# Patient Record
Sex: Female | Born: 1945 | Race: White | Hispanic: No | Smoking: Never smoker
Health system: Southern US, Community
[De-identification: ages and names within clinical notes are randomized; demographics above are authoritative.]

## PROBLEM LIST (undated history)

## (undated) DIAGNOSIS — I639 Cerebral infarction, unspecified: Secondary | ICD-10-CM

## (undated) DIAGNOSIS — R4702 Dysphasia: Secondary | ICD-10-CM

## (undated) DIAGNOSIS — I509 Heart failure, unspecified: Secondary | ICD-10-CM

## (undated) DIAGNOSIS — G0490 Encephalitis and encephalomyelitis, unspecified: Secondary | ICD-10-CM

## (undated) DIAGNOSIS — R4182 Altered mental status, unspecified: Secondary | ICD-10-CM

## (undated) DIAGNOSIS — I10 Essential (primary) hypertension: Secondary | ICD-10-CM

## (undated) DIAGNOSIS — J9600 Acute respiratory failure, unspecified whether with hypoxia or hypercapnia: Secondary | ICD-10-CM

## (undated) DIAGNOSIS — D649 Anemia, unspecified: Secondary | ICD-10-CM

## (undated) DIAGNOSIS — R569 Unspecified convulsions: Secondary | ICD-10-CM

## (undated) DIAGNOSIS — J189 Pneumonia, unspecified organism: Secondary | ICD-10-CM

## (undated) DIAGNOSIS — I4891 Unspecified atrial fibrillation: Secondary | ICD-10-CM

## (undated) DIAGNOSIS — E871 Hypo-osmolality and hyponatremia: Secondary | ICD-10-CM

## (undated) DIAGNOSIS — K589 Irritable bowel syndrome without diarrhea: Secondary | ICD-10-CM

## (undated) DIAGNOSIS — R739 Hyperglycemia, unspecified: Secondary | ICD-10-CM

## (undated) HISTORY — DX: Anemia, unspecified: D64.9

## (undated) HISTORY — DX: Dysphasia: R47.02

## (undated) HISTORY — DX: Irritable bowel syndrome, unspecified: K58.9

## (undated) HISTORY — DX: Heart failure, unspecified: I50.9

## (undated) HISTORY — DX: Acute respiratory failure, unspecified whether with hypoxia or hypercapnia: J96.00

## (undated) HISTORY — PX: TONSILLECTOMY: SUR1361

## (undated) HISTORY — DX: Altered mental status, unspecified: R41.82

## (undated) HISTORY — PX: CHOLECYSTECTOMY: SHX55

## (undated) HISTORY — PX: TUBAL LIGATION: SHX77

## (undated) HISTORY — DX: Hyperglycemia, unspecified: R73.9

## (undated) HISTORY — DX: Hypo-osmolality and hyponatremia: E87.1

## (undated) NOTE — *Deleted (*Deleted)
CARDIOLOGY CONSULT NOTE               Patient ID: [name] MRN: [id number] DOB/AGE: 12-22-1945 89 y.o.  Admit date: 03/28/2020 Referring Physician Dr. 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 Acute respiratory failure (HCC)    Secondary to aspiration pneumonia   . Altered mental status    Secondary to viral herpes simplex virus encephalitis  . Anemia   . Atrial fibrillation (HCC)   . Bilateral pneumonia   . CHF (congestive heart failure) (HCC)   . Dysphasia   . Encephalitis   . Hyperglycemia   . Hypertension   . Hyponatremia   . IBS (irritable bowel syndrome)   . Seizures (HCC)   . Stroke [facility])     Past Surgical History:  Procedure Laterality Date  . CHOLECYSTECTOMY    . COLECTOMY WITH COLOSTOMY CREATION/HARTMANN PROCEDURE N/A 03/12/2020   Procedure: COLECTOMY WITH COLOSTOMY CREATION/HARTMANN PROCEDURE;  Surgeon: Campbell Lerner, MD;  Location: [facility] ORS;  Service: General;  Laterality: N/A;  . COLONOSCOPY N/A 01/17/2020   Procedure: COLONOSCOPY;  Surgeon: Regis Bill, MD;  Location: [facility] ENDOSCOPY;  Service: Endoscopy;   Laterality: N/A;  . COLONOSCOPY WITH PROPOFOL N/A 12/31/2016   Procedure: COLONOSCOPY WITH PROPOFOL;  Surgeon: Scot Jun, MD;  Location: [facility] ENDOSCOPY;  Service: Endoscopy;  Laterality: N/A;  . ESOPHAGOGASTRODUODENOSCOPY (EGD) WITH PROPOFOL N/A 12/31/2016   Procedure: ESOPHAGOGASTRODUODENOSCOPY (EGD) WITH PROPOFOL;  Surgeon: Scot Jun, MD;  Location: [facility] ENDOSCOPY;  Service: Endoscopy;  Laterality: N/A;  . LAPAROTOMY N/A 03/12/2020   Procedure: EXPLORATORY LAPAROTOMY;  Surgeon: Campbell Lerner, MD;  Location: [facility] ORS;  Service: General;  Laterality: N/A;  . TONSILLECTOMY    . TUBAL LIGATION      Medications Prior to Admission  Medication Sig Dispense Refill Last Dose  . acetaminophen (TYLENOL) 325 MG tablet Take 650 mg by mouth every 6 (six) hours as needed.   prn at prn  . amiodarone (PACERONE) 200 MG tablet Take 1 tablet (200 mg total) by mouth daily.   Past Week at Unknown time  . amoxicillin-clavulanate (AUGMENTIN) 875-125 MG tablet Take 1 tablet by mouth 2 (two) times daily for 10 days. 20 tablet 0 Past Week at Unknown time  . apixaban (ELIQUIS) 5 MG TABS tablet Take 1 tablet (5 mg total) by mouth 2 (two) times daily. 60 tablet 0 Past Week at Unknown time  . dicyclomine (BENTYL) 10 MG capsule Take 10 mg by mouth every 6 (six) hours as needed.   prn at prn  . diltiazem (CARDIZEM CD) 240 MG 24 hr capsule Take 1 capsule (240 mg total) by mouth daily. 30 capsule 0 Past Week at Unknown time  . Ensure Max Protein (ENSURE MAX PROTEIN) LIQD Take 330 mLs (11  oz total) by mouth 2 (two) times daily.   Past Week at Unknown time  . folic acid (FOLVITE) 1 MG tablet Take 1 mg by mouth daily. Per tube once daily     Past Week at Unknown time  . furosemide (LASIX) 20 MG tablet Hold until followup with outpatient doctor, since your blood pressure is low, and you are already loosing lots fluids in your ostomy. 30 tablet  unknown at unknown  . guaiFENesin (MUCINEX) 600 MG 12 hr tablet Take 1  tablet (600 mg total) by mouth 2 (two) times daily.   Past Week at Unknown time  . hydrocortisone (ANUSOL-HC) 2.5 % rectal cream Apply 1 application topically 2 (two) times daily as needed.   prn at prn  . levothyroxine (SYNTHROID) 75 MCG tablet Take 75 mcg by mouth daily before breakfast.    Past Week at Unknown time  . metoprolol succinate (TOPROL-XL) 25 MG 24 hr tablet Take 2 tablets (50 mg total) by mouth daily.   Past Week at Unknown time  . Multiple Vitamin (MULTIVITAMIN WITH MINERALS) TABS tablet Take 1 tablet by mouth daily.   Past Week at Unknown time  . omeprazole (PRILOSEC) 20 MG capsule Take 20 mg by mouth daily.   Past Week at Unknown time  . polycarbophil (FIBERCON) 625 MG tablet Take 1 tablet (625 mg total) by mouth 3 (three) times daily.   Past Week at Unknown time  . potassium chloride (KLOR-CON) 10 MEQ tablet Take 10 mEq by mouth daily.   Past Week at Unknown time  . traMADol (ULTRAM) 50 MG tablet Take 50 mg by mouth every 6 (six) hours as needed.   prn at prn  . cephALEXin (KEFLEX) 500 MG capsule Take 500 mg by mouth 4 (four) times daily. (Patient not taking: Reported on 03/29/2020)   Not Taking at Unknown time   Social History   Socioeconomic History  . Marital status: Widowed    Spouse name: Not on file  . Number of children: 2  . Years of education: Not on file  . Highest education level: Not on file  Occupational History  . Occupation: medical transcript  Tobacco Use  . Smoking status: Never Smoker  . Smokeless tobacco: Never Used  Vaping Use  . Vaping Use: Never used  Substance and Sexual Activity  . Alcohol use: No  . Drug use: No  . Sexual activity: Never  Other Topics Concern  . Not on file  Social History Narrative  . Not on file   Social Determinants of Health   Financial Resource Strain: Low Risk   . Difficulty of Paying Living Expenses: Not hard at all  Food Insecurity: No Food Insecurity  . Worried About Programme researcher, broadcasting/film/video in the Last Year:  Never true  . Ran Out of Food in the Last Year: Never true  Transportation Needs: No Transportation Needs  . Lack of Transportation (Medical): No  . Lack of Transportation (Non-Medical): No  Physical Activity: Insufficiently Active  . Days of Exercise per Week: 2 days  . Minutes of Exercise per Session: 30 min  Stress: No Stress Concern Present  . Feeling of Stress : Not at all  Social Connections: Moderately Isolated  . Frequency of Communication with Friends and Family: More than three times a week  . Frequency of Social Gatherings with Friends and Family: More than three times a week  . Attends Religious Services: 1 to 4 times per year  . Active Member of  Clubs or Organizations: No  . Attends Banker Meetings: Never  . Marital Status: Widowed  Intimate Partner Violence: Not At Risk  . Fear of Current or Ex-Partner: No  . Emotionally Abused: No  . Physically Abused: No  . Sexually Abused: No    Family History  Problem Relation Age of Onset  . Prostate cancer Father   . Breast cancer Maternal Aunt   . Breast cancer Maternal Aunt   . Cerebral palsy Son       Review of systems complete and found to be negative unless listed above      PHYSICAL EXAM  General: Well developed, well nourished, in no acute distress HEENT:  Normocephalic and atramatic Neck:  No JVD.  Lungs: Clear bilaterally to auscultation and percussion. Heart: HRRR . Normal S1 and S2 without gallops or murmurs.  Abdomen: Bowel sounds are positive, abdomen soft and non-tender  Msk:  Back normal, normal gait. Normal strength and tone for age. Extremities: No clubbing, cyanosis or edema.   Neuro: Alert and oriented X 3. Psych:  Good affect, responds appropriately  Labs:   Lab Results  Component Value Date   WBC 10.4 03/30/2020   HGB 9.6 (L) 03/30/2020   HCT 29.6 (L) 03/30/2020   MCV 82.9 03/30/2020   PLT 267 03/30/2020    Recent Labs  Lab 03/28/20 2150 03/29/20 0431 03/30/20 0414   NA 129*   < > 135  K 4.3   < > 3.7  CL 98   < > 105  CO2 18*   < > 22  BUN 13   < > 8  CREATININE 0.53   < > 0.56  CALCIUM 8.4*   < > 8.1*  PROT 6.9  --   --   BILITOT 0.7  --   --   ALKPHOS 112  --   --   ALT 40  --   --   AST 41  --   --   GLUCOSE 84   < > 112*   < > = values in this interval not displayed.   Lab Results  Component Value Date   CKTOTAL 70 08/29/2011   CKMB 3.3 08/29/2011   TROPONINI 0.07 (HH) 07/05/2017    Lab Results  Component Value Date   CHOL 159 03/17/2019   CHOL 222 (H) 08/28/2011   Lab Results  Component Value Date   HDL 46 03/17/2019   HDL 55 08/28/2011   Lab Results  Component Value Date   LDLCALC 92 03/17/2019   LDLCALC 142 (H) 08/28/2011   Lab Results  Component Value Date   TRIG 103 03/17/2019   TRIG 127 08/28/2011   Lab Results  Component Value Date   CHOLHDL 3.5 03/17/2019   No results found for: LDLDIRECT    Radiology: DG Chest 1 View  Result Date: 03/13/2020 CLINICAL DATA:  Tachycardia EXAM: CHEST  1 VIEW COMPARISON:  02/22/2020 FINDINGS: There is cardiomegaly with small pleural effusions. No overt pulmonary edema. IMPRESSION: Cardiomegaly with small pleural effusions. Electronically Signed   By: Deatra Robinson M.D.   On: 03/13/2020 03:27   DG Chest 2 View  Result Date: 03/19/2020 CLINICAL DATA:  Pneumonia. EXAM: CHEST - 2 VIEW COMPARISON:  March 13, 2020. FINDINGS: Stable cardiomegaly. No pneumothorax is noted. Right-sided PICC line is unchanged in position. Mild bilateral pleural effusions are noted, right greater than left. Probable bibasilar atelectasis is noted as well. Bony thorax is unremarkable. IMPRESSION: Mild bilateral pleural effusions, right  greater than left. Probable bibasilar atelectasis. Electronically Signed   By: Lupita Raider M.D.   On: 03/19/2020 13:12   CT Angio Chest PE W/Cm &/Or Wo Cm  Result Date: 03/29/2020 CLINICAL DATA:  Chest pain with shortness of breath. History of bowel obstruction with  surgery times 2 weeks. Recent infection. Staples removed yesterday. EXAM: CT ANGIOGRAPHY CHEST CT ABDOMEN AND PELVIS WITH CONTRAST TECHNIQUE: Multidetector CT imaging of the chest was performed using the standard protocol during bolus administration of intravenous contrast. Multiplanar CT image reconstructions and MIPs were obtained to evaluate the vascular anatomy. Multidetector CT imaging of the abdomen and pelvis was performed using the standard protocol during bolus administration of intravenous contrast. CONTRAST:  OMNIPAQUE IOHEXOL 350 MG/ML SOLN COMPARISON:  CT dated 03/25/2020 FINDINGS: CTA CHEST FINDINGS Cardiovascular: Contrast injection is sufficient to demonstrate satisfactory opacification of the pulmonary arteries to the segmental level. There is no pulmonary embolus or evidence of right heart strain. The size of the main pulmonary artery is normal. There is significant cardiomegaly with biatrial enlargement. Mediastinum/Nodes: -- No mediastinal lymphadenopathy. -- No hilar lymphadenopathy. -- No axillary lymphadenopathy. -- No supraclavicular lymphadenopathy. -- Normal thyroid gland where visualized. -  Unremarkable esophagus. Lungs/Pleura: There are trace bilateral pleural effusions. There is atelectasis at the lung bases. There is mild interlobular septal thickening. There is no pneumothorax. The trachea is unremarkable. Musculoskeletal: No chest wall abnormality. No bony spinal canal stenosis. CT ABDOMEN and PELVIS FINDINGS Hepatobiliary: Low-attenuation nodules are again noted throughout the patient's left and right hepatic lobes as before. Status post cholecystectomy.There is mild intrahepatic biliary ductal dilatation, unchanged from prior study. Pancreas: Normal contours without ductal dilatation. No peripancreatic fluid collection. Spleen: Unremarkable. Adrenals/Urinary Tract: --Adrenal glands: Unremarkable. --Right kidney/ureter: No hydronephrosis or radiopaque kidney stones. --Left  kidney/ureter: No hydronephrosis or radiopaque kidney stones. --Urinary bladder: Unremarkable. Stomach/Bowel: --Stomach/Duodenum: No hiatal hernia or other gastric abnormality. Normal duodenal course and caliber. --Small bowel: There are mildly dilated hyperenhancing loops of small bowel in the left mid abdomen. There is a right lower quadrant ileostomy without evidence for obstruction. --Colon: Patient is status post subtotal colectomy. --Appendix: Surgically absent. Vascular/Lymphatic: Atherosclerotic calcification is present within the non-aneurysmal abdominal aorta, without hemodynamically significant stenosis. --No retroperitoneal lymphadenopathy. --No mesenteric lymphadenopathy. --No pelvic or inguinal lymphadenopathy. Reproductive: There is a probable fibroid uterus. Other: There is a fat containing lesion abutting the dome of the urinary bladder measuring approximately 2.7 cm. This is somewhat similar cross prior studies and may represent an area of fat necrosis. There is a midline abdominal wall incision that appears open. Inferiorly there is packing material. Superior to the umbilicus, there is a somewhat ill-defined 6.3 by 1.6 by 0.6 cm fluid collection deep to the midline incision. There is no rim enhancement of this collection. There is a trace amount of free fluid in the abdomen. There is no free air. Musculoskeletal. No acute displaced fractures. Review of the MIP images confirms the above findings. IMPRESSION: 1. No acute pulmonary embolism. 2. Trace bilateral pleural effusions with atelectasis. 3. Cardiomegaly with biatrial enlargement. There are findings of mild interstitial edema. 4. Mildly dilated hyperenhancing loops of small bowel in the left mid abdomen may represent ileus or enteritis. 5. Fluid collection deep to the midline incision superior to the umbilicus as detailed above. This is favored to represent a postoperative seroma or hematoma but has increased in size from prior study. A  developing abscess is not excluded. 6. Trace amount of free fluid in the  abdomen. 7. Fibroid uterus. 8. Fat containing lesion abutting the dome of the urinary bladder favored to represent an area of fat necrosis. Aortic Atherosclerosis (ICD10-I70.0). Electronically Signed   By: Katherine Mantle M.D.   On: 03/29/2020 00:03   CT Abdomen Pelvis W Contrast  Result Date: 03/29/2020 CLINICAL DATA:  Chest pain with shortness of breath. History of bowel obstruction with surgery times 2 weeks. Recent infection. Staples removed yesterday. EXAM: CT ANGIOGRAPHY CHEST CT ABDOMEN AND PELVIS WITH CONTRAST TECHNIQUE: Multidetector CT imaging of the chest was performed using the standard protocol during bolus administration of intravenous contrast. Multiplanar CT image reconstructions and MIPs were obtained to evaluate the vascular anatomy. Multidetector CT imaging of the abdomen and pelvis was performed using the standard protocol during bolus administration of intravenous contrast. CONTRAST:  OMNIPAQUE IOHEXOL 350 MG/ML SOLN COMPARISON:  CT dated 03/25/2020 FINDINGS: CTA CHEST FINDINGS Cardiovascular: Contrast injection is sufficient to demonstrate satisfactory opacification of the pulmonary arteries to the segmental level. There is no pulmonary embolus or evidence of right heart strain. The size of the main pulmonary artery is normal. There is significant cardiomegaly with biatrial enlargement. Mediastinum/Nodes: -- No mediastinal lymphadenopathy. -- No hilar lymphadenopathy. -- No axillary lymphadenopathy. -- No supraclavicular lymphadenopathy. -- Normal thyroid gland where visualized. -  Unremarkable esophagus. Lungs/Pleura: There are trace bilateral pleural effusions. There is atelectasis at the lung bases. There is mild interlobular septal thickening. There is no pneumothorax. The trachea is unremarkable. Musculoskeletal: No chest wall abnormality. No bony spinal canal stenosis. CT ABDOMEN and PELVIS FINDINGS  Hepatobiliary: Low-attenuation nodules are again noted throughout the patient's left and right hepatic lobes as before. Status post cholecystectomy.There is mild intrahepatic biliary ductal dilatation, unchanged from prior study. Pancreas: Normal contours without ductal dilatation. No peripancreatic fluid collection. Spleen: Unremarkable. Adrenals/Urinary Tract: --Adrenal glands: Unremarkable. --Right kidney/ureter: No hydronephrosis or radiopaque kidney stones. --Left kidney/ureter: No hydronephrosis or radiopaque kidney stones. --Urinary bladder: Unremarkable. Stomach/Bowel: --Stomach/Duodenum: No hiatal hernia or other gastric abnormality. Normal duodenal course and caliber. --Small bowel: There are mildly dilated hyperenhancing loops of small bowel in the left mid abdomen. There is a right lower quadrant ileostomy without evidence for obstruction. --Colon: Patient is status post subtotal colectomy. --Appendix: Surgically absent. Vascular/Lymphatic: Atherosclerotic calcification is present within the non-aneurysmal abdominal aorta, without hemodynamically significant stenosis. --No retroperitoneal lymphadenopathy. --No mesenteric lymphadenopathy. --No pelvic or inguinal lymphadenopathy. Reproductive: There is a probable fibroid uterus. Other: There is a fat containing lesion abutting the dome of the urinary bladder measuring approximately 2.7 cm. This is somewhat similar cross prior studies and may represent an area of fat necrosis. There is a midline abdominal wall incision that appears open. Inferiorly there is packing material. Superior to the umbilicus, there is a somewhat ill-defined 6.3 by 1.6 by 0.6 cm fluid collection deep to the midline incision. There is no rim enhancement of this collection. There is a trace amount of free fluid in the abdomen. There is no free air. Musculoskeletal. No acute displaced fractures. Review of the MIP images confirms the above findings. IMPRESSION: 1. No acute pulmonary  embolism. 2. Trace bilateral pleural effusions with atelectasis. 3. Cardiomegaly with biatrial enlargement. There are findings of mild interstitial edema. 4. Mildly dilated hyperenhancing loops of small bowel in the left mid abdomen may represent ileus or enteritis. 5. Fluid collection deep to the midline incision superior to the umbilicus as detailed above. This is favored to represent a postoperative seroma or hematoma but has increased in size from prior  study. A developing abscess is not excluded. 6. Trace amount of free fluid in the abdomen. 7. Fibroid uterus. 8. Fat containing lesion abutting the dome of the urinary bladder favored to represent an area of fat necrosis. Aortic Atherosclerosis (ICD10-I70.0). Electronically Signed   By: Katherine Mantle M.D.   On: 03/29/2020 00:03   CT ABDOMEN PELVIS W CONTRAST  Result Date: 03/25/2020 CLINICAL DATA:  Postop 1 week subtotal colectomy. Purulence drainage from wound. Evaluate for abscess. colectomy for bowel obstruction. EXAM: CT ABDOMEN AND PELVIS WITH CONTRAST TECHNIQUE: Multidetector CT imaging of the abdomen and pelvis was performed using the standard protocol following bolus administration of intravenous contrast. CONTRAST:  OMNIPAQUE IOHEXOL 300 MG/ML  SOLN COMPARISON:  CT 03/19/2020 FINDINGS: Lower chest: Improving bilateral pleural effusions. Moderate effusion remains on the RIGHT. Near complete resolution of LEFT effusion. There is passive atelectasis in the RIGHT lower lobe. Hepatobiliary: Several hypodense lesions in liver unchanged consistent with cysts. Postcholecystectomy. Pancreas: Pancreas is normal. No ductal dilatation. No pancreatic inflammation. Spleen: Normal spleen Adrenals/urinary tract: Adrenal glands and kidneys are normal. The ureters and bladder normal. Stomach/Bowel: Stomach is distended with oral contrast. There is mild dilatation of the proximal small bowel 3.2 cm similar comparison CT. Contrast flows in the mid small  bowel but does not reach the ileostomy in the course of the study. There is no transition point. RIGHT abdominal wall ileostomy without complicating features. Vascular/Lymphatic: Abdominal aorta is normal caliber with atherosclerotic calcification. There is no retroperitoneal or periportal lymphadenopathy. No pelvic lymphadenopathy. Reproductive: Uterus normal Other: Interval reduction in intraperitoneal free fluid seen on comparison exam. Small amount fluid remains in the RIGHT abdominal peritoneal space. Near complete resolution of fluid within the pelvis. Small subcutaneous fluid collection along the inferior margin of the surgical wound staples is increased in diameter compared to prior. This triangular collection measures 2.3 x 3.0 cm compared to 1.2 x 1.5 cm on comparison exam. This collection is superficial to the muscle layer does not appear to communicate with the peritoneal space. Musculoskeletal: No aggressive osseous lesion. IMPRESSION: 1. Increased organization of fluid collection along the inferior margin of the midline ventral wound. This collection does not appear to communicate with the peritoneal space. 2. Persistent distended stomach and dilated proximal small bowel with no obstructing lesion or transition point. Findings continue to suggest adynamic ileus. No evidence obstruction of the RIGHT lower quadrant ileostomy. 3. Interval decrease in intraperitoneal free fluid. 4. Interval decrease in pleural effusions. Electronically Signed   By: Genevive Bi M.D.   On: 03/25/2020 18:45   CT ABDOMEN PELVIS W CONTRAST  Result Date: 03/19/2020 CLINICAL DATA:  Worsening leukocytosis 1 week following subtotal colectomy. Abdominal abscess/infection suspected. EXAM: CT ABDOMEN AND PELVIS WITH CONTRAST TECHNIQUE: Multidetector CT imaging of the abdomen and pelvis was performed using the standard protocol following bolus administration of intravenous contrast. CONTRAST:  OMNIPAQUE IOHEXOL 300  MG/ML  SOLN COMPARISON:  Abdominopelvic CT 03/11/2020 and 12/21/2019. FINDINGS: Lower chest: Slight interval enlargement of moderate right and small left pleural effusions with associated worsening atelectasis dependently in both lung bases. Stable cardiomegaly and coronary artery atherosclerosis. No significant pericardial fluid. Hepatobiliary: Stable appendix cysts. Stable mild extrahepatic biliary dilatation post cholecystectomy. Pancreas: Unremarkable. No pancreatic ductal dilatation or surrounding inflammatory changes. Spleen: Normal in size without focal abnormality. Adrenals/Urinary Tract: Both adrenal glands appear normal. Stable mild bilateral renal cortical thinning. No renal mass, urinary tract calculus or hydronephrosis. The bladder appears normal. Stomach/Bowel: Enteric contrast was administered and  extends to the ileostomy in the right mid abdominal wall. There is mild diffuse dilatation of the stomach and small bowel without focal wall thickening or enteric contrast extravasation. Subtotal colectomy has been performed without evidence of suture breakdown at the [facility]. Vascular/Lymphatic: There are no enlarged abdominal or pelvic lymph nodes. Aortic and branch vessel atherosclerosis. The portal, superior mesenteric and splenic veins are patent. Reproductive: Enhancing mass anteriorly in the lower uterine segment measuring 4.4 x 3.1 cm on image 81/2, grossly stable from prior CTs and consistent with an anterior fibroid. No suspicious adnexal findings. Other: Small amount of pelvic and interloop ascites without suspicious peritoneal enhancement or focal extraluminal fluid collection. No free intraperitoneal air. Musculoskeletal: No acute or significant osseous findings. IMPRESSION: 1. Interval subtotal colectomy and ileostomy. Mild diffuse dilatation of the stomach and small bowel without focal wall thickening or enteric contrast extravasation, suggesting adynamic ileus. 2. Small amount of pelvic  and interloop ascites without suspicious peritoneal enhancement or focal extraluminal fluid collection. 3. Slight interval enlargement of right greater than left pleural effusions with associated worsening atelectasis dependently in both lung bases. 4. Uterine fibroid. 5. Aortic Atherosclerosis (ICD10-I70.0). Electronically Signed   By: Carey Bullocks M.D.   On: 03/19/2020 12:20   CT ABDOMEN PELVIS W CONTRAST  Result Date: 03/11/2020 CLINICAL DATA:  50 year old female with abdominal distension. EXAM: CT ABDOMEN AND PELVIS WITH CONTRAST TECHNIQUE: Multidetector CT imaging of the abdomen and pelvis was performed using the standard protocol following bolus administration of intravenous contrast. CONTRAST:  OMNIPAQUE IOHEXOL 300 MG/ML  SOLN COMPARISON:  CT abdomen pelvis dated 12/21/2019. FINDINGS: Lower chest: Trace left and small right pleural effusions. Patchy right lung base consolidation, likely atelectasis. Pneumonia is not excluded clinical correlation is recommended. There is moderate cardiomegaly with biatrial dilatation. There is retrograde flow of contrast from the right atrium into the IVC suggestive of a degree of right heart dysfunction. There is calcification of the mitral annulus. No intra-abdominal free air. Trace free fluid in the pelvis. Hepatobiliary: There is mild irregularity of the liver contour suggestive of early changes of cirrhosis. Bilobed or 2 adjacent cysts in the left lobe of the liver with combined dimension of 2 cm. No intrahepatic biliary ductal dilatation. Cholecystectomy. No retained calcified stone noted in the central CBD. Pancreas: No acute findings. Spleen: Normal in size without focal abnormality. Adrenals/Urinary Tract: The adrenal glands unremarkable. Mild bilateral parenchyma atrophy. There is no hydronephrosis on either side. There is symmetric enhancement and excretion of contrast by both kidneys. The visualized ureters and urinary bladder appear unremarkable.  Stomach/Bowel: There is sigmoid diverticulosis with muscular hypertrophy. There is moderate diffuse colonic distension with air extending to the level of the sigmoid colon suggestive of a degree of obstruction of the sigmoid colon, likely related to muscular hypertrophy or stricture. Underlying mass is not excluded. Clinical correlation and follow-up recommended. Scattered tiny pockets of air along the wall of the cecum and proximal colon likely mixed with the colonic content and less likely pneumatosis. Correlation with lactic acid recommended to exclude bowel ischemia. There is a small hiatal hernia. No evidence of small-bowel obstruction. Vascular/Lymphatic: Mild aortoiliac atherosclerotic disease. The IVC is unremarkable. No portal venous gas. There is no adenopathy. Reproductive: The uterus is grossly unremarkable. Other: Small fat containing umbilical hernia. Musculoskeletal: No acute or significant osseous findings. IMPRESSION: 1. Moderate diffuse colonic distension likely related to a degree of obstruction of the sigmoid colon, secondary to muscular hypertrophy or stricture. Underlying mass is not  excluded. Clinical correlation and follow-up recommended. 2. Sigmoid diverticulosis with muscular hypertrophy. No active inflammatory changes. 3. Moderate cardiomegaly with biatrial dilatation. 4. Trace left and small right pleural effusions. Patchy right lung base consolidation, likely atelectasis. Pneumonia is not excluded. 5. Aortic Atherosclerosis (ICD10-I70.0). Electronically Signed   By: Elgie Collard M.D.   On: 03/11/2020 19:45   DG Chest Port 1 View  Result Date: 03/28/2020 CLINICAL DATA:  Questionable sepsis, sharp 5/10 chest pain, shortness of breath history of bowel obstruction, now post subtotal colectomy EXAM: PORTABLE CHEST 1 VIEW COMPARISON:  Radiograph 03/19/2020 FINDINGS: There is diffuse hazy interstitial opacity in the lungs with a mid to lower lung predominance and some faint septal  thickening as well as central vascular congestion. More hazy opacities present in the right infrahilar lung. Small bilateral effusions are present, likely diminished from comparison accounting for differences in technique. No pneumothorax. Cardiomegaly similar to prior counting for differences in technique. The osseous structures appear diffusely demineralized which may limit detection of small or nondisplaced fractures. No acute osseous abnormality or suspicious osseous lesion. Degenerative changes are present in the imaged spine and shoulders. Soft tissues unremarkable. Telemetry leads overlie the chest. IMPRESSION: 1. Appearance could suggest some mild CHF/volume overload with cardiomegaly and features of interstitial with trace bilateral effusions. 2. More hazy opacity in the right infrahilar lung is favored to reflect some developing alveolar edema though early airspace disease is not excluded in the setting of sepsis. Electronically Signed   By: Kreg Shropshire M.D.   On: 03/28/2020 22:16   DG Chest Port 1 View  Result Date: 03/13/2020 CLINICAL DATA:  Dyspnea EXAM: PORTABLE CHEST 1 VIEW COMPARISON:  03/14/2019 FINDINGS: Cardiac shadow remains enlarged. Aortic calcifications are again seen. Small effusions are noted bilaterally right slightly greater than left. Mild left basilar atelectasis is seen. Mild central vascular congestion is noted as well. IMPRESSION: Small effusions right greater than left. Mild left basilar atelectasis is seen. Mild vascular congestion is noted as well. Electronically Signed   By: Alcide Clever M.D.   On: 03/13/2020 18:05   DG Abd 2 Views  Result Date: 03/11/2020 CLINICAL DATA:  Abdominal pain EXAM: ABDOMEN - 2 VIEW COMPARISON:  December 12, 2019 FINDINGS: There is marked gaseous dilation of loops colon. The cecum measures approximately 13 cm. No definitive free air. There are nonspecific air-fluid levels on upright radiographs. Small amount of air seen within the rectum.  Bibasilar heterogeneous opacities, nonspecific. Surgical clips project over the upper abdomen. Degenerative changes of the lower lumbar spine. IMPRESSION: 1. Marked gaseous dilation of loops of colon. Small amount of air seen within the rectum. Findings are concerning for colonic obstruction versus ileus. Recommend further evaluation with dedicated contrast enhanced CT. 2.  Bibasilar heterogeneous opacities, nonspecific. Electronically Signed   By: Meda Klinefelter MD   On: 03/11/2020 15:37   DG BE (COLON)W SINGLE CM (SOL OR THIN BA)  Result Date: 03/12/2020 CLINICAL DATA:  Evaluate for colonic obstruction EXAM: SINGLE CONTRAST ENEMA TECHNIQUE: Initial scout AP supine abdominal image obtained. Contrast was introduced into the colon in a retrograde fashion and spot images were obtained. Dilute Omnipaque 300 contrast media was used. FLUOROSCOPY TIME:  Fluoroscopy Time:  1.2 minutes Radiation Exposure Index (if provided by the fluoroscopic device): 28.4 mGy Number of Acquired Spot Images: 12 COMPARISON:  CT 03/11/2020 FINDINGS: Single contrast enema was performed utilizing dilute Omnipaque 300. Contrast filled the rectum and distal sigmoid colon. There is abrupt luminal obstruction the level of the  mid to distal sigmoid colon corresponding to site of high-grade luminal narrowing seen on CT. No contrast was able to traverse this level. No extraluminal contrast was visualized. Patient tolerated the procedure well. IMPRESSION: Complete colonic obstruction at the level of the mid to distal sigmoid colon. These results were called by telephone at the conclusion of the exam on 03/12/2020 at 10:53 a.m. to provider Lynden Oxford, PA, who verbally acknowledged these results. Electronically Signed   By: Duanne Guess D.O.   On: 03/12/2020 11:39   [place] EKG SITE RITE  Result Date: 03/13/2020 If Site Rite image not attached, placement could not be confirmed due to current cardiac rhythm.   EKG: Atrial fibrillation  with controlled  ASSESSMENT AND PLAN:  Atrial fibrillation Postop abdominal surgery Wound infection History of diastolic heart failure Hypertension Seizure disorder Previous CVA history of pneumonia . Plan 1 Sepsis therapy with antibiotics for postop wound infection 2 low-grade temp recommend medical therapy 3 hypotension will consider midodrine 4 agree with DVT prophylaxis 5 consider ID involvement for sepsis therapy 6 continue therapy for chronic diastolic congestive heart failure 7 recommend correct electrolytes 8 consider adding digoxin if additional rate control necessary  Signed: Alwyn Pea MD 03/30/2020, 3:36 PM

## (undated) NOTE — *Deleted (*Deleted)
[facility] Emergency Department Provider Note  ____________________________________________  Time seen: Approximately 11:42 PM  I have reviewed the triage vital signs and the nursing notes.   HISTORY  Chief Complaint Shortness of Breath and Chest Pain   HPI [name] is a 8 y.o. female  with history of A. 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 Patient recently admitted for severe sepsis from 10/27 to 11/2. Patient has noticed swelling of bilateral LE and feet. This evening as she laid down to sleep she started to feel SOB and developed chest tightness. No fever, cough, abd pain, N/V/D. SOB is better with her sitting up.   PMH A. Fib HTN diastolic CHF Colectomy diverticulitis  Allergies Patient has no allergy information on record.  FH Father (Deceased)  Prostate cancer  Maternal Aunt Breast cancer  Maternal Aunt Breast cancer  Son Cerebral palsy     Social History Tobacco Use  . Smoking status: Never Smoker  . Smokeless tobacco: Never Used  Vaping Use  . Vaping Use: Never used  Substance Use Topics  . Alcohol use: No  . Drug use: No     Review of Systems  Constitutional: Negative for fever. Eyes: Negative for visual changes. ENT: Negative for sore throat. Neck: No neck pain  Cardiovascular: Negative for chest pain. + chest tightness Respiratory: + shortness of breath. Gastrointestinal: Negative for abdominal pain, vomiting or diarrhea. Genitourinary: Negative for dysuria. Musculoskeletal: Negative for back pain. Skin: Negative for rash. Neurological: Negative for headaches, weakness or numbness. Psych: No SI or HI  ____________________________________________   PHYSICAL EXAM:  VITAL  SIGNS: *** Constitutional: Alert and oriented. Well appearing and in no apparent distress. HEENT:      Head: Normocephalic and atraumatic.         Eyes: Conjunctivae are normal. Sclera is non-icteric.       Mouth/Throat: Mucous membranes are moist.       Neck: Supple with no signs of meningismus. Cardiovascular: Irregularly irregular rhythm with mildly tachycardic rate Respiratory: Normal respiratory effort. Lungs are clear to auscultation bilaterally. No wheezes, crackles, or rhonchi.  Gastrointestinal: Soft, non tender, and non distended. Surgical wound looks well Musculoskeletal: 2+ pitting edema of b/l LE Neurologic: Normal speech and language. Face is symmetric. Moving all extremities. No gross focal neurologic deficits are appreciated. Skin: Skin is warm, dry and intact. No rash noted. Psychiatric: Mood and affect are normal. Speech and behavior are normal.  ____________________________________________   LABS (all labs ordered are listed, but only abnormal results are displayed)  Labs Reviewed  CBC WITH DIFFERENTIAL/PLATELET  COMPREHENSIVE METABOLIC PANEL  BRAIN NATRIURETIC PEPTIDE  MAGNESIUM  URINALYSIS, COMPLETE (UACMP) WITH MICROSCOPIC  TROPONIN I (HIGH SENSITIVITY)   ____________________________________________  EKG  ***  ____________________________________________  RADIOLOGY  I have personally reviewed the images performed during this visit and I agree with the Radiologist's read.   Interpretation by Radiologist:  No results found.    ____________________________________________   PROCEDURES  Procedure(s) performed:yes *** Procedures Critical Care performed: *** None ____________________________________________   INITIAL IMPRESSION / ASSESSMENT AND PLAN / ED COURSE  20 y.o. female  with history of A. fib on Eliquis and amiodarone, HTN, diastolic CHF, status post recent subtotal colectomy on 03/12/20 for colonic obstruction from stricture from  diverticulitis, with postop complication of wound  infection who [facility] [employer] for SOB and chest tightness.  Patient arrives in A. fib with rate in the low 100s, afebrile with normal blood pressure.  Old medical records reviewed showing recent admission for almost a week in the setting of severe sepsis of unknown source for which she received a large amount of fluids.  She does look volume overloaded with edema in her bilateral lower extremity.  Differential diagnoses including CHF exacerbation versus viral illness versus pneumonia versus ACS versus PE versus sepsis.  Patient placed on telemetry for close monitoring.  Will get CBC, CMP, troponin, BNP, EKG, chest x-ray, urinalysis, Covid/flu swab.       _____________________________________________ Please note:  Patient was evaluated in Emergency Department today for the symptoms described in the history of present illness. Patient was evaluated in the context of the global COVID-19 pandemic, which necessitated consideration that the patient might be at risk for infection with the SARS-CoV-2 virus that causes COVID-19. Institutional protocols and algorithms that pertain to the evaluation of patients at risk for COVID-19 are in a state of rapid change based on information released by regulatory bodies including the CDC and federal and state organizations. These policies and algorithms were followed during the patient's care in the ED.  Some ED evaluations and interventions may be delayed as a result of limited staffing during the pandemic.   [place] Controlled Substance Database was reviewed by me. ____________________________________________   FINAL CLINICAL IMPRESSION(S) / ED DIAGNOSES   Final diagnoses:  None      NEW MEDICATIONS STARTED DURING THIS VISIT:  ED Discharge Orders    None       Note:  This document was prepared using Dragon voice recognition software and may include unintentional dictation errors.

---

## 2002-06-09 ENCOUNTER — Encounter: Admission: RE | Admit: 2002-06-09 | Discharge: 2002-06-09 | Payer: Self-pay

## 2005-02-05 ENCOUNTER — Ambulatory Visit: Payer: Self-pay | Admitting: Internal Medicine

## 2006-02-17 ENCOUNTER — Ambulatory Visit: Payer: Self-pay | Admitting: Internal Medicine

## 2006-11-10 ENCOUNTER — Ambulatory Visit: Payer: Self-pay | Admitting: Internal Medicine

## 2006-11-12 ENCOUNTER — Ambulatory Visit: Payer: Self-pay | Admitting: Internal Medicine

## 2007-08-19 ENCOUNTER — Ambulatory Visit: Payer: Self-pay | Admitting: Gastroenterology

## 2007-09-03 ENCOUNTER — Ambulatory Visit: Payer: Self-pay | Admitting: Gastroenterology

## 2008-03-22 ENCOUNTER — Ambulatory Visit: Payer: Self-pay | Admitting: Internal Medicine

## 2009-04-17 ENCOUNTER — Ambulatory Visit: Payer: Self-pay | Admitting: Internal Medicine

## 2010-05-14 ENCOUNTER — Ambulatory Visit: Payer: Self-pay | Admitting: Internal Medicine

## 2010-12-29 ENCOUNTER — Emergency Department: Payer: Self-pay | Admitting: Internal Medicine

## 2010-12-30 ENCOUNTER — Emergency Department: Payer: Self-pay | Admitting: Unknown Physician Specialty

## 2010-12-31 ENCOUNTER — Encounter: Payer: Self-pay | Admitting: Internal Medicine

## 2010-12-31 ENCOUNTER — Emergency Department (HOSPITAL_COMMUNITY): Payer: Medicare Other

## 2010-12-31 ENCOUNTER — Inpatient Hospital Stay (HOSPITAL_COMMUNITY): Payer: Medicare Other

## 2010-12-31 ENCOUNTER — Inpatient Hospital Stay (HOSPITAL_COMMUNITY)
Admission: EM | Admit: 2010-12-31 | Discharge: 2011-02-05 | DRG: 003 | Disposition: A | Discharge status: Skilled Nursing Facility | Payer: Medicare Other | Attending: Pulmonary Disease | Admitting: Pulmonary Disease

## 2010-12-31 DIAGNOSIS — N390 Urinary tract infection, site not specified: Secondary | ICD-10-CM | POA: Diagnosis present

## 2010-12-31 DIAGNOSIS — R5381 Other malaise: Secondary | ICD-10-CM | POA: Diagnosis present

## 2010-12-31 DIAGNOSIS — I498 Other specified cardiac arrhythmias: Secondary | ICD-10-CM | POA: Diagnosis not present

## 2010-12-31 DIAGNOSIS — J9600 Acute respiratory failure, unspecified whether with hypoxia or hypercapnia: Secondary | ICD-10-CM | POA: Diagnosis not present

## 2010-12-31 DIAGNOSIS — E236 Other disorders of pituitary gland: Secondary | ICD-10-CM | POA: Diagnosis present

## 2010-12-31 DIAGNOSIS — R4789 Other speech disturbances: Secondary | ICD-10-CM | POA: Diagnosis present

## 2010-12-31 DIAGNOSIS — J690 Pneumonitis due to inhalation of food and vomit: Secondary | ICD-10-CM | POA: Diagnosis present

## 2010-12-31 DIAGNOSIS — E876 Hypokalemia: Secondary | ICD-10-CM | POA: Diagnosis present

## 2010-12-31 DIAGNOSIS — Z79899 Other long term (current) drug therapy: Secondary | ICD-10-CM

## 2010-12-31 DIAGNOSIS — J9819 Other pulmonary collapse: Secondary | ICD-10-CM | POA: Diagnosis not present

## 2010-12-31 DIAGNOSIS — R6510 Systemic inflammatory response syndrome (SIRS) of non-infectious origin without acute organ dysfunction: Secondary | ICD-10-CM | POA: Diagnosis present

## 2010-12-31 DIAGNOSIS — K047 Periapical abscess without sinus: Secondary | ICD-10-CM | POA: Diagnosis present

## 2010-12-31 DIAGNOSIS — I059 Rheumatic mitral valve disease, unspecified: Secondary | ICD-10-CM | POA: Diagnosis present

## 2010-12-31 DIAGNOSIS — E162 Hypoglycemia, unspecified: Secondary | ICD-10-CM | POA: Diagnosis not present

## 2010-12-31 DIAGNOSIS — R1313 Dysphagia, pharyngeal phase: Secondary | ICD-10-CM | POA: Diagnosis not present

## 2010-12-31 DIAGNOSIS — R0902 Hypoxemia: Secondary | ICD-10-CM | POA: Diagnosis present

## 2010-12-31 DIAGNOSIS — D62 Acute posthemorrhagic anemia: Secondary | ICD-10-CM | POA: Diagnosis not present

## 2010-12-31 DIAGNOSIS — B004 Herpesviral encephalitis: Principal | ICD-10-CM | POA: Diagnosis present

## 2010-12-31 LAB — COMPREHENSIVE METABOLIC PANEL
ALT: 24 U/L (ref 0–35)
CO2: 24 mEq/L (ref 19–32)
Calcium: 8.1 mg/dL — ABNORMAL LOW (ref 8.4–10.5)
Creatinine, Ser: 0.69 mg/dL (ref 0.50–1.10)
GFR calc Af Amer: >60 mL/min (ref >60)
GFR calc non Af Amer: >60 mL/min (ref >60)
Glucose, Bld: 101 mg/dL — ABNORMAL HIGH (ref 70–99)
Sodium: 123 mEq/L — ABNORMAL LOW (ref 135–145)
Total Protein: 6.4 g/dL (ref 6.0–8.3)

## 2010-12-31 LAB — URINALYSIS, ROUTINE W REFLEX MICROSCOPIC
Leukocytes, UA: NEGATIVE
Nitrite: NEGATIVE
Protein, ur: NEGATIVE mg/dL
Specific Gravity, Urine: 1.019 (ref 1.005–1.030)
Urobilinogen, UA: 1 mg/dL (ref 0.0–1.0)

## 2010-12-31 LAB — CBC
HCT: 37.1 % (ref 36.0–46.0)
Hemoglobin: 13.2 g/dL (ref 12.0–15.0)
MCH: 28.4 pg (ref 26.0–34.0)
MCHC: 35.6 g/dL (ref 30.0–36.0)
MCV: 79.8 fL (ref 78.0–100.0)
Platelets: 125 10*3/uL — ABNORMAL LOW (ref 150–400)
RBC: 4.65 MIL/uL (ref 3.87–5.11)
RDW: 13 % (ref 11.5–15.5)
WBC: 12.8 10*3/uL — ABNORMAL HIGH (ref 4.0–10.5)

## 2010-12-31 LAB — DIFFERENTIAL
Basophils Absolute: 0 10*3/uL (ref 0.0–0.1)
Basophils Relative: 0 % (ref 0–1)
Eosinophils Absolute: 0 10*3/uL (ref 0.0–0.7)
Eosinophils Relative: 0 % (ref 0–5)
Lymphocytes Relative: 13 % (ref 12–46)
Lymphs Abs: 1.6 10*3/uL (ref 0.7–4.0)
Monocytes Absolute: 1 10*3/uL (ref 0.1–1.0)
Monocytes Relative: 8 % (ref 3–12)
Neutro Abs: 10.2 10*3/uL — ABNORMAL HIGH (ref 1.7–7.7)
Neutrophils Relative %: 79 % — ABNORMAL HIGH (ref 43–77)

## 2010-12-31 LAB — POCT I-STAT 3, ART BLOOD GAS (G3+)
Acid-Base Excess: 1 mmol/L (ref 0.0–2.0)
O2 Saturation: 95 %
Patient temperature: 98.6
TCO2: 24 mmol/L (ref 0–100)

## 2010-12-31 LAB — CREATININE, URINE, RANDOM: Creatinine, Urine: 71.79 mg/dL

## 2010-12-31 LAB — URINE MICROSCOPIC-ADD ON

## 2010-12-31 LAB — RAPID URINE DRUG SCREEN, HOSP PERFORMED
Amphetamines: NOT DETECTED
Barbiturates: NOT DETECTED
Benzodiazepines: NOT DETECTED
Cocaine: NOT DETECTED
Tetrahydrocannabinol: NOT DETECTED

## 2010-12-31 LAB — AMMONIA: Ammonia: 28 umol/L (ref 11–60)

## 2010-12-31 MED ORDER — ACETAMINOPHEN 325 MG PO TABS
ORAL_TABLET | ORAL | Status: AC
  Filled 2010-12-31: qty 3

## 2010-12-31 NOTE — H&P (Addendum)
Hospital Admission Note Date: 12/31/2010  Patient name: [name] Medical record number: [id number] Date of birth: 1945/11/03 Age: 65 y.o. Gender: female PCP: Dr. Candelaria Stagers of [facility] in [place]. Her oral surgeon is Dr. Christella Hartigan.  Medical Service: Internal Medicine Teaching Service   Attending physician: Dr. Coralee Pesa First Contact: Dr. Quentin Ore Pager: [phone] after July 31 first contact is Dr. Janalyn Harder pager [phone] Second Contact: Dr. Dineen Kid: Bethel Born [phone] After Hours: First Contact Pager: [phone]  Second Contact Pager: [phone]   Chief Complaint: Altered mental status and fever  History of Present Illness: Patient is a 65 y.o. female with a PMHx of unknown. She presents with 4 days of fever nausea weakness and altered mental status x3 days.  Note patient was acutely delirious during history patient was taken mainly from her son with some history contributed by the patient.    Approximately Saturday Ms. Brocker began to feel ill with fever, nausea, weakness, tiredness, and headache at the top of the head. She also endorsed vomiting last week.  Also endorsed fevers and chills, left flank pain, anorexia and difficulty sleeping. She endorses 2 episodes of diarrhea on Saturday and Sunday. The symptoms progressed and she presented to [facility] on Sunday. There she was diagnosed with a urinary tract infection and given a prescription for ciprofloxacin 250 mg twice a day x7 days and Zofran 4 mg every 4 hours. She only took one pill of Cipro and 3 pills of Zofran. Ms. Keng then became confused on Sunday evening with decreased awareness and since of time.    On Monday and due to her worsening condition and an acquaintance compared to [facility] again there head CT and lumbar puncture were performed. CT scan was negative for acute intracranial abnormality. GI abdominal series was negative for free air or obstruction. As these  are negative she was discharged. Blood cultures from this visit are negative to date x2. CSF cultures negative and Gram stain showed no organisms or white blood cells.  On Tuesday morning before admission and was in the hospital she was evaluated by her oral surgeon Dr. Christella Hartigan, as she states she has an abscess in her tooth. And per report from the family he stated that he did not believe the abscess was a cause of Ms. Cheek's symptoms. He declined removal of her abscessed tooth during her acute state of AMS.  Ms. Aye currently denies flank pain, nausea, vomiting, fevers or chills, any pain specifically head pain, chest pain, abdominal pain, or tooth pain.  She denies numbness. She denies any rash or bug bite that she can remember. She denies shortness of breath.   Current Outpatient Medications: Ibuprofen PRN Krill Oil  Allergies: NKDA. patient endorses an intolerance to prednisone as he gets stomach upset  Past Medical History: History of heart murmur since childhood. Unable to assess further given AMS A report from [facility] records she has had renal calculi. Dieting. Past Surgical History: Cholecystectomy Tubal ligation  Family History: Patient could not give family history beyond stating that her ancestors are long lived   Social History  Marital Status: Widowed female who lives alone in [place]. Her son Titiana Severa lives nearby in [place] as well. His phone number is [phone]. She has another son with schizoaffective disorder over whom she has medical power of attorney. 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  Smoking status: Denies  Alcohol Use: Denies Drug Use: Denies  Review of Systems: Pertinent items are noted in HPI.  Vital Signs: T: 102.4 P: 91 BP: 147/72 RR: 18 O2 sat: 96% on Room air  Physical Exam: General: Vital signs reviewed and noted.  Well-developed, well-nourished, in no acute distress; patient is altered and can only follow very basic instructions. She displays inattention. Head: Normocephalic, atraumatic. Eyes: PERRL, EOMI, No signs of anemia or jaundince. Nose: Mucous membranes moist, not inflammed, nonerythematous. Throat: Oropharynx nonerythematous, no exudate appreciated. There is a cracked molar in the upper left tooth 2nd from the back Neck: No deformities, masses, or tenderness noted.Supple, No carotid Bruits, no JVD. Lungs: Normal respiratory effort. Clear to auscultation BL without crackles or wheezes. Heart: RRR. S1 and S2 normal without gallop, 3+ systolic murmur heard over the entire precordium. No rubs appreciated Abdomen: BS normoactive. Soft, Nondistended, non-tender. No masses or organomegaly. Extremities: No pretibial edema. Neurologic: A&O X2.5 she stated the date was July 30. She is not oriented to situation. CN II - XII are grossly intact. Motor strength is 5/5 in the all 4 extremities. Deep tendon reflexes were 3+ (slightly hyper reflexive). Muscle tone was increased. The rest of the exam was limited due to the patient's altered status. Skin: No visible rashes, scars. Skin was warm to the touch and dry  Lab results:   WBC                                      12.8       h      4.0-10.5         K/uL  RBC                                      4.65              3.87-5.11        MIL/uL  Hemoglobin (HGB)                         13.2              12.0-15.0        g/dL  Hematocrit (HCT)                         37.1              36.0-46.0        %  MCV                                      79.8              78.0-100.0       fL  MCH -                                    28.4              26.0-34.0        pg  MCHC  35.6              30.0-36.0        g/dL  RDW                                      13.0              11.5-15.5        %  Platelet Count (PLT)                     125         l      150-400          K/uL  Neutrophils, %                           79         h      43-77            %  Lymphocytes, %                           13                12-46            %  Monocytes, %                             8                 3-12             %  Eosinophils, %                           0                 0-5              %  Basophils, %                             0                 0-1              %  Neutrophils, Absolute                    10.2       h      1.7-7.7          K/uL  Lymphocytes, Absolute                    1.6               0.7-4.0          K/uL  Monocytes, Absolute                      1.0               0.1-1.0          K/uL  Eosinophils, Absolute  0.0               0.0-0.7          K/uL  Basophils, Absolute                      0.0               0.0-0.1          K/uL   Sodium (NA)                              123        l      135-145          mEq/L  Potassium (K)                            3.2        l      3.5-5.1          mEq/L  Chloride                                 89         l      96-112           mEq/L  CO2                                      24                19-32            mEq/L  Glucose                                  101        h      70-99            mg/dL  BUN                                      15                6-23             mg/dL  Creatinine                               0.69              0.50-1.10        mg/dL  GFR, Est Non African American            >60               >60              mL/min  GFR, Est African American                >60               >60  mL/min    Oversized comment, see footnote  1  Bilirubin, Total                         0.9               0.3-1.2          mg/dL  Alkaline Phosphatase                     51                39-117           U/L  SGOT (AST)                               30                0-37             U/L  SGPT (ALT)                               24                 0-35             U/L  Total  Protein                           6.4               6.0-8.3          g/dL  Albumin-Blood                            3.3        l      3.5-5.2          g/dL  Calcium                                  8.1        l      8.4-10.5         Mg/dL  Anion gap - 20  ABG  Patient Temperature                      98.6 F  pH, Blood Gas                            7.519      h      7.350-7.400  pCO2                                     28.4       l      35.0-45.0        mmHg  pO2, Blood Gas                           65.0       l      80.0-100.0       mmHg  Bicarbonate  23.2              20.0-24.0        mEq/L  TCO2                                     24                0-100            mmol/L  Acid-Base Excess                         1.0               0.0-2.0          mmol/L  Oxygen Saturation                        95.0                               %  L-Osmolality, Serum  Osmolality, Serum                        250        l      275-300          MOsm/kg  Findings  Result Name                              Result     Abnl   Normal Range     Units        Osmolality, Serum                        250        l      275-300          mOsm/kg  Creatinine, Urinary                      71.79                              mg/dL  Sodium, Urine                            176                                MEq/L FENa - 1.4%  Ammonia                                  28                11-60            umol/L Magnesium                                1.8               1.5-2.5  Mg/dL Lactic Acid, Venous                      1.5               0.5-2.2          mmol/L   Color, Urine                             YELLOW            YELLOW  Appearance                               CLEAR             CLEAR  Specific Gravity                         1.019             1.005-1.030  pH                                       7.5               5.0-8.0  Urine  Glucose                            NEGATIVE          NEG              mg/dL  Bilirubin                                NEGATIVE          NEG  Ketones                                  15         a      NEG              mg/dL  Blood                                    SMALL      a      NEG  Protein                                  NEGATIVE          NEG              mg/dL  Urobilinogen                             1.0               0.0-1.0          mg/dL  Nitrite  NEGATIVE          NEG  Leukocytes                               NEGATIVE          NEG   Squamous Epithelial / LPF                RARE              RARE  RBC / HPF                                3-6               <3               RBC/hpf  Bacteria / HPF                           RARE              RARE  UDS  Amphetamins                              SEE NOTE.         NDT    NONE DETECTED  Barbiturates                             SEE NOTE.         NDT    Oversized comment, see footnote  1  Benzodiazepines                          SEE NOTE.         NDT    NONE DETECTED  Cocaine                                  SEE NOTE.         NDT    NONE DETECTED  Opiates                                  SEE NOTE.         NDT    NONE DETECTED  Tetrahydrocannabinol                     SEE NOTE.         NDT    NONE DETECTED    Following labs are reported from [facility] taken 12/29/2010.  WBC 9.1, hemoglobin 13.9, hematocrit 40.7, platelets 154  Sodium 138, potassium 4.0, chloride 15, bicarbonate 25, BUN 18, creatinine 1.17. Glucose 150.anion gap 8  AST 27 ALT 25, alkaline phosphatase 61, and 4.1, total protein 8.1, total bilirubin 0.9, osmolality 280, lipase 97, troponin I less than 0.02. TSH 3.01  CSF: RBCs 71, WBC 41, and neutrophils 48, lymphocytes 38, monocytes 14, eosinophils 0 glucose 65, protein 37. Gram stain no organisms seen and no white blood cells.  Urinalysis significant for 2+  blood negative for protein nitrate and esterase. Microscopic analysis there are 3 red blood cells  per high-power field, 2 white blood cells per high-power field, 1+ bacteria, 2 squamous epithelial cells per high-power field, and mucous present.  Imaging results:   DG Pneumonia Chest 1V - 31Jul12 14:06    Findings: Shallow inspiration.  Linear infiltration in both lung   bases, new since the prior study.  This could be due linear   atelectasis or bibasilar pneumonia.  No definite blunting of   costophrenic angles, however, the left costophrenic angle is   somewhat obscured and a small effusion is possible.  The heart size   and pulmonary vascularity are borderline, likely normal for shallow   inspiration.  No pneumothorax.  Surgical clips right upper   quadrant.    IMPRESSION:   Linear atelectasis or infiltration in both lung bases.   Assessment & Plan written by Bethel Born:   1. AMS- as per her son, this is not her baseline. differentials include UTI, hyponatremia, sepsis  CT scan and CSF analysis at [facility] was negative, so will not repeat it.  Will monitor her mental function while treating medical problems, have sitter at bedside and fall precautions   2. UTI- will treat with augmenting for combined effect on dental abscess as well. Await culture results   3. Dental abscess- no abscess noted on physical exam, but with high fever and report that they have been to her dentist today who told she has an abscess, will order orthopentogram. Will treat with augmenting.   4. Hyponatremia- She has been vomiting and probably poor oral intake from altered metal status causing dehydration. Her BUN/Cr ration supports this. Will check urine sodium, creatinine, urine and serum osmolarity. Will hydrate with NS aat 100cc/hr and recheck BMET in 3 hrs to assure that rate of correction does not exceed 11meq/24 hrs.   5. Anion gap- secondary to hypochloremia from vomiting. Will check ABG.  Continue to monitor.   6. Heart murmur-she has a systolic murmur which she says has been there for a while. But, given hifh fevers, endocarditis is a concern. Will check 2D echo  7. Headache- pain controlled. secondary to dental abscess.   8. DVt Px- lovenox          Quentin Ore, M.D. (PGY1): ____________________________________   Date/ Time: ____________________________________     Scot Dock, M.D. (PGY3): ____________________________________   Date/ Time: ____________________________________     I have seen and examined the patient. I reviewed the resident/fellow note and agree with the findings and plan of care as documented. My additions and revisions are included.  Dr Coralee Pesa.  Signature: ____________________________________________  Internal Medicine Teaching Service Attending    Date: ____________________________________________

## 2011-01-01 ENCOUNTER — Inpatient Hospital Stay (HOSPITAL_COMMUNITY): Payer: Medicare Other

## 2011-01-01 DIAGNOSIS — I059 Rheumatic mitral valve disease, unspecified: Secondary | ICD-10-CM

## 2011-01-01 DIAGNOSIS — R509 Fever, unspecified: Secondary | ICD-10-CM

## 2011-01-01 DIAGNOSIS — R404 Transient alteration of awareness: Secondary | ICD-10-CM

## 2011-01-01 LAB — BASIC METABOLIC PANEL
BUN: 14 mg/dL (ref 6–23)
BUN: 13 mg/dL (ref 6–23)
BUN: 12 mg/dL (ref 6–23)
CO2: 24 mEq/L (ref 19–32)
CO2: 21 mEq/L (ref 19–32)
CO2: 18 mEq/L — ABNORMAL LOW (ref 19–32)
Calcium: 8.1 mg/dL — ABNORMAL LOW (ref 8.4–10.5)
Calcium: 7.5 mg/dL — ABNORMAL LOW (ref 8.4–10.5)
Chloride: 91 mEq/L — ABNORMAL LOW (ref 96–112)
Chloride: 91 mEq/L — ABNORMAL LOW (ref 96–112)
Creatinine, Ser: 0.73 mg/dL (ref 0.50–1.10)
Creatinine, Ser: 0.8 mg/dL (ref 0.50–1.10)
GFR calc non Af Amer: >60 mL/min (ref >60)
Glucose, Bld: 155 mg/dL — ABNORMAL HIGH (ref 70–99)
Glucose, Bld: 115 mg/dL — ABNORMAL HIGH (ref 70–99)
Glucose, Bld: 110 mg/dL — ABNORMAL HIGH (ref 70–99)
Potassium: 3.3 mEq/L — ABNORMAL LOW (ref 3.5–5.1)
Sodium: 123 mEq/L — ABNORMAL LOW (ref 135–145)
Sodium: 124 mEq/L — ABNORMAL LOW (ref 135–145)

## 2011-01-01 LAB — CBC
Hemoglobin: 13.2 g/dL (ref 12.0–15.0)
MCH: 28 pg (ref 26.0–34.0)
MCHC: 36 g/dL (ref 30.0–36.0)
MCV: 77.8 fL — ABNORMAL LOW (ref 78.0–100.0)
Platelets: 131 10*3/uL — ABNORMAL LOW (ref 150–400)
RBC: 4.72 MIL/uL (ref 3.87–5.11)

## 2011-01-01 LAB — URINE CULTURE
Colony Count: 8000
Culture  Setup Time: 201207311605

## 2011-01-02 ENCOUNTER — Inpatient Hospital Stay (HOSPITAL_COMMUNITY): Payer: Medicare Other

## 2011-01-02 DIAGNOSIS — A419 Sepsis, unspecified organism: Secondary | ICD-10-CM

## 2011-01-02 DIAGNOSIS — R509 Fever, unspecified: Secondary | ICD-10-CM

## 2011-01-02 DIAGNOSIS — J9600 Acute respiratory failure, unspecified whether with hypoxia or hypercapnia: Secondary | ICD-10-CM

## 2011-01-02 DIAGNOSIS — R404 Transient alteration of awareness: Secondary | ICD-10-CM

## 2011-01-02 DIAGNOSIS — G0481 Other encephalitis and encephalomyelitis: Secondary | ICD-10-CM

## 2011-01-02 DIAGNOSIS — A879 Viral meningitis, unspecified: Secondary | ICD-10-CM

## 2011-01-02 LAB — HEPATIC FUNCTION PANEL
AST: 29 U/L (ref 0–37)
Albumin: 3.2 g/dL — ABNORMAL LOW (ref 3.5–5.2)
Bilirubin, Direct: 0.2 mg/dL (ref 0.0–0.3)
Total Bilirubin: 0.9 mg/dL (ref 0.3–1.2)

## 2011-01-02 LAB — GLUCOSE, CAPILLARY
Glucose-Capillary: 185 mg/dL — ABNORMAL HIGH (ref 70–99)
Glucose-Capillary: 139 mg/dL — ABNORMAL HIGH (ref 70–99)
Glucose-Capillary: 117 mg/dL — ABNORMAL HIGH (ref 70–99)
Glucose-Capillary: 109 mg/dL — ABNORMAL HIGH (ref 70–99)

## 2011-01-02 LAB — BASIC METABOLIC PANEL
BUN: 17 mg/dL (ref 6–23)
BUN: 16 mg/dL (ref 6–23)
CO2: 21 mEq/L (ref 19–32)
CO2: 23 mEq/L (ref 19–32)
Chloride: 89 mEq/L — ABNORMAL LOW (ref 96–112)
Creatinine, Ser: 0.94 mg/dL (ref 0.50–1.10)
GFR calc Af Amer: >60 mL/min (ref >60)
GFR calc Af Amer: >60 mL/min (ref >60)
Glucose, Bld: 162 mg/dL — ABNORMAL HIGH (ref 70–99)
Potassium: 3.6 mEq/L (ref 3.5–5.1)

## 2011-01-02 LAB — CARDIAC PANEL(CRET KIN+CKTOT+MB+TROPI)
Relative Index: INVALID (ref 0.0–2.5)
Troponin I: <0.3 ng/mL (ref <0.30)

## 2011-01-02 LAB — POCT I-STAT 3, ART BLOOD GAS (G3+)
Bicarbonate: 21.9 mEq/L (ref 20.0–24.0)
Patient temperature: 98.5

## 2011-01-02 LAB — URINALYSIS, ROUTINE W REFLEX MICROSCOPIC
Glucose, UA: NEGATIVE mg/dL
Ketones, ur: NEGATIVE mg/dL
Leukocytes, UA: NEGATIVE
Nitrite: NEGATIVE
Protein, ur: NEGATIVE mg/dL
Urobilinogen, UA: 0.2 mg/dL (ref 0.0–1.0)

## 2011-01-02 LAB — LACTIC ACID, PLASMA: Lactic Acid, Venous: 2.9 mmol/L — ABNORMAL HIGH (ref 0.5–2.2)

## 2011-01-02 LAB — BLOOD GAS, ARTERIAL
Bicarbonate: 22.6 mEq/L (ref 20.0–24.0)
Patient temperature: 98.6
pCO2 arterial: 37.6 mmHg (ref 35.0–45.0)
pH, Arterial: 7.396 (ref 7.350–7.400)

## 2011-01-02 LAB — PROCALCITONIN: Procalcitonin: 0.34 ng/mL

## 2011-01-02 LAB — PHOSPHORUS: Phosphorus: 3.2 mg/dL (ref 2.3–4.6)

## 2011-01-02 LAB — URINE MICROSCOPIC-ADD ON

## 2011-01-02 LAB — MAGNESIUM: Magnesium: 2.5 mg/dL (ref 1.5–2.5)

## 2011-01-02 LAB — ROCKY MTN SPOTTED FVR AB, IGM-BLOOD: RMSF IgM: 0.49 IV (ref 0.00–0.89)

## 2011-01-02 MED ORDER — GADOBENATE DIMEGLUMINE 529 MG/ML IV SOLN
15.0000 mL | Freq: Once | INTRAVENOUS | Status: AC | PRN
  Administered 2011-01-02: 15 mL via INTRAVENOUS

## 2011-01-03 ENCOUNTER — Inpatient Hospital Stay (HOSPITAL_COMMUNITY): Payer: Medicare Other

## 2011-01-03 DIAGNOSIS — B004 Herpesviral encephalitis: Secondary | ICD-10-CM

## 2011-01-03 LAB — BLOOD GAS, ARTERIAL
Acid-Base Excess: 2.6 mmol/L — ABNORMAL HIGH (ref 0.0–2.0)
Bicarbonate: 25.6 mEq/L — ABNORMAL HIGH (ref 20.0–24.0)
FIO2: 40 %
O2 Saturation: 97.6 %
TCO2: 26.6 mmol/L (ref 0–100)
pO2, Arterial: 88.9 mmHg (ref 80.0–100.0)

## 2011-01-03 LAB — CBC
HCT: 37 % (ref 36.0–46.0)
Hemoglobin: 13 g/dL (ref 12.0–15.0)
MCH: 27.4 pg (ref 26.0–34.0)
MCHC: 35.1 g/dL (ref 30.0–36.0)
RBC: 4.75 MIL/uL (ref 3.87–5.11)

## 2011-01-03 LAB — COMPREHENSIVE METABOLIC PANEL
Alkaline Phosphatase: 39 U/L (ref 39–117)
BUN: 18 mg/dL (ref 6–23)
CO2: 25 mEq/L (ref 19–32)
Calcium: 7.5 mg/dL — ABNORMAL LOW (ref 8.4–10.5)
GFR calc Af Amer: >60 mL/min (ref >60)
GFR calc non Af Amer: 57 mL/min — ABNORMAL LOW (ref >60)
Glucose, Bld: 129 mg/dL — ABNORMAL HIGH (ref 70–99)
Potassium: 2.7 mEq/L — CL (ref 3.5–5.1)
Total Protein: 5.5 g/dL — ABNORMAL LOW (ref 6.0–8.3)

## 2011-01-03 LAB — DIFFERENTIAL
Lymphocytes Relative: 10 % — ABNORMAL LOW (ref 12–46)
Monocytes Absolute: 0.8 10*3/uL (ref 0.1–1.0)
Monocytes Relative: 5 % (ref 3–12)
Neutro Abs: 13.1 10*3/uL — ABNORMAL HIGH (ref 1.7–7.7)

## 2011-01-03 LAB — HIV ANTIBODY (ROUTINE TESTING W REFLEX): HIV: NONREACTIVE

## 2011-01-03 LAB — GLUCOSE, CAPILLARY
Glucose-Capillary: 114 mg/dL — ABNORMAL HIGH (ref 70–99)
Glucose-Capillary: 126 mg/dL — ABNORMAL HIGH (ref 70–99)

## 2011-01-03 LAB — BASIC METABOLIC PANEL
Calcium: 8 mg/dL — ABNORMAL LOW (ref 8.4–10.5)
GFR calc non Af Amer: >60 mL/min (ref >60)
Sodium: 125 mEq/L — ABNORMAL LOW (ref 135–145)

## 2011-01-03 LAB — MAGNESIUM: Magnesium: 2.2 mg/dL (ref 1.5–2.5)

## 2011-01-04 ENCOUNTER — Inpatient Hospital Stay (HOSPITAL_COMMUNITY): Payer: Medicare Other

## 2011-01-04 LAB — POCT I-STAT 3, ART BLOOD GAS (G3+)
Acid-Base Excess: 5 mmol/L — ABNORMAL HIGH (ref 0.0–2.0)
Bicarbonate: 26.6 mEq/L — ABNORMAL HIGH (ref 20.0–24.0)
Patient temperature: 102.4
TCO2: 27 mmol/L (ref 0–100)
TCO2: 28 mmol/L (ref 0–100)
pH, Arterial: 7.515 — ABNORMAL HIGH (ref 7.350–7.400)

## 2011-01-04 LAB — BASIC METABOLIC PANEL
BUN: 20 mg/dL (ref 6–23)
GFR calc Af Amer: >60 mL/min (ref >60)
GFR calc non Af Amer: 58 mL/min — ABNORMAL LOW (ref >60)
Potassium: 3.7 mEq/L (ref 3.5–5.1)
Sodium: 125 mEq/L — ABNORMAL LOW (ref 135–145)

## 2011-01-04 LAB — CBC
Hemoglobin: 12.2 g/dL (ref 12.0–15.0)
MCHC: 35.6 g/dL (ref 30.0–36.0)
RBC: 4.34 MIL/uL (ref 3.87–5.11)
WBC: 11.9 10*3/uL — ABNORMAL HIGH (ref 4.0–10.5)

## 2011-01-04 LAB — GLUCOSE, CAPILLARY
Glucose-Capillary: 114 mg/dL — ABNORMAL HIGH (ref 70–99)
Glucose-Capillary: 159 mg/dL — ABNORMAL HIGH (ref 70–99)

## 2011-01-04 LAB — PROTIME-INR
INR: 1.18 (ref 0.00–1.49)
Prothrombin Time: 15.3 seconds — ABNORMAL HIGH (ref 11.6–15.2)

## 2011-01-04 LAB — APTT: aPTT: 33 seconds (ref 24–37)

## 2011-01-05 ENCOUNTER — Inpatient Hospital Stay (HOSPITAL_COMMUNITY): Payer: Medicare Other

## 2011-01-05 DIAGNOSIS — J9600 Acute respiratory failure, unspecified whether with hypoxia or hypercapnia: Secondary | ICD-10-CM

## 2011-01-05 DIAGNOSIS — G0481 Other encephalitis and encephalomyelitis: Secondary | ICD-10-CM

## 2011-01-05 LAB — CULTURE, RESPIRATORY W GRAM STAIN

## 2011-01-05 LAB — BASIC METABOLIC PANEL
BUN: 16 mg/dL (ref 6–23)
Calcium: 7.9 mg/dL — ABNORMAL LOW (ref 8.4–10.5)
Creatinine, Ser: 0.63 mg/dL (ref 0.50–1.10)
GFR calc non Af Amer: >60 mL/min (ref >60)
Glucose, Bld: 151 mg/dL — ABNORMAL HIGH (ref 70–99)
Sodium: 128 mEq/L — ABNORMAL LOW (ref 135–145)

## 2011-01-06 ENCOUNTER — Inpatient Hospital Stay (HOSPITAL_COMMUNITY): Payer: Medicare Other

## 2011-01-06 DIAGNOSIS — A419 Sepsis, unspecified organism: Secondary | ICD-10-CM

## 2011-01-06 DIAGNOSIS — A879 Viral meningitis, unspecified: Secondary | ICD-10-CM

## 2011-01-06 DIAGNOSIS — R404 Transient alteration of awareness: Secondary | ICD-10-CM

## 2011-01-06 DIAGNOSIS — R509 Fever, unspecified: Secondary | ICD-10-CM

## 2011-01-06 LAB — DIFFERENTIAL
Lymphocytes Relative: 17 % (ref 12–46)
Lymphs Abs: 1.6 10*3/uL (ref 0.7–4.0)
Monocytes Absolute: 0.6 10*3/uL (ref 0.1–1.0)
Monocytes Relative: 6 % (ref 3–12)
Neutro Abs: 6.8 10*3/uL (ref 1.7–7.7)

## 2011-01-06 LAB — GLUCOSE, CAPILLARY
Glucose-Capillary: 151 mg/dL — ABNORMAL HIGH (ref 70–99)
Glucose-Capillary: 122 mg/dL — ABNORMAL HIGH (ref 70–99)
Glucose-Capillary: 145 mg/dL — ABNORMAL HIGH (ref 70–99)
Glucose-Capillary: 117 mg/dL — ABNORMAL HIGH (ref 70–99)
Glucose-Capillary: 125 mg/dL — ABNORMAL HIGH (ref 70–99)
Glucose-Capillary: 136 mg/dL — ABNORMAL HIGH (ref 70–99)

## 2011-01-06 LAB — CBC
HCT: 35.3 % — ABNORMAL LOW (ref 36.0–46.0)
Hemoglobin: 12.3 g/dL (ref 12.0–15.0)
MCH: 27.5 pg (ref 26.0–34.0)
MCHC: 34.8 g/dL (ref 30.0–36.0)

## 2011-01-06 LAB — HEPATIC FUNCTION PANEL
Alkaline Phosphatase: 49 U/L (ref 39–117)
Bilirubin, Direct: 0.2 mg/dL (ref 0.0–0.3)
Indirect Bilirubin: 0.4 mg/dL (ref 0.3–0.9)
Total Bilirubin: 0.6 mg/dL (ref 0.3–1.2)
Total Protein: 6.4 g/dL (ref 6.0–8.3)

## 2011-01-06 LAB — BASIC METABOLIC PANEL
BUN: 15 mg/dL (ref 6–23)
CO2: 24 mEq/L (ref 19–32)
Calcium: 8.3 mg/dL — ABNORMAL LOW (ref 8.4–10.5)
GFR calc non Af Amer: >60 mL/min (ref >60)
Glucose, Bld: 109 mg/dL — ABNORMAL HIGH (ref 70–99)

## 2011-01-06 LAB — POCT I-STAT 3, ART BLOOD GAS (G3+)
Acid-Base Excess: 2 mmol/L (ref 0.0–2.0)
O2 Saturation: 100 %
Patient temperature: 99.5
TCO2: 23 mmol/L (ref 0–100)
pH, Arterial: 7.558 — ABNORMAL HIGH (ref 7.350–7.400)

## 2011-01-06 LAB — HSV(HERPES SIMPLEX VRS) I + II AB-IGG: HSV 2 Glycoprotein G Ab, IgG: 0.2 IV

## 2011-01-07 DIAGNOSIS — J9600 Acute respiratory failure, unspecified whether with hypoxia or hypercapnia: Secondary | ICD-10-CM

## 2011-01-07 DIAGNOSIS — A879 Viral meningitis, unspecified: Secondary | ICD-10-CM

## 2011-01-07 DIAGNOSIS — G0481 Other encephalitis and encephalomyelitis: Secondary | ICD-10-CM

## 2011-01-07 DIAGNOSIS — A419 Sepsis, unspecified organism: Secondary | ICD-10-CM

## 2011-01-07 LAB — CBC
MCHC: 35 g/dL (ref 30.0–36.0)
Platelets: 190 10*3/uL (ref 150–400)
RDW: 13.3 % (ref 11.5–15.5)
WBC: 18.2 10*3/uL — ABNORMAL HIGH (ref 4.0–10.5)

## 2011-01-07 LAB — GLUCOSE, CAPILLARY
Glucose-Capillary: 99 mg/dL (ref 70–99)
Glucose-Capillary: 141 mg/dL — ABNORMAL HIGH (ref 70–99)

## 2011-01-07 LAB — BASIC METABOLIC PANEL
BUN: 18 mg/dL (ref 6–23)
GFR calc Af Amer: >60 mL/min (ref >60)
GFR calc non Af Amer: >60 mL/min (ref >60)
Potassium: 3.5 mEq/L (ref 3.5–5.1)
Sodium: 126 mEq/L — ABNORMAL LOW (ref 135–145)

## 2011-01-07 LAB — CULTURE, BLOOD (ROUTINE X 2)
Culture  Setup Time: 201208010105
Culture: NO GROWTH

## 2011-01-07 LAB — PHOSPHORUS: Phosphorus: 3.3 mg/dL (ref 2.3–4.6)

## 2011-01-07 LAB — MAGNESIUM: Magnesium: 2.2 mg/dL (ref 1.5–2.5)

## 2011-01-07 LAB — BODY FLUID CELL COUNT WITH DIFFERENTIAL: Monocyte-Macrophage-Serous Fluid: 4 % — ABNORMAL LOW (ref 50–90)

## 2011-01-08 ENCOUNTER — Inpatient Hospital Stay (HOSPITAL_COMMUNITY): Payer: Medicare Other

## 2011-01-08 DIAGNOSIS — A879 Viral meningitis, unspecified: Secondary | ICD-10-CM

## 2011-01-08 DIAGNOSIS — A419 Sepsis, unspecified organism: Secondary | ICD-10-CM

## 2011-01-08 DIAGNOSIS — J9600 Acute respiratory failure, unspecified whether with hypoxia or hypercapnia: Secondary | ICD-10-CM

## 2011-01-08 DIAGNOSIS — G0481 Other encephalitis and encephalomyelitis: Secondary | ICD-10-CM

## 2011-01-08 DIAGNOSIS — B004 Herpesviral encephalitis: Secondary | ICD-10-CM

## 2011-01-08 LAB — CBC
HCT: 29.9 % — ABNORMAL LOW (ref 36.0–46.0)
MCHC: 34.1 g/dL (ref 30.0–36.0)
MCV: 80.8 fL (ref 78.0–100.0)
Platelets: 192 10*3/uL (ref 150–400)
RDW: 13.8 % (ref 11.5–15.5)

## 2011-01-08 LAB — BLOOD GAS, ARTERIAL
Drawn by: 35135
MECHVT: 450 mL
Patient temperature: 99.1
RATE: 14 resp/min
TCO2: 24.9 mmol/L (ref 0–100)
pH, Arterial: 7.51 — ABNORMAL HIGH (ref 7.350–7.400)

## 2011-01-08 LAB — BASIC METABOLIC PANEL
BUN: 18 mg/dL (ref 6–23)
Creatinine, Ser: 0.89 mg/dL (ref 0.50–1.10)
GFR calc Af Amer: >60 mL/min (ref >60)
GFR calc non Af Amer: >60 mL/min (ref >60)

## 2011-01-08 LAB — CULTURE, BLOOD (ROUTINE X 2)
Culture  Setup Time: 201208021438
Culture: NO GROWTH

## 2011-01-08 LAB — PATHOLOGIST SMEAR REVIEW

## 2011-01-08 LAB — GLUCOSE, CAPILLARY
Glucose-Capillary: 170 mg/dL — ABNORMAL HIGH (ref 70–99)
Glucose-Capillary: 128 mg/dL — ABNORMAL HIGH (ref 70–99)
Glucose-Capillary: 157 mg/dL — ABNORMAL HIGH (ref 70–99)

## 2011-01-08 LAB — ROCKY MTN SPOTTED FVR AB, IGM-BLOOD: RMSF IgM: 0.42 IV (ref 0.00–0.89)

## 2011-01-09 ENCOUNTER — Inpatient Hospital Stay (HOSPITAL_COMMUNITY): Payer: Medicare Other

## 2011-01-09 DIAGNOSIS — A879 Viral meningitis, unspecified: Secondary | ICD-10-CM

## 2011-01-09 DIAGNOSIS — A419 Sepsis, unspecified organism: Secondary | ICD-10-CM

## 2011-01-09 LAB — POCT I-STAT 3, ART BLOOD GAS (G3+)
Acid-Base Excess: 5 mmol/L — ABNORMAL HIGH (ref 0.0–2.0)
Bicarbonate: 26.3 mEq/L — ABNORMAL HIGH (ref 20.0–24.0)
TCO2: 27 mmol/L (ref 0–100)
pCO2 arterial: 33.2 mmHg — ABNORMAL LOW (ref 35.0–45.0)
pH, Arterial: 7.523 — ABNORMAL HIGH (ref 7.350–7.400)
pH, Arterial: 7.507 — ABNORMAL HIGH (ref 7.350–7.400)
pO2, Arterial: 87 mmHg (ref 80.0–100.0)
pO2, Arterial: 81 mmHg (ref 80.0–100.0)

## 2011-01-09 LAB — CBC
MCV: 80.8 fL (ref 78.0–100.0)
Platelets: 212 10*3/uL (ref 150–400)
RDW: 13.7 % (ref 11.5–15.5)
WBC: 12 10*3/uL — ABNORMAL HIGH (ref 4.0–10.5)

## 2011-01-09 LAB — CULTURE, BAL-QUANTITATIVE W GRAM STAIN: Culture: NO GROWTH

## 2011-01-09 LAB — GLUCOSE, CAPILLARY
Glucose-Capillary: 142 mg/dL — ABNORMAL HIGH (ref 70–99)
Glucose-Capillary: 157 mg/dL — ABNORMAL HIGH (ref 70–99)

## 2011-01-09 LAB — MAGNESIUM: Magnesium: 2.5 mg/dL (ref 1.5–2.5)

## 2011-01-09 LAB — CULTURE, RESPIRATORY W GRAM STAIN

## 2011-01-09 LAB — BASIC METABOLIC PANEL
Calcium: 9.2 mg/dL (ref 8.4–10.5)
Chloride: 89 mEq/L — ABNORMAL LOW (ref 96–112)
Creatinine, Ser: 0.85 mg/dL (ref 0.50–1.10)
GFR calc Af Amer: >60 mL/min (ref >60)

## 2011-01-10 ENCOUNTER — Inpatient Hospital Stay (HOSPITAL_COMMUNITY): Payer: Medicare Other

## 2011-01-10 LAB — BLOOD GAS, ARTERIAL
Drawn by: 31843
O2 Saturation: 93.5 %
PEEP: 5 cmH2O
RATE: 14 resp/min
pCO2 arterial: 38 mmHg (ref 35.0–45.0)
pH, Arterial: 7.563 — ABNORMAL HIGH (ref 7.350–7.400)
pO2, Arterial: 62 mmHg — ABNORMAL LOW (ref 80.0–100.0)

## 2011-01-10 LAB — CBC
MCH: 28 pg (ref 26.0–34.0)
Platelets: 318 10*3/uL (ref 150–400)
RBC: 4.21 MIL/uL (ref 3.87–5.11)
WBC: 10.9 10*3/uL — ABNORMAL HIGH (ref 4.0–10.5)

## 2011-01-10 LAB — GLUCOSE, CAPILLARY
Glucose-Capillary: 139 mg/dL — ABNORMAL HIGH (ref 70–99)
Glucose-Capillary: 151 mg/dL — ABNORMAL HIGH (ref 70–99)

## 2011-01-10 LAB — MAGNESIUM: Magnesium: 2.7 mg/dL — ABNORMAL HIGH (ref 1.5–2.5)

## 2011-01-10 LAB — PHOSPHORUS: Phosphorus: 3.1 mg/dL (ref 2.3–4.6)

## 2011-01-11 ENCOUNTER — Inpatient Hospital Stay (HOSPITAL_COMMUNITY): Payer: Medicare Other

## 2011-01-11 DIAGNOSIS — J9600 Acute respiratory failure, unspecified whether with hypoxia or hypercapnia: Secondary | ICD-10-CM

## 2011-01-11 DIAGNOSIS — G0481 Other encephalitis and encephalomyelitis: Secondary | ICD-10-CM

## 2011-01-11 LAB — BASIC METABOLIC PANEL
CO2: 34 mEq/L — ABNORMAL HIGH (ref 19–32)
CO2: 32 mEq/L (ref 19–32)
Calcium: 10.1 mg/dL (ref 8.4–10.5)
Calcium: 10.8 mg/dL — ABNORMAL HIGH (ref 8.4–10.5)
Creatinine, Ser: 1.27 mg/dL — ABNORMAL HIGH (ref 0.50–1.10)
GFR calc Af Amer: 51 mL/min — ABNORMAL LOW (ref >60)
Potassium: 2.5 mEq/L — CL (ref 3.5–5.1)
Sodium: 129 mEq/L — ABNORMAL LOW (ref 135–145)

## 2011-01-11 LAB — GLUCOSE, CAPILLARY
Glucose-Capillary: 161 mg/dL — ABNORMAL HIGH (ref 70–99)
Glucose-Capillary: 153 mg/dL — ABNORMAL HIGH (ref 70–99)

## 2011-01-11 LAB — CBC
MCH: 28.3 pg (ref 26.0–34.0)
MCV: 81.1 fL (ref 78.0–100.0)
Platelets: 347 10*3/uL (ref 150–400)
RDW: 13.7 % (ref 11.5–15.5)

## 2011-01-11 LAB — BLOOD GAS, ARTERIAL
Drawn by: 244861
MECHVT: 400 mL
RATE: 14 resp/min
pCO2 arterial: 39.7 mmHg (ref 35.0–45.0)
pH, Arterial: 7.505 — ABNORMAL HIGH (ref 7.350–7.400)

## 2011-01-11 LAB — MAGNESIUM: Magnesium: 3.2 mg/dL — ABNORMAL HIGH (ref 1.5–2.5)

## 2011-01-11 NOTE — Consult Note (Signed)
[name], [name]               ACCOUNT NO.:  [id number]  MEDICAL RECORD NO.:  [id number]  LOCATION:  2104                         FACILITY:  [facility]  PHYSICIAN:  Gardiner Barefoot, MD    DATE OF BIRTH:  1946/03/09  DATE OF CONSULTATION: DATE OF DISCHARGE:                                CONSULTATION   REASON FOR CONSULTATION:  Encephalitis.  HISTORY OF PRESENT ILLNESS:  This is a 65 year old female with no significant past medical history who came to [facility] after 4 days of febrile illness.  She had initially gone to an outside hospital with findings of fever and in fact had a lumbar puncture, which showed 71 red blood cells, 41 white blood cells, but otherwise normal glucose and protein.  She was then sent out with no concern at that time for encephalitis.  She did not have any confusion just fever and was in fact treated for urinary tract infection.  She then presented again with some of the same complaints and because a concern of a tooth infection, was referred to an oral Careers adviser.  Oral surgeon saw the patient and did not think in fact that was causing the high fever and recommended further evaluation.  She then came [facility] with the complaints then a 4 days of fever, nausea and vomiting as well as altered mental status that was somewhat intermittent, but significant.  She had a report of a headache in the top of her head as well and diarrhea.  Otherwise, has been no recent travel or sick contacts.  No known history of HIV, HSV, or other known infection.  Upon evaluation in the emergency room, it was noted she was febrile and confused and admitted for further workup.  Her initial white blood cell count was 12.8 with 79% neutrophils.  Pain continues to be elevated at this time.  MEDICATIONS:  Home medications unknown.  Here, the patient is on 1. Acyclovir 700 mg every 8 hours. 2. Doxycycline 100 mg every 12 hours. 3. Zosyn 3.375 g every 8 hours.  ALLERGIES:   PREDNISONE, which causes stomach cramps.  SOCIAL HISTORY AND FAMILY HISTORY:  Unobtainable.  REVIEW OF SYSTEMS:  Unobtainable.  PHYSICAL EXAMINATION:  CURRENT VITAL SIGNS:  Temperature is 102.2, pulse 91, blood pressure is 147/72, and O2 sat 96%. GENERAL:  The patient is not arousable at this time.  Minimally to sternal rub. CARDIOVASCULAR:  Irregular and 3/6 systolic ejection murmur consistent with the MR. LUNGS:  Clear to auscultation bilaterally. ABDOMEN:  Soft, nontender, nondistended with positive bowel sounds. SKIN:  No rashes. EXTREMITIES:  No edema.  Current labs show negative HIV antibody.  Blood cultures are negative. Sodium is 126, potassium 2.7, chloride 92, bicarb 25, glucose 129, BUN 18, creatinine 0.97.  WBC is 15.4, 85% neutrophils.  ABG shows pH of 7.5, [place] spotted fever titers are negative to date.  AST 29, ALT 26.  MRI does show a T2 signal abnormality in the right temporal lobe, right insular cortex and inferomedial right frontal lobe.  Also a small abnormal signal in the left cortex temporal lobe with some leptomeningeal enhancement.  ASSESSMENT AND PLAN:  Encephalitis.  The  patient does have encephalitis based on fever, headache, confusion and lack with MRI findings.  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  She has had [facility] spotted fever titers, which are negative, though early infection negative is expected.  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  At this time, she is on acyclovir and should be continued.  Her course since her hospitalization has deteriorated some and she has been intubated and is in ICU now.  If she does not continue to improve, I would pursue definitive diagnosis with repeat CSF sample looking for HSV PCR as well as see if there is improvement in the cell counts.  At this time, I do not feel it is absolutely necessary,  she does have good evidence to suggest HSV is the cause.  I would though continue the other treatment as she is on pending improvement.  Other etiologies of cephalexin could be non-herpes virus; however, treatment will not be affected.     Gardiner Barefoot, MD     RWC/MEDQ  D:  01/03/2011  T:  01/03/2011  Job:  [id number]  Electronically Signed by Staci Righter MD on 01/11/2011 08:35:47 PM

## 2011-01-12 ENCOUNTER — Inpatient Hospital Stay (HOSPITAL_COMMUNITY): Payer: Medicare Other

## 2011-01-12 DIAGNOSIS — G0481 Other encephalitis and encephalomyelitis: Secondary | ICD-10-CM

## 2011-01-12 DIAGNOSIS — J9600 Acute respiratory failure, unspecified whether with hypoxia or hypercapnia: Secondary | ICD-10-CM

## 2011-01-12 LAB — CBC
HCT: 35 % — ABNORMAL LOW (ref 36.0–46.0)
MCH: 28 pg (ref 26.0–34.0)
MCV: 82.4 fL (ref 78.0–100.0)
Platelets: 366 10*3/uL (ref 150–400)
RBC: 4.25 MIL/uL (ref 3.87–5.11)

## 2011-01-12 LAB — GLUCOSE, CAPILLARY
Glucose-Capillary: 142 mg/dL — ABNORMAL HIGH (ref 70–99)
Glucose-Capillary: 146 mg/dL — ABNORMAL HIGH (ref 70–99)
Glucose-Capillary: 149 mg/dL — ABNORMAL HIGH (ref 70–99)
Glucose-Capillary: 155 mg/dL — ABNORMAL HIGH (ref 70–99)
Glucose-Capillary: 164 mg/dL — ABNORMAL HIGH (ref 70–99)

## 2011-01-12 LAB — BASIC METABOLIC PANEL
BUN: 49 mg/dL — ABNORMAL HIGH (ref 6–23)
CO2: 28 mEq/L (ref 19–32)
Calcium: 10.3 mg/dL (ref 8.4–10.5)
Creatinine, Ser: 0.89 mg/dL (ref 0.50–1.10)
Glucose, Bld: 143 mg/dL — ABNORMAL HIGH (ref 70–99)

## 2011-01-12 LAB — MAGNESIUM: Magnesium: 2.9 mg/dL — ABNORMAL HIGH (ref 1.5–2.5)

## 2011-01-13 ENCOUNTER — Inpatient Hospital Stay (HOSPITAL_COMMUNITY): Payer: Medicare Other

## 2011-01-13 DIAGNOSIS — J9600 Acute respiratory failure, unspecified whether with hypoxia or hypercapnia: Secondary | ICD-10-CM

## 2011-01-13 DIAGNOSIS — A879 Viral meningitis, unspecified: Secondary | ICD-10-CM

## 2011-01-13 DIAGNOSIS — A419 Sepsis, unspecified organism: Secondary | ICD-10-CM

## 2011-01-13 DIAGNOSIS — G0481 Other encephalitis and encephalomyelitis: Secondary | ICD-10-CM

## 2011-01-13 LAB — BASIC METABOLIC PANEL
Calcium: 9.9 mg/dL (ref 8.4–10.5)
GFR calc Af Amer: >60 mL/min (ref >60)
GFR calc non Af Amer: >60 mL/min (ref >60)
Potassium: 3.3 mEq/L — ABNORMAL LOW (ref 3.5–5.1)
Sodium: 137 mEq/L (ref 135–145)

## 2011-01-13 LAB — CBC
MCH: 27.4 pg (ref 26.0–34.0)
MCHC: 32.9 g/dL (ref 30.0–36.0)
Platelets: 346 10*3/uL (ref 150–400)
RDW: 14.2 % (ref 11.5–15.5)

## 2011-01-13 LAB — GLUCOSE, CAPILLARY
Glucose-Capillary: 158 mg/dL — ABNORMAL HIGH (ref 70–99)
Glucose-Capillary: 171 mg/dL — ABNORMAL HIGH (ref 70–99)

## 2011-01-13 LAB — PROTIME-INR: Prothrombin Time: 13.6 seconds (ref 11.6–15.2)

## 2011-01-14 ENCOUNTER — Inpatient Hospital Stay (HOSPITAL_COMMUNITY): Payer: Medicare Other

## 2011-01-14 DIAGNOSIS — A419 Sepsis, unspecified organism: Secondary | ICD-10-CM

## 2011-01-14 DIAGNOSIS — A879 Viral meningitis, unspecified: Secondary | ICD-10-CM

## 2011-01-14 LAB — CBC
HCT: 35.5 % — ABNORMAL LOW (ref 36.0–46.0)
MCHC: 32.7 g/dL (ref 30.0–36.0)
Platelets: 362 10*3/uL (ref 150–400)
RDW: 14.3 % (ref 11.5–15.5)
WBC: 10.1 10*3/uL (ref 4.0–10.5)

## 2011-01-14 LAB — GLUCOSE, CAPILLARY
Glucose-Capillary: 136 mg/dL — ABNORMAL HIGH (ref 70–99)
Glucose-Capillary: 137 mg/dL — ABNORMAL HIGH (ref 70–99)
Glucose-Capillary: 169 mg/dL — ABNORMAL HIGH (ref 70–99)

## 2011-01-14 LAB — URINE CULTURE
Colony Count: NO GROWTH
Culture: NO GROWTH

## 2011-01-14 LAB — BASIC METABOLIC PANEL
BUN: 45 mg/dL — ABNORMAL HIGH (ref 6–23)
Chloride: 100 mEq/L (ref 96–112)
GFR calc Af Amer: >60 mL/min (ref >60)
GFR calc non Af Amer: >60 mL/min (ref >60)
Potassium: 3.9 mEq/L (ref 3.5–5.1)
Sodium: 139 mEq/L (ref 135–145)

## 2011-01-14 LAB — MAGNESIUM: Magnesium: 2.6 mg/dL — ABNORMAL HIGH (ref 1.5–2.5)

## 2011-01-15 ENCOUNTER — Inpatient Hospital Stay (HOSPITAL_COMMUNITY): Payer: Medicare Other

## 2011-01-15 LAB — BASIC METABOLIC PANEL
Calcium: 9.4 mg/dL (ref 8.4–10.5)
Creatinine, Ser: 0.63 mg/dL (ref 0.50–1.10)
GFR calc Af Amer: >60 mL/min (ref >60)
GFR calc non Af Amer: >60 mL/min (ref >60)
Sodium: 141 mEq/L (ref 135–145)

## 2011-01-15 LAB — CBC
MCH: 27.8 pg (ref 26.0–34.0)
MCV: 84.5 fL (ref 78.0–100.0)
Platelets: 345 10*3/uL (ref 150–400)
RDW: 14.4 % (ref 11.5–15.5)

## 2011-01-15 LAB — GLUCOSE, CAPILLARY
Glucose-Capillary: 146 mg/dL — ABNORMAL HIGH (ref 70–99)
Glucose-Capillary: 159 mg/dL — ABNORMAL HIGH (ref 70–99)
Glucose-Capillary: 130 mg/dL — ABNORMAL HIGH (ref 70–99)

## 2011-01-16 ENCOUNTER — Inpatient Hospital Stay (HOSPITAL_COMMUNITY): Payer: Medicare Other

## 2011-01-16 LAB — CBC
HCT: 33.6 % — ABNORMAL LOW (ref 36.0–46.0)
HCT: 35.1 % — ABNORMAL LOW (ref 36.0–46.0)
Hemoglobin: 11.2 g/dL — ABNORMAL LOW (ref 12.0–15.0)
MCHC: 33.3 g/dL (ref 30.0–36.0)
MCHC: 33.3 g/dL (ref 30.0–36.0)
MCV: 85 fL (ref 78.0–100.0)
Platelets: 391 10*3/uL (ref 150–400)
RBC: 3.95 MIL/uL (ref 3.87–5.11)
RDW: 14.6 % (ref 11.5–15.5)
WBC: 10.1 10*3/uL (ref 4.0–10.5)

## 2011-01-16 LAB — APTT: aPTT: 26 seconds (ref 24–37)

## 2011-01-16 LAB — COMPREHENSIVE METABOLIC PANEL
ALT: 95 U/L — ABNORMAL HIGH (ref 0–35)
AST: 43 U/L — ABNORMAL HIGH (ref 0–37)
Albumin: 3.6 g/dL (ref 3.5–5.2)
CO2: 31 mEq/L (ref 19–32)
Chloride: 103 mEq/L (ref 96–112)
Creatinine, Ser: 0.7 mg/dL (ref 0.50–1.10)
GFR calc non Af Amer: >60 mL/min (ref >60)
Sodium: 144 mEq/L (ref 135–145)
Total Bilirubin: 0.5 mg/dL (ref 0.3–1.2)

## 2011-01-16 LAB — PROTIME-INR: Prothrombin Time: 13.5 seconds (ref 11.6–15.2)

## 2011-01-16 LAB — GLUCOSE, CAPILLARY
Glucose-Capillary: 139 mg/dL — ABNORMAL HIGH (ref 70–99)
Glucose-Capillary: 147 mg/dL — ABNORMAL HIGH (ref 70–99)
Glucose-Capillary: 136 mg/dL — ABNORMAL HIGH (ref 70–99)
Glucose-Capillary: 134 mg/dL — ABNORMAL HIGH (ref 70–99)

## 2011-01-17 ENCOUNTER — Inpatient Hospital Stay (HOSPITAL_COMMUNITY): Payer: Medicare Other

## 2011-01-17 DIAGNOSIS — J9600 Acute respiratory failure, unspecified whether with hypoxia or hypercapnia: Secondary | ICD-10-CM

## 2011-01-17 DIAGNOSIS — G0481 Other encephalitis and encephalomyelitis: Secondary | ICD-10-CM

## 2011-01-17 DIAGNOSIS — R509 Fever, unspecified: Secondary | ICD-10-CM

## 2011-01-17 DIAGNOSIS — Z930 Tracheostomy status: Secondary | ICD-10-CM

## 2011-01-17 LAB — CBC
MCH: 27.8 pg (ref 26.0–34.0)
MCHC: 32.5 g/dL (ref 30.0–36.0)
MCV: 85.5 fL (ref 78.0–100.0)
Platelets: 326 10*3/uL (ref 150–400)
RBC: 3.92 MIL/uL (ref 3.87–5.11)
RDW: 14.9 % (ref 11.5–15.5)

## 2011-01-17 LAB — URINALYSIS, MICROSCOPIC ONLY
Bilirubin Urine: NEGATIVE
Glucose, UA: NEGATIVE mg/dL
Hgb urine dipstick: NEGATIVE
Ketones, ur: NEGATIVE mg/dL
Specific Gravity, Urine: 1.023 (ref 1.005–1.030)
pH: 5 (ref 5.0–8.0)

## 2011-01-17 LAB — BASIC METABOLIC PANEL
CO2: 32 mEq/L (ref 19–32)
Calcium: 9.6 mg/dL (ref 8.4–10.5)
Creatinine, Ser: 0.69 mg/dL (ref 0.50–1.10)
GFR calc non Af Amer: >60 mL/min (ref >60)

## 2011-01-17 LAB — GLUCOSE, CAPILLARY
Glucose-Capillary: 163 mg/dL — ABNORMAL HIGH (ref 70–99)
Glucose-Capillary: 131 mg/dL — ABNORMAL HIGH (ref 70–99)

## 2011-01-17 LAB — URINE CULTURE: Culture: NO GROWTH

## 2011-01-18 ENCOUNTER — Inpatient Hospital Stay (HOSPITAL_COMMUNITY): Payer: Medicare Other

## 2011-01-18 LAB — CBC
MCHC: 31.1 g/dL (ref 30.0–36.0)
MCV: 88.6 fL (ref 78.0–100.0)
Platelets: 278 10*3/uL (ref 150–400)
RDW: 15.4 % (ref 11.5–15.5)
WBC: 10 10*3/uL (ref 4.0–10.5)

## 2011-01-18 LAB — GLUCOSE, CAPILLARY
Glucose-Capillary: 123 mg/dL — ABNORMAL HIGH (ref 70–99)
Glucose-Capillary: 148 mg/dL — ABNORMAL HIGH (ref 70–99)
Glucose-Capillary: 164 mg/dL — ABNORMAL HIGH (ref 70–99)

## 2011-01-18 LAB — COMPREHENSIVE METABOLIC PANEL
Albumin: 3.1 g/dL — ABNORMAL LOW (ref 3.5–5.2)
Alkaline Phosphatase: 65 U/L (ref 39–117)
BUN: 38 mg/dL — ABNORMAL HIGH (ref 6–23)
Chloride: 104 mEq/L (ref 96–112)
Glucose, Bld: 209 mg/dL — ABNORMAL HIGH (ref 70–99)
Potassium: 3.9 mEq/L (ref 3.5–5.1)
Total Bilirubin: 0.4 mg/dL (ref 0.3–1.2)

## 2011-01-19 DIAGNOSIS — Z930 Tracheostomy status: Secondary | ICD-10-CM

## 2011-01-19 DIAGNOSIS — G0481 Other encephalitis and encephalomyelitis: Secondary | ICD-10-CM

## 2011-01-19 DIAGNOSIS — J9600 Acute respiratory failure, unspecified whether with hypoxia or hypercapnia: Secondary | ICD-10-CM

## 2011-01-19 LAB — GLUCOSE, CAPILLARY
Glucose-Capillary: 128 mg/dL — ABNORMAL HIGH (ref 70–99)
Glucose-Capillary: 164 mg/dL — ABNORMAL HIGH (ref 70–99)
Glucose-Capillary: 124 mg/dL — ABNORMAL HIGH (ref 70–99)
Glucose-Capillary: 127 mg/dL — ABNORMAL HIGH (ref 70–99)
Glucose-Capillary: 152 mg/dL — ABNORMAL HIGH (ref 70–99)

## 2011-01-19 LAB — BASIC METABOLIC PANEL
CO2: 32 mEq/L (ref 19–32)
Calcium: 9 mg/dL (ref 8.4–10.5)
Chloride: 103 mEq/L (ref 96–112)
GFR calc Af Amer: >60 mL/min (ref >60)
Sodium: 140 mEq/L (ref 135–145)

## 2011-01-19 LAB — CULTURE, RESPIRATORY W GRAM STAIN

## 2011-01-19 LAB — URINE CULTURE

## 2011-01-20 DIAGNOSIS — R509 Fever, unspecified: Secondary | ICD-10-CM

## 2011-01-20 DIAGNOSIS — J9600 Acute respiratory failure, unspecified whether with hypoxia or hypercapnia: Secondary | ICD-10-CM

## 2011-01-20 DIAGNOSIS — G0481 Other encephalitis and encephalomyelitis: Secondary | ICD-10-CM

## 2011-01-20 DIAGNOSIS — A419 Sepsis, unspecified organism: Secondary | ICD-10-CM

## 2011-01-20 LAB — GLUCOSE, CAPILLARY
Glucose-Capillary: 146 mg/dL — ABNORMAL HIGH (ref 70–99)
Glucose-Capillary: 160 mg/dL — ABNORMAL HIGH (ref 70–99)
Glucose-Capillary: 160 mg/dL — ABNORMAL HIGH (ref 70–99)
Glucose-Capillary: 116 mg/dL — ABNORMAL HIGH (ref 70–99)

## 2011-01-20 LAB — CULTURE, BLOOD (ROUTINE X 2)
Culture  Setup Time: 201208140016
Culture: NO GROWTH
Culture: NO GROWTH

## 2011-01-21 ENCOUNTER — Inpatient Hospital Stay (HOSPITAL_COMMUNITY): Payer: Medicare Other

## 2011-01-21 LAB — GLUCOSE, CAPILLARY: Glucose-Capillary: 108 mg/dL — ABNORMAL HIGH (ref 70–99)

## 2011-01-21 LAB — CBC
HCT: 31.9 % — ABNORMAL LOW (ref 36.0–46.0)
Hemoglobin: 10.4 g/dL — ABNORMAL LOW (ref 12.0–15.0)
MCHC: 32.6 g/dL (ref 30.0–36.0)

## 2011-01-21 LAB — BASIC METABOLIC PANEL
BUN: 29 mg/dL — ABNORMAL HIGH (ref 6–23)
GFR calc non Af Amer: >60 mL/min (ref >60)
Glucose, Bld: 147 mg/dL — ABNORMAL HIGH (ref 70–99)
Potassium: 3.7 mEq/L (ref 3.5–5.1)

## 2011-01-22 LAB — GLUCOSE, CAPILLARY
Glucose-Capillary: 132 mg/dL — ABNORMAL HIGH (ref 70–99)
Glucose-Capillary: 152 mg/dL — ABNORMAL HIGH (ref 70–99)
Glucose-Capillary: 155 mg/dL — ABNORMAL HIGH (ref 70–99)

## 2011-01-22 NOTE — Op Note (Signed)
  [name], [name]               ACCOUNT NO.:  [id number]  MEDICAL RECORD NO.:  [id number]  LOCATION:  2104                         FACILITY:  [facility]  PHYSICIAN:  Nelda Bucks, MD DATE OF BIRTH:  [birth year]  DATE OF PROCEDURE: DATE OF DISCHARGE:                              OPERATIVE REPORT   PROCEDURE:  Percutaneous tracheostomy.  BRONCHOSCOPIST FOR THE PROCEDURE:  Lonia Farber, MD  Consent was obtained from the patient's son, fully aware of risks and benefits of the procedure including infection, bleeding, pneumothorax, and death.  The patient was placed in a supine position.  Chlorhexidine preparation was used to sterilize the operative site.  PREOPERATIVE DIAGNOSES:  Encephalitis, recurrent requirements for intubation, poor cough mechanism secondary to encephalitis, and status post left lung collapse secondary to pneumonia.  POSTOPERATIVE DIAGNOSIS:  Status post tracheostomy secondary to inability to protect the airway overall.  After chlorhexidine preparation was instilled, the bronchoscopist placed the bronchoscope through the endotracheal tube and pulled the endotracheal tube back to approximately 17 cm.  Lidocaine plus epinephrine 7 mL was injected into the operative site.  A 1-cm vertical incision was made over the second endotracheal space and dissection was made down the tracheal planes.  I noted the strap muscles to the right with a small caliber anterior jugular vessel on the right coming close to the operative site.  I ligated this with 4-0 sutures and obtained hemostasis easily.  After ligation of this vessel, I dissected it further down the tracheal rings.  I placed a white catheter sheath over a 18-French needle into the airway successfully noted by the airway. Then this was noted by the bronchoscopist without any pressure or wall injury.  The needle was removed and the white catheter sheath remained. I then placed a wire through the white  catheter sheath and the white catheter sheath was removed.  I then placed a 14-French punch dilator over the wire successfully in the airway and then removed it.  I then placed a progressive rhino dilator to 38-French over a glider successfully into the airway and then removed over the except for the wire and glider.  I then placed the 6 Shiley tracheostomy over 26-French dilator over the glider successfully into the airway.  Everything was removed except for the tracheostomy.  Tracheostomy suture in place. Blood loss for the procedure was approximately 1 mL.  The patient tolerated the procedure quite well without any significant hemodynamic instability.  Portable chest x-ray postop is pending.     Nelda Bucks, MD     DJF/MEDQ  D:  01/14/2011  T:  01/15/2011  Job:  [id number]  cc:   Lonia Farber, MD  Electronically Signed by Rory Percy MD on 01/22/2011 04:39:50 PM

## 2011-01-23 ENCOUNTER — Inpatient Hospital Stay (HOSPITAL_COMMUNITY): Payer: Medicare Other

## 2011-01-23 LAB — CULTURE, BLOOD (ROUTINE X 2)
Culture  Setup Time: 201208170848
Culture  Setup Time: 201208172210
Culture: NO GROWTH
Culture: NO GROWTH

## 2011-01-23 LAB — BASIC METABOLIC PANEL
CO2: 27 mEq/L (ref 19–32)
Calcium: 8.7 mg/dL (ref 8.4–10.5)
Creatinine, Ser: <0.47 mg/dL — ABNORMAL LOW (ref 0.50–1.10)
Glucose, Bld: 136 mg/dL — ABNORMAL HIGH (ref 70–99)

## 2011-01-23 LAB — CBC
HCT: 30.9 % — ABNORMAL LOW (ref 36.0–46.0)
Hemoglobin: 10.3 g/dL — ABNORMAL LOW (ref 12.0–15.0)
MCH: 28.8 pg (ref 26.0–34.0)
MCV: 86.3 fL (ref 78.0–100.0)
RBC: 3.58 MIL/uL — ABNORMAL LOW (ref 3.87–5.11)

## 2011-01-23 LAB — GLUCOSE, CAPILLARY
Glucose-Capillary: 145 mg/dL — ABNORMAL HIGH (ref 70–99)
Glucose-Capillary: 119 mg/dL — ABNORMAL HIGH (ref 70–99)
Glucose-Capillary: 151 mg/dL — ABNORMAL HIGH (ref 70–99)

## 2011-01-24 ENCOUNTER — Inpatient Hospital Stay (HOSPITAL_COMMUNITY): Payer: Medicare Other

## 2011-01-24 LAB — CBC
HCT: 29.5 % — ABNORMAL LOW (ref 36.0–46.0)
MCH: 28.9 pg (ref 26.0–34.0)
MCHC: 33.6 g/dL (ref 30.0–36.0)
MCV: 86.3 fL (ref 78.0–100.0)
Platelets: 206 10*3/uL (ref 150–400)
RDW: 18.3 % — ABNORMAL HIGH (ref 11.5–15.5)
WBC: 12.2 10*3/uL — ABNORMAL HIGH (ref 4.0–10.5)

## 2011-01-24 LAB — BASIC METABOLIC PANEL
BUN: 22 mg/dL (ref 6–23)
Calcium: 9 mg/dL (ref 8.4–10.5)
Chloride: 107 mEq/L (ref 96–112)
Creatinine, Ser: <0.47 mg/dL — ABNORMAL LOW (ref 0.50–1.10)

## 2011-01-24 LAB — GLUCOSE, CAPILLARY: Glucose-Capillary: 132 mg/dL — ABNORMAL HIGH (ref 70–99)

## 2011-01-25 ENCOUNTER — Inpatient Hospital Stay (HOSPITAL_COMMUNITY): Payer: Medicare Other

## 2011-01-25 DIAGNOSIS — Z930 Tracheostomy status: Secondary | ICD-10-CM

## 2011-01-25 DIAGNOSIS — A419 Sepsis, unspecified organism: Secondary | ICD-10-CM

## 2011-01-25 LAB — BASIC METABOLIC PANEL
CO2: 28 mEq/L (ref 19–32)
CO2: 25 mEq/L (ref 19–32)
Calcium: 8.4 mg/dL (ref 8.4–10.5)
Chloride: 104 mEq/L (ref 96–112)
Chloride: 104 mEq/L (ref 96–112)
Creatinine, Ser: <0.47 mg/dL — ABNORMAL LOW (ref 0.50–1.10)
Glucose, Bld: 121 mg/dL — ABNORMAL HIGH (ref 70–99)
Sodium: 137 mEq/L (ref 135–145)
Sodium: 135 mEq/L (ref 135–145)

## 2011-01-25 LAB — CBC
MCV: 86.6 fL (ref 78.0–100.0)
Platelets: 186 10*3/uL (ref 150–400)
RBC: 3.14 MIL/uL — ABNORMAL LOW (ref 3.87–5.11)
RDW: 18.9 % — ABNORMAL HIGH (ref 11.5–15.5)
WBC: 9.8 10*3/uL (ref 4.0–10.5)

## 2011-01-25 LAB — GLUCOSE, CAPILLARY
Glucose-Capillary: 119 mg/dL — ABNORMAL HIGH (ref 70–99)
Glucose-Capillary: 133 mg/dL — ABNORMAL HIGH (ref 70–99)
Glucose-Capillary: 131 mg/dL — ABNORMAL HIGH (ref 70–99)

## 2011-01-26 DIAGNOSIS — A419 Sepsis, unspecified organism: Secondary | ICD-10-CM

## 2011-01-26 DIAGNOSIS — J9600 Acute respiratory failure, unspecified whether with hypoxia or hypercapnia: Secondary | ICD-10-CM

## 2011-01-26 DIAGNOSIS — G0481 Other encephalitis and encephalomyelitis: Secondary | ICD-10-CM

## 2011-01-26 LAB — GLUCOSE, CAPILLARY
Glucose-Capillary: 134 mg/dL — ABNORMAL HIGH (ref 70–99)
Glucose-Capillary: 126 mg/dL — ABNORMAL HIGH (ref 70–99)
Glucose-Capillary: 127 mg/dL — ABNORMAL HIGH (ref 70–99)

## 2011-01-26 LAB — CULTURE, RESPIRATORY W GRAM STAIN

## 2011-01-27 ENCOUNTER — Inpatient Hospital Stay (HOSPITAL_COMMUNITY): Payer: Medicare Other

## 2011-01-27 DIAGNOSIS — A419 Sepsis, unspecified organism: Secondary | ICD-10-CM

## 2011-01-27 DIAGNOSIS — Z930 Tracheostomy status: Secondary | ICD-10-CM

## 2011-01-27 DIAGNOSIS — G0481 Other encephalitis and encephalomyelitis: Secondary | ICD-10-CM

## 2011-01-27 DIAGNOSIS — J9600 Acute respiratory failure, unspecified whether with hypoxia or hypercapnia: Secondary | ICD-10-CM

## 2011-01-27 LAB — GLUCOSE, CAPILLARY
Glucose-Capillary: 146 mg/dL — ABNORMAL HIGH (ref 70–99)
Glucose-Capillary: 142 mg/dL — ABNORMAL HIGH (ref 70–99)
Glucose-Capillary: 151 mg/dL — ABNORMAL HIGH (ref 70–99)
Glucose-Capillary: 108 mg/dL — ABNORMAL HIGH (ref 70–99)
Glucose-Capillary: 158 mg/dL — ABNORMAL HIGH (ref 70–99)
Glucose-Capillary: 117 mg/dL — ABNORMAL HIGH (ref 70–99)

## 2011-01-27 LAB — COMPREHENSIVE METABOLIC PANEL
AST: 57 U/L — ABNORMAL HIGH (ref 0–37)
Albumin: 2.4 g/dL — ABNORMAL LOW (ref 3.5–5.2)
Alkaline Phosphatase: 82 U/L (ref 39–117)
Chloride: 99 mEq/L (ref 96–112)
Potassium: 2.8 mEq/L — ABNORMAL LOW (ref 3.5–5.1)
Sodium: 132 mEq/L — ABNORMAL LOW (ref 135–145)
Total Bilirubin: 0.5 mg/dL (ref 0.3–1.2)
Total Protein: 6.1 g/dL (ref 6.0–8.3)

## 2011-01-27 LAB — DIFFERENTIAL
Basophils Absolute: 0 10*3/uL (ref 0.0–0.1)
Basophils Relative: 0 % (ref 0–1)
Eosinophils Absolute: 0.2 10*3/uL (ref 0.0–0.7)
Eosinophils Relative: 3 % (ref 0–5)
Lymphocytes Relative: 14 % (ref 12–46)
Monocytes Absolute: 0.5 10*3/uL (ref 0.1–1.0)

## 2011-01-27 LAB — VANCOMYCIN, TROUGH: Vancomycin Tr: 16.4 ug/mL (ref 10.0–20.0)

## 2011-01-27 LAB — CBC
MCHC: 34 g/dL (ref 30.0–36.0)
Platelets: 209 10*3/uL (ref 150–400)
RDW: 19 % — ABNORMAL HIGH (ref 11.5–15.5)
WBC: 6.9 10*3/uL (ref 4.0–10.5)

## 2011-01-27 LAB — PHOSPHORUS: Phosphorus: 3.3 mg/dL (ref 2.3–4.6)

## 2011-01-28 LAB — MAGNESIUM: Magnesium: 2.2 mg/dL (ref 1.5–2.5)

## 2011-01-28 LAB — GLUCOSE, CAPILLARY
Glucose-Capillary: 116 mg/dL — ABNORMAL HIGH (ref 70–99)
Glucose-Capillary: 127 mg/dL — ABNORMAL HIGH (ref 70–99)
Glucose-Capillary: 133 mg/dL — ABNORMAL HIGH (ref 70–99)
Glucose-Capillary: 113 mg/dL — ABNORMAL HIGH (ref 70–99)

## 2011-01-28 LAB — CBC
HCT: 29.4 % — ABNORMAL LOW (ref 36.0–46.0)
Hemoglobin: 9.9 g/dL — ABNORMAL LOW (ref 12.0–15.0)
MCV: 86 fL (ref 78.0–100.0)
RDW: 19.2 % — ABNORMAL HIGH (ref 11.5–15.5)
WBC: 7.8 10*3/uL (ref 4.0–10.5)

## 2011-01-28 LAB — BASIC METABOLIC PANEL
CO2: 27 mEq/L (ref 19–32)
Calcium: 8.7 mg/dL (ref 8.4–10.5)
Chloride: 98 mEq/L (ref 96–112)
Glucose, Bld: 127 mg/dL — ABNORMAL HIGH (ref 70–99)
Sodium: 132 mEq/L — ABNORMAL LOW (ref 135–145)

## 2011-01-28 LAB — PROTIME-INR: INR: 1.16 (ref 0.00–1.49)

## 2011-01-28 NOTE — Op Note (Signed)
  [name], [name]               ACCOUNT NO.:  [id number]  MEDICAL RECORD NO.:  [id number]  LOCATION:  2606                         FACILITY:  [facility]  PHYSICIAN:  Fayrene Fearing L. Malon Kindle., M.D.DATE OF BIRTH:  [birth year]  DATE OF PROCEDURE:  01/16/2011 DATE OF DISCHARGE:                              OPERATIVE REPORT   PROCEDURE:  Percutaneous endoscopic gastrostomy tube placement.  MEDICATIONS:  Cefazolin 1 g IV, Cetacaine spray, fentanyl 50 mcg, Versed 5 mg IV.  INDICATIONS:  Unfortunate 65 year old woman who came in with severe encephalitis resulting in respiratory failure and mechanical ventilation.  She is progressed very slowly and it is anticipated that she will need ventilation and enteral feeding for some time.  For this reason, a PEG tube placement was requested.  DESCRIPTION OF PROCEDURE:  The procedure had been discussed in detail with her son [name] and consent obtained.  In the supine position, the patient's head was caught back.  Respiratory therapy assisted and the cuff on the tracheostomy tube was let down and the endoscope passed. The cuff was then reinflated.  We followed the feeding tube down into the stomach.  A complete endoscopy was performed and the duodenal bulb was normal as was the antrum and body.  The abdomen was transilluminated the mid stomach.  The site was marked.  This was then cleaned and infiltrated with Xylocaine and a small incision made with a scalpel. The needle trocar was then placed through the incision into the stomach visually with one stick.  A snare was then placed around the needle trocar and the needle withdrawn and the guidewire threaded through the trocar and removed out of the mouth in the usual manner.  A 24-French [place] scientific push type tube was placed over the guidewire lubricated pulled out of the abdominal wall in the usual manner.  The scope was then reinserted and passed after the balloon on the tracheostomy.  Cuff was  temporarily deflated.  The internal bumper was seen to be quite snug against the gastric wall.  The external bumper was applied in a sterile dressing.  The scope was then withdrawn.  The patient tolerated the procedure well.  ASSESSMENT:  Successful PEG placement.  PLAN:  Routine post PEG orders.          ______________________________ Llana Aliment Malon Kindle., M.D.     Waldron Session  D:  01/16/2011  T:  01/16/2011  Job:  [id number]  cc:   Nelda Bucks, MD  Electronically Signed by Carman Ching M.D. on 01/28/2011 62:13:08 PM

## 2011-01-28 NOTE — Consult Note (Signed)
  [name], [name]               ACCOUNT NO.:  [id number]  MEDICAL RECORD NO.:  [id number]  LOCATION:  2606                         FACILITY:  [facility]  PHYSICIAN:  Fayrene Fearing L. Malon Kindle., M.D.DATE OF BIRTH:  [birth year]  DATE OF CONSULTATION:  01/15/2011 DATE OF DISCHARGE:                                CONSULTATION   REQUESTING PHYSICIAN:  Nelda Bucks, MD  HISTORY:  The patient is an unfortunate 65 year old woman who came in with altered mental status that was felt to be due to encephalitis.  She was admitted December 31, 2010, and intubated.  She briefly was extubated but was unable to tolerate being off the ventilator and had to be reintubated.  This ultimately led to a tracheostomy yesterday.  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  In any event she has been stable and has been improving with acyclovir but has required tube feeding.  Dr. Tyson Alias felt that she is likely to require tracheostomy and tube feeding for an extended period of time and possible mechanical ventilation as well and requests that we see her for placement of PEG tube.  CURRENT MEDICATIONS:  Acyclovir, Pepcid, folic acid, Lasix, heparin subcu 5000 units, insulin scale, Nystatin per tube, potassium replacement, thiamine, fentanyl, Versed, and Zofran.  ALLERGIES:  The patient is intolerant of PREDNISONE but no antibiotics were listed as allergies.  MEDICAL HISTORY:  Obtained from the old chart.  She has had a history of a heart murmur and kidney stones.  No other medical history is listed.  PHYSICAL EXAMINATION:  VITAL SIGNS:  Stable. GENERAL:  The patient has a tracheostomy, is off the ventilator.  She is responsive and appears to understand what I am telling.  A small lower nasogastric feeding tube is in place in her notes. HEART:   Regular rate and rhythm.  No murmurs or gallops. LUNGS:  Clear with ventilated breath sounds. ABDOMEN:  Soft, nontender, nondistended with excellent bowel sounds. There are no surgical scar seen in the epigastrium.  PERTINENT LABORATORIES:  White count 9.7, hemoglobin 11.5, platelet count 345.  Protime 2 days ago 13.6.  Electrolytes unremarkable.  ASSESSMENT:  The patient being in long-term enteral access due to severe encephalitis and respiratory failure.  PLAN:  I have discussed PEG tube with her.  She is alert and seems to understand what I am saying.  I asked her if it was okay if we place a tube and she squeezed my hand and responds yes.  We have left a message for her son, [name] to discuss this with him as well.  We have temporarily put her on schedule for the morning if we are able to contact her family successfully and discuss this with them.          ______________________________ Llana Aliment. Malon Kindle., M.D.     Waldron Session  D:  01/15/2011  T:  01/16/2011  Job:  [id number]  cc:   Nelda Bucks, MD  Electronically Signed by Carman Ching M.D. on 01/28/2011 05:29:11 PM

## 2011-01-29 DIAGNOSIS — A419 Sepsis, unspecified organism: Secondary | ICD-10-CM

## 2011-01-29 DIAGNOSIS — G0481 Other encephalitis and encephalomyelitis: Secondary | ICD-10-CM

## 2011-01-29 DIAGNOSIS — J9600 Acute respiratory failure, unspecified whether with hypoxia or hypercapnia: Secondary | ICD-10-CM

## 2011-01-29 LAB — GLUCOSE, CAPILLARY
Glucose-Capillary: 115 mg/dL — ABNORMAL HIGH (ref 70–99)
Glucose-Capillary: 149 mg/dL — ABNORMAL HIGH (ref 70–99)

## 2011-01-29 LAB — CBC
MCV: 86 fL (ref 78.0–100.0)
Platelets: 207 10*3/uL (ref 150–400)
RDW: 19.4 % — ABNORMAL HIGH (ref 11.5–15.5)
WBC: 9.8 10*3/uL (ref 4.0–10.5)

## 2011-01-29 LAB — BASIC METABOLIC PANEL
Calcium: 9 mg/dL (ref 8.4–10.5)
Chloride: 99 mEq/L (ref 96–112)
Creatinine, Ser: <0.47 mg/dL — ABNORMAL LOW (ref 0.50–1.10)
Sodium: 135 mEq/L (ref 135–145)

## 2011-01-29 LAB — PHOSPHORUS: Phosphorus: 3.1 mg/dL (ref 2.3–4.6)

## 2011-01-29 LAB — MAGNESIUM: Magnesium: 2.3 mg/dL (ref 1.5–2.5)

## 2011-01-30 DIAGNOSIS — J9600 Acute respiratory failure, unspecified whether with hypoxia or hypercapnia: Secondary | ICD-10-CM

## 2011-01-30 DIAGNOSIS — A419 Sepsis, unspecified organism: Secondary | ICD-10-CM

## 2011-01-30 DIAGNOSIS — R5381 Other malaise: Secondary | ICD-10-CM

## 2011-01-30 DIAGNOSIS — R1310 Dysphagia, unspecified: Secondary | ICD-10-CM

## 2011-01-30 DIAGNOSIS — G0481 Other encephalitis and encephalomyelitis: Secondary | ICD-10-CM

## 2011-01-30 DIAGNOSIS — A89 Unspecified viral infection of central nervous system: Secondary | ICD-10-CM

## 2011-01-30 LAB — CBC
MCH: 29 pg (ref 26.0–34.0)
MCV: 87.1 fL (ref 78.0–100.0)
Platelets: 202 10*3/uL (ref 150–400)
RDW: 19.4 % — ABNORMAL HIGH (ref 11.5–15.5)

## 2011-01-30 LAB — BASIC METABOLIC PANEL
Calcium: 8.8 mg/dL (ref 8.4–10.5)
Creatinine, Ser: <0.47 mg/dL — ABNORMAL LOW (ref 0.50–1.10)

## 2011-01-30 LAB — GLUCOSE, CAPILLARY
Glucose-Capillary: 136 mg/dL — ABNORMAL HIGH (ref 70–99)
Glucose-Capillary: 125 mg/dL — ABNORMAL HIGH (ref 70–99)

## 2011-01-31 LAB — GLUCOSE, CAPILLARY
Glucose-Capillary: 151 mg/dL — ABNORMAL HIGH (ref 70–99)
Glucose-Capillary: 138 mg/dL — ABNORMAL HIGH (ref 70–99)

## 2011-01-31 LAB — BASIC METABOLIC PANEL
Calcium: 9.1 mg/dL (ref 8.4–10.5)
GFR calc non Af Amer: >60 mL/min (ref >60)
Glucose, Bld: 138 mg/dL — ABNORMAL HIGH (ref 70–99)
Sodium: 135 mEq/L (ref 135–145)

## 2011-02-01 LAB — GLUCOSE, CAPILLARY
Glucose-Capillary: 132 mg/dL — ABNORMAL HIGH (ref 70–99)
Glucose-Capillary: 136 mg/dL — ABNORMAL HIGH (ref 70–99)

## 2011-02-02 DIAGNOSIS — Z930 Tracheostomy status: Secondary | ICD-10-CM

## 2011-02-02 LAB — GLUCOSE, CAPILLARY
Glucose-Capillary: 138 mg/dL — ABNORMAL HIGH (ref 70–99)
Glucose-Capillary: 116 mg/dL — ABNORMAL HIGH (ref 70–99)

## 2011-02-03 DIAGNOSIS — Z930 Tracheostomy status: Secondary | ICD-10-CM

## 2011-02-03 DIAGNOSIS — G0481 Other encephalitis and encephalomyelitis: Secondary | ICD-10-CM

## 2011-02-03 DIAGNOSIS — J9600 Acute respiratory failure, unspecified whether with hypoxia or hypercapnia: Secondary | ICD-10-CM

## 2011-02-03 LAB — GLUCOSE, CAPILLARY
Glucose-Capillary: 129 mg/dL — ABNORMAL HIGH (ref 70–99)
Glucose-Capillary: 122 mg/dL — ABNORMAL HIGH (ref 70–99)
Glucose-Capillary: 136 mg/dL — ABNORMAL HIGH (ref 70–99)

## 2011-02-04 ENCOUNTER — Inpatient Hospital Stay (HOSPITAL_COMMUNITY): Payer: Medicare Other

## 2011-02-04 LAB — GLUCOSE, CAPILLARY
Glucose-Capillary: 105 mg/dL — ABNORMAL HIGH (ref 70–99)
Glucose-Capillary: 121 mg/dL — ABNORMAL HIGH (ref 70–99)

## 2011-02-04 LAB — FUNGUS CULTURE W SMEAR

## 2011-02-04 LAB — BASIC METABOLIC PANEL
BUN: 28 mg/dL — ABNORMAL HIGH (ref 6–23)
Potassium: 3.6 mEq/L (ref 3.5–5.1)

## 2011-02-04 LAB — CBC
HCT: 31.8 % — ABNORMAL LOW (ref 36.0–46.0)
MCHC: 33 g/dL (ref 30.0–36.0)
RDW: 19.3 % — ABNORMAL HIGH (ref 11.5–15.5)

## 2011-02-05 LAB — GLUCOSE, CAPILLARY
Glucose-Capillary: 122 mg/dL — ABNORMAL HIGH (ref 70–99)
Glucose-Capillary: 137 mg/dL — ABNORMAL HIGH (ref 70–99)

## 2011-02-15 NOTE — Discharge Summary (Addendum)
[name], [name]               ACCOUNT NO.:  [id number]  MEDICAL RECORD NO.:  [id number]  LOCATION:  4507                         FACILITY:  [facility]  PHYSICIAN:  Charlaine Dalton. Sherene Sires, MD, FCCPDATE OF BIRTH:  Jul 31, 1945  DATE OF ADMISSION:  12/31/2010 DATE OF DISCHARGE:  02/05/2011                              DISCHARGE SUMMARY   DISCHARGE DIAGNOSES: 1. 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 2. Acute respiratory failure secondary to aspiration pneumonia in the     setting of #1. 3. Systemic inflammatory response syndrome secondary to #2. 4. Hyponatremia. 5. Dysphasia. 6. Hyperglycemia. 7. Anemia.  LINES/TUBES: 1. Oral endotracheal tube from January 03, 2011, through January 05, 2011. 2. Left IJ central line from January 03, 2011, which has since been     removed. 3. Right upper extremity PICC, which was placed on January 21, 2011,     and removed at the time of discharge. 4. Radial A-line on January 03, 2011, and has since been removed. 5. Oral ET tube from January 06, 2011, through January 14, 2011. 6. Tracheostomy placement on January 15, 2011.  MICRO/SEPSIS MARKERS: 1. Urine culture from December 31, 2010, is negative. 2. Blood cultures x2 on January 02, 2011, is negative. 3. Sputum culture from January 02, 2011, is normal flora. 4. December 31, 2010, [facility] spotted fever is negative. 5. Bronchoalveolar lavage on January 06, 2011, is negative. 6. January 13, 2011, blood cultures x2 is negative. 7. Urinalysis on January 13, 2011, is negative. 8. Bronchoalveolar lavage on the left on January 14, 2011, demonstrates     rare Candida. 9. January 17, 2011, sputum demonstrates few yeast. 10.Blood cultures x2 on January 17, 2011, is negative. 11.January 17, 2011, urine culture demonstrates yeast. 12.January 23, 2011, sputum culture demonstrates normal flora.  ANTIBIOTIC DATA: 1. Vancomycin for questionable HCAP from January 02, 2011, until January 07, 2011,  which was restarted on January 17, 2011, empirically for     fever until January 22, 2011. 2. Zosyn for questionable HCAP, started on H1 January 07, 2011, was     restarted on January 17, 2011, empirically for fevers, continued     until January 22, 2011. 3. Primaxin January 07, 2011, which is currently off. 4. Doxycycline from January 02, 2011, through January 06, 2011. 5. Levaquin January 07, 2011. 6. Acyclovir January 02, 2011, through January 23, 2011. 7. Vancomycin from January 23, 2011, through January 27, 2011. 8. Zosyn from January 23, 2011, through January 27, 2011.  BEST PRACTICE: 1. The patient was placed on Lovenox initially, but was stopped     secondary to tracheostomy site bleeding and was transiently placed     on SCDs for DVT prophylaxis.  Heparin subcutaneously was resumed on     January 27, 2011, without further evidence of bleeding. 2. The patient was placed on Pepcid for stress ulcer prophylaxis.  CONSULTANTS: 1. Internal Medicine Teaching Service. 2. Infectious Disease.  KEY EVENTS/STUDIES: 1. January 02, 2011, echocardiogram demonstrates severe mitral regurg     with a normal left ventricular ejection fraction. 2. On January 06, 2011, the patient has noted  continued hypoxia and     tachycardia.  She did require reintubation and underwent     bronchoscopy. 3. January 13, 2011, the patient was noted to still have a very poor     neuro status. 4. January 14, 2011, improved fever curve noted. 5. January 17, 2011, the patient was noted to have bleeding from     tracheostomy site, tolerating pressure support of 12. 6. January 24, 2011, the patient was noted to be tolerating aerosolized     trach collar for 48 hours. 7. January 29, 2011, ATC x48 hours no tachypnea, stable respiratory     status, tracheostomy was changed and the patient underwent swallow     evaluation. 8. January 30, 2011, negative balance achieved.  LABORATORY DATA:  February 04, 2011, TSH is 5.216.  February 04, 2011, BMP  demonstrates sodium 137, potassium 3.6, chloride 102, CO2 of 28, glucose 126, BUN 28, creatinine less than 0.47, calcium 9.0.  February 04, 2011, CBC demonstrates WBC 8.4, hemoglobin 10.5, hematocrit 31.8, platelet count 198.  RADIOLOGIC DATA:  February 04, 2011, chest x-ray demonstrates improved lung volumes and bibasilar aeration.  HISTORY OF PRESENT ILLNESS:  [name] is a 65 year old white female with no known medical history who presented to [facility] 3-4 days prior to admission to [facility] with malaise, fevers, nausea, weakness, headache, and vomiting.  She also at that time c/o left flank pain, anorexia, and difficulty sleeping as well as diarrhea.  She presented to [facility] on the Sunday prior to admission at [facility].  She was diagnosed with urinary tract infection and given a prescription for ciprofloxacin.  Unfortunately, she became more confused and weak and re-presented to [facility] where a CT of the head and lumbar puncture were performed.  CT was negative for acute intracranial abnormality and a lumbar puncture was apparently negative with Gram stain showing no organisms or white blood cells.  Also of note at that time, she was evaluated by an oral surgeon who noted an abscess in one of her teeth.  She subsequently was discharged from [facility] and presented to [facility] on December 31, 2010, at which time she was admitted by [facility] and was noted to have significant hyponatremia with a sodium of 123, altered mental status, fevers, and urinary tract infection.  On the night of January 02, 2011, she developed worsening hypoxia and altered mental status and was transferred to the intensive care unit for closer observation. Unfortunately on January 03, 2011, she continued to decline and did require oral endotracheal intubation for respiratory support.  At that time, it was felt that she likely had a possible aspiration  event in the setting of altered mental status.  She was treated empirically for aspiration pneumonia as well as her known urinary tract infection.  She did undergo an MRI on January 03, 2011, which demonstrated extensive T2 signal abnormality throughout the right temporal lobe, right insular cortex, and inferior medial right frontal lobe consistent with regional encephalitis.  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  She was empirically on admission placed on doxycycline and acyclovir.  She completed full course of acyclovir for HSV encephalitis and doxycycline empirically for [facility] spotted fever.  Throughout her intensive care stay, she did demonstrate evidence of SIRS secondary to probable aspiration pneumonia.  She did not require vasoactive agents for blood pressure support.  She was extubated on the 5th and unfortunately required re-intubation on the 6th and underwent percutaneous tracheostomy on January 15, 2011.  Post tracheostomy placement, [name] was slow with the weaning process, but was able to be liberated from mechanical ventilation and is currently on 28% aerosolized trach collar.  Secondary to HSV encephalitis and prolonged acute respiratory failure, [name] does have severe deconditioning and was evaluated by Speech Therapy, Physical Therapy, and Occupational Therapy.  She did have noted hyperglycemia during ICU stay and was placed on sliding scale insulin to achieve normal glucose control.  Also noted, she transiently did have bleeding from tracheostomy site and Lovenox was held.  She was able to be restarted on heparin without further evidence of bleeding, was thought bleeding was secondary to suctioned trauma in the setting of Lovenox administration.  Currently [name] has made significant improvement.  She is two-person assist with pivot steps to  bed from bed to chair.  She currently is tolerating Passy-Muir valve with supervision.  HOSPITAL COURSE BY DISCHARGE DIAGNOSES: 1. 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  Please see     history of present illness for details.  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     She has completed full course of acyclovir and mental status     currently is significantly improved. 2. Acute respiratory failure secondary to presumed aspiration     pneumonia in the setting of #1.  [name] did have hypoxia with     respiratory failure requiring intubation.  She was extubated on the     fifth and unfortunately required reintubation and subsequent     percutaneous tracheostomy placement.  Post percutaneous     tracheostomy placement, she made significant strides with weaning     from mechanical ventilation and has been liberated from mechanical     vent and is currently on 28% ATC 24/7. 3. Systemic inflammatory response syndrome secondary to #2.  This was     transient and noted during ICU stay.  She has completed full course     of antibiotic therapy and antiviral therapy. 4. Hyponatremia secondary to vomiting and poor p.o. intake prior to     admission.  This has resolved during hospitalization. 5. Dysphagia secondary to percutaneous tracheostomy placement and    prolonged mechanical ventilation status post PEG placement.  She     currently is working with Speech Therapy and still has severe     pharyngeal dysphagia with gross silent aspiration with thickened     liquids.  It is recommended she continue to be n.p.o. with PEG as a     primary nutritional source. 6. Hyperglycemia, resolved.  She was placed on sliding scale insulin     transiently during ICU stay to achieve normal glucose control.  She     currently is off sliding scale insulin. 7. Anemia in the setting of critical illness and acute blood loss from     tracheostomy.   [name] did not require transfusion during     hospitalization and hemoglobin/hematocrit are currently stable at     the time of discharge dictation.  DISCHARGE INSTRUCTIONS: 1. Activity as tolerated.  She will continue to need intensive PT and     OT. 2. Diet.  Osmolite at 40 mL per hour with ProSource nutritional     supplement. 3. Followup.  She is recommended to follow up per instructions of  skilled nursing facility medical team.  DISCHARGE MEDICATIONS: 1. Tylenol 325 mg per tube every 6 hours as needed. 2. Bisacodyl 10 mg rectally daily as needed for constipation. 3. Chlorhexidine 15 mL rinse twice daily. 4. Pepcid 20 mg per tube daily. 5. Folic acid 1 mg per tube daily. 6. Heparin 5000 units subcutaneously every 8 hours. 7. Osmolite nutritional supplement 40 mL per hour. 8. Nystatin 100,000 units/mL, 5 mL by mouth every 6 hours. 9. Zofran 4 mg by mouth every 4 hours as needed. 10.KCl 20 mEq per tube daily. 11.ProSource liquid supplement 30 mL per tube twice daily. 12.Senokot-S 2 tablets by mouth twice daily as needed. 13.Thiamine 100 mg per tube daily.  DISPOSITION AT THE TIME OF DISCHARGE:  [name] has met maximum benefit of inpatient therapy and is currently medically stable and cleared for discharge to skilled nursing facility of choice.  TIME SPENT ON DISPOSITION:  Greater than 35 minutes.     Canary Brim, NP   ______________________________ Charlaine Dalton. Sherene Sires, MD, FCCP BO/MEDQ  D:  02/05/2011  T:  02/05/2011  Job:  [id number]  cc:   Skilled Nursing Facility of Choice  Electronically Signed by Sandrea Hughs MD FCCP on 02/15/2011 08:18:51 AM Electronically Signed by Canary Brim  on 02/17/2011 05:45:40 PM

## 2011-02-21 LAB — AFB CULTURE WITH SMEAR (NOT AT ARMC): Acid Fast Smear: NONE SEEN

## 2011-03-13 ENCOUNTER — Telehealth: Payer: Self-pay | Admitting: Internal Medicine

## 2011-03-17 NOTE — Telephone Encounter (Signed)
I called [facility] to speak with crystal to see if this has been taking care of but is out of the office for the week. The lady I spoke with states she will get Judie Grieve to call our office to see if this has been taking care of. Will await for bryan call back

## 2011-03-18 NOTE — Telephone Encounter (Signed)
atc # provided above x 3 but line is busy.  wcb

## 2011-03-19 NOTE — Telephone Encounter (Signed)
LMOVMTCB x1 for Judie Grieve to return our call.

## 2011-03-20 NOTE — Telephone Encounter (Signed)
I spoke with crystal at [facility] center. She states the pt needs to see a doc here to discuss whether the trach is going to be a long-term trach or what the plan is. Dr. Tyson Alias placed the trach, so pt set to see MW on 03/27/11 at 9:30am. Carron Curie, CMA

## 2011-03-26 ENCOUNTER — Encounter: Payer: Self-pay | Admitting: Internal Medicine

## 2011-03-27 ENCOUNTER — Encounter: Payer: Self-pay | Admitting: Internal Medicine

## 2011-03-27 ENCOUNTER — Ambulatory Visit (INDEPENDENT_AMBULATORY_CARE_PROVIDER_SITE_OTHER): Payer: Medicare Other | Admitting: Internal Medicine

## 2011-03-27 VITALS — BP 124/80 | HR 103 | Temp 97.8°F

## 2011-03-27 DIAGNOSIS — J9600 Acute respiratory failure, unspecified whether with hypoxia or hypercapnia: Secondary | ICD-10-CM

## 2011-03-27 DIAGNOSIS — J9611 Chronic respiratory failure with hypoxia: Secondary | ICD-10-CM | POA: Insufficient documentation

## 2011-03-27 NOTE — Patient Instructions (Addendum)
[name] should be plugged overnight on nasal 02 while monitoring pulse ox and if do fine without the need for suctioning or removing the trach then it can be removed safely - prefer this be done on a Monday, not at the end of the week.  Return to this clinic as needed - we also have a clinic in [place] now, just call our main number here if you want to schedule

## 2011-03-27 NOTE — Assessment & Plan Note (Signed)
Given excellent cough mechanics and ability to breath with trach plugged today I believe she's ready for a trial extubation  See instructions for specific recommendations which were reviewed directly with the patient who was given a copy with highlighter outlining the key components.

## 2011-03-27 NOTE — Progress Notes (Signed)
  Subjective:    Patient ID: [name], female    DOB: [birth year], 65 y.o.   MRN: [id number]  HPI  10 yowf minimal smoking hx and enjoyed relatively good health until presented to [facility] 3-4  days prior to admission to [facility] with malaise, fevers, nausea,  weakness, headache, and vomiting.  DATE OF [place]  ADMISSION: 12/31/2010  DATE OF DISCHARGE: 02/05/2011  DISCHARGE SUMMARY  DISCHARGE DIAGNOSES:  1. 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  2. Acute respiratory failure secondary to aspiration pneumonia in the  setting of #1.  3. Systemic inflammatory response syndrome secondary to #2.  4. Hyponatremia.  5. Dysphasia.  6. Hyperglycemia.  7. Anemia.  LINES/TUBES:  1. Oral endotracheal tube from January 03, 2011, through January 05, 2011.  2. Left IJ central line from January 03, 2011, which has since been  removed.  3. Right upper extremity PICC, which was placed on January 21, 2011,  and removed at the time of discharge.  4. Radial A-line on January 03, 2011, and has since been removed.  5. Oral ET tube from January 06, 2011, through January 14, 2011.  6. Tracheostomy placement on January 15, 2011.   03/27/2011 Initial pulmonary office eval ? Ok to pull trach able to tol 2lpm during the day with pmv and wears t collar at night but good cough effort and not requiring suctioning. Able to walk on NP x 75 ft in snf. Starting to eat without difficulty as well.   Sleeping ok without nocturnal  or early am exacerbation  of respiratory  c/o's or need for noct saba. Also denies any obvious fluctuation of symptoms with weather or environmental changes or other aggravating or alleviating factors except as outlined above      Review of Systems  Constitutional: Positive for unexpected weight change. Negative for fever.  HENT: Positive for congestion, trouble swallowing and dental problem. Negative for ear pain, nosebleeds, sore throat,  rhinorrhea, sneezing and postnasal drip.   Eyes: Negative for redness and itching.  Respiratory: Positive for cough and shortness of breath.   Cardiovascular: Positive for palpitations and leg swelling.  Gastrointestinal: Negative for nausea and vomiting.  Genitourinary: Negative for dysuria.  Musculoskeletal: Negative for joint swelling.  Skin: Negative for rash.  Neurological: Negative for headaches.  Psychiatric/Behavioral: Positive for dysphoric mood. The patient is nervous/anxious.        Objective:   Physical Exam  Stretcher bound alert thin wf with pmv in place, alert with excellent cough mechanics  HEENT mild turbinate edema.  Oropharynx no thrush or excess pnd or cobblestoning.  No JVD or cervical adenopathy. Mild accessory muscle hypertrophy. Trachea midline, nl thryroid. Chest was nl inflation by percussion with slt  diminished breath sounds and minimal  increased exp time without wheeze. Hoover sign positive at very end of  inspiration. Regular rate and rhythm without murmur gallop or rub or increase P2 or edema.  Abd: no hsm, nl excursion. Ext warm without cyanosis or clubbing.        Assessment & Plan:

## 2011-08-28 LAB — COMPREHENSIVE METABOLIC PANEL
Albumin: 3.9 g/dL (ref 3.4–5.0)
Alkaline Phosphatase: 79 U/L (ref 50–136)
Bilirubin,Total: 0.5 mg/dL (ref 0.2–1.0)
Chloride: 102 mmol/L (ref 98–107)
Co2: 19 mmol/L — ABNORMAL LOW (ref 21–32)
Creatinine: 0.84 mg/dL (ref 0.60–1.30)
Osmolality: 270 (ref 275–301)
Potassium: 3.5 mmol/L (ref 3.5–5.1)

## 2011-08-28 LAB — CBC
HGB: 13.9 g/dL (ref 12.0–16.0)
MCH: 27.9 pg (ref 26.0–34.0)
RDW: 14.4 % (ref 11.5–14.5)

## 2011-08-29 ENCOUNTER — Inpatient Hospital Stay: Payer: Self-pay | Admitting: Internal Medicine

## 2011-08-29 LAB — URINALYSIS, COMPLETE
Glucose,UR: NEGATIVE mg/dL (ref 0–75)
Protein: NEGATIVE
RBC,UR: 5 /HPF (ref 0–5)
Specific Gravity: 1.009 (ref 1.003–1.030)
Squamous Epithelial: <1
WBC UR: 8 /HPF (ref 0–5)

## 2011-08-29 LAB — DRUG SCREEN, URINE
Amphetamines, Ur Screen: NEGATIVE (ref ?–1000)
Benzodiazepine, Ur Scrn: NEGATIVE (ref ?–200)
Cannabinoid 50 Ng, Ur ~~LOC~~: NEGATIVE (ref ?–50)
Cocaine Metabolite,Ur ~~LOC~~: NEGATIVE (ref ?–300)
Opiate, Ur Screen: NEGATIVE (ref ?–300)

## 2011-08-29 LAB — CK TOTAL AND CKMB (NOT AT ARMC)
CK, Total: 39 U/L (ref 21–215)
CK, Total: 45 U/L (ref 21–215)
CK-MB: 3.3 ng/mL (ref 0.5–3.6)

## 2011-08-29 LAB — FOLATE: Folic Acid: 41.5 ng/mL (ref 3.1–100.0)

## 2011-08-29 LAB — LIPID PANEL
Cholesterol: 222 mg/dL — ABNORMAL HIGH (ref 0–200)
Ldl Cholesterol, Calc: 142 mg/dL — ABNORMAL HIGH (ref 0–100)
VLDL Cholesterol, Calc: 25 mg/dL (ref 5–40)

## 2011-08-29 LAB — TSH: Thyroid Stimulating Horm: 19.1 u[IU]/mL — ABNORMAL HIGH

## 2011-08-30 ENCOUNTER — Ambulatory Visit: Payer: Self-pay | Admitting: Neurology

## 2011-08-30 LAB — CBC WITH DIFFERENTIAL/PLATELET
Basophil #: 0 10*3/uL (ref 0.0–0.1)
Basophil %: 0.3 %
Eosinophil #: 0 10*3/uL (ref 0.0–0.7)
MCH: 28.2 pg (ref 26.0–34.0)
MCHC: 34.4 g/dL (ref 32.0–36.0)
MCV: 82 fL (ref 80–100)
Monocyte #: 0.5 10*3/uL (ref 0.0–0.7)
Neutrophil #: 5.8 10*3/uL (ref 1.4–6.5)
RDW: 14.6 % — ABNORMAL HIGH (ref 11.5–14.5)

## 2011-08-30 LAB — BASIC METABOLIC PANEL
Anion Gap: 12 (ref 7–16)
Calcium, Total: 8.5 mg/dL (ref 8.5–10.1)
Chloride: 96 mmol/L — ABNORMAL LOW (ref 98–107)
EGFR (African American): >60

## 2011-08-31 LAB — CBC WITH DIFFERENTIAL/PLATELET
Eosinophil %: 0.5 %
HGB: 13 g/dL (ref 12.0–16.0)
Monocyte #: 0.4 10*3/uL (ref 0.0–0.7)
Monocyte %: 7 %
Platelet: 151 10*3/uL (ref 150–440)
RBC: 4.64 10*6/uL (ref 3.80–5.20)
RDW: 14.4 % (ref 11.5–14.5)
WBC: 5.2 10*3/uL (ref 3.6–11.0)

## 2011-08-31 LAB — BASIC METABOLIC PANEL
EGFR (African American): >60
EGFR (Non-African Amer.): >60
Glucose: 83 mg/dL (ref 65–99)
Potassium: 3.7 mmol/L (ref 3.5–5.1)
Sodium: 139 mmol/L (ref 136–145)

## 2011-09-01 LAB — TSH: Thyroid Stimulating Horm: 3.38 u[IU]/mL

## 2011-09-01 LAB — BASIC METABOLIC PANEL
Anion Gap: 9 (ref 7–16)
Chloride: 106 mmol/L (ref 98–107)
Co2: 24 mmol/L (ref 21–32)
Creatinine: 0.8 mg/dL (ref 0.60–1.30)
EGFR (Non-African Amer.): >60
Glucose: 88 mg/dL (ref 65–99)
Potassium: 3.7 mmol/L (ref 3.5–5.1)
Sodium: 139 mmol/L (ref 136–145)

## 2011-09-15 ENCOUNTER — Ambulatory Visit: Payer: Self-pay | Admitting: Neurology

## 2012-02-09 ENCOUNTER — Ambulatory Visit: Payer: Self-pay | Admitting: Gastroenterology

## 2012-04-26 ENCOUNTER — Ambulatory Visit: Payer: Self-pay | Admitting: Family Medicine

## 2012-06-18 IMAGING — CR DG CHEST 1V PORT
1 series · 1 of 1 positions shown · non-contrast
Comparison: Chest x-ray 01/06/2011.

CLINICAL DATA: Respiratory distress.

PORTABLE CHEST - 1 VIEW

[AP]
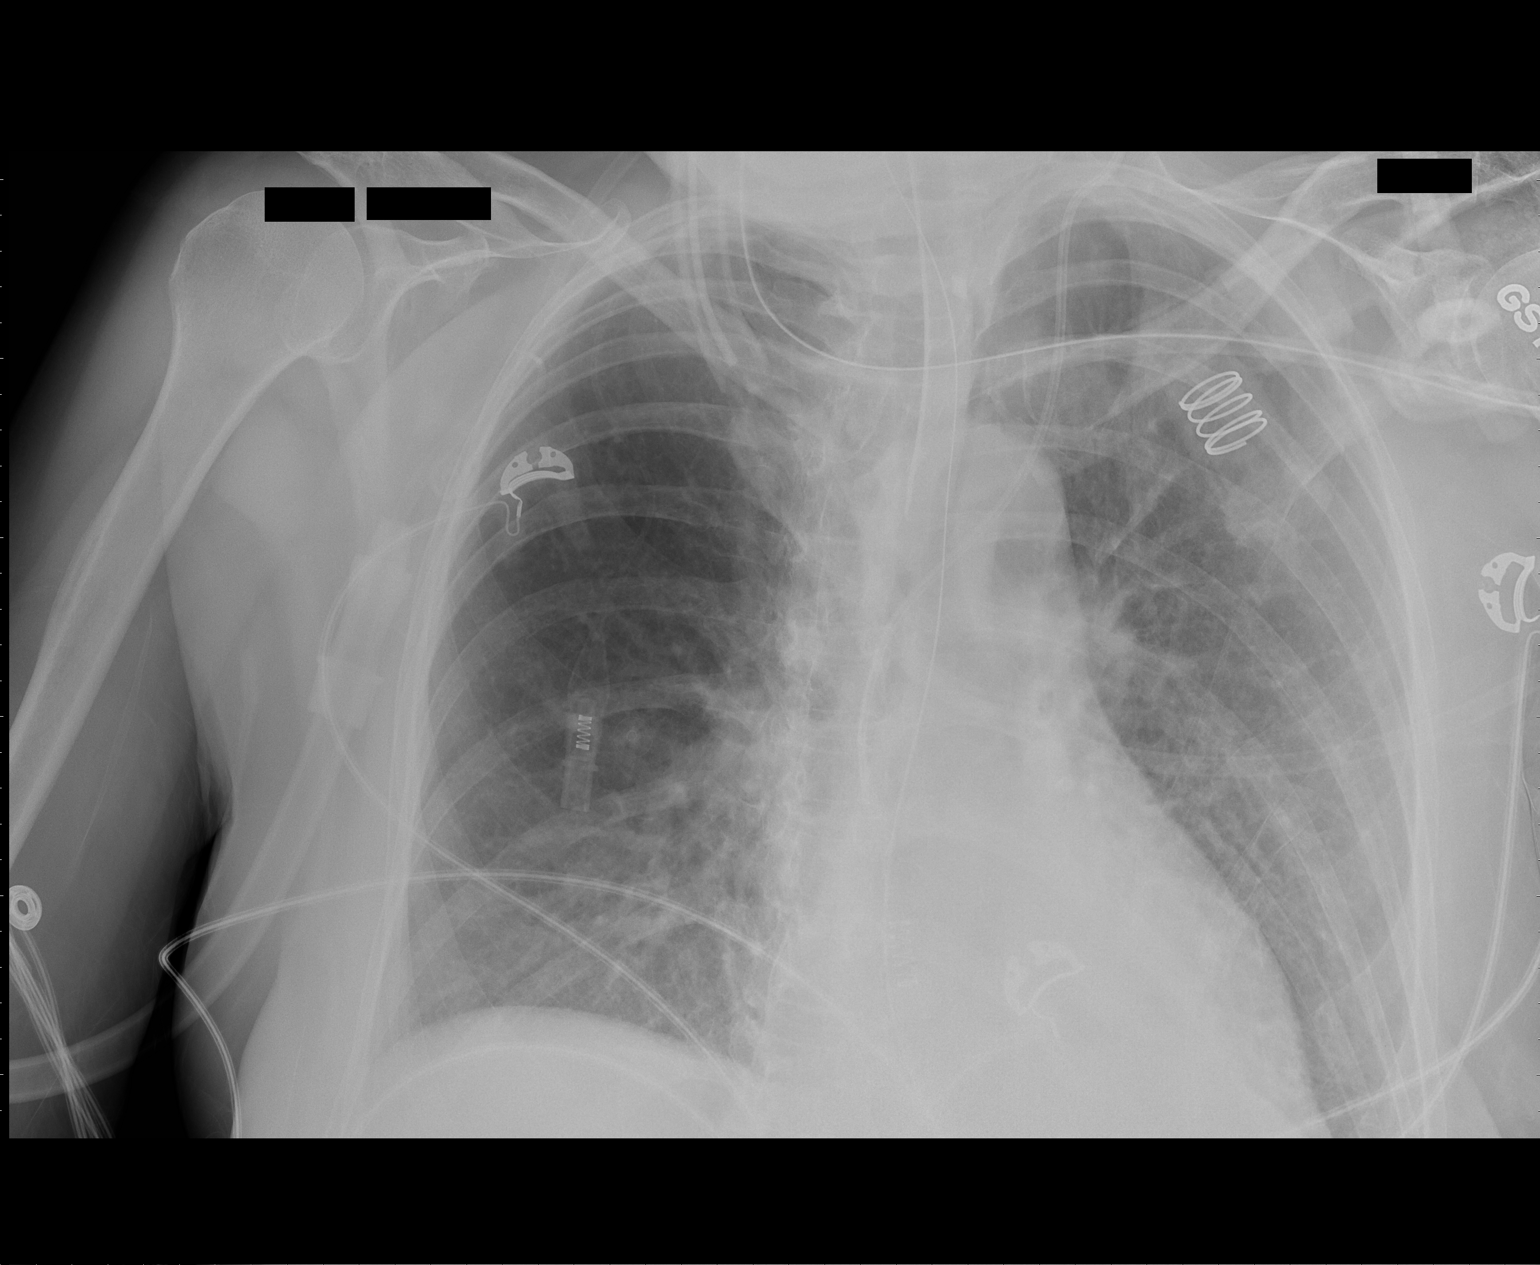

[1 of 1 positions shown; findings below may reference images not displayed]

FINDINGS: The support apparatus is stable with the addition of a NG
tube.  The cardiac silhouette, mediastinal and hilar contours are
stable.  There is improved left lower lobe lung aeration with
resolving atelectasis or infiltrate.  No definite pleural effusions
or pneumothorax.
IMPRESSION: 1.  Stable support apparatus.
2.  Improved left lung aeration.

## 2012-06-20 IMAGING — CR DG CHEST 1V PORT
1 series · 1 of 1 positions shown · non-contrast
Comparison: Chest x-ray 01/09/2011.

CLINICAL DATA: Respiratory distress.

PORTABLE CHEST - 1 VIEW

[AP]
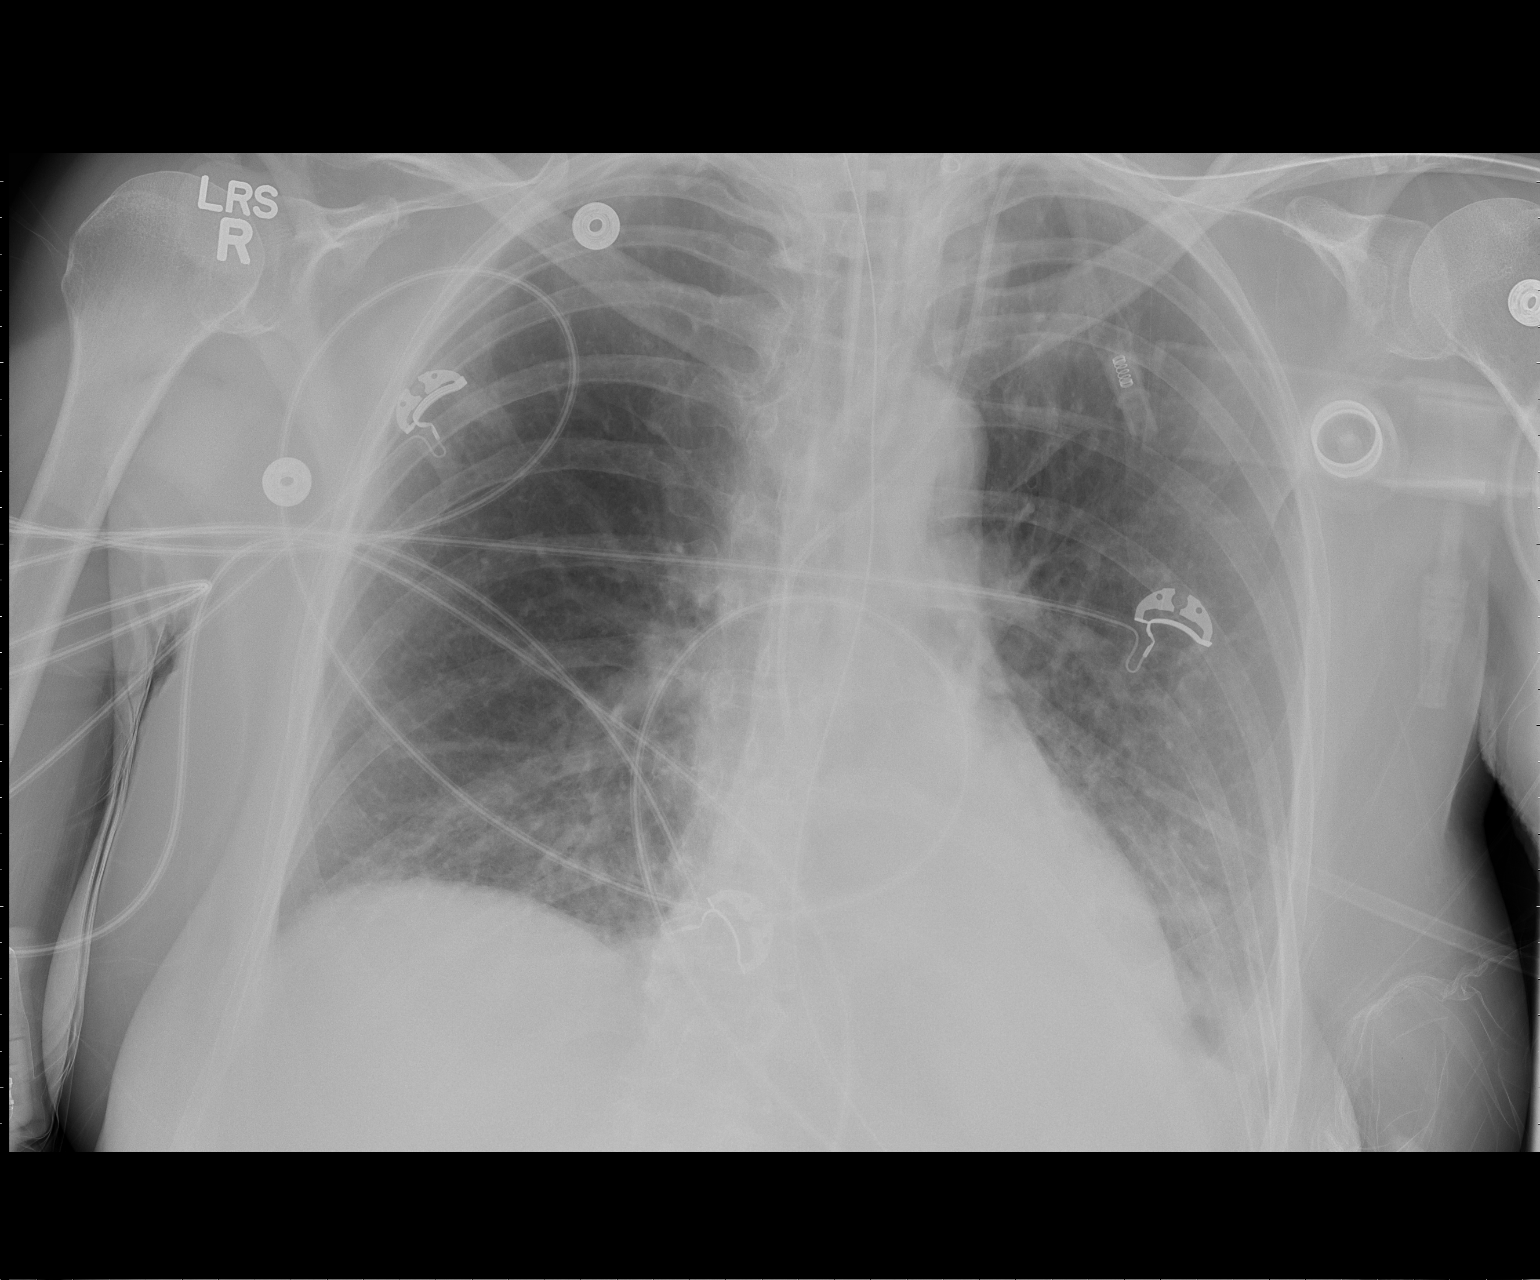

[1 of 1 positions shown; findings below may reference images not displayed]

FINDINGS: The cardiac silhouette, mediastinal and hilar contours
are stable.  Slightly lower lung volumes with increase and vascular
crowding and bibasilar atelectasis.  The left pleural effusion
persists.

The support apparatus is stable.
IMPRESSION: 1.  Stable support apparatus.
2.  Persistent vascular crowding, bibasilar atelectasis and a small
left effusion.

## 2012-06-22 IMAGING — CR DG CHEST 1V PORT
1 series · 1 of 1 positions shown · non-contrast
Comparison: the previous day's study

CLINICAL DATA: Respiratory distress

PORTABLE CHEST - 1 VIEW

[view not recorded]
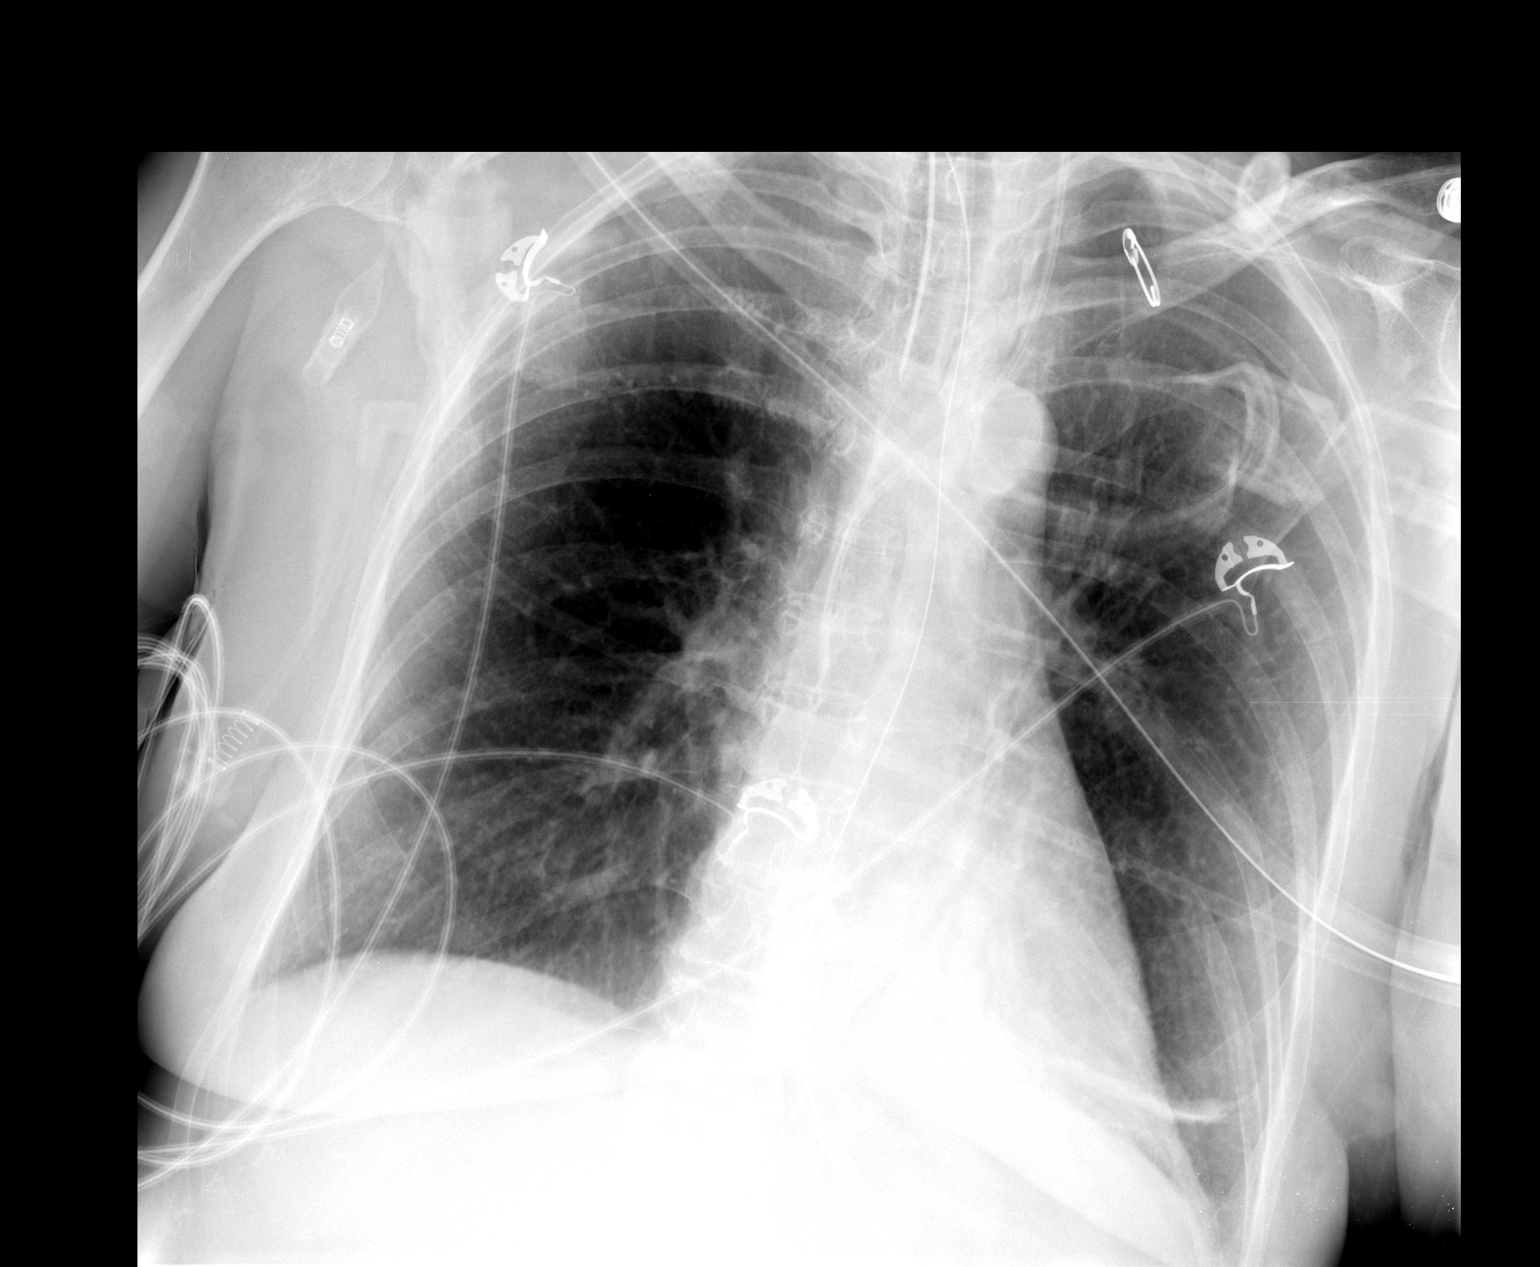

[1 of 1 positions shown; findings below may reference images not displayed]

FINDINGS: Endotracheal tube, nasogastric tube, left IJ central line
stable in position.  Left retrocardiac consolidation / atelectasis
slightly improved.  Right lung clear.  No effusion.  Heart size
normal.
IMPRESSION: 1.  Some interval decrease in left retrocardiac consolidation /
atelectasis.
2. Support hardware stable in position.

## 2012-06-24 IMAGING — CR DG CHEST 1V PORT
1 series · 1 of 1 positions shown · non-contrast
Comparison: Single view chest [DATE] [DATE] a.m.

CLINICAL DATA: Status post placement of a tracheostomy tube.

PORTABLE CHEST - 1 VIEW

[AP]
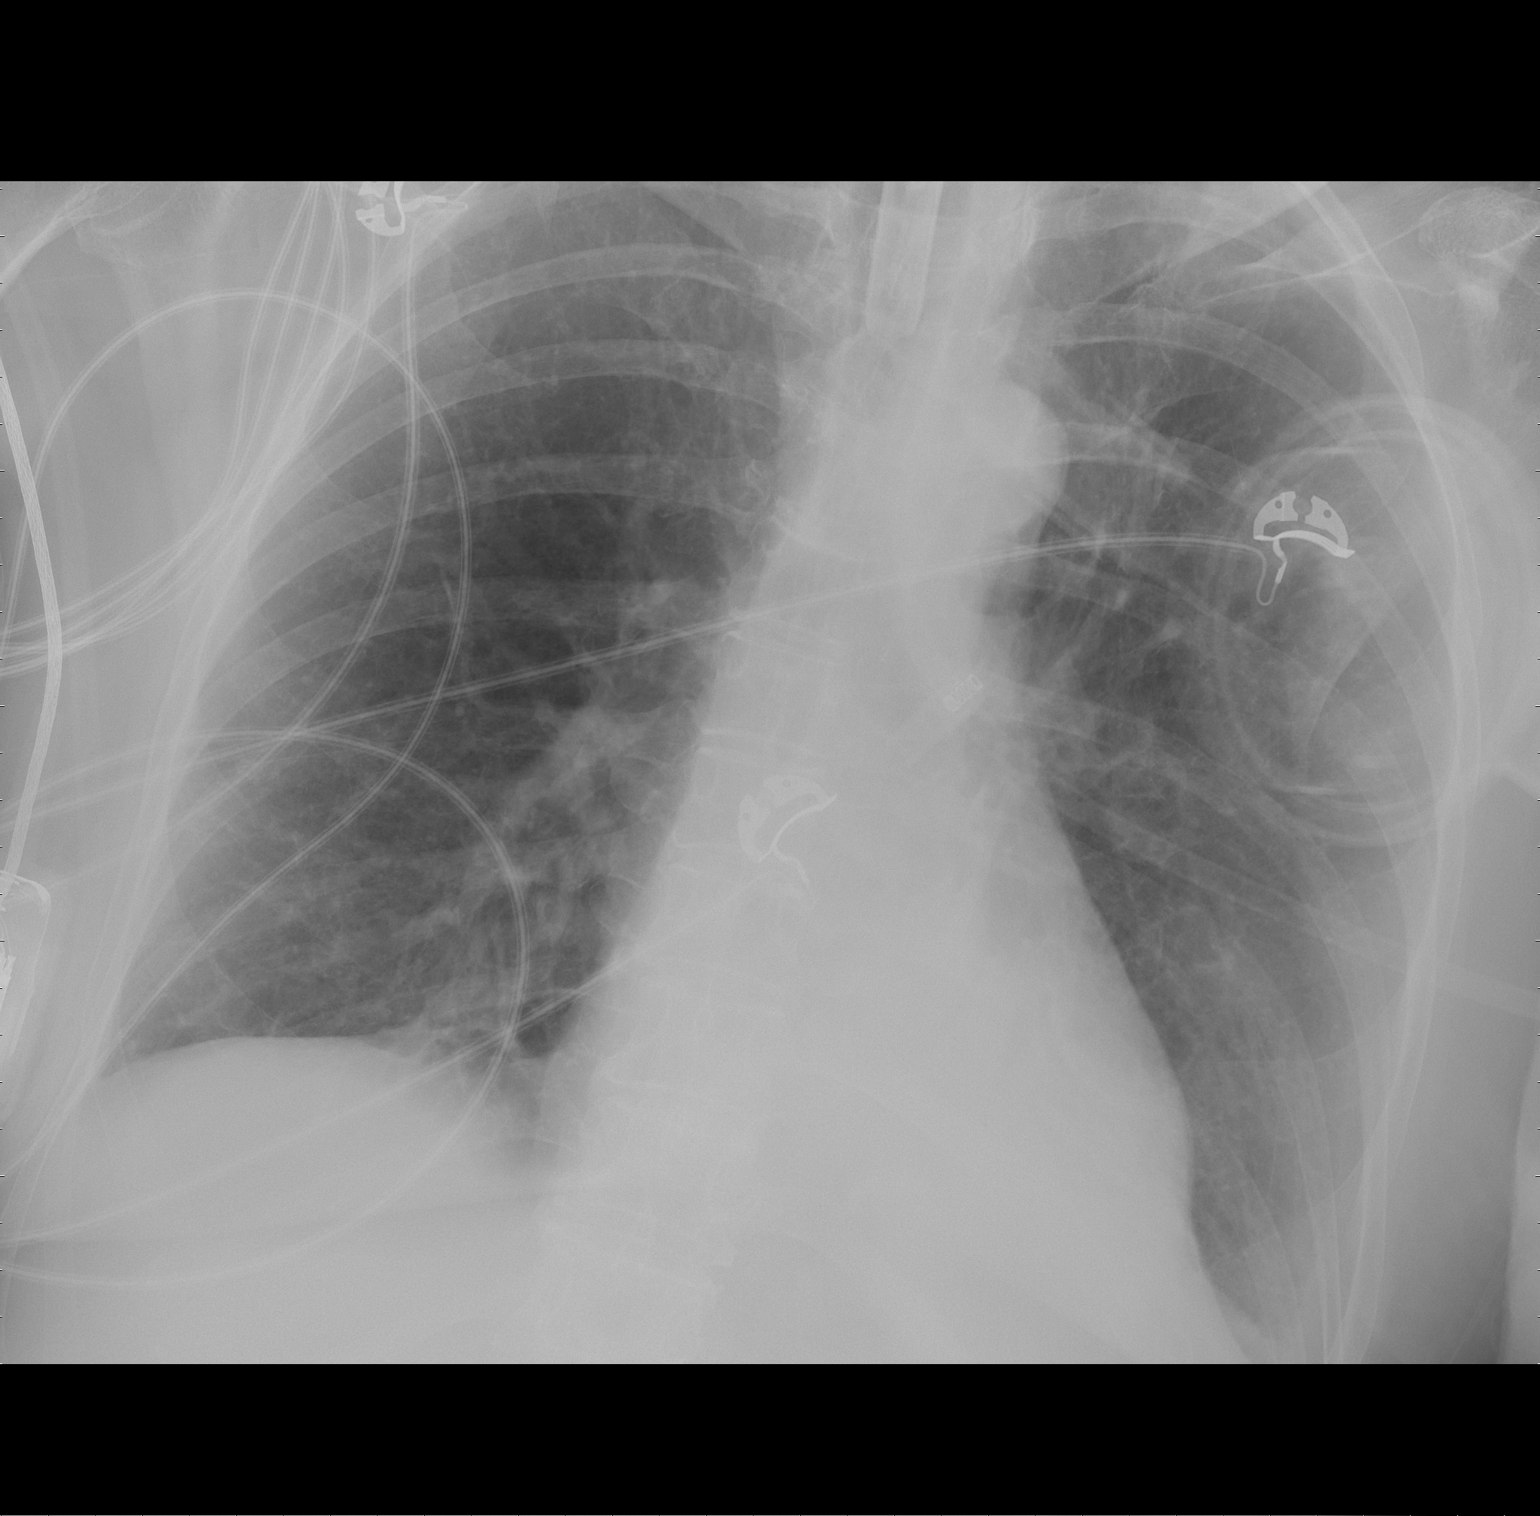

[1 of 1 positions shown; findings below may reference images not displayed]

FINDINGS: The tracheostomy tube has been placed.  It is in
satisfactory position.  The NG tube was removed.  The heart size is
normal.  Residual medial left lower lobe airspace disease persists.
IMPRESSION: 1.  Interval placement of a tracheostomy tube without radiographic
evidence for complication.
2.  Persistent left basilar airspace disease.

## 2012-06-26 IMAGING — CR DG CHEST 1V PORT
1 series · 1 of 1 positions shown · non-contrast
Comparison: [DATE] and 01/14/2011

CLINICAL DATA: Atelectasis.

PORTABLE CHEST - 1 VIEW

[AP]
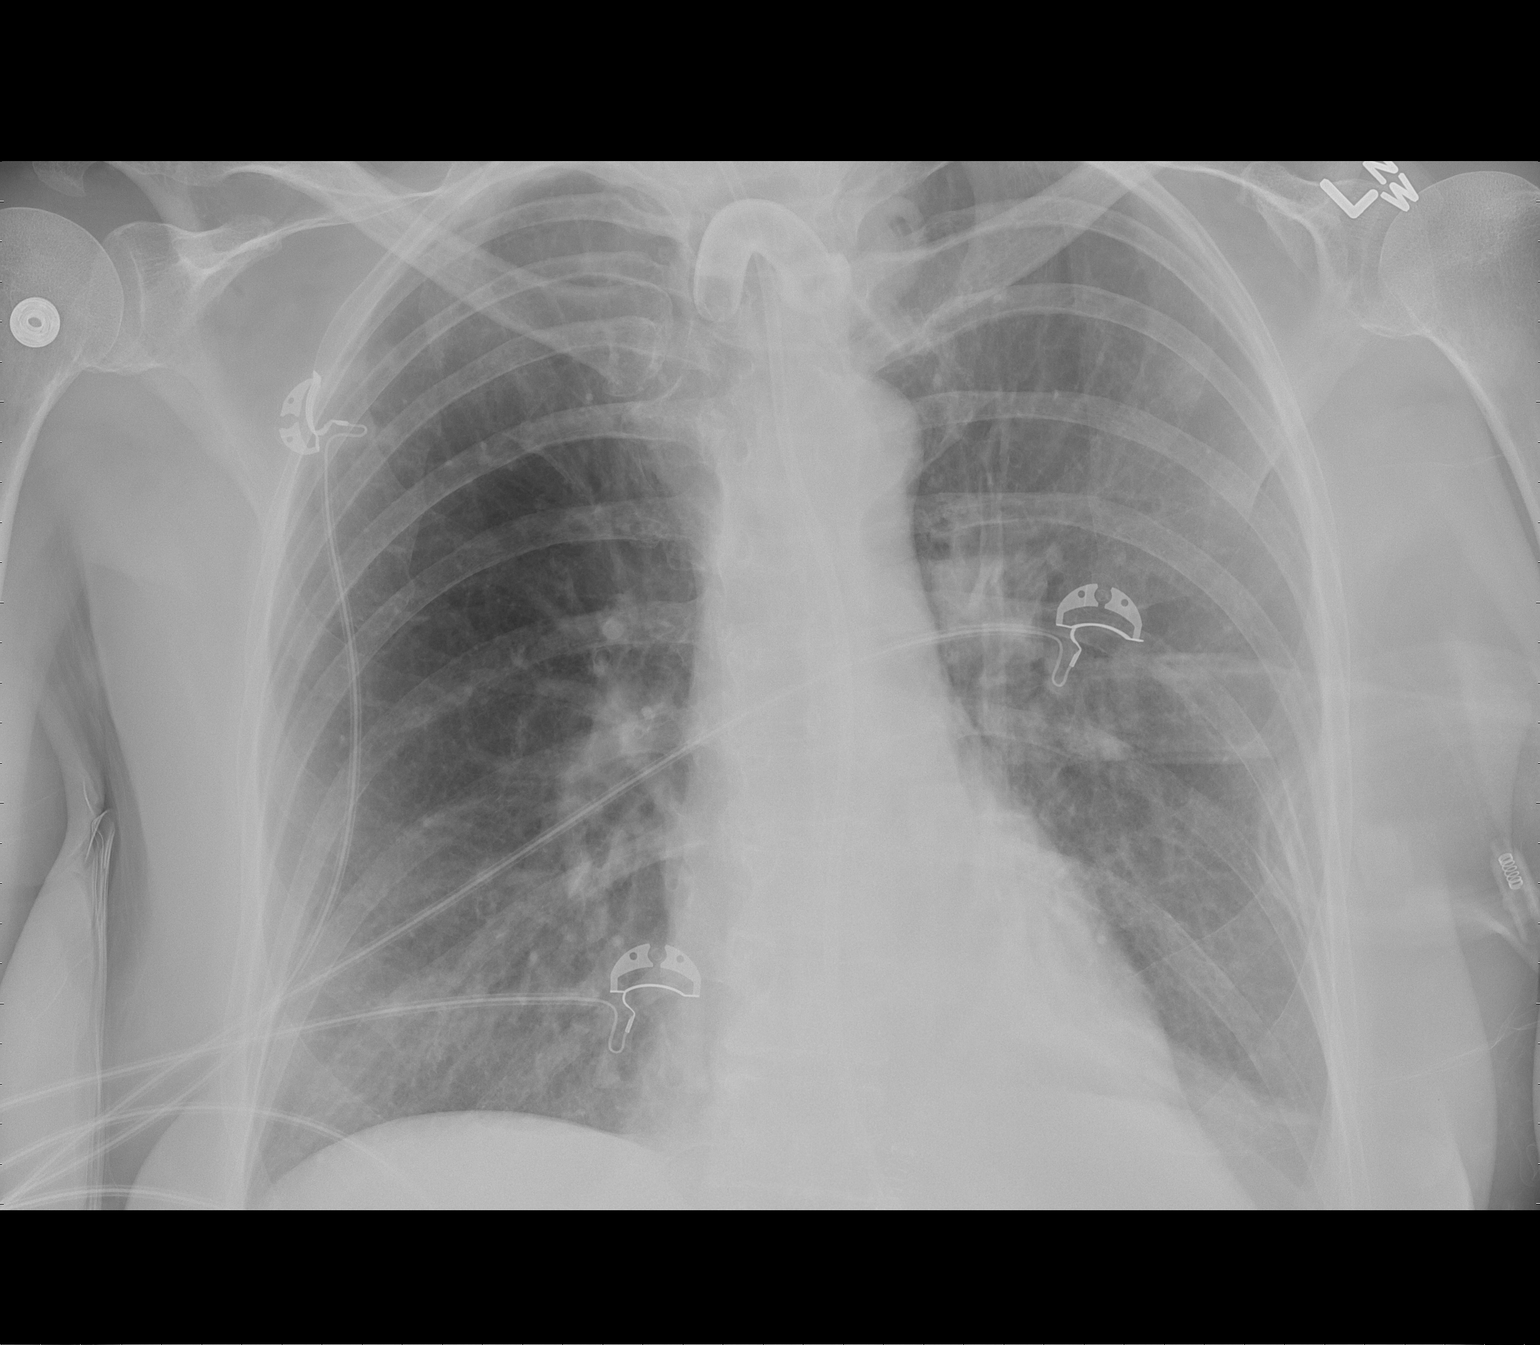

[1 of 1 positions shown; findings below may reference images not displayed]

FINDINGS: Tracheostomy tube is in place.  Feeding tube tip is below
the diaphragm.  Heart size and vascularity are normal.  There has
been partial clearing of the atelectasis at the left lung base.
Lungs are otherwise clear.  No acute osseous abnormality.
IMPRESSION: Improving left base atelectasis.

## 2013-05-19 ENCOUNTER — Ambulatory Visit: Payer: Self-pay | Admitting: Family Medicine

## 2014-09-24 NOTE — Consult Note (Signed)
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  Per son, pt has intermittent periods where she forgets everything and often they have to reorient her to the current surroundings.  This fluctuates since she had the infection at [facility] and she has also had some fluctuating personality and N/V since then as well.  Son denies witnessing any seizure activity, tongue biting or incontinence.  ROS:   General denies complaints    HEENT no complaints    Lungs no complaints    Cardiac no complaints    GI nausea/vomiting    GU no complaints    Musculoskeletal no complaints    Extremities no complaints    Skin no complaints    Neuro being forget ful    Endocrine no complaints    Psych anxiety   Past Medical/Surgical Hx:  Encephalitis:   cardiac dysrythmias:   anemia:   dysphagia:   Past Medical/ Surgical Hx:   Past Medical History as above    Past Surgical History gallbladder   Home Medications: Medication Instructions Last Modified Date/Time  zofran-not currently taking-"causes nausea"  28-Mar-13 23:24  aspirin 81 mg oral delayed release capsule 1 cap(s) orally once a day 28-Mar-13 23:24  Vitamin B1 100 mg oral tablet 1 tab(s) orally once a day 56-EPP-29 51:88  folic acid 1 mg oral tablet 1 tab(s) orally once a day 28-Mar-13 23:24  pro-stat 1  orally 2 times a day 28-Mar-13 23:24  levothyroxine 25 mcg (0.025 mg) oral tablet 1 tab(s) orally once a day 28-Mar-13 23:24  famotidine 20 mg oral tablet 1 tab(s) orally once a day 28-Mar-13 23:24   Allergies:  Premarin: Unknown  Provera: Unknown  Prednisone:  Unknown  Social/Family History:  Employment Status: disabled   Lives With: alone   Living Arrangements: group home   Social History: no tob, no EtOH, no illicits   Family History: no hx of seizures   Vital Signs: **Vital Signs.:   29-Mar-13 12:25   Vital Signs Type Routine   Temperature Temperature (F) 98.8   Celsius 37.1   Temperature Source oral   Pulse Pulse 90   Pulse source per Dinamap   Respirations Respirations 18   Systolic BP Systolic BP 416   Diastolic BP (mmHg) Diastolic BP (mmHg) 72   Mean BP 87   BP Source Dinamap   Pulse Ox % Pulse Ox % 93   Pulse Ox Activity Level  At rest   Oxygen Delivery Room Air/ 21 %   Physical Exam:  General: NAD, looks older than stated age, appropiate weight   HEENT: normocephalic, sclera nonicteric, oropharynx clear   Neck: supple, - Brudinski and Kernig, no JVD   Chest: CTA B, no wheezes   Cardiac: RRR, no murmurs   Extremities: no C/C/E   Neurologic Exam:  Mental Status: A+Ox2, not time but only misses day, follows, no dysarthria or aphasia, poor long term memory   Cranial Nerves: CN II-XII grossly intact   Motor Exam: 5/5 B no drift   Deep Tendon Reflexes: 2+ symmetric, down B   Sensory Exam: intact to light touch, temp and vibration  Coordination: FTN and HTS wnl   Lab Results: Thyroid:  28-Mar-13 22:42    Thyroid Stimulating Hormone   19.10  Hepatic:  28-Mar-13 22:42    Bilirubin, Total 0.5   Alkaline Phosphatase 79   SGPT (ALT) 20   SGOT (AST) 23   Total Protein, Serum 7.6   Albumin, Serum 3.9  Routine Chem:  28-Mar-13 22:42    Cholesterol, Serum   222   Triglycerides, Serum 127   HDL (INHOUSE) 55   VLDL Cholesterol Calculated 25   LDL Cholesterol Calculated   088   Folic Acid, Serum 11.0   Glucose, Serum   131   BUN 8   Creatinine (comp) 0.84   Sodium, Serum   135   Potassium, Serum 3.5   Chloride, Serum 102   CO2, Serum   19   Calcium (Total), Serum 9.1   Osmolality (calc) 270   eGFR (African  American) >60   eGFR (Non-African American) >60   Anion Gap 14  29-Mar-13 03:21    Ammonia, Plasma < 25  Urine Drugs:  31-RXY-[id number] 59:29    Tricyclic Antidepressant, Ur Qual (comp) NEGATIVE   Amphetamines, Urine Qual. NEGATIVE   MDMA, Urine Qual. NEGATIVE   Cocaine Metabolite, Urine Qual. NEGATIVE   Opiate, Urine qual NEGATIVE   Phencyclidine, Urine Qual. NEGATIVE   Cannabinoid, Urine Qual. NEGATIVE   Barbiturates, Urine Qual. NEGATIVE   Benzodiazepine, Urine Qual. NEGATIVE   Methadone, Urine Qual. NEGATIVE  Cardiac:  29-Mar-13 06:16    CK, Total 45   CPK-MB, Serum 1.8   Troponin I < 0.02  Routine UA:  29-Mar-13 00:37    Color (UA) Yellow   Clarity (UA) Clear   Glucose (UA) Negative   Bilirubin (UA) Negative   Ketones (UA) 1+   Specific Gravity (UA) 1.009   Blood (UA) Negative   pH (UA) 7.0   Protein (UA) Negative   Nitrite (UA) Negative   Leukocyte Esterase (UA) 1+   RBC (UA) 5 /HPF   WBC (UA) 8 /HPF   Bacteria (UA) TRACE   Epithelial Cells (UA) <1 /HPF   Mucous (UA) PRESENT  Routine Hem:  28-Mar-13 22:42    WBC (CBC) 8.6   RBC (CBC) 4.98   Hemoglobin (CBC) 13.9   Hematocrit (CBC) 41.2   Platelet Count (CBC) 184   MCV 83   MCH 27.9   MCHC 33.7   RDW 14.4   Radiology Results: [place]:    29-Mar-13 12:02, US Carotid Doppler Bilateral   US Carotid Doppler Bilateral    REASON FOR EXAM:    AMS  COMMENTS:       PROCEDURE: [place]  - US CAROTID DOPPLER BILATERAL  - Aug 29 2011 12:02PM     RESULT: Bilateral carotid color-flow duplex Doppler reveals no   significant plaque. Peak systolic flow velocity ratio right 0.6, left   0.72 . are patent with antegrade flow.    IMPRESSION:  No significant abnormalities.          Verified By: Osa Craver, M.D., MD  CT:    28-Mar-13 23:04, CT Head Without Contrast   CT Head Without Contrast    REASON FOR EXAM:    altered mental status  COMMENTS:   May transport without cardiac monitor    PROCEDURE: CT  - CT HEAD  WITHOUT CONTRAST  - Aug 28 2011 11:04PM     RESULT:     History: Altered mental status.    Comparison Study: Head  CT of 12/30/2010.    Procedure and Findings: New onset of lucency is noted throughout the   right temporal lobe. A right temporal lobe mass and/or infarct cannot be   excluded. Other etiologies such as infectious etiology cannot be   excluded. This could be a site ofold infarct. Clinical correlation     suggested. MRI can be obtained if needed. No hemorrhage. No other new   abnormalities are noted.    IMPRESSION:      1. Lucency noted throughout the right temporal lobe as described above.   There may be a component of volume loss associated with this. This may   just represent encephalomalacia from prior stroke. This finding, however,   is new from 12/30/2010. MRI may prove useful for further evaluation and to   exclude other etiologies.    Thank you for the opportunity to contribute to the care of your patient.           Verified By: Osa Craver, M.D., MD   Impression/Recommendations:  Recommendations:   1.  AMS-  concern for seizure due to the fluctuating history given by son.  I highly doubt that this is a recurrence of her HSV encephalitis however this could be a focus for seizure.  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  Look for other causes of encephalopathy but highly doubt and this is sounding more like a chronic problem since having the initial infection.  Small UTI can bring out seizures as well. R temporal lobe hypodensity on CT-  this is probably encephalomalacia from the previous HSV encephalitis.  I highly doubt a new infection.  agree with MRI of brain w/wo contrast start Trileptal 357m BID PO x 3 days and increase to 6039mBID PO family to keep a seizure log no driving or operating heavy machinery x 6 months ok to d/c from Neuro standpoint this weekend but suspect placement  issues;  will sign out for [place] follow on Monday  Electronic Signatures: SmJamison NeighborMD)  (Signed 29-Mar-13 14:55)  Authored: REFERRING PHYSICIAN, Consult, History of Present Illness, Review of Systems, PAST MEDICAL/SURGICAL HISTORY, HOME MEDICATIONS, ALLERGIES, Social/Family History, NURSING VITAL SIGNS, Physical Exam-, LAB RESULTS, RADIOLOGY RESULTS, Recommendations   Last Updated: 29-Mar-13 14:55 by SmJamison NeighborMD)

## 2014-09-24 NOTE — H&P (Signed)
PATIENT NAME:  [name], Lauren MR#:  [id number] DATE OF BIRTH:  1945/10/27  DATE OF ADMISSION:  08/29/2011  REFERRING PHYSICIAN: Dr. Manson Passey PRIMARY CARE PHYSICIAN: Dr. Candelaria Stagers   PRESENTING COMPLAINT: Altered mental status.   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 No family members available in the room and patient is a poor historian. She does not have recollection of what brought her in. Patient is alert, oriented to person, place and year but not month or situation. She denies any chest pain, shortness of breath. No palpitations. Reports intermittent nausea and difficulty swallowing. In reviewing her records from [facility] there apparently reports of cognitive status returning to normal. Patient was last seen in clinic on 08/04/2011.   PAST MEDICAL HISTORY:  1. From 12/31/2010 to 02/05/2011 patient hospitalized at [facility] for herpes simplex virus encephalitis with complicated medical course involving bilateral pneumonia thought to be aspiration pneumonitis with resultant tracheostomy and PEG tube placement. She also had noted hyponatremia and hypoglycemia and anemia during that hospitalization.  2. Recent diagnosis of hypothyroidism.  3. Hyperlipidemia.  4. Tinnitus.   5. Severe mitral regurgitation.   PAST SURGICAL HISTORY:  1. Tracheostomy.  2. PEG tube.  3. Cholecystectomy.  4. Bilateral tubal ligation.  5. Bunion repair.   ALLERGIES: Premarin and Provera and prednisone.   MEDICATIONS:  1. Aspirin 81 mg daily.  2. Vitamin B 100 mg daily.  3. Folic acid 1 mg daily.  4. Pro-Stat 64, 30 mL b.i.d.  5. Levothyroxine 25 mcg daily.  6. Famotidine 20 mg daily.  7. Vita  B6 vitamin daily.  8. Tylenol 325 mg every four hours as needed.  9. Bisacodyl 10 mg suppository daily as needed.  10. Ondansetron ODT 4 mg every four hours as needed.   FAMILY HISTORY: Father prostate cancer. Mother with questionable dementia on previous records.   SOCIAL HISTORY: Patient reports she quit tobacco unsure what period. No alcohol or drug use. She is currently residing at assisted living facility.   REVIEW OF SYSTEMS: Unreliable, however. CONSTITUTIONAL: She endorses some intermittent nausea. EYES: No glaucoma. ENT: She reports that her dysphagia is improving. RESPIRATORY: No cough or wheezing. CARDIOVASCULAR: Denies chest pain, palpitations or syncope. GASTROINTESTINAL: Endorses intermittent nausea. No abdominal pain, hematemesis. GENITOURINARY: No dysuria, hematuria. ENDO: No polyuria, polydipsia. HEMATOLOGIC: No easy bleeding. SKIN: No ulcers. MUSCULOSKELETAL: No neck pain or joint swelling. NEUROLOGIC: No one-sided weakness or numbness. PSYCH: Denies any suicidal ideation.   PHYSICAL EXAMINATION:  VITAL SIGNS: Temperature 97.7, pulse 99, respiratory rate 18, blood pressure 155/84, saturating at 98% on room air.   GENERAL: Lying in bed in no apparent distress.   HEENT: Normocephalic, atraumatic. Pupils equal, symmetric, nonicteric. Nares without discharge. Oropharynx without erythema. Moist mucous membrane.   NECK: Soft and supple. No adenopathy or JVP.   CARDIOVASCULAR: Tachycardic with 3+ systolic murmur. No rubs or gallops.   LUNGS: Clear to auscultation bilaterally. No use of accessory muscles or increased respiratory effort.   ABDOMEN: Soft. Positive bowel sounds. No mass appreciated.   EXTREMITIES: No edema. Dorsal pedis pulses intact.   MUSCULOSKELETAL: No joint effusion.   SKIN: No ulcers.   NEUROLOGIC: No dysarthria or aphasia. Symmetrical strength. Sensation intact. No pronator drift. No asterixis.  Nose to finger intact.   PSYCH: She is alert but not  oriented to month or situation.   LABORATORY, DIAGNOSTIC AND RADIOLOGICAL DATA: Urinalysis with specific gravity 1.009, pH 7, 1+ leukocyte esterase, 5 per high-power field RBC, 8 per high-power field WBC. Urine drug screen negative. WBC 8.6, hemoglobin 13.9, hematocrit 41.2, platelets 184, MCV 83, glucose 131, BUN 8, creatinine 0.84, sodium 135, potassium 3.5, chloride 103, carbon dioxide 19, calcium 9.1. Troponin less than 0.02. CT scan of the head with loss of gray-white matter distinction and diffuse decreased attenuation within the right temporal lobe which suggests a potential subacute or chronic infarct. Please correlate with past clinical history and if needed further assessment with MRI. There is mild ventricular enlargement, more pronounced within the right lateral ventricle, probably due to volume loss of the right temporal lobe and inferior frontal lobe. EKG with tachycardia and no ST elevation or depression.   ASSESSMENT AND PLAN: [name] is a 69 year old woman with history of HSV encephalitis, hypothyroidism, hyperlipidemia presenting with altered mental status.  1. Altered mental status. CT head scan with questionable subacute versus chronic infarct within the right temporal lobe. This could be possibly sequela of HSV encephalitis back in July 2012 but given her acute symptoms concerning for more so acute versus subacute cerebrovascular accident. Her urine shows only mild pyuria. This could be a contributing factor. Will send urine culture. Will also stop her aspirin and start her on Aggrenox. Send MRI, carotid Doppler's, echocardiogram. Continue neuro checks. Will get PT evaluation. Will send ammonia, B12, folate, RPR, and TSH to further evaluate for her altered mental status.  2. Hyperlipidemia. Send fasting lipid panel. Currently not on medication.  3. Hypothyroidism. Send TSH and restart her Synthroid.  4. Prophylaxis with Aggrenox and Pepcid.   TIME SPENT: Approximately 45 minutes  spent on patient care.    ____________________________ Reuel DerbyAlounthith Glynis Hunsucker, MD ap:cms D: 08/29/2011 03:20:28 ET T: 08/29/2011 10:47:45 ET JOB#: [id number]  cc: Pearlean BrownieAlounthith Merlin Ege, MD, <Dictator> Don C. Candelaria Stagershaplin, MD Reuel DerbyALOUNTHITH Anabeth Chilcott MD ELECTRONICALLY SIGNED 08/30/2011 3:51

## 2014-09-24 NOTE — Discharge Summary (Signed)
PATIENT NAME:  [name], [name] MR#:  [id number] DATE OF BIRTH:  1946/01/03  DATE OF ADMISSION:  08/29/2011 DATE OF DISCHARGE:  09/01/2011  HISTORY OF PRESENT ILLNESS: Ms. Frein is a 69 year old female who had a significant viral encephalopathy in July, hospitalized for a long period of time at [facility], eventually went to a rehab center. She was recently removed from the rehab center to an assisted living a month or so ago. She had some change in her mental status and was brought to the Emergency Room for further assessment. There was some question of whether she had an acute stroke or exacerbation of her encephalopathy because of her altered mental status. Her CAT scan at the time in the Emergency Room showed some subacute versus chronic infarct within the right temporal lobe. There was some evidence of pyuria in her urine and she was admitted for further evaluation and stabilization. She was also found to be with her TSH elevated. At the time of admission she was prophylaxis with Pepcid and Aggrenox. She was seen in consultation by the neurologist on call who raised the possibility that she was having some form of seizure disorder even though no grand mal abnormality was appreciated with multiple muscle movements. It was felt that there was no evidence suggestive of an HSV encephalopathy recurrence and the possibility of a temporal lobe epilepsy was entertained and was started on Trileptal. She developed some evidence of hyponatremia. The neurologist felt that the medication may be contributing to that and the antiseizure medicine was changed to Vimpat 50 mg b.i.d. with a goal to get it to 100 mg b.i.d. Her sodium stabilized and the neurologist felt she should be maintained on this medication for some time with further outpatient evaluation. Her urinary tract infection was continued to be treated with IV Rocephin which the germ was sensitive to, eventually switched to Septra which was also a drug the  organism was sensitive to. The neurologist recommended continuing the Vimpat 100 mg b.i.d. and follow-up. There was a B12 level still pending.   Chest x-ray showed no acute abnormality. MRI of the brain without contrast showed abnormal appearance of the right temporal lobe consistent with old infarct. There was no evidence of an abscess. Ultrasounds of the carotids showed no significant hemodynamic blockage.   LABORATORY STUDIES AT TIME OF DISCHARGE: Sugar 88, BUN 7, creatinine 0.8, sodium 139, potassium 3.7, chloride 106. Thyroid stimulating hormone was in normal range at 3.38. White count 5200. Hemoglobin 13.   She did have an echo Doppler done in the hospital that showed left ventricular function to be normal with EF of 55%, left atrium mildly dilated. There was severe mitral regurgitation, moderate tricuspid regurgitation.   Blood pressure ranged from 102/63 to 120/63. She was afebrile. Oximetry was 100% on room air. The patient tolerated eating well. She ambulated without difficulty and was returned back to her assisted living.   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   MEDICATIONS AT DISCHARGE: 1. Aspirin 81 mg. 2. Vitamin D 100 units daily.  3. Folic acid 1 mg daily.  4. Pro-Stat 1 tablet twice a day. 5. Pepcid 20 mg daily.  6. Tylenol 325 every four hours p.r.n. as needed. 7. Dulcolax (Bisacodyl) 10 mg rectal suppository as needed.  8. Zofran 4 mg tablet every four hours p.r.n. for nausea and vomiting.  9. Tylenol could also  be used 325 two tablets every four hours for moderate pain.   NEW MEDICATIONS ADDED: 1. Vimpat 100 mg b.i.d. for seizure control. 2. Levothyroxine 50 mcg daily for hypothyroid.   PROGNOSIS: Overall prognosis is guarded.   FOLLOW-UP: The patient will be seen in follow-up in the  office in approximately a week to 10 days.   ____________________________ Jimmie Mollyon C. Candelaria Stagershaplin, MD dcc:drc D: 09/02/2011 07:33:24 ET T: 09/02/2011 14:19:40 ET JOB#: [id number]  cc: Traye Bates C. Candelaria Stagershaplin, MD, <Dictator> Virl AxeN C Lorilee Cafarella MD ELECTRONICALLY SIGNED 09/05/2011 6:14

## 2015-08-15 ENCOUNTER — Other Ambulatory Visit: Payer: Self-pay | Admitting: Family Medicine

## 2015-08-15 DIAGNOSIS — Z1231 Encounter for screening mammogram for malignant neoplasm of breast: Secondary | ICD-10-CM

## 2015-08-21 ENCOUNTER — Other Ambulatory Visit: Payer: Self-pay | Admitting: Family Medicine

## 2015-08-21 ENCOUNTER — Ambulatory Visit
Admission: RE | Admit: 2015-08-21 | Discharge: 2015-08-21 | Disposition: A | Discharge status: Home / Self Care | Payer: Medicare Other | Source: Ambulatory Visit | Attending: Family Medicine | Admitting: Family Medicine

## 2015-08-21 DIAGNOSIS — Z1231 Encounter for screening mammogram for malignant neoplasm of breast: Secondary | ICD-10-CM

## 2015-12-24 ENCOUNTER — Other Ambulatory Visit: Payer: Self-pay | Admitting: Family Medicine

## 2015-12-24 DIAGNOSIS — Z0000 Encounter for general adult medical examination without abnormal findings: Secondary | ICD-10-CM

## 2016-01-15 ENCOUNTER — Ambulatory Visit
Admission: RE | Admit: 2016-01-15 | Discharge: 2016-01-15 | Disposition: A | Discharge status: Home / Self Care | Payer: Medicare Other | Source: Ambulatory Visit | Attending: Family Medicine | Admitting: Family Medicine

## 2016-01-15 DIAGNOSIS — E039 Hypothyroidism, unspecified: Secondary | ICD-10-CM | POA: Insufficient documentation

## 2016-01-15 DIAGNOSIS — M85852 Other specified disorders of bone density and structure, left thigh: Secondary | ICD-10-CM | POA: Diagnosis not present

## 2016-01-15 DIAGNOSIS — Z0000 Encounter for general adult medical examination without abnormal findings: Secondary | ICD-10-CM

## 2016-01-15 DIAGNOSIS — Z780 Asymptomatic menopausal state: Secondary | ICD-10-CM | POA: Diagnosis not present

## 2016-01-15 DIAGNOSIS — M8588 Other specified disorders of bone density and structure, other site: Secondary | ICD-10-CM | POA: Diagnosis not present

## 2016-07-09 ENCOUNTER — Other Ambulatory Visit: Payer: Self-pay | Admitting: Family Medicine

## 2016-07-09 DIAGNOSIS — Z1231 Encounter for screening mammogram for malignant neoplasm of breast: Secondary | ICD-10-CM

## 2016-08-22 ENCOUNTER — Ambulatory Visit
Admission: RE | Admit: 2016-08-22 | Discharge: 2016-08-22 | Disposition: A | Discharge status: Home / Self Care | Payer: Medicare Other | Source: Ambulatory Visit | Attending: Family Medicine | Admitting: Family Medicine

## 2016-08-22 DIAGNOSIS — Z1231 Encounter for screening mammogram for malignant neoplasm of breast: Secondary | ICD-10-CM

## 2016-09-08 ENCOUNTER — Emergency Department
Admission: EM | Admit: 2016-09-08 | Discharge: 2016-09-08 | Disposition: A | Discharge status: Home / Self Care | Payer: Medicare Other | Attending: Emergency Medicine | Admitting: Emergency Medicine

## 2016-09-08 ENCOUNTER — Encounter: Payer: Self-pay | Admitting: Emergency Medicine

## 2016-09-08 DIAGNOSIS — K625 Hemorrhage of anus and rectum: Secondary | ICD-10-CM | POA: Diagnosis present

## 2016-09-08 DIAGNOSIS — Z7982 Long term (current) use of aspirin: Secondary | ICD-10-CM | POA: Diagnosis not present

## 2016-09-08 DIAGNOSIS — K922 Gastrointestinal hemorrhage, unspecified: Secondary | ICD-10-CM | POA: Insufficient documentation

## 2016-09-08 DIAGNOSIS — D649 Anemia, unspecified: Secondary | ICD-10-CM | POA: Diagnosis not present

## 2016-09-08 LAB — COMPREHENSIVE METABOLIC PANEL
ALT: 12 U/L — ABNORMAL LOW (ref 14–54)
AST: 25 U/L (ref 15–41)
Albumin: 3.8 g/dL (ref 3.5–5.0)
Alkaline Phosphatase: 56 U/L (ref 38–126)
Anion gap: 7 (ref 5–15)
BUN: 18 mg/dL (ref 6–20)
CHLORIDE: 106 mmol/L (ref 101–111)
CO2: 23 mmol/L (ref 22–32)
Calcium: 8.8 mg/dL — ABNORMAL LOW (ref 8.9–10.3)
Creatinine, Ser: 0.93 mg/dL (ref 0.44–1.00)
GFR, EST NON AFRICAN AMERICAN: 60 mL/min — AB (ref >60)
Glucose, Bld: 98 mg/dL (ref 65–99)
POTASSIUM: 3.6 mmol/L (ref 3.5–5.1)
SODIUM: 136 mmol/L (ref 135–145)
Total Bilirubin: 0.4 mg/dL (ref 0.3–1.2)
Total Protein: 6.8 g/dL (ref 6.5–8.1)

## 2016-09-08 LAB — TYPE AND SCREEN
ABO/RH(D): O NEG
Antibody Screen: NEGATIVE

## 2016-09-08 LAB — CBC
HEMATOCRIT: 29.4 % — AB (ref 35.0–47.0)
Hemoglobin: 9.7 g/dL — ABNORMAL LOW (ref 12.0–16.0)
MCH: 26.8 pg (ref 26.0–34.0)
MCHC: 33.2 g/dL (ref 32.0–36.0)
MCV: 80.8 fL (ref 80.0–100.0)
Platelets: 197 10*3/uL (ref 150–440)
RBC: 3.64 MIL/uL — AB (ref 3.80–5.20)
RDW: 15.4 % — ABNORMAL HIGH (ref 11.5–14.5)
WBC: 7.4 10*3/uL (ref 3.6–11.0)

## 2016-09-08 MED ORDER — DILTIAZEM HCL 100 MG IV SOLR: 5.0000 mg/h | Freq: Once | INTRAVENOUS | Status: DC

## 2016-09-08 NOTE — ED Triage Notes (Signed)
Patient presents to the ED with occasional bright red bleeding with bowel movements x 5 days.  Patient reports bleeding began after she was having very hard stools but she has been taking stool softeners and now stools are soft but she is still having blood with them.  Patient denies dizziness or weakness.  Patient denies pain and is in no obvious distress at this time.  Patient was sent to the ED by PCP because her hemoglobin was 9.

## 2016-09-08 NOTE — ED Provider Notes (Signed)
[facility] Emergency Department Provider Note   ____________________________________________   I have reviewed the triage vital signs and the nursing notes.   HISTORY  Chief Complaint Constipation and Rectal Bleeding   History limited by: Not Limited   HPI [name] is a 71 y.o. female who presents to the emergency department today sent by primary care because of concerns for GI bleed and anemia. Patient states that the GI bleeding started a few days ago shortly after she had a hard stool that required straining. She noticed red blood in the commode after this. She subsequently had a couple episodes of rectal bleeding. Her last episode of rectal bleeding was yesterday. No bowel movements today. Says never been accompanied by any abdominal pain. The patient is not felt any shortness of breath or chest pain. She did feel little bit dizzy immediately after her bloody bowel movements. She denies any history of the same. States she has had a colonoscopy in the past which was normal. No associated nausea or vomiting.   Past Medical History:  Diagnosis Date  . Acute respiratory failure (HCC)    Secondary to aspiration pneumonia   . Altered mental status    Secondary to viral herpes simplex virus encephalitis  . Anemia   . Dysphasia   . Hyperglycemia   . Hyponatremia     Patient Active Problem List   Diagnosis Date Noted  . Acute respiratory failure (HCC) 03/27/2011    Past Surgical History:  Procedure Laterality Date  . CHOLECYSTECTOMY    . TUBAL LIGATION      Prior to Admission medications   Medication Sig Start Date End Date Taking? Authorizing Provider  acetaminophen (TYLENOL) 325 MG tablet Take 650 mg by mouth every 6 (six) hours as needed. Thru G tube    Historical Provider, MD  Alum & Mag Hydroxide-Simeth (MAGIC MOUTHWASH) SOLN Take by mouth.      Historical Provider, MD  aspirin 81 MG tablet Take 81 mg by mouth daily.      Historical  Provider, MD  bisacodyl (DULCOLAX) 10 MG suppository Place 10 mg rectally as needed.      Historical Provider, MD  Dextromethorphan-Guaifenesin (DIABETIC TUSSIN DM MAX ST PO) Take by mouth. 10ml enteral tub every 6 hours     Historical Provider, MD  famotidine (PEPCID) 20 MG tablet 20 mg daily. Per tube daily     Historical Provider, MD  Flavoring Agent (CHLORHEXIDINE) CONC Take 15 mLs by mouth. Rinse twice daily     Historical Provider, MD  folic acid (FOLVITE) 1 MG tablet 1 mg. Per tube once daily      Historical Provider, MD  metoCLOPramide (REGLAN) 5 MG/5ML solution Take by mouth 4 (four) times daily -  before meals and at bedtime.      Historical Provider, MD  Nutritional Supplements (OSMOLITE) LIQD Take by mouth. 40 mL per hour     Historical Provider, MD  Nutritional Supplements (PROSOURCE) LIQD Give 30 mLs by tube 2 (two) times daily.      Historical Provider, MD  nystatin (MYCOSTATIN) 100000 UNIT/ML suspension 5mL by mouth every 6 hours     Historical Provider, MD  ondansetron (ZOFRAN) 4 MG tablet Take 4 mg by mouth every 4 (four) hours as needed.      Historical Provider, MD  potassium chloride 20 MEQ/15ML (10%) solution Take 20 mEq by mouth daily. Thru g tube     Historical Provider, MD  potassium chloride SA (KLOR-CON M20)  20 MEQ tablet 20 mEq. Per tube daily     Historical Provider, MD  senna (SENOKOT) 8.6 MG tablet Take 1 tablet by mouth 2 (two) times daily as needed.      Historical Provider, MD  Skin Protectants, Misc. (EUCERIN) cream Apply topically as needed.      Historical Provider, MD  thiamine 100 MG tablet Give 100 mg by tube daily.      Historical Provider, MD    Allergies Predicort [prednisolone acetate]  Family History  Problem Relation Age of Onset  . Prostate cancer Father   . Breast cancer Maternal Aunt   . Breast cancer Maternal Aunt   . Cerebral palsy Son     Social History Social History  Substance Use Topics  . Smoking status: Never Smoker  .  Smokeless tobacco: Never Used  . Alcohol use No    Review of Systems  Constitutional: Negative for fever. Cardiovascular: Negative for chest pain. Respiratory: Negative for shortness of breath. Gastrointestinal: Positive for gi bleed. Negative for abdominal pain. Neurological: Negative for headaches, focal weakness or numbness.  10-point ROS otherwise negative.  ____________________________________________   PHYSICAL EXAM:  VITAL SIGNS: ED Triage Vitals  Enc Vitals Group     BP 09/08/16 1553 137/62     Pulse Rate 09/08/16 1553 89     Resp 09/08/16 1553 18     Temp 09/08/16 1553 98.4 F (36.9 C)     Temp Source 09/08/16 1553 Oral     SpO2 09/08/16 1553 98 %     Weight 09/08/16 1553 156 lb (70.8 kg)     Height 09/08/16 1553  (1.651 m)     Head Circumference --      Peak Flow --      Pain Score 09/08/16 1552 0   Constitutional: Alert and oriented. Well appearing and in no distress. Eyes: Conjunctivae are normal. Normal extraocular movements. ENT   Head: Normocephalic and atraumatic.   Nose: No congestion/rhinnorhea.   Mouth/Throat: Mucous membranes are moist.   Neck: No stridor. Hematological/Lymphatic/Immunilogical: No cervical lymphadenopathy. Cardiovascular: Normal rate, regular rhythm.  No murmurs, rubs, or gallops.  Respiratory: Normal respiratory effort without tachypnea nor retractions. Breath sounds are clear and equal bilaterally. No wheezes/rales/rhonchi. Gastrointestinal: Soft and non tender. No rebound. No guarding.  Genitourinary: Deferred Musculoskeletal: Normal range of motion in all extremities. No lower extremity edema. Neurologic:  Normal speech and language. No gross focal neurologic deficits are appreciated.  Skin:  Skin is warm, dry and intact. No rash noted. Psychiatric: Mood and affect are normal. Speech and behavior are normal. Patient exhibits appropriate insight and judgment.  ____________________________________________     LABS (pertinent positives/negatives)  Labs Reviewed  COMPREHENSIVE METABOLIC PANEL - Abnormal; Notable for the following:       Result Value   Calcium 8.8 (*)    ALT 12 (*)    GFR calc non Af Amer 60 (*)    All other components within normal limits  CBC - Abnormal; Notable for the following:    RBC 3.64 (*)    Hemoglobin 9.7 (*)    HCT 29.4 (*)    RDW 15.4 (*)    All other components within normal limits  POC OCCULT BLOOD, ED  TYPE AND SCREEN     ____________________________________________   EKG  None  ____________________________________________    RADIOLOGY  None  ____________________________________________   PROCEDURES  Procedures  ____________________________________________   INITIAL IMPRESSION / ASSESSMENT AND PLAN / ED COURSE  Pertinent labs & imaging results that were available during my care of the patient were reviewed by me and considered in my medical decision making (see chart for details).  Patient presented to the emergency department today because of concerns for lower intestinal bleeding. Patient's last bloody bowel movement was yesterday at noon. Patient is slightly anemic. However given that the patient has not had any bleeding for over 24 hours a do not feel she necessarily requires admission. Patient certainly would rather go home. I discussed this with her son [name]. Also discussed possibility of obtaining CT scan however think colonoscopy would be more enlightening. Patient and son are both comfortable with deferring CT scan at this time. Will give patient GI follow-up for colonoscopy.  ____________________________________________   FINAL CLINICAL IMPRESSION(S) / ED DIAGNOSES  Final diagnoses:  Lower GI bleed  Anemia, unspecified type     Note: This dictation was prepared with Dragon dictation. Any transcriptional errors that result from this process are unintentional     Phineas Semen, MD 09/08/16 2043

## 2016-09-08 NOTE — Discharge Instructions (Signed)
Please seek medical attention for any high fevers, chest pain, shortness of breath, change in behavior, persistent vomiting, bloody stool or any other new or concerning symptoms.  

## 2016-09-08 NOTE — ED Notes (Signed)

## 2016-09-08 NOTE — ED Notes (Signed)
Pt reports bright red blood after having a bowel movement on Thursday. Pt states she was bearing down and noticed blood in toilet afterwards. Pt states since that time she has had "occasional bleeding, sometimes it's there and sometimes it's not" after BM. Pt denies abd tenderness. Pt is A&O. Pt denies SHOB.

## 2016-12-31 ENCOUNTER — Encounter
Admission: RE | Disposition: A | Discharge status: Home / Self Care | Payer: Self-pay | Source: Ambulatory Visit | Attending: Unknown Physician Specialty

## 2016-12-31 ENCOUNTER — Ambulatory Visit: Payer: Medicare Other | Admitting: Anesthesiology

## 2016-12-31 ENCOUNTER — Ambulatory Visit
Admission: RE | Admit: 2016-12-31 | Discharge: 2016-12-31 | Disposition: A | Discharge status: Home / Self Care | Payer: Medicare Other | Source: Ambulatory Visit | Attending: Unknown Physician Specialty | Admitting: Unknown Physician Specialty

## 2016-12-31 ENCOUNTER — Encounter: Payer: Self-pay | Admitting: Anesthesiology

## 2016-12-31 DIAGNOSIS — Z7982 Long term (current) use of aspirin: Secondary | ICD-10-CM | POA: Diagnosis not present

## 2016-12-31 DIAGNOSIS — K640 First degree hemorrhoids: Secondary | ICD-10-CM | POA: Insufficient documentation

## 2016-12-31 DIAGNOSIS — D509 Iron deficiency anemia, unspecified: Secondary | ICD-10-CM | POA: Insufficient documentation

## 2016-12-31 DIAGNOSIS — Z888 Allergy status to other drugs, medicaments and biological substances status: Secondary | ICD-10-CM | POA: Insufficient documentation

## 2016-12-31 DIAGNOSIS — K449 Diaphragmatic hernia without obstruction or gangrene: Secondary | ICD-10-CM | POA: Insufficient documentation

## 2016-12-31 DIAGNOSIS — Q438 Other specified congenital malformations of intestine: Secondary | ICD-10-CM | POA: Diagnosis not present

## 2016-12-31 DIAGNOSIS — K317 Polyp of stomach and duodenum: Secondary | ICD-10-CM | POA: Insufficient documentation

## 2016-12-31 DIAGNOSIS — R12 Heartburn: Secondary | ICD-10-CM | POA: Insufficient documentation

## 2016-12-31 DIAGNOSIS — I10 Essential (primary) hypertension: Secondary | ICD-10-CM | POA: Insufficient documentation

## 2016-12-31 DIAGNOSIS — Z79899 Other long term (current) drug therapy: Secondary | ICD-10-CM | POA: Insufficient documentation

## 2016-12-31 DIAGNOSIS — Z8673 Personal history of transient ischemic attack (TIA), and cerebral infarction without residual deficits: Secondary | ICD-10-CM | POA: Insufficient documentation

## 2016-12-31 DIAGNOSIS — K5730 Diverticulosis of large intestine without perforation or abscess without bleeding: Secondary | ICD-10-CM | POA: Diagnosis not present

## 2016-12-31 HISTORY — DX: Encephalitis and encephalomyelitis, unspecified: G04.90

## 2016-12-31 HISTORY — DX: Essential (primary) hypertension: I10

## 2016-12-31 HISTORY — PX: COLONOSCOPY WITH PROPOFOL: SHX5780

## 2016-12-31 HISTORY — DX: Unspecified convulsions: R56.9

## 2016-12-31 HISTORY — DX: Cerebral infarction, unspecified: I63.9

## 2016-12-31 HISTORY — DX: Pneumonia, unspecified organism: J18.9

## 2016-12-31 HISTORY — PX: ESOPHAGOGASTRODUODENOSCOPY (EGD) WITH PROPOFOL: SHX5813

## 2016-12-31 SURGERY — COLONOSCOPY WITH PROPOFOL
Anesthesia: General

## 2016-12-31 MED ORDER — FENTANYL CITRATE (PF) 100 MCG/2ML IJ SOLN
INTRAMUSCULAR | Status: DC | PRN
  Administered 2016-12-31: 50 ug via INTRAVENOUS

## 2016-12-31 MED ORDER — MIDAZOLAM HCL 5 MG/5ML IJ SOLN
INTRAMUSCULAR | Status: DC | PRN
  Administered 2016-12-31: 1 mg via INTRAVENOUS

## 2016-12-31 MED ORDER — SODIUM CHLORIDE 0.9 % IV SOLN
INTRAVENOUS | Status: DC
  Administered 2016-12-31 (×2): via INTRAVENOUS

## 2016-12-31 MED ORDER — LIDOCAINE HCL (PF) 2 % IJ SOLN
INTRAMUSCULAR | Status: DC | PRN
  Administered 2016-12-31: 50 mg

## 2016-12-31 MED ORDER — MIDAZOLAM HCL 2 MG/2ML IJ SOLN
INTRAMUSCULAR | Status: AC
  Filled 2016-12-31: qty 2

## 2016-12-31 MED ORDER — PROPOFOL 10 MG/ML IV BOLUS
INTRAVENOUS | Status: DC | PRN
  Administered 2016-12-31 (×2): 10 mg via INTRAVENOUS
  Administered 2016-12-31: 30 mg via INTRAVENOUS

## 2016-12-31 MED ORDER — FENTANYL CITRATE (PF) 100 MCG/2ML IJ SOLN
INTRAMUSCULAR | Status: AC
  Filled 2016-12-31: qty 2

## 2016-12-31 MED ORDER — GLYCOPYRROLATE 0.2 MG/ML IJ SOLN
INTRAMUSCULAR | Status: DC | PRN
  Administered 2016-12-31: 0.2 mg via INTRAVENOUS

## 2016-12-31 MED ORDER — SODIUM CHLORIDE 0.9 % IV SOLN: INTRAVENOUS | Status: DC

## 2016-12-31 MED ORDER — PROPOFOL 500 MG/50ML IV EMUL
INTRAVENOUS | Status: DC | PRN
  Administered 2016-12-31: 75 ug/kg/min via INTRAVENOUS

## 2016-12-31 NOTE — Transfer of Care (Signed)
Immediate Anesthesia Transfer of Care Note  Patient: Orlin HildingMaxine B Wierzbicki  Procedure(s) Performed: Procedure(s): COLONOSCOPY WITH PROPOFOL (N/A) ESOPHAGOGASTRODUODENOSCOPY (EGD) WITH PROPOFOL (N/A)  Patient Location: PACU  Anesthesia Type:General  Level of Consciousness: sedated  Airway & Oxygen Therapy: Patient Spontanous Breathing and Patient connected to nasal cannula oxygen  Post-op Assessment: Report given to RN and Post -op Vital signs reviewed and stable  Post vital signs: Reviewed and stable  Last Vitals:  Vitals:   12/31/16 0914 12/31/16 1040  BP: 121/72 96/61  Pulse: 80 86  Resp: 16 19  Temp: (!) (P) 36.2 C (!) 36.2 C    Last Pain:  Vitals:   12/31/16 1040  TempSrc: Tympanic         Complications: No apparent anesthesia complications

## 2016-12-31 NOTE — Anesthesia Preprocedure Evaluation (Addendum)
Anesthesia Evaluation  Patient identified by MRN, date of birth, ID band Patient awake    Reviewed: Allergy & Precautions, NPO status , Patient's Chart, lab work & pertinent test results, reviewed documented beta blocker date and time   Airway Mallampati: III  TM Distance: >3 FB     Dental  (+) Chipped, Dental Advisory Given, Poor Dentition, Missing   Pulmonary pneumonia, resolved,           Cardiovascular hypertension, Pt. on medications      Neuro/Psych Seizures -,  CVA, No Residual Symptoms    GI/Hepatic   Endo/Other    Renal/GU      Musculoskeletal   Abdominal   Peds  Hematology  (+) anemia ,   Anesthesia Other Findings Respiratory failure from bilateral pneumo 5 yrs ago. Hb 9.7.  Reproductive/Obstetrics                            Anesthesia Physical Anesthesia Plan  ASA: III  Anesthesia Plan: General   Post-op Pain Management:    Induction: Intravenous  PONV Risk Score and Plan:   Airway Management Planned:   Additional Equipment:   Intra-op Plan:   Post-operative Plan:   Informed Consent: I have reviewed the patients History and Physical, chart, labs and discussed the procedure including the risks, benefits and alternatives for the proposed anesthesia with the patient or authorized representative who has indicated his/her understanding and acceptance.     Plan Discussed with: CRNA  Anesthesia Plan Comments:         Anesthesia Quick Evaluation

## 2016-12-31 NOTE — Anesthesia Postprocedure Evaluation (Signed)
Anesthesia Post Note  Patient: Jeimy B Thien  Procedure(s) Performed: Procedure(s) (LRB): COLONOSCOPY WITH PROPOFOL (N/A) ESOPHAGOGASTRODUODENOSCOPY (EGD) WITH PROPOFOL (N/A)  Patient location during evaluation: Endoscopy Anesthesia Type: General Level of consciousness: awake and alert Pain management: pain level controlled Vital Signs Assessment: post-procedure vital signs reviewed and stable Respiratory status: spontaneous breathing, nonlabored ventilation, respiratory function stable and patient connected to nasal cannula oxygen Cardiovascular status: blood pressure returned to baseline and stable Postop Assessment: no signs of nausea or vomiting Anesthetic complications: no     Last Vitals:  Vitals:   12/31/16 1113 12/31/16 1120  BP: 97/60 120/69  Pulse: 77 81  Resp: (!) 28 18  Temp:      Last Pain:  Vitals:   12/31/16 1040  TempSrc: Tympanic                 Vania Rosero S

## 2016-12-31 NOTE — Op Note (Signed)
[facility] Gastroenterology Patient Name: Lauren DameMaxine Hood Procedure Date: 12/31/2016 9:45 AM MRN: [id number] Account #: [id number] Date of Birth: 09/28/1945 Admit Type: Outpatient Age: [id number] Room: [facility] END[facility] ROOM 3 Gender: Female Note Status: Finalized Procedure:            Upper GI endoscopy Indications:          Heartburn, Suspected gastro-esophageal reflux disease Providers:            Scot Junobert T. Elliott, MD Referring MD:         Ezekiel Slocumbom Wroth, MD (Referring MD) Medicines:            Propofol per Anesthesia Complications:        No immediate complications. Procedure:            Pre-Anesthesia Assessment:                       - After reviewing the risks and benefits, the patient                        was deemed in satisfactory condition to undergo the                        procedure.                       After obtaining informed consent, the endoscope was                        passed under direct vision. Throughout the procedure,                        the patient's blood pressure, pulse, and oxygen                        saturations were monitored continuously. The Endoscope                        was introduced through the mouth, and advanced to the                        second part of duodenum. The upper GI endoscopy was                        accomplished without difficulty. The patient tolerated                        the procedure well. Findings:      The examined esophagus was normal. GEJ at 42cm.      A small hiatal hernia was present. 2-3cm.      Multiple medium pedunculated and sessile polyps with no bleeding and no       stigmata of recent bleeding were found in the gastric body.      The examined duodenum was normal. Impression:           - Normal esophagus.                       - Small hiatal hernia.                       - Multiple gastric polyps.                       -  Normal examined duodenum.                       - No specimens  collected. Recommendation:       - Perform a colonoscopy as previously scheduled. Scot Junobert T Elliott, MD 12/31/2016 10:18:49 AM This report has been signed electronically. Number of Addenda: 0 Note Initiated On: 12/31/2016 9:45 AM      [facility]

## 2016-12-31 NOTE — Op Note (Signed)
[facility] Gastroenterology Patient Name: Lauren DameMaxine Karen Procedure Date: 12/31/2016 9:44 AM MRN: [id number] Account #: [id number] Date of Birth: 07/04/1945 Admit Type: Outpatient Age: [age] [facility] Gender: Female Note Status: Finalized Procedure:            Colonoscopy Indications:          Unexplained iron deficiency anemia Providers:            Scot Junobert T. Devine Dant, MD Medicines:            Propofol per Anesthesia Complications:        No immediate complications. Procedure:            Pre-Anesthesia Assessment:                       - After reviewing the risks and benefits, the patient                        was deemed in satisfactory condition to undergo the                        procedure.                       After obtaining informed consent, the colonoscope was                        passed under direct vision. Throughout the procedure,                        the patient's blood pressure, pulse, and oxygen                        saturations were monitored continuously. The                        Colonoscope was introduced through the anus and                        advanced to the the cecum, identified by appendiceal                        orifice and ileocecal valve. The colonoscopy was                        somewhat difficult due to restricted mobility of the                        colon and a tortuous colon. Successful completion of                        the procedure was aided by applying abdominal pressure.                        The patient tolerated the procedure well. The quality                        of the bowel preparation was adequate to identify                        polyps. Findings:  Multiple small and large-mouthed diverticula were found in the sigmoid       colon and descending colon.      Internal hemorrhoids were found during endoscopy. The hemorrhoids were       small, medium-sized and Grade I (internal hemorrhoids that  do not       prolapse).      The exam was otherwise without abnormality. Impression:           - Diverticulosis in the sigmoid colon and in the                        descending colon.                       - Internal hemorrhoids.                       - The examination was otherwise normal.                       - No specimens collected. Recommendation:       - The findings and recommendations were discussed with                        the patient's family. Do hemocults and if positive do                        capsule endoscopy. Scot Junobert T Copper Kirtley, MD 12/31/2016 10:47:40 AM This report has been signed electronically. Number of Addenda: 0 Note Initiated On: 12/31/2016 9:44 AM Scope Withdrawal Time: 0 hours 13 minutes 36 seconds  Total Procedure Duration: 0 hours 24 minutes 9 seconds       [facility]

## 2016-12-31 NOTE — Anesthesia Post-op Follow-up Note (Cosign Needed)
Anesthesia QCDR form completed.        

## 2016-12-31 NOTE — H&P (Signed)
Primary Care Physician:  Wroth, Thomas H, MD Primary Gastroenterologist:  Dr. MecMickel Fuchshele CollinElliott  Pre-Procedure History & Physical: HPI:  [name] is a 71 y.o. female is here for an endoscopy and colonoscopy.   Past Medical History:  Diagnosis Date  . Acute respiratory failure (HCC)    Secondary to aspiration pneumonia   . Altered mental status    Secondary to viral herpes simplex virus encephalitis  . Anemia   . Bilateral pneumonia   . Dysphasia   . Encephalitis   . Hyperglycemia   . Hypertension   . Hyponatremia   . Seizures (HCC)   . Stroke [facility](HCC)     Past Surgical History:  Procedure Laterality Date  . CHOLECYSTECTOMY    . TONSILLECTOMY    . TUBAL LIGATION      Prior to Admission medications   Medication Sig Start Date End Date Taking? Authorizing Provider  aspirin 81 MG tablet Take 81 mg by mouth daily.     Yes [provider]  calcium-vitamin D (OSCAL WITH D) 500-200 MG-UNIT tablet Take 1 tablet by mouth 2 (two) times daily.   Yes [provider]  docusate sodium (COLACE) 100 MG capsule Take 100 mg by mouth 2 (two) times daily.   Yes [provider]  folic acid (FOLVITE) 1 MG tablet Take 1 mg by mouth daily. Per tube once daily     Yes [provider]  guaiFENesin (MUCINEX) 600 MG 12 hr tablet Take 600 mg by mouth 2 (two) times daily.   Yes [provider]  levothyroxine (SYNTHROID, LEVOTHROID) 50 MCG tablet Take 50 mcg by mouth daily before breakfast.   Yes [provider]  Methylcellulose, Laxative, 500 MG TABS Take by mouth daily.   Yes [provider]  omeprazole (PRILOSEC) 20 MG capsule Take 20 mg by mouth daily.   Yes [provider]  acetaminophen (TYLENOL) 325 MG tablet Take 650 mg by mouth every 6 (six) hours as needed. Thru G tube    [provider]  Alum & Mag Hydroxide-Simeth (MAGIC MOUTHWASH) SOLN Take by mouth.      [provider]  bisacodyl (DULCOLAX) 10 MG  suppository Place 10 mg rectally as needed.      [provider]  Dextromethorphan-Guaifenesin (DIABETIC TUSSIN DM MAX ST PO) Take by mouth. 10ml enteral tub every 6 hours     [provider]  famotidine (PEPCID) 20 MG tablet 20 mg daily. Per tube daily     [provider]  Flavoring Agent (CHLORHEXIDINE) CONC Take 15 mLs by mouth. Rinse twice daily     [provider]  lacosamide (VIMPAT) 50 MG TABS tablet Take 100 mg by mouth 2 (two) times daily.    [provider]  metoCLOPramide (REGLAN) 5 MG/5ML solution Take by mouth 4 (four) times daily -  before meals and at bedtime.      [provider]  Nutritional Supplements (OSMOLITE) LIQD Take by mouth. 40 mL per hour     [provider]  Nutritional Supplements (PROSOURCE) LIQD Give 30 mLs by tube 2 (two) times daily.      [provider]  nystatin (MYCOSTATIN) 100000 UNIT/ML suspension 5mL by mouth every 6 hours     [provider]  ondansetron (ZOFRAN) 4 MG tablet Take 4 mg by mouth every 4 (four) hours as needed.      [provider]  potassium chloride 20 MEQ/15ML (10%) solution Take 20 mEq by mouth  daily. Thru g tube     [provider]  potassium chloride SA (KLOR-CON M20) 20 MEQ tablet 20 mEq. Per tube daily     [provider]  senna (SENOKOT) 8.6 MG tablet Take 1 tablet by mouth 2 (two) times daily as needed.      [provider]  Skin Protectants, Misc. (EUCERIN) cream Apply topically as needed.      [provider]  thiamine 100 MG tablet Give 100 mg by tube daily.      [provider]    Allergies as of 10/30/2016 - Review Complete 09/08/2016  Allergen Reaction Noted  . Predicort [prednisolone acetate]  03/27/2011    Family History  Problem Relation Age of Onset  . Prostate cancer Father   . Breast cancer Maternal Aunt   . Breast cancer Maternal Aunt   . Cerebral palsy Son     Social History    Social History  . Marital status: Widowed    Spouse name: N/A  . Number of children: 2  . Years of education: N/A   Occupational History  . medical transcript    Social History Main Topics  . Smoking status: Never Smoker  . Smokeless tobacco: Never Used  . Alcohol use No  . Drug use: No  . Sexual activity: Not on file   Other Topics Concern  . Not on file   Social History Narrative  . No narrative on file    Review of Systems: See HPI, otherwise negative ROS  Physical Exam: BP 121/72   Pulse 80   Temp 98.5 F (36.9 C) (Tympanic)   Resp 16   Ht 5\' 5"  (1.651 m)   Wt 70.8 kg (156 lb)   SpO2 98%   BMI 25.96 kg/m  General:   Alert,  pleasant and cooperative in NAD Head:  Normocephalic and atraumatic. Neck:  Supple; no masses or thyromegaly. Lungs:  Clear throughout to auscultation.    Heart:  Regular rate and rhythm. Abdomen:  Soft, nontender and nondistended. Normal bowel sounds, without guarding, and without rebound.   Neurologic:  Alert and  oriented x4;  grossly normal neurologically.  Impression/Plan: [name] is here for an endoscopy and colonoscopy to be performed for rectal bleeding and GERD  And iron def anemia.  Risks, benefits, limitations, and alternatives regarding  endoscopy and colonoscopy have been reviewed with the patient.  Questions have been answered.  All parties agreeable.   Lynnae PrudeELLIOTT, Antwoin Lackey, MD  12/31/2016, 10:05 AM

## 2017-01-01 ENCOUNTER — Encounter: Payer: Self-pay | Admitting: Unknown Physician Specialty

## 2017-07-04 ENCOUNTER — Encounter: Payer: Self-pay | Admitting: Emergency Medicine

## 2017-07-04 ENCOUNTER — Inpatient Hospital Stay
Admission: EM | Admit: 2017-07-04 | Discharge: 2017-07-07 | DRG: 641 | Disposition: A | Discharge status: Home / Self Care | Payer: Medicare Other | Attending: Family Medicine | Admitting: Family Medicine

## 2017-07-04 ENCOUNTER — Other Ambulatory Visit: Payer: Self-pay

## 2017-07-04 DIAGNOSIS — E860 Dehydration: Secondary | ICD-10-CM | POA: Diagnosis not present

## 2017-07-04 DIAGNOSIS — I491 Atrial premature depolarization: Secondary | ICD-10-CM | POA: Diagnosis present

## 2017-07-04 DIAGNOSIS — A0819 Acute gastroenteropathy due to other small round viruses: Secondary | ICD-10-CM | POA: Diagnosis present

## 2017-07-04 DIAGNOSIS — I10 Essential (primary) hypertension: Secondary | ICD-10-CM | POA: Diagnosis present

## 2017-07-04 DIAGNOSIS — K529 Noninfective gastroenteritis and colitis, unspecified: Secondary | ICD-10-CM | POA: Diagnosis present

## 2017-07-04 DIAGNOSIS — R197 Diarrhea, unspecified: Secondary | ICD-10-CM

## 2017-07-04 DIAGNOSIS — G40909 Epilepsy, unspecified, not intractable, without status epilepticus: Secondary | ICD-10-CM | POA: Diagnosis present

## 2017-07-04 DIAGNOSIS — Z9049 Acquired absence of other specified parts of digestive tract: Secondary | ICD-10-CM

## 2017-07-04 DIAGNOSIS — Z7982 Long term (current) use of aspirin: Secondary | ICD-10-CM

## 2017-07-04 DIAGNOSIS — I480 Paroxysmal atrial fibrillation: Secondary | ICD-10-CM

## 2017-07-04 DIAGNOSIS — N179 Acute kidney failure, unspecified: Secondary | ICD-10-CM | POA: Diagnosis present

## 2017-07-04 DIAGNOSIS — Z8673 Personal history of transient ischemic attack (TIA), and cerebral infarction without residual deficits: Secondary | ICD-10-CM

## 2017-07-04 DIAGNOSIS — Z79899 Other long term (current) drug therapy: Secondary | ICD-10-CM

## 2017-07-04 LAB — URINALYSIS, COMPLETE (UACMP) WITH MICROSCOPIC
GLUCOSE, UA: NEGATIVE mg/dL
HGB URINE DIPSTICK: NEGATIVE
Ketones, ur: 5 mg/dL — AB
NITRITE: NEGATIVE
PH: 5 (ref 5.0–8.0)
Protein, ur: 100 mg/dL — AB
SPECIFIC GRAVITY, URINE: 1.021 (ref 1.005–1.030)

## 2017-07-04 LAB — COMPREHENSIVE METABOLIC PANEL
ALBUMIN: 4.4 g/dL (ref 3.5–5.0)
ALT: 19 U/L (ref 14–54)
AST: 33 U/L (ref 15–41)
Alkaline Phosphatase: 72 U/L (ref 38–126)
Anion gap: 11 (ref 5–15)
BILIRUBIN TOTAL: 1 mg/dL (ref 0.3–1.2)
BUN: 30 mg/dL — ABNORMAL HIGH (ref 6–20)
CALCIUM: 9.3 mg/dL (ref 8.9–10.3)
CHLORIDE: 105 mmol/L (ref 101–111)
CO2: 20 mmol/L — ABNORMAL LOW (ref 22–32)
CREATININE: 1.43 mg/dL — AB (ref 0.44–1.00)
GFR calc Af Amer: 41 mL/min — ABNORMAL LOW (ref >60)
GFR calc non Af Amer: 35 mL/min — ABNORMAL LOW (ref >60)
Glucose, Bld: 161 mg/dL — ABNORMAL HIGH (ref 65–99)
POTASSIUM: 4.4 mmol/L (ref 3.5–5.1)
SODIUM: 136 mmol/L (ref 135–145)
TOTAL PROTEIN: 8.2 g/dL — AB (ref 6.5–8.1)

## 2017-07-04 LAB — CBC
HCT: 46.2 % (ref 35.0–47.0)
HEMOGLOBIN: 15.2 g/dL (ref 12.0–16.0)
MCH: 27 pg (ref 26.0–34.0)
MCHC: 32.9 g/dL (ref 32.0–36.0)
MCV: 82.1 fL (ref 80.0–100.0)
PLATELETS: 222 10*3/uL (ref 150–440)
RBC: 5.63 MIL/uL — AB (ref 3.80–5.20)
RDW: 14.3 % (ref 11.5–14.5)
WBC: 14.8 10*3/uL — AB (ref 3.6–11.0)

## 2017-07-04 LAB — LIPASE, BLOOD: Lipase: 23 U/L (ref 11–51)

## 2017-07-04 MED ORDER — ACETAMINOPHEN 325 MG PO TABS
650.0000 mg | ORAL_TABLET | Freq: Once | ORAL | Status: AC
  Administered 2017-07-04: 650 mg via ORAL
  Filled 2017-07-04: qty 2

## 2017-07-04 MED ORDER — SODIUM CHLORIDE 0.9 % IV BOLUS (SEPSIS)
1000.0000 mL | Freq: Once | INTRAVENOUS | Status: AC
  Administered 2017-07-04: 1000 mL via INTRAVENOUS

## 2017-07-04 MED ORDER — SODIUM CHLORIDE 0.9 % IV SOLN
1000.0000 mL | Freq: Once | INTRAVENOUS | Status: AC
  Administered 2017-07-04: 1000 mL via INTRAVENOUS

## 2017-07-04 NOTE — ED Provider Notes (Signed)
[facility] Emergency Department Provider Note  ____________________________________________   First MD Initiated Contact with Patient 07/04/17 2321     (approximate)  I have reviewed the triage vital signs and the nursing notes.   HISTORY  Chief Complaint Diarrhea   HPI [name] is a 72 y.o. female who comes to the emergency department with roughly 24 hours of loose watery stools and fatigue.  She has had 8-10 stools today.  Associated with nausea and several episodes of vomiting.  She has mild severity cramping lower abdominal discomfort worse when having diarrhea improved when not.  She has had no recent antibiotics.  She feels like her symptoms all began after eating out at dinner.  She has no history of cardiac disease.  No history of atrial fibrillation.  Past Medical History:  Diagnosis Date  . Acute respiratory failure (HCC)    Secondary to aspiration pneumonia   . Altered mental status    Secondary to viral herpes simplex virus encephalitis  . Anemia   . Bilateral pneumonia   . Dysphasia   . Encephalitis   . Hyperglycemia   . Hypertension   . Hyponatremia   . Seizures (HCC)   . Stroke [facility](HCC)     Patient Active Problem List   Diagnosis Date Noted  . Acute gastroenteritis 07/05/2017  . Acute respiratory failure (HCC) 03/27/2011    Past Surgical History:  Procedure Laterality Date  . CHOLECYSTECTOMY    . COLONOSCOPY WITH PROPOFOL N/A 12/31/2016   Procedure: COLONOSCOPY WITH PROPOFOL;  Surgeon: Scot JunElliott, Robert T, MD;  [facility];  Service: Endoscopy;  Laterality: N/A;  . ESOPHAGOGASTRODUODENOSCOPY (EGD) WITH PROPOFOL N/A 12/31/2016   Procedure: ESOPHAGOGASTRODUODENOSCOPY (EGD) WITH PROPOFOL;  Surgeon: Scot JunElliott, Robert T, MD;  [facility] END[facility]COPY;  Service: Endoscopy;  Laterality: N/A;  . TONSILLECTOMY    . TUBAL LIGATION      Prior to Admission medications   Medication Sig Start Date End Date Taking? Authorizing  Provider  acetaminophen (TYLENOL) 325 MG tablet Take 650 mg by mouth every 6 (six) hours as needed. Thru G tube   Yes [provider]  aspirin 81 MG tablet Take 81 mg by mouth daily.     Yes [provider]  calcium-vitamin D (OSCAL WITH D) 500-200 MG-UNIT tablet Take 1 tablet by mouth 2 (two) times daily.   Yes [provider]  folic acid (FOLVITE) 1 MG tablet Take 1 mg by mouth daily. Per tube once daily     Yes [provider]  guaiFENesin (MUCINEX) 600 MG 12 hr tablet Take 600 mg by mouth 2 (two) times daily.   Yes [provider]  levothyroxine (SYNTHROID, LEVOTHROID) 50 MCG tablet Take 50 mcg by mouth daily before breakfast.   Yes [provider]  Alum & Mag Hydroxide-Simeth (MAGIC MOUTHWASH) SOLN Take by mouth.      [provider]  bisacodyl (DULCOLAX) 10 MG suppository Place 10 mg rectally as needed.      [provider]  Dextromethorphan-Guaifenesin (DIABETIC TUSSIN DM MAX ST PO) Take by mouth. 10ml enteral tub every 6 hours     [provider]  docusate sodium (COLACE) 100 MG capsule Take 100 mg by mouth 2 (two) times daily.    [provider]  famotidine (PEPCID) 20 MG tablet 20 mg daily. Per tube daily     [provider]  Flavoring Agent (CHLORHEXIDINE) CONC Take 15 mLs by mouth. Rinse twice daily  [provider]  lacosamide (VIMPAT) 50 MG TABS tablet Take 100 mg by mouth 2 (two) times daily.    [provider]  Methylcellulose, Laxative, 500 MG TABS Take by mouth daily.    [provider]  metoCLOPramide (REGLAN) 5 MG/5ML solution Take by mouth 4 (four) times daily -  before meals and at bedtime.      [provider]  Nutritional Supplements (OSMOLITE) LIQD Take by mouth. 40 mL per hour     [provider]  Nutritional Supplements (PROSOURCE) LIQD Give 30 mLs by tube 2 (two) times daily.      [provider]  nystatin  (MYCOSTATIN) 100000 UNIT/ML suspension 5mL by mouth every 6 hours     [provider]  omeprazole (PRILOSEC) 20 MG capsule Take 20 mg by mouth daily.    [provider]  ondansetron (ZOFRAN) 4 MG tablet Take 4 mg by mouth every 4 (four) hours as needed.      [provider]  potassium chloride 20 MEQ/15ML (10%) solution Take 20 mEq by mouth daily. Thru g tube     [provider]  potassium chloride SA (KLOR-CON M20) 20 MEQ tablet 20 mEq. Per tube daily     [provider]  senna (SENOKOT) 8.6 MG tablet Take 1 tablet by mouth 2 (two) times daily as needed.      [provider]  Skin Protectants, Misc. (EUCERIN) cream Apply topically as needed.      [provider]  thiamine 100 MG tablet Give 100 mg by tube daily.      [provider]    Allergies Predicort [prednisolone acetate]  Family History  Problem Relation Age of Onset  . Prostate cancer Father   . Breast cancer Maternal Aunt   . Breast cancer Maternal Aunt   . Cerebral palsy Son     Social History Social History   Tobacco Use  . Smoking status: Never Smoker  . Smokeless tobacco: Never Used  Substance Use Topics  . Alcohol use: No  . Drug use: No    Review of Systems Constitutional: No fever/chills Eyes: No visual changes. ENT: No sore throat. Cardiovascular: Denies chest pain. Respiratory: Denies shortness of breath. Gastrointestinal: Positive for abdominal pain.  Positive for nausea, positive for vomiting.  Positive for diarrhea.  No constipation. Genitourinary: Negative for dysuria. Musculoskeletal: Negative for back pain. Skin: Negative for rash. Neurological: Negative for headaches, focal weakness or numbness.   ____________________________________________   PHYSICAL EXAM:  VITAL SIGNS: ED Triage Vitals  Enc Vitals Group     BP 07/04/17 1824 94/73     Pulse Rate 07/04/17 1824 86     Resp 07/04/17 1824 16     Temp 07/04/17 1824  98.4 F (36.9 C)     Temp Source 07/04/17 1824 Oral     SpO2 07/04/17 1824 97 %     Weight 07/04/17 1825 140 lb (63.5 kg)     Height 07/04/17 1825 5\' 5"  (1.651 m)     Head Circumference --      Peak Flow --      Pain Score 07/04/17 1825 8     Pain Loc --      Pain Edu? --      Excl. in GC? --     Constitutional: Pleasant cooperative no acute distress Eyes: PERRL EOMI. Head: Atraumatic. Nose: No congestion/rhinnorhea. Mouth/Throat: No trismus Neck: No stridor.   Cardiovascular: Irregularly irregular and tachycardic Respiratory: Normal  respiratory effort.  No retractions. Lungs CTAB and moving good air Gastrointestinal: Soft diffuse mild tenderness with no focality no rebound or guarding no peritonitis Musculoskeletal: No lower extremity edema   Neurologic:  Normal speech and language. No gross focal neurologic deficits are appreciated. Skin:  Skin is warm, dry and intact. No rash noted. Psychiatric: Strange affect and appears to have limited inhibition   ____________________________________________   DIFFERENTIAL includes but not limited to  Dehydration, acute kidney injury, Clostridium difficile, colitis, atrial fibrillation ____________________________________________   LABS (all labs ordered are listed, but only abnormal results are displayed)  Labs Reviewed  COMPREHENSIVE METABOLIC PANEL - Abnormal; Notable for the following components:      Result Value   CO2 20 (*)    Glucose, Bld 161 (*)    BUN 30 (*)    Creatinine, Ser 1.43 (*)    Total Protein 8.2 (*)    GFR calc non Af Amer 35 (*)    GFR calc Af Amer 41 (*)    All other components within normal limits  CBC - Abnormal; Notable for the following components:   WBC 14.8 (*)    RBC 5.63 (*)    All other components within normal limits  URINALYSIS, COMPLETE (UACMP) WITH MICROSCOPIC - Abnormal; Notable for the following components:   Color, Urine AMBER (*)    APPearance CLOUDY (*)    Bilirubin Urine SMALL  (*)    Ketones, ur 5 (*)    Protein, ur 100 (*)    Leukocytes, UA SMALL (*)    Bacteria, UA MANY (*)    Squamous Epithelial / LPF 6-30 (*)    All other components within normal limits  TROPONIN I - Abnormal; Notable for the following components:   Troponin I 0.07 (*)    All other components within normal limits  LIPASE, BLOOD  TROPONIN I  TROPONIN I  BASIC METABOLIC PANEL  CBC    Lab work reviewed by me with decrease in GFR consistent with acute dehydration __________________________________________  EKG  ED ECG REPORT I, Merrily Brittle, the attending physician, personally viewed and interpreted this ECG.  Date: 07/04/2017 EKG Time:  Rate: 161 Rhythm: She will fibrillation with rapid ventricular response QRS Axis: normal Intervals: normal ST/T Wave abnormalities: normal Narrative Interpretation: no evidence of acute ischemia  ____________________________________________  RADIOLOGY   ____________________________________________   PROCEDURES  Procedure(s) performed: no  .Critical Care Performed by: Merrily Brittle, MD Authorized by: Merrily Brittle, MD   Critical care provider statement:    Critical care time (minutes):  30   Critical care time was exclusive of:  Separately billable procedures and treating other patients   Critical care was time spent personally by me on the following activities:  Development of treatment plan with patient or surrogate, discussions with consultants, evaluation of patient's response to treatment, examination of patient, obtaining history from patient or surrogate, ordering and performing treatments and interventions, ordering and review of laboratory studies, ordering and review of radiographic studies, pulse oximetry, re-evaluation of patient's condition and review of old charts     Critical Care performed: yes  Observation: no ____________________________________________   INITIAL IMPRESSION / ASSESSMENT AND PLAN / ED  COURSE  Pertinent labs & imaging results that were available during my care of the patient were reviewed by me and considered in my medical decision making (see chart for details).  On arrival the patient is in atrial fibrillation with rapid ventricular response with a borderline blood pressure in the 80s  over 60s.  She does respond nicely to fluid bolus.  Her abdomen is diffusely mildly tender.  She has hyperactive bowel sounds.  Her A. fib is new and likely secondary to profound dehydration as evidenced by her acute kidney injury.  At this point she does require inpatient admission for continued IV fluid resuscitation and monitoring of her electrolytes.  We will defer nodal blockade at this time as the patient is under resuscitated.  I discussed with the patient and her son at bedside verbalized understanding agree with plan.  I then discussed with the hospitalist who is graciously agreed to admit the patient to her service.      ____________________________________________   FINAL CLINICAL IMPRESSION(S) / ED DIAGNOSES  Final diagnoses:  Diarrhea in adult patient  Dehydration  Paroxysmal atrial fibrillation (HCC)  Acute kidney injury (HCC)      NEW MEDICATIONS STARTED DURING THIS VISIT:  Current Discharge Medication List       Note:  This document was prepared using Dragon voice recognition software and may include unintentional dictation errors.     Merrily Brittle, MD 07/05/17 [phone]

## 2017-07-04 NOTE — ED Notes (Signed)
Verbal report to April;

## 2017-07-04 NOTE — ED Notes (Signed)
Assisted pt to bathroom via wheelchair; pt voided and had loose stool; pt says it's time to take her Mucinex and Calcium with Vitamin D tablet; pt says it's timed and needs to take that now; spoke with Dr Mayford KnifeWilliams who said it was fine for pt to take those medications; pt taken back out to lobby with family;

## 2017-07-04 NOTE — ED Notes (Signed)
Pt also complains of posterior neck pain and mid to upper back pain since yesterday. Pt states pain is worse with movement. Pt states this am she began to feel dizzy when standing.

## 2017-07-04 NOTE — ED Triage Notes (Signed)
Pt to triage via WC, reports diarrhea x 2 days and had one episode of emesis, reports took immodium w/ some relief.  Pt reports feeling weak.

## 2017-07-04 NOTE — ED Notes (Signed)
Pt given tylenol as ordered for back pain;

## 2017-07-04 NOTE — ED Notes (Signed)
Dr. Cyril Loosenkinner notified of pt's a fib with rvr, additional ekg obtained and provided to md. Order for NS bolus received, no additional orders received.

## 2017-07-05 ENCOUNTER — Other Ambulatory Visit: Payer: Self-pay

## 2017-07-05 ENCOUNTER — Encounter: Payer: Self-pay | Admitting: Internal Medicine

## 2017-07-05 DIAGNOSIS — I10 Essential (primary) hypertension: Secondary | ICD-10-CM | POA: Diagnosis present

## 2017-07-05 DIAGNOSIS — N179 Acute kidney failure, unspecified: Secondary | ICD-10-CM | POA: Diagnosis present

## 2017-07-05 DIAGNOSIS — K529 Noninfective gastroenteritis and colitis, unspecified: Secondary | ICD-10-CM | POA: Diagnosis present

## 2017-07-05 DIAGNOSIS — Z79899 Other long term (current) drug therapy: Secondary | ICD-10-CM | POA: Diagnosis not present

## 2017-07-05 DIAGNOSIS — E860 Dehydration: Secondary | ICD-10-CM | POA: Diagnosis present

## 2017-07-05 DIAGNOSIS — Z8673 Personal history of transient ischemic attack (TIA), and cerebral infarction without residual deficits: Secondary | ICD-10-CM | POA: Diagnosis not present

## 2017-07-05 DIAGNOSIS — A0819 Acute gastroenteropathy due to other small round viruses: Secondary | ICD-10-CM | POA: Diagnosis present

## 2017-07-05 DIAGNOSIS — G40909 Epilepsy, unspecified, not intractable, without status epilepticus: Secondary | ICD-10-CM | POA: Diagnosis present

## 2017-07-05 DIAGNOSIS — I491 Atrial premature depolarization: Secondary | ICD-10-CM | POA: Diagnosis present

## 2017-07-05 DIAGNOSIS — Z9049 Acquired absence of other specified parts of digestive tract: Secondary | ICD-10-CM | POA: Diagnosis not present

## 2017-07-05 DIAGNOSIS — Z7982 Long term (current) use of aspirin: Secondary | ICD-10-CM | POA: Diagnosis not present

## 2017-07-05 DIAGNOSIS — I480 Paroxysmal atrial fibrillation: Secondary | ICD-10-CM | POA: Diagnosis present

## 2017-07-05 LAB — BASIC METABOLIC PANEL
Anion gap: 8 (ref 5–15)
BUN: 30 mg/dL — AB (ref 6–20)
CHLORIDE: 110 mmol/L (ref 101–111)
CO2: 19 mmol/L — AB (ref 22–32)
CREATININE: 0.9 mg/dL (ref 0.44–1.00)
Calcium: 8.5 mg/dL — ABNORMAL LOW (ref 8.9–10.3)
GFR calc non Af Amer: >60 mL/min (ref >60)
Glucose, Bld: 102 mg/dL — ABNORMAL HIGH (ref 65–99)
POTASSIUM: 3.7 mmol/L (ref 3.5–5.1)
SODIUM: 137 mmol/L (ref 135–145)

## 2017-07-05 LAB — GASTROINTESTINAL PANEL BY PCR, STOOL (REPLACES STOOL CULTURE)

## 2017-07-05 LAB — CBC
HEMATOCRIT: 40.5 % (ref 35.0–47.0)
Hemoglobin: 13.4 g/dL (ref 12.0–16.0)
MCH: 27 pg (ref 26.0–34.0)
MCHC: 33 g/dL (ref 32.0–36.0)
MCV: 81.9 fL (ref 80.0–100.0)
PLATELETS: 167 10*3/uL (ref 150–440)
RBC: 4.94 MIL/uL (ref 3.80–5.20)
RDW: 14.6 % — ABNORMAL HIGH (ref 11.5–14.5)
WBC: 6.9 10*3/uL (ref 3.6–11.0)

## 2017-07-05 LAB — TROPONIN I
TROPONIN I: 0.07 ng/mL — AB (ref <0.03)
Troponin I: 0.07 ng/mL (ref <0.03)
Troponin I: 0.07 ng/mL (ref <0.03)

## 2017-07-05 MED ORDER — SODIUM CHLORIDE 0.9 % IV SOLN
INTRAVENOUS | Status: DC
  Administered 2017-07-05 – 2017-07-07 (×3): via INTRAVENOUS

## 2017-07-05 MED ORDER — VITAMIN B-1 100 MG PO TABS
100.0000 mg | ORAL_TABLET | Freq: Every day | ORAL | Status: DC
  Administered 2017-07-05 – 2017-07-07 (×3): 100 mg via ORAL
  Filled 2017-07-05 (×3): qty 1

## 2017-07-05 MED ORDER — SIMETHICONE 80 MG PO CHEW
80.0000 mg | CHEWABLE_TABLET | Freq: Four times a day (QID) | ORAL | Status: DC | PRN
  Administered 2017-07-05 – 2017-07-07 (×2): 80 mg via ORAL
  Filled 2017-07-05 (×3): qty 1

## 2017-07-05 MED ORDER — PANTOPRAZOLE SODIUM 40 MG PO TBEC
40.0000 mg | DELAYED_RELEASE_TABLET | Freq: Every day | ORAL | Status: DC
  Administered 2017-07-06 – 2017-07-07 (×2): 40 mg via ORAL
  Filled 2017-07-05 (×2): qty 1

## 2017-07-05 MED ORDER — ACETAMINOPHEN 650 MG RE SUPP: 650.0000 mg | Freq: Four times a day (QID) | RECTAL | Status: DC | PRN

## 2017-07-05 MED ORDER — ONDANSETRON HCL 4 MG PO TABS: 4.0000 mg | ORAL_TABLET | Freq: Four times a day (QID) | ORAL | Status: DC | PRN

## 2017-07-05 MED ORDER — ACETAMINOPHEN 325 MG PO TABS
650.0000 mg | ORAL_TABLET | Freq: Four times a day (QID) | ORAL | Status: DC | PRN
  Administered 2017-07-05: 650 mg via ORAL
  Filled 2017-07-05: qty 2

## 2017-07-05 MED ORDER — ONDANSETRON HCL 4 MG/2ML IJ SOLN: 4.0000 mg | Freq: Four times a day (QID) | INTRAMUSCULAR | Status: DC | PRN

## 2017-07-05 MED ORDER — LACOSAMIDE 50 MG PO TABS
100.0000 mg | ORAL_TABLET | Freq: Two times a day (BID) | ORAL | Status: DC
  Administered 2017-07-05: 100 mg via ORAL
  Filled 2017-07-05 (×3): qty 2

## 2017-07-05 MED ORDER — ENSURE ENLIVE PO LIQD: 237.0000 mL | Freq: Two times a day (BID) | ORAL | Status: DC

## 2017-07-05 MED ORDER — DILTIAZEM HCL 60 MG PO TABS
30.0000 mg | ORAL_TABLET | Freq: Four times a day (QID) | ORAL | Status: DC
  Administered 2017-07-05 – 2017-07-06 (×2): 30 mg via ORAL
  Filled 2017-07-05 (×2): qty 0.5
  Filled 2017-07-05 (×2): qty 1
  Filled 2017-07-05 (×2): qty 0.5
  Filled 2017-07-05: qty 1
  Filled 2017-07-05 (×3): qty 0.5

## 2017-07-05 MED ORDER — APIXABAN 5 MG PO TABS
5.0000 mg | ORAL_TABLET | Freq: Two times a day (BID) | ORAL | Status: DC
  Administered 2017-07-05 (×3): 5 mg via ORAL
  Filled 2017-07-05 (×3): qty 1

## 2017-07-05 MED ORDER — FLORANEX PO PACK
1.0000 g | PACK | Freq: Three times a day (TID) | ORAL | Status: DC
  Administered 2017-07-06 – 2017-07-07 (×4): 1 g via ORAL
  Filled 2017-07-05 (×7): qty 1

## 2017-07-05 MED ORDER — CALCIUM CARBONATE-VITAMIN D 500-200 MG-UNIT PO TABS
1.0000 | ORAL_TABLET | Freq: Two times a day (BID) | ORAL | Status: DC
  Administered 2017-07-05 – 2017-07-07 (×5): 1 via ORAL
  Filled 2017-07-05 (×5): qty 1

## 2017-07-05 MED ORDER — LEVOTHYROXINE SODIUM 50 MCG PO TABS
50.0000 ug | ORAL_TABLET | Freq: Every day | ORAL | Status: DC
  Administered 2017-07-05 – 2017-07-07 (×3): 50 ug via ORAL
  Filled 2017-07-05 (×3): qty 1

## 2017-07-05 MED ORDER — ASPIRIN EC 81 MG PO TBEC
81.0000 mg | DELAYED_RELEASE_TABLET | Freq: Every day | ORAL | Status: DC
  Administered 2017-07-05 – 2017-07-07 (×3): 81 mg via ORAL
  Filled 2017-07-05 (×3): qty 1

## 2017-07-05 NOTE — ED Notes (Signed)
Awaiting eliquis and cardiazem from pharmacy, pharmacy previously.

## 2017-07-05 NOTE — Consult Note (Signed)
[facility]  CARDIOLOGY CONSULT NOTE  Patient ID: [name] MRN: [id number] DOB/AGE: 74/10/1945 74 y.o.  Admit date: 07/04/2017 Referring Physician Salary Primary Physician [place] Primary Cardiologist  Reason for Consultation atrial fibrillation  HPI: 72 year old female referred for evaluation of atrial fibrillation.  The patient presents with 2-day history of diarrhea, nausea and vomiting.  In the emergency room, the patient was noted to be in atrial fibrillation with a rapid ventricular rate.  Patient was admitted to telemetry, she currently is in sinus rhythm with frequent premature atrial contractions.  She denies chest pain, palpitations or heart racing.  Admission labs notable for prolonged elevated troponin of 0.07, 0.07, 0.07.  Patient was started on diltiazem 30 mg p.o. every 6 hours.  Review of systems complete and found to be negative unless listed above     Past Medical History:  Diagnosis Date  . Acute respiratory failure (HCC)    Secondary to aspiration pneumonia   . Altered mental status    Secondary to viral herpes simplex virus encephalitis  . Anemia   . Bilateral pneumonia   . Dysphasia   . Encephalitis   . Hyperglycemia   . Hypertension   . Hyponatremia   . Seizures (HCC)   . Stroke [facility](HCC)     Past Surgical History:  Procedure Laterality Date  . CHOLECYSTECTOMY    . COLONOSCOPY WITH PROPOFOL N/A 12/31/2016   Procedure: COLONOSCOPY WITH PROPOFOL;  Surgeon: Scot JunElliott, Robert T, MD;  [facility];  Service: Endoscopy;  Laterality: N/A;  . ESOPHAGOGASTRODUODENOSCOPY (EGD) WITH PROPOFOL N/A 12/31/2016   Procedure: ESOPHAGOGASTRODUODENOSCOPY (EGD) WITH PROPOFOL;  Surgeon: Scot JunElliott, Robert T, MD;  [facility];  Service: Endoscopy;  Laterality: N/A;  . TONSILLECTOMY    . TUBAL LIGATION      Medications Prior to Admission  Medication Sig Dispense Refill Last Dose  . acetaminophen (TYLENOL) 325 MG tablet Take 650 mg by mouth every 6 (six)  hours as needed. Thru G tube   prn at prn  . aspirin 81 MG tablet Take 81 mg by mouth daily.     07/04/2017 at AM  . Calcium Carb-Cholecalciferol (CALCIUM-VITAMIN D) 500-200 MG-UNIT tablet Take 1 tablet by mouth 2 (two) times daily.   07/04/2017 at AM  . calcium-vitamin D (OSCAL WITH D) 500-200 MG-UNIT tablet Take 1 tablet by mouth 2 (two) times daily.   07/04/2017 at 2300  . docusate sodium (COLACE) 100 MG capsule Take 100 mg by mouth 2 (two) times daily.   07/04/2017 at AM  . folic acid (FOLVITE) 1 MG tablet Take 1 mg by mouth daily. Per tube once daily     07/04/2017 at AM  . guaiFENesin (MUCINEX) 600 MG 12 hr tablet Take 600 mg by mouth 2 (two) times daily.   07/04/2017 at 2300  . levothyroxine (SYNTHROID, LEVOTHROID) 50 MCG tablet Take 50 mcg by mouth daily before breakfast.   07/04/2017 at AM  . Nutritional Supplements (OSMOLITE) LIQD Take by mouth. 40 mL per hour    PRN at PRN  . omeprazole (PRILOSEC) 20 MG capsule Take 20 mg by mouth daily.   07/05/2017 at AM  . Skin Protectants, Misc. (EUCERIN) cream Apply topically as needed.     PRN at PRN   Social History   Socioeconomic History  . Marital status: Widowed    Spouse name: Not on file  . Number of children: 2  . Years of education: Not on file  . Highest education level: Not on file  Social Needs  . Financial resource strain: Not on file  . Food insecurity - worry: Not on file  . Food insecurity - inability: Not on file  . Transportation needs - medical: Not on file  . Transportation needs - non-medical: Not on file  Occupational History  . Occupation: medical transcript  Tobacco Use  . Smoking status: Never Smoker  . Smokeless tobacco: Never Used  Substance and Sexual Activity  . Alcohol use: No  . Drug use: No  . Sexual activity: No  Other Topics Concern  . Not on file  Social History Narrative  . Not on file    Family History  Problem Relation Age of Onset  . Prostate cancer Father   . Breast cancer Maternal Aunt   . Breast  cancer Maternal Aunt   . Cerebral palsy Son       Review of systems complete and found to be negative unless listed above      PHYSICAL EXAM  General: Well developed, well nourished, in no acute distress HEENT:  Normocephalic and atramatic Neck:  No JVD.  Lungs: Clear bilaterally to auscultation and percussion. Heart: HRRR . Normal S1 and S2 without gallops or murmurs.  Abdomen: Bowel sounds are positive, abdomen soft and non-tender  Msk:  Back normal, normal gait. Normal strength and tone for age. Extremities: No clubbing, cyanosis or edema.   Neuro: Alert and oriented X 3. Psych:  Good affect, responds appropriately  Labs:   Lab Results  Component Value Date   WBC 6.9 07/05/2017   HGB 13.4 07/05/2017   HCT 40.5 07/05/2017   MCV 81.9 07/05/2017   PLT 167 07/05/2017    Recent Labs  Lab 07/04/17 1829 07/05/17 0617  NA 136 137  K 4.4 3.7  CL 105 110  CO2 20* 19*  BUN 30* 30*  CREATININE 1.43* 0.90  CALCIUM 9.3 8.5*  PROT 8.2*  --   BILITOT 1.0  --   ALKPHOS 72  --   ALT 19  --   AST 33  --   GLUCOSE 161* 102*   Lab Results  Component Value Date   CKTOTAL 70 08/29/2011   CKMB 3.3 08/29/2011   TROPONINI 0.07 (HH) 07/05/2017    Lab Results  Component Value Date   CHOL 222 (H) 08/28/2011   Lab Results  Component Value Date   HDL 55 08/28/2011   Lab Results  Component Value Date   LDLCALC 142 (H) 08/28/2011   Lab Results  Component Value Date   TRIG 127 08/28/2011   No results found for: CHOLHDL No results found for: LDLDIRECT    Radiology: No results found.  EKG: Atrial fibrillation with rapid ventricular rate  ASSESSMENT AND PLAN:   1.  Atrial fibrillation with rapid ventricular rate, in the setting of diarrhea, nausea, vomiting and dehydration, converted to sinus rhythm, currently on low-dose diltiazem 30 mg p.o. every 6 hours. 2.  Borderline elevated troponin, without peak or trough, in the setting of atrial fibrillation with rapid  ventricular rate, likely demand supply ischemia, not due to acute coronary syndrome 3.  Diarrhea/nausea/vomiting, likely acute viral gastroenteritis  Recommendations  1.  Agree with overall current therapy 2.  IV fluid resuscitation 3.  Defer full dose anticoagulation 4.  Defer chronic anticoagulation for atrial fibrillation, in the setting of probable reversible cause 5.  Continue diltiazem 30 mg every 6 hours for now, may up titrate as needed 6.  Review 2D echocardiogram  Signed: Marcina Millard MD,PhD,  [facility] 07/05/2017, 2:34 PM

## 2017-07-05 NOTE — H&P (Addendum)
[facility] - [place] at [facility]   PATIENT NAME: [name]    MR#:  [id number]  DATE OF BIRTH:  02/07/1946  DATE OF ADMISSION:  07/04/2017  PRIMARY CARE PHYSICIAN: Mickel Fuchs, MD   REQUESTING/REFERRING PHYSICIAN:   CHIEF COMPLAINT:   Chief Complaint  Patient presents with  . Diarrhea    HISTORY OF PRESENT ILLNESS: [name]  is a 72 y.o. female with a known history of hypertension, seizure disorder, CVA presented to the emergency room with nausea and vomiting and diarrhea.  Patient had 2 episodes of vomiting on and off day ago.  She also had an episode of diarrhea which was watery stool.  Patient ate some lasagna 2 days ago which was procured from outside restaurant.  No blood in the stool.  Diarrhea is improved.  Patient was found to have atrial fibrillation when she presented to the emergency room.  No complaints of any chest pain, shortness of breath.  Has some palpitations on and off.  Hospitalist service was consulted.  PAST MEDICAL HISTORY:   Past Medical History:  Diagnosis Date  . Acute respiratory failure (HCC)    Secondary to aspiration pneumonia   . Altered mental status    Secondary to viral herpes simplex virus encephalitis  . Anemia   . Bilateral pneumonia   . Dysphasia   . Encephalitis   . Hyperglycemia   . Hypertension   . Hyponatremia   . Seizures (HCC)   . Stroke [place])     PAST SURGICAL HISTORY:  Past Surgical History:  Procedure Laterality Date  . CHOLECYSTECTOMY    . COLONOSCOPY WITH PROPOFOL N/A 12/31/2016   Procedure: COLONOSCOPY WITH PROPOFOL;  Surgeon: Scot Jun, MD;  Location: [facility] ENDOSCOPY;  Service: Endoscopy;  Laterality: N/A;  . ESOPHAGOGASTRODUODENOSCOPY (EGD) WITH PROPOFOL N/A 12/31/2016   Procedure: ESOPHAGOGASTRODUODENOSCOPY (EGD) WITH PROPOFOL;  Surgeon: Scot Jun, MD;  Location: [facility] ENDOSCOPY;  Service: Endoscopy;  Laterality: N/A;  . TONSILLECTOMY    . TUBAL LIGATION      SOCIAL  HISTORY:  Social History   Tobacco Use  . Smoking status: Never Smoker  . Smokeless tobacco: Never Used  Substance Use Topics  . Alcohol use: No    FAMILY HISTORY:  Family History  Problem Relation Age of Onset  . Prostate cancer Father   . Breast cancer Maternal Aunt   . Breast cancer Maternal Aunt   . Cerebral palsy Son     DRUG ALLERGIES:  Allergies  Allergen Reactions  . Predicort [Prednisolone Acetate]     HIVES,    REVIEW OF SYSTEMS:   CONSTITUTIONAL: No fever, fatigue or weakness.  EYES: No blurred or double vision.  EARS, NOSE, AND THROAT: No tinnitus or ear pain.  RESPIRATORY: No cough, shortness of breath, wheezing or hemoptysis.  CARDIOVASCULAR: No chest pain, orthopnea, edema.  Has palpitations. GASTROINTESTINAL: Has nausea, vomiting, diarrhea and mild abdominal pain.  GENITOURINARY: No dysuria, hematuria.  ENDOCRINE: No polyuria, nocturia,  HEMATOLOGY: No anemia, easy bruising or bleeding SKIN: No rash or lesion. MUSCULOSKELETAL: No joint pain or arthritis.   NEUROLOGIC: No tingling, numbness, weakness.  PSYCHIATRY: No anxiety or depression.   MEDICATIONS AT HOME:  Prior to Admission medications   Medication Sig Start Date End Date Taking? Authorizing Provider  acetaminophen (TYLENOL) 325 MG tablet Take 650 mg by mouth every 6 (six) hours as needed. Thru G tube   Yes [provider]  aspirin 81 MG tablet Take 81  mg by mouth daily.     Yes [provider]  calcium-vitamin D (OSCAL WITH D) 500-200 MG-UNIT tablet Take 1 tablet by mouth 2 (two) times daily.   Yes [provider]  folic acid (FOLVITE) 1 MG tablet Take 1 mg by mouth daily. Per tube once daily     Yes [provider]  guaiFENesin (MUCINEX) 600 MG 12 hr tablet Take 600 mg by mouth 2 (two) times daily.   Yes [provider]  levothyroxine (SYNTHROID, LEVOTHROID) 50 MCG tablet Take 50 mcg by mouth daily before breakfast.   Yes [provider]   Alum & Mag Hydroxide-Simeth (MAGIC MOUTHWASH) SOLN Take by mouth.      [provider]  bisacodyl (DULCOLAX) 10 MG suppository Place 10 mg rectally as needed.      [provider]  Dextromethorphan-Guaifenesin (DIABETIC TUSSIN DM MAX ST PO) Take by mouth. 10ml enteral tub every 6 hours     [provider]  docusate sodium (COLACE) 100 MG capsule Take 100 mg by mouth 2 (two) times daily.    [provider]  famotidine (PEPCID) 20 MG tablet 20 mg daily. Per tube daily     [provider]  Flavoring Agent (CHLORHEXIDINE) CONC Take 15 mLs by mouth. Rinse twice daily     [provider]  lacosamide (VIMPAT) 50 MG TABS tablet Take 100 mg by mouth 2 (two) times daily.    [provider]  Methylcellulose, Laxative, 500 MG TABS Take by mouth daily.    [provider]  metoCLOPramide (REGLAN) 5 MG/5ML solution Take by mouth 4 (four) times daily -  before meals and at bedtime.      [provider]  Nutritional Supplements (OSMOLITE) LIQD Take by mouth. 40 mL per hour     [provider]  Nutritional Supplements (PROSOURCE) LIQD Give 30 mLs by tube 2 (two) times daily.      [provider]  nystatin (MYCOSTATIN) 100000 UNIT/ML suspension 5mL by mouth every 6 hours     [provider]  omeprazole (PRILOSEC) 20 MG capsule Take 20 mg by mouth daily.    [provider]  ondansetron (ZOFRAN) 4 MG tablet Take 4 mg by mouth every 4 (four) hours as needed.      [provider]  potassium chloride 20 MEQ/15ML (10%) solution Take 20 mEq by mouth daily. Thru g tube     [provider]  potassium chloride SA (KLOR-CON M20) 20 MEQ tablet 20 mEq. Per tube daily     [provider]  senna (SENOKOT) 8.6 MG tablet Take 1 tablet by mouth 2 (two) times daily as needed.      [provider]  Skin Protectants, Misc. (EUCERIN) cream Apply topically as needed.      [provider]  thiamine 100 MG tablet Give 100 mg by tube daily.      [provider]      PHYSICAL EXAMINATION:   VITAL SIGNS: Blood pressure 99/67, pulse (!) 127, temperature 98.4 F (36.9 C), temperature source Oral, resp. rate (!) 21, height 5\' 5"  (1.651 m), weight 63.5 kg (140 lb), SpO2 99 %.  GENERAL:  72 y.o.-year-old patient lying in the bed with no acute distress.  EYES: Pupils equal, round, reactive to light and accommodation. No scleral icterus. Extraocular muscles intact.  HEENT: Head atraumatic, normocephalic. Oropharynx dry and nasopharynx clear.  NECK:  Supple, no jugular venous distention. No thyroid enlargement, no  tenderness.  LUNGS: Normal breath sounds bilaterally, no wheezing, rales,rhonchi or crepitation. No use of accessory muscles of respiration.  CARDIOVASCULAR: S1, S2 irregular. No murmurs, rubs, or gallops.  ABDOMEN: Soft, mild tenderness around umbilicus, nondistended. Bowel sounds present. No organomegaly or mass.  EXTREMITIES: No pedal edema, cyanosis, or clubbing.  NEUROLOGIC: Cranial nerves II through XII are intact. Muscle strength 5/5 in all extremities. Sensation intact. Gait not checked.  PSYCHIATRIC: The patient is alert and oriented x 3.  SKIN: No obvious rash, lesion, or ulcer.   LABORATORY PANEL:   CBC Recent Labs  Lab 07/04/17 1829  WBC 14.8*  HGB 15.2  HCT 46.2  PLT 222  MCV 82.1  MCH 27.0  MCHC 32.9  RDW 14.3   ------------------------------------------------------------------------------------------------------------------  Chemistries  Recent Labs  Lab 07/04/17 1829  NA 136  K 4.4  CL 105  CO2 20*  GLUCOSE 161*  BUN 30*  CREATININE 1.43*  CALCIUM 9.3  AST 33  ALT 19  ALKPHOS 72  BILITOT 1.0   ------------------------------------------------------------------------------------------------------------------ estimated creatinine clearance is 31.5 mL/min (A) (by C-G formula based on SCr of 1.43 mg/dL  (H)). ------------------------------------------------------------------------------------------------------------------ No results for input(s): TSH, T4TOTAL, T3FREE, THYROIDAB in the last 72 hours.  Invalid input(s): FREET3   Coagulation profile No results for input(s): INR, PROTIME in the last 168 hours. ------------------------------------------------------------------------------------------------------------------- No results for input(s): DDIMER in the last 72 hours. -------------------------------------------------------------------------------------------------------------------  Cardiac Enzymes No results for input(s): CKMB, TROPONINI, MYOGLOBIN in the last 168 hours.  Invalid input(s): CK ------------------------------------------------------------------------------------------------------------------ Invalid input(s): POCBNP  ---------------------------------------------------------------------------------------------------------------  Urinalysis    Component Value Date/Time   COLORURINE AMBER (A) 07/04/2017 1829   APPEARANCEUR CLOUDY (A) 07/04/2017 1829   APPEARANCEUR Clear 08/29/2011 0037   LABSPEC 1.021 07/04/2017 1829   LABSPEC 1.009 08/29/2011 0037   PHURINE 5.0 07/04/2017 1829   GLUCOSEU NEGATIVE 07/04/2017 1829   GLUCOSEU Negative 08/29/2011 0037   HGBUR NEGATIVE 07/04/2017 1829   BILIRUBINUR SMALL (A) 07/04/2017 1829   BILIRUBINUR Negative 08/29/2011 0037   KETONESUR 5 (A) 07/04/2017 1829   PROTEINUR 100 (A) 07/04/2017 1829   UROBILINOGEN 0.2 01/17/2011 0037   NITRITE NEGATIVE 07/04/2017 1829   LEUKOCYTESUR SMALL (A) 07/04/2017 1829   LEUKOCYTESUR 1+ 08/29/2011 0037     RADIOLOGY: No results found.  EKG: Orders placed or performed during the hospital encounter of 12/31/10  . EKG  . EKG  . EKG    IMPRESSION AND PLAN: 72 year old female patient with history of CVA, hypertension, seizure disorder presented to the emergency room with nausea,  vomiting and diarrhea.  Admitting diagnosis 1.  Acute gastroenteritis 2.  Dehydration 3.  New onset atrial fibrillation 4.  Hypertension 5.  Seizure disorder 6.  History of CVA Treatment plan Admit patient to telemetry Start patient on oral Cardizem for rate control Anticoagulation with oral Eliquis IV fluids Cardiology consultation Echocardiogram Monitor electrolytes  All the records are reviewed and case discussed with ED provider. Management plans discussed with the patient, family and they are in agreement.  CODE STATUS: Full code POA : [name] (son) Code Status History    This patient does not have a recorded code status. Please follow your organizational policy for patients in this situation.    Advance Directive Documentation     Most Recent Value  Type of Advance Directive  Healthcare Power of Attorney  Pre-existing out of facility DNR order (yellow form or pink MOST form)  No data  "MOST" Form in Place?  No data  TOTAL TIME TAKING CARE OF THIS PATIENT: 52 minutes.    Ihor Austin M.D on 07/05/2017 at 12:59 AM  Between 7am to 6pm - Pager - [phone]  After 6pm go to [email or web address] - password [email or web address] [facility]  [place] [facility]  Office  [phone]  CC: Primary care physician; Mickel Fuchs, MD

## 2017-07-05 NOTE — Progress Notes (Signed)
°   07/05/17 1010  Clinical Encounter Type  Visited With Patient  Visit Type Initial  Referral From Nurse  Consult/Referral To Chaplain  Spiritual Encounters  Spiritual Needs Prayer;Emotional   CH received an OR to pray with PT. CH reported to PT's RM and prayed and offered support.

## 2017-07-05 NOTE — Progress Notes (Signed)
[facility] - [place] at [facility]   PATIENT NAME: [name]    MR#:  [id number]  DATE OF BIRTH:  05/23/1946  SUBJECTIVE:  CHIEF COMPLAINT:   Chief Complaint  Patient presents with  . Diarrhea  Patient without complaint, continued watery diarrhea, would like her omeprazole restarted as well as ensure, await cardiology/echocardiogram, check GI panel, start Lactinex, reduce IV fluids, encourage p.o. intake  REVIEW OF SYSTEMS:  CONSTITUTIONAL: No fever, fatigue or weakness.  EYES: No blurred or double vision.  EARS, NOSE, AND THROAT: No tinnitus or ear pain.  RESPIRATORY: No cough, shortness of breath, wheezing or hemoptysis.  CARDIOVASCULAR: No chest pain, orthopnea, edema.  GASTROINTESTINAL: No nausea, vomiting, diarrhea or abdominal pain.  GENITOURINARY: No dysuria, hematuria.  ENDOCRINE: No polyuria, nocturia,  HEMATOLOGY: No anemia, easy bruising or bleeding SKIN: No rash or lesion. MUSCULOSKELETAL: No joint pain or arthritis.   NEUROLOGIC: No tingling, numbness, weakness.  PSYCHIATRY: No anxiety or depression.   ROS  DRUG ALLERGIES:   Allergies  Allergen Reactions  . Predicort [Prednisolone Acetate]     HIVES,    VITALS:  Blood pressure (!) 101/59, pulse 79, temperature 98.2 F (36.8 C), temperature source Oral, resp. rate 17, height 5\' 3"  (1.6 m), weight 60.8 kg (134 lb), SpO2 100 %.  PHYSICAL EXAMINATION:  GENERAL:  72 y.o.-year-old patient lying in the bed with no acute distress.  EYES: Pupils equal, round, reactive to light and accommodation. No scleral icterus. Extraocular muscles intact.  HEENT: Head atraumatic, normocephalic. Oropharynx and nasopharynx clear.  NECK:  Supple, no jugular venous distention. No thyroid enlargement, no tenderness.  LUNGS: Normal breath sounds bilaterally, no wheezing, rales,rhonchi or crepitation. No use of accessory muscles of respiration.  CARDIOVASCULAR: S1, S2 normal. No murmurs, rubs, or gallops.   ABDOMEN: Soft, nontender, nondistended. Bowel sounds present. No organomegaly or mass.  EXTREMITIES: No pedal edema, cyanosis, or clubbing.  NEUROLOGIC: Cranial nerves II through XII are intact. Muscle strength 5/5 in all extremities. Sensation intact. Gait not checked.  PSYCHIATRIC: The patient is alert and oriented x 3.  SKIN: No obvious rash, lesion, or ulcer.   Physical Exam LABORATORY PANEL:   CBC Recent Labs  Lab 07/05/17 0617  WBC 6.9  HGB 13.4  HCT 40.5  PLT 167   ------------------------------------------------------------------------------------------------------------------  Chemistries  Recent Labs  Lab 07/04/17 1829 07/05/17 0617  NA 136 137  K 4.4 3.7  CL 105 110  CO2 20* 19*  GLUCOSE 161* 102*  BUN 30* 30*  CREATININE 1.43* 0.90  CALCIUM 9.3 8.5*  AST 33  --   ALT 19  --   ALKPHOS 72  --   BILITOT 1.0  --    ------------------------------------------------------------------------------------------------------------------  Cardiac Enzymes Recent Labs  Lab 07/05/17 0053 07/05/17 0617  TROPONINI 0.07* 0.07*   ------------------------------------------------------------------------------------------------------------------  RADIOLOGY:  No results found.  ASSESSMENT AND PLAN:  72 year old female patient with history of CVA, hypertension, seizure disorder presented to the emergency room with nausea, vomiting and diarrhea.  1.  Acute gastroenteritis 2.  Dehydration 3.  New onset atrial fibrillation 4.  Hypertension 5.  Seizure disorder 6.  History of CVA Treatment plan Admit patient to telemetry Start patient on oral Cardizem for rate control Anticoagulation with oral Eliquis IV fluids Cardiology consultation Echocardiogram Monitor electrolytes  Agree with plan of care per above  POSSIBLE Lauren/C IN 1-2 DAYS, DEPENDING ON CLINICAL CONDITION.   [name] [name] M.Lauren on 07/05/2017   Between 7am to 6pm - Pager -  [phone]  After 6pm  go to [email or web address] - password [email or web address]  [facility] [place] [facility]  Office  [phone]  CC: Primary care physician; Mickel Fuchs, MD  Note: This dictation was prepared with Dragon dictation along with smaller phrase technology. Any transcriptional errors that result from this process are unintentional.

## 2017-07-05 NOTE — Progress Notes (Signed)
Pt's stool speec pos for norovirus. iso-enteric precautions initiated. Dr. Katheren ShamsSalary notified.

## 2017-07-05 NOTE — Progress Notes (Signed)
Notified Dr.Pyreddy about elevated heart rate and elevated troponin level. Patient did not receive oral dose of cardizem in the emergency room. Dr.Pyreddy gave orders to administer oral dose now.

## 2017-07-05 NOTE — ED Notes (Signed)
Pt transported to room 252. 

## 2017-07-06 ENCOUNTER — Inpatient Hospital Stay
Admit: 2017-07-06 | Discharge: 2017-07-06 | Disposition: A | Discharge status: Home / Self Care | Payer: Medicare Other | Attending: Family Medicine | Admitting: Family Medicine

## 2017-07-06 MED ORDER — DILTIAZEM HCL ER COATED BEADS 120 MG PO CP24
120.0000 mg | ORAL_CAPSULE | Freq: Every day | ORAL | Status: DC
  Administered 2017-07-06 – 2017-07-07 (×2): 120 mg via ORAL
  Filled 2017-07-06: qty 1

## 2017-07-06 MED ORDER — DILTIAZEM HCL 60 MG PO TABS
30.0000 mg | ORAL_TABLET | Freq: Four times a day (QID) | ORAL | Status: DC
  Filled 2017-07-06: qty 0.5

## 2017-07-06 MED ORDER — SODIUM CHLORIDE 0.9% FLUSH
3.0000 mL | Freq: Two times a day (BID) | INTRAVENOUS | Status: DC
  Administered 2017-07-06 (×2): 3 mL via INTRAVENOUS

## 2017-07-06 MED ORDER — DILTIAZEM HCL 30 MG PO TABS
30.0000 mg | ORAL_TABLET | Freq: Four times a day (QID) | ORAL | Status: DC
  Administered 2017-07-06: 30 mg via ORAL
  Filled 2017-07-06: qty 1

## 2017-07-06 NOTE — Progress Notes (Signed)
[facility]  SUBJECTIVE: Patient laying in bed, reports feeling much better, denies chest pain, shortness of breath, or palpitations   Vitals:   07/05/17 1623 07/05/17 2003 07/06/17 0633 07/06/17 0806  BP: 121/62 126/73 126/60 116/71  Pulse: 88 96 75 78  Resp: 12 18 18 12   Temp: 99.2 F (37.3 C) 98.7 F (37.1 C) 98.6 F (37 C) (!) 97.5 F (36.4 C)  TempSrc: Oral Oral Oral Oral  SpO2: 97% 95% 95% 97%  Weight:      Height:         Intake/Output Summary (Last 24 hours) at 07/06/2017 0818 Last data filed at 07/06/2017 0203 Gross per 24 hour  Intake 360 ml  Output 1150 ml  Net -790 ml      PHYSICAL EXAM  General: Well developed, well nourished, in no acute distress HEENT:  Normocephalic and atramatic Neck:  No JVD.  Lungs: Clear bilaterally to auscultation and percussion. Heart: HRRR . Normal S1 and S2 without gallops or murmurs.  Abdomen: Bowel sounds are positive, abdomen soft and non-tender  Msk:  Back normal, normal gait. Normal strength and tone for age. Extremities: No clubbing, cyanosis or edema.   Neuro: Alert and oriented X 3. Psych:  Good affect, responds appropriately   LABS: Basic Metabolic Panel: Recent Labs    07/04/17 1829 07/05/17 0617  NA 136 137  K 4.4 3.7  CL 105 110  CO2 20* 19*  GLUCOSE 161* 102*  BUN 30* 30*  CREATININE 1.43* 0.90  CALCIUM 9.3 8.5*   Liver Function Tests: Recent Labs    07/04/17 1829  AST 33  ALT 19  ALKPHOS 72  BILITOT 1.0  PROT 8.2*  ALBUMIN 4.4   Recent Labs    07/04/17 1829  LIPASE 23   CBC: Recent Labs    07/04/17 1829 07/05/17 0617  WBC 14.8* 6.9  HGB 15.2 13.4  HCT 46.2 40.5  MCV 82.1 81.9  PLT 222 167   Cardiac Enzymes: Recent Labs    07/05/17 0053 07/05/17 0617 07/05/17 1216  TROPONINI 0.07* 0.07* 0.07*   BNP: Invalid input(s): POCBNP D-Dimer: No results for input(s): DDIMER in the last 72 hours. Hemoglobin A1C: No results for input(s): HGBA1C in the last 72 hours. Fasting  Lipid Panel: No results for input(s): CHOL, HDL, LDLCALC, TRIG, CHOLHDL, LDLDIRECT in the last 72 hours. Thyroid Function Tests: No results for input(s): TSH, T4TOTAL, T3FREE, THYROIDAB in the last 72 hours.  Invalid input(s): FREET3 Anemia Panel: No results for input(s): VITAMINB12, FOLATE, FERRITIN, TIBC, IRON, RETICCTPCT in the last 72 hours.  No results found.   Echo pending  TELEMETRY: Normal sinus rhythm:  ASSESSMENT AND PLAN:  Active Problems:   Acute gastroenteritis    1.  Atrial fibrillation with rapid ventricular rate, secondary to diarrhea, nausea, vomiting and dehydration, converted to sinus rhythm, on low-dose diltiazem 30 mg p.o. every 6 hours 2.  Borderline elevated troponin, without peak or trough, likely demand supply ischemia, secondary to atrial fibrillation with a rapid ventricular rate, not due to acute coronary syndrome 3.  Diarrhea/nausea/vomiting, likely acute viral gastroenteritis, improved  Recommendations  1.  Agree with overall current therapy 2.  Defer chronic anticoagulation for atrial fibrillation which occurred secondary to probable reversible cause 3.  Change diltiazem 30 mg p.o. every 6 to Cardizem CD 120 mg daily 4.  Review 2D echocardiogram  Sign off for now, please call if any questions   Marcina MillardAlexander November Sypher, MD, PhD, [facility] Center[facility]ACC 07/06/2017 8:18 AM

## 2017-07-06 NOTE — Evaluation (Signed)
Physical Therapy Evaluation Patient Details Name: [name] MRN: [id number] DOB: 12/30/1945 Today's Date: 07/06/2017   History of Present Illness  Pt admitted for acute gastroenteritis. Pt with complaints of diarrhea and N/V. History includes HTN, seizures, and CVA. Pt initially dizzy with mobility efforts, asked by RN to obtain orthostatic vital signs.  Clinical Impression  Pt is a pleasant 72 year old female who was admitted for acute gastroenteritis. Pt reports feeling much better today, no dizziness noted. Obtained vital signs; supine: 110/64 HR 71; seated: 117/73 HR 78; standing: 118/76 HR 83. RN notified. Pt performs bed mobility independently and transfers/ambulation with  Mod I using SPC at baseline, used IV pole this date. Safe technique with mobility efforts in room. Pt reports she has already ambulated in hallway without difficulty. [facility] and participate in her activities. Pt is currently at baseline level. Pt does not require any further PT needs at this time. Pt will be dc in house and does not require follow up. RN aware. Will dc current orders.      Follow Up Recommendations No PT follow up    Equipment Recommendations  None recommended by PT    Recommendations for Other Services       Precautions / Restrictions Precautions Precautions: None Restrictions Weight Bearing Restrictions: No      Mobility  Bed Mobility Overal bed mobility: Independent             General bed mobility comments: safe technique  Transfers Overall transfer level: Modified independent Equipment used: None             General transfer comment: able to push from seated surface. once standing, uses IV pole for assistance. Safe technique  Ambulation/Gait Ambulation/Gait assistance: Modified independent (Device/Increase time) Ambulation Distance (Feet): 40 Feet Assistive device: None Gait Pattern/deviations: WFL(Within  Functional Limits)     General Gait Details: reports she has already ambulated 2 laps with RN staff in hallway. Pt currently on enteric isolation, kept ambulation in room using IV pole with mod I. Safe technique performed with pt able to navigate obstacles. No LOB noted. No reports of dizziness, orthostatics negative  Stairs            Wheelchair Mobility    Modified Rankin (Stroke Patients Only)       Balance Overall balance assessment: Modified Independent                                           Pertinent Vitals/Pain Pain Assessment: No/denies pain    Home Living Family/patient expects to be discharged to:: Private residence Living Arrangements: Alone Available Help at Discharge: Family;Available PRN/intermittently Type of Home: Apartment Home Access: Level entry     Home Layout: One level Home Equipment: Cane - single point      Prior Function Level of Independence: Independent with assistive device(s)         Comments: uses SPC for all mobility. Reports no falls     Hand Dominance   Dominant Hand: Right    Extremity/Trunk Assessment   Upper Extremity Assessment Upper Extremity Assessment: Overall WFL for tasks assessed    Lower Extremity Assessment Lower Extremity Assessment: Overall WFL for tasks assessed       Communication   Communication: No difficulties  Cognition Arousal/Alertness: Awake/alert Behavior During Therapy: WFL for  tasks assessed/performed Overall Cognitive Status: Within Functional Limits for tasks assessed                                        General Comments      Exercises     Assessment/Plan    PT Assessment Patent does not need any further PT services  PT Problem List         PT Treatment Interventions      PT Goals (Current goals can be found in the Care Plan section)  Acute Rehab PT Goals Patient Stated Goal: to feel better PT Goal Formulation: All assessment and  education complete, DC therapy Time For Goal Achievement: 07/06/17 Potential to Achieve Goals: Good    Frequency     Barriers to discharge        Co-evaluation               AM-PAC PT "6 Clicks" Daily Activity  Outcome Measure Difficulty turning over in bed (including adjusting bedclothes, sheets and blankets)?: None Difficulty moving from lying on back to sitting on the side of the bed? : None Difficulty sitting down on and standing up from a chair with arms (e.g., wheelchair, bedside commode, etc,.)?: None Help needed moving to and from a bed to chair (including a wheelchair)?: None Help needed walking in hospital room?: None Help needed climbing 3-5 steps with a railing? : A Little 6 Click Score: 23    End of Session   Activity Tolerance: Patient tolerated treatment well Patient left: in chair Nurse Communication: Mobility status PT Visit Diagnosis: Unsteadiness on feet (R26.81)    Time: [id number] PT Time Calculation (min) (ACUTE ONLY): 22 min   Charges:   PT Evaluation $PT Eval Low Complexity: 1 Low PT Treatments $Therapeutic Activity: 8-22 mins   PT G CodesElizabeth Palau:        Beau Ramsburg, PT, DPT [phone]   Lila Lufkin 07/06/2017, 4:37 PM

## 2017-07-06 NOTE — Progress Notes (Signed)
*  PRELIMINARY RESULTS* Echocardiogram 2D Echocardiogram has been performed.  Cristela BlueHege, Demani Mcbrien 07/06/2017, 3:27 PM

## 2017-07-06 NOTE — Progress Notes (Signed)
No stools this shift.  No c/o abdominal distress.

## 2017-07-06 NOTE — Progress Notes (Signed)
[facility] [place] at [facility]   PATIENT NAME: [name]    MR#:  [id number]  DATE OF BIRTH:  09/10/1945  SUBJECTIVE:  CHIEF COMPLAINT:   Chief Complaint  Patient presents with  . Diarrhea  Patient complains of lightheadedness/dizziness/inability to ambulate, does not feel well, for echocardiogram, cardiology input appreciated, patient states that she does not take Vimpat procedures-patient states that she was taken off seizure medications long ago  REVIEW OF SYSTEMS:  CONSTITUTIONAL: No fever, fatigue or weakness.  EYES: No blurred or double vision.  EARS, NOSE, AND THROAT: No tinnitus or ear pain.  RESPIRATORY: No cough, shortness of breath, wheezing or hemoptysis.  CARDIOVASCULAR: No chest pain, orthopnea, edema.  GASTROINTESTINAL: No nausea, vomiting, diarrhea or abdominal pain.  GENITOURINARY: No dysuria, hematuria.  ENDOCRINE: No polyuria, nocturia,  HEMATOLOGY: No anemia, easy bruising or bleeding SKIN: No rash or lesion. MUSCULOSKELETAL: No joint pain or arthritis.   NEUROLOGIC: No tingling, numbness, weakness.  PSYCHIATRY: No anxiety or depression.   ROS  DRUG ALLERGIES:   Allergies  Allergen Reactions  . Predicort [Prednisolone Acetate]     HIVES,    VITALS:  Blood pressure 116/71, pulse 78, temperature (!) 97.5 F (36.4 C), temperature source Oral, resp. rate 12, height 5\' 3"  (1.6 m), weight 60.8 kg (134 lb), SpO2 97 %.  PHYSICAL EXAMINATION:  GENERAL:  72 y.o.-year-old patient lying in the bed with no acute distress.  EYES: Pupils equal, round, reactive to light and accommodation. No scleral icterus. Extraocular muscles intact.  HEENT: Head atraumatic, normocephalic. Oropharynx and nasopharynx clear.  NECK:  Supple, no jugular venous distention. No thyroid enlargement, no tenderness.  LUNGS: Normal breath sounds bilaterally, no wheezing, rales,rhonchi or crepitation. No use of accessory muscles of respiration.  CARDIOVASCULAR: S1,  S2 normal. No murmurs, rubs, or gallops.  ABDOMEN: Soft, nontender, nondistended. Bowel sounds present. No organomegaly or mass.  EXTREMITIES: No pedal edema, cyanosis, or clubbing.  NEUROLOGIC: Cranial nerves II through XII are intact. Muscle strength 5/5 in all extremities. Sensation intact. Gait not checked.  PSYCHIATRIC: The patient is alert and oriented x 3.  SKIN: No obvious rash, lesion, or ulcer.   Physical Exam LABORATORY PANEL:   CBC Recent Labs  Lab 07/05/17 0617  WBC 6.9  HGB 13.4  HCT 40.5  PLT 167   ------------------------------------------------------------------------------------------------------------------  Chemistries  Recent Labs  Lab 07/04/17 1829 07/05/17 0617  NA 136 137  K 4.4 3.7  CL 105 110  CO2 20* 19*  GLUCOSE 161* 102*  BUN 30* 30*  CREATININE 1.43* 0.90  CALCIUM 9.3 8.5*  AST 33  --   ALT 19  --   ALKPHOS 72  --   BILITOT 1.0  --    ------------------------------------------------------------------------------------------------------------------  Cardiac Enzymes Recent Labs  Lab 07/05/17 0617 07/05/17 1216  TROPONINI 0.07* 0.07*   ------------------------------------------------------------------------------------------------------------------  RADIOLOGY:  No results found.  ASSESSMENT AND PLAN:  72 year old female patient with history of CVA, hypertension, seizure disorder presented to the emergency room with nausea, vomiting and diarrhea.  1 acute Norovirus infection Resolving Continue IV fluids for gentle rehydration, encourage p.o. intake, vitals per routine, check orthostatics, ambulate in halls with assistance, physical therapy to evaluate/treat, strict I&O monitoring, and tentative discharge to home in the next 1-2 days patient continues to do well  2 acute new onset A. Fib Stable Cardiology input greatly appreciated-for echocardiogram for further evaluation, Cardizem changed to 120 mg p.o. daily, continue  telemetry, anticoagulation was deferred probable reversible cause  3 acute dehydration Resolved with IV fluids for rehydration  4 chronic benign essential hypertension  Stable on current regiment   5 history of seizure disorder  Patient states that she was taken off Vimpat medication we will go-we will discontinue   6 history of cerebrovascular accident  Stable on current regiment  All the records are reviewed and case discussed with Care Management/Social Workerr. Management plans discussed with the patient, family and they are in agreement.  CODE STATUS: full  TOTAL TIME TAKING CARE OF THIS PATIENT: 35 minutes.     POSSIBLE D/C IN 1-2 DAYS, DEPENDING ON CLINICAL CONDITION.   [name] M.D on 07/06/2017   Between 7am to 6pm - Pager - [phone]  After 6pm go to [email or web address] - password [email or web address]  [facility]  Office  [phone]  CC: Primary care physician; Mickel Fuchs, MD  Note: This dictation was prepared with Dragon dictation along with smaller phrase technology. Any transcriptional errors that result from this process are unintentional.

## 2017-07-06 NOTE — Progress Notes (Signed)
Patient is refusing the ensure supplements.  She thinks it makes her diarrhea worse.

## 2017-07-06 NOTE — Care Management (Signed)
Patient lives in an apartment complex and is independent in all her adls.  Has a son that lives locally and her mother is currently in a local SNF after having a stroke.  Patient uses can for ambulation. Travel via [employer] to the [facility] daily.  No issues accessing medical care, paying for meds or with transportation.  Discussed that anticipate discharge within the next 24 hours

## 2017-07-07 MED ORDER — DILTIAZEM HCL ER COATED BEADS 120 MG PO CP24: 120.0000 mg | ORAL_CAPSULE | Freq: Every day | ORAL | 0 refills | Status: DC

## 2017-07-07 NOTE — Discharge Summary (Signed)
[facility] [place] at [facility]   PATIENT NAME: [name]    MR#:  [id number]  DATE OF BIRTH:  12-17-1945  DATE OF ADMISSION:  07/04/2017 ADMITTING PHYSICIAN: Ihor Austin, MD  DATE OF DISCHARGE: No discharge date for patient encounter.  PRIMARY CARE PHYSICIAN: Mickel Fuchs, MD    ADMISSION DIAGNOSIS:  Dehydration [E86.0] Paroxysmal atrial fibrillation (HCC) [I48.0] Diarrhea in adult patient [R19.7] Acute kidney injury (HCC) [N17.9]  DISCHARGE DIAGNOSIS:  Active Problems:   Acute gastroenteritis   SECONDARY DIAGNOSIS:   Past Medical History:  Diagnosis Date  . Acute respiratory failure (HCC)    Secondary to aspiration pneumonia   . Altered mental status    Secondary to viral herpes simplex virus encephalitis  . Anemia   . Bilateral pneumonia   . Dysphasia   . Encephalitis   . Hyperglycemia   . Hypertension   . Hyponatremia   . Seizures (HCC)   . Stroke [facility])     HOSPITAL COURSE:  72 year old female patient with history of CVA, hypertension, seizure disorder presented to the emergency room with nausea, vomiting and diarrhea.  1 acute Norovirus infection Resolving Treated with IVFs rehydration, patient tolerated a diet, evaluated by physical therapy-no needs identified, and patient did well   2 acute new onset A. Fib Stable Cardiology did see patient while in house- echocardiogram done but was not read by the time of this dictation-patient will need to follow-up with cardiology status post discharge for continued care/evaluation, patient started on Cardizem changed to 120 mg p.o. daily, anticoagulation was deferred probable reversible cause   3 acute dehydration Resolved with IV fluids for rehydration  4 chronic benign essential hypertension  Stable on current regiment   5 history of seizure disorder  Patient stated that she was taken off Vimpat medication long go, so it was discontinued  6 history of cerebrovascular  accident  Stable on current regiment   DISCHARGE CONDITIONS:  On day of discharge patient is afebrile, hemogram stable, tolerating diet, ready for discharge home with appropriate follow-up with primary care provider in 3-5 days for reevaluation, for more specific details please see chart  CONSULTS OBTAINED:  Treatment Team:  Marcina Millard, MD  DRUG ALLERGIES:   Allergies  Allergen Reactions  . Predicort [Prednisolone Acetate]     HIVES,    DISCHARGE MEDICATIONS:   Allergies as of 07/07/2017      Reactions   Predicort [prednisolone Acetate]    HIVES,      Medication List    TAKE these medications   acetaminophen 325 MG tablet Commonly known as:  TYLENOL Take 650 mg by mouth every 6 (six) hours as needed. Thru G tube   aspirin 81 MG tablet Take 81 mg by mouth daily.   calcium-vitamin D 500-200 MG-UNIT tablet Commonly known as:  OSCAL WITH D Take 1 tablet by mouth 2 (two) times daily.   calcium-vitamin D 500-200 MG-UNIT tablet Take 1 tablet by mouth 2 (two) times daily.   diltiazem 120 MG 24 hr capsule Commonly known as:  CARDIZEM CD Take 1 capsule (120 mg total) by mouth daily. Start taking on:  07/08/2017   docusate sodium 100 MG capsule Commonly known as:  COLACE Take 100 mg by mouth 2 (two) times daily.   eucerin cream Apply topically as needed.   folic acid 1 MG tablet Commonly known as:  FOLVITE Take 1 mg by mouth daily. Per tube once daily   guaiFENesin 600 MG 12  hr tablet Commonly known as:  MUCINEX Take 600 mg by mouth 2 (two) times daily.   levothyroxine 50 MCG tablet Commonly known as:  SYNTHROID, LEVOTHROID Take 50 mcg by mouth daily before breakfast.   omeprazole 20 MG capsule Commonly known as:  PRILOSEC Take 20 mg by mouth daily.   OSMOLITE Liqd Take by mouth. 40 mL per hour        DISCHARGE INSTRUCTIONS:    If you experience worsening of your admission symptoms, develop shortness of breath, life threatening emergency,  suicidal or homicidal thoughts you must seek medical attention immediately by calling 911 or calling your MD immediately  if symptoms less severe.  You Must read complete instructions/literature along with all the possible adverse reactions/side effects for all the Medicines you take and that have been prescribed to you. Take any new Medicines after you have completely understood and accept all the possible adverse reactions/side effects.   Please note  You were cared for by a hospitalist during your hospital stay. If you have any questions about your discharge medications or the care you received while you were in the hospital after you are discharged, you can call the unit and asked to speak with the hospitalist on call if the hospitalist that took care of you is not available. Once you are discharged, your primary care physician will handle any further medical issues. Please note that NO REFILLS for any discharge medications will be authorized once you are discharged, as it is imperative that you return to your primary care physician (or establish a relationship with a primary care physician if you do not have one) for your aftercare needs so that they can reassess your need for medications and monitor your lab values.    Today   CHIEF COMPLAINT:   Chief Complaint  Patient presents with  . Diarrhea    HISTORY OF PRESENT ILLNESS:  72 y.o. female with a known history of hypertension, seizure disorder, CVA presented to the emergency room with nausea and vomiting and diarrhea.  Patient had 2 episodes of vomiting on and off day ago.  She also had an episode of diarrhea which was watery stool.  Patient ate some lasagna 2 days ago which was procured from outside restaurant.  No blood in the stool.  Diarrhea is improved.  Patient was found to have atrial fibrillation when she presented to the emergency room.  No complaints of any chest pain, shortness of breath.  Has some palpitations on and off.   Hospitalist service was consulted.     VITAL SIGNS:  Blood pressure 117/62, pulse 88, temperature 98.1 F (36.7 C), temperature source Oral, resp. rate 18, height 5\' 3"  (1.6 m), weight 61 kg (134 lb 8 oz), SpO2 99 %.  I/O:    Intake/Output Summary (Last 24 hours) at 07/07/2017 1000 Last data filed at 07/07/2017 0500 Gross per 24 hour  Intake -  Output 2000 ml  Net -2000 ml    PHYSICAL EXAMINATION:  GENERAL:  72 y.o.-year-old patient lying in the bed with no acute distress.  EYES: Pupils equal, round, reactive to light and accommodation. No scleral icterus. Extraocular muscles intact.  HEENT: Head atraumatic, normocephalic. Oropharynx and nasopharynx clear.  NECK:  Supple, no jugular venous distention. No thyroid enlargement, no tenderness.  LUNGS: Normal breath sounds bilaterally, no wheezing, rales,rhonchi or crepitation. No use of accessory muscles of respiration.  CARDIOVASCULAR: S1, S2 normal. No murmurs, rubs, or gallops.  ABDOMEN: Soft, non-tender, non-distended. Bowel sounds present.  No organomegaly or mass.  EXTREMITIES: No pedal edema, cyanosis, or clubbing.  NEUROLOGIC: Cranial nerves II through XII are intact. Muscle strength 5/5 in all extremities. Sensation intact. Gait not checked.  PSYCHIATRIC: The patient is alert and oriented x 3.  SKIN: No obvious rash, lesion, or ulcer.   DATA REVIEW:   CBC Recent Labs  Lab 07/05/17 0617  WBC 6.9  HGB 13.4  HCT 40.5  PLT 167    Chemistries  Recent Labs  Lab 07/04/17 1829 07/05/17 0617  NA 136 137  K 4.4 3.7  CL 105 110  CO2 20* 19*  GLUCOSE 161* 102*  BUN 30* 30*  CREATININE 1.43* 0.90  CALCIUM 9.3 8.5*  AST 33  --   ALT 19  --   ALKPHOS 72  --   BILITOT 1.0  --     Cardiac Enzymes Recent Labs  Lab 07/05/17 1216  TROPONINI 0.07*    Microbiology Results  Results for orders placed or performed during the hospital encounter of 07/04/17  Gastrointestinal Panel by PCR , Stool     Status: Abnormal    Collection Time: 07/05/17 12:10 PM  Result Value Ref Range Status   Campylobacter species NOT DETECTED NOT DETECTED Final   Plesimonas shigelloides NOT DETECTED NOT DETECTED Final   Salmonella species NOT DETECTED NOT DETECTED Final   Yersinia enterocolitica NOT DETECTED NOT DETECTED Final   Vibrio species NOT DETECTED NOT DETECTED Final   Vibrio cholerae NOT DETECTED NOT DETECTED Final   Enteroaggregative E coli (EAEC) NOT DETECTED NOT DETECTED Final   Enteropathogenic E coli (EPEC) NOT DETECTED NOT DETECTED Final   Enterotoxigenic E coli (ETEC) NOT DETECTED NOT DETECTED Final   Shiga like toxin producing E coli (STEC) NOT DETECTED NOT DETECTED Final   Shigella/Enteroinvasive E coli (EIEC) NOT DETECTED NOT DETECTED Final   Cryptosporidium NOT DETECTED NOT DETECTED Final   Cyclospora cayetanensis NOT DETECTED NOT DETECTED Final   Entamoeba histolytica NOT DETECTED NOT DETECTED Final   Giardia lamblia NOT DETECTED NOT DETECTED Final   Adenovirus F40/41 NOT DETECTED NOT DETECTED Final   Astrovirus NOT DETECTED NOT DETECTED Final   Norovirus GI/GII DETECTED (A) NOT DETECTED Final    Comment: RESULT CALLED TO, READ BACK BY AND VERIFIED WITH: SUSAN PRESTO 07/05/17 @ 1613  MLK    Rotavirus A NOT DETECTED NOT DETECTED Final   Sapovirus (I, II, IV, and V) NOT DETECTED NOT DETECTED Final    Comment: Performed at [facility], [address] [place], [place] [address]    RADIOLOGY:  No results found.  EKG:   Orders placed or performed during the hospital encounter of 12/31/10  . EKG  . EKG  . EKG      Management plans discussed with the patient, family and they are in agreement.  CODE STATUS:     Code Status Orders  (From admission, onward)        Start     Ordered   07/05/17 0148  Full code  Continuous     07/05/17 0148    Code Status History    Date Active Date Inactive Code Status Order ID Comments User Context   This patient has a current code status but no  historical code status.    Advance Directive Documentation     Most Recent Value  Type of Advance Directive  Healthcare Power of Attorney  Pre-existing out of facility DNR order (yellow form or pink MOST form)  No data  "  MOST" Form in Place?  No data      TOTAL TIME TAKING CARE OF THIS PATIENT: 45 minutes.    Evelena Asa Ayan Yankey M.D on 07/07/2017 at 10:00 AM  Between 7am to 6pm - Pager - [phone]  After 6pm go to [email or web address] - password [email or web address]  [facility]  Office  [phone]  CC: Primary care physician; Mickel Fuchs, MD   Note: This dictation was prepared with Dragon dictation along with smaller phrase technology. Any transcriptional errors that result from this process are unintentional.

## 2017-07-07 NOTE — Progress Notes (Signed)
Discharge instructions explained to pt/ verbalized an understanding/ iv and tele removed/ will transport off unit via wheelchair.  

## 2017-07-07 NOTE — Care Management Important Message (Signed)
Important Message  Patient Details  Name: [name] MRN: [id number] Date of Birth: 03/15/1946   Medicare Important Message Given:  Yes  Signed IM notice given   Eber HongGreene, Kadance Mccuistion R, RN 07/07/2017, 9:21 AM

## 2017-07-10 LAB — ECHOCARDIOGRAM COMPLETE
Height: 63 in
WEIGHTICAEL: 2144 [oz_av]

## 2017-07-15 ENCOUNTER — Other Ambulatory Visit: Payer: Self-pay | Admitting: Family Medicine

## 2017-07-15 DIAGNOSIS — Z1231 Encounter for screening mammogram for malignant neoplasm of breast: Secondary | ICD-10-CM

## 2017-08-25 ENCOUNTER — Ambulatory Visit
Admission: RE | Admit: 2017-08-25 | Discharge: 2017-08-25 | Disposition: A | Discharge status: Home / Self Care | Payer: Medicare Other | Source: Ambulatory Visit | Attending: Family Medicine | Admitting: Family Medicine

## 2017-08-25 DIAGNOSIS — Z1231 Encounter for screening mammogram for malignant neoplasm of breast: Secondary | ICD-10-CM | POA: Diagnosis not present

## 2018-01-13 ENCOUNTER — Encounter: Payer: Self-pay | Admitting: Emergency Medicine

## 2018-01-13 ENCOUNTER — Other Ambulatory Visit: Payer: Self-pay

## 2018-01-13 ENCOUNTER — Emergency Department
Admission: EM | Admit: 2018-01-13 | Discharge: 2018-01-13 | Disposition: A | Discharge status: Home / Self Care | Payer: Medicare Other | Attending: Student in an Organized Health Care Education/Training Program | Admitting: Student in an Organized Health Care Education/Training Program

## 2018-01-13 ENCOUNTER — Emergency Department: Payer: Medicare Other

## 2018-01-13 DIAGNOSIS — Z8673 Personal history of transient ischemic attack (TIA), and cerebral infarction without residual deficits: Secondary | ICD-10-CM | POA: Insufficient documentation

## 2018-01-13 DIAGNOSIS — Z79899 Other long term (current) drug therapy: Secondary | ICD-10-CM | POA: Diagnosis not present

## 2018-01-13 DIAGNOSIS — I10 Essential (primary) hypertension: Secondary | ICD-10-CM | POA: Diagnosis not present

## 2018-01-13 DIAGNOSIS — R42 Dizziness and giddiness: Secondary | ICD-10-CM | POA: Diagnosis not present

## 2018-01-13 LAB — BASIC METABOLIC PANEL
ANION GAP: 11 (ref 5–15)
BUN: 15 mg/dL (ref 8–23)
CALCIUM: 8.8 mg/dL — AB (ref 8.9–10.3)
CO2: 24 mmol/L (ref 22–32)
CREATININE: 0.83 mg/dL (ref 0.44–1.00)
Chloride: 101 mmol/L (ref 98–111)
Glucose, Bld: 99 mg/dL (ref 70–99)
Potassium: 3.9 mmol/L (ref 3.5–5.1)
SODIUM: 136 mmol/L (ref 135–145)

## 2018-01-13 LAB — URINALYSIS, COMPLETE (UACMP) WITH MICROSCOPIC
BACTERIA UA: NONE SEEN
BILIRUBIN URINE: NEGATIVE
Glucose, UA: NEGATIVE mg/dL
KETONES UR: NEGATIVE mg/dL
Leukocytes, UA: NEGATIVE
NITRITE: NEGATIVE
PH: 6 (ref 5.0–8.0)
Protein, ur: NEGATIVE mg/dL
SPECIFIC GRAVITY, URINE: 1.002 — AB (ref 1.005–1.030)

## 2018-01-13 LAB — CBC
HCT: 38.4 % (ref 35.0–47.0)
HEMOGLOBIN: 13.1 g/dL (ref 12.0–16.0)
MCH: 28 pg (ref 26.0–34.0)
MCHC: 34.1 g/dL (ref 32.0–36.0)
MCV: 82.3 fL (ref 80.0–100.0)
PLATELETS: 194 10*3/uL (ref 150–440)
RBC: 4.67 MIL/uL (ref 3.80–5.20)
RDW: 14.7 % — ABNORMAL HIGH (ref 11.5–14.5)
WBC: 7 10*3/uL (ref 3.6–11.0)

## 2018-01-13 MED ORDER — MECLIZINE HCL 25 MG PO TABS
12.5000 mg | ORAL_TABLET | Freq: Once | ORAL | Status: AC
  Administered 2018-01-13: 12.5 mg via ORAL
  Filled 2018-01-13: qty 1

## 2018-01-13 MED ORDER — MECLIZINE HCL 12.5 MG PO TABS: 12.5000 mg | ORAL_TABLET | Freq: Two times a day (BID) | ORAL | 0 refills | Status: DC | PRN

## 2018-01-13 MED ORDER — SODIUM CHLORIDE 0.9 % IV BOLUS
250.0000 mL | Freq: Once | INTRAVENOUS | Status: AC
  Administered 2018-01-13: 250 mL via INTRAVENOUS

## 2018-01-13 NOTE — ED Triage Notes (Signed)
Pt via ems from home with dizziness x 1 week. She saw her pcp and was taken off metoprolol to see if that was the cause. She states she is still dizzy. Pt alert & oriented with NAD noted.

## 2018-01-13 NOTE — ED Provider Notes (Signed)
[facility] Emergency Department Provider Note    First MD Initiated Contact with Patient 01/13/18 1735     (approximate)  I have reviewed the triage vital signs and the nursing notes.   HISTORY  Chief Complaint Dizziness    HPI [name] is a 72 y.o. female extensive past medical history presents the ER with over 1 week of generalized dizziness worse with movement..  No change when standing up with her feet she looks down real quickly or turns her head to the side that worsens her dizziness.  They recently stopped her metoprolol that she is on for history of A. fib and flutter without any improvement.  Denies any headaches with this.  No numbness or tingling.  No fevers.  Denies any chest pain or palpitations.  Denies any history of vertigo.    Past Medical History:  Diagnosis Date  . Acute respiratory failure (HCC)    Secondary to aspiration pneumonia   . Altered mental status    Secondary to viral herpes simplex virus encephalitis  . Anemia   . Bilateral pneumonia   . Dysphasia   . Encephalitis   . Hyperglycemia   . Hypertension   . Hyponatremia   . Seizures (HCC)   . Stroke [facility](HCC)    Family History  Problem Relation Age of Onset  . Prostate cancer Father   . Breast cancer Maternal Aunt   . Breast cancer Maternal Aunt   . Cerebral palsy Son    Past Surgical History:  Procedure Laterality Date  . CHOLECYSTECTOMY    . COLONOSCOPY WITH PROPOFOL N/A 12/31/2016   Procedure: COLONOSCOPY WITH PROPOFOL;  Surgeon: Scot JunElliott, Robert T, MD;  [facility];  Service: Endoscopy;  Laterality: N/A;  . ESOPHAGOGASTRODUODENOSCOPY (EGD) WITH PROPOFOL N/A 12/31/2016   Procedure: ESOPHAGOGASTRODUODENOSCOPY (EGD) WITH PROPOFOL;  Surgeon: Scot JunElliott, Robert T, MD;  [facility] ENDOSCOPY;  Service: Endoscopy;  Laterality: N/A;  . TONSILLECTOMY    . TUBAL LIGATION     Patient Active Problem List   Diagnosis Date Noted  . Acute gastroenteritis  07/05/2017  . Acute respiratory failure (HCC) 03/27/2011      Prior to Admission medications   Medication Sig Start Date End Date Taking? Authorizing Provider  acetaminophen (TYLENOL) 325 MG tablet Take 650 mg by mouth every 6 (six) hours as needed. Thru G tube    [provider]  aspirin 81 MG tablet Take 81 mg by mouth daily.      [provider]  Calcium Carb-Cholecalciferol (CALCIUM-VITAMIN D) 500-200 MG-UNIT tablet Take 1 tablet by mouth 2 (two) times daily.    [provider]  calcium-vitamin D (OSCAL WITH D) 500-200 MG-UNIT tablet Take 1 tablet by mouth 2 (two) times daily.    [provider]  diltiazem (CARDIZEM CD) 120 MG 24 hr capsule Take 1 capsule (120 mg total) by mouth daily. 07/08/17   Salary, Evelena AsaMontell D, MD  docusate sodium (COLACE) 100 MG capsule Take 100 mg by mouth 2 (two) times daily.    [provider]  folic acid (FOLVITE) 1 MG tablet Take 1 mg by mouth daily. Per tube once daily      [provider]  guaiFENesin (MUCINEX) 600 MG 12 hr tablet Take 600 mg by mouth 2 (two) times daily.    [provider]  levothyroxine (SYNTHROID, LEVOTHROID) 50 MCG tablet Take 50 mcg by mouth daily before breakfast.    [provider]  meclizine (ANTIVERT) 12.5 MG  tablet Take 1 tablet (12.5 mg total) by mouth 2 (two) times daily as needed for dizziness. 01/13/18   Willy Eddyobinson, Kaylia Winborne, MD  Nutritional Supplements (OSMOLITE) LIQD Take by mouth. 40 mL per hour     [provider]  omeprazole (PRILOSEC) 20 MG capsule Take 20 mg by mouth daily.    [provider]  Skin Protectants, Misc. (EUCERIN) cream Apply topically as needed.      [provider]    Allergies Predicort [prednisolone acetate]    Social History Social History   Tobacco Use  . Smoking status: Never Smoker  . Smokeless tobacco: Never Used  Substance Use Topics  . Alcohol use: No  . Drug use: No    Review of  Systems Patient denies headaches, rhinorrhea, blurry vision, numbness, shortness of breath, chest pain, edema, cough, abdominal pain, nausea, vomiting, diarrhea, dysuria, fevers, rashes or hallucinations unless otherwise stated above in HPI. ____________________________________________   PHYSICAL EXAM:  VITAL SIGNS: Vitals:   01/13/18 1900 01/13/18 1939  BP:  115/65  Pulse: (!) 52 64  Resp:    Temp:    SpO2: 100% 100%    Constitutional: Alert and oriented.  Eyes: Conjunctivae are normal.  Head: Atraumatic. Nose: No congestion/rhinnorhea. Mouth/Throat: Mucous membranes are moist.   Neck: No stridor. Painless ROM.  Cardiovascular: Normal rate, regular rhythm. Grossly normal heart sounds.  Good peripheral circulation. Respiratory: Normal respiratory effort.  No retractions. Lungs CTAB. Gastrointestinal: Soft and nontender. No distention. No abdominal bruits. No CVA tenderness. Genitourinary:  Musculoskeletal: No lower extremity tenderness nor edema.  No joint effusions. Neurologic:  CN- intact.  No facial droop, Normal FNF.  Normal heel to shin.  Sensation intact bilaterally. Normal speech and language. No gross focal neurologic deficits are appreciated. No gait instability.  HINTS exam negative Skin:  Skin is warm, dry and intact. No rash noted. Psychiatric: Mood and affect are normal. Speech and behavior are normal.  ____________________________________________   LABS (all labs ordered are listed, but only abnormal results are displayed)  Results for orders placed or performed during the hospital encounter of 01/13/18 (from the past 24 hour(s))  Basic metabolic panel     Status: Abnormal   Collection Time: 01/13/18  3:30 PM  Result Value Ref Range   Sodium 136 135 - 145 mmol/L   Potassium 3.9 3.5 - 5.1 mmol/L   Chloride 101 98 - 111 mmol/L   CO2 24 22 - 32 mmol/L   Glucose, Bld 99 70 - 99 mg/dL   BUN 15 8 - 23 mg/dL   Creatinine, Ser 4.090.83 0.44 - 1.00 mg/dL   Calcium 8.8  (L) 8.9 - 10.3 mg/dL   GFR calc non Af Amer >60 >60 mL/min   GFR calc Af Amer >60 >60 mL/min   Anion gap 11 5 - 15  CBC     Status: Abnormal   Collection Time: 01/13/18  3:30 PM  Result Value Ref Range   WBC 7.0 3.6 - 11.0 K/uL   RBC 4.67 3.80 - 5.20 MIL/uL   Hemoglobin 13.1 12.0 - 16.0 g/dL   HCT 81.138.4 91.435.0 - 78.247.0 %   MCV 82.3 80.0 - 100.0 fL   MCH 28.0 26.0 - 34.0 pg   MCHC 34.1 32.0 - 36.0 g/dL   RDW 95.614.7 (H) 21.311.5 - 08.614.5 %   Platelets 194 150 - 440 K/uL  Urinalysis, Complete w Microscopic     Status: Abnormal   Collection Time: 01/13/18  3:30 PM  Result Value  Ref Range   Color, Urine STRAW (A) YELLOW   APPearance CLEAR (A) CLEAR   Specific Gravity, Urine 1.002 (L) 1.005 - 1.030   pH 6.0 5.0 - 8.0   Glucose, UA NEGATIVE NEGATIVE mg/dL   Hgb urine dipstick SMALL (A) NEGATIVE   Bilirubin Urine NEGATIVE NEGATIVE   Ketones, ur NEGATIVE NEGATIVE mg/dL   Protein, ur NEGATIVE NEGATIVE mg/dL   Nitrite NEGATIVE NEGATIVE   Leukocytes, UA NEGATIVE NEGATIVE   WBC, UA 0-5 0 - 5 WBC/hpf   Bacteria, UA NONE SEEN NONE SEEN   Squamous Epithelial / LPF 0-5 0 - 5   ____________________________________________  EKG My review and personal interpretation at Time: 15:26   Indication: dizziness  Rate: 70  Rhythm: sinus Axis: normal Other: sinus with occasional pac, no stemi ____________________________________________  RADIOLOGY  I personally reviewed all radiographic images ordered to evaluate for the above acute complaints and reviewed radiology reports and findings.  These findings were personally discussed with the patient.  Please see medical record for radiology report.  ____________________________________________   PROCEDURES  Procedure(s) performed:  Procedures    Critical Care performed: no ____________________________________________   INITIAL IMPRESSION / ASSESSMENT AND PLAN / ED COURSE  Pertinent labs & imaging results that were available during my care of the  patient were reviewed by me and considered in my medical decision making (see chart for details).   DDX: BPPV, dehydration, anemia, medication of that, UTI, CVA  [name] is a 72 y.o. who presents to the ED with symptoms as described above.  She is afebrile Heema dynamically stable.  No focal neuro deficits.  Blood work will be ordered for the above differential.  Based on her history CT imaging will be ordered.  Will give meclizine as this does seem suspicious for peripheral vertigo based on her exam.  The patient will be placed on continuous pulse oximetry and telemetry for monitoring.  Laboratory evaluation will be sent to evaluate for the above complaints.     Clinical Course as of Jan 13 2257  Wed Jan 13, 2018  1953 Patient reassessed.  States she has had complete resolution of her symptoms.  She is able to ambulate with steady gait.  No ataxia.  Discussed results of CT imaging.  There is some progression of encephalomalacia but do not feel it would be an appropriate territory to explain today's events.  Repeat neuro exam is nonfocal.  I did discuss option for additional evaluation with MRI but patient declined this at this point.  I think this is reasonable given resolution of symptoms and duration of symptoms being over 1 week.  Believe that she stable and appropriate for outpatient follow-up.   [PR]    Clinical Course User Index [PR] Willy Eddy, MD     As part of my medical decision making, I reviewed the following data within the electronic MEDICAL RECORD NUMBER Nursing notes reviewed and incorporated, Labs reviewed, notes from prior ED visits.  ____________________________________________   FINAL CLINICAL IMPRESSION(S) / ED DIAGNOSES  Final diagnoses:  Dizziness      NEW MEDICATIONS STARTED DURING THIS VISIT:  Discharge Medication List as of 01/13/2018  7:57 PM       Note:  This document was prepared using Dragon voice recognition software and may include  unintentional dictation errors.    Willy Eddy, MD 01/13/18 2258

## 2018-01-13 NOTE — ED Notes (Signed)
First RN note:  Pt comes into the ED via [facility] from home c/o dizziness.  VSS with EMS.  Patient has been taken off her metoprolol since Friday.  CBG 121. NIH negative at this time but has h/o CVA in the past.

## 2018-01-13 NOTE — ED Notes (Signed)
Patient transported to CT 

## 2018-02-03 ENCOUNTER — Other Ambulatory Visit: Payer: Self-pay | Admitting: Acute Care

## 2018-02-03 DIAGNOSIS — R42 Dizziness and giddiness: Secondary | ICD-10-CM

## 2018-02-12 ENCOUNTER — Ambulatory Visit
Admission: RE | Admit: 2018-02-12 | Discharge: 2018-02-12 | Disposition: A | Discharge status: Home / Self Care | Payer: Medicare Other | Source: Ambulatory Visit | Attending: Acute Care | Admitting: Acute Care

## 2018-02-12 DIAGNOSIS — I6782 Cerebral ischemia: Secondary | ICD-10-CM | POA: Insufficient documentation

## 2018-02-12 DIAGNOSIS — R42 Dizziness and giddiness: Secondary | ICD-10-CM | POA: Diagnosis present

## 2019-03-17 ENCOUNTER — Inpatient Hospital Stay
Admission: EM | Admit: 2019-03-17 | Discharge: 2019-03-20 | DRG: 292 | Disposition: A | Discharge status: Home Health | Payer: Medicare Other | Attending: Internal Medicine | Admitting: Internal Medicine

## 2019-03-17 ENCOUNTER — Other Ambulatory Visit: Payer: Self-pay

## 2019-03-17 ENCOUNTER — Emergency Department: Payer: Medicare Other

## 2019-03-17 ENCOUNTER — Encounter: Payer: Self-pay | Admitting: Emergency Medicine

## 2019-03-17 DIAGNOSIS — I5023 Acute on chronic systolic (congestive) heart failure: Secondary | ICD-10-CM | POA: Diagnosis present

## 2019-03-17 DIAGNOSIS — I5021 Acute systolic (congestive) heart failure: Secondary | ICD-10-CM | POA: Diagnosis not present

## 2019-03-17 DIAGNOSIS — I480 Paroxysmal atrial fibrillation: Secondary | ICD-10-CM | POA: Diagnosis present

## 2019-03-17 DIAGNOSIS — Z8673 Personal history of transient ischemic attack (TIA), and cerebral infarction without residual deficits: Secondary | ICD-10-CM

## 2019-03-17 DIAGNOSIS — I340 Nonrheumatic mitral (valve) insufficiency: Secondary | ICD-10-CM | POA: Diagnosis present

## 2019-03-17 DIAGNOSIS — Z7982 Long term (current) use of aspirin: Secondary | ICD-10-CM | POA: Diagnosis not present

## 2019-03-17 DIAGNOSIS — Z8701 Personal history of pneumonia (recurrent): Secondary | ICD-10-CM

## 2019-03-17 DIAGNOSIS — E871 Hypo-osmolality and hyponatremia: Secondary | ICD-10-CM | POA: Diagnosis present

## 2019-03-17 DIAGNOSIS — Z79899 Other long term (current) drug therapy: Secondary | ICD-10-CM | POA: Diagnosis not present

## 2019-03-17 DIAGNOSIS — R739 Hyperglycemia, unspecified: Secondary | ICD-10-CM | POA: Diagnosis present

## 2019-03-17 DIAGNOSIS — Z20828 Contact with and (suspected) exposure to other viral communicable diseases: Secondary | ICD-10-CM | POA: Diagnosis present

## 2019-03-17 DIAGNOSIS — Z79890 Hormone replacement therapy (postmenopausal): Secondary | ICD-10-CM

## 2019-03-17 DIAGNOSIS — I110 Hypertensive heart disease with heart failure: Secondary | ICD-10-CM | POA: Diagnosis not present

## 2019-03-17 DIAGNOSIS — E039 Hypothyroidism, unspecified: Secondary | ICD-10-CM | POA: Diagnosis present

## 2019-03-17 DIAGNOSIS — I4891 Unspecified atrial fibrillation: Secondary | ICD-10-CM

## 2019-03-17 DIAGNOSIS — K580 Irritable bowel syndrome with diarrhea: Secondary | ICD-10-CM | POA: Diagnosis present

## 2019-03-17 DIAGNOSIS — I509 Heart failure, unspecified: Secondary | ICD-10-CM

## 2019-03-17 HISTORY — DX: Unspecified atrial fibrillation: I48.91

## 2019-03-17 LAB — CBC
HCT: 39.3 % (ref 36.0–46.0)
Hemoglobin: 13 g/dL (ref 12.0–15.0)
MCH: 27.2 pg (ref 26.0–34.0)
MCHC: 33.1 g/dL (ref 30.0–36.0)
MCV: 82.2 fL (ref 80.0–100.0)
Platelets: 228 10*3/uL (ref 150–400)
RBC: 4.78 MIL/uL (ref 3.87–5.11)
RDW: 14.6 % (ref 11.5–15.5)
WBC: 11.4 10*3/uL — ABNORMAL HIGH (ref 4.0–10.5)
nRBC: 0 % (ref 0.0–0.2)

## 2019-03-17 LAB — BASIC METABOLIC PANEL
Anion gap: 7 (ref 5–15)
BUN: 14 mg/dL (ref 8–23)
CO2: 18 mmol/L — ABNORMAL LOW (ref 22–32)
Calcium: 8.5 mg/dL — ABNORMAL LOW (ref 8.9–10.3)
Chloride: 107 mmol/L (ref 98–111)
Creatinine, Ser: 0.88 mg/dL (ref 0.44–1.00)
GFR calc Af Amer: >60 mL/min (ref >60)
GFR calc non Af Amer: >60 mL/min (ref >60)
Glucose, Bld: 129 mg/dL — ABNORMAL HIGH (ref 70–99)
Potassium: 3.7 mmol/L (ref 3.5–5.1)
Sodium: 132 mmol/L — ABNORMAL LOW (ref 135–145)

## 2019-03-17 LAB — BRAIN NATRIURETIC PEPTIDE: B Natriuretic Peptide: 1070 pg/mL — ABNORMAL HIGH (ref 0.0–100.0)

## 2019-03-17 LAB — LIPID PANEL
Cholesterol: 159 mg/dL (ref 0–200)
HDL: 46 mg/dL (ref >40)
LDL Cholesterol: 92 mg/dL (ref 0–99)
Total CHOL/HDL Ratio: 3.5 RATIO
Triglycerides: 103 mg/dL (ref <150)
VLDL: 21 mg/dL (ref 0–40)

## 2019-03-17 LAB — PROCALCITONIN: Procalcitonin: <0.1 ng/mL

## 2019-03-17 LAB — T4, FREE: Free T4: 1.1 ng/dL (ref 0.61–1.12)

## 2019-03-17 LAB — TROPONIN I (HIGH SENSITIVITY): Troponin I (High Sensitivity): 10 ng/L (ref <18)

## 2019-03-17 LAB — TSH: TSH: 2.94 u[IU]/mL (ref 0.350–4.500)

## 2019-03-17 MED ORDER — FOLIC ACID 1 MG PO TABS
1.0000 mg | ORAL_TABLET | Freq: Every day | ORAL | Status: DC
  Administered 2019-03-18 – 2019-03-20 (×3): 1 mg via ORAL
  Filled 2019-03-17 (×3): qty 1

## 2019-03-17 MED ORDER — APIXABAN 5 MG PO TABS: 5.0000 mg | ORAL_TABLET | Freq: Once | ORAL | Status: DC

## 2019-03-17 MED ORDER — FUROSEMIDE 10 MG/ML IJ SOLN
40.0000 mg | Freq: Once | INTRAMUSCULAR | Status: AC
  Administered 2019-03-17: 40 mg via INTRAVENOUS
  Filled 2019-03-17: qty 4

## 2019-03-17 MED ORDER — LEVOTHYROXINE SODIUM 50 MCG PO TABS
50.0000 ug | ORAL_TABLET | Freq: Every day | ORAL | Status: DC
  Administered 2019-03-18 – 2019-03-20 (×3): 50 ug via ORAL
  Filled 2019-03-17 (×3): qty 1

## 2019-03-17 MED ORDER — PANTOPRAZOLE SODIUM 40 MG PO TBEC
40.0000 mg | DELAYED_RELEASE_TABLET | Freq: Every day | ORAL | Status: DC
  Administered 2019-03-18 – 2019-03-20 (×3): 40 mg via ORAL
  Filled 2019-03-17 (×3): qty 1

## 2019-03-17 MED ORDER — FUROSEMIDE 10 MG/ML IJ SOLN
40.0000 mg | Freq: Every day | INTRAMUSCULAR | Status: DC
  Administered 2019-03-18: 40 mg via INTRAVENOUS
  Filled 2019-03-17: qty 4

## 2019-03-17 MED ORDER — DILTIAZEM HCL 25 MG/5ML IV SOLN: 10.0000 mg | Freq: Once | INTRAVENOUS | Status: DC

## 2019-03-17 MED ORDER — ASPIRIN EC 81 MG PO TBEC
81.0000 mg | DELAYED_RELEASE_TABLET | Freq: Every day | ORAL | Status: DC
  Administered 2019-03-18 – 2019-03-20 (×3): 81 mg via ORAL
  Filled 2019-03-17 (×3): qty 1

## 2019-03-17 MED ORDER — SODIUM CHLORIDE 0.9% FLUSH: 3.0000 mL | Freq: Once | INTRAVENOUS | Status: DC

## 2019-03-17 MED ORDER — LISINOPRIL 5 MG PO TABS
2.5000 mg | ORAL_TABLET | Freq: Every day | ORAL | Status: DC
  Administered 2019-03-17 – 2019-03-18 (×2): 2.5 mg via ORAL
  Filled 2019-03-17 (×2): qty 1

## 2019-03-17 MED ORDER — DOCUSATE SODIUM 100 MG PO CAPS
100.0000 mg | ORAL_CAPSULE | Freq: Two times a day (BID) | ORAL | Status: DC
  Filled 2019-03-17 (×4): qty 1

## 2019-03-17 MED ORDER — METOPROLOL TARTRATE 5 MG/5ML IV SOLN
5.0000 mg | Freq: Once | INTRAVENOUS | Status: AC
  Administered 2019-03-17: 17:00:00 5 mg via INTRAVENOUS
  Filled 2019-03-17: qty 5

## 2019-03-17 MED ORDER — ONDANSETRON HCL 4 MG/2ML IJ SOLN: 4.0000 mg | Freq: Four times a day (QID) | INTRAMUSCULAR | Status: DC | PRN

## 2019-03-17 MED ORDER — DILTIAZEM HCL 25 MG/5ML IV SOLN
10.0000 mg | Freq: Once | INTRAVENOUS | Status: AC
  Administered 2019-03-17: 10 mg via INTRAVENOUS
  Filled 2019-03-17: qty 5

## 2019-03-17 MED ORDER — ACETAMINOPHEN 325 MG PO TABS: 650.0000 mg | ORAL_TABLET | ORAL | Status: DC | PRN

## 2019-03-17 MED ORDER — DILTIAZEM HCL 100 MG IV SOLR
5.0000 mg/h | INTRAVENOUS | Status: DC
  Administered 2019-03-17 – 2019-03-18 (×2): 5 mg/h via INTRAVENOUS
  Filled 2019-03-17 (×2): qty 100

## 2019-03-17 MED ORDER — SODIUM CHLORIDE 0.9% FLUSH: 3.0000 mL | INTRAVENOUS | Status: DC | PRN

## 2019-03-17 MED ORDER — SODIUM CHLORIDE 0.9% FLUSH
3.0000 mL | Freq: Two times a day (BID) | INTRAVENOUS | Status: DC
  Administered 2019-03-17 – 2019-03-20 (×6): 3 mL via INTRAVENOUS

## 2019-03-17 MED ORDER — METOPROLOL TARTRATE 50 MG PO TABS: 50.0000 mg | ORAL_TABLET | Freq: Two times a day (BID) | ORAL | Status: DC

## 2019-03-17 MED ORDER — SODIUM CHLORIDE 0.9 % IV SOLN: 250.0000 mL | INTRAVENOUS | Status: DC | PRN

## 2019-03-17 MED ORDER — CALCIUM CARBONATE-VITAMIN D 500-200 MG-UNIT PO TABS
1.0000 | ORAL_TABLET | Freq: Two times a day (BID) | ORAL | Status: DC
  Administered 2019-03-17 – 2019-03-20 (×6): 1 via ORAL
  Filled 2019-03-17 (×6): qty 1

## 2019-03-17 MED ORDER — FUROSEMIDE 10 MG/ML IJ SOLN: 20.0000 mg | Freq: Once | INTRAMUSCULAR | Status: DC

## 2019-03-17 MED ORDER — APIXABAN 5 MG PO TABS
5.0000 mg | ORAL_TABLET | Freq: Two times a day (BID) | ORAL | Status: DC
  Administered 2019-03-17 – 2019-03-20 (×6): 5 mg via ORAL
  Filled 2019-03-17 (×6): qty 1

## 2019-03-17 NOTE — Progress Notes (Signed)
Family Meeting Note  Advance Directive:no  Today a meeting took place with the Patient.  Patient is able to participate.  The following clinical team members were present during this meeting:MD  The following were discussed:Patient's diagnosis: CHF, a-fib with RVR, Patient's progosis: Unable to determine and Goals for treatment: Full Code  Additional follow-up to be provided: prn  Time spent during discussion:20 minutes  Evette Doffing, MD

## 2019-03-17 NOTE — Consult Note (Signed)
ANTICOAGULATION CONSULT NOTE - Initial Consult  Pharmacy Consult for Apixaban Indication: atrial fibrillation  Allergies  Allergen Reactions  . Prednisone Hives  . Predicort [Prednisolone Acetate]     HIVES,    Patient Measurements: Height: 5\' 5"  (165.1 cm) Weight: 138 lb (62.6 kg) IBW/kg (Calculated) : 57  Vital Signs: Temp: 97.8 F (36.6 C) (10/15 1215) Temp Source: Oral (10/15 1215) BP: 120/92 (10/15 1430) Pulse Rate: 96 (10/15 1430)  Labs: Recent Labs    03/17/19 1230  HGB 13.0  HCT 39.3  PLT 228  CREATININE 0.88  TROPONINIHS 10    Estimated Creatinine Clearance: 49.7 mL/min (by C-G formula based on SCr of 0.88 mg/dL).   Medical History: Past Medical History:  Diagnosis Date  . Acute respiratory failure ([place])    Secondary to aspiration pneumonia   . Altered mental status    Secondary to viral herpes simplex virus encephalitis  . Anemia   . Atrial fibrillation ([place])   . Bilateral pneumonia   . Dysphasia   . Encephalitis   . Hyperglycemia   . Hypertension   . Hyponatremia   . Seizures ([place])   . Stroke [facility])      Assessment: 73 y/o F with pmh of atrial fibrillation c/o weakness and shortness of breath for 2 weeks. Upon exam patient found to be in Afib. Not on anticoagulation prior to admission. CHA2DS2VASc of 7. Pharmacy consulted to start apixaban.   Plan:  -Apixaban 5 mg x 1 today at 1800. Start apixaban 5 mg BID tomorrow morning at 10a.  -CBC per protocol  [place] Resident 03/17/2019,4:49 PM

## 2019-03-17 NOTE — ED Triage Notes (Signed)
Started out saying that she has diarrhea and is worried about dehydration, but then says she alwayas has diarrhea from ibs.  Not changed from usual.  Says she feels weak and has been having shortness of breath for 2 weeks.

## 2019-03-17 NOTE — ED Notes (Signed)
Pt assisted to toilet to void 

## 2019-03-17 NOTE — ED Notes (Signed)
Pt given phone to call family.

## 2019-03-17 NOTE — H&P (Addendum)
[place] [facility] [place] NAME: [name]    MR#:  [id number]  DATE OF BIRTH:  1946-01-08  DATE OF ADMISSION:  03/17/2019  PRIMARY CARE PHYSICIAN: Elba Barman, MD   REQUESTING/REFERRING PHYSICIAN: Lenise Arena, MD  CHIEF COMPLAINT:   Chief Complaint  Patient presents with  . Shortness of Breath  . Weakness    HISTORY OF PRESENT ILLNESS:  [name]  is a 73 y.o. female with a known history of atrial fibrillation, hypertension, seizures, stroke who presented to the ED with progressively worsening shortness of breath over the last couple of weeks.  She also endorses some generalized weakness" leg heaviness".  She denies any lower extremity edema.  No cough, fevers, or chills.  She endorses orthopnea.  She does not take a fluid pill at home.  She denies any chest pain or palpitations.  In the ED, tachycardic with heart rates in the 130s and tachypneic with respiratory rates in the 30s.  Labs are significant for sodium 132, BNP 1070, WBC 11.4.  Chest x-ray with acute congestive heart failure.  Hospitalists were called for admission.  PAST MEDICAL HISTORY:   Past Medical History:  Diagnosis Date  . Acute respiratory failure ([place])    Secondary to aspiration pneumonia   . Altered mental status    Secondary to viral herpes simplex virus encephalitis  . Anemia   . Atrial fibrillation ([place])   . Bilateral pneumonia   . Dysphasia   . Encephalitis   . Hyperglycemia   . Hypertension   . Hyponatremia   . Seizures ([place])   . Stroke [facility])     PAST SURGICAL HISTORY:   Past Surgical History:  Procedure Laterality Date  . CHOLECYSTECTOMY    . COLONOSCOPY WITH PROPOFOL N/A 12/31/2016   Procedure: COLONOSCOPY WITH PROPOFOL;  Surgeon: Manya Silvas, MD;  Location: [facility] ENDOSCOPY;  Service: Endoscopy;  Laterality: N/A;  . ESOPHAGOGASTRODUODENOSCOPY (EGD) WITH PROPOFOL N/A 12/31/2016   Procedure: ESOPHAGOGASTRODUODENOSCOPY (EGD) WITH  PROPOFOL;  Surgeon: Manya Silvas, MD;  Location: [facility] ENDOSCOPY;  Service: Endoscopy;  Laterality: N/A;  . TONSILLECTOMY    . TUBAL LIGATION      SOCIAL HISTORY:   Social History   Tobacco Use  . Smoking status: Never Smoker  . Smokeless tobacco: Never Used  Substance Use Topics  . Alcohol use: No    FAMILY HISTORY:   Family History  Problem Relation Age of Onset  . Prostate cancer Father   . Breast cancer Maternal Aunt   . Breast cancer Maternal Aunt   . Cerebral palsy Son     DRUG ALLERGIES:   Allergies  Allergen Reactions  . Predicort [Prednisolone Acetate]     HIVES,    REVIEW OF SYSTEMS:   Review of Systems  Constitutional: Negative for chills and fever.  HENT: Negative for congestion and sore throat.   Eyes: Negative for blurred vision and double vision.  Respiratory: Positive for shortness of breath. Negative for cough.   Cardiovascular: Positive for orthopnea. Negative for chest pain, palpitations and leg swelling.  Gastrointestinal: Negative for nausea and vomiting.  Genitourinary: Negative for dysuria and urgency.  Musculoskeletal: Negative for back pain and neck pain.  Neurological: Positive for weakness. Negative for dizziness, focal weakness and headaches.  Psychiatric/Behavioral: Negative for depression. The patient is not nervous/anxious.     MEDICATIONS AT HOME:   Prior to Admission medications   Medication Sig Start Date End Date Taking? Authorizing  Provider  acetaminophen (TYLENOL) 325 MG tablet Take 650 mg by mouth every 6 (six) hours as needed. Thru G tube    [provider]  aspirin 81 MG tablet Take 81 mg by mouth daily.      [provider]  Calcium Carb-Cholecalciferol (CALCIUM-VITAMIN D) 500-200 MG-UNIT tablet Take 1 tablet by mouth 2 (two) times daily.    [provider]  calcium-vitamin D (OSCAL WITH D) 500-200 MG-UNIT tablet Take 1 tablet by mouth 2 (two) times daily.    [provider]   diltiazem (CARDIZEM CD) 120 MG 24 hr capsule Take 1 capsule (120 mg total) by mouth daily. 07/08/17   Salary, Evelena AsaMontell D, MD  docusate sodium (COLACE) 100 MG capsule Take 100 mg by mouth 2 (two) times daily.    [provider]  folic acid (FOLVITE) 1 MG tablet Take 1 mg by mouth daily. Per tube once daily      [provider]  guaiFENesin (MUCINEX) 600 MG 12 hr tablet Take 600 mg by mouth 2 (two) times daily.    [provider]  levothyroxine (SYNTHROID, LEVOTHROID) 50 MCG tablet Take 50 mcg by mouth daily before breakfast.    [provider]  meclizine (ANTIVERT) 12.5 MG tablet Take 1 tablet (12.5 mg total) by mouth 2 (two) times daily as needed for dizziness. 01/13/18   Willy Eddyobinson, Patrick, MD  Nutritional Supplements (OSMOLITE) LIQD Take by mouth. 40 mL per hour     [provider]  omeprazole (PRILOSEC) 20 MG capsule Take 20 mg by mouth daily.    [provider]  Skin Protectants, Misc. (EUCERIN) cream Apply topically as needed.      [provider]      VITAL SIGNS:  Blood pressure (!) 120/92, pulse 96, temperature 97.8 F (36.6 C), temperature source Oral, resp. rate (!) 32, height 5\' 5"  (1.651 m), weight 62.6 kg, SpO2 95 %.  PHYSICAL EXAMINATION:  Physical Exam  GENERAL:  73 y.o.-year-old patient patient lying in the bed with no acute distress.  EYES: Pupils equal, round, reactive to light and accommodation. No scleral icterus. Extraocular muscles intact.  HEENT: Head atraumatic, normocephalic. Oropharynx and nasopharynx clear.  NECK:  Supple, no jugular venous distention. No thyroid enlargement, no tenderness.  LUNGS: +diminished breath sounds in the lung bases bilaterally. No use of accessory muscles of respiration.  CARDIOVASCULAR: Irregularly irregular rhythm, tachycardic, S1, S2 normal. No murmurs, rubs, or gallops.  ABDOMEN: Soft, nontender, nondistended. Bowel sounds present. No organomegaly or mass.  EXTREMITIES: No pedal  edema, cyanosis, or clubbing.  NEUROLOGIC: Cranial nerves II through XII are intact. Muscle strength 5/5 in all extremities. Sensation intact. Gait not checked.  PSYCHIATRIC: The patient is alert and oriented x 3.  SKIN: No obvious rash, lesion, or ulcer.   LABORATORY PANEL:   CBC Recent Labs  Lab 03/17/19 1230  WBC 11.4*  HGB 13.0  HCT 39.3  PLT 228   ------------------------------------------------------------------------------------------------------------------  Chemistries  Recent Labs  Lab 03/17/19 1230  NA 132*  K 3.7  CL 107  CO2 18*  GLUCOSE 129*  BUN 14  CREATININE 0.88  CALCIUM 8.5*   ------------------------------------------------------------------------------------------------------------------  Cardiac Enzymes No results for input(s): TROPONINI in the last 168 hours. ------------------------------------------------------------------------------------------------------------------  RADIOLOGY:  Dg Chest 2 View  Result Date: 03/17/2019 CLINICAL DATA:  Shortness of breath for 2 weeks. EXAM: CHEST - 2 VIEW COMPARISON:  08/28/2011 FINDINGS: Mild cardiomegaly is seen with increased heart size since prior study. New diffuse interstitial infiltrates  are seen, consistent with interstitial edema. Bibasilar atelectasis or infiltrates are also new since previous study. IMPRESSION: Acute congestive heart failure. New bibasilar atelectasis or infiltrates; superimposed pneumonia cannot be excluded. Electronically Signed   By: Danae Orleans M.D.   On: 03/17/2019 12:55      IMPRESSION AND PLAN:   Acute on chronic congestive heart failure with mid-range EF- Last ECHO 08/12/18 with EF 45-50% and severe mitral regurgitation. Follows with Dr. Darrold Junker as an outpatient. -Lasix 40mg  IV daily -Check ECHO -[facility] Cardiology consult -Start low dose lisinopril  -Strict I/O, daily weights  Paroxysmal atrial fibrillation with RVR- HRs remains in the 130s s/p IV diltiazem and IV  lopressor. CHADSVASc is a 7. -Start eliquis -Start diltiazem gtt -Check TSH -Cardiac monitoring  Leukocytosis- likely reactive. No signs of infection. -Check procalcitonin to rule out pneumonia -Recheck CBC in the morning  Hypervolemic hyponatremia- due to volume overload -Lasix as above  History of stroke -Continue home aspirin -Check lipid panel  Hypothyroidism- stable -Continue home synthroid -Check TSH  Hyperglycemia- no diagnosis of diabetes -Check A1c  All the records are reviewed and case discussed with ED provider. Management plans discussed with the patient, family and they are in agreement.  CODE STATUS: Full  TOTAL TIME TAKING CARE OF THIS PATIENT: 45 minutes.    [facility] Havoc Sanluis M.D on 03/17/2019 at 2:51 PM  Between 7am to 6pm - Pager - [phone]  After 6pm go to [email or web address] - [phone]  [facility] [place] [facility]  Office  [phone]  CC: Primary care physician; [phone], MD   Note: This dictation was prepared with Dragon dictation along with smaller phrase technology. Any transcriptional errors that result from this process are unintentional.

## 2019-03-17 NOTE — ED Notes (Signed)
Pt assisted back in bed from toilet

## 2019-03-17 NOTE — ED Notes (Signed)
VO Dr Ubaldo Glassing for Cardizem 10mg  bolus and then start cardizem drip at 5mg  and titrate per standing orders

## 2019-03-17 NOTE — ED Notes (Signed)
Pt HR remains 110-125 - message sent to Dr Ubaldo Glassing

## 2019-03-17 NOTE — ED Notes (Addendum)
ED TO INPATIENT HANDOFF REPORT  ED Nurse Name and Phone #: Peggye Form Name/Age/Gender [name] 74 y.o. female Room/Bed: ED10A/ED10A  Code Status   Code Status: Prior  Home/SNF/Other Home Patient oriented to: self, place, time and situation Is this baseline? Yes   Triage Complete: Triage complete  Chief Complaint dizziness, sob  Triage Note Started out saying that she has diarrhea and is worried about dehydration, but then says she alwayas has diarrhea from ibs.  Not changed from usual.  Says she feels weak and has been having shortness of breath for 2 weeks.   Allergies Allergies  Allergen Reactions  . Prednisone Hives  . Predicort [Prednisolone Acetate]     HIVES,    Level of Care/Admitting Diagnosis ED Disposition    ED Disposition Condition Comment   Admit  Hospital Area: [facility] [[id number]]  Level of Care: Telemetry [5]  Covid Evaluation: Asymptomatic Screening Protocol (No Symptoms)  Diagnosis: Acute congestive heart failure [facility]) [[id number]]  Admitting Physician: Willadean Carol DODD [[id number]]  Attending Physician: Willadean Carol DODD [[id number]]  Estimated length of stay: past midnight tomorrow  Certification:: I certify this patient will need inpatient services for at least 2 midnights  PT Class (Do Not Modify): Inpatient [101]  PT Acc Code (Do Not Modify): Private [1]       B Medical/Surgery History Past Medical History:  Diagnosis Date  . Acute respiratory failure (HCC)    Secondary to aspiration pneumonia   . Altered mental status    Secondary to viral herpes simplex virus encephalitis  . Anemia   . Atrial fibrillation (HCC)   . Bilateral pneumonia   . Dysphasia   . Encephalitis   . Hyperglycemia   . Hypertension   . Hyponatremia   . Seizures (HCC)   . Stroke [facility])    Past Surgical History:  Procedure Laterality Date  . CHOLECYSTECTOMY    . COLONOSCOPY WITH PROPOFOL N/A 12/31/2016   Procedure: COLONOSCOPY WITH PROPOFOL;   Surgeon: Scot Jun, MD;  Location: [facility] ENDOSCOPY;  Service: Endoscopy;  Laterality: N/A;  . ESOPHAGOGASTRODUODENOSCOPY (EGD) WITH PROPOFOL N/A 12/31/2016   Procedure: ESOPHAGOGASTRODUODENOSCOPY (EGD) WITH PROPOFOL;  Surgeon: Scot Jun, MD;  Location: [facility] ENDOSCOPY;  Service: Endoscopy;  Laterality: N/A;  . TONSILLECTOMY    . TUBAL LIGATION       A IV Location/Drains/Wounds Patient Lines/Drains/Airways Status   Active Line/Drains/Airways    Name:   Placement date:   Placement time:   Site:   Days:   Peripheral IV 03/17/19 Left Antecubital   03/17/19    1355    Antecubital   less than 1          Intake/Output Last 24 hours  Intake/Output Summary (Last 24 hours) at 03/17/2019 1942 Last data filed at 03/17/2019 1717 Gross per 24 hour  Intake -  Output 1400 ml  Net -1400 ml    Labs/Imaging Results for orders placed or performed during the hospital encounter of 03/17/19 (from the past 48 hour(s))  Basic metabolic panel     Status: Abnormal   Collection Time: 03/17/19 12:30 PM  Result Value Ref Range   Sodium 132 (L) 135 - 145 mmol/L   Potassium 3.7 3.5 - 5.1 mmol/L   Chloride 107 98 - 111 mmol/L   CO2 18 (L) 22 - 32 mmol/L   Glucose, Bld 129 (H) 70 - 99 mg/dL   BUN 14 8 - 23 mg/dL   Creatinine, Ser 8.11  0.44 - 1.00 mg/dL   Calcium 8.5 (L) 8.9 - 10.3 mg/dL   GFR calc non Af Amer >60 >60 mL/min   GFR calc Af Amer >60 >60 mL/min   Anion gap 7 5 - 15    Comment: Performed at [facility], [address] [place], [place] [address]  CBC     Status: Abnormal   Collection Time: 03/17/19 12:30 PM  Result Value Ref Range   WBC 11.4 (H) 4.0 - 10.5 K/uL   RBC 4.78 3.87 - 5.11 MIL/uL   Hemoglobin 13.0 12.0 - 15.0 g/dL   HCT 60.439.3 54.036.0 - 98.146.0 %   MCV 82.2 80.0 - 100.0 fL   MCH 27.2 26.0 - 34.0 pg   MCHC 33.1 30.0 - 36.0 g/dL   RDW 19.114.6 47.811.5 - 29.515.5 %   Platelets 228 150 - 400 K/uL   nRBC 0.0 0.0 - 0.2 %    Comment: Performed at [facility],  [address] [place], [place] [address]  Troponin I (High Sensitivity)     Status: None   Collection Time: 03/17/19 12:30 PM  Result Value Ref Range   Troponin I (High Sensitivity) 10 <18 ng/L    Comment: (NOTE) Elevated high sensitivity troponin I (hsTnI) values and significant  changes across serial measurements may suggest ACS but many other  chronic and acute conditions are known to elevate hsTnI results.  Refer to the "Links" section for chest pain algorithms and additional  guidance. Performed at [facility], [address] [place], [place] [address]   T4, free     Status: None   Collection Time: 03/17/19 12:30 PM  Result Value Ref Range   Free T4 1.10 0.61 - 1.12 ng/dL    Comment: (NOTE) Biotin ingestion may interfere with free T4 tests. If the results are inconsistent with the TSH level, previous test results, or the clinical presentation, then consider biotin interference. If needed, order repeat testing after stopping biotin. Performed at [facility], [address] [place], [place] [address]   TSH     Status: None   Collection Time: 03/17/19 12:30 PM  Result Value Ref Range   TSH 2.940 0.350 - 4.500 uIU/mL    Comment: Performed by a 3rd Generation assay with a functional sensitivity of <=0.01 uIU/mL. Performed at [facility], [address] [place], [place] [address]   Brain natriuretic peptide     Status: Abnormal   Collection Time: 03/17/19 12:30 PM  Result Value Ref Range   B Natriuretic Peptide 1,070.0 (H) 0.0 - 100.0 pg/mL    Comment: Performed at [facility], [address] [place], [place] [address]   Dg Chest 2 View  Result Date: 03/17/2019 CLINICAL DATA:  Shortness of breath for 2 weeks. EXAM: CHEST - 2 VIEW COMPARISON:  08/28/2011 FINDINGS: Mild cardiomegaly is seen with increased heart size since prior study. New diffuse interstitial infiltrates are seen, consistent with interstitial edema. Bibasilar  atelectasis or infiltrates are also new since previous study. IMPRESSION: Acute congestive heart failure. New bibasilar atelectasis or infiltrates; superimposed pneumonia cannot be excluded. Electronically Signed   By: Danae OrleansJohn A Stahl M.D.   On: 03/17/2019 12:55    Pending Labs Unresulted Labs (From admission, onward)    Start     Ordered   03/18/19 0500  Hemoglobin A1c  Tomorrow morning,   STAT     03/17/19 1624   03/17/19 1623  Procalcitonin - Baseline  Add-on,   AD  03/17/19 1622   03/17/19 1336  SARS CORONAVIRUS 2 (TAT 6-24 HRS) Nasopharyngeal Nasopharyngeal Swab  (Asymptomatic/Tier 2 Patients Labs)  Once,   STAT    Question Answer Comment  Is this test for diagnosis or screening Screening   Symptomatic for COVID-19 as defined by CDC No   Hospitalized for COVID-19 No   Admitted to ICU for COVID-19 No   Previously tested for COVID-19 No   Resident in a congregate (group) care setting No   Employed in healthcare setting No   Pregnant No      03/17/19 1335   Signed and Held  Basic metabolic panel  Daily,   R     Signed and Held   Signed and Held  TSH  Tomorrow morning,   R     Signed and Held   Signed and Held  Lipid panel  Once,   R     Signed and Held          Vitals/Pain Today's Vitals   03/17/19 1750 03/17/19 1751 03/17/19 1800 03/17/19 1830  BP:   (!) 135/95 (!) 135/99  Pulse: (!) 102 (!) 113 (!) 114 (!) 117  Resp: (!) 35 (!) 33 20 (!) 24  Temp:      TempSrc:      SpO2: 98% 99% 96% 96%  Weight:      Height:      PainSc:        Isolation Precautions No active isolations  Medications Medications  sodium chloride flush (NS) 0.9 % injection 3 mL (has no administration in time range)  apixaban (ELIQUIS) tablet 5 mg (has no administration in time range)  apixaban (ELIQUIS) tablet 5 mg (has no administration in time range)  diltiazem (CARDIZEM) 100 mg in dextrose 5 % 100 mL (1 mg/mL) infusion (5 mg/hr Intravenous New Bag/Given 03/17/19 1837)  diltiazem  (CARDIZEM) injection 10 mg (10 mg Intravenous Given 03/17/19 1359)  furosemide (LASIX) injection 40 mg (40 mg Intravenous Given 03/17/19 1359)  metoprolol tartrate (LOPRESSOR) injection 5 mg (5 mg Intravenous Given 03/17/19 1723)  diltiazem (CARDIZEM) injection 10 mg (10 mg Intravenous Given 03/17/19 1833)    Mobility walks with person assist Low fall risk   Focused Assessments    R Recommendations: See Admitting Provider Note  Report given to: Lars Mage, RN

## 2019-03-17 NOTE — ED Provider Notes (Signed)
[facility] Emergency Department Provider Note       Time seen: ----------------------------------------- 1:30 PM on 03/17/2019 -----------------------------------------  I have reviewed the triage vital signs and the nursing notes.  HISTORY   Chief Complaint Shortness of Breath and Weakness   HPI [name] is a 73 y.o. female with a history of pneumonia, anemia, atrial fibrillation, encephalitis, hypertension, seizures, CVA who presents to the ED for weakness and shortness of breath for the past 2 weeks.  She does describe orthopnea, no fever or productive cough.  She also is complaining of some diarrhea that she has due to chronic IBS.  Past Medical History:  Diagnosis Date  . Acute respiratory failure ([place])    Secondary to aspiration pneumonia   . Altered mental status    Secondary to viral herpes simplex virus encephalitis  . Anemia   . Atrial fibrillation ([place])   . Bilateral pneumonia   . Dysphasia   . Encephalitis   . Hyperglycemia   . Hypertension   . Hyponatremia   . Seizures ([place])   . Stroke [facility])     Patient Active Problem List   Diagnosis Date Noted  . Acute gastroenteritis 07/05/2017  . Acute respiratory failure ([place]) 03/27/2011    Past Surgical History:  Procedure Laterality Date  . CHOLECYSTECTOMY    . COLONOSCOPY WITH PROPOFOL N/A 12/31/2016   Procedure: COLONOSCOPY WITH PROPOFOL;  Surgeon: Manya Silvas, MD;  Location: [facility] END[facility]COPY;  Service: Endoscopy;  Laterality: N/A;  . ESOPHAGOGASTRODUODENOSCOPY (EGD) WITH PROPOFOL N/A 12/31/2016   Procedure: ESOPHAGOGASTRODUODENOSCOPY (EGD) WITH PROPOFOL;  Surgeon: Manya Silvas, MD;  Location[facility] END[facility]COPY;  Service: Endoscopy;  Laterality: N/A;  . TONSILLECTOMY    . TUBAL LIGATION      Allergies Predicort [prednisolone acetate]  Social History Social History   Tobacco Use  . Smoking status: Never Smoker  . Smokeless tobacco: Never Used  Substance Use Topics   . Alcohol use: No  . Drug use: No   Review of Systems Constitutional: Negative for fever. Cardiovascular: Negative for chest pain. Respiratory: Positive for shortness of breath Gastrointestinal: Negative for abdominal pain, positive for chronic diarrhea Musculoskeletal: Negative for back pain. Skin: Negative for rash. Neurological: Negative for headaches, focal weakness or numbness.  All systems negative/normal/unremarkable except as stated in the HPI  ____________________________________________   PHYSICAL EXAM:  VITAL SIGNS: ED Triage Vitals  Enc Vitals Group     BP 03/17/19 1220 (!) 133/101     Pulse Rate 03/17/19 1215 (!) 138     Resp 03/17/19 1215 18     Temp 03/17/19 1215 97.8 F (36.6 C)     Temp Source 03/17/19 1215 Oral     SpO2 03/17/19 1215 96 %     Weight 03/17/19 1216 138 lb (62.6 kg)     Height 03/17/19 1216 5\' 5"  (1.651 m)     Head Circumference --      Peak Flow --      Pain Score 03/17/19 1216 0     Pain Loc --      Pain Edu? --      Excl. in [place]? --    Constitutional: Alert and oriented. Well appearing and in no distress. Eyes: Conjunctivae are normal. Normal extraocular movements. ENT      Head: Normocephalic and atraumatic.      Nose: No congestion/rhinnorhea.      Mouth/Throat: Mucous membranes are moist.      Neck: No stridor. Cardiovascular: Rapid  rate, irregular rhythm. No murmurs, rubs, or gallops. Respiratory: Minimal tachypnea, rales in the bases are noted Gastrointestinal: Soft and nontender. Normal bowel sounds Musculoskeletal: Nontender with normal range of motion in extremities. No lower extremity tenderness nor edema. Neurologic:  Normal speech and language. No gross focal neurologic deficits are appreciated.  Skin:  Skin is warm, dry and intact. No rash noted. Psychiatric: Mood and affect are normal. Speech and behavior are normal.  ____________________________________________  EKG: Interpreted by me.  Atrial fibrillation with  rapid ventricular response, rate is 127 bpm, rightward axis, normal QT  ____________________________________________  ED COURSE:  As part of my medical decision making, I reviewed the following data within the electronic MEDICAL RECORD NUMBER History obtained from family if available, nursing notes, old chart and ekg, as well as notes from prior ED visits. Patient presented for shortness of breath, we will assess with labs and imaging as indicated at this time.   Procedures  [name] was evaluated in Emergency Department on 03/17/2019 for the symptoms described in the history of present illness. She was evaluated in the context of the global COVID-19 pandemic, which necessitated consideration that the patient might be at risk for infection with the SARS-CoV-2 virus that causes COVID-19. Institutional protocols and algorithms that pertain to the evaluation of patients at risk for COVID-19 are in a state of rapid change based on information released by regulatory bodies including the CDC and federal and state organizations. These policies and algorithms were followed during the patient's care in the ED.  ____________________________________________   LABS (pertinent positives/negatives)  Labs Reviewed  BASIC METABOLIC PANEL - Abnormal; Notable for the following components:      Result Value   Sodium 132 (*)    CO2 18 (*)    Glucose, Bld 129 (*)    Calcium 8.5 (*)    All other components within normal limits  CBC - Abnormal; Notable for the following components:   WBC 11.4 (*)    All other components within normal limits  BRAIN NATRIURETIC PEPTIDE - Abnormal; Notable for the following components:   B Natriuretic Peptide 1,070.0 (*)    All other components within normal limits  SARS CORONAVIRUS 2 (TAT 6-24 HRS)  T4, FREE  TSH  TROPONIN I (HIGH SENSITIVITY)   CRITICAL CARE Performed by: Ulice DashJohnathan E    Total critical care time: 30 minutes  Critical care time was exclusive  of separately billable procedures and treating other patients.  Critical care was necessary to treat or prevent imminent or life-threatening deterioration.  Critical care was time spent personally by me on the following activities: development of treatment plan with patient and/or surrogate as well as nursing, discussions with consultants, evaluation of patient's response to treatment, examination of patient, obtaining history from patient or surrogate, ordering and performing treatments and interventions, ordering and review of laboratory studies, ordering and review of radiographic studies, pulse oximetry and re-evaluation of patient's condition.  RADIOLOGY Images were viewed by me  Chest x-ray IMPRESSION: Acute congestive heart failure.  New bibasilar atelectasis or infiltrates; superimposed pneumonia cannot be excluded.  ____________________________________________   DIFFERENTIAL DIAGNOSIS   CHF, COPD, pneumonia, coronavirus, rapid atrial fibrillation  FINAL ASSESSMENT AND PLAN  Acute CHF exacerbation, rapid A. fib   Plan: The patient had presented for shortness of breath and initially arrived in rapid A. fib and appeared to have new onset CHF on her x-rays. Patient's labs indicated congestive heart failure. Patient's imaging resembled acute CHF for which we  gave IV Lasix.  She was also in rapid atrial fibrillation and received IV Cardizem with improved rate control.  She does have chronic atrial fibrillation.  She has no history of congestive heart failure.  I will discuss with the hospitalist for admission.   Ulice Dash, MD    Note: This note was generated in part or whole with voice recognition software. Voice recognition is usually quite accurate but there are transcription errors that can and very often do occur. I apologize for any typographical errors that were not detected and corrected.     Emily Filbert, MD 03/17/19 1438

## 2019-03-17 NOTE — Consult Note (Signed)
Cardiology Consultation Note    Patient ID: [name], MRN: [id number], DOB/AGE: 75/10/1945 75 y.o. Admit date: 03/17/2019   Date of Consult: 03/17/2019 Primary Physician: Mickel FuchsWroth, Thomas H, MD Primary Cardiologist: Dr. Darrold JunkerParaschos  Chief Complaint: sob Reason for Consultation: afib with rvr/sob Requesting MD: Dr. Nancy MarusMayo  HPI: [name] is a 73 y.o. female with history of paroxysmal atrial fibrillation, severe mitral regurgitation mitral valve prolapse with severe posterior leaflet prolapse who presented to the emergency room complaining of shortness of breath.  She was noted to be in atrial fibrillation with rapid response.  She was treated with metoprolol succinate at 25 mg daily as an outpatient.  Holter monitor done recently revealed predominant sinus rhythm with intermittent atrial fibrillation.  Most recent echo done in March 2020 revealed ejection fraction of 50 to 55% with severe MR.  She was not anticoagulated due to deferring this as an outpatient.  She also deferred consideration for valvular intervention as late as August of this year.  She presented to emergency room with complaints of shortness of breath and found to be in A. fib with rapid ventricular response tachypneic.  Her BNP was 1070.  Chest x-ray showed evidence of acute congestive heart failure.  High-sensitivity troponin was normal x2 of 10.  TSH was 2.94.  Past Medical History:  Diagnosis Date  . Acute respiratory failure (HCC)    Secondary to aspiration pneumonia   . Altered mental status    Secondary to viral herpes simplex virus encephalitis  . Anemia   . Atrial fibrillation (HCC)   . Bilateral pneumonia   . Dysphasia   . Encephalitis   . Hyperglycemia   . Hypertension   . Hyponatremia   . Seizures (HCC)   . Stroke [facility](HCC)       Surgical History:  Past Surgical History:  Procedure Laterality Date  . CHOLECYSTECTOMY    . COLONOSCOPY WITH PROPOFOL N/A 12/31/2016   Procedure: COLONOSCOPY WITH  PROPOFOL;  Surgeon: Scot JunElliott, Robert T, MD;  [facility];  Service: Endoscopy;  Laterality: N/A;  . ESOPHAGOGASTRODUODENOSCOPY (EGD) WITH PROPOFOL N/A 12/31/2016   Procedure: ESOPHAGOGASTRODUODENOSCOPY (EGD) WITH PROPOFOL;  Surgeon: Scot JunElliott, Robert T, MD;  Location[facility] END[facility]COPY;  Service: Endoscopy;  Laterality: N/A;  . TONSILLECTOMY    . TUBAL LIGATION       Home Meds: Prior to Admission medications   Medication Sig Start Date End Date Taking? Authorizing Provider  acetaminophen (TYLENOL) 325 MG tablet Take 650 mg by mouth every 6 (six) hours as needed. Thru G tube    [provider]  aspirin 81 MG tablet Take 81 mg by mouth daily.      [provider]  Calcium Carb-Cholecalciferol (CALCIUM-VITAMIN D) 500-200 MG-UNIT tablet Take 1 tablet by mouth 2 (two) times daily.    [provider]  calcium-vitamin D (OSCAL WITH D) 500-200 MG-UNIT tablet Take 1 tablet by mouth 2 (two) times daily.    [provider]  diltiazem (CARDIZEM CD) 120 MG 24 hr capsule Take 1 capsule (120 mg total) by mouth daily. 07/08/17   Salary, Evelena AsaMontell D, MD  docusate sodium (COLACE) 100 MG capsule Take 100 mg by mouth 2 (two) times daily.    [provider]  folic acid (FOLVITE) 1 MG tablet Take 1 mg by mouth daily. Per tube once daily      [provider]  guaiFENesin (MUCINEX) 600 MG 12 hr tablet Take 600 mg by mouth 2 (two) times daily.  [provider]  levothyroxine (SYNTHROID, LEVOTHROID) 50 MCG tablet Take 50 mcg by mouth daily before breakfast.    [provider]  meclizine (ANTIVERT) 12.5 MG tablet Take 1 tablet (12.5 mg total) by mouth 2 (two) times daily as needed for dizziness. 01/13/18   Merlyn Lot, MD  Nutritional Supplements (OSMOLITE) LIQD Take by mouth. 40 mL per hour     [provider]  omeprazole (PRILOSEC) 20 MG capsule Take 20 mg by mouth daily.    [provider]  Skin Protectants, Misc.  (EUCERIN) cream Apply topically as needed.      [provider]    Inpatient Medications:  . metoprolol tartrate  5 mg Intravenous Once  . sodium chloride flush  3 mL Intravenous Once     Allergies:  Allergies  Allergen Reactions  . Predicort [Prednisolone Acetate]     HIVES,    Social History   Socioeconomic History  . Marital status: Widowed    Spouse name: Not on file  . Number of children: 2  . Years of education: Not on file  . Highest education level: Not on file  Occupational History  . Occupation: medical transcript  Social Needs  . Financial resource strain: Not on file  . Food insecurity    Worry: Not on file    Inability: Not on file  . Transportation needs    Medical: Not on file    Non-medical: Not on file  Tobacco Use  . Smoking status: Never Smoker  . Smokeless tobacco: Never Used  Substance and Sexual Activity  . Alcohol use: No  . Drug use: No  . Sexual activity: Never  Lifestyle  . Physical activity    Days per week: Not on file    Minutes per session: Not on file  . Stress: Not on file  Relationships  . Social Herbalist on phone: Not on file    Gets together: Not on file    Attends religious service: Not on file    Active member of club or organization: Not on file    Attends meetings of clubs or organizations: Not on file    Relationship status: Not on file  . Intimate partner violence    Fear of current or ex partner: Not on file    Emotionally abused: Not on file    Physically abused: Not on file    Forced sexual activity: Not on file  Other Topics Concern  . Not on file  Social History Narrative  . Not on file     Family History  Problem Relation Age of Onset  . Prostate cancer Father   . Breast cancer Maternal Aunt   . Breast cancer Maternal Aunt   . Cerebral palsy Son      Review of Systems: A 12-system review of systems was performed and is negative except as noted in the HPI.  Labs: No results  for input(s): CKTOTAL, CKMB, TROPONINI in the last 72 hours. Lab Results  Component Value Date   WBC 11.4 (H) 03/17/2019   HGB 13.0 03/17/2019   HCT 39.3 03/17/2019   MCV 82.2 03/17/2019   PLT 228 03/17/2019    Recent Labs  Lab 03/17/19 1230  NA 132*  K 3.7  CL 107  CO2 18*  BUN 14  CREATININE 0.88  CALCIUM 8.5*  GLUCOSE 129*   Lab Results  Component Value Date   CHOL 222 (H) 08/28/2011   HDL 55 08/28/2011  LDLCALC 142 (H) 08/28/2011   TRIG 127 08/28/2011   No results found for: DDIMER  Radiology/Studies:  Dg Chest 2 View  Result Date: 03/17/2019 CLINICAL DATA:  Shortness of breath for 2 weeks. EXAM: CHEST - 2 VIEW COMPARISON:  08/28/2011 FINDINGS: Mild cardiomegaly is seen with increased heart size since prior study. New diffuse interstitial infiltrates are seen, consistent with interstitial edema. Bibasilar atelectasis or infiltrates are also new since previous study. IMPRESSION: Acute congestive heart failure. New bibasilar atelectasis or infiltrates; superimposed pneumonia cannot be excluded. Electronically Signed   By: Danae Orleans M.D.   On: 03/17/2019 12:55    Wt Readings from Last 3 Encounters:  03/17/19 62.6 kg  01/13/18 63.5 kg  07/07/17 61 kg    EKG: afib with rvr  Physical Exam:  Blood pressure (!) 120/92, pulse 96, temperature 97.8 F (36.6 C), temperature source Oral, resp. rate (!) 32, height 5\' 5"  (1.651 m), weight 62.6 kg, SpO2 95 %. Body mass index is 22.96 kg/m. General: Well developed, well nourished, in no acute distress. Head: Normocephalic, atraumatic, sclera non-icteric, no xanthomas, nares are without discharge.  Neck: Negative for carotid bruits. JVD not elevated. Lungs: Clear bilaterally to auscultation without wheezes, rales, or rhonchi. Breathing is unlabored. Heart: irr, irr with 2/6 systollic murmur Abdomen: Soft, non-tender, non-distended with normoactive bowel sounds. No hepatomegaly. No rebound/guarding. No obvious abdominal  masses. Msk:  Strength and tone appear normal for age. Extremities: No clubbing or cyanosis. No edema.  Distal pedal pulses are 2+ and equal bilaterally. Neuro: Alert and oriented X 3. No facial asymmetry. No focal deficit. Moves all extremities spontaneously. Psych:  Responds to questions appropriately with a normal affect.     Assessment and Plan  Afib-rvr. Will attempt to control afib with iv cardizem. Will rebolus with cardizem and place on cadizem drip. Will use eliquis 5 mg bid.    MR-severe mr by recent echo. Will control rate of afib and diurese. Consider mitraclip when rate is improved and chf is improved.   Signed, MD 03/17/2019, 4:31 PM Pager: [phone]

## 2019-03-17 NOTE — ED Notes (Signed)
Assisted pt to toilet to void 

## 2019-03-18 ENCOUNTER — Inpatient Hospital Stay
Admit: 2019-03-18 | Discharge: 2019-03-18 | Disposition: A | Discharge status: Home / Self Care | Payer: Medicare Other | Attending: Internal Medicine | Admitting: Internal Medicine

## 2019-03-18 LAB — HEMOGLOBIN A1C
Hgb A1c MFr Bld: 5.2 % (ref 4.8–5.6)
Mean Plasma Glucose: 102.54 mg/dL

## 2019-03-18 LAB — BASIC METABOLIC PANEL
Anion gap: 4 — ABNORMAL LOW (ref 5–15)
BUN: 12 mg/dL (ref 8–23)
CO2: 23 mmol/L (ref 22–32)
Calcium: 8.5 mg/dL — ABNORMAL LOW (ref 8.9–10.3)
Chloride: 111 mmol/L (ref 98–111)
Creatinine, Ser: 0.78 mg/dL (ref 0.44–1.00)
GFR calc Af Amer: >60 mL/min (ref >60)
GFR calc non Af Amer: >60 mL/min (ref >60)
Glucose, Bld: 107 mg/dL — ABNORMAL HIGH (ref 70–99)
Potassium: 3.7 mmol/L (ref 3.5–5.1)
Sodium: 138 mmol/L (ref 135–145)

## 2019-03-18 LAB — SARS CORONAVIRUS 2 (TAT 6-24 HRS): SARS Coronavirus 2: NEGATIVE

## 2019-03-18 LAB — ECHOCARDIOGRAM COMPLETE
Height: 65 in
Weight: 2395.2 oz

## 2019-03-18 LAB — GLUCOSE, CAPILLARY: Glucose-Capillary: 151 mg/dL — ABNORMAL HIGH (ref 70–99)

## 2019-03-18 LAB — TSH: TSH: 2.652 u[IU]/mL (ref 0.350–4.500)

## 2019-03-18 MED ORDER — DILTIAZEM HCL ER COATED BEADS 180 MG PO CP24
180.0000 mg | ORAL_CAPSULE | Freq: Every day | ORAL | Status: DC
  Administered 2019-03-18: 180 mg via ORAL
  Filled 2019-03-18: qty 1

## 2019-03-18 NOTE — TOC Initial Note (Addendum)
Transition of Care [facility]) - Initial/Assessment Note    Patient Details  Name: [name] MRN: [id number] Date of Birth: Feb 06, 1946  Transition of Care [facility]) CM/SW Contact:    Ross Ludwig, LCSW Phone Number: 03/18/2019, 4:03 PM  Clinical Narrative:                  Patient is a 73 year old female who is alert and oriented x4.  Patient states that she has not had home health before, CSW explained how insurance will pay for it.  CSW discussed with patient how home health can use a heart failure protocol to help reduce the chances of readmission.  CSW received consult for Heart Failure home  Health Screen.  Patient stated she does not have a scale or blood pressure cuff, CSW was able to provide a donated scale and blood pressure cuff.  Patient was very appreciative of receiving items.  Patient was not weighing herself daily, she stated she will start, patient also was explained to notify her doctor if she has weight gain.  Patient also will be scheduled for the Heart Failure Clinic, she did speak to [facility] already.  Patient is motivated to improve how she takes care of herself.  Patient states she is looking forward to getting of the hospital.  Patient did not express any othe concerns or issues.  Patient expressed she is interested in home health PT, CSW told her because of her insurance it will be limited on which company can accept her.  CSW provided choice and she said whichever agency will take insurance will be fine.  CSW spoke to [place] [place], and she said she can accept home health PT for patient.  If patient needs home health nursing, she can make arrangements for patient to receive it at a later time.  Patient would benefit from heart failure protocol and patient is agreeable to it, [facility] can provide heart failure protocol.  Patient did not express any difficulties getting her medication or getting to MD appointments.  Patient will need Eliquis coupon for  discharge.   Expected Discharge Plan: [place] Barriers to Discharge: Continued Medical Work up   Patient Goals and CMS Choice Patient states their goals for this hospitalization and ongoing recovery are:: To return back home with home health PT. CMS Medicare.gov Compare Post Acute Care list provided to:: Patient Choice offered to / list presented to : Patient, Adult Children  Expected Discharge Plan and Services Expected Discharge Plan: [place] In-house Referral: Clinical Social Work   Post Acute Care Choice: [place] arrangements for the past 2 months: Apartment                 DME Arranged: N/A DME Agency: NA       HH Arranged: PT HH Agency: [place]   Time [place]: 1300 Representative spoke with at [place]: [place] Arrangements/Services Living arrangements for the past 2 months: [place] with:: Self Patient language and need for interpreter reviewed:: Yes Do you feel safe going back to the place where you live?: Yes      Need for Family Participation in Patient Care: Yes (Comment) Care giver support system in place?: Yes (comment) Current home services: Other (comment)([employer] Medicare nurse sees her once a month.) Criminal Activity/Legal Involvement Pertinent to Current Situation/Hospitalization: No - Comment as needed  Activities of Daily Living Home Assistive Devices/Equipment:  Cane (specify quad or straight)(quad) ADL Screening (condition at time of admission) Patient's cognitive ability adequate to safely complete daily activities?: Yes Is the patient deaf or have difficulty hearing?: No Does the patient have difficulty seeing, even when wearing glasses/contacts?: No Does the patient have difficulty concentrating, remembering, or making decisions?: No Patient able to express need for assistance with ADLs?: Yes Does the patient have difficulty dressing or  bathing?: No Independently performs ADLs?: Yes (appropriate for developmental age) Does the patient have difficulty walking or climbing stairs?: No Weakness of Legs: None Weakness of Arms/Hands: None  Permission Sought/Granted Permission sought to share information with : Family Supports, Case Manager Permission granted to share information with : Yes, Verbal Permission Granted  Share Information with NAME: Dajana, Gehrig) Son   [phone] or Kyasia, Steuck   [phone]  Permission granted to share info w AGENCY: Home Health Agencies        Emotional Assessment Appearance:: Appears stated age Attitude/Demeanor/Rapport: Engaged Affect (typically observed): Appropriate, Stable, Pleasant, Calm Orientation: : Oriented to Self, Oriented to Place, Oriented to  Time, Oriented to Situation Alcohol / Substance Use: Not Applicable Psych Involvement: No (comment)  Admission diagnosis:  Acute systolic congestive heart failure (HCC) [I50.21] Atrial fibrillation with rapid ventricular response (HCC) [I48.91] Patient Active Problem List   Diagnosis Date Noted  . Acute congestive heart failure (HCC) 03/17/2019  . Acute gastroenteritis 07/05/2017  . Acute respiratory failure (HCC) 03/27/2011   PCP:  Mickel Fuchs, MD Pharmacy:   [facility] [place], [place] - [address] [place] [address] Phone: [phone] Fax: [phone]     Social Determinants of Health (SDOH) Interventions    Readmission Risk Interventions No flowsheet data found.

## 2019-03-18 NOTE — Progress Notes (Signed)
*  PRELIMINARY RESULTS* Echocardiogram 2D Echocardiogram has been performed.  [name] 03/18/2019, 9:10 AM

## 2019-03-18 NOTE — Plan of Care (Signed)
Nutrition Education Note  RD consulted for nutrition education regarding CHF.  73 y.o. female with history of paroxysmal atrial fibrillation, severe mitral regurgitation mitral valve prolapse with severe posterior leaflet prolapse who presented to the emergency room complaining of shortness of breath.  Spoke with patient via phone. Pt reports good appetite and oral intake pta and today; pt eating 100% of meals. Pt reports that she does not add any salt to her food at home. Pt does report however eating a lot of frozen meals, hot pockets and canned soups. Pt orders her groceries online and her son and daughter pick them up for her. RD educated pt today regarding a low sodium diet. Focused mainly on limiting excess salt by purchasing lower sodium soups, purchasing frozen meals that are low or reduced sodium (Healthy choice, etc), eliminating hot pockets and trying to eat microwave able frozen vegetables when able.   RD provided "Low Sodium Nutrition Therapy" handout from the Academy of Nutrition and Dietetics. Reviewed patient's dietary recall. Provided examples on ways to decrease sodium intake in diet. Discouraged intake of processed foods and use of salt shaker. Encouraged fresh fruits and vegetables as well as whole grain sources of carbohydrates to maximize fiber intake.   RD discussed why it is important for patient to adhere to diet recommendations, and emphasized the role of fluids, foods to avoid, and importance of weighing self daily. Teach back method used.  Expect fair to poor compliance.  Body mass index is 24.91 kg/m. Pt meets criteria for normal weight based on current BMI.  Current diet order is 2 gram sodium, patient is consuming approximately 100% of meals at this time. Labs and medications reviewed. No further nutrition interventions warranted at this time. RD contact information provided. If additional nutrition issues arise, please re-consult RD.   Koleen Distance MS, RD,  LDN Pager #- [phone] Office#- [phone] After Hours Pager: [phone]

## 2019-03-18 NOTE — Progress Notes (Signed)
[facility] at [place] NAME: [name]    MR#:  [id number]  DATE OF BIRTH:  Feb 29, 1946  SUBJECTIVE:  CHIEF COMPLAINT:   Chief Complaint  Patient presents with  . Shortness of Breath  . Weakness   Came with worsening generalized weakness and shortness of breath on exertion.  Also noted to have CHF.  Feels better after having diuresis today. REVIEW OF SYSTEMS:  CONSTITUTIONAL: No fever, fatigue or weakness.  EYES: No blurred or double vision.  EARS, NOSE, AND THROAT: No tinnitus or ear pain.  RESPIRATORY: No cough, shortness of breath, wheezing or hemoptysis.  CARDIOVASCULAR: No chest pain, have orthopnea, edema.  GASTROINTESTINAL: No nausea, vomiting, diarrhea or abdominal pain.  GENITOURINARY: No dysuria, hematuria.  ENDOCRINE: No polyuria, nocturia,  HEMATOLOGY: No anemia, easy bruising or bleeding SKIN: No rash or lesion. MUSCULOSKELETAL: No joint pain or arthritis.   NEUROLOGIC: No tingling, numbness, weakness.  PSYCHIATRY: No anxiety or depression.   ROS  DRUG ALLERGIES:   Allergies  Allergen Reactions  . Prednisone Hives  . Predicort [Prednisolone Acetate]     HIVES,    VITALS:  Blood pressure 105/73, pulse (!) 112, temperature 98 F (36.7 C), temperature source Oral, resp. rate 19, height 5\' 5"  (1.651 m), weight 67.9 kg, SpO2 95 %.  PHYSICAL EXAMINATION:  GENERAL:  73 y.o.-year-old patient lying in the bed with no acute distress.  EYES: Pupils equal, round, reactive to light and accommodation. No scleral icterus. Extraocular muscles intact.  HEENT: Head atraumatic, normocephalic. Oropharynx and nasopharynx clear.  NECK:  Supple, no jugular venous distention. No thyroid enlargement, no tenderness.  LUNGS: Normal breath sounds bilaterally, no wheezing, have bilateral some crepitation. No use of accessory muscles of respiration.  CARDIOVASCULAR: S1, S2 normal. No murmurs, rubs, or gallops.  ABDOMEN: Soft, nontender,  nondistended. Bowel sounds present. No organomegaly or mass.  EXTREMITIES: No pedal edema, cyanosis, or clubbing.  NEUROLOGIC: Cranial nerves II through XII are intact. Muscle strength 5/5 in all extremities. Sensation intact. Gait not checked.  PSYCHIATRIC: The patient is alert and oriented x 3.  SKIN: No obvious rash, lesion, or ulcer.   Physical Exam LABORATORY PANEL:   CBC Recent Labs  Lab 03/17/19 1230  WBC 11.4*  HGB 13.0  HCT 39.3  PLT 228   ------------------------------------------------------------------------------------------------------------------  Chemistries  Recent Labs  Lab 03/18/19 0413  NA 138  K 3.7  CL 111  CO2 23  GLUCOSE 107*  BUN 12  CREATININE 0.78  CALCIUM 8.5*   ------------------------------------------------------------------------------------------------------------------  Cardiac Enzymes No results for input(s): TROPONINI in the last 168 hours. ------------------------------------------------------------------------------------------------------------------  RADIOLOGY:  Dg Chest 2 View  Result Date: 03/17/2019 CLINICAL DATA:  Shortness of breath for 2 weeks. EXAM: CHEST - 2 VIEW COMPARISON:  08/28/2011 FINDINGS: Mild cardiomegaly is seen with increased heart size since prior study. New diffuse interstitial infiltrates are seen, consistent with interstitial edema. Bibasilar atelectasis or infiltrates are also new since previous study. IMPRESSION: Acute congestive heart failure. New bibasilar atelectasis or infiltrates; superimposed pneumonia cannot be excluded. Electronically Signed   By: Marlaine Hind M.D.   On: 03/17/2019 12:55    ASSESSMENT AND PLAN:   Active Problems:   Acute congestive heart failure (HCC)  Acute on chronic congestive heart failure with mid-range EF- Last ECHO 08/12/18 with EF 45-50% and severe mitral regurgitation. Follows with Dr. Saralyn Pilar as an outpatient. -Lasix 40mg  IV daily -Check ECHO [facility]  consult appreciated. -Hold ACE inhibitor because of  borderline blood pressure currently. -Strict I/O, daily weights-patient lost almost 2 L fluid so far.  Paroxysmal atrial fibrillation with RVR- HRs remains in the 130s s/p IV diltiazem and IV lopressor. CHADSVASc is a 7. -Started eliquis - diltiazem gtt -Checked TSH-normal. -Cardiac monitoring -Switch to oral Cardizem.  Leukocytosis- likely reactive. No signs of infection. -Checked- normal procalcitonin to rule out pneumonia -Recheck CBC in the morning  Hypervolemic hyponatremia- due to volume overload -Lasix as above  History of stroke -Continue home aspirin -Checked lipid panel-LDL 92.  Hypothyroidism- stable -Continue home synthroid -Checked TSH  Hyperglycemia- no diagnosis of diabetes -Check A1c- 5.2      All the records are reviewed and case discussed with Care Management/Social Workerr. Management plans discussed with the patient, family and they are in agreement.  CODE STATUS: Full code.  TOTAL TIME TAKING CARE OF THIS PATIENT: *35 minutes.     POSSIBLE D/C IN 1-2 DAYS, DEPENDING ON CLINICAL CONDITION.   Altamese Dilling M.D on 03/18/2019   Between 7am to 6pm - Pager - [phone]  After 6pm go to [email or web address] - password [email or web address]  [place] [facility]  Office  [phone]  CC: Primary care physician; Mickel Fuchs, MD  Note: This dictation was prepared with Dragon dictation along with smaller phrase technology. Any transcriptional errors that result from this process are unintentional.

## 2019-03-18 NOTE — Progress Notes (Signed)
Ch visited with pt to complete OR for prayer. Pt is a/o and has a positive affect and no longer c/o being SOB. Pt currently is being treated for fluid and has a hx of Afib/IBS/heart murmur. Ch was present while pt was pt was being informed of her medications that she will be taking. Ch allowed space for the pt to give a life review and reflect on the challenges that she had to over come regarding her professional and spiritual development over the years. The ch asked guided questions regarding the pt's Catholic roots and being born-again. 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 Ch prayed at bedside with the pt and recited Job 33:4.  No further needs at this time.       03/18/19 1000  Clinical Encounter Type  Visited With Health care provider;Patient and family together;Other (Comment) (clergy)  Visit Type Psychological support;Spiritual support;Social support  Referral From Physician  Consult/Referral To Chaplain  Spiritual Encounters  Spiritual Needs Prayer;Emotional;Grief support;Sacred text  Stress Factors  Patient Stress Factors Health changes;Loss of control;Major life changes  Family Stress Factors None identified

## 2019-03-18 NOTE — Progress Notes (Signed)
PT Cancellation Note  Patient Details Name: [name] MRN: [id number] DOB: 1945/12/28   Cancelled Treatment:    Reason Eval/Treat Not Completed: Patient not medically ready.  PT consult received.  Chart reviewed.  Pt's HR noted to be in a-fib rhythm with HR fluctuating between 108-132 bpm on telemetry monitor.  Pt noted to have been started on cardizem drip 10/15 at 1837.  Per PT guidelines, will hold PT at this time until HR more stabilized for participation in therapeutic activity (discussed with pt's nurse).  Will re-attempt PT evaluation at a later date/time.  Leitha Bleak, PT 03/18/19, 10:21 AM [phone]

## 2019-03-18 NOTE — Progress Notes (Signed)
Patient admitted for A-fib with RVR, was started on Cardizem gtt now on PO - current HR 107.

## 2019-03-19 LAB — BASIC METABOLIC PANEL
Anion gap: 11 (ref 5–15)
BUN: 14 mg/dL (ref 8–23)
CO2: 23 mmol/L (ref 22–32)
Calcium: 8.7 mg/dL — ABNORMAL LOW (ref 8.9–10.3)
Chloride: 106 mmol/L (ref 98–111)
Creatinine, Ser: 0.88 mg/dL (ref 0.44–1.00)
GFR calc Af Amer: >60 mL/min (ref >60)
GFR calc non Af Amer: >60 mL/min (ref >60)
Glucose, Bld: 104 mg/dL — ABNORMAL HIGH (ref 70–99)
Potassium: 3.7 mmol/L (ref 3.5–5.1)
Sodium: 140 mmol/L (ref 135–145)

## 2019-03-19 MED ORDER — POTASSIUM CHLORIDE CRYS ER 20 MEQ PO TBCR
20.0000 meq | EXTENDED_RELEASE_TABLET | Freq: Two times a day (BID) | ORAL | Status: DC
  Administered 2019-03-19 – 2019-03-20 (×3): 20 meq via ORAL
  Filled 2019-03-19 (×4): qty 1

## 2019-03-19 MED ORDER — DILTIAZEM HCL ER COATED BEADS 120 MG PO CP24
240.0000 mg | ORAL_CAPSULE | Freq: Every day | ORAL | Status: DC
  Administered 2019-03-19: 240 mg via ORAL
  Filled 2019-03-19: qty 2

## 2019-03-19 MED ORDER — METOPROLOL SUCCINATE ER 25 MG PO TB24
25.0000 mg | ORAL_TABLET | Freq: Every day | ORAL | Status: DC
  Administered 2019-03-19 – 2019-03-20 (×2): 25 mg via ORAL
  Filled 2019-03-19 (×2): qty 1

## 2019-03-19 MED ORDER — DILTIAZEM HCL ER COATED BEADS 120 MG PO CP24
240.0000 mg | ORAL_CAPSULE | Freq: Every day | ORAL | Status: DC
  Administered 2019-03-20: 240 mg via ORAL
  Filled 2019-03-19: qty 2

## 2019-03-19 MED ORDER — DILTIAZEM HCL ER COATED BEADS 180 MG PO CP24: 300.0000 mg | ORAL_CAPSULE | Freq: Every day | ORAL | Status: DC

## 2019-03-19 NOTE — Progress Notes (Signed)
Patient Name: [name] Date of Encounter: 03/19/2019  Hospital Problem List     Active Problems:   Acute congestive heart failure [facility])    Patient Profile     73 year old female with history of severe mitral regurgitation due to prolapse who was admitted with A. fib with RVR.  Rate is improved although still remains somewhat rapid.  She is currently on diltiazem 240 mg along with Eliquis for anticoagulation.  Rate still remains in the low 100s and she is hemodynamically stable.  Subjective   Feels better anxious to go home  Inpatient Medications    . apixaban  5 mg Oral BID  . aspirin EC  81 mg Oral Daily  . calcium-vitamin D  1 tablet Oral BID  . [START ON 03/20/2019] diltiazem  300 mg Oral Daily  . docusate sodium  100 mg Oral BID  . folic acid  1 mg Oral Daily  . levothyroxine  50 mcg Oral Q0600  . pantoprazole  40 mg Oral Daily  . sodium chloride flush  3 mL Intravenous Once  . sodium chloride flush  3 mL Intravenous Q12H    Vital Signs    Vitals:   03/18/19 2041 03/18/19 2043 03/19/19 0506 03/19/19 0741  BP:  108/74 131/85 127/89  Pulse: 88 (!) 112 (!) 108 (!) 108  Resp:    20  Temp:   97.7 F (36.5 C) 98.1 F (36.7 C)  TempSrc:   Oral Oral  SpO2: 96% 96% 95% 93%  Weight:   66.6 kg   Height:        Intake/Output Summary (Last 24 hours) at 03/19/2019 1043 Last data filed at 03/19/2019 0900 Gross per 24 hour  Intake 1320 ml  Output 4100 ml  Net -2780 ml   Filed Weights   03/17/19 2011 03/18/19 0254 03/19/19 0506  Weight: 68.4 kg 67.9 kg 66.6 kg    Physical Exam    GEN: Well nourished, well developed, in no acute distress.  HEENT: normal.  Neck: Supple, no JVD, carotid bruits, or masses. Cardiac: Irregular irregular rhythm 2/6 systolic murmur Respiratory:  Respirations regular and unlabored, clear to auscultation bilaterally. GI: Soft, nontender, nondistended, BS + x 4. MS: no deformity or atrophy. Skin: warm and dry, no rash. Neuro:   Strength and sensation are intact. Psych: Normal affect.  Labs    CBC Recent Labs    03/17/19 1230  WBC 11.4*  HGB 13.0  HCT 39.3  MCV 82.2  PLT 228   Basic Metabolic Panel Recent Labs    86/57/84 0413 03/19/19 0629  NA 138 140  K 3.7 3.7  CL 111 106  CO2 23 23  GLUCOSE 107* 104*  BUN 12 14  CREATININE 0.78 0.88  CALCIUM 8.5* 8.7*   Liver Function Tests No results for input(s): AST, ALT, ALKPHOS, BILITOT, PROT, ALBUMIN in the last 72 hours. No results for input(s): LIPASE, AMYLASE in the last 72 hours. Cardiac Enzymes No results for input(s): CKTOTAL, CKMB, CKMBINDEX, TROPONINI in the last 72 hours. BNP Recent Labs    03/17/19 1230  BNP 1,070.0*   D-Dimer No results for input(s): DDIMER in the last 72 hours. Hemoglobin A1C Recent Labs    03/18/19 0413  HGBA1C 5.2   Fasting Lipid Panel Recent Labs    03/17/19 2054  CHOL 159  HDL 46  LDLCALC 92  TRIG 103  CHOLHDL 3.5   Thyroid Function Tests Recent Labs    03/18/19 0413  TSH 2.652  Telemetry    Atrial fibrillation with variable but somewhat rapid ventricular response  ECG    A. fib with rapid ventricular response  Radiology    Dg Chest 2 View  Result Date: 03/17/2019 CLINICAL DATA:  Shortness of breath for 2 weeks. EXAM: CHEST - 2 VIEW COMPARISON:  08/28/2011 FINDINGS: Mild cardiomegaly is seen with increased heart size since prior study. New diffuse interstitial infiltrates are seen, consistent with interstitial edema. Bibasilar atelectasis or infiltrates are also new since previous study. IMPRESSION: Acute congestive heart failure. New bibasilar atelectasis or infiltrates; superimposed pneumonia cannot be excluded. Electronically Signed   By: Marlaine Hind M.D.   On: 03/17/2019 12:55    Assessment & Plan    Atrial fibrillation-rate is still not well controlled.  She was on metoprolol succinate 25 mg daily as an outpatient.  She currently is on Cardizem 240 mg daily.  We will add  metoprolol succinate 25 mg back following hemodynamics and heart rate.  No intervention on her mitral valve at present.  Will need to optimize her A. fib control and consider further intervention in the future.  Mitral valve disease-continue with current regimen.  Consider valvular intervention in the future as an outpatient.  Signed, Javier Docker Daymein Nunnery MD 03/19/2019, 10:43 AM  Pager: [phone]

## 2019-03-19 NOTE — Progress Notes (Signed)
PT Cancellation Note  Patient Details Name: [name] MRN: [id number] DOB: [birth year]   Cancelled Treatment:    Reason Eval/Treat Not Completed: Patient not medically ready, HR continues to be above 100 BPM.   Alanson Puls, PT DPT 03/19/2019, 9:26 AM

## 2019-03-19 NOTE — Progress Notes (Signed)
[employer] [facility] [place] [facility]   PATIENT NAME: [name]    MR#:  [id number]  DATE OF BIRTH:  [birth year]  SUBJECTIVE:  CHIEF COMPLAINT:   Chief Complaint  Patient presents with  . Shortness of Breath  . Weakness   Came with worsening generalized weakness and shortness of breath on exertion.  Also noted to have CHF.  Had a lot of diuresis in last 2 days.  She had some cramping in her foot yesterday.  She had tachycardia and rate controlling medications doses changed today.  REVIEW OF SYSTEMS:  CONSTITUTIONAL: No fever, fatigue or weakness.  EYES: No blurred or double vision.  EARS, NOSE, AND THROAT: No tinnitus or ear pain.  RESPIRATORY: No cough, shortness of breath, wheezing or hemoptysis.  CARDIOVASCULAR: No chest pain, have orthopnea, edema.  GASTROINTESTINAL: No nausea, vomiting, diarrhea or abdominal pain.  GENITOURINARY: No dysuria, hematuria.  ENDOCRINE: No polyuria, nocturia,  HEMATOLOGY: No anemia, easy bruising or bleeding SKIN: No rash or lesion. MUSCULOSKELETAL: No joint pain or arthritis.   NEUROLOGIC: No tingling, numbness, weakness.  PSYCHIATRY: No anxiety or depression.   ROS  DRUG ALLERGIES:   Allergies  Allergen Reactions  . Prednisone Hives  . Predicort [Prednisolone Acetate]     HIVES,    VITALS:  Blood pressure 127/89, pulse (!) 108, temperature 98.1 F (36.7 C), temperature source Oral, resp. rate 20, height 5\' 5"  (1.651 m), weight 66.6 kg, SpO2 93 %.  PHYSICAL EXAMINATION:  GENERAL:  73 y.o.-year-old patient lying in the bed with no acute distress.  EYES: Pupils equal, round, reactive to light and accommodation. No scleral icterus. Extraocular muscles intact.  HEENT: Head atraumatic, normocephalic. Oropharynx and nasopharynx clear.  NECK:  Supple, no jugular venous distention. No thyroid enlargement, no tenderness.  LUNGS: Normal breath sounds bilaterally, no wheezing, have bilateral some crepitation. No use of  accessory muscles of respiration.  CARDIOVASCULAR: S1, S2 normal. No murmurs, rubs, or gallops.  ABDOMEN: Soft, nontender, nondistended. Bowel sounds present. No organomegaly or mass.  EXTREMITIES: No pedal edema, cyanosis, or clubbing.  NEUROLOGIC: Cranial nerves II through XII are intact. Muscle strength 5/5 in all extremities. Sensation intact. Gait not checked.  PSYCHIATRIC: The patient is alert and oriented x 3.  SKIN: No obvious rash, lesion, or ulcer.   Physical Exam LABORATORY PANEL:   CBC Recent Labs  Lab 03/17/19 1230  WBC 11.4*  HGB 13.0  HCT 39.3  PLT 228   ------------------------------------------------------------------------------------------------------------------  Chemistries  Recent Labs  Lab 03/19/19 0629  NA 140  K 3.7  CL 106  CO2 23  GLUCOSE 104*  BUN 14  CREATININE 0.88  CALCIUM 8.7*   ------------------------------------------------------------------------------------------------------------------  Cardiac Enzymes No results for input(s): TROPONINI in the last 168 hours. ------------------------------------------------------------------------------------------------------------------  RADIOLOGY:  No results found.  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 Follows with Dr. 10/12/18 as an outpatient. -Lasix 40mg  IV daily -Check ECHO -[facility] Cardiology consult appreciated. -Hold ACE inhibitor because of borderline blood pressure currently. -Strict I/O, daily weights-patient lost almost 2 L fluid so far.  Paroxysmal atrial fibrillation with RVR- HRs remains in the 130s s/p IV diltiazem and IV lopressor. CHADSVASc is a 7. -Started eliquis - diltiazem gtt-changed to oral Cardizem. -Checked TSH-normal. -Cardiac monitoring -Switch to oral Cardizem.  Added metoprolol per  cardiologist.  Leukocytosis- likely reactive.  No signs of infection. -Checked- normal procalcitonin to rule out pneumonia -Recheck CBC in the morning  Hypervolemic hyponatremia- due to volume overload -Lasix as above  History of stroke -Continue home aspirin -Checked lipid panel-LDL 92.  Hypothyroidism- stable -Continue home synthroid -Checked TSH  Hyperglycemia- no diagnosis of diabetes -Check A1c- 5.2      All the records are reviewed and case discussed with Care Management/Social Workerr. Management plans discussed with the patient, family and they are in agreement.  CODE STATUS: Full code.  TOTAL TIME TAKING CARE OF THIS PATIENT: *35 minutes.     POSSIBLE D/C IN 1-2 DAYS, DEPENDING ON CLINICAL CONDITION.   Vaughan Basta M.D on 03/19/2019   Between 7am to 6pm - Pager - [phone]  After 6pm go to [email or web address] - password [email or web address] [place] [facility]  Office  [phone]  CC: Primary care physician; Elba Barman, MD  Note: This dictation was prepared with Dragon dictation along with smaller phrase technology. Any transcriptional errors that result from this process are unintentional.

## 2019-03-19 NOTE — Plan of Care (Signed)
  Problem: Cardiac: Goal: Ability to achieve and maintain adequate cardiopulmonary perfusion will improve Outcome: Not Progressing Note: Continues to have episodes of RVR.  Cardiologist adjusted cardiac medication for better control of HR.   Problem: Cardiac: Goal: Ability to achieve and maintain adequate cardiopulmonary perfusion will improve Outcome: Not Progressing    Problem: Education: Goal: Knowledge of General Education information will improve Description: Including pain rating scale, medication(s)/side effects and non-pharmacologic comfort measures Outcome: Progressing   Problem: Health Behavior/Discharge Planning: Goal: Ability to manage health-related needs will improve Outcome: Progressing   Problem: Activity: Goal: Risk for activity intolerance will decrease Outcome: Progressing   Problem: Safety: Goal: Ability to remain free from injury will improve Outcome: Progressing   Problem: Education: Goal: Knowledge of disease or condition will improve Outcome: Progressing Goal: Understanding of medication regimen will improve Outcome: Progressing Goal: Individualized Educational Video(s) Outcome: Progressing   Problem: Activity: Goal: Ability to tolerate increased activity will improve Outcome: Progressing   Problem: Health Behavior/Discharge Planning: Goal: Ability to safely manage health-related needs after discharge will improve Outcome: Progressing   Problem: Education: Goal: Ability to demonstrate management of disease process will improve Outcome: Progressing Goal: Ability to verbalize understanding of medication therapies will improve Outcome: Progressing Goal: Individualized Educational Video(s) Outcome: Progressing   Problem: Activity: Goal: Capacity to carry out activities will improve Outcome: Progressing

## 2019-03-19 NOTE — Progress Notes (Signed)
Pt's son, Broadus John, updated regarding pt's status and the plan of care.

## 2019-03-20 LAB — CBC
HCT: 40.8 % (ref 36.0–46.0)
Hemoglobin: 13.2 g/dL (ref 12.0–15.0)
MCH: 26.7 pg (ref 26.0–34.0)
MCHC: 32.4 g/dL (ref 30.0–36.0)
MCV: 82.6 fL (ref 80.0–100.0)
Platelets: 231 10*3/uL (ref 150–400)
RBC: 4.94 MIL/uL (ref 3.87–5.11)
RDW: 14.4 % (ref 11.5–15.5)
WBC: 9.2 10*3/uL (ref 4.0–10.5)
nRBC: 0 % (ref 0.0–0.2)

## 2019-03-20 LAB — BASIC METABOLIC PANEL
Anion gap: 9 (ref 5–15)
BUN: 15 mg/dL (ref 8–23)
CO2: 23 mmol/L (ref 22–32)
Calcium: 9.2 mg/dL (ref 8.9–10.3)
Chloride: 106 mmol/L (ref 98–111)
Creatinine, Ser: 0.85 mg/dL (ref 0.44–1.00)
GFR calc Af Amer: >60 mL/min (ref >60)
GFR calc non Af Amer: >60 mL/min (ref >60)
Glucose, Bld: 110 mg/dL — ABNORMAL HIGH (ref 70–99)
Potassium: 4.3 mmol/L (ref 3.5–5.1)
Sodium: 138 mmol/L (ref 135–145)

## 2019-03-20 MED ORDER — DILTIAZEM HCL ER COATED BEADS 240 MG PO CP24: 240.0000 mg | ORAL_CAPSULE | Freq: Every day | ORAL | 0 refills | Status: DC

## 2019-03-20 MED ORDER — APIXABAN 5 MG PO TABS: 5.0000 mg | ORAL_TABLET | Freq: Two times a day (BID) | ORAL | 0 refills | Status: AC

## 2019-03-20 NOTE — Progress Notes (Signed)
   03/20/19 1000  Clinical Encounter Type  Visited With Patient  Visit Type Follow-up;Spiritual support  Referral From Nurse  Spiritual Encounters  Spiritual Needs Prayer;Emotional  Stress Factors  Patient Stress Factors Health changes  Family Stress Factors Health changes  Ch received an OR for prayer request. Ch and pt together read the Daily Bread and read the Scripture. Pt shared about her close relationship with God and how she became a born-again. Ch provided a caring, attentive presence while pt shared various testimonies. Pt requested a prayer so ch prayed with the pt. Pt also asked for  a bible and ch provided one.

## 2019-03-20 NOTE — Discharge Summary (Signed)
Helper at [place] NAME: Brittlyn Cloe    MR#:  [id number]  DATE OF BIRTH:  05/01/1946  DATE OF ADMISSION:  03/17/2019 ADMITTING PHYSICIAN: Sela Hua, MD  DATE OF DISCHARGE: 03/20/2019   PRIMARY CARE PHYSICIAN: Elba Barman, MD    ADMISSION DIAGNOSIS:  Acute systolic congestive heart failure (HCC) [I50.21] Atrial fibrillation with rapid ventricular response ([place]) [I48.91]  DISCHARGE DIAGNOSIS:  Active Problems:   Acute congestive heart failure ([place])   SECONDARY DIAGNOSIS:   Past Medical History:  Diagnosis Date  . Acute respiratory failure ([place])    Secondary to aspiration pneumonia   . Altered mental status    Secondary to viral herpes simplex virus encephalitis  . Anemia   . Atrial fibrillation ([place])   . Bilateral pneumonia   . Dysphasia   . Encephalitis   . Hyperglycemia   . Hypertension   . Hyponatremia   . Seizures ([place])   . Stroke [facility])     HOSPITAL COURSE:   Acute on chronic congestive heart failure with mid-range EF- Last ECHO 08/12/18 with EF 45-50% and severe mitral regurgitation. Follows with Dr. Saralyn Pilar as an outpatient. -Lasix 40mg  IV daily -Check ECHO -[facility]. -Hold ACE inhibitor because of borderline blood pressure currently. -Strict I/O, daily weights-patient lost almost 4 L fluid so far.  Paroxysmal atrial fibrillation with RVR- HRsremainsin the 130s s/p IV diltiazem and IV lopressor. CHADSVASc is a 7. -Started eliquis - diltiazem gtt-changed to oral Cardizem. -Checked TSH-normal. -Cardiac monitoring -Switch to oral Cardizem. Added metoprolol per cardiologist.\ -Advised to follow in cardiology clinic in 1 week.  Leukocytosis- likely reactive. No signs of infection. -Checked- normal procalcitonin to rule out pneumonia -Recheck CBC in the morning - came down.  Hypervolemic hyponatremia- due to volume overload -Lasix as above  History of  stroke -Continue home aspirin -Checked lipid panel- LDL 92.  Hypothyroidism- stable -Continue home synthroid -Checked TSH  Hyperglycemia- no diagnosis of diabetes -Check A1c- 5.2  We are arranging for home health and physical therapy and visiting nurse at home upon discharge.  Discussed with her son and daughter agreed with the plan.  DISCHARGE CONDITIONS:   Stable.  CONSULTS OBTAINED:  Treatment Team:  Teodoro Spray, MD  DRUG ALLERGIES:   Allergies  Allergen Reactions  . Prednisone Hives  . Predicort [Prednisolone Acetate]     HIVES,    DISCHARGE MEDICATIONS:   Allergies as of 03/20/2019      Reactions   Prednisone Hives   Predicort [prednisolone Acetate]    HIVES,      Medication List    TAKE these medications   acetaminophen 325 MG tablet Commonly known as: TYLENOL Take 650 mg by mouth every 6 (six) hours as needed. Thru G tube   apixaban 5 MG Tabs tablet Commonly known as: ELIQUIS Take 1 tablet (5 mg total) by mouth 2 (two) times daily.   aspirin 81 MG tablet Take 81 mg by mouth daily.   calcium-vitamin D 500-200 MG-UNIT tablet Commonly known as: OSCAL WITH D Take 1 tablet by mouth 2 (two) times daily.   calcium-vitamin D 500-200 MG-UNIT tablet Take 1 tablet by mouth 2 (two) times daily.   diltiazem 240 MG 24 hr capsule Commonly known as: CARDIZEM CD Take 1 capsule (240 mg total) by mouth daily. Start taking on: March 21, 2019 What changed:  medication strength how much to take   docusate sodium 100 MG capsule Commonly known  as: COLACE Take 100 mg by mouth 2 (two) times daily.   eucerin cream Apply topically as needed.   folic acid 1 MG tablet Commonly known as: FOLVITE Take 1 mg by mouth daily. Per tube once daily   guaiFENesin 600 MG 12 hr tablet Commonly known as: MUCINEX Take 600 mg by mouth 2 (two) times daily.   levothyroxine 75 MCG tablet Commonly known as: SYNTHROID Take 75 mcg by mouth daily before breakfast.    meclizine 12.5 MG tablet Commonly known as: ANTIVERT Take 1 tablet (12.5 mg total) by mouth 2 (two) times daily as needed for dizziness.   metoprolol succinate 25 MG 24 hr tablet Commonly known as: TOPROL-XL Take 25 mg by mouth daily.   omeprazole 20 MG capsule Commonly known as: PRILOSEC Take 20 mg by mouth daily.   Osmolite Liqd Take by mouth. 40 mL per hour        DISCHARGE INSTRUCTIONS:    Follow with Cardiology clinic in 1-2 weeks.  If you experience worsening of your admission symptoms, develop shortness of breath, life threatening emergency, suicidal or homicidal thoughts you must seek medical attention immediately by calling 911 or calling your MD immediately  if symptoms less severe.  You Must read complete instructions/literature along with all the possible adverse reactions/side effects for all the Medicines you take and that have been prescribed to you. Take any new Medicines after you have completely understood and accept all the possible adverse reactions/side effects.   Please note  You were cared for by a hospitalist during your hospital stay. If you have any questions about your discharge medications or the care you received while you were in the hospital after you are discharged, you can call the unit and asked to speak with the hospitalist on call if the hospitalist that took care of you is not available. Once you are discharged, your primary care physician will handle any further medical issues. Please note that NO REFILLS for any discharge medications will be authorized once you are discharged, as it is imperative that you return to your primary care physician (or establish a relationship with a primary care physician if you do not have one) for your aftercare needs so that they can reassess your need for medications and monitor your lab values.    Today   CHIEF COMPLAINT:   Chief Complaint  Patient presents with  . Shortness of Breath  . Weakness     HISTORY OF PRESENT ILLNESS:  Alvan DameMaxine Krack  is a 73 y.o. female with a known history of atrial fibrillation, hypertension, seizures, stroke who presented to the ED with progressively worsening shortness of breath over the last couple of weeks.  She also endorses some generalized weakness" leg heaviness".  She denies any lower extremity edema.  No cough, fevers, or chills.  She endorses orthopnea.  She does not take a fluid pill at home.  She denies any chest pain or palpitations.  In the ED, tachycardic with heart rates in the 130s and tachypneic with respiratory rates in the 30s.  Labs are significant for sodium 132, BNP 1070, WBC 11.4.  Chest x-ray with acute congestive heart failure.  Hospitalists were called for admission.   VITAL SIGNS:  Blood pressure 129/90, pulse (!) 103, temperature 98.6 F (37 C), temperature source Oral, resp. rate 16, height 5\' 5"  (1.651 m), weight 67.7 kg, SpO2 95 %.  I/O:    Intake/Output Summary (Last 24 hours) at 03/20/2019 1409 Last data filed at  03/20/2019 1306 Gross per 24 hour  Intake 240 ml  Output 3300 ml  Net -3060 ml    PHYSICAL EXAMINATION:   GENERAL:  73 y.o.-year-old patient lying in the bed with no acute distress.  EYES: Pupils equal, round, reactive to light and accommodation. No scleral icterus. Extraocular muscles intact.  HEENT: Head atraumatic, normocephalic. Oropharynx and nasopharynx clear.  NECK:  Supple, no jugular venous distention. No thyroid enlargement, no tenderness.  LUNGS: Normal breath sounds bilaterally, no wheezing, have bilateral some crepitation. No use of accessory muscles of respiration.  CARDIOVASCULAR: S1, S2 normal. No murmurs, rubs, or gallops.  ABDOMEN: Soft, nontender, nondistended. Bowel sounds present. No organomegaly or mass.  EXTREMITIES: No pedal edema, cyanosis, or clubbing.  NEUROLOGIC: Cranial nerves II through XII are intact. Muscle strength 5/5 in all extremities. Sensation intact. Gait not  checked.  PSYCHIATRIC: The patient is alert and oriented x 3.  SKIN: No obvious rash, lesion, or ulcer.   DATA REVIEW:   CBC Recent Labs  Lab 03/20/19 0545  WBC 9.2  HGB 13.2  HCT 40.8  PLT 231    Chemistries  Recent Labs  Lab 03/20/19 0545  NA 138  K 4.3  CL 106  CO2 23  GLUCOSE 110*  BUN 15  CREATININE 0.85  CALCIUM 9.2    Cardiac Enzymes No results for input(s): TROPONINI in the last 168 hours.  Microbiology Results  Results for orders placed or performed during the hospital encounter of 03/17/19  SARS CORONAVIRUS 2 (TAT 6-24 HRS) Nasopharyngeal Nasopharyngeal Swab     Status: None   Collection Time: 03/17/19  2:03 PM   Specimen: Nasopharyngeal Swab  Result Value Ref Range Status   SARS Coronavirus 2 NEGATIVE NEGATIVE Final    Comment: (NOTE) SARS-CoV-2 target nucleic acids are NOT DETECTED. The SARS-CoV-2 RNA is generally detectable in upper and lower respiratory specimens during the acute phase of infection. Negative results do not preclude SARS-CoV-2 infection, do not rule out co-infections with other pathogens, and should not be used as the sole basis for treatment or other patient management decisions. Negative results must be combined with clinical observations, patient history, and epidemiological information. The expected result is Negative. Fact Sheet for Patients: HairSlick.no Fact Sheet for Healthcare Providers: quierodirigir.com This test is not yet approved or cleared by the [place] FDA and  has been authorized for detection and/or diagnosis of SARS-CoV-2 by FDA under an Emergency Use Authorization (EUA). This EUA will remain  in effect (meaning this test can be used) for the duration of the COVID-19 declaration under Section 56 4(b)(1) of the Act, 21 U.S.C. section 360bbb-3(b)(1), unless the authorization is terminated or revoked sooner. Performed at [facility], [address], [place], [place] [address]     RADIOLOGY:  No results found.  EKG:   Orders placed or performed during the hospital encounter of 03/17/19  . ED EKG  . ED EKG  . EKG 12-Lead  . EKG 12-Lead    Management plans discussed with the patient, family and they are in agreement.  CODE STATUS:     Code Status Orders  (From admission, onward)         Start     Ordered   03/17/19 2020  Full code  Continuous     03/17/19 2019        Code Status History    Date Active Date Inactive Code Status Order ID Comments User Context   07/05/2017 0148 07/07/2017 1630  Full Code [id number]  Ihor Austin, MD Inpatient   Advance Care Planning Activity    Advance Directive Documentation     Most Recent Value  Type of Advance Directive  Healthcare Power of Attorney  Pre-existing out of facility DNR order (yellow form or pink MOST form)  -  "MOST" Form in Place?  -      TOTAL TIME TAKING CARE OF THIS PATIENT: 35 minutes.    Altamese Dilling M.D on 03/20/2019 at 2:09 PM  Between 7am to 6pm - Pager - [phone]  After 6pm go to [email or web address] - password [email or web address]  [facility] [place] [facility]  Office  [phone]  CC: Primary care physician; Mickel Fuchs, MD   Note: This dictation was prepared with Dragon dictation along with smaller phrase technology. Any transcriptional errors that result from this process are unintentional.

## 2019-03-20 NOTE — TOC Transition Note (Signed)
Transition of Care [facility]) - CM/SW Discharge Note   Patient Details  Name: LUDMILA EBARB MRN: [id number] Date of Birth: 1945/12/11  Transition of Care [facility]) CM/SW Contact:  Marykathleen Russi, Lenice Llamas Phone Number: [phone]  03/20/2019, 3:03 PM   Clinical Narrative: Clinical Social Worker (CSW) notified Verda Cumins home health agency representative that patient will D/C home today with PT, RN and aide. CSW provided patient with Eliquis coupon. Patient is aware of above. Please reconsult if future social work needs arise. CSW signing off.      Final next level of care: [place] Barriers to Discharge: Barriers Resolved   Patient Goals and CMS Choice Patient states their goals for this hospitalization and ongoing recovery are:: To go home. CMS Medicare.gov Compare Post Acute Care list provided to:: Patient Choice offered to / list presented to : Patient, Adult Children  Discharge Placement                       Discharge Plan and Services In-house Referral: Clinical Social Work   Post Acute Care Choice: Home Health          DME Arranged: N/A DME Agency: NA       HH Arranged: PT, RN, Nurse's Aide [place] Agency: [place] Date [place]: 03/20/19 Time HH Agency Contacted: 12 Representative spoke with at [place]: [place] Determinants of Health ([place]) Interventions     Readmission Risk Interventions No flowsheet data found.

## 2019-03-20 NOTE — Progress Notes (Signed)
PT Cancellation Note  Patient Details Name: [name] MRN: [id number] DOB: 1945-09-14   Cancelled Treatment:    Reason Eval/Treat Not Completed: Patient not medically ready. Per PT guidelines, will hold PT at this time until HR more stabilized for participation in therapeutic activity.   [address], [place] , [place] DPT 03/20/2019, 2:30 PM

## 2019-03-20 NOTE — Plan of Care (Signed)
  Problem: Education: Goal: Knowledge of General Education information will improve Description: Including pain rating scale, medication(s)/side effects and non-pharmacologic comfort measures Outcome: Progressing   Problem: Activity: Goal: Risk for activity intolerance will decrease Outcome: Progressing   Problem: Safety: Goal: Ability to remain free from injury will improve Outcome: Progressing   Problem: Activity: Goal: Ability to tolerate increased activity will improve Outcome: Progressing

## 2019-03-20 NOTE — Consult Note (Signed)
Patient Name: [name] Date of Encounter: 03/20/2019  Hospital Problem List     Active Problems:   Acute congestive heart failure [facility])    Patient Profile     73 year old female with history of severe mitral regurgitation due to prolapse who was admitted with A. fib with RVR.  Rate is improved although still remains somewhat rapid.  She is currently on diltiazem 240 mg as well as metoprolol 25 mg along with Eliquis for anticoagulation.  Rate still remains in the low 100s and she is hemodynamically stable.  Patient wishes to go home.  Subjective   Heart rate still somewhat increased with rates in the low 100s.  Patient is breathing back to her baseline.  She desires to go home.  Inpatient Medications    . apixaban  5 mg Oral BID  . aspirin EC  81 mg Oral Daily  . calcium-vitamin D  1 tablet Oral BID  . diltiazem  240 mg Oral Daily  . docusate sodium  100 mg Oral BID  . folic acid  1 mg Oral Daily  . levothyroxine  50 mcg Oral Q0600  . metoprolol succinate  25 mg Oral Daily  . pantoprazole  40 mg Oral Daily  . potassium chloride  20 mEq Oral BID  . sodium chloride flush  3 mL Intravenous Once  . sodium chloride flush  3 mL Intravenous Q12H    Vital Signs    Vitals:   03/19/19 1735 03/19/19 1920 03/20/19 0406 03/20/19 0809  BP: 117/81 125/88 (!) 116/93 129/90  Pulse: 76 63 64 (!) 103  Resp:   17 16  Temp: 97.9 F (36.6 C) 98.6 F (37 C) 97.9 F (36.6 C) 98.6 F (37 C)  TempSrc: Oral Oral Oral Oral  SpO2: 96% 97% 92% 95%  Weight:   67.7 kg   Height:        Intake/Output Summary (Last 24 hours) at 03/20/2019 1405 Last data filed at 03/20/2019 1306 Gross per 24 hour  Intake 240 ml  Output 3300 ml  Net -3060 ml   Filed Weights   03/18/19 0254 03/19/19 0506 03/20/19 0406  Weight: 67.9 kg 66.6 kg 67.7 kg    Physical Exam    GEN: Well nourished, well developed, in no acute distress.  HEENT: normal.  Neck: Supple, no JVD, carotid bruits, or  masses. Cardiac: Irregular irregular rhythm with a 2/6 systolic murmur rating towards the left lower sternal border Respiratory:  Respirations regular and unlabored, clear to auscultation bilaterally. GI: Soft, nontender, nondistended, BS + x 4. MS: no deformity or atrophy. Skin: warm and dry, no rash. Neuro:  Strength and sensation are intact. Psych: Normal affect.  Labs    CBC Recent Labs    03/20/19 0545  WBC 9.2  HGB 13.2  HCT 40.8  MCV 82.6  PLT 409   Basic Metabolic Panel Recent Labs    03/19/19 0629 03/20/19 0545  NA 140 138  K 3.7 4.3  CL 106 106  CO2 23 23  GLUCOSE 104* 110*  BUN 14 15  CREATININE 0.88 0.85  CALCIUM 8.7* 9.2   Liver Function Tests No results for input(s): AST, ALT, ALKPHOS, BILITOT, PROT, ALBUMIN in the last 72 hours. No results for input(s): LIPASE, AMYLASE in the last 72 hours. Cardiac Enzymes No results for input(s): CKTOTAL, CKMB, CKMBINDEX, TROPONINI in the last 72 hours. BNP No results for input(s): BNP in the last 72 hours. D-Dimer No results for input(s): DDIMER in  the last 72 hours. Hemoglobin A1C Recent Labs    03/18/19 0413  HGBA1C 5.2   Fasting Lipid Panel Recent Labs    03/17/19 2054  CHOL 159  HDL 46  LDLCALC 92  TRIG 103  CHOLHDL 3.5   Thyroid Function Tests Recent Labs    03/18/19 0413  TSH 2.652    Telemetry    Atrial fibrillation with variable ventricular response  ECG    Atrial fibrillation with rapid ventricular response  Radiology    Dg Chest 2 View  Result Date: 03/17/2019 CLINICAL DATA:  Shortness of breath for 2 weeks. EXAM: CHEST - 2 VIEW COMPARISON:  08/28/2011 FINDINGS: Mild cardiomegaly is seen with increased heart size since prior study. New diffuse interstitial infiltrates are seen, consistent with interstitial edema. Bibasilar atelectasis or infiltrates are also new since previous study. IMPRESSION: Acute congestive heart failure. New bibasilar atelectasis or infiltrates;  superimposed pneumonia cannot be excluded. Electronically Signed   By: Danae Orleans M.D.   On: 03/17/2019 12:55    Assessment & Plan    Atrial fibrillation-rate moderately well controlled with Cardizem and metoprolol and anticoagulated with apixaban.  She is asymptomatic with this at present.  Okay for discharge with early outpatient follow-up with Dr. Darrold Junker  in 1 week.  Is hemodynamically stable  Mitral valve disease-moderate to severe MR.  Patient has been hesitant to consider intervention.  This will need to be discussed as an outpatient.  No evidence of further failure.  Would discharge with follow-up as an outpatient.  Signed, Darlin Priestly Zemira Zehring MD 03/20/2019, 2:05 PM  Pager: [phone]

## 2019-03-20 NOTE — Progress Notes (Signed)
She has not had a bowel movement since Thursday.  As she has IBS, she is reluctant to take anything for it.

## 2019-03-30 NOTE — Progress Notes (Signed)
ina  Patient ID: [name], female    DOB: 11/28/1945, 73 y.o.   MRN: [id number]  HPI  Ms Josepha PiggHatley is a 73 y/o female with a history of HTN, stroke, anemia, seizures, atrial fibrillation and heart failure.   Echo report from 03/18/2019 reviewed and showed an EF of 60-65% along with moderate/severe MR, trivial TR and moderately elevated PA pressure.   Admitted 03/17/2019 due to acute on chronic HF. Cardiology consult obtained. Initially needed IV lasix and then transitioned to oral diuretics. Discharged after 3 days.   She presents today for her initial visit with a chief complaint of minimal shortness of breath upon moderate exertion. She describes this as chronic in nature having been present for several years. She has associated fatigue, rhinorrhea and morning dizziness along with this. She denies any difficulty sleeping, abdominal distention, palpitations, pedal edema, chest pain, cough or weight gain.   Weighing daily at home and son is assisting with monitoring sodium intake. Lifealert is going to be set up next week.   Past Medical History:  Diagnosis Date  . Acute respiratory failure (HCC)    Secondary to aspiration pneumonia   . Altered mental status    Secondary to viral herpes simplex virus encephalitis  . Anemia   . Atrial fibrillation (HCC)   . Bilateral pneumonia   . CHF (congestive heart failure) (HCC)   . Dysphasia   . Encephalitis   . Hyperglycemia   . Hypertension   . Hyponatremia   . Seizures (HCC)   . Stroke [facility](HCC)    Past Surgical History:  Procedure Laterality Date  . CHOLECYSTECTOMY    . COLONOSCOPY WITH PROPOFOL N/A 12/31/2016   Procedure: COLONOSCOPY WITH PROPOFOL;  Surgeon: Scot JunElliott, Robert T, MD;  [facility];  Service: Endoscopy;  Laterality: N/A;  . ESOPHAGOGASTRODUODENOSCOPY (EGD) WITH PROPOFOL N/A 12/31/2016   Procedure: ESOPHAGOGASTRODUODENOSCOPY (EGD) WITH PROPOFOL;  Surgeon: Scot JunElliott, Robert T, MD;  [facility];  Service:  Endoscopy;  Laterality: N/A;  . TONSILLECTOMY    . TUBAL LIGATION     Family History  Problem Relation Age of Onset  . Prostate cancer Father   . Breast cancer Maternal Aunt   . Breast cancer Maternal Aunt   . Cerebral palsy Son    Social History   Tobacco Use  . Smoking status: Never Smoker  . Smokeless tobacco: Never Used  Substance Use Topics  . Alcohol use: No   Allergies  Allergen Reactions  . Prednisone Hives  . Predicort [Prednisolone Acetate]     HIVES,   Prior to Admission medications   Medication Sig Start Date End Date Taking? Authorizing Provider  acetaminophen (TYLENOL) 325 MG tablet Take 650 mg by mouth every 6 (six) hours as needed. Thru G tube   Yes [provider]  apixaban (ELIQUIS) 5 MG TABS tablet Take 1 tablet (5 mg total) by mouth 2 (two) times daily. 03/20/19  Yes Altamese DillingVachhani, Vaibhavkumar, MD  aspirin 81 MG tablet Take 81 mg by mouth daily.     Yes [provider]  Calcium Carb-Cholecalciferol (CALCIUM-VITAMIN D) 500-200 MG-UNIT tablet Take 1 tablet by mouth 2 (two) times daily.   Yes [provider]  calcium-vitamin D (OSCAL WITH D) 500-200 MG-UNIT tablet Take 1 tablet by mouth 2 (two) times daily.   Yes [provider]  diltiazem (CARDIZEM CD) 240 MG 24 hr capsule Take 1 capsule (240 mg total) by mouth daily. 03/21/19  Yes Altamese DillingVachhani, Vaibhavkumar, MD  folic acid Alvira Monday(FOLVITE)  1 MG tablet Take 1 mg by mouth daily. Per tube once daily     Yes [provider]  furosemide (LASIX) 20 MG tablet Take 20 mg by mouth daily.   Yes [provider]  guaiFENesin (MUCINEX) 600 MG 12 hr tablet Take 600 mg by mouth 2 (two) times daily.   Yes [provider]  levothyroxine (SYNTHROID) 75 MCG tablet Take 75 mcg by mouth daily before breakfast.    Yes [provider]  meclizine (ANTIVERT) 12.5 MG tablet Take 1 tablet (12.5 mg total) by mouth 2 (two) times daily as needed for dizziness. 01/13/18  Yes Merlyn Lot, MD  metoprolol succinate (TOPROL-XL) 25 MG 24 hr tablet Take 25 mg by mouth daily. 03/05/19  Yes [provider]  Nutritional Supplements (OSMOLITE) LIQD Take by mouth. 40 mL per hour    Yes [provider]  omeprazole (PRILOSEC) 20 MG capsule Take 20 mg by mouth daily.   Yes [provider]  potassium chloride (KLOR-CON) 10 MEQ tablet Take 10 mEq by mouth daily.   Yes [provider]  docusate sodium (COLACE) 100 MG capsule Take 100 mg by mouth 2 (two) times daily.    [provider]  Skin Protectants, Misc. (EUCERIN) cream Apply topically as needed.      [provider]     Review of Systems  Constitutional: Positive for fatigue. Negative for appetite change.  HENT: Positive for rhinorrhea. Negative for congestion and sore throat.   Eyes: Negative.   Respiratory: Positive for shortness of breath (with moderate exertion). Negative for cough.   Cardiovascular: Negative for chest pain, palpitations and leg swelling.  Gastrointestinal: Negative for abdominal distention and abdominal pain.  Endocrine: Negative.   Genitourinary: Negative.   Musculoskeletal: Negative for back pain and neck pain.  Skin: Negative.   Allergic/Immunologic: Negative.   Neurological: Positive for dizziness (in the morning).  Hematological: Negative for adenopathy. Does not bruise/bleed easily.  Psychiatric/Behavioral: Negative for dysphoric mood and sleep disturbance (sleeping on 2 pillows). The patient is not nervous/anxious.     Vitals:   03/31/19 0916  BP: 109/74  Pulse: 90  Resp: 16  SpO2: 100%  Weight: 141 lb (64 kg)  Height: 5\' 5"  (1.651 m)   Wt Readings from Last 3 Encounters:  03/31/19 141 lb (64 kg)  03/20/19 149 lb 4.8 oz (67.7 kg)  01/13/18 140 lb (63.5 kg)   Lab Results  Component Value Date   CREATININE 0.85 03/20/2019   CREATININE 0.88 03/19/2019   CREATININE 0.78 03/18/2019     Physical Exam Vitals signs and nursing  note reviewed.  Constitutional:      Appearance: Normal appearance.  HENT:     Head: Normocephalic and atraumatic.  Neck:     Musculoskeletal: Normal range of motion and neck supple.  Cardiovascular:     Rate and Rhythm: Normal rate and regular rhythm.     Heart sounds: Murmur (2/6) present.  Pulmonary:     Effort: Pulmonary effort is normal. No respiratory distress.     Breath sounds: No wheezing or rales.  Abdominal:     General: There is no distension.     Palpations: Abdomen is soft.     Tenderness: There is no abdominal tenderness.  Musculoskeletal:        General: No tenderness.     Right lower leg: No edema.     Left lower leg: No edema.  Skin:    General: Skin is warm  and dry.  Neurological:     General: No focal deficit present.     Mental Status: She is alert and oriented to person, place, and time.  Psychiatric:        Mood and Affect: Mood normal.        Behavior: Behavior normal.        Thought Content: Thought content normal.     Assessment & Plan:  1: Chronic heart failure with preserved ejection fraction- - NYHA class II - euvolemic today - already weighing daily and home weight chart was reviewed.; reminded to call for an overnight weight gain of >2 pounds or a weekly weight gain of >5 pounds - not adding salt and son and patient have been reading food labels for sodium content. She had been eating a lot of frozen meals because with her short-term memory issues, she doesn't want to use the oven. Son has found lower sodium choices for her. Reviewed the importance of keeping daily sodium intake to 2000mg  / day and written dietary information was given to her about this - currently has PT and home health nursing coming in - BNP 03/17/2019 was 1070.0 - has received her flu vaccine and shingles vaccine for this year  2: HTN- - BP looks good today - sees PCP (Wroth @ [facility]) - BMP 03/20/2019 reviewed and showed sodium 138,  potassium 4.3, creatinine 0.85 and GFR >60  3: Paroxymal atrial fibrillation- - needed IV diltiazem and lopressor and then changed to oral medications - saw cardiology Mellissa Kohut) 03/22/2019  Medication list and bottles were reviewed.   Return in 6 weeks or sooner for any questions/problems before then.

## 2019-03-31 ENCOUNTER — Encounter: Payer: Self-pay | Admitting: Family

## 2019-03-31 ENCOUNTER — Other Ambulatory Visit: Payer: Self-pay

## 2019-03-31 ENCOUNTER — Ambulatory Visit: Payer: Medicare Other | Attending: Family | Admitting: Family

## 2019-03-31 VITALS — BP 109/74 | HR 90 | Resp 16 | Ht 65.0 in | Wt 141.0 lb

## 2019-03-31 DIAGNOSIS — I480 Paroxysmal atrial fibrillation: Secondary | ICD-10-CM | POA: Insufficient documentation

## 2019-03-31 DIAGNOSIS — I110 Hypertensive heart disease with heart failure: Secondary | ICD-10-CM | POA: Insufficient documentation

## 2019-03-31 DIAGNOSIS — Z79899 Other long term (current) drug therapy: Secondary | ICD-10-CM | POA: Insufficient documentation

## 2019-03-31 DIAGNOSIS — Z7982 Long term (current) use of aspirin: Secondary | ICD-10-CM | POA: Insufficient documentation

## 2019-03-31 DIAGNOSIS — I5032 Chronic diastolic (congestive) heart failure: Secondary | ICD-10-CM | POA: Diagnosis not present

## 2019-03-31 DIAGNOSIS — R5383 Other fatigue: Secondary | ICD-10-CM | POA: Diagnosis not present

## 2019-03-31 DIAGNOSIS — R42 Dizziness and giddiness: Secondary | ICD-10-CM | POA: Insufficient documentation

## 2019-03-31 DIAGNOSIS — Z7901 Long term (current) use of anticoagulants: Secondary | ICD-10-CM | POA: Diagnosis not present

## 2019-03-31 DIAGNOSIS — R0602 Shortness of breath: Secondary | ICD-10-CM | POA: Insufficient documentation

## 2019-03-31 DIAGNOSIS — Z8673 Personal history of transient ischemic attack (TIA), and cerebral infarction without residual deficits: Secondary | ICD-10-CM | POA: Diagnosis not present

## 2019-03-31 DIAGNOSIS — J3489 Other specified disorders of nose and nasal sinuses: Secondary | ICD-10-CM | POA: Insufficient documentation

## 2019-03-31 DIAGNOSIS — I10 Essential (primary) hypertension: Secondary | ICD-10-CM

## 2019-03-31 NOTE — Patient Instructions (Signed)
Continue weighing daily and call for an overnight weight gain of > 2 pounds or a weekly weight gain of >5 pounds. 

## 2019-05-11 NOTE — Progress Notes (Signed)
Patient ID: [name], female    DOB: 12/19/1945, 73 y.o.   MRN: [id number]  HPI  [name] is a 73 y/o female with a history of HTN, stroke, anemia, seizures, atrial fibrillation and heart failure.   Echo report from 03/18/2019 reviewed and showed an EF of 60-65% along with moderate/severe MR, trivial TR and moderately elevated PA pressure.   Admitted 03/17/2019 due to acute on chronic HF. Cardiology consult obtained. Initially needed IV lasix and then transitioned to oral diuretics. Discharged after 3 days.   She presents today for a follow-up visit with a chief complaint of minimal shortness of breath upon moderate exertion. She describes this as chronic in nature having been present in several years. She has associated fatigue, slight nosebleed and dizziness along with this. She denies any difficulty sleeping, abdominal distention, palpitations, pedal edema, chest pain, cough or weight gain.   Past Medical History:  Diagnosis Date  . Acute respiratory failure (HCC)    Secondary to aspiration pneumonia   . Altered mental status    Secondary to viral herpes simplex virus encephalitis  . Anemia   . Atrial fibrillation (HCC)   . Bilateral pneumonia   . CHF (congestive heart failure) (HCC)   . Dysphasia   . Encephalitis   . Hyperglycemia   . Hypertension   . Hyponatremia   . Seizures (HCC)   . Stroke [facility](HCC)    Past Surgical History:  Procedure Laterality Date  . CHOLECYSTECTOMY    . COLONOSCOPY WITH PROPOFOL N/A 12/31/2016   Procedure: COLONOSCOPY WITH PROPOFOL;  Surgeon: Scot JunElliott, Robert T, MD;  [facility];  Service: Endoscopy;  Laterality: N/A;  . ESOPHAGOGASTRODUODENOSCOPY (EGD) WITH PROPOFOL N/A 12/31/2016   Procedure: ESOPHAGOGASTRODUODENOSCOPY (EGD) WITH PROPOFOL;  Surgeon: Scot JunElliott, Robert T, MD;  [facility];  Service: Endoscopy;  Laterality: N/A;  . TONSILLECTOMY    . TUBAL LIGATION     Family History  Problem Relation Age of Onset  .  Prostate cancer Father   . Breast cancer Maternal Aunt   . Breast cancer Maternal Aunt   . Cerebral palsy Son    Social History   Tobacco Use  . Smoking status: Never Smoker  . Smokeless tobacco: Never Used  Substance Use Topics  . Alcohol use: No   Allergies  Allergen Reactions  . Prednisone Hives  . Predicort [Prednisolone Acetate]     HIVES,    Prior to Admission medications   Medication Sig Start Date End Date Taking? Authorizing Provider  apixaban (ELIQUIS) 5 MG TABS tablet Take 1 tablet (5 mg total) by mouth 2 (two) times daily. 03/20/19  Yes Altamese DillingVachhani, Vaibhavkumar, MD  aspirin 81 MG tablet Take 81 mg by mouth daily.     Yes [provider]  Calcium Carb-Cholecalciferol (CALCIUM-VITAMIN D) 500-200 MG-UNIT tablet Take 1 tablet by mouth 2 (two) times daily.   Yes [provider]  calcium-vitamin D (OSCAL WITH D) 500-200 MG-UNIT tablet Take 1 tablet by mouth 2 (two) times daily.   Yes [provider]  diltiazem (CARDIZEM CD) 240 MG 24 hr capsule Take 1 capsule (240 mg total) by mouth daily. 03/21/19  Yes Altamese DillingVachhani, Vaibhavkumar, MD  folic acid (FOLVITE) 1 MG tablet Take 1 mg by mouth daily. Per tube once daily     Yes [provider]  furosemide (LASIX) 20 MG tablet Take 20 mg by mouth daily.   Yes [provider]  guaiFENesin (MUCINEX) 600 MG 12 hr tablet Take 600  mg by mouth 2 (two) times daily.   Yes [provider]  levothyroxine (SYNTHROID) 75 MCG tablet Take 75 mcg by mouth daily before breakfast.    Yes [provider]  Loperamide HCl (IMODIUM PO) Take by mouth as needed.   Yes [provider]  meclizine (ANTIVERT) 12.5 MG tablet Take 1 tablet (12.5 mg total) by mouth 2 (two) times daily as needed for dizziness. 01/13/18  Yes Merlyn Lot, MD  metoprolol succinate (TOPROL-XL) 25 MG 24 hr tablet Take 25 mg by mouth daily. 03/05/19  Yes [provider]  omeprazole (PRILOSEC) 20 MG capsule  Take 20 mg by mouth daily.   Yes [provider]  potassium chloride (KLOR-CON) 10 MEQ tablet Take 10 mEq by mouth daily.   Yes [provider]     Review of Systems  Constitutional: Positive for fatigue. Negative for appetite change.  HENT: Positive for nosebleeds (slight in nares) and rhinorrhea. Negative for congestion and sore throat.   Eyes: Negative.   Respiratory: Positive for shortness of breath (with moderate exertion). Negative for cough.   Cardiovascular: Negative for chest pain, palpitations and leg swelling.  Gastrointestinal: Negative for abdominal distention and abdominal pain.  Endocrine: Negative.   Genitourinary: Negative.   Musculoskeletal: Negative for back pain and neck pain.  Skin: Negative.   Allergic/Immunologic: Negative.   Neurological: Positive for dizziness (if BP gets low). Negative for light-headedness.  Hematological: Negative for adenopathy. Does not bruise/bleed easily.  Psychiatric/Behavioral: Negative for dysphoric mood and sleep disturbance (sleeping on 2 pillows). The patient is not nervous/anxious.    Vitals:   05/12/19 1333  BP: 108/71  Pulse: 93  Resp: 16  SpO2: 100%  Weight: 147 lb 6.4 oz (66.9 kg)  Height: 5\' 5"  (1.651 m)   Wt Readings from Last 3 Encounters:  05/12/19 147 lb 6.4 oz (66.9 kg)  03/31/19 141 lb (64 kg)  03/20/19 149 lb 4.8 oz (67.7 kg)   Lab Results  Component Value Date   CREATININE 0.85 03/20/2019   CREATININE 0.88 03/19/2019   CREATININE 0.78 03/18/2019    Physical Exam Vitals signs and nursing note reviewed.  Constitutional:      Appearance: Normal appearance.  HENT:     Head: Normocephalic and atraumatic.  Neck:     Musculoskeletal: Normal range of motion and neck supple.  Cardiovascular:     Rate and Rhythm: Normal rate and regular rhythm.     Heart sounds: Murmur (2/6) present.  Pulmonary:     Effort: Pulmonary effort is normal. No respiratory distress.     Breath sounds: No  wheezing or rales.  Abdominal:     General: There is no distension.     Palpations: Abdomen is soft.     Tenderness: There is no abdominal tenderness.  Musculoskeletal:        General: No tenderness.     Right lower leg: No edema.     Left lower leg: No edema.  Skin:    General: Skin is warm and dry.  Neurological:     General: No focal deficit present.     Mental Status: She is alert and oriented to person, place, and time.  Psychiatric:        Mood and Affect: Mood normal.        Behavior: Behavior normal.        Thought Content: Thought content normal.     Assessment & Plan:  1: Chronic heart failure with preserved ejection fraction- -  NYHA class II - euvolemic today - weighing daily and home weight chart was reviewed.; reminded to call for an overnight weight gain of >2 pounds or a weekly weight gain of >5 pounds - weight up 6 pounds from last visit here 1 month ago - not adding salt and son and patient have been reading food labels for sodium content. She had been eating a lot of frozen meals because with her short-term memory issues, she doesn't want to use the oven. Son has found lower sodium choices for her.  - continues to have PT and home health nursing coming in - BNP 03/17/2019 was 1070.0 - has received her flu vaccine and shingles vaccine for this year  2: HTN- - BP looks good today - follows with PCP (Wroth @ [facility]) - BMP 04/26/2019 reviewed and showed sodium 133, potassium 4.5, creatinine 0.8 and GFR 70  3: Paroxymal atrial fibrillation- - on apixaban, diltiazem & metoprolol succinate - saw cardiology Mellissa Kohut) 04/26/2019  4: Nosebleed- - patient says that she's had "slight blood tinged" drainage from her nose after blowing it - explained that with her being on the apixaban and possibly drying our her nasal passages with turning her heat on may be contributing to it - should she develop any sort of clots, she was instructed to  call her cardiology office   Patient did not bring her medications nor a list. Each medication was verbally reviewed with the patient and she was encouraged to bring the bottles to every visit to confirm accuracy of list.   Will not make a return appointment for patient at this time. Advised her that she could call back at anytime to make another appointment. Patient was comfortable with this plan.

## 2019-05-12 ENCOUNTER — Ambulatory Visit: Payer: Medicare Other | Attending: Family | Admitting: Family

## 2019-05-12 ENCOUNTER — Other Ambulatory Visit: Payer: Self-pay

## 2019-05-12 ENCOUNTER — Encounter: Payer: Self-pay | Admitting: Family

## 2019-05-12 VITALS — BP 108/71 | HR 93 | Resp 16 | Ht 65.0 in | Wt 147.4 lb

## 2019-05-12 DIAGNOSIS — Z79890 Hormone replacement therapy (postmenopausal): Secondary | ICD-10-CM | POA: Diagnosis not present

## 2019-05-12 DIAGNOSIS — R040 Epistaxis: Secondary | ICD-10-CM | POA: Diagnosis not present

## 2019-05-12 DIAGNOSIS — Z79899 Other long term (current) drug therapy: Secondary | ICD-10-CM | POA: Diagnosis not present

## 2019-05-12 DIAGNOSIS — Z8673 Personal history of transient ischemic attack (TIA), and cerebral infarction without residual deficits: Secondary | ICD-10-CM | POA: Insufficient documentation

## 2019-05-12 DIAGNOSIS — Z888 Allergy status to other drugs, medicaments and biological substances status: Secondary | ICD-10-CM | POA: Insufficient documentation

## 2019-05-12 DIAGNOSIS — Z7982 Long term (current) use of aspirin: Secondary | ICD-10-CM | POA: Insufficient documentation

## 2019-05-12 DIAGNOSIS — I480 Paroxysmal atrial fibrillation: Secondary | ICD-10-CM | POA: Diagnosis not present

## 2019-05-12 DIAGNOSIS — I110 Hypertensive heart disease with heart failure: Secondary | ICD-10-CM | POA: Diagnosis not present

## 2019-05-12 DIAGNOSIS — I5032 Chronic diastolic (congestive) heart failure: Secondary | ICD-10-CM | POA: Diagnosis not present

## 2019-05-12 DIAGNOSIS — Z7901 Long term (current) use of anticoagulants: Secondary | ICD-10-CM | POA: Insufficient documentation

## 2019-05-12 DIAGNOSIS — I509 Heart failure, unspecified: Secondary | ICD-10-CM | POA: Diagnosis present

## 2019-05-12 DIAGNOSIS — I10 Essential (primary) hypertension: Secondary | ICD-10-CM

## 2019-05-12 NOTE — Patient Instructions (Signed)
Continue weighing daily and call for an overnight weight gain of > 2 pounds or a weekly weight gain of >5 pounds. 

## 2019-12-04 ENCOUNTER — Other Ambulatory Visit: Payer: Self-pay

## 2019-12-04 ENCOUNTER — Inpatient Hospital Stay
Admission: EM | Admit: 2019-12-04 | Discharge: 2019-12-07 | DRG: 291 | Disposition: A | Discharge status: Home Health | Payer: Medicare Other | Attending: Family Medicine | Admitting: Family Medicine

## 2019-12-04 ENCOUNTER — Encounter: Payer: Self-pay | Admitting: Emergency Medicine

## 2019-12-04 ENCOUNTER — Emergency Department: Payer: Medicare Other

## 2019-12-04 DIAGNOSIS — I4891 Unspecified atrial fibrillation: Secondary | ICD-10-CM | POA: Diagnosis present

## 2019-12-04 DIAGNOSIS — I083 Combined rheumatic disorders of mitral, aortic and tricuspid valves: Secondary | ICD-10-CM | POA: Diagnosis present

## 2019-12-04 DIAGNOSIS — I110 Hypertensive heart disease with heart failure: Secondary | ICD-10-CM | POA: Diagnosis not present

## 2019-12-04 DIAGNOSIS — Z8661 Personal history of infections of the central nervous system: Secondary | ICD-10-CM | POA: Diagnosis not present

## 2019-12-04 DIAGNOSIS — Z87891 Personal history of nicotine dependence: Secondary | ICD-10-CM

## 2019-12-04 DIAGNOSIS — Z8042 Family history of malignant neoplasm of prostate: Secondary | ICD-10-CM | POA: Diagnosis not present

## 2019-12-04 DIAGNOSIS — Z79899 Other long term (current) drug therapy: Secondary | ICD-10-CM | POA: Diagnosis not present

## 2019-12-04 DIAGNOSIS — Z803 Family history of malignant neoplasm of breast: Secondary | ICD-10-CM

## 2019-12-04 DIAGNOSIS — I480 Paroxysmal atrial fibrillation: Secondary | ICD-10-CM | POA: Diagnosis present

## 2019-12-04 DIAGNOSIS — Z20822 Contact with and (suspected) exposure to covid-19: Secondary | ICD-10-CM | POA: Diagnosis present

## 2019-12-04 DIAGNOSIS — I10 Essential (primary) hypertension: Secondary | ICD-10-CM | POA: Diagnosis present

## 2019-12-04 DIAGNOSIS — E039 Hypothyroidism, unspecified: Secondary | ICD-10-CM | POA: Diagnosis present

## 2019-12-04 DIAGNOSIS — I272 Pulmonary hypertension, unspecified: Secondary | ICD-10-CM | POA: Diagnosis present

## 2019-12-04 DIAGNOSIS — Z7901 Long term (current) use of anticoagulants: Secondary | ICD-10-CM

## 2019-12-04 DIAGNOSIS — J9621 Acute and chronic respiratory failure with hypoxia: Secondary | ICD-10-CM | POA: Diagnosis present

## 2019-12-04 DIAGNOSIS — K589 Irritable bowel syndrome without diarrhea: Secondary | ICD-10-CM | POA: Diagnosis present

## 2019-12-04 DIAGNOSIS — J9601 Acute respiratory failure with hypoxia: Secondary | ICD-10-CM | POA: Diagnosis present

## 2019-12-04 DIAGNOSIS — I5033 Acute on chronic diastolic (congestive) heart failure: Secondary | ICD-10-CM | POA: Diagnosis present

## 2019-12-04 DIAGNOSIS — I639 Cerebral infarction, unspecified: Secondary | ICD-10-CM | POA: Diagnosis present

## 2019-12-04 DIAGNOSIS — Z8673 Personal history of transient ischemic attack (TIA), and cerebral infarction without residual deficits: Secondary | ICD-10-CM | POA: Diagnosis not present

## 2019-12-04 DIAGNOSIS — I509 Heart failure, unspecified: Secondary | ICD-10-CM

## 2019-12-04 DIAGNOSIS — Z79890 Hormone replacement therapy (postmenopausal): Secondary | ICD-10-CM | POA: Diagnosis not present

## 2019-12-04 DIAGNOSIS — D649 Anemia, unspecified: Secondary | ICD-10-CM | POA: Diagnosis present

## 2019-12-04 LAB — COMPREHENSIVE METABOLIC PANEL
ALT: 33 U/L (ref 0–44)
AST: 45 U/L — ABNORMAL HIGH (ref 15–41)
Albumin: 3.2 g/dL — ABNORMAL LOW (ref 3.5–5.0)
Alkaline Phosphatase: 93 U/L (ref 38–126)
Anion gap: 8 (ref 5–15)
BUN: 14 mg/dL (ref 8–23)
CO2: 21 mmol/L — ABNORMAL LOW (ref 22–32)
Calcium: 8.2 mg/dL — ABNORMAL LOW (ref 8.9–10.3)
Chloride: 108 mmol/L (ref 98–111)
Creatinine, Ser: 0.86 mg/dL (ref 0.44–1.00)
GFR calc Af Amer: >60 mL/min (ref >60)
GFR calc non Af Amer: >60 mL/min (ref >60)
Glucose, Bld: 136 mg/dL — ABNORMAL HIGH (ref 70–99)
Potassium: 3.7 mmol/L (ref 3.5–5.1)
Sodium: 137 mmol/L (ref 135–145)
Total Bilirubin: 0.9 mg/dL (ref 0.3–1.2)
Total Protein: 6.6 g/dL (ref 6.5–8.1)

## 2019-12-04 LAB — CBC WITH DIFFERENTIAL/PLATELET
Abs Immature Granulocytes: 0.03 10*3/uL (ref 0.00–0.07)
Basophils Absolute: 0.1 10*3/uL (ref 0.0–0.1)
Basophils Relative: 1 %
Eosinophils Absolute: 0.1 10*3/uL (ref 0.0–0.5)
Eosinophils Relative: 1 %
HCT: 33.2 % — ABNORMAL LOW (ref 36.0–46.0)
Hemoglobin: 10.2 g/dL — ABNORMAL LOW (ref 12.0–15.0)
Immature Granulocytes: 0 %
Lymphocytes Relative: 16 %
Lymphs Abs: 1.5 10*3/uL (ref 0.7–4.0)
MCH: 21.9 pg — ABNORMAL LOW (ref 26.0–34.0)
MCHC: 30.7 g/dL (ref 30.0–36.0)
MCV: 71.4 fL — ABNORMAL LOW (ref 80.0–100.0)
Monocytes Absolute: 0.7 10*3/uL (ref 0.1–1.0)
Monocytes Relative: 7 %
Neutro Abs: 7.2 10*3/uL (ref 1.7–7.7)
Neutrophils Relative %: 75 %
Platelets: 266 10*3/uL (ref 150–400)
RBC: 4.65 MIL/uL (ref 3.87–5.11)
RDW: 16.6 % — ABNORMAL HIGH (ref 11.5–15.5)
WBC: 9.5 10*3/uL (ref 4.0–10.5)
nRBC: 0 % (ref 0.0–0.2)

## 2019-12-04 LAB — BRAIN NATRIURETIC PEPTIDE: B Natriuretic Peptide: 974.8 pg/mL — ABNORMAL HIGH (ref 0.0–100.0)

## 2019-12-04 LAB — SARS CORONAVIRUS 2 BY RT PCR (HOSPITAL ORDER, PERFORMED IN ~~LOC~~ HOSPITAL LAB): SARS Coronavirus 2: NEGATIVE

## 2019-12-04 LAB — TROPONIN I (HIGH SENSITIVITY): Troponin I (High Sensitivity): 7 ng/L (ref <18)

## 2019-12-04 MED ORDER — DILTIAZEM HCL ER COATED BEADS 120 MG PO CP24
240.0000 mg | ORAL_CAPSULE | Freq: Every day | ORAL | Status: DC
  Administered 2019-12-04 – 2019-12-07 (×4): 240 mg via ORAL
  Filled 2019-12-04 (×4): qty 2

## 2019-12-04 MED ORDER — POTASSIUM CHLORIDE CRYS ER 20 MEQ PO TBCR
40.0000 meq | EXTENDED_RELEASE_TABLET | Freq: Once | ORAL | Status: AC
  Administered 2019-12-04: 40 meq via ORAL
  Filled 2019-12-04: qty 2

## 2019-12-04 MED ORDER — FUROSEMIDE 10 MG/ML IJ SOLN
INTRAMUSCULAR | Status: AC
  Administered 2019-12-04: 20 mg via INTRAVENOUS
  Filled 2019-12-04: qty 4

## 2019-12-04 MED ORDER — POTASSIUM CHLORIDE CRYS ER 20 MEQ PO TBCR: 20.0000 meq | EXTENDED_RELEASE_TABLET | Freq: Once | ORAL | Status: DC

## 2019-12-04 MED ORDER — FUROSEMIDE 10 MG/ML IJ SOLN
20.0000 mg | Freq: Two times a day (BID) | INTRAMUSCULAR | Status: DC
  Administered 2019-12-04 (×2): 20 mg via INTRAVENOUS
  Filled 2019-12-04 (×2): qty 2

## 2019-12-04 MED ORDER — LEVOTHYROXINE SODIUM 50 MCG PO TABS
75.0000 ug | ORAL_TABLET | Freq: Every day | ORAL | Status: DC
  Administered 2019-12-05 – 2019-12-07 (×3): 75 ug via ORAL
  Filled 2019-12-04 (×3): qty 1

## 2019-12-04 MED ORDER — ONDANSETRON HCL 4 MG/2ML IJ SOLN: 4.0000 mg | Freq: Three times a day (TID) | INTRAMUSCULAR | Status: DC | PRN

## 2019-12-04 MED ORDER — SODIUM CHLORIDE 0.9 % IV SOLN: 250.0000 mL | INTRAVENOUS | Status: DC | PRN

## 2019-12-04 MED ORDER — HYDRALAZINE HCL 20 MG/ML IJ SOLN: 5.0000 mg | INTRAMUSCULAR | Status: DC | PRN

## 2019-12-04 MED ORDER — ALBUTEROL SULFATE (2.5 MG/3ML) 0.083% IN NEBU: 2.5000 mg | INHALATION_SOLUTION | RESPIRATORY_TRACT | Status: DC | PRN

## 2019-12-04 MED ORDER — SODIUM CHLORIDE 0.9% FLUSH
3.0000 mL | INTRAVENOUS | Status: DC | PRN
  Administered 2019-12-05: 3 mL via INTRAVENOUS

## 2019-12-04 MED ORDER — METOPROLOL SUCCINATE ER 25 MG PO TB24
25.0000 mg | ORAL_TABLET | Freq: Every day | ORAL | Status: DC
  Administered 2019-12-04 – 2019-12-07 (×4): 25 mg via ORAL
  Filled 2019-12-04 (×4): qty 1

## 2019-12-04 MED ORDER — DM-GUAIFENESIN ER 30-600 MG PO TB12
1.0000 | ORAL_TABLET | Freq: Two times a day (BID) | ORAL | Status: DC | PRN
  Administered 2019-12-05: 1 via ORAL
  Filled 2019-12-04: qty 1

## 2019-12-04 MED ORDER — CALCIUM CARBONATE-VITAMIN D 500-200 MG-UNIT PO TABS
1.0000 | ORAL_TABLET | Freq: Two times a day (BID) | ORAL | Status: DC
  Administered 2019-12-04 – 2019-12-07 (×6): 1 via ORAL
  Filled 2019-12-04 (×6): qty 1

## 2019-12-04 MED ORDER — FUROSEMIDE 10 MG/ML IJ SOLN: 20.0000 mg | Freq: Once | INTRAMUSCULAR | Status: AC

## 2019-12-04 MED ORDER — ACETAMINOPHEN 325 MG PO TABS
650.0000 mg | ORAL_TABLET | Freq: Four times a day (QID) | ORAL | Status: DC | PRN
  Administered 2019-12-05 – 2019-12-06 (×2): 650 mg via ORAL
  Filled 2019-12-04 (×2): qty 2

## 2019-12-04 MED ORDER — APIXABAN 5 MG PO TABS
5.0000 mg | ORAL_TABLET | Freq: Two times a day (BID) | ORAL | Status: DC
  Administered 2019-12-04 – 2019-12-07 (×6): 5 mg via ORAL
  Filled 2019-12-04 (×6): qty 1

## 2019-12-04 MED ORDER — PANTOPRAZOLE SODIUM 40 MG PO TBEC
40.0000 mg | DELAYED_RELEASE_TABLET | Freq: Every day | ORAL | Status: DC
  Administered 2019-12-04 – 2019-12-07 (×4): 40 mg via ORAL
  Filled 2019-12-04 (×4): qty 1

## 2019-12-04 MED ORDER — SODIUM CHLORIDE 0.9% FLUSH
3.0000 mL | Freq: Two times a day (BID) | INTRAVENOUS | Status: DC
  Administered 2019-12-04 – 2019-12-07 (×6): 3 mL via INTRAVENOUS

## 2019-12-04 MED ORDER — FOLIC ACID 1 MG PO TABS
1.0000 mg | ORAL_TABLET | Freq: Every day | ORAL | Status: DC
  Administered 2019-12-04 – 2019-12-07 (×4): 1 mg via ORAL
  Filled 2019-12-04 (×4): qty 1

## 2019-12-04 NOTE — ED Provider Notes (Signed)
[facility] Emergency Department Provider Note  ____________________________________________   First MD Initiated Contact with Patient 12/04/19 [phone]     (approximate)  I have reviewed the triage vital signs and the nursing notes.   HISTORY  Chief Complaint Shortness of Breath    HPI [name] is a 74 y.o. female with medical history as listed below which notably includes A. fib on Eliquis and CHF.  She presents for gradually worsening shortness of breath over the last 4 to 5 days.  Her shortness of breath is worse with lying flat and with exertion.  She says she can barely walk more than a few feet without losing her breath and she does not use oxygen at baseline.  She had a follow-up appointment with her cardiologist, Dr. Darrold Junker, about 4 days ago and she told him about the shortness of breath but at the time he felt that she was doing okay.  She says she has been compliant with her furosemide.  She is uncertain about weight gain but she has noticed swelling in her feet and legs.  She denies chest pain, abdominal pain (although she recently completed a course of antibiotics for diverticulitis), fever, nausea, vomiting, and dysuria.  She describes the dyspnea is severe and nothing in particular makes it better.         Past Medical History:  Diagnosis Date  . Acute respiratory failure (HCC)    Secondary to aspiration pneumonia   . Altered mental status    Secondary to viral herpes simplex virus encephalitis  . Anemia   . Atrial fibrillation (HCC)   . Bilateral pneumonia   . CHF (congestive heart failure) (HCC)   . Dysphasia   . Encephalitis   . Hyperglycemia   . Hypertension   . Hyponatremia   . Seizures (HCC)   . Stroke [facility])     Patient Active Problem List   Diagnosis Date Noted  . Acute CHF (congestive heart failure) (HCC) 12/04/2019  . Acute congestive heart failure (HCC) 03/17/2019  . Acute gastroenteritis 07/05/2017  . Acute  respiratory failure (HCC) 03/27/2011    Past Surgical History:  Procedure Laterality Date  . CHOLECYSTECTOMY    . COLONOSCOPY WITH PROPOFOL N/A 12/31/2016   Procedure: COLONOSCOPY WITH PROPOFOL;  Surgeon: Scot Jun, MD;  Location: [facility] ENDOSCOPY;  Service: Endoscopy;  Laterality: N/A;  . ESOPHAGOGASTRODUODENOSCOPY (EGD) WITH PROPOFOL N/A 12/31/2016   Procedure: ESOPHAGOGASTRODUODENOSCOPY (EGD) WITH PROPOFOL;  Surgeon: Scot Jun, MD;  Location: [facility] ENDOSCOPY;  Service: Endoscopy;  Laterality: N/A;  . TONSILLECTOMY    . TUBAL LIGATION      Prior to Admission medications   Medication Sig Start Date End Date Taking? Authorizing Provider  apixaban (ELIQUIS) 5 MG TABS tablet Take 1 tablet (5 mg total) by mouth 2 (two) times daily. 03/20/19  Yes Altamese Dilling, MD  Calcium Carb-Cholecalciferol (CALCIUM-VITAMIN D) 500-200 MG-UNIT tablet Take 1 tablet by mouth 2 (two) times daily.   Yes [provider]  diltiazem (CARDIZEM CD) 240 MG 24 hr capsule Take 1 capsule (240 mg total) by mouth daily. 03/21/19  Yes Altamese Dilling, MD  folic acid (FOLVITE) 1 MG tablet Take 1 mg by mouth daily. Per tube once daily     Yes [provider]  furosemide (LASIX) 20 MG tablet Take 20 mg by mouth daily.   Yes [provider]  guaiFENesin (MUCINEX) 600 MG 12 hr tablet Take 600 mg by mouth 2 (two) times daily.  Yes [provider]  levothyroxine (SYNTHROID) 75 MCG tablet Take 75 mcg by mouth daily before breakfast.    Yes [provider]  metoprolol succinate (TOPROL-XL) 25 MG 24 hr tablet Take 25 mg by mouth daily. 03/05/19  Yes [provider]  omeprazole (PRILOSEC) 20 MG capsule Take 20 mg by mouth daily.   Yes [provider]  potassium chloride (KLOR-CON) 10 MEQ tablet Take 10 mEq by mouth daily.   Yes [provider]  aspirin 81 MG tablet Take 81 mg by mouth daily.   Patient not taking: Reported on 12/04/2019     [provider]  Loperamide HCl (IMODIUM PO) Take by mouth as needed. Patient not taking: Reported on 12/04/2019    [provider]  meclizine (ANTIVERT) 12.5 MG tablet Take 1 tablet (12.5 mg total) by mouth 2 (two) times daily as needed for dizziness. Patient not taking: Reported on 12/04/2019 01/13/18   Willy Eddy, MD    Allergies Prednisone and Predicort [prednisolone acetate]  Family History  Problem Relation Age of Onset  . Prostate cancer Father   . Breast cancer Maternal Aunt   . Breast cancer Maternal Aunt   . Cerebral palsy Son     Social History Social History   Tobacco Use  . Smoking status: Never Smoker  . Smokeless tobacco: Never Used  Vaping Use  . Vaping Use: Never used  Substance Use Topics  . Alcohol use: No  . Drug use: No    Review of Systems Constitutional: No fever/chills Eyes: No visual changes. ENT: No sore throat. Cardiovascular: Denies chest pain. Respiratory: +shortness of breath, + orthopnea. Gastrointestinal: No abdominal pain.  No nausea, no vomiting.  No diarrhea.  No constipation. Genitourinary: Negative for dysuria. Musculoskeletal: Negative for neck pain.  Negative for back pain. Integumentary: Negative for rash. Neurological: Negative for headaches, focal weakness or numbness.   ____________________________________________   PHYSICAL EXAM:  VITAL SIGNS: ED Triage Vitals  Enc Vitals Group     BP 12/04/19 0400 (!) 126/92     Pulse Rate 12/04/19 0400 (!) 102     Resp 12/04/19 0400 (!) 26     Temp 12/04/19 0400 98.3 F (36.8 C)     Temp Source 12/04/19 0400 Oral     SpO2 12/04/19 0400 92 %     Weight 12/04/19 0401 68 kg (150 lb)     Height 12/04/19 0401 1.651 m (5\' 5" )     Head Circumference --      Peak Flow --      Pain Score 12/04/19 0401 0     Pain Loc --      Pain Edu? --      Excl. in GC? --     Constitutional: Alert and oriented.  Appears somewhat frail at baseline. Eyes: Conjunctivae are  normal.  Head: Atraumatic. Nose: No congestion/rhinnorhea. Mouth/Throat: Patient is wearing a mask. Neck: No stridor.  No meningeal signs.   Cardiovascular: Normal rate, regular rhythm. Good peripheral circulation. Grossly normal heart sounds. Respiratory: Mild tachypnea.  No accessory muscle usage or intercostal retractions.  Generally clear breath sounds throughout with no coarse sounds at the bases.  The patient is currently on 2 L of oxygen by nasal cannula with oxygen saturations around 95%. Gastrointestinal: Soft and nontender. No distention.  Musculoskeletal: Trace pitting edema in bilateral lower extremities including the feet and lower legs. No gross deformities of extremities. Neurologic:  Normal speech and language. No gross focal neurologic deficits are  appreciated.  Skin:  Skin is warm, dry and intact. Psychiatric: Mood and affect are normal. Speech and behavior are normal.  ____________________________________________   LABS (all labs ordered are listed, but only abnormal results are displayed)  Labs Reviewed  BRAIN NATRIURETIC PEPTIDE - Abnormal; Notable for the following components:      Result Value   B Natriuretic Peptide 974.8 (*)    All other components within normal limits  CBC WITH DIFFERENTIAL/PLATELET - Abnormal; Notable for the following components:   Hemoglobin 10.2 (*)    HCT 33.2 (*)    MCV 71.4 (*)    MCH 21.9 (*)    RDW 16.6 (*)    All other components within normal limits  COMPREHENSIVE METABOLIC PANEL - Abnormal; Notable for the following components:   CO2 21 (*)    Glucose, Bld 136 (*)    Calcium 8.2 (*)    Albumin 3.2 (*)    AST 45 (*)    All other components within normal limits  SARS CORONAVIRUS 2 BY RT PCR (HOSPITAL ORDER, PERFORMED IN [facility])  TROPONIN I (HIGH SENSITIVITY)   ____________________________________________  EKG  ED ECG REPORT I, Loleta Rose, the attending physician, personally viewed and interpreted  this ECG.  Date: 12/04/2019 EKG Time: 3:59 AM Rate: 100 Rhythm: Atrial fibrillation QRS Axis: Right axis deviation Intervals: normal ST/T Wave abnormalities: Non-specific ST segment / T-wave changes, but no clear evidence of acute ischemia. Narrative Interpretation: no definitive evidence of acute ischemia; does not meet STEMI criteria.   ____________________________________________  RADIOLOGY I, Loleta Rose, personally viewed and evaluated these images (plain radiographs) as part of my medical decision making, as well as reviewing the written report by the radiologist.  ED MD interpretation: CHF, no evidence of lobar pneumonia, no pneumothorax.  Official radiology report(s): DG Chest Portable 1 View  Result Date: 12/04/2019 CLINICAL DATA:  74 year old female with history of shortness of breath. EXAM: PORTABLE CHEST 1 VIEW COMPARISON:  Chest x-ray 03/17/2019. FINDINGS: There is cephalization of the pulmonary vasculature and slight indistinctness of the interstitial markings suggestive of mild pulmonary edema. Small to moderate right and small left pleural effusions. Bibasilar opacities which may reflect areas of atelectasis and/or consolidation. Mild cardiomegaly. Upper mediastinal contours are within normal limits. Aortic atherosclerosis. IMPRESSION: 1. The appearance the chest suggest congestive heart failure, as above. 2. In addition, there are bibasilar opacities which are favored to reflect areas of subsegmental atelectasis in the setting of bilateral pleural effusions, although underlying airspace consolidation is not excluded. 3. Aortic atherosclerosis. Electronically Signed   By: Trudie Reed M.D.   On: 12/04/2019 04:30    ____________________________________________   PROCEDURES   Procedure(s) performed (including Critical Care):  .1-3 Lead EKG Interpretation Performed by: Loleta Rose, MD Authorized by: Loleta Rose, MD     Interpretation: abnormal     ECG rate:   105   ECG rate assessment: tachycardic     Rhythm: atrial fibrillation     Ectopy: none     Conduction: normal       ____________________________________________   INITIAL IMPRESSION / MDM / ASSESSMENT AND PLAN / ED COURSE  As part of my medical decision making, I reviewed the following data within the electronic MEDICAL RECORD NUMBER Nursing notes reviewed and incorporated, Labs reviewed , EKG interpreted , Old chart reviewed, Radiograph reviewed , Discussed with admitting physician  and Notes from prior ED visits   Differential diagnosis includes, but is not limited to, CHF exacerbation, A.  fib with RVR, pneumonia, pneumothorax, PE, ACS.  The patient's signs and symptoms are most consistent with volume overload secondary to acute CHF exacerbation.  Her physical exam is not profound and its abnormalities but she is describing gradually worsening symptoms over the last for 5 days that have worsened to the point that she felt like she needed to come to the emergency department.  She was satting 90% on room air with tachypnea and seems to be a little bit better and in the mid to upper 90s on 2 L of oxygen by nasal cannula.  The patient is on the cardiac monitor to evaluate for evidence of arrhythmia and/or significant heart rate changes.  Lab work is notable for negative COVID-19, essentially normal high-sensitivity troponin, essentially normal comprehensive metabolic panel, elevated BNP but it is actually lower than it has been in the past, and a normal CBC.  Chest x-ray demonstrates CHF, EKG is nonischemic.  Very mild tachycardia but not enough to treat as A. fib with RVR.  I will contact the hospitalist for admission.  This is the patient's preference that she agrees with the plan.     Clinical Course as of Dec 03 733  Wynelle LinkSun Dec 04, 2019  [id number] Discussed case by phone with Dr. Arville CareMansy who will admit.   [CF]    Clinical Course User Index [CF] Loleta RoseForbach, Zenas Santa, MD      ____________________________________________  FINAL CLINICAL IMPRESSION(S) / ED DIAGNOSES  Final diagnoses:  Acute on chronic congestive heart failure, unspecified heart failure type (HCC)     MEDICATIONS GIVEN DURING THIS VISIT:  Medications  furosemide (LASIX) injection 20 mg (20 mg Intravenous Given 12/04/19 0525)     ED Discharge Orders    None      *Please note:  Lizbet B Rozier was evaluated in Emergency Department on 12/04/2019 for the symptoms described in the history of present illness. She was evaluated in the context of the global COVID-19 pandemic, which necessitated consideration that the patient might be at risk for infection with the SARS-CoV-2 virus that causes COVID-19. Institutional protocols and algorithms that pertain to the evaluation of patients at risk for COVID-19 are in a state of rapid change based on information released by regulatory bodies including the CDC and federal and state organizations. These policies and algorithms were followed during the patient's care in the ED.  Some ED evaluations and interventions may be delayed as a result of limited staffing during and after the pandemic.*  Note:  This document was prepared using Dragon voice recognition software and may include unintentional dictation errors.   Loleta RoseForbach, Teddy Rebstock, MD 12/04/19 [phone]

## 2019-12-04 NOTE — Plan of Care (Signed)

## 2019-12-04 NOTE — ED Notes (Signed)
Report finished att, pt to go up at 0730, pt briefed on admission room, and POC, pt watching tv

## 2019-12-04 NOTE — Plan of Care (Signed)
  Problem: Health Behavior/Discharge Planning: Goal: Ability to manage health-related needs will improve Outcome: Progressing   Problem: Clinical Measurements: Goal: Respiratory complications will improve Outcome: Progressing   Problem: Activity: Goal: Risk for activity intolerance will decrease Outcome: Progressing   Problem: Safety: Goal: Ability to remain free from injury will improve Outcome: Progressing   

## 2019-12-04 NOTE — ED Triage Notes (Signed)
Patient presents to Emergency Department via [facility] from home with complaints of SOB worse with exertion and lying flat, possible dx (per EMS) of CHF - pt reports taking lasix for recently increased LLE swelling    History of afib and takes eliquis for the same

## 2019-12-04 NOTE — H&P (Signed)
History and Physical    [name] URK:[id number] DOB: 1945-10-25 DOA: 12/04/2019  Referring MD/NP/PA:   PCP: Lauren Fuchs, MD   Patient coming from:  The patient is coming from home.  At baseline, pt is independent for most of ADL.        Chief Complaint: SOB and leg edema  HPI: [name] is a 74 y.o. female with medical history significant of hypertension, stroke, hypothyroidism, dCHF, atrial fibrillation on Eliquis, anemia, IBS, who presents with shortness breath and leg edema.  Patient states that she has been having shortness in the past several days, which has been progressively worsening.  Her shortness breath is worse when laying flat.  She also has worsening bilateral lower leg edema.  She has dry cough, no chest pain, fever or chills.  Patient states that she has history irritable bowel syndrome, occasionally has abdominal pain, but today no nausea vomiting, diarrhea or abdominal pain.  No symptoms of UTI or unilateral weakness.  ED Course: pt was found to have WBC 9.5, BNP 974, troponin 7, negative Covid PCR, electrolytes renal function okay, temperature normal, blood pressure 110/85, tachycardia, tachypnea, oxygen saturation 92% on room air, which improved to 98% on 2 L nasal cannula oxygen.  Chest x-ray showed pulmonary edema.  Patient is admitted to progressive bed as inpatient.  Review of Systems:   General: no fevers, chills, has poor appetite, has fatigue HEENT: no blurry vision, hearing changes or sore throat Respiratory: Has dyspnea, coughing, no wheezing CV: no chest pain, no palpitations GI: no nausea, vomiting, abdominal pain, diarrhea, constipation GU: no dysuria, burning on urination, increased urinary frequency, hematuria  Ext: Has leg edema Neuro: no unilateral weakness, numbness, or tingling, no vision change or hearing loss Skin: no rash, no skin tear. MSK: No muscle spasm, no deformity, no limitation of range of movement in spin Heme: No easy  bruising.  Travel history: No recent long distant travel.  Allergy:  Allergies  Allergen Reactions  . Prednisone Hives  . Predicort [Prednisolone Acetate]     HIVES,    Past Medical History:  Diagnosis Date  . Acute respiratory failure (HCC)    Secondary to aspiration pneumonia   . Altered mental status    Secondary to viral herpes simplex virus encephalitis  . Anemia   . Atrial fibrillation (HCC)   . Bilateral pneumonia   . CHF (congestive heart failure) (HCC)   . Dysphasia   . Encephalitis   . Hyperglycemia   . Hypertension   . Hyponatremia   . Seizures (HCC)   . Stroke [facility])     Past Surgical History:  Procedure Laterality Date  . CHOLECYSTECTOMY    . COLONOSCOPY WITH PROPOFOL N/A 12/31/2016   Procedure: COLONOSCOPY WITH PROPOFOL;  Surgeon: Scot Jun, MD;  Location: [facility] ENDOSCOPY;  Service: Endoscopy;  Laterality: N/A;  . ESOPHAGOGASTRODUODENOSCOPY (EGD) WITH PROPOFOL N/A 12/31/2016   Procedure: ESOPHAGOGASTRODUODENOSCOPY (EGD) WITH PROPOFOL;  Surgeon: Scot Jun, MD;  Location: [facility] ENDOSCOPY;  Service: Endoscopy;  Laterality: N/A;  . TONSILLECTOMY    . TUBAL LIGATION      Social History:  reports that she has never smoked. She has never used smokeless tobacco. She reports that she does not drink alcohol and does not use drugs.  Family History:  Family History  Problem Relation Age of Onset  . Prostate cancer Father   . Breast cancer Maternal Aunt   . Breast cancer Maternal Aunt   . Cerebral palsy  Son      Prior to Admission medications   Medication Sig Start Date End Date Taking? Authorizing Provider  apixaban (ELIQUIS) 5 MG TABS tablet Take 1 tablet (5 mg total) by mouth 2 (two) times daily. 03/20/19  Yes Altamese Dilling, MD  Calcium Carb-Cholecalciferol (CALCIUM-VITAMIN D) 500-200 MG-UNIT tablet Take 1 tablet by mouth 2 (two) times daily.   Yes [provider]  diltiazem (CARDIZEM CD) 240 MG 24 hr capsule Take 1 capsule (240  mg total) by mouth daily. 03/21/19  Yes Altamese Dilling, MD  folic acid (FOLVITE) 1 MG tablet Take 1 mg by mouth daily. Per tube once daily     Yes [provider]  furosemide (LASIX) 20 MG tablet Take 20 mg by mouth daily.   Yes [provider]  guaiFENesin (MUCINEX) 600 MG 12 hr tablet Take 600 mg by mouth 2 (two) times daily.   Yes [provider]  levothyroxine (SYNTHROID) 75 MCG tablet Take 75 mcg by mouth daily before breakfast.    Yes [provider]  metoprolol succinate (TOPROL-XL) 25 MG 24 hr tablet Take 25 mg by mouth daily. 03/05/19  Yes [provider]  omeprazole (PRILOSEC) 20 MG capsule Take 20 mg by mouth daily.   Yes [provider]  potassium chloride (KLOR-CON) 10 MEQ tablet Take 10 mEq by mouth daily.   Yes [provider]  aspirin 81 MG tablet Take 81 mg by mouth daily.   Patient not taking: Reported on 12/04/2019    [provider]  Loperamide HCl (IMODIUM PO) Take by mouth as needed. Patient not taking: Reported on 12/04/2019    [provider]  meclizine (ANTIVERT) 12.5 MG tablet Take 1 tablet (12.5 mg total) by mouth 2 (two) times daily as needed for dizziness. Patient not taking: Reported on 12/04/2019 01/13/18   Willy Eddy, MD    Physical Exam: Vitals:   12/04/19 0700 12/04/19 0716 12/04/19 0800 12/04/19 1127  BP:   110/85 101/77  Pulse: 98 99 (!) 101 (!) 114  Resp: 15 19 18 18   Temp:   98.6 F (37 C) 98.5 F (36.9 C)  TempSrc:      SpO2: 95% 95% 98% 98%  Weight:      Height:       General: Not in acute distress HEENT:       Eyes: PERRL, EOMI, no scleral icterus.       ENT: No discharge from the ears and nose, no pharynx injection, no tonsillar enlargement.        Neck: positive JVD, no bruit, no mass felt. Heme: No neck lymph node enlargement. Cardiac: S1/S2, RRR, 3/6 systolic murmurs, No gallops or rubs. Respiratory: No rales, wheezing, rhonchi or rubs. GI: Soft,  nondistended, nontender, no rebound pain, no organomegaly, BS present. GU: No hematuria Ext: 1+ pitting leg edema bilaterally. 1+DP/PT pulse bilaterally. Musculoskeletal: No joint deformities, No joint redness or warmth, no limitation of ROM in spin. Skin: No rashes.  Neuro: Alert, oriented X3, cranial nerves II-XII grossly intact, moves all extremities normally. Psych: Patient is not psychotic, no suicidal or hemocidal ideation.  Labs on Admission: I have personally reviewed following labs and imaging studies  CBC: Recent Labs  Lab 12/04/19 0401  WBC 9.5  NEUTROABS 7.2  HGB 10.2*  HCT 33.2*  MCV 71.4*  PLT 266   Basic Metabolic Panel: Recent Labs  Lab 12/04/19 0401  NA 137  K 3.7  CL 108  CO2 21*  GLUCOSE  136*  BUN 14  CREATININE 0.86  CALCIUM 8.2*   GFR: Estimated Creatinine Clearance: 50.9 mL/min (by C-G formula based on SCr of 0.86 mg/dL). Liver Function Tests: Recent Labs  Lab 12/04/19 0401  AST 45*  ALT 33  ALKPHOS 93  BILITOT 0.9  PROT 6.6  ALBUMIN 3.2*   No results for input(s): LIPASE, AMYLASE in the last 168 hours. No results for input(s): AMMONIA in the last 168 hours. Coagulation Profile: No results for input(s): INR, PROTIME in the last 168 hours. Cardiac Enzymes: No results for input(s): CKTOTAL, CKMB, CKMBINDEX, TROPONINI in the last 168 hours. BNP (last 3 results) No results for input(s): PROBNP in the last 8760 hours. HbA1C: No results for input(s): HGBA1C in the last 72 hours. CBG: No results for input(s): GLUCAP in the last 168 hours. Lipid Profile: No results for input(s): CHOL, HDL, LDLCALC, TRIG, CHOLHDL, LDLDIRECT in the last 72 hours. Thyroid Function Tests: No results for input(s): TSH, T4TOTAL, FREET4, T3FREE, THYROIDAB in the last 72 hours. Anemia Panel: No results for input(s): VITAMINB12, FOLATE, FERRITIN, TIBC, IRON, RETICCTPCT in the last 72 hours. Urine analysis:    Component Value Date/Time   COLORURINE STRAW (A)  01/13/2018 1530   APPEARANCEUR CLEAR (A) 01/13/2018 1530   APPEARANCEUR Clear 08/29/2011 0037   LABSPEC 1.002 (L) 01/13/2018 1530   LABSPEC 1.009 08/29/2011 0037   PHURINE 6.0 01/13/2018 1530   GLUCOSEU NEGATIVE 01/13/2018 1530   GLUCOSEU Negative 08/29/2011 0037   HGBUR SMALL (A) 01/13/2018 1530   BILIRUBINUR NEGATIVE 01/13/2018 1530   BILIRUBINUR Negative 08/29/2011 0037   KETONESUR NEGATIVE 01/13/2018 1530   PROTEINUR NEGATIVE 01/13/2018 1530   UROBILINOGEN 0.2 01/17/2011 0037   NITRITE NEGATIVE 01/13/2018 1530   LEUKOCYTESUR NEGATIVE 01/13/2018 1530   LEUKOCYTESUR 1+ 08/29/2011 0037   Sepsis Labs: @LABRCNTIP (procalcitonin:4,lacticidven:4) ) Recent Results (from the past 240 hour(s))  SARS Coronavirus 2 by RT PCR (hospital order, performed in [facility] hospital lab) Nasopharyngeal Nasopharyngeal Swab     Status: None   Collection Time: 12/04/19  5:10 AM   Specimen: Nasopharyngeal Swab  Result Value Ref Range Status   SARS Coronavirus 2 NEGATIVE NEGATIVE Final    Comment: (NOTE) SARS-CoV-2 target nucleic acids are NOT DETECTED.  The SARS-CoV-2 RNA is generally detectable in upper and lower respiratory specimens during the acute phase of infection. The lowest concentration of SARS-CoV-2 viral copies this assay can detect is 250 copies / mL. A negative result does not preclude SARS-CoV-2 infection and should not be used as the sole basis for treatment or other patient management decisions.  A negative result may occur with improper specimen collection / handling, submission of specimen other than nasopharyngeal swab, presence of viral mutation(s) within the areas targeted by this assay, and inadequate number of viral copies (<250 copies / mL). A negative result must be combined with clinical observations, patient history, and epidemiological information.  Fact Sheet for Patients:   02/04/20  Fact Sheet for Healthcare  Providers: BoilerBrush.com.cy  This test is not yet approved or  cleared by the https://pope.com/ FDA and has been authorized for detection and/or diagnosis of SARS-CoV-2 by FDA under an Emergency Use Authorization (EUA).  This EUA will remain in effect (meaning this test can be used) for the duration of the COVID-19 declaration under Section 564(b)(1) of the Act, 21 U.S.C. section 360bbb-3(b)(1), unless the authorization is terminated or revoked sooner.  Performed at [facility], [address], [place], [place]  Radiological Exams on Admission: DG Chest Portable 1 View  Result Date: 12/04/2019 CLINICAL DATA:  74 year old female with history of shortness of breath. EXAM: PORTABLE CHEST 1 VIEW COMPARISON:  Chest x-ray 03/17/2019. FINDINGS: There is cephalization of the pulmonary vasculature and slight indistinctness of the interstitial markings suggestive of mild pulmonary edema. Small to moderate right and small left pleural effusions. Bibasilar opacities which may reflect areas of atelectasis and/or consolidation. Mild cardiomegaly. Upper mediastinal contours are within normal limits. Aortic atherosclerosis. IMPRESSION: 1. The appearance the chest suggest congestive heart failure, as above. 2. In addition, there are bibasilar opacities which are favored to reflect areas of subsegmental atelectasis in the setting of bilateral pleural effusions, although underlying airspace consolidation is not excluded. 3. Aortic atherosclerosis. Electronically Signed   By: Trudie Reedaniel  Entrikin M.D.   On: 12/04/2019 04:30     EKG: Independently reviewed.  Atrial fibrillation, QTc 480, nonspecific T wave change  Assessment/Plan Principal Problem:   Acute on chronic diastolic CHF (congestive heart failure) (HCC) Active Problems:   Atrial fibrillation (HCC)   Hypertension   Stroke (HCC)   Hypothyroidism   Acute respiratory failure with hypoxia (HCC)  Acute  respiratory failure with hypoxia and acute on chronic diastolic CHF (congestive heart failure): Patient has 1+ leg edema, positive JVD, pulmonary edema chest x-ray, elevated BNP 974, clinically consistent with CHF exacerbation.  2D echo on 03/18/2019 showed EF 60-65%.  Patient had 2D echo on 07/14/2018 which showed EF of 45-50%.  She is likely to have diastolic congestive heart failure currently.   -Will admit to progressive unit as inpatient -Lasix 20 mg bid by IV (patient will receive 20 mg Lasix in ED, will give another 20 mg Lasix today) -2d echo -Daily weights -strict I/O's -Low salt diet -Fluid restriction -Obtain REDs Vest reading  Atrial fibrillation (HCC) -Continue metoprolol, Cardizem -On Eliquis  Hypertension -Hydralazine as needed -Continue metoprolol, Cardizem  Stroke [facility](HCC) -Patient is Eliquis for A. Fib  Hypothyroidism -Synthroid    DVT ppx: SQ Lovenox Code Status: Full code Family Communication:  Yes, patient's  son  at bed side Disposition Plan:  Anticipate discharge back to previous environment Consults called:  none Admission status: progressive unit  as inpt    Status is: Inpatient  Remains inpatient appropriate because:Inpatient level of care appropriate due to severity of illness.  Patient has multiple comorbidities, including CHF and atrial fibrillation, who presents with CHF exacerbation.  Patient also has acute respiratory failure with hypoxia due to CHF exacerbation.  Her presentation is highly complicated.  Patient is at high risk of deteriorating.  Patient will need to be treated in hospital for at least 2 days.   Dispo: The patient is from: Home              Anticipated d/c is to: Home              Anticipated d/c date is: 2 days              Patient currently is not medically stable to d/c.          Date of Service 12/04/2019    Lorretta HarpXilin Trenyce Loera [facility]   If 7PM-7AM, please contact night-coverage [email or web address] 12/04/2019,  11:40 AM

## 2019-12-04 NOTE — ED Notes (Signed)
Call bell answered; pt reports "I can't breathe", pt appears relaxed and without distress, pt repositioned and coached for effective breathing to maximize Moody  Pt able to calm and respond to interaction, reports "better now"

## 2019-12-04 NOTE — Progress Notes (Signed)
   12/04/19 1926  Assess: MEWS Score  Temp 98 F (36.7 C)  BP 126/87  Pulse Rate (!) 114  Resp 20  SpO2 97 %  O2 Device Room Air  Assess: MEWS Score  MEWS Temp 0  MEWS Systolic 0  MEWS Pulse 2  MEWS RR 0  MEWS LOC 0  MEWS Score 2  MEWS Score Color Yellow  Assess: if the MEWS score is Yellow or Red  Were vital signs taken at a resting state? Yes  Focused Assessment Documented focused assessment  Early Detection of Sepsis Score *See Row Information* Low  MEWS guidelines implemented *See Row Information* No, previously yellow, continue vital signs every 4 hours  Treat  MEWS Interventions Administered scheduled meds/treatments  Take Vital Signs  Increase Vital Sign Frequency  Yellow: Q 2hr X 2 then Q 4hr X 2, if remains yellow, continue Q 4hrs  Escalate  MEWS: Escalate Yellow: discuss with charge nurse/RN and consider discussing with provider and RRT  Notify: Charge Nurse/RN  Name of Charge Nurse/RN Notified Heywood Footman, RN  Date Charge Nurse/RN Notified 12/04/19  Time Charge Nurse/RN Notified 1937

## 2019-12-05 LAB — BASIC METABOLIC PANEL
Anion gap: 7 (ref 5–15)
BUN: 12 mg/dL (ref 8–23)
CO2: 25 mmol/L (ref 22–32)
Calcium: 8.4 mg/dL — ABNORMAL LOW (ref 8.9–10.3)
Chloride: 107 mmol/L (ref 98–111)
Creatinine, Ser: 0.81 mg/dL (ref 0.44–1.00)
GFR calc Af Amer: >60 mL/min (ref >60)
GFR calc non Af Amer: >60 mL/min (ref >60)
Glucose, Bld: 94 mg/dL (ref 70–99)
Potassium: 3.7 mmol/L (ref 3.5–5.1)
Sodium: 139 mmol/L (ref 135–145)

## 2019-12-05 LAB — CBC
HCT: 33.1 % — ABNORMAL LOW (ref 36.0–46.0)
Hemoglobin: 10.1 g/dL — ABNORMAL LOW (ref 12.0–15.0)
MCH: 21.6 pg — ABNORMAL LOW (ref 26.0–34.0)
MCHC: 30.5 g/dL (ref 30.0–36.0)
MCV: 70.9 fL — ABNORMAL LOW (ref 80.0–100.0)
Platelets: 254 10*3/uL (ref 150–400)
RBC: 4.67 MIL/uL (ref 3.87–5.11)
RDW: 16.6 % — ABNORMAL HIGH (ref 11.5–15.5)
WBC: 8.5 10*3/uL (ref 4.0–10.5)
nRBC: 0 % (ref 0.0–0.2)

## 2019-12-05 LAB — MAGNESIUM: Magnesium: 2 mg/dL (ref 1.7–2.4)

## 2019-12-05 MED ORDER — FUROSEMIDE 40 MG PO TABS
40.0000 mg | ORAL_TABLET | Freq: Every day | ORAL | Status: DC
  Administered 2019-12-05 – 2019-12-07 (×3): 40 mg via ORAL
  Filled 2019-12-05 (×4): qty 1

## 2019-12-05 NOTE — Evaluation (Signed)
Physical Therapy Evaluation Patient Details Name: [name] MRN: [id number] DOB: 04-11-1946 Today's Date: 12/05/2019   History of Present Illness  Pt is a 74 y.o. female with medical history significant of hypertension, stroke, hypothyroidism, dCHF, atrial fibrillation on Eliquis, anemia, IBS, who presents with shortness breath and leg edema. Chest xray showed pulmonary edema, admitted for acute on chronic diastolic CHF.    Clinical Impression  Pt alert, oriented, very pleasant throughout session. Pt reported at baseline no falls in the last 21months, lives alone in an apartment, son assists with grocery shopping. Pt independent with ADLs including medication management, independent for household ambulation, pt uses 3 prong cane for community mobility.   The patient demonstrated bed mobility modI, and sit <> stand transfer with RW and supervision, cueing for hand placement needed to increase safety. The patient ambulated ~163ft with varying AD. Pt trialed on RW, hallway rail (similar to cane single UE support), and no AD. Pt exhibited increased safety, gait velocity, and activity tolerance with RW, pt also endorsed greater comfort with RW.  Pt did experience one small LOB without AD, able to correct with CGA.  Overall the patient demonstrated deficits (see "PT Problem List") that impede the patient's functional abilities, safety, and mobility and would benefit from skilled PT intervention. Recommendation is HHPT with intermittent supervision.   With RN consent pt weaned to room air and performed all mobility without O2, spO2 95% or greater. In chair on room air at end of session, RN aware.     Follow Up Recommendations Home health PT;Supervision - Intermittent    Equipment Recommendations  Rolling walker with 5" wheels    Recommendations for Other Services       Precautions / Restrictions Precautions Precautions: None Restrictions Weight Bearing Restrictions: No      Mobility  Bed  Mobility Overal bed mobility: Modified Independent                Transfers Overall transfer level: Needs assistance Equipment used: Rolling walker (2 wheeled) Transfers: Sit to/from Stand Sit to Stand: Supervision         General transfer comment: cueing for hand placement to increase safety.  Ambulation/Gait Ambulation/Gait assistance: Supervision Gait Distance (Feet): 170 Feet Assistive device: Rolling walker (2 wheeled);None (hallway rail)   Gait velocity: decreased Gait velocity interpretation: 1.31 - 2.62 ft/sec, indicative of limited community ambulator General Gait Details: Pt trialed on RW, hallway rail (similar to cane single UE support), and no AD. Pt exhibited increased safety, gait velocity, and activity tolerance with RW, pt also endorsed greater comfort with RW.  Stairs            Wheelchair Mobility    Modified Rankin (Stroke Patients Only)       Balance Overall balance assessment: Needs assistance Sitting-balance support: Feet supported Sitting balance-Leahy Scale: Good       Standing balance-Leahy Scale: Good Standing balance comment: pt with one small LOB without AD, able to correct with CGA                             Pertinent Vitals/Pain Pain Assessment: No/denies pain    Home Living Family/patient expects to be discharged to:: Private residence Living Arrangements: Alone Available Help at Discharge: Family;Available PRN/intermittently Type of Home: Apartment Home Access: Level entry     Home Layout: One level Home Equipment: Cane - single point      Prior Function Level of Independence:  Independent with assistive device(s)         Comments: uses SPC for community ambulation. Family assists with groceries once a month and clipping pt toenails.     Hand Dominance   Dominant Hand: Right    Extremity/Trunk Assessment   Upper Extremity Assessment Upper Extremity Assessment: Overall WFL for tasks  assessed    Lower Extremity Assessment Lower Extremity Assessment: Generalized weakness    Cervical / Trunk Assessment Cervical / Trunk Assessment: Normal  Communication   Communication: No difficulties  Cognition Arousal/Alertness: Awake/alert Behavior During Therapy: WFL for tasks assessed/performed Overall Cognitive Status: Within Functional Limits for tasks assessed                                        General Comments      Exercises Other Exercises Other Exercises: Pt instructed in exercises to address strength concerns, continued mobility, and benefit of OOB to chair 2-3 times a day.   Assessment/Plan    PT Assessment Patient needs continued PT services  PT Problem List Decreased mobility;Decreased strength;Decreased activity tolerance;Decreased balance       PT Treatment Interventions DME instruction;Therapeutic exercise;Gait training;Balance training;Neuromuscular re-education;Functional mobility training;Therapeutic activities;Patient/family education    PT Goals (Current goals can be found in the Care Plan section)  Acute Rehab PT Goals Patient Stated Goal: to get home health physical therapy again PT Goal Formulation: With patient Time For Goal Achievement: 12/19/19 Potential to Achieve Goals: Good    Frequency Min 2X/week   Barriers to discharge        Co-evaluation               AM-PAC PT "6 Clicks" Mobility  Outcome Measure Help needed turning from your back to your side while in a flat bed without using bedrails?: None Help needed moving from lying on your back to sitting on the side of a flat bed without using bedrails?: None Help needed moving to and from a bed to a chair (including a wheelchair)?: A Little Help needed standing up from a chair using your arms (e.g., wheelchair or bedside chair)?: A Little Help needed to walk in hospital room?: A Little Help needed climbing 3-5 steps with a railing? : A Little 6 Click  Score: 20    End of Session Equipment Utilized During Treatment: Gait belt Activity Tolerance: Patient tolerated treatment well Patient left: in chair;with chair alarm set;with call bell/phone within reach Nurse Communication: Mobility status PT Visit Diagnosis: Muscle weakness (generalized) (M62.81);Other abnormalities of gait and mobility (R26.89);Difficulty in walking, not elsewhere classified (R26.2)    Time: 3235-5732 PT Time Calculation (min) (ACUTE ONLY): 29 min   Charges:   PT Evaluation $PT Eval Low Complexity: 1 Low PT Treatments $Therapeutic Exercise: 8-22 mins      Olga Coaster PT, DPT 9:49 AM,12/05/19

## 2019-12-05 NOTE — Progress Notes (Signed)
   12/04/19 1900  ReDS Vest / Clip  Station Marker A  Ruler Value 28.5  ReDS Value Range < 36  ReDS Actual Value 33

## 2019-12-05 NOTE — Evaluation (Signed)
Occupational Therapy Evaluation Patient Details Name: [name] MRN: [id number] DOB: [birth year] Today's Date: 12/05/2019    History of Present Illness Pt is a 74 y.o. female with medical history significant of hypertension, stroke, hypothyroidism, dCHF, atrial fibrillation on Eliquis, anemia, IBS, who presents with shortness breath and leg edema. Chest xray showed pulmonary edema, admitted for acute on chronic diastolic CHF.   Clinical Impression   Pt presents this afternoon pleasant and agreeable to evaluation, conversational and at near baseline functional participation. She lives alone in an apartment complex next to her church, where many church members live and stop to check on her daily. Prior to hospitalization, she was independent in ADL and only used an [facility] for community ambulation. She no longer drives, but has family to bring her groceries and she uses a Transport planner to go to the [employer]. Pt states that she is still able to care for herself independently and her biggest concern was being short of breath while talking. Throughout evaluation, pt was on room air and SpO2 remained >97% in sitting, standing and walking, all while pt consistently conversing with OT. Pt maintained good balance ambulating with a RW, Supervision only. Mod I for donning/doffing shoes while seated. Educated pt re: falls prevention strategies, home/routine modifications to conserve energy, pursed lip breathing and self-monitoring of vitals. Pt endorsed incorporating pursed lip breathing already and having a chart for her vitals that she uses daily. Pt appears to be at functional baseline. No OT needs anticipated at this time.     Follow Up Recommendations  No OT follow up;Supervision - Intermittent    Equipment Recommendations  Tub/shower bench    Recommendations for Other Services       Precautions / Restrictions Precautions Precautions: None Restrictions Weight Bearing  Restrictions: No      Mobility Bed Mobility               General bed mobility comments: pt up in chair on arrival/departure  Transfers Overall transfer level: Modified independent Equipment used: Rolling walker (2 wheeled) Transfers: Sit to/from Stand Sit to Stand: Modified independent (Device/Increase time)         General transfer comment: steady eccentric and concentric control, good posture and use of arm rests    Balance Overall balance assessment: Needs assistance Sitting-balance support: Feet supported;No upper extremity supported Sitting balance-Leahy Scale: Good Sitting balance - Comments: steady balance when reaching outside BOS   Standing balance support: No upper extremity supported Standing balance-Leahy Scale: Good Standing balance comment: maintains static balance with and without AD, improved dynamic balance with RW                           ADL either performed or assessed with clinical judgement   ADL Overall ADL's : At baseline;Modified independent                                       General ADL Comments: Independently self-feeding in chair. Mod I for reaching down to don/doff slip on shoes. No difficulties with sit to stand transfers with or without RW. No ROM difficulties noted to impact dressing/bathing.     Vision Baseline Vision/History: Wears glasses Wears Glasses: At all times       Perception     Praxis      Pertinent Vitals/Pain Pain Assessment: No/denies pain  Hand Dominance Right   Extremity/Trunk Assessment Upper Extremity Assessment Upper Extremity Assessment: Generalized weakness;Overall WFL for tasks assessed (3+/5 bilaterally, weak hand grasp)   Lower Extremity Assessment Lower Extremity Assessment: Generalized weakness   Cervical / Trunk Assessment Cervical / Trunk Assessment: Normal   Communication Communication Communication: No difficulties   Cognition Arousal/Alertness:  Awake/alert Behavior During Therapy: WFL for tasks assessed/performed Overall Cognitive Status: Within Functional Limits for tasks assessed                                 General Comments: A&O x4, very conversational and pleasant   General Comments       Exercises Other Exercises Other Exercises: Pt educated re: falls prevention strategies, home/routine modifications to conserve energy, pursed lip breathing and self-monitoring of vitals in order to maximize pt independence and safety Other Exercises: Recommended adaptive equipment to assist with energy conservation during ADL   Shoulder Instructions      Home Living Family/patient expects to be discharged to:: Private residence (apartment owned by her church) Living Arrangements: Alone Available Help at Discharge: Family;Available PRN/intermittently Type of Home: Apartment Home Access: Level entry     Home Layout: One level     Bathroom Shower/Tub: Chief Strategy Officer: Standard     Home Equipment: Cane - single point          Prior Functioning/Environment Level of Independence: Independent with assistive device(s)        Comments: Uses SPC for community ambulation, no falls reported. Does not drive. Family assists with groceries once a month and clipping pt toenails. Does not require assist for other ADL/IADL. Members of pt's church live in her apartment and come by to check on her daily.        OT Problem List: Decreased strength;Cardiopulmonary status limiting activity;Decreased knowledge of use of DME or AE      OT Treatment/Interventions:      OT Goals(Current goals can be found in the care plan section) Acute Rehab OT Goals Patient Stated Goal: To not be out of breath while talking OT Goal Formulation: All assessment and education complete, DC therapy  OT Frequency:     Barriers to D/C:            Co-evaluation              AM-PAC OT "6 Clicks" Daily Activity      Outcome Measure Help from another person eating meals?: None Help from another person taking care of personal grooming?: None Help from another person toileting, which includes using toliet, bedpan, or urinal?: None Help from another person bathing (including washing, rinsing, drying)?: None Help from another person to put on and taking off regular upper body clothing?: None Help from another person to put on and taking off regular lower body clothing?: None 6 Click Score: 24   End of Session Equipment Utilized During Treatment: Gait belt;Rolling walker Nurse Communication: Mobility status  Activity Tolerance: Patient tolerated treatment well Patient left: in chair;with call bell/phone within reach;with chair alarm set  OT Visit Diagnosis: Other abnormalities of gait and mobility (R26.89);Muscle weakness (generalized) (M62.81)                Time: 9470-9628 OT Time Calculation (min): 30 min Charges:  OT General Charges $OT Visit: 1 Visit OT Evaluation $OT Eval Low Complexity: 1 Low OT Treatments $Self Care/Home Management : 8-22 mins  [name]  [name], OTS 12/05/19, 4:07 PM

## 2019-12-05 NOTE — Progress Notes (Signed)
Home Oxygen Qualification Note   SaO2 on room air at rest = 97% SaO2 on room air while ambulating = 96%   Olga Coaster PT, DPT 9:50 AM,12/05/19

## 2019-12-05 NOTE — Progress Notes (Signed)
Pt requested to watch EMMI video after breakfast. Will notify incoming shift. Will continue to monitor.

## 2019-12-05 NOTE — Plan of Care (Signed)
  Problem: Education: Goal: Knowledge of General Education information will improve Description Including pain rating scale, medication(s)/side effects and non-pharmacologic comfort measures Outcome: Progressing   Problem: Clinical Measurements: Goal: Ability to maintain clinical measurements within normal limits will improve Outcome: Progressing Goal: Will remain free from infection Outcome: Progressing Goal: Diagnostic test results will improve Outcome: Progressing Goal: Respiratory complications will improve Outcome: Progressing Goal: Cardiovascular complication will be avoided Outcome: Progressing   Problem: Pain Managment: Goal: General experience of comfort will improve Outcome: Progressing   

## 2019-12-05 NOTE — Progress Notes (Addendum)
PROGRESS NOTE    [name]  KDT:[id number] DOB: April 14, 1946 DOA: 12/04/2019 PCP: Elba Barman, MD   Chief complaint..  Shortness of breath.  Brief Narrative:  [name] is a 74 y.o. female with medical history significant of hypertension, stroke, hypothyroidism, dCHF, atrial fibrillation on Eliquis, anemia, IBS, who presents with shortness breath and leg edema. Her chest x-ray showed pulmonary edema.  She was placed on IV Lasix for exacerbation of diastolic congestive heart failure. Reviewed most recent echocardiogram performed October 2020, showed normal ejection fraction, moderate pulmonary hypertension, mitral valve prolapse with severe regurgitation, calcified tricuspid valve.    Assessment & Plan:   Principal Problem:   Acute on chronic diastolic CHF (congestive heart failure) (HCC) Active Problems:   Atrial fibrillation (HCC)   Hypertension   Stroke (HCC)   Hypothyroidism   Acute respiratory failure with hypoxia (HCC)  #1. acute on chronic diastolic congestive heart failure.  Moderate pulmonary hypertension, moderate to severe mitral regurgitation. Patient received IV Lasix, volume status is better today.  I will change Lasix to oral with increased dose to 40 mg daily from 20 mg to baseline.  Patient has requested pulmonology consult, will obtain tomorrow.  2.  Atrial fibrillation. Continue anticoagulation.  Rate under control.  3.  Essential hypertension. Continue home medicines.  4.  Acute hypoxemic respiratory failure. Condition stable.  We will try to wean off oxygen.   DVT prophylaxis: Eliquis Code Status: Full Family Communication:  None Disposition Plan:  . Patient came from: Home           . Anticipated d/c place: We will discharge home tomorrow. . Barriers to d/c OR conditions which need to be met to effect a safe d/c:   Consultants:   None  Procedures: None Antimicrobials: None  Subjective: Still has some short of breath with  exertion.  No cough.  No fever or chills.  No nausea vomiting.  No abdominal pain.  No diarrhea constipation.   Objective: Vitals:   12/04/19 1926 12/04/19 2300 12/05/19 0000 12/05/19 0418  BP: 126/87  120/75 113/84  Pulse: (!) 114   91  Resp: '20 19  20  ' Temp: 98 F (36.7 C)  97.9 F (36.6 C) 98.1 F (36.7 C)  TempSrc: Oral  Oral Oral  SpO2: 97%  96% 96%  Weight:    66.3 kg  Height:        Intake/Output Summary (Last 24 hours) at 12/05/2019 0813 Last data filed at 12/05/2019 0719 Gross per 24 hour  Intake 600 ml  Output 3000 ml  Net -2400 ml   Filed Weights   12/04/19 0401 12/05/19 0418  Weight: 68 kg 66.3 kg    Examination:  General exam: Appears calm and comfortable  Respiratory system: a few crackles in the base at right. Respiratory effort normal. Cardiovascular system: Irregular, normal rate. 2/6 SM at the apex and the right upper sternal border.  1+ pedal edema. Gastrointestinal system: Abdomen is nondistended, soft and nontender. No organomegaly or masses felt. Normal bowel sounds heard. Central nervous system: Alert and oriented. No focal neurological deficits. Extremities: Symmetric  Skin: No rashes, lesions or ulcers Psychiatry: Judgement and insight appear normal. Mood & affect appropriate.     Data Reviewed: I have personally reviewed following labs and imaging studies  CBC: Recent Labs  Lab 12/04/19 0401 12/05/19 0509  WBC 9.5 8.5  NEUTROABS 7.2  --   HGB 10.2* 10.1*  HCT 33.2* 33.1*  MCV 71.4* 70.9*  PLT 266 469   Basic Metabolic Panel: Recent Labs  Lab 12/04/19 0401 12/05/19 0509  NA 137 139  K 3.7 3.7  CL 108 107  CO2 21* 25  GLUCOSE 136* 94  BUN 14 12  CREATININE 0.86 0.81  CALCIUM 8.2* 8.4*  MG  --  2.0   GFR: Estimated Creatinine Clearance: 54 mL/min (by C-G formula based on SCr of 0.81 mg/dL). Liver Function Tests: Recent Labs  Lab 12/04/19 0401  AST 45*  ALT 33  ALKPHOS 93  BILITOT 0.9  PROT 6.6  ALBUMIN 3.2*   No  results for input(s): LIPASE, AMYLASE in the last 168 hours. No results for input(s): AMMONIA in the last 168 hours. Coagulation Profile: No results for input(s): INR, PROTIME in the last 168 hours. Cardiac Enzymes: No results for input(s): CKTOTAL, CKMB, CKMBINDEX, TROPONINI in the last 168 hours. BNP (last 3 results) No results for input(s): PROBNP in the last 8760 hours. HbA1C: No results for input(s): HGBA1C in the last 72 hours. CBG: No results for input(s): GLUCAP in the last 168 hours. Lipid Profile: No results for input(s): CHOL, HDL, LDLCALC, TRIG, CHOLHDL, LDLDIRECT in the last 72 hours. Thyroid Function Tests: No results for input(s): TSH, T4TOTAL, FREET4, T3FREE, THYROIDAB in the last 72 hours. Anemia Panel: No results for input(s): VITAMINB12, FOLATE, FERRITIN, TIBC, IRON, RETICCTPCT in the last 72 hours. Sepsis Labs: No results for input(s): PROCALCITON, LATICACIDVEN in the last 168 hours.  Recent Results (from the past 240 hour(s))  SARS Coronavirus 2 by RT PCR (hospital order, performed in [facility] hospital lab) Nasopharyngeal Nasopharyngeal Swab     Status: None   Collection Time: 12/04/19  5:10 AM   Specimen: Nasopharyngeal Swab  Result Value Ref Range Status   SARS Coronavirus 2 NEGATIVE NEGATIVE Final    Comment: (NOTE) SARS-CoV-2 target nucleic acids are NOT DETECTED.  The SARS-CoV-2 RNA is generally detectable in upper and lower respiratory specimens during the acute phase of infection. The lowest concentration of SARS-CoV-2 viral copies this assay can detect is 250 copies / mL. A negative result does not preclude SARS-CoV-2 infection and should not be used as the sole basis for treatment or other patient management decisions.  A negative result may occur with improper specimen collection / handling, submission of specimen other than nasopharyngeal swab, presence of viral mutation(s) within the areas targeted by this assay, and inadequate number of viral  copies (<250 copies / mL). A negative result must be combined with clinical observations, patient history, and epidemiological information.  Fact Sheet for Patients:   StrictlyIdeas.no  Fact Sheet for Healthcare Providers: BankingDealers.co.za  This test is not yet approved or  cleared by the [place] FDA and has been authorized for detection and/or diagnosis of SARS-CoV-2 by FDA under an Emergency Use Authorization (EUA).  This EUA will remain in effect (meaning this test can be used) for the duration of the COVID-19 declaration under Section 564(b)(1) of the Act, 21 U.S.C. section 360bbb-3(b)(1), unless the authorization is terminated or revoked sooner.  Performed at [facility], [address], [place], [place] [address]          Radiology Studies: DG Chest Portable 1 View  Result Date: 12/04/2019 CLINICAL DATA:  74 year old female with history of shortness of breath. EXAM: PORTABLE CHEST 1 VIEW COMPARISON:  Chest x-ray 03/17/2019. FINDINGS: There is cephalization of the pulmonary vasculature and slight indistinctness of the interstitial markings suggestive of mild pulmonary edema. Small to moderate right and  small left pleural effusions. Bibasilar opacities which may reflect areas of atelectasis and/or consolidation. Mild cardiomegaly. Upper mediastinal contours are within normal limits. Aortic atherosclerosis. IMPRESSION: 1. The appearance the chest suggest congestive heart failure, as above. 2. In addition, there are bibasilar opacities which are favored to reflect areas of subsegmental atelectasis in the setting of bilateral pleural effusions, although underlying airspace consolidation is not excluded. 3. Aortic atherosclerosis. Electronically Signed   By: Vinnie Langton M.D.   On: 12/04/2019 04:30        Scheduled Meds: . apixaban  5 mg Oral BID  . calcium-vitamin D  1 tablet Oral BID  . diltiazem  240 mg  Oral Daily  . folic acid  1 mg Oral Daily  . furosemide  40 mg Oral Daily  . levothyroxine  75 mcg Oral Q0600  . metoprolol succinate  25 mg Oral Daily  . pantoprazole  40 mg Oral Daily  . sodium chloride flush  3 mL Intravenous Q12H   Continuous Infusions: . sodium chloride       LOS: 1 day    Time spent: 27 minutes    Sharen Hones, MD [facility]   To contact the attending provider between 7A-7P or the covering provider during after hours 7P-7A, please log into the web site [email or web address] and access using universal [place] password for that web site. If you do not have the password, please call the hospital operator.  12/05/2019, 8:13 AM

## 2019-12-05 NOTE — Plan of Care (Signed)
Pt watching CHF videos.  Understanding validated through teach back.  Tolerating fluid restriction.   Problem: Education: Goal: Knowledge of General Education information will improve Description: Including pain rating scale, medication(s)/side effects and non-pharmacologic comfort measures 12/05/2019 1122 by Phylis Bougie, RN Outcome: Progressing 12/05/2019 1121 by Phylis Bougie, RN Outcome: Progressing   Problem: Health Behavior/Discharge Planning: Goal: Ability to manage health-related needs will improve 12/05/2019 1122 by Phylis Bougie, RN Outcome: Progressing 12/05/2019 1121 by Phylis Bougie, RN Outcome: Progressing   Problem: Clinical Measurements: Goal: Ability to maintain clinical measurements within normal limits will improve 12/05/2019 1122 by Phylis Bougie, RN Outcome: Progressing 12/05/2019 1121 by Phylis Bougie, RN Outcome: Progressing Goal: Will remain free from infection 12/05/2019 1122 by Phylis Bougie, RN Outcome: Progressing 12/05/2019 1121 by Phylis Bougie, RN Outcome: Progressing Goal: Diagnostic test results will improve 12/05/2019 1122 by Phylis Bougie, RN Outcome: Progressing 12/05/2019 1121 by Phylis Bougie, RN Outcome: Progressing Goal: Respiratory complications will improve 12/05/2019 1122 by Phylis Bougie, RN Outcome: Progressing 12/05/2019 1121 by Phylis Bougie, RN Outcome: Progressing Goal: Cardiovascular complication will be avoided 12/05/2019 1122 by Phylis Bougie, RN Outcome: Progressing 12/05/2019 1121 by Phylis Bougie, RN Outcome: Progressing   Problem: Activity: Goal: Risk for activity intolerance will decrease 12/05/2019 1122 by Phylis Bougie, RN Outcome: Progressing 12/05/2019 1121 by Phylis Bougie, RN Outcome: Progressing   Problem: Nutrition: Goal: Adequate nutrition will be maintained 12/05/2019 1122 by Phylis Bougie, RN Outcome: Progressing 12/05/2019 1121  by Phylis Bougie, RN Outcome: Progressing   Problem: Coping: Goal: Level of anxiety will decrease 12/05/2019 1122 by Phylis Bougie, RN Outcome: Progressing 12/05/2019 1121 by Phylis Bougie, RN Outcome: Progressing   Problem: Elimination: Goal: Will not experience complications related to bowel motility 12/05/2019 1122 by Phylis Bougie, RN Outcome: Progressing 12/05/2019 1121 by Phylis Bougie, RN Outcome: Progressing Goal: Will not experience complications related to urinary retention 12/05/2019 1122 by Phylis Bougie, RN Outcome: Progressing 12/05/2019 1121 by Phylis Bougie, RN Outcome: Progressing   Problem: Pain Managment: Goal: General experience of comfort will improve 12/05/2019 1122 by Phylis Bougie, RN Outcome: Progressing 12/05/2019 1121 by Phylis Bougie, RN Outcome: Progressing   Problem: Safety: Goal: Ability to remain free from injury will improve 12/05/2019 1122 by Phylis Bougie, RN Outcome: Progressing 12/05/2019 1121 by Phylis Bougie, RN Outcome: Progressing   Problem: Skin Integrity: Goal: Risk for impaired skin integrity will decrease 12/05/2019 1122 by Phylis Bougie, RN Outcome: Progressing 12/05/2019 1121 by Phylis Bougie, RN Outcome: Progressing   Problem: Education: Goal: Ability to demonstrate management of disease process will improve Outcome: Progressing Goal: Ability to verbalize understanding of medication therapies will improve Outcome: Progressing Goal: Individualized Educational Video(s) Outcome: Progressing   Problem: Activity: Goal: Capacity to carry out activities will improve Outcome: Progressing   Problem: Cardiac: Goal: Ability to achieve and maintain adequate cardiopulmonary perfusion will improve Outcome: Progressing

## 2019-12-05 NOTE — Progress Notes (Signed)
CH received page because pt. requested prayer and a Bible; Pt. also called chaplain office requesting visit and Bible while CH was getting Bible; when CH arrived, pt. lying back in bed working on a crossword puzzle, energetic and conversational, in good spirits.  Pt. spoke warmly of her love for God and Scripture and her desire to see revival come to [place], as well as the conviction that revival must start first in her own heart.  She shared that she was raised Catholic and seriously considered becoming a nun, but then got married and had several children; she had a transformative spiritual encounter with God while at a [employer] here in [place] and 'I've never been the same since.'  Pt. appears well supported by her church and circle of friends and family; she has a 76yo prayer partner she calls every day at 1pm: 'We've seen a lot of prayers answered over the years,' pt. shared.  Pt. requested prayer for healing and that her life would be an example of God's power and goodness.  Pt. appreciated Bible; would likely appreciate follow-up visits.    12/04/19 1740  Clinical Encounter Type  Visited With Patient  Visit Type Initial;Spiritual support  Referral From Patient;Nurse  Consult/Referral To Chaplain  Spiritual Encounters  Spiritual Needs Prayer;Emotional;Sacred text  Stress Factors  Patient Stress Factors Health changes

## 2019-12-06 ENCOUNTER — Inpatient Hospital Stay
Admit: 2019-12-06 | Discharge: 2019-12-06 | Disposition: A | Discharge status: Home / Self Care | Payer: Medicare Other | Attending: Internal Medicine | Admitting: Internal Medicine

## 2019-12-06 LAB — BASIC METABOLIC PANEL
Anion gap: 10 (ref 5–15)
BUN: 16 mg/dL (ref 8–23)
CO2: 24 mmol/L (ref 22–32)
Calcium: 8.6 mg/dL — ABNORMAL LOW (ref 8.9–10.3)
Chloride: 99 mmol/L (ref 98–111)
Creatinine, Ser: 0.98 mg/dL (ref 0.44–1.00)
GFR calc Af Amer: >60 mL/min (ref >60)
GFR calc non Af Amer: 56 mL/min — ABNORMAL LOW (ref >60)
Glucose, Bld: 108 mg/dL — ABNORMAL HIGH (ref 70–99)
Potassium: 3.9 mmol/L (ref 3.5–5.1)
Sodium: 133 mmol/L — ABNORMAL LOW (ref 135–145)

## 2019-12-06 LAB — FIBRIN DERIVATIVES D-DIMER (ARMC ONLY): Fibrin derivatives D-dimer (ARMC): 1425.88 ng/mL (FEU) — ABNORMAL HIGH (ref 0.00–499.00)

## 2019-12-06 LAB — ECHOCARDIOGRAM COMPLETE
Height: 65 in
Weight: 2374.4 oz

## 2019-12-06 MED ORDER — SODIUM CHLORIDE 0.9 % IV SOLN: INTRAVENOUS | Status: DC

## 2019-12-06 MED ORDER — NITROGLYCERIN 0.4 MG SL SUBL
SUBLINGUAL_TABLET | SUBLINGUAL | Status: AC
  Administered 2019-12-06: 4 mg
  Filled 2019-12-06: qty 1

## 2019-12-06 MED ORDER — FUROSEMIDE 10 MG/ML IJ SOLN
40.0000 mg | Freq: Once | INTRAMUSCULAR | Status: AC
  Administered 2019-12-06: 40 mg via INTRAVENOUS
  Filled 2019-12-06: qty 4

## 2019-12-06 MED ORDER — NITROGLYCERIN 0.4 MG SL SUBL
0.4000 mg | SUBLINGUAL_TABLET | SUBLINGUAL | Status: DC | PRN
  Administered 2019-12-06: 0.4 mg via SUBLINGUAL

## 2019-12-06 NOTE — Progress Notes (Signed)
PROGRESS NOTE    [name]  NUU:[id number] DOB: [birth year] DOA: 12/04/2019 PCP: Elba Barman, MD   Chief complaint.  Shortness of breath.   Brief Narrative:  [name] a 74 y.o.femalewith medical history significant ofhypertension, stroke, hypothyroidism,dCHF, atrial fibrillation on Eliquis, anemia,IBS, whopresents with shortness breath and leg edema. Her chest x-ray showed pulmonary edema.  She was placed on IV Lasix for exacerbation of diastolic congestive heart failure. Reviewed most recent echocardiogram performed October 2020, showed normal ejection fraction, moderate pulmonary hypertension, mitral valve prolapse with severe regurgitation, calcified tricuspid valve.  7/6.  Patient developed an episode of short of breath and chest comfort earlier in the morning.  Angina versus symptoms of paroxysmal nocturnal dyspnea.  She still has some crackles in the base, will give another dose IV Lasix.  Obtain cardiology consult per patient request.  Also obtain pulmonology consult previously requested by her cardiologist.  DVT prophylaxis: Eliquis Code Status: Full Family Communication:  None Disposition Plan:   Patient came from: Home                                                                                                               Anticipated d/c place: We will discharge home tomorrow.  Barriers to d/c OR conditions which need to be met to effect a safe d/c:   Consultants:   None  Procedures: None Antimicrobials: None    Assessment & Plan:   Principal Problem:   Acute on chronic diastolic CHF (congestive heart failure) ([place]) Active Problems:   Atrial fibrillation (HCC)   Hypertension   Stroke ([place])   Hypothyroidism   Acute respiratory failure with hypoxia ([place])   #1. Acute on chronic diastolic congestive heart failure with a moderate pulmonary hypertension and a moderate to severe mitral regurgitation. Due to worsening short of  breath this morning, she will be given another dose of IV Lasix.  Her symptom was also better after nitroglycerin, is could be either angina versus an episode of paroxysmal nocturnal dyspnea.  Will obtain cardiology consult per patient request.  2.  Atrial fibrillation. Rate under control.  Continue anticoagulation.  3. Essential hypertension. Continue home medicines.  4.  Acute hypoxemic respite failure secondary to congestive heart failure. Continue to wean oxygen.    Subjective: Patient had baseline orthopnea, she woke up at 5 AM today, complaining short of breath and chest discomfort.  She was giving a dose of nitroglycerin, symptom relieved in 3 to 5 minutes.  She was also placed on oxygen at that time. She feels well this morning, still short of breath with exertion.  No cough. No nausea vomiting or diarrhea.  Objective: Vitals:   12/06/19 0600 12/06/19 0605 12/06/19 0610 12/06/19 0736  BP: 112/80 116/83 122/80 122/84  Pulse: 69 79 72 81  Resp:    18  Temp:    (!) 97.5 F (36.4 C)  TempSrc:    Oral  SpO2:    96%  Weight:      Height:  Intake/Output Summary (Last 24 hours) at 12/06/2019 0848 Last data filed at 12/06/2019 0736 Gross per 24 hour  Intake 840 ml  Output 1150 ml  Net -310 ml   Filed Weights   12/04/19 0401 12/05/19 0418 12/06/19 0330  Weight: 68 kg 66.3 kg 67.3 kg    Examination:  General exam: Appears calm and comfortable  Respiratory system: Fine crackles in the right lower base. Respiratory effort normal. Cardiovascular system: Irregular. No JVD, murmurs, rubs, gallops or clicks. 1+ pedal edema. Gastrointestinal system: Abdomen is nondistended, soft and nontender. No organomegaly or masses felt. Normal bowel sounds heard. Central nervous system: Alert and oriented. No focal neurological deficits. Extremities: Symmetric  Skin: No rashes, lesions or ulcers Psychiatry: Judgement and insight appear normal. Mood & affect appropriate.     Data  Reviewed: I have personally reviewed following labs and imaging studies  CBC: Recent Labs  Lab 12/04/19 0401 12/05/19 0509  WBC 9.5 8.5  NEUTROABS 7.2  --   HGB 10.2* 10.1*  HCT 33.2* 33.1*  MCV 71.4* 70.9*  PLT 266 290   Basic Metabolic Panel: Recent Labs  Lab 12/04/19 0401 12/05/19 0509 12/06/19 0537  NA 137 139 133*  K 3.7 3.7 3.9  CL 108 107 99  CO2 21* 25 24  GLUCOSE 136* 94 108*  BUN _0 CREATININE 0.86 0.81 0.98  CALCIUM 8.2* 8.4* 8.6*  MG  --  2.0  --    GFR: Estimated Creatinine Clearance: 44.6 mL/min (by C-G formula based on SCr of 0.98 mg/dL). Liver Function Tests: Recent Labs  Lab 12/04/19 0401  AST 45*  ALT 33  ALKPHOS 93  BILITOT 0.9  PROT 6.6  ALBUMIN 3.2*   No results for input(s): LIPASE, AMYLASE in the last 168 hours. No results for input(s): AMMONIA in the last 168 hours. Coagulation Profile: No results for input(s): INR, PROTIME in the last 168 hours. Cardiac Enzymes: No results for input(s): CKTOTAL, CKMB, CKMBINDEX, TROPONINI in the last 168 hours. BNP (last 3 results) No results for input(s): PROBNP in the last 8760 hours. HbA1C: No results for input(s): HGBA1C in the last 72 hours. CBG: No results for input(s): GLUCAP in the last 168 hours. Lipid Profile: No results for input(s): CHOL, HDL, LDLCALC, TRIG, CHOLHDL, LDLDIRECT in the last 72 hours. Thyroid Function Tests: No results for input(s): TSH, T4TOTAL, FREET4, T3FREE, THYROIDAB in the last 72 hours. Anemia Panel: No results for input(s): VITAMINB12, FOLATE, FERRITIN, TIBC, IRON, RETICCTPCT in the last 72 hours. Sepsis Labs: No results for input(s): PROCALCITON, LATICACIDVEN in the last 168 hours.  Recent Results (from the past 240 hour(s))  SARS Coronavirus 2 by RT PCR (hospital order, performed in [facility] hospital lab) Nasopharyngeal Nasopharyngeal Swab     Status: None   Collection Time: 12/04/19  5:10 AM   Specimen: Nasopharyngeal Swab  Result Value Ref  Range Status   SARS Coronavirus 2 NEGATIVE NEGATIVE Final    Comment: (NOTE) SARS-CoV-2 target nucleic acids are NOT DETECTED.  The SARS-CoV-2 RNA is generally detectable in upper and lower respiratory specimens during the acute phase of infection. The lowest concentration of SARS-CoV-2 viral copies this assay can detect is 250 copies / mL. A negative result does not preclude SARS-CoV-2 infection and should not be used as the sole basis for treatment or other patient management decisions.  A negative result may occur with improper specimen collection / handling, submission of specimen other than nasopharyngeal swab, presence of viral mutation(s) within the  areas targeted by this assay, and inadequate number of viral copies (<250 copies / mL). A negative result must be combined with clinical observations, patient history, and epidemiological information.  Fact Sheet for Patients:   StrictlyIdeas.no  Fact Sheet for Healthcare Providers: BankingDealers.co.za  This test is not yet approved or  cleared by the [place] FDA and has been authorized for detection and/or diagnosis of SARS-CoV-2 by FDA under an Emergency Use Authorization (EUA).  This EUA will remain in effect (meaning this test can be used) for the duration of the COVID-19 declaration under Section 564(b)(1) of the Act, 21 U.S.C. section 360bbb-3(b)(1), unless the authorization is terminated or revoked sooner.  Performed at [facility], [address] [place], [place] [address]          Radiology Studies: No results found.      Scheduled Meds:  apixaban  5 mg Oral BID   calcium-vitamin D  1 tablet Oral BID   diltiazem  240 mg Oral Daily   folic acid  1 mg Oral Daily   furosemide  40 mg Intravenous Once   furosemide  40 mg Oral Daily   levothyroxine  75 mcg Oral Q0600   metoprolol succinate  25 mg Oral Daily   pantoprazole  40 mg Oral  Daily   sodium chloride flush  3 mL Intravenous Q12H   Continuous Infusions:  sodium chloride     sodium chloride       LOS: 2 days    Time spent: 27 minutes    Sharen Hones, MD [facility]   To contact the attending provider between 7A-7P or the covering provider during after hours 7P-7A, please log into the web site [email or web address] and access using universal [place] password for that web site. If you do not have the password, please call the hospital operator.  12/06/2019, 8:48 AM

## 2019-12-06 NOTE — Consult Note (Signed)
CARDIOLOGY CONSULT NOTE               Patient ID: [name] MRN: [id number] DOB/AGE: March 21, 1946 74 y.o.  Admit date: 12/04/2019 Referring Physician Dr. Marrion Coy  Primary Physician Dr. Pilar Grammes  Primary Cardiologist Dr. Darrold Junker  Reason for Consultation Chest pain  HPI: [name] is a 74 year old female with a past medical history significant for paroxsymal atrial fibrillation, anticoagulated with Eliquis, HFpEF, mitral valve prolapse with moderate to severe MR, history of a CVA, pulmonary hypertension, and essential hypertension who presented to the ED on 12/04/19 with a few day history of progressive exertional dyspnea, lower extremity swelling, and orthopnea.  Workup was significant for chest xray revealing mild pulmonary edema, small to moderate right pleural effusion and a small left pleural effusion, ECG revealing atrial fibrillation at a ventricular rate of 100bpm, BNP of 974, and high sensitivity troponin negative x 1.    Overall, symptoms have significantly improved since admission.  She experienced an intense episode of chest tightness this morning that quickly resolved with nitroglycerin and resuming supplemental O2.  She currently denies chest pain, chest pressure, palpitations, shortness of breath, orthopnea, or lower extremity swelling.   She is followed in outpatient cardiology by Dr. Darrold Junker.  Most recent echocardiogram on 03/18/19 revealed normal LV systolic function with an EF estimated between 60-65% with moderate mitral valve prolapse with moderate to severe MR, moderate calcification of the aortic valve, and moderately elevated pulmonary pressures.   Review of systems complete and found to be negative unless listed above     Past Medical History:  Diagnosis Date  . Acute respiratory failure (HCC)    Secondary to aspiration pneumonia   . Altered mental status    Secondary to viral herpes simplex virus encephalitis  . Anemia   . Atrial fibrillation  (HCC)   . Bilateral pneumonia   . CHF (congestive heart failure) (HCC)   . Dysphasia   . Encephalitis   . Hyperglycemia   . Hypertension   . Hyponatremia   . Seizures (HCC)   . Stroke [facility])     Past Surgical History:  Procedure Laterality Date  . CHOLECYSTECTOMY    . COLONOSCOPY WITH PROPOFOL N/A 12/31/2016   Procedure: COLONOSCOPY WITH PROPOFOL;  Surgeon: Scot Jun, MD;  Location: [facility] ENDOSCOPY;  Service: Endoscopy;  Laterality: N/A;  . ESOPHAGOGASTRODUODENOSCOPY (EGD) WITH PROPOFOL N/A 12/31/2016   Procedure: ESOPHAGOGASTRODUODENOSCOPY (EGD) WITH PROPOFOL;  Surgeon: Scot Jun, MD;  Location: [facility] ENDOSCOPY;  Service: Endoscopy;  Laterality: N/A;  . TONSILLECTOMY    . TUBAL LIGATION      Medications Prior to Admission  Medication Sig Dispense Refill Last Dose  . apixaban (ELIQUIS) 5 MG TABS tablet Take 1 tablet (5 mg total) by mouth 2 (two) times daily. 60 tablet 0 12/03/2019 at 2000  . Calcium Carb-Cholecalciferol (CALCIUM-VITAMIN D) 500-200 MG-UNIT tablet Take 1 tablet by mouth 2 (two) times daily.   12/03/2019 at Unknown time  . diltiazem (CARDIZEM CD) 240 MG 24 hr capsule Take 1 capsule (240 mg total) by mouth daily. 30 capsule 0 12/03/2019 at 1000  . folic acid (FOLVITE) 1 MG tablet Take 1 mg by mouth daily. Per tube once daily     12/03/2019 at Unknown time  . furosemide (LASIX) 20 MG tablet Take 20 mg by mouth daily.   12/03/2019 at Unknown time  . guaiFENesin (MUCINEX) 600 MG 12 hr tablet Take 600 mg by mouth 2 (two) times daily.  12/03/2019 at Unknown time  . levothyroxine (SYNTHROID) 75 MCG tablet Take 75 mcg by mouth daily before breakfast.    12/03/2019 at Unknown time  . metoprolol succinate (TOPROL-XL) 25 MG 24 hr tablet Take 25 mg by mouth daily.   12/03/2019 at Unknown time  . omeprazole (PRILOSEC) 20 MG capsule Take 20 mg by mouth daily.   12/03/2019 at Unknown time  . potassium chloride (KLOR-CON) 10 MEQ tablet Take 10 mEq by mouth daily.   12/03/2019 at Unknown time  .  aspirin 81 MG tablet Take 81 mg by mouth daily.   (Patient not taking: Reported on 12/04/2019)   Not Taking at Unknown time  . Loperamide HCl (IMODIUM PO) Take by mouth as needed. (Patient not taking: Reported on 12/04/2019)   Not Taking at Unknown time  . meclizine (ANTIVERT) 12.5 MG tablet Take 1 tablet (12.5 mg total) by mouth 2 (two) times daily as needed for dizziness. (Patient not taking: Reported on 12/04/2019) 10 tablet 0 Not Taking at Unknown time   Social History   Socioeconomic History  . Marital status: Widowed    Spouse name: Not on file  . Number of children: 2  . Years of education: Not on file  . Highest education level: Not on file  Occupational History  . Occupation: medical transcript  Tobacco Use  . Smoking status: Never Smoker  . Smokeless tobacco: Never Used  Vaping Use  . Vaping Use: Never used  Substance and Sexual Activity  . Alcohol use: No  . Drug use: No  . Sexual activity: Never  Other Topics Concern  . Not on file  Social History Narrative  . Not on file   Social Determinants of Health   Financial Resource Strain: Low Risk   . Difficulty of Paying Living Expenses: Not hard at all  Food Insecurity: No Food Insecurity  . Worried About Programme researcher, broadcasting/film/video in the Last Year: Never true  . Ran Out of Food in the Last Year: Never true  Transportation Needs: No Transportation Needs  . Lack of Transportation (Medical): No  . Lack of Transportation (Non-Medical): No  Physical Activity: Insufficiently Active  . Days of Exercise per Week: 2 days  . Minutes of Exercise per Session: 30 min  Stress: No Stress Concern Present  . Feeling of Stress : Not at all  Social Connections: Moderately Isolated  . Frequency of Communication with Friends and Family: More than three times a week  . Frequency of Social Gatherings with Friends and Family: More than three times a week  . Attends Religious Services: 1 to 4 times per year  . Active Member of Clubs or  Organizations: No  . Attends Banker Meetings: Never  . Marital Status: Widowed  Intimate Partner Violence: Not At Risk  . Fear of Current or Ex-Partner: No  . Emotionally Abused: No  . Physically Abused: No  . Sexually Abused: No    Family History  Problem Relation Age of Onset  . Prostate cancer Father   . Breast cancer Maternal Aunt   . Breast cancer Maternal Aunt   . Cerebral palsy Son       Review of systems complete and found to be negative unless listed above      PHYSICAL EXAM  General: Well developed, well nourished, in no acute distress HEENT:  Normocephalic and atramatic Neck:  No JVD.  Lungs: Clear bilaterally to auscultation and percussion. Heart: Irregularly irregular, rate controlled . Normal S1  and S2 without gallops or murmurs.  Abdomen: Bowel sounds are positive, abdomen soft and non-tender  Msk:  Back normal.  Normal strength and tone for age. Extremities: No clubbing, cyanosis or edema.   Neuro: Alert and oriented X 3. Psych:  Good affect, responds appropriately  Labs:   Lab Results  Component Value Date   WBC 8.5 12/05/2019   HGB 10.1 (L) 12/05/2019   HCT 33.1 (L) 12/05/2019   MCV 70.9 (L) 12/05/2019   PLT 254 12/05/2019    Recent Labs  Lab 12/04/19 0401 12/05/19 0509 12/06/19 0537  NA 137   < > 133*  K 3.7   < > 3.9  CL 108   < > 99  CO2 21*   < > 24  BUN 14   < > 16  CREATININE 0.86   < > 0.98  CALCIUM 8.2*   < > 8.6*  PROT 6.6  --   --   BILITOT 0.9  --   --   ALKPHOS 93  --   --   ALT 33  --   --   AST 45*  --   --   GLUCOSE 136*   < > 108*   < > = values in this interval not displayed.   Lab Results  Component Value Date   CKTOTAL 70 08/29/2011   CKMB 3.3 08/29/2011   TROPONINI 0.07 (HH) 07/05/2017    Lab Results  Component Value Date   CHOL 159 03/17/2019   CHOL 222 (H) 08/28/2011   Lab Results  Component Value Date   HDL 46 03/17/2019   HDL 55 08/28/2011   Lab Results  Component Value Date    LDLCALC 92 03/17/2019   LDLCALC 142 (H) 08/28/2011   Lab Results  Component Value Date   TRIG 103 03/17/2019   TRIG 127 08/28/2011   Lab Results  Component Value Date   CHOLHDL 3.5 03/17/2019   No results found for: LDLDIRECT    Radiology: DG Chest Portable 1 View  Result Date: 12/04/2019 CLINICAL DATA:  74 year old female with history of shortness of breath. EXAM: PORTABLE CHEST 1 VIEW COMPARISON:  Chest x-ray 03/17/2019. FINDINGS: There is cephalization of the pulmonary vasculature and slight indistinctness of the interstitial markings suggestive of mild pulmonary edema. Small to moderate right and small left pleural effusions. Bibasilar opacities which may reflect areas of atelectasis and/or consolidation. Mild cardiomegaly. Upper mediastinal contours are within normal limits. Aortic atherosclerosis. IMPRESSION: 1. The appearance the chest suggest congestive heart failure, as above. 2. In addition, there are bibasilar opacities which are favored to reflect areas of subsegmental atelectasis in the setting of bilateral pleural effusions, although underlying airspace consolidation is not excluded. 3. Aortic atherosclerosis. Electronically Signed   By: Trudie Reed M.D.   On: 12/04/2019 04:30    EKG: Atrial fibrillation at a ventricular rate of 100bpm   ASSESSMENT AND PLAN:   1.  Atrial fibrillation   -Rate well controlled; continue diltiazem 240mg  daily and metoprolol 25mg  once daily   -Continue Eliquis 5mg  BID   2.  History of HFpEF   -Most recent echo revealing preserved LV systolic function, echo during this admission pending   -Has diuresed 3.1 Liters since admission; would agree with additional Lasix injection and continue 40mg  daily   3.  Mitral valve prolapse with moderate to severe MR  -Echocardiogram pending    4.  Chest pain   -Improved after nitroglycerin and resuming supplemental O2   -Will follow  for recurrent symptoms and consider discharge tomorrow with close  outpatient follow up   The history, physical exam findings, and plan of care were all discussed with Dr. Harold HedgeKenneth Fath, and all decision making was made in collaboration.   Signed: Andi HenceNicole L Ivalee Strauser PA-C 12/06/2019, 8:31 AM

## 2019-12-06 NOTE — Progress Notes (Signed)
*  PRELIMINARY RESULTS* Echocardiogram 2D Echocardiogram has been performed.  Joanette Gula Alfie Rideaux 12/06/2019, 12:22 PM

## 2019-12-06 NOTE — Consult Note (Signed)
Pulmonary Medicine          Date: 12/06/2019,   MRN# [id number] [name] 1946-01-27     AdmissionWeight: 68 kg                 CurrentWeight: 67.3 kg  Refering physician: Dr Chipper Herb    CHIEF COMPLAINT:   Increased O2 requirement with pulmonary edema   HISTORY OF PRESENT ILLNESS   74 F with hx of CVA, HFpEF, hypothyroidism, AF, LE pitting edema chronically who came in with acute on chronic shortness of breath.  Patient has been seen and managed by cardiology but she shares that she has been having unusual SOB with chest tightness despite full compliance with cardiac regimen as well as significant improvement in leg edema. She call life alert for emergent help due to sudden onset SOB.  She smoked in the past at age 74 but has been smoke free now for 62 years. Her children do not smoke and she denies asthma. 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 She shares that she can no longer participate in many previous activities due to SOB. She has able to do PT thrice weekly but prior to admission she was unable to even help with decorations at church.      PAST MEDICAL HISTORY   Past Medical History:  Diagnosis Date  . Acute respiratory failure (HCC)    Secondary to aspiration pneumonia   . Altered mental status    Secondary to viral herpes simplex virus encephalitis  . Anemia   . Atrial fibrillation (HCC)   . Bilateral pneumonia   . CHF (congestive heart failure) (HCC)   . Dysphasia   . Encephalitis   . Hyperglycemia   . Hypertension   . Hyponatremia   . Seizures (HCC)   . Stroke [facility])      SURGICAL HISTORY   Past Surgical History:  Procedure Laterality Date  . CHOLECYSTECTOMY    . COLONOSCOPY WITH PROPOFOL N/A 12/31/2016   Procedure: COLONOSCOPY WITH PROPOFOL;  Surgeon: Scot Jun, MD;  Location: [facility] ENDOSCOPY;  Service: Endoscopy;   Laterality: N/A;  . ESOPHAGOGASTRODUODENOSCOPY (EGD) WITH PROPOFOL N/A 12/31/2016   Procedure: ESOPHAGOGASTRODUODENOSCOPY (EGD) WITH PROPOFOL;  Surgeon: Scot Jun, MD;  Location: [facility] ENDOSCOPY;  Service: Endoscopy;  Laterality: N/A;  . TONSILLECTOMY    . TUBAL LIGATION       FAMILY HISTORY   Family History  Problem Relation Age of Onset  . Prostate cancer Father   . Breast cancer Maternal Aunt   . Breast cancer Maternal Aunt   . Cerebral palsy Son      SOCIAL HISTORY   Social History   Tobacco Use  . Smoking status: Never Smoker  . Smokeless tobacco: Never Used  Vaping Use  . Vaping Use: Never used  Substance Use Topics  . Alcohol use: No  . Drug use: No     MEDICATIONS    Home Medication:    Current Medication:  Current Facility-Administered Medications:  .  0.9 %  sodium chloride infusion, 250 mL, Intravenous, PRN, Lorretta Harp, MD .  0.9 %  sodium chloride infusion, , Intravenous, Continuous, Marrion Coy, MD .  acetaminophen (TYLENOL) tablet 650 mg, 650 mg, Oral, Q6H PRN, Lorretta Harp, MD, 650 mg at 12/06/19 0416 .  albuterol (PROVENTIL) (2.5 MG/3ML) 0.083% nebulizer solution 2.5 mg, 2.5 mg, Inhalation,  Q4H PRN, Lorretta Harp, MD .  apixaban Everlene Balls) tablet 5 mg, 5 mg, Oral, BID, Lorretta Harp, MD, 5 mg at 12/06/19 0846 .  calcium-vitamin D (OSCAL WITH D) 500-200 MG-UNIT per tablet 1 tablet, 1 tablet, Oral, BID, Lorretta Harp, MD, 1 tablet at 12/06/19 0846 .  dextromethorphan-guaiFENesin (MUCINEX DM) 30-600 MG per 12 hr tablet 1 tablet, 1 tablet, Oral, BID PRN, Lorretta Harp, MD, 1 tablet at 12/05/19 2118 .  diltiazem (CARDIZEM CD) 24 hr capsule 240 mg, 240 mg, Oral, Daily, Lorretta Harp, MD, 240 mg at 12/06/19 0845 .  folic acid (FOLVITE) tablet 1 mg, 1 mg, Oral, Daily, Lorretta Harp, MD, 1 mg at 12/06/19 0846 .  furosemide (LASIX) injection 40 mg, 40 mg, Intravenous, Once, Marrion Coy, MD .  furosemide (LASIX) tablet 40 mg, 40 mg, Oral, Daily, Marrion Coy, MD, 40 mg at  12/06/19 0846 .  hydrALAZINE (APRESOLINE) injection 5 mg, 5 mg, Intravenous, Q2H PRN, Lorretta Harp, MD .  levothyroxine (SYNTHROID) tablet 75 mcg, 75 mcg, Oral, Q0600, Lorretta Harp, MD, 75 mcg at 12/06/19 0456 .  metoprolol succinate (TOPROL-XL) 24 hr tablet 25 mg, 25 mg, Oral, Daily, Lorretta Harp, MD, 25 mg at 12/06/19 0846 .  nitroGLYCERIN (NITROSTAT) SL tablet 0.4 mg, 0.4 mg, Sublingual, Q5 min PRN, Marrion Coy, MD, 0.4 mg at 12/06/19 0410 .  ondansetron (ZOFRAN) injection 4 mg, 4 mg, Intravenous, Q8H PRN, Lorretta Harp, MD .  pantoprazole (PROTONIX) EC tablet 40 mg, 40 mg, Oral, Daily, Lorretta Harp, MD, 40 mg at 12/06/19 0846 .  sodium chloride flush (NS) 0.9 % injection 3 mL, 3 mL, Intravenous, Q12H, Lorretta Harp, MD, 3 mL at 12/06/19 0846 .  sodium chloride flush (NS) 0.9 % injection 3 mL, 3 mL, Intravenous, PRN, Lorretta Harp, MD, 3 mL at 12/05/19 2101    ALLERGIES   Prednisone and Predicort [prednisolone acetate]     REVIEW OF SYSTEMS    Review of Systems:  Gen:  Denies  fever, sweats, chills weigh loss  HEENT: Denies blurred vision, double vision, ear pain, eye pain, hearing loss, nose bleeds, sore throat Cardiac:  No dizziness, chest pain or heaviness, chest tightness,edema Resp:   Denies cough or sputum porduction, shortness of breath,wheezing, hemoptysis,  Gi: Denies swallowing difficulty, stomach pain, nausea or vomiting, diarrhea, constipation, bowel incontinence Gu:  Denies bladder incontinence, burning urine Ext:   Denies Joint pain, stiffness or swelling Skin: Denies  skin rash, easy bruising or bleeding or hives Endoc:  Denies polyuria, polydipsia , polyphagia or weight change Psych:   Denies depression, insomnia or hallucinations   Other:  All other systems negative   VS: BP 113/71 (BP Location: Left Arm)   Pulse 88   Temp 97.7 F (36.5 C) (Oral)   Resp 18   Ht 5\' 5"  (1.651 m)   Wt 67.3 kg   SpO2 100%   BMI 24.70 kg/m      PHYSICAL EXAM    GENERAL:NAD, no  fevers, chills, no weakness no fatigue HEAD: Normocephalic, atraumatic.  EYES: Pupils equal, round, reactive to light. Extraocular muscles intact. No scleral icterus.  MOUTH: Moist mucosal membrane. Dentition intact. No abscess noted.  EAR, NOSE, THROAT: Clear without exudates. No external lesions.  NECK: Supple. No thyromegaly. No nodules. No JVD.  PULMONARY: clear to auscultation with loud systolic irregular murmur.  CARDIOVASCULAR: S1 and S2. Regular rate and rhythm. No murmurs, rubs, or gallops. No edema. Pedal pulses 2+ bilaterally.  GASTROINTESTINAL: Soft, nontender, nondistended. No masses. Positive bowel  sounds. No hepatosplenomegaly.  MUSCULOSKELETAL: No swelling, clubbing, or edema. Range of motion full in all extremities.  NEUROLOGIC: Cranial nerves II through XII are intact. No gross focal neurological deficits. Sensation intact. Reflexes intact.  SKIN: No ulceration, lesions, rashes, or cyanosis. Skin warm and dry. Turgor intact.  PSYCHIATRIC: Mood, affect within normal limits. The patient is awake, alert and oriented x 3. Insight, judgment intact.       IMAGING    DG Chest Portable 1 View  Result Date: 12/04/2019 CLINICAL DATA:  74 year old female with history of shortness of breath. EXAM: PORTABLE CHEST 1 VIEW COMPARISON:  Chest x-ray 03/17/2019. FINDINGS: There is cephalization of the pulmonary vasculature and slight indistinctness of the interstitial markings suggestive of mild pulmonary edema. Small to moderate right and small left pleural effusions. Bibasilar opacities which may reflect areas of atelectasis and/or consolidation. Mild cardiomegaly. Upper mediastinal contours are within normal limits. Aortic atherosclerosis. IMPRESSION: 1. The appearance the chest suggest congestive heart failure, as above. 2. In addition, there are bibasilar opacities which are favored to reflect areas of subsegmental atelectasis in the setting of bilateral pleural effusions, although  underlying airspace consolidation is not excluded. 3. Aortic atherosclerosis. Electronically Signed   By: Trudie Reed M.D.   On: 12/04/2019 04:30      ASSESSMENT/PLAN   Acute hypoxemic respiratory failure   -presumably due to Acute on chronic HFrEF with pulmonary edema and atelectasis as well as AF  - continue lasix as scheduled   - supplemental O2 as needed   - SOB may be related to AF vs pulmonary edema  - consider RHC to evaluate PH status  - Incentive spirometer q1H,     Bilateral basal predominant atelectasis   - patient will need PT/OT   - Chest physiotherapy q4hx1d     Thank you for allowing me to participate in the care of this patient.   Patient/Family are satisfied with care plan and all questions have been answered.   This document was prepared using Dragon voice recognition software and may include unintentional dictation errors.     Vida Rigger, M.D.  Division of Pulmonary & Critical Care Medicine  [facility]

## 2019-12-06 NOTE — Plan of Care (Signed)
Awaiting completion of echocardiogram and consultation by cardiology and pulmonology.  Currently denies chest discomfort, SOB, N/V.  Wearing oxygen at 2 L/min via [place] for comfort.   Problem: Education: Goal: Knowledge of General Education information will improve Description: Including pain rating scale, medication(s)/side effects and non-pharmacologic comfort measures Outcome: Progressing   Problem: Health Behavior/Discharge Planning: Goal: Ability to manage health-related needs will improve Outcome: Progressing   Problem: Clinical Measurements: Goal: Ability to maintain clinical measurements within normal limits will improve Outcome: Progressing Goal: Will remain free from infection Outcome: Progressing Goal: Diagnostic test results will improve Outcome: Progressing Goal: Respiratory complications will improve Outcome: Progressing Goal: Cardiovascular complication will be avoided Outcome: Progressing   Problem: Activity: Goal: Risk for activity intolerance will decrease Outcome: Progressing   Problem: Nutrition: Goal: Adequate nutrition will be maintained Outcome: Progressing   Problem: Coping: Goal: Level of anxiety will decrease Outcome: Progressing   Problem: Elimination: Goal: Will not experience complications related to bowel motility Outcome: Progressing Goal: Will not experience complications related to urinary retention Outcome: Progressing   Problem: Pain Managment: Goal: General experience of comfort will improve Outcome: Progressing   Problem: Safety: Goal: Ability to remain free from injury will improve Outcome: Progressing   Problem: Skin Integrity: Goal: Risk for impaired skin integrity will decrease Outcome: Progressing   Problem: Education: Goal: Ability to demonstrate management of disease process will improve Outcome: Progressing Goal: Ability to verbalize understanding of medication therapies will improve Outcome: Progressing Goal:  Individualized Educational Video(s) Outcome: Progressing   Problem: Activity: Goal: Capacity to carry out activities will improve Outcome: Progressing   Problem: Cardiac: Goal: Ability to achieve and maintain adequate cardiopulmonary perfusion will improve Outcome: Progressing

## 2019-12-06 NOTE — Progress Notes (Signed)
Patient c/o chest pain 2/10 with pressure, SHOB.Gave NTG sublingual. O2 at 2liters 96%. EKG performed; shows a-fib (chronic). Pain at 1/10 now. Will administer Tylenol for pain at this time. V/S stable. Patient awaiting Echo today.She reports that pressure and shortness of breath subsided now. Will continue to monitor.

## 2019-12-06 NOTE — TOC Initial Note (Addendum)
Transition of Care [facility]) - Initial/Assessment Note    Patient Details  Name: [name] MRN: [id number] Date of Birth: [birth year]  Transition of [facility]) CM/SW Contact:    Maree Krabbe, LCSW Phone Number: 12/06/2019, 9:29 AM  Clinical Narrative:    Pt is alert and oriented. Pt lives home alone. Pt lives at [facility] in [place]. Pt states she would like to have the same therapist she got the last time from [facility]. Pt states his name was [place]. CSW will reach out to [place] with [facility].   Pt states she has a scale and a blood pressure cuff at home. Pt states she weighs herself daily at 6 AM and takes her blood pressure and makes note of it. Pt is aware if there is any weight gain to notify her MD. Follow up apt will be made.              Expected Discharge Plan: Home w Home Health Services Barriers to Discharge: Continued Medical Work up   Patient Goals and CMS Choice Patient states their goals for this hospitalization and ongoing recovery are:: to get better   Choice offered to / list presented to : Patient  Expected Discharge Plan and Services Expected Discharge Plan: Home w Home Health Services In-house Referral: Clinical Social Work   Post Acute Care Choice: Home Health Living arrangements for the past 2 months: Single Family Home                           HH Arranged: PT          Prior Living Arrangements/Services Living arrangements for the past 2 months: Single Family Home Lives with:: Self Patient language and need for interpreter reviewed:: Yes Do you feel safe going back to the place where you live?: Yes      Need for Family Participation in Patient Care: Yes (Comment) Care giver support system in place?: Yes (comment)   Criminal Activity/Legal Involvement Pertinent to Current Situation/Hospitalization: No - Comment as needed  Activities of Daily Living Home Assistive Devices/Equipment: Dentures (specify type), Eyeglasses,  Cane (specify quad or straight) ADL Screening (condition at time of admission) Patient's cognitive ability adequate to safely complete daily activities?: Yes Is the patient deaf or have difficulty hearing?: No Does the patient have difficulty seeing, even when wearing glasses/contacts?: No Does the patient have difficulty concentrating, remembering, or making decisions?: No Patient able to express need for assistance with ADLs?: Yes Does the patient have difficulty dressing or bathing?: No Independently performs ADLs?: Yes (appropriate for developmental age) Does the patient have difficulty walking or climbing stairs?: No Weakness of Legs: None Weakness of Arms/Hands: None  Permission Sought/Granted Permission sought to share information with : Family Supports Permission granted to share information with : Yes, Verbal Permission Granted  Share Information with NAME: [name]  Permission granted to share info w AGENCY: [facility]  Permission granted to share info w Relationship: son/POA     Emotional Assessment Appearance:: Appears stated age Attitude/Demeanor/Rapport: Engaged Affect (typically observed): Accepting, Appropriate Orientation: : Oriented to Self, Oriented to Place, Oriented to  Time, Oriented to Situation Alcohol / Substance Use: Not Applicable Psych Involvement: No (comment)  Admission diagnosis:  Acute CHF (congestive heart failure) (HCC) [I50.9] Acute on chronic congestive heart failure, unspecified heart failure type (HCC) [I50.9] Acute on chronic diastolic CHF (congestive heart failure) (HCC) [I50.33] Patient Active Problem List   Diagnosis Date Noted  .  Acute on chronic diastolic CHF (congestive heart failure) (HCC) 12/04/2019  . Atrial fibrillation (HCC)   . Hypertension   . Stroke (HCC)   . Hypothyroidism   . Acute respiratory failure with hypoxia (HCC)   . Acute congestive heart failure (HCC) 03/17/2019  . Acute gastroenteritis 07/05/2017  . Acute  respiratory failure (HCC) 03/27/2011   PCP:  Mickel Fuchs, MD Pharmacy:   [facility] - [place], [place] - [address] [place] [address] Phone: [phone] Fax: [phone]  [facility] - [place], [place] - [address] [place] [address] Phone: [phone] Fax: [phone]     Social Determinants of Health (SDOH) Interventions    Readmission Risk Interventions No flowsheet data found.

## 2019-12-07 LAB — BASIC METABOLIC PANEL
Anion gap: 10 (ref 5–15)
BUN: 18 mg/dL (ref 8–23)
CO2: 27 mmol/L (ref 22–32)
Calcium: 8.7 mg/dL — ABNORMAL LOW (ref 8.9–10.3)
Chloride: 100 mmol/L (ref 98–111)
Creatinine, Ser: 0.91 mg/dL (ref 0.44–1.00)
GFR calc Af Amer: >60 mL/min (ref >60)
GFR calc non Af Amer: >60 mL/min (ref >60)
Glucose, Bld: 100 mg/dL — ABNORMAL HIGH (ref 70–99)
Potassium: 3.5 mmol/L (ref 3.5–5.1)
Sodium: 137 mmol/L (ref 135–145)

## 2019-12-07 LAB — CBC
HCT: 35.2 % — ABNORMAL LOW (ref 36.0–46.0)
Hemoglobin: 10.8 g/dL — ABNORMAL LOW (ref 12.0–15.0)
MCH: 21.7 pg — ABNORMAL LOW (ref 26.0–34.0)
MCHC: 30.7 g/dL (ref 30.0–36.0)
MCV: 70.8 fL — ABNORMAL LOW (ref 80.0–100.0)
Platelets: 274 10*3/uL (ref 150–400)
RBC: 4.97 MIL/uL (ref 3.87–5.11)
RDW: 16.5 % — ABNORMAL HIGH (ref 11.5–15.5)
WBC: 9.3 10*3/uL (ref 4.0–10.5)
nRBC: 0 % (ref 0.0–0.2)

## 2019-12-07 LAB — MAGNESIUM: Magnesium: 2.1 mg/dL (ref 1.7–2.4)

## 2019-12-07 LAB — BRAIN NATRIURETIC PEPTIDE: B Natriuretic Peptide: 686.2 pg/mL — ABNORMAL HIGH (ref 0.0–100.0)

## 2019-12-07 MED ORDER — FUROSEMIDE 40 MG PO TABS: 40.0000 mg | ORAL_TABLET | Freq: Every day | ORAL | 1 refills | Status: DC

## 2019-12-07 NOTE — Care Management Important Message (Signed)
Important Message  Patient Details  Name: [name] MRN: [id number] Date of Birth: November 21, 1945   Medicare Important Message Given:  Yes     [name] 12/07/2019, 11:06 AM

## 2019-12-07 NOTE — TOC Progression Note (Signed)
Transition of Care [facility]) - Progression Note    Patient Details  Name: [name] MRN: [id number] Date of Birth: 11-09-1945  [facility] CM/SW Contact  Maree Krabbe, LCSW Phone Number: 12/07/2019, 1:05 PM  Clinical Narrative: CSW has reached out to [facility], [facility], [facility], [facility] [facility], and [facility] and no agency is able to service pt. CSW spoke with pt and pt is understanding. Per Pt's permission CSW sent referral for outpatient PT.       Expected Discharge Plan: Home w Home Health Services Barriers to Discharge: Continued Medical Work up  Expected Discharge Plan and Services Expected Discharge Plan: Home w Home Health Services In-house Referral: Clinical Social Work   Post Acute Care Choice: Home Health Living arrangements for the past 2 months: Single Family Home Expected Discharge Date: 12/07/19                         HH Arranged: PT           Social Determinants of Health (SDOH) Interventions    Readmission Risk Interventions No flowsheet data found.

## 2019-12-07 NOTE — Progress Notes (Signed)
Pt was discharged at this time. Left Hand PIV was removed. Pt verbalized understanding of discharge paperwork. Pt was picked up by Niece, Rosario Jacks. Pt was escorted off the unit by volunteer via wheelchair.

## 2019-12-07 NOTE — Progress Notes (Signed)
OT Screen Note  Patient Details Name: [name] MRN: [id number] DOB: 17-Aug-1945   Cancelled Treatment:    Reason Eval/Treat Not Completed: OT screened, no needs identified, will sign off. New OT consult received. Met with pt. Pt reports feeling better. Spoke with pt regarding benefits of [place] services to support her return to meaningful activities such as church while minimizing SOB/over exertion in addition to chronic disease mgt, incorporating energy conservation strategies, and falls prevention. Case mgt notified of recommendation. Recommending HHOT to best suit pt's needs. No acute OT needs identified at this time. Will sign off. Thank you for this consult.   Jeni Salles, MPH, MS, OTR/L ascom [phone] 12/07/19, 8:55 AM

## 2019-12-07 NOTE — Discharge Summary (Signed)
Physician Discharge Summary [facility]    Patient: [name]                   Admit date: 12/04/2019   DOB: [birth year]             Discharge date:12/07/2019/10:50 AM KVQ:[id number]                          PCP: Mickel Fuchs, MD  Disposition: Home with home health  Recommendations for Outpatient Follow-up:   . Follow up: in 1 week  Discharge Condition: Stable   Code Status:   Code Status: Full Code  Diet recommendation: Cardiac diet   Discharge Diagnoses:    Principal Problem:   Acute on chronic diastolic CHF (congestive heart failure) (HCC) Active Problems:   Atrial fibrillation (HCC)   Hypertension   Stroke (HCC)   Hypothyroidism   Acute respiratory failure with hypoxia (HCC)   History of Present Illness/ Hospital Course [name] Summary:    Brief Narrative:  Alonnie B Hatleyis a 74 y.o.femalewith medical history significant ofhypertension, stroke, hypothyroidism,dCHF, atrial fibrillation on Eliquis, anemia,IBS, whopresents with shortness breath and leg edema. Her chest x-ray showed pulmonary edema. She was placed on IV Lasix for exacerbation of diastolic congestive heart failure. Reviewed most recent echocardiogram performed October 2020, showed normal ejection fraction, moderate pulmonary hypertension, mitral valve prolapse with severe regurgitation,calcified tricuspid valve.  7/6.  Patient developed an episode of short of breath and chest comfort earlier in the morning.  Angina versus symptoms of paroxysmal nocturnal dyspnea.  She still has some crackles in the base, will give another dose IV Lasix.  Obtain cardiology consult per patient request.  Also obtain pulmonology consult previously requested by her cardiologist.  Principal Problem:   Acute on chronic diastolic CHF (congestive heart failure) (HCC) Active Problems:   Atrial fibrillation (HCC)   Hypertension   Stroke (HCC)   Hypothyroidism   Acute respiratory failure with hypoxia  (HCC)    Acute on chronic diastolic congestive heart failure with a moderate pulmonary hypertension and a moderate to severe mitral regurgitation. The patient was seen and examined, stable, continue diuretics, IV Lasix switch to p.o. Shortness of breath has improved --but worsening with minimal exertion. Denies of having any further episodes of chest pain. Appreciate cardiology evaluation  Echocardiogram during this admission revealed preserved LV systolic function with an EF estimated between 55-60%             -Has diuresed 6.3 Liters since admission; would continue Lasix 40mg  po daily               -Currently on supplemental O2; would recommend weaning off and ambulating today with consideration of discharge pending clinical status              -Would recommend f/u with Dr. or Darrold Junker PA-C within 7-10 days of discharge    Atrial fibrillation. -Heart rate much better now -Continue metoprolol, Cardizem -  Continue anticoagulation.  Essential hypertension. Continue home medicines.   Acute hypoxemic respite failure secondary to congestive heart failure. Weaning off supplemental oxygen  Disposition Plan:  Patient came from:Home  Anticipated d/c place:We will discharge home with home health  Nutritional status:          Discharge Instructions:   Discharge Instructions    (HEART FAILURE PATIENTS) Call MD:  Anytime you have any of the following symptoms: 1) 3 pound weight gain in  24 hours or 5 pounds in 1 week 2) shortness of breath, with or without a dry hacking cough 3) swelling in the hands, feet or stomach 4) if you have to sleep on extra pillows at night in order to breathe.   Complete by: As directed    Activity as tolerated - No restrictions   Complete by: As directed    Call MD for:  difficulty breathing, headache or visual disturbances    Complete by: As directed    Call MD for:  temperature >100.4   Complete by: As directed    Diet - low sodium heart healthy   Complete by: As directed    Discharge instructions   Complete by: As directed    Continue with heart failure orders, home health, BNP, BMP q. Weekly Monitor daily weight   Increase activity slowly   Complete by: As directed        Medication List    STOP taking these medications   aspirin 81 MG tablet   IMODIUM PO   meclizine 12.5 MG tablet Commonly known as: ANTIVERT     TAKE these medications   apixaban 5 MG Tabs tablet Commonly known as: ELIQUIS Take 1 tablet (5 mg total) by mouth 2 (two) times daily.   calcium-vitamin D 500-200 MG-UNIT tablet Take 1 tablet by mouth 2 (two) times daily.   diltiazem 240 MG 24 hr capsule Commonly known as: CARDIZEM CD Take 1 capsule (240 mg total) by mouth daily.   folic acid 1 MG tablet Commonly known as: FOLVITE Take 1 mg by mouth daily. Per tube once daily   furosemide 40 MG tablet Commonly known as: LASIX Take 1 tablet (40 mg total) by mouth daily. Start taking on: December 08, 2019 What changed:   medication strength  how much to take   guaiFENesin 600 MG 12 hr tablet Commonly known as: MUCINEX Take 600 mg by mouth 2 (two) times daily.   levothyroxine 75 MCG tablet Commonly known as: SYNTHROID Take 75 mcg by mouth daily before breakfast.   metoprolol succinate 25 MG 24 hr tablet Commonly known as: TOPROL-XL Take 25 mg by mouth daily.   omeprazole 20 MG capsule Commonly known as: PRILOSEC Take 20 mg by mouth daily.   potassium chloride 10 MEQ tablet Commonly known as: KLOR-CON Take 10 mEq by mouth daily.       Follow-up Information    [facility] Follow up on 01/02/2020.   Specialty: Cardiology Why: at 12:00pm. Enter through the Medical Mall entrance Contact information: [address] [place]  [id number] [phone]             Allergies  Allergen Reactions  . Prednisone Hives  . Predicort [Prednisolone Acetate]     HIVES,     Procedures /Studies:   DG Chest Portable 1 View  Result Date: 12/04/2019 CLINICAL DATA:  74 year old female with history of shortness of breath. EXAM: PORTABLE CHEST 1 VIEW COMPARISON:  Chest x-ray 03/17/2019. FINDINGS: There is cephalization of the pulmonary vasculature and slight indistinctness of the interstitial markings suggestive of mild pulmonary edema. Small to moderate right and small left pleural effusions. Bibasilar opacities which may reflect areas of atelectasis and/or consolidation. Mild cardiomegaly. Upper mediastinal contours are within normal limits. Aortic atherosclerosis. IMPRESSION: 1. The appearance the chest suggest congestive heart failure, as above. 2. In addition, there are bibasilar opacities which are favored to reflect areas of subsegmental atelectasis in  the setting of bilateral pleural effusions, although underlying airspace consolidation is not excluded. 3. Aortic atherosclerosis. Electronically Signed   By: Trudie Reed M.D.   On: 12/04/2019 04:30   ECHOCARDIOGRAM COMPLETE  Result Date: 12/06/2019    ECHOCARDIOGRAM REPORT   Patient Name:   JAKYIAH BRIONES Date of Exam: 12/06/2019 Medical Rec #:  [id number]       Height:       65.0 in Accession #:    [id number]      Weight:       148.4 lb Date of Birth:  08-16-1945        BSA:          1.743 m Patient Age:    73 years        BP:           122/84 mmHg Patient Gender: F               HR:           90 bpm. Exam Location:  [facility] Procedure: 2D Echo, Cardiac Doppler and Color Doppler Indications:     I50.31 CHF-Acute Diastolic  History:         Patient has prior history of Echocardiogram examinations. CHF,                  Stroke, Arrythmias:Atrial Fibrillation; Risk                  Factors:Hypertension.  Sonographer:     Humphrey Rolls RDCS (AE) Referring Phys:  [id number] Few NIU Diagnosing  Phys: Harold Hedge MD  Sonographer Comments: Suboptimal subcostal window. IMPRESSIONS  1. Left ventricular ejection fraction, by estimation, is 55 to 60%. The left ventricle has normal function. The left ventricle has no regional wall motion abnormalities. Left ventricular diastolic parameters were normal.  2. Right ventricular systolic function is normal. The right ventricular size is normal. There is mildly elevated pulmonary artery systolic pressure.  3. Left atrial size was moderately dilated.  4. Right atrial size was mild to moderately dilated.  5. The mitral valve is degenerative. Mild mitral valve regurgitation.  6. The aortic valve was not well visualized. Aortic valve regurgitation is trivial. FINDINGS  Left Ventricle: Left ventricular ejection fraction, by estimation, is 55 to 60%. The left ventricle has normal function. The left ventricle has no regional wall motion abnormalities. The left ventricular internal cavity size was normal in size. There is  borderline left ventricular hypertrophy. Left ventricular diastolic parameters were normal. Right Ventricle: The right ventricular size is normal. No increase in right ventricular wall thickness. Right ventricular systolic function is normal. There is mildly elevated pulmonary artery systolic pressure. The tricuspid regurgitant velocity is 2.78  m/s, and with an assumed right atrial pressure of 10 mmHg, the estimated right ventricular systolic pressure is 40.9 mmHg. Left Atrium: Left atrial size was moderately dilated. Right Atrium: Right atrial size was mild to moderately dilated. Pericardium: There is no evidence of pericardial effusion. Mitral Valve: The mitral valve is degenerative in appearance. There is moderate prolapse of the middle scallop of the posterior leaflet of the mitral valve. There is mild thickening of the mitral valve leaflet(s). There is mild calcification of the mitral valve leaflet(s). Mild mitral valve regurgitation. MV peak  gradient, 9.0 mmHg. The mean mitral valve gradient is 5.0 mmHg. Tricuspid Valve: The tricuspid valve is not well visualized. Tricuspid valve regurgitation is mild. Aortic Valve: The aortic valve was not well visualized. Aortic  valve regurgitation is trivial. Aortic valve mean gradient measures 5.0 mmHg. Aortic valve peak gradient measures 9.9 mmHg. Aortic valve area, by VTI measures 1.38 cm. Pulmonic Valve: The pulmonic valve was not well visualized. Pulmonic valve regurgitation is trivial. Aorta: The aortic root is normal in size and structure. IAS/Shunts: The interatrial septum was not assessed.  LEFT VENTRICLE PLAX 2D LVIDd:         4.89 cm  Diastology LVIDs:         2.94 cm  LV e' lateral:   9.25 cm/s LV PW:         1.08 cm  LV E/e' lateral: 14.3 LV IVS:        0.87 cm  LV e' medial:    9.25 cm/s LVOT diam:     2.00 cm  LV E/e' medial:  14.3 LV SV:         32 LV SV Index:   18 LVOT Area:     3.14 cm  RIGHT VENTRICLE RV Basal diam:  3.70 cm LEFT ATRIUM            Index        RIGHT ATRIUM           Index LA diam:      5.80 cm  3.33 cm/m   RA Area:     19.00 cm LA Vol (A2C): 70.4 ml  40.40 ml/m  RA Volume:   47.30 ml  27.14 ml/m LA Vol (A4C): 205.0 ml 117.65 ml/m  AORTIC VALVE                   PULMONIC VALVE AV Area (Vmax):    1.36 cm    PV Vmax:       1.06 m/s AV Area (Vmean):   1.38 cm    PV Vmean:      75.000 cm/s AV Area (VTI):     1.38 cm    PV VTI:        0.166 m AV Vmax:           157.00 cm/s PV Peak grad:  4.5 mmHg AV Vmean:          97.800 cm/s PV Mean grad:  3.0 mmHg AV VTI:            0.230 m AV Peak Grad:      9.9 mmHg AV Mean Grad:      5.0 mmHg LVOT Vmax:         68.00 cm/s LVOT Vmean:        43.100 cm/s LVOT VTI:          0.101 m LVOT/AV VTI ratio: 0.44  AORTA Ao Root diam: 2.60 cm MITRAL VALVE                TRICUSPID VALVE MV Area (PHT): 2.81 cm     TR Peak grad:   30.9 mmHg MV Peak grad:  9.0 mmHg     TR Vmax:        278.00 cm/s MV Mean grad:  5.0 mmHg MV Vmax:       1.50 m/s      SHUNTS MV Vmean:      99.3 cm/s    Systemic VTI:  0.10 m MV Decel Time: 270 msec     Systemic Diam: 2.00 cm MV E velocity: 132.00 cm/s Harold Hedge MD Electronically signed by Harold Hedge MD Signature Date/Time: 12/06/2019/4:37:56 PM    Final  Subjective:   Patient was seen and examined 12/07/2019, 10:50 AM Patient stable today. No acute distress.  No issues overnight Stable for discharge.  Discharge Exam:    Vitals:   12/06/19 1504 12/06/19 1937 12/07/19 0400 12/07/19 0741  BP: 111/69 102/78 122/86 115/80  Pulse: 72 81 91 90  Resp: 16 20 20 18   Temp: 97.9 F (36.6 C) 98.1 F (36.7 C) 98.6 F (37 C) 97.7 F (36.5 C)  TempSrc:  Oral Oral Oral  SpO2: 98% 98% 97% 100%  Weight:   67.4 kg   Height:        General: Pt lying comfortably in bed & appears in no obvious distress. Cardiovascular: S1 & S2 heard, RRR, S1/S2 +. No murmurs, rubs, gallops or clicks. No JVD or pedal edema. Respiratory: Clear to auscultation without wheezing, rhonchi or crackles. No increased work of breathing. Abdominal:  Non-distended, non-tender & soft. No organomegaly or masses appreciated. Normal bowel sounds heard. CNS: Alert and oriented. No focal deficits. Extremities: no edema, no cyanosis    The results of significant diagnostics from this hospitalization (including imaging, microbiology, ancillary and laboratory) are listed below for reference.      Microbiology:   Recent Results (from the past 240 hour(s))  SARS Coronavirus 2 by RT PCR (hospital order, performed in [facility] hospital lab) Nasopharyngeal Nasopharyngeal Swab     Status: None   Collection Time: 12/04/19  5:10 AM   Specimen: Nasopharyngeal Swab  Result Value Ref Range Status   SARS Coronavirus 2 NEGATIVE NEGATIVE Final    Comment: (NOTE) SARS-CoV-2 target nucleic acids are NOT DETECTED.  The SARS-CoV-2 RNA is generally detectable in upper and lower respiratory specimens during the acute phase of infection. The  lowest concentration of SARS-CoV-2 viral copies this assay can detect is 250 copies / mL. A negative result does not preclude SARS-CoV-2 infection and should not be used as the sole basis for treatment or other patient management decisions.  A negative result may occur with improper specimen collection / handling, submission of specimen other than nasopharyngeal swab, presence of viral mutation(s) within the areas targeted by this assay, and inadequate number of viral copies (<250 copies / mL). A negative result must be combined with clinical observations, patient history, and epidemiological information.  Fact Sheet for Patients:   02/04/20  Fact Sheet for Healthcare Providers: BoilerBrush.com.cy  This test is not yet approved or  cleared by the https://pope.com/ FDA and has been authorized for detection and/or diagnosis of SARS-CoV-2 by FDA under an Emergency Use Authorization (EUA).  This EUA will remain in effect (meaning this test can be used) for the duration of the COVID-19 declaration under Section 564(b)(1) of the Act, 21 U.S.C. section 360bbb-3(b)(1), unless the authorization is terminated or revoked sooner.  Performed at [facility], [address] [place], [place]      Labs:   CBC: Recent Labs  Lab 12/04/19 0401 12/05/19 0509  WBC 9.5 8.5  NEUTROABS 7.2  --   HGB 10.2* 10.1*  HCT 33.2* 33.1*  MCV 71.4* 70.9*  PLT 266 254   Basic Metabolic Panel: Recent Labs  Lab 12/04/19 0401 12/05/19 0509 12/06/19 0537 12/07/19 0431  NA 137 139 133* 137  K 3.7 3.7 3.9 3.5  CL 108 107 99 100  CO2 21* 25 24 27   GLUCOSE 136* 94 108* 100*  BUN 14 12 16 18   CREATININE 0.86 0.81 0.98 0.91  CALCIUM 8.2* 8.4* 8.6* 8.7*  MG  --  2.0  --  2.1   Liver Function Tests: Recent Labs  Lab 12/04/19 0401  AST 45*  ALT 33  ALKPHOS 93  BILITOT 0.9  PROT 6.6  ALBUMIN 3.2*   BNP (last 3  results) Recent Labs    03/17/19 1230 12/04/19 0401 12/07/19 0751  BNP 1,070.0* 974.8* 686.2*   Cardiac Enzymes: No results for input(s): CKTOTAL, CKMB, CKMBINDEX, TROPONINI in the last 168 hours. CBG: No results for input(s): GLUCAP in the last 168 hours. Hgb A1c No results for input(s): HGBA1C in the last 72 hours. Lipid Profile No results for input(s): CHOL, HDL, LDLCALC, TRIG, CHOLHDL, LDLDIRECT in the last 72 hours. Thyroid function studies No results for input(s): TSH, T4TOTAL, T3FREE, THYROIDAB in the last 72 hours.  Invalid input(s): FREET3 Anemia work up No results for input(s): VITAMINB12, FOLATE, FERRITIN, TIBC, IRON, RETICCTPCT in the last 72 hours. Urinalysis    Component Value Date/Time   COLORURINE STRAW (A) 01/13/2018 1530   APPEARANCEUR CLEAR (A) 01/13/2018 1530   APPEARANCEUR Clear 08/29/2011 0037   LABSPEC 1.002 (L) 01/13/2018 1530   LABSPEC 1.009 08/29/2011 0037   PHURINE 6.0 01/13/2018 1530   GLUCOSEU NEGATIVE 01/13/2018 1530   GLUCOSEU Negative 08/29/2011 0037   HGBUR SMALL (A) 01/13/2018 1530   BILIRUBINUR NEGATIVE 01/13/2018 1530   BILIRUBINUR Negative 08/29/2011 0037   KETONESUR NEGATIVE 01/13/2018 1530   PROTEINUR NEGATIVE 01/13/2018 1530   UROBILINOGEN 0.2 01/17/2011 0037   NITRITE NEGATIVE 01/13/2018 1530   LEUKOCYTESUR NEGATIVE 01/13/2018 1530   LEUKOCYTESUR 1+ 08/29/2011 0037         Time coordinating discharge: Over 45 minutes  SIGNED: Kendell Bane, MD, FACP, FHM. [facility],  Please use [email or web address] to Page If 7PM-7AM, please contact night-coverage [email or web address], Password [facility] 12/07/2019, 10:50 AM

## 2019-12-07 NOTE — Progress Notes (Signed)
Physical Therapy Treatment Patient Details Name: [name] MRN: [id number] DOB: 1945/07/03 Today's Date: 12/07/2019    History of Present Illness Pt is a 74 y.o. female with medical history significant of hypertension, stroke, hypothyroidism, dCHF, atrial fibrillation on Eliquis, anemia, IBS, who presents with shortness breath and leg edema. Chest xray showed pulmonary edema, admitted for acute on chronic diastolic CHF.    PT Comments    Pt was long sitting in bed upon arriving. She agrees to PT session and is cooperative and pleasant throughout. She is excited to be DCing home later this date. Pt is A and O x 4. Was able to exit L side of bed without assistance and stand and ambulate with use of SPC. Pt is unwilling to use RW at this time but demonstrated safe abilities with use of SPC. Pt was on 1 L o2 with sao2 98%. Discontinued O2 throughout session with sao2 > 96% throughout. RN notified. Pt overall is progressing well with acute PT. She will benefit from continued skilled HHPT at DC to address, strength, endurance, balance, and overall safe functional mobility deficits. Pt was seated in recliner with call bell in reach, chair alarm in place, and BLEs elevated.     Follow Up Recommendations  Home health PT     Equipment Recommendations  Other (comment) (pt refusing use of RW at this time. Has persona cane at home)    Recommendations for Other Services       Precautions / Restrictions Precautions Precautions: None Restrictions Weight Bearing Restrictions: No    Mobility  Bed Mobility Overal bed mobility: Modified Independent             General bed mobility comments: pt demonstrated safe ability to exit L sid eof bed without physical assistance/cueing  Transfers Overall transfer level: Modified independent Equipment used: Rolling walker (2 wheeled) Transfers: Sit to/from Stand Sit to Stand: Modified independent (Device/Increase time)             Ambulation/Gait Ambulation/Gait assistance: Supervision Gait Distance (Feet): 200 Feet Assistive device: Straight cane Gait Pattern/deviations: WFL(Within Functional Limits) Gait velocity: WNL   General Gait Details: Pt was able to safely ambulate with SPC 200 ft without O2. SAo2 > 96% and HR WFL.   Stairs             Wheelchair Mobility    Modified Rankin (Stroke Patients Only)       Balance Overall balance assessment: Needs assistance Sitting-balance support: Feet supported;No upper extremity supported Sitting balance-Leahy Scale: Good Sitting balance - Comments: no LOB in sitting   Standing balance support: Single extremity supported;During functional activity Standing balance-Leahy Scale: Good Standing balance comment: no LOB in static standing, slight unsteadiness with use of SPC during ambulation however pt unwilling to use RW at home at this time                            Cognition Arousal/Alertness: Awake/alert Behavior During Therapy: [facility] for tasks assessed/performed Overall Cognitive Status: Within Functional Limits for tasks assessed                                 General Comments: A and O x 4 and cooperative and pleasant.      Exercises      General Comments        Pertinent Vitals/Pain Pain Assessment: No/denies pain  Home Living                      Prior Function            PT Goals (current goals can now be found in the care plan section) Acute Rehab PT Goals Patient Stated Goal: To not be out of breath while talking Progress towards PT goals: Progressing toward goals    Frequency    Min 2X/week      PT Plan Current plan remains appropriate    Co-evaluation              AM-PAC PT "6 Clicks" Mobility   Outcome Measure  Help needed turning from your back to your side while in a flat bed without using bedrails?: None Help needed moving from lying on your back to sitting on  the side of a flat bed without using bedrails?: None Help needed moving to and from a bed to a chair (including a wheelchair)?: A Little Help needed standing up from a chair using your arms (e.g., wheelchair or bedside chair)?: A Little Help needed to walk in hospital room?: A Little Help needed climbing 3-5 steps with a railing? : A Little 6 Click Score: 20    End of Session Equipment Utilized During Treatment: Gait belt Activity Tolerance: Patient tolerated treatment well Patient left: in chair;with call bell/phone within reach;with chair alarm set Nurse Communication: Mobility status PT Visit Diagnosis: Muscle weakness (generalized) (M62.81);Other abnormalities of gait and mobility (R26.89);Difficulty in walking, not elsewhere classified (R26.2)     Time: 1607-3710 PT Time Calculation (min) (ACUTE ONLY): 27 min  Charges:  $Gait Training: 8-22 mins $Therapeutic Activity: 8-22 mins                     Jetta Lout PTA 12/07/19, 11:56 AM

## 2019-12-07 NOTE — Progress Notes (Signed)
[facility] Cardiology    SUBJECTIVE: [name] is a 74 year old female with a past medical history significant for paroxsymal atrial fibrillation, anticoagulated with Eliquis, HFpEF, mitral valve prolapse with moderate to severe MR, history of a CVA, pulmonary hypertension, and essential hypertension who presented to the ED on 12/04/19 with a few day history of progressive exertional dyspnea, lower extremity swelling, and orthopnea.  Workup was significant for chest xray revealing mild pulmonary edema, small to moderate right pleural effusion and a small left pleural effusion, ECG revealing atrial fibrillation at a ventricular rate of 100bpm, BNP of 974, and high sensitivity troponin negative x 1.    She is followed in outpatient cardiology by Dr. Darrold Junker. Echocardiogram during this admission revealed normal LV systolic function with mild MR, mild TR. Previous echocardiogram on 03/18/19 revealed normal LV systolic function with an EF estimated between 60-65% with moderate mitral valve prolapse with moderate to severe MR, moderate calcification of the aortic valve, and moderately elevated pulmonary pressures.   Today, [name] reports that she is doing well and denies any recurrent chest tightness.  She remains on supplemental O2 via [place], but denies shortness of breath, orthopnea, PND, or lower extremity swelling.    Vitals:   12/06/19 1100 12/06/19 1504 12/06/19 1937 12/07/19 0400  BP: 113/71 111/69 102/78 122/86  Pulse: 88 72 81 91  Resp: 18 16 20 20   Temp: 97.7 F (36.5 C) 97.9 F (36.6 C) 98.1 F (36.7 C) 98.6 F (37 C)  TempSrc: Oral  Oral Oral  SpO2: 100% 98% 98% 97%  Weight:    67.4 kg  Height:         Intake/Output Summary (Last 24 hours) at 12/07/2019 0741 Last data filed at 12/06/2019 1841 Gross per 24 hour  Intake 360 ml  Output 3300 ml  Net -2940 ml      PHYSICAL EXAM  General: Well developed, well nourished, in no acute distress HEENT:  Normocephalic and atramatic Neck:   No JVD.  Lungs: Clear bilaterally to auscultation and percussion. Heart: Irregularly irregular, rate controlled . Normal S1 and S2 without gallops or murmurs.  Abdomen: Bowel sounds are positive, abdomen soft and non-tender  Msk:  Back normal, normal gait. Normal strength and tone for age. Extremities: No clubbing, cyanosis or edema.   Neuro: Alert and oriented X 3. Psych:  Good affect, responds appropriately   LABS: Basic Metabolic Panel: Recent Labs    12/05/19 0509 12/05/19 0509 12/06/19 0537 12/07/19 0431  NA 139   < > 133* 137  K 3.7   < > 3.9 3.5  CL 107   < > 99 100  CO2 25   < > 24 27  GLUCOSE 94   < > 108* 100*  BUN 12   < > 16 18  CREATININE 0.81   < > 0.98 0.91  CALCIUM 8.4*   < > 8.6* 8.7*  MG 2.0  --   --  2.1   < > = values in this interval not displayed.   Liver Function Tests: No results for input(s): AST, ALT, ALKPHOS, BILITOT, PROT, ALBUMIN in the last 72 hours. No results for input(s): LIPASE, AMYLASE in the last 72 hours. CBC: Recent Labs    12/05/19 0509  WBC 8.5  HGB 10.1*  HCT 33.1*  MCV 70.9*  PLT 254   Cardiac Enzymes: No results for input(s): CKTOTAL, CKMB, CKMBINDEX, TROPONINI in the last 72 hours. BNP: Invalid input(s): POCBNP D-Dimer: No results for input(s):  DDIMER in the last 72 hours. Hemoglobin A1C: No results for input(s): HGBA1C in the last 72 hours. Fasting Lipid Panel: No results for input(s): CHOL, HDL, LDLCALC, TRIG, CHOLHDL, LDLDIRECT in the last 72 hours. Thyroid Function Tests: No results for input(s): TSH, T4TOTAL, T3FREE, THYROIDAB in the last 72 hours.  Invalid input(s): FREET3 Anemia Panel: No results for input(s): VITAMINB12, FOLATE, FERRITIN, TIBC, IRON, RETICCTPCT in the last 72 hours.  ECHOCARDIOGRAM COMPLETE  Result Date: 12/06/2019    ECHOCARDIOGRAM REPORT   Patient Name:   [name] Date of Exam: 12/06/2019 Medical Rec #:  [id number]       Height:       65.0 in Accession #:    [id number]      Weight:        148.4 lb Date of Birth:  1945-12-04        BSA:          1.743 m Patient Age:    73 years        BP:           122/84 mmHg Patient Gender: F               HR:           90 bpm. Exam Location:  [facility] Procedure: 2D Echo, Cardiac Doppler and Color Doppler Indications:     I50.31 CHF-Acute Diastolic  History:         Patient has prior history of Echocardiogram examinations. CHF,                  Stroke, Arrythmias:Atrial Fibrillation; Risk                  Factors:Hypertension.  Sonographer:     Humphrey Rolls RDCS (AE) Referring Phys:  [id number] Brien Few NIU Diagnosing Phys: Harold Hedge MD  Sonographer Comments: Suboptimal subcostal window. IMPRESSIONS  1. Left ventricular ejection fraction, by estimation, is 55 to 60%. The left ventricle has normal function. The left ventricle has no regional wall motion abnormalities. Left ventricular diastolic parameters were normal.  2. Right ventricular systolic function is normal. The right ventricular size is normal. There is mildly elevated pulmonary artery systolic pressure.  3. Left atrial size was moderately dilated.  4. Right atrial size was mild to moderately dilated.  5. The mitral valve is degenerative. Mild mitral valve regurgitation.  6. The aortic valve was not well visualized. Aortic valve regurgitation is trivial. FINDINGS  Left Ventricle: Left ventricular ejection fraction, by estimation, is 55 to 60%. The left ventricle has normal function. The left ventricle has no regional wall motion abnormalities. The left ventricular internal cavity size was normal in size. There is  borderline left ventricular hypertrophy. Left ventricular diastolic parameters were normal. Right Ventricle: The right ventricular size is normal. No increase in right ventricular wall thickness. Right ventricular systolic function is normal. There is mildly elevated pulmonary artery systolic pressure. The tricuspid regurgitant velocity is 2.78  m/s, and with an assumed right atrial pressure of 10 mmHg,  the estimated right ventricular systolic pressure is 40.9 mmHg. Left Atrium: Left atrial size was moderately dilated. Right Atrium: Right atrial size was mild to moderately dilated. Pericardium: There is no evidence of pericardial effusion. Mitral Valve: The mitral valve is degenerative in appearance. There is moderate prolapse of the middle scallop of the posterior leaflet of the mitral valve. There is mild thickening of the mitral valve leaflet(s). There is mild calcification of the mitral valve  leaflet(s). Mild mitral valve regurgitation. MV peak gradient, 9.0 mmHg. The mean mitral valve gradient is 5.0 mmHg. Tricuspid Valve: The tricuspid valve is not well visualized. Tricuspid valve regurgitation is mild. Aortic Valve: The aortic valve was not well visualized. Aortic valve regurgitation is trivial. Aortic valve mean gradient measures 5.0 mmHg. Aortic valve peak gradient measures 9.9 mmHg. Aortic valve area, by VTI measures 1.38 cm. Pulmonic Valve: The pulmonic valve was not well visualized. Pulmonic valve regurgitation is trivial. Aorta: The aortic root is normal in size and structure. IAS/Shunts: The interatrial septum was not assessed.  LEFT VENTRICLE PLAX 2D LVIDd:         4.89 cm  Diastology LVIDs:         2.94 cm  LV e' lateral:   9.25 cm/s LV PW:         1.08 cm  LV E/e' lateral: 14.3 LV IVS:        0.87 cm  LV e' medial:    9.25 cm/s LVOT diam:     2.00 cm  LV E/e' medial:  14.3 LV SV:         32 LV SV Index:   18 LVOT Area:     3.14 cm  RIGHT VENTRICLE RV Basal diam:  3.70 cm LEFT ATRIUM            Index        RIGHT ATRIUM           Index LA diam:      5.80 cm  3.33 cm/m   RA Area:     19.00 cm LA Vol (A2C): 70.4 ml  40.40 ml/m  RA Volume:   47.30 ml  27.14 ml/m LA Vol (A4C): 205.0 ml 117.65 ml/m  AORTIC VALVE                   PULMONIC VALVE AV Area (Vmax):    1.36 cm    PV Vmax:       1.06 m/s AV Area (Vmean):   1.38 cm    PV Vmean:      75.000 cm/s AV Area (VTI):     1.38 cm    PV VTI:         0.166 m AV Vmax:           157.00 cm/s PV Peak grad:  4.5 mmHg AV Vmean:          97.800 cm/s PV Mean grad:  3.0 mmHg AV VTI:            0.230 m AV Peak Grad:      9.9 mmHg AV Mean Grad:      5.0 mmHg LVOT Vmax:         68.00 cm/s LVOT Vmean:        43.100 cm/s LVOT VTI:          0.101 m LVOT/AV VTI ratio: 0.44  AORTA Ao Root diam: 2.60 cm MITRAL VALVE                TRICUSPID VALVE MV Area (PHT): 2.81 cm     TR Peak grad:   30.9 mmHg MV Peak grad:  9.0 mmHg     TR Vmax:        278.00 cm/s MV Mean grad:  5.0 mmHg MV Vmax:       1.50 m/s     SHUNTS MV Vmean:      99.3 cm/s    Systemic  VTI:  0.10 m MV Decel Time: 270 msec     Systemic Diam: 2.00 cm MV E velocity: 132.00 cm/s Harold Hedge MD Electronically signed by Harold Hedge MD Signature Date/Time: 12/06/2019/4:37:56 PM    Final      Echo:  Normal RV and LV systolic function with an EF estimated between 55-60% with mild MR and mild TR. Left atrium was moderately dilated, right atrium was mild to moderately dilated.   TELEMETRY: Atrial fibrillation, controlled ventricular response with rate in the low 90s   ASSESSMENT AND PLAN:  Principal Problem:   Acute on chronic diastolic CHF (congestive heart failure) (HCC) Active Problems:   Atrial fibrillation (HCC)   Hypertension   Stroke (HCC)   Hypothyroidism   Acute respiratory failure with hypoxia (HCC)    1.  Atrial fibrillation              -Rate remains well controlled; continue diltiazem 240mg  daily and metoprolol 25mg  once daily              -Continue Eliquis 5mg  BID   2.  Acute on chronic HFpEF  -Echocardiogram during this admission revealed preserved LV systolic function with an EF estimated between 55-60%  -Has diuresed 6.3 Liters since admission; would continue Lasix 40mg  po daily    -Currently on supplemental O2; would recommend weaning off and ambulating today with consideration of discharge pending clinical status   -Would recommend f/u with Dr. or PA-C  within 7-10 days of discharge   3.  Acute respiratory distress  -Likely due to acute on chronic CHF  -Evaluated by pulmonology; consideration for RHC in outpatient setting if warranted   4.  Chest pain   -No further episodes since yesterday morning; no further invasive cardiac workup indicated at this time   The history, physical exam findings, and plan of care were all discussed with Dr. , and all decision making was made in collaboration.   Darrold Junker  PA-C 12/07/2019 7:41 AM

## 2019-12-13 ENCOUNTER — Telehealth: Payer: Self-pay | Admitting: Family

## 2019-12-13 NOTE — Telephone Encounter (Signed)
Spoke to patient who said she is doing well since discharged from hospital. She is using a spirometer from hospital that helps a lot with her SOB and she is waiting on someone from hospital to call to schedule her with PT and a CT for her stomach. She is checking her weight daily and following a low sodium diet. She confirmed her follow up appointment with [place] for 8/2 in the [facility].   [name], NT

## 2019-12-14 ENCOUNTER — Other Ambulatory Visit: Payer: Self-pay | Admitting: Student

## 2019-12-14 DIAGNOSIS — R1032 Left lower quadrant pain: Secondary | ICD-10-CM

## 2019-12-14 DIAGNOSIS — R195 Other fecal abnormalities: Secondary | ICD-10-CM

## 2019-12-20 ENCOUNTER — Inpatient Hospital Stay: Payer: Medicare Other | Attending: Oncology | Admitting: Oncology

## 2019-12-20 ENCOUNTER — Other Ambulatory Visit: Payer: Self-pay

## 2019-12-20 ENCOUNTER — Inpatient Hospital Stay: Payer: Medicare Other

## 2019-12-20 VITALS — BP 118/79 | HR 87 | Temp 98.7°F | Wt 148.6 lb

## 2019-12-20 DIAGNOSIS — D509 Iron deficiency anemia, unspecified: Secondary | ICD-10-CM | POA: Diagnosis not present

## 2019-12-20 DIAGNOSIS — Z8673 Personal history of transient ischemic attack (TIA), and cerebral infarction without residual deficits: Secondary | ICD-10-CM | POA: Insufficient documentation

## 2019-12-20 DIAGNOSIS — Z7901 Long term (current) use of anticoagulants: Secondary | ICD-10-CM | POA: Diagnosis not present

## 2019-12-20 DIAGNOSIS — R109 Unspecified abdominal pain: Secondary | ICD-10-CM | POA: Diagnosis not present

## 2019-12-20 DIAGNOSIS — Z803 Family history of malignant neoplasm of breast: Secondary | ICD-10-CM | POA: Insufficient documentation

## 2019-12-20 DIAGNOSIS — I4891 Unspecified atrial fibrillation: Secondary | ICD-10-CM

## 2019-12-20 NOTE — Progress Notes (Signed)
Hematology/Oncology Consult note [facility] [phone] [phone]  Patient Care Team: Mickel Fuchs, MD as PCP - General (Family Medicine)   Name of the patient: [name]  [id number]  1945-08-29    Reason for referral-iron deficiency anemia   Referring physician-Dr. Pilar Grammes  Date of visit: 12/20/19   History of presenting illness- Patient is a 74 year old female with a past medical history significant for paroxysmal atrial fibrillation on Eliquis, hypertension, history of CVA pulmonary hypertension. She was recently admitted to the hospital for shortness of breath and chest discomfort and was treated with diuresis.She has undergone EGD back in 2018 which showed a hiatal hernia and multiple gastric polyps.  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  Ferritin levels were low at eight.  Iron saturation low at 5%.  CMP was normal.  Patient was recently seen by [facility] clinic GI for symptoms of abdominal pain and has a CT scan scheduled for tomorrow.  ECOG PS- 2  Pain scale- 0   Review of systems- Review of Systems  Constitutional: Positive for malaise/fatigue. Negative for chills, fever and weight loss.  HENT: Negative for congestion, ear discharge and nosebleeds.   Eyes: Negative for blurred vision.  Respiratory: Negative for cough, hemoptysis, sputum production, shortness of breath and wheezing.   Cardiovascular: Negative for chest pain, palpitations, orthopnea and claudication.  Gastrointestinal: Negative for abdominal pain, blood in stool, constipation, diarrhea, heartburn, melena, nausea and vomiting.  Genitourinary: Negative for dysuria, flank pain, frequency, hematuria and urgency.  Musculoskeletal: Negative for back pain, joint pain and myalgias.  Skin: Negative for rash.  Neurological:  Negative for dizziness, tingling, focal weakness, seizures, weakness and headaches.  Endo/Heme/Allergies: Does not bruise/bleed easily.  Psychiatric/Behavioral: Negative for depression and suicidal ideas. The patient does not have insomnia.     Allergies  Allergen Reactions  . Prednisone Hives  . Predicort [Prednisolone Acetate]     HIVES,    Patient Active Problem List   Diagnosis Date Noted  . Acute on chronic diastolic CHF (congestive heart failure) (HCC) 12/04/2019  . Atrial fibrillation (HCC)   . Hypertension   . Stroke (HCC)   . Hypothyroidism   . Acute respiratory failure with hypoxia (HCC)   . Acute congestive heart failure (HCC) 03/17/2019  . Acute gastroenteritis 07/05/2017  . Acute respiratory failure (HCC) 03/27/2011     Past Medical History:  Diagnosis Date  . Acute respiratory failure (HCC)    Secondary to aspiration pneumonia   . Altered mental status    Secondary to viral herpes simplex virus encephalitis  . Anemia   . Atrial fibrillation (HCC)   . Bilateral pneumonia   . CHF (congestive heart failure) (HCC)   . Dysphasia   . Encephalitis   . Hyperglycemia   . Hypertension   . Hyponatremia   . Seizures (HCC)   . Stroke [facility])      Past Surgical History:  Procedure Laterality Date  . CHOLECYSTECTOMY    . COLONOSCOPY WITH PROPOFOL N/A 12/31/2016   Procedure: COLONOSCOPY WITH PROPOFOL;  Surgeon: Scot Jun, MD;  Location: [facility] ENDOSCOPY;  Service: Endoscopy;  Laterality: N/A;  . ESOPHAGOGASTRODUODENOSCOPY (EGD) WITH PROPOFOL N/A 12/31/2016   Procedure: ESOPHAGOGASTRODUODENOSCOPY (EGD) WITH PROPOFOL;  Surgeon: Scot Jun, MD;  Location: [facility] ENDOSCOPY;  Service: Endoscopy;  Laterality: N/A;  . TONSILLECTOMY    .  TUBAL LIGATION      Social History   Socioeconomic History  . Marital status: Widowed    Spouse name: Not on file  . Number of children: 2  . Years of education: Not on file  . Highest education level: Not on file    Occupational History  . Occupation: medical transcript  Tobacco Use  . Smoking status: Never Smoker  . Smokeless tobacco: Never Used  Vaping Use  . Vaping Use: Never used  Substance and Sexual Activity  . Alcohol use: No  . Drug use: No  . Sexual activity: Never  Other Topics Concern  . Not on file  Social History Narrative  . Not on file   Social Determinants of Health   Financial Resource Strain: Low Risk   . Difficulty of Paying Living Expenses: Not hard at all  Food Insecurity: No Food Insecurity  . Worried About Programme researcher, broadcasting/film/video in the Last Year: Never true  . Ran Out of Food in the Last Year: Never true  Transportation Needs: No Transportation Needs  . Lack of Transportation (Medical): No  . Lack of Transportation (Non-Medical): No  Physical Activity: Insufficiently Active  . Days of Exercise per Week: 2 days  . Minutes of Exercise per Session: 30 min  Stress: No Stress Concern Present  . Feeling of Stress : Not at all  Social Connections: Moderately Isolated  . Frequency of Communication with Friends and Family: More than three times a week  . Frequency of Social Gatherings with Friends and Family: More than three times a week  . Attends Religious Services: 1 to 4 times per year  . Active Member of Clubs or Organizations: No  . Attends Banker Meetings: Never  . Marital Status: Widowed  Intimate Partner Violence: Not At Risk  . Fear of Current or Ex-Partner: No  . Emotionally Abused: No  . Physically Abused: No  . Sexually Abused: No     Family History  Problem Relation Age of Onset  . Prostate cancer Father   . Breast cancer Maternal Aunt   . Breast cancer Maternal Aunt   . Cerebral palsy Son      Current Outpatient Medications:  .  apixaban (ELIQUIS) 5 MG TABS tablet, Take 1 tablet (5 mg total) by mouth 2 (two) times daily., Disp: 60 tablet, Rfl: 0 .  Calcium Carb-Cholecalciferol (CALCIUM-VITAMIN D) 500-200 MG-UNIT tablet, Take 1  tablet by mouth 2 (two) times daily., Disp: , Rfl:  .  diltiazem (CARDIZEM CD) 240 MG 24 hr capsule, Take 1 capsule (240 mg total) by mouth daily., Disp: 30 capsule, Rfl: 0 .  folic acid (FOLVITE) 1 MG tablet, Take 1 mg by mouth daily. Per tube once daily  , Disp: , Rfl:  .  furosemide (LASIX) 40 MG tablet, Take 1 tablet (40 mg total) by mouth daily., Disp: 30 tablet, Rfl: 1 .  guaiFENesin (MUCINEX) 600 MG 12 hr tablet, Take 600 mg by mouth 2 (two) times daily., Disp: , Rfl:  .  levothyroxine (SYNTHROID) 75 MCG tablet, Take 75 mcg by mouth daily before breakfast. , Disp: , Rfl:  .  metoprolol succinate (TOPROL-XL) 25 MG 24 hr tablet, Take 25 mg by mouth daily., Disp: , Rfl:  .  omeprazole (PRILOSEC) 20 MG capsule, Take 20 mg by mouth daily., Disp: , Rfl:  .  potassium chloride (KLOR-CON) 10 MEQ tablet, Take 10 mEq by mouth daily., Disp: , Rfl:    Physical exam:  Vitals:  12/20/19 1027  BP: 118/79  Pulse: 87  Temp: 98.7 F (37.1 C)  SpO2: 99%  Weight: 148 lb 9.6 oz (67.4 kg)   Physical Exam Constitutional:      Comments: She ambulates with a cane.  Appears in no acute distress  Cardiovascular:     Rate and Rhythm: Normal rate and regular rhythm.     Heart sounds: Murmur (Systolic murmur positive) heard.   Pulmonary:     Effort: Pulmonary effort is normal.     Breath sounds: Normal breath sounds.  Abdominal:     General: Bowel sounds are normal.     Palpations: Abdomen is soft.  Skin:    General: Skin is warm and dry.  Neurological:     Mental Status: She is alert and oriented to person, place, and time.        CMP Latest Ref Rng & Units 12/07/2019  Glucose 70 - 99 mg/dL 921(J)  BUN 8 - 23 mg/dL 18  Creatinine 9.41 - 7.40 mg/dL 8.14  Sodium 481 - 856 mmol/L 137  Potassium 3.5 - 5.1 mmol/L 3.5  Chloride 98 - 111 mmol/L 100  CO2 22 - 32 mmol/L 27  Calcium 8.9 - 10.3 mg/dL 3.1(S)  Total Protein 6.5 - 8.1 g/dL -  Total Bilirubin 0.3 - 1.2 mg/dL -  Alkaline Phos 38 - 970  U/L -  AST 15 - 41 U/L -  ALT 0 - 44 U/L -   CBC Latest Ref Rng & Units 12/07/2019  WBC 4.0 - 10.5 K/uL 9.3  Hemoglobin 12.0 - 15.0 g/dL 10.8(L)  Hematocrit 36 - 46 % 35.2(L)  Platelets 150 - 400 K/uL 274    No images are attached to the encounter.  DG Chest Portable 1 View  Result Date: 12/04/2019 CLINICAL DATA:  74 year old female with history of shortness of breath. EXAM: PORTABLE CHEST 1 VIEW COMPARISON:  Chest x-ray 03/17/2019. FINDINGS: There is cephalization of the pulmonary vasculature and slight indistinctness of the interstitial markings suggestive of mild pulmonary edema. Small to moderate right and small left pleural effusions. Bibasilar opacities which may reflect areas of atelectasis and/or consolidation. Mild cardiomegaly. Upper mediastinal contours are within normal limits. Aortic atherosclerosis. IMPRESSION: 1. The appearance the chest suggest congestive heart failure, as above. 2. In addition, there are bibasilar opacities which are favored to reflect areas of subsegmental atelectasis in the setting of bilateral pleural effusions, although underlying airspace consolidation is not excluded. 3. Aortic atherosclerosis. Electronically Signed   By: Trudie Reed M.D.   On: 12/04/2019 04:30   ECHOCARDIOGRAM COMPLETE  Result Date: 12/06/2019    ECHOCARDIOGRAM REPORT   Patient Name:   MINHA FULCO Date of Exam: 12/06/2019 Medical Rec #:  [id number]       Height:       65.0 in Accession #:    [id number]      Weight:       148.4 lb Date of Birth:  02-Jun-1946        BSA:          1.743 m Patient Age:    73 years        BP:           122/84 mmHg Patient Gender: F               HR:           90 bpm. Exam Location:  [facility] Procedure: 2D Echo, Cardiac Doppler and Color Doppler Indications:  I50.31 CHF-Acute Diastolic  History:         Patient has prior history of Echocardiogram examinations. CHF,                  Stroke, Arrythmias:Atrial Fibrillation; Risk                  Factors:Hypertension.   Sonographer:     Humphrey Rolls RDCS (AE) Referring Phys:  [id number] Few NIU Diagnosing Phys: Harold Hedge MD  Sonographer Comments: Suboptimal subcostal window. IMPRESSIONS  1. Left ventricular ejection fraction, by estimation, is 55 to 60%. The left ventricle has normal function. The left ventricle has no regional wall motion abnormalities. Left ventricular diastolic parameters were normal.  2. Right ventricular systolic function is normal. The right ventricular size is normal. There is mildly elevated pulmonary artery systolic pressure.  3. Left atrial size was moderately dilated.  4. Right atrial size was mild to moderately dilated.  5. The mitral valve is degenerative. Mild mitral valve regurgitation.  6. The aortic valve was not well visualized. Aortic valve regurgitation is trivial. FINDINGS  Left Ventricle: Left ventricular ejection fraction, by estimation, is 55 to 60%. The left ventricle has normal function. The left ventricle has no regional wall motion abnormalities. The left ventricular internal cavity size was normal in size. There is  borderline left ventricular hypertrophy. Left ventricular diastolic parameters were normal. Right Ventricle: The right ventricular size is normal. No increase in right ventricular wall thickness. Right ventricular systolic function is normal. There is mildly elevated pulmonary artery systolic pressure. The tricuspid regurgitant velocity is 2.78  m/s, and with an assumed right atrial pressure of 10 mmHg, the estimated right ventricular systolic pressure is 40.9 mmHg. Left Atrium: Left atrial size was moderately dilated. Right Atrium: Right atrial size was mild to moderately dilated. Pericardium: There is no evidence of pericardial effusion. Mitral Valve: The mitral valve is degenerative in appearance. There is moderate prolapse of the middle scallop of the posterior leaflet of the mitral valve. There is mild thickening of the mitral valve leaflet(s). There is mild calcification  of the mitral valve leaflet(s). Mild mitral valve regurgitation. MV peak gradient, 9.0 mmHg. The mean mitral valve gradient is 5.0 mmHg. Tricuspid Valve: The tricuspid valve is not well visualized. Tricuspid valve regurgitation is mild. Aortic Valve: The aortic valve was not well visualized. Aortic valve regurgitation is trivial. Aortic valve mean gradient measures 5.0 mmHg. Aortic valve peak gradient measures 9.9 mmHg. Aortic valve area, by VTI measures 1.38 cm. Pulmonic Valve: The pulmonic valve was not well visualized. Pulmonic valve regurgitation is trivial. Aorta: The aortic root is normal in size and structure. IAS/Shunts: The interatrial septum was not assessed.  LEFT VENTRICLE PLAX 2D LVIDd:         4.89 cm  Diastology LVIDs:         2.94 cm  LV e' lateral:   9.25 cm/s LV PW:         1.08 cm  LV E/e' lateral: 14.3 LV IVS:        0.87 cm  LV e' medial:    9.25 cm/s LVOT diam:     2.00 cm  LV E/e' medial:  14.3 LV SV:         32 LV SV Index:   18 LVOT Area:     3.14 cm  RIGHT VENTRICLE RV Basal diam:  3.70 cm LEFT ATRIUM            Index  RIGHT ATRIUM           Index LA diam:      5.80 cm  3.33 cm/m   RA Area:     19.00 cm LA Vol (A2C): 70.4 ml  40.40 ml/m  RA Volume:   47.30 ml  27.14 ml/m LA Vol (A4C): 205.0 ml 117.65 ml/m  AORTIC VALVE                   PULMONIC VALVE AV Area (Vmax):    1.36 cm    PV Vmax:       1.06 m/s AV Area (Vmean):   1.38 cm    PV Vmean:      75.000 cm/s AV Area (VTI):     1.38 cm    PV VTI:        0.166 m AV Vmax:           157.00 cm/s PV Peak grad:  4.5 mmHg AV Vmean:          97.800 cm/s PV Mean grad:  3.0 mmHg AV VTI:            0.230 m AV Peak Grad:      9.9 mmHg AV Mean Grad:      5.0 mmHg LVOT Vmax:         68.00 cm/s LVOT Vmean:        43.100 cm/s LVOT VTI:          0.101 m LVOT/AV VTI ratio: 0.44  AORTA Ao Root diam: 2.60 cm MITRAL VALVE                TRICUSPID VALVE MV Area (PHT): 2.81 cm     TR Peak grad:   30.9 mmHg MV Peak grad:  9.0 mmHg     TR Vmax:         278.00 cm/s MV Mean grad:  5.0 mmHg MV Vmax:       1.50 m/s     SHUNTS MV Vmean:      99.3 cm/s    Systemic VTI:  0.10 m MV Decel Time: 270 msec     Systemic Diam: 2.00 cm MV E velocity: 132.00 cm/s Harold HedgeKenneth Fath MD Electronically signed by Harold HedgeKenneth Fath MD Signature Date/Time: 12/06/2019/4:37:56 PM    Final     Assessment and plan- Patient is a 74 y.o. female referred for iron deficiency anemia  Etiology of iron deficiency anemia: Currently unclear and ideally GI bleeding needs to be ruled out.  He did have multiple gastric polyps back in 2018 which could be bleeding.  She has seen Dr. Mechele CollinElliott in the past.  She was recently seen by Dr. Norma Fredricksonoledo for ongoing symptoms of abdominal pain.  She has a CT abdomen scheduled for tomorrow.  Will defer decision for endoscopy to GI  With regards to treatment of iron deficiency anemia: She has moderate anemia and is also on therapeutic anticoagulation for atrial fibrillation.  I will therefore proceed with IV iron.  I recommend 2 doses of Feraheme 510 mg weekly   Discussed risks and benefits of IV iron including all but not limited to nausea, leg swelling, possible risk of infusion reaction.  Patient understands and agrees to proceed as planned.  I will see her back in 2 months with CBC ferritin iron studies B12 and folate and TSH   Thank you for this kind referral and the opportunity to participate in the care of this  Patient   Visit  Diagnosis 1. Iron deficiency anemia, unspecified iron deficiency anemia type     Dr. Owens Shark, MD, MPH [facility] [phone] 12/20/2019 11:59 AM

## 2019-12-21 ENCOUNTER — Ambulatory Visit
Admission: RE | Admit: 2019-12-21 | Discharge: 2019-12-21 | Disposition: A | Discharge status: Home / Self Care | Payer: Medicare Other | Source: Ambulatory Visit | Attending: Student | Admitting: Student

## 2019-12-21 ENCOUNTER — Ambulatory Visit: Payer: Medicare Other

## 2019-12-21 DIAGNOSIS — R1032 Left lower quadrant pain: Secondary | ICD-10-CM | POA: Diagnosis present

## 2019-12-21 DIAGNOSIS — R1031 Right lower quadrant pain: Secondary | ICD-10-CM | POA: Insufficient documentation

## 2019-12-21 DIAGNOSIS — R195 Other fecal abnormalities: Secondary | ICD-10-CM | POA: Insufficient documentation

## 2019-12-21 MED ORDER — IOHEXOL 300 MG/ML  SOLN
100.0000 mL | Freq: Once | INTRAMUSCULAR | Status: AC | PRN
  Administered 2019-12-21: 100 mL via INTRAVENOUS

## 2019-12-22 ENCOUNTER — Inpatient Hospital Stay: Payer: Medicare Other

## 2019-12-22 ENCOUNTER — Other Ambulatory Visit: Payer: Self-pay

## 2019-12-22 VITALS — BP 111/71 | HR 82 | Temp 97.6°F | Resp 16

## 2019-12-22 DIAGNOSIS — D509 Iron deficiency anemia, unspecified: Secondary | ICD-10-CM | POA: Diagnosis not present

## 2019-12-22 MED ORDER — SODIUM CHLORIDE 0.9 % IV SOLN
Freq: Once | INTRAVENOUS | Status: AC
  Filled 2019-12-22: qty 250

## 2019-12-22 MED ORDER — SODIUM CHLORIDE 0.9 % IV SOLN
510.0000 mg | Freq: Once | INTRAVENOUS | Status: AC
  Administered 2019-12-22: 510 mg via INTRAVENOUS
  Filled 2019-12-22: qty 510

## 2019-12-29 ENCOUNTER — Other Ambulatory Visit: Payer: Self-pay

## 2019-12-29 ENCOUNTER — Inpatient Hospital Stay: Payer: Medicare Other

## 2019-12-29 VITALS — BP 112/70 | HR 72 | Temp 99.0°F | Resp 18

## 2019-12-29 DIAGNOSIS — D509 Iron deficiency anemia, unspecified: Secondary | ICD-10-CM | POA: Diagnosis not present

## 2019-12-29 MED ORDER — SODIUM CHLORIDE 0.9 % IV SOLN
Freq: Once | INTRAVENOUS | Status: AC
  Filled 2019-12-29: qty 250

## 2019-12-29 MED ORDER — SODIUM CHLORIDE 0.9 % IV SOLN
510.0000 mg | Freq: Once | INTRAVENOUS | Status: AC
  Administered 2019-12-29: 510 mg via INTRAVENOUS
  Filled 2019-12-29: qty 510

## 2020-01-02 ENCOUNTER — Encounter: Payer: Self-pay | Admitting: Family

## 2020-01-02 ENCOUNTER — Ambulatory Visit: Payer: Medicare Other | Attending: Family | Admitting: Family

## 2020-01-02 ENCOUNTER — Other Ambulatory Visit: Payer: Self-pay

## 2020-01-02 VITALS — BP 160/85 | HR 83 | Resp 16 | Ht 65.0 in | Wt 144.1 lb

## 2020-01-02 DIAGNOSIS — Z79899 Other long term (current) drug therapy: Secondary | ICD-10-CM | POA: Insufficient documentation

## 2020-01-02 DIAGNOSIS — Z713 Dietary counseling and surveillance: Secondary | ICD-10-CM | POA: Diagnosis not present

## 2020-01-02 DIAGNOSIS — Z7901 Long term (current) use of anticoagulants: Secondary | ICD-10-CM | POA: Insufficient documentation

## 2020-01-02 DIAGNOSIS — Z8673 Personal history of transient ischemic attack (TIA), and cerebral infarction without residual deficits: Secondary | ICD-10-CM | POA: Diagnosis not present

## 2020-01-02 DIAGNOSIS — I10 Essential (primary) hypertension: Secondary | ICD-10-CM

## 2020-01-02 DIAGNOSIS — Z79890 Hormone replacement therapy (postmenopausal): Secondary | ICD-10-CM | POA: Insufficient documentation

## 2020-01-02 DIAGNOSIS — I5032 Chronic diastolic (congestive) heart failure: Secondary | ICD-10-CM | POA: Insufficient documentation

## 2020-01-02 DIAGNOSIS — Z888 Allergy status to other drugs, medicaments and biological substances status: Secondary | ICD-10-CM | POA: Insufficient documentation

## 2020-01-02 DIAGNOSIS — I480 Paroxysmal atrial fibrillation: Secondary | ICD-10-CM | POA: Insufficient documentation

## 2020-01-02 DIAGNOSIS — R0989 Other specified symptoms and signs involving the circulatory and respiratory systems: Secondary | ICD-10-CM | POA: Diagnosis not present

## 2020-01-02 DIAGNOSIS — Z9049 Acquired absence of other specified parts of digestive tract: Secondary | ICD-10-CM | POA: Diagnosis not present

## 2020-01-02 DIAGNOSIS — I110 Hypertensive heart disease with heart failure: Secondary | ICD-10-CM | POA: Insufficient documentation

## 2020-01-02 DIAGNOSIS — D649 Anemia, unspecified: Secondary | ICD-10-CM | POA: Diagnosis not present

## 2020-01-02 NOTE — Progress Notes (Signed)
Patient ID: [name], female    DOB: June 29, 1945, 74 y.o.   MRN: [id number]  HPI  Ms Briere is a 74 y/o female with a history of HTN, stroke, anemia, seizures, atrial fibrillation and heart failure.   Echo report from 12/06/19 reviewed and showed an EF of 55-60% along with mildly elevated PA pressure, moderate LAE and mild MR. Echo report from 03/18/2019 reviewed and showed an EF of 60-65% along with moderate/severe MR, trivial TR and moderately elevated PA pressure.   Admitted 12/04/19 due to acute on chronic HF. Cardiology and pulmonology consults obtained. Initially given IV lasix with transition to oral medications. Discharged after 3 days.   She presents today for a follow-up visit with a chief complaint of moderate shortness of breath with minimal exertion. She describes this as having been present for several years. She has associated fatigue and rhinorrhea along with this. She denies any difficulty sleeping, dizziness, abdominal distention, palpitations, pedal edema, chest pain, cough or weight gain.   Waiting on home PT to start.   Past Medical History:  Diagnosis Date   Acute respiratory failure (HCC)    Secondary to aspiration pneumonia    Altered mental status    Secondary to viral herpes simplex virus encephalitis   Anemia    Atrial fibrillation (HCC)    Bilateral pneumonia    CHF (congestive heart failure) (HCC)    Dysphasia    Encephalitis    Hyperglycemia    Hypertension    Hyponatremia    Seizures (HCC)    Stroke [facility])    Past Surgical History:  Procedure Laterality Date   CHOLECYSTECTOMY     COLONOSCOPY WITH PROPOFOL N/A 12/31/2016   Procedure: COLONOSCOPY WITH PROPOFOL;  Surgeon: Scot Jun, MD;  Location: [facility] ENDOSCOPY;  Service: Endoscopy;  Laterality: N/A;   ESOPHAGOGASTRODUODENOSCOPY (EGD) WITH PROPOFOL N/A 12/31/2016   Procedure: ESOPHAGOGASTRODUODENOSCOPY (EGD) WITH PROPOFOL;  Surgeon: Scot Jun, MD;  Location: [facility]  ENDOSCOPY;  Service: Endoscopy;  Laterality: N/A;   TONSILLECTOMY     TUBAL LIGATION     Family History  Problem Relation Age of Onset   Prostate cancer Father    Breast cancer Maternal Aunt    Breast cancer Maternal Aunt    Cerebral palsy Son    Social History   Tobacco Use   Smoking status: Never Smoker   Smokeless tobacco: Never Used  Substance Use Topics   Alcohol use: No   Allergies  Allergen Reactions   Prednisone Hives   Predicort [Prednisolone Acetate]     HIVES,    Prior to Admission medications   Medication Sig Start Date End Date Taking? Authorizing Provider  apixaban (ELIQUIS) 5 MG TABS tablet Take 1 tablet (5 mg total) by mouth 2 (two) times daily. 03/20/19  Yes Altamese Dilling, MD  Calcium Carb-Cholecalciferol (CALCIUM-VITAMIN D) 500-200 MG-UNIT tablet Take 1 tablet by mouth 2 (two) times daily.   Yes [provider]  dicyclomine (BENTYL) 10 MG capsule Take 10 mg by mouth every 6 (six) hours as needed. 12/12/19  Yes [provider]  diltiazem (CARDIZEM CD) 240 MG 24 hr capsule Take 1 capsule (240 mg total) by mouth daily. 03/21/19  Yes Altamese Dilling, MD  folic acid (FOLVITE) 1 MG tablet Take 1 mg by mouth daily. Per tube once daily     Yes [provider]  furosemide (LASIX) 40 MG tablet Take 1 tablet (40 mg total) by mouth daily. 12/08/19  Yes Shahmehdi, Seyed A,  MD  guaiFENesin (MUCINEX) 600 MG 12 hr tablet Take 600 mg by mouth 2 (two) times daily.   Yes [provider]  levothyroxine (SYNTHROID) 75 MCG tablet Take 75 mcg by mouth daily before breakfast.    Yes [provider]  metoprolol succinate (TOPROL-XL) 25 MG 24 hr tablet Take 25 mg by mouth daily. 03/05/19  Yes [provider]  nitroGLYCERIN (NITROSTAT) 0.4 MG SL tablet Place under the tongue. 12/06/19 12/05/20 Yes [provider]  omeprazole (PRILOSEC) 20 MG capsule Take 20 mg by mouth daily.   Yes [provider]   potassium chloride (KLOR-CON) 10 MEQ tablet Take 10 mEq by mouth daily.   Yes [provider]    Review of Systems  Constitutional: Positive for fatigue. Negative for appetite change.  HENT: Positive for rhinorrhea. Negative for congestion and sore throat.   Eyes: Negative.   Respiratory: Positive for shortness of breath (with minimal exertion). Negative for cough.   Cardiovascular: Negative for chest pain, palpitations and leg swelling.  Gastrointestinal: Negative for abdominal distention and abdominal pain.  Endocrine: Negative.   Genitourinary: Negative.   Musculoskeletal: Negative for back pain and neck pain.  Skin: Negative.   Allergic/Immunologic: Negative.   Neurological: Negative for dizziness and light-headedness.  Hematological: Negative for adenopathy. Does not bruise/bleed easily.  Psychiatric/Behavioral: Negative for dysphoric mood and sleep disturbance (sleeping on 2 pillows). The patient is not nervous/anxious.    Vitals:   01/02/20 1148  BP: (!) 160/85  Pulse: 83  Resp: 16  SpO2: 98%  Weight: 144 lb 2 oz (65.4 kg)  Height: 5\' 5"  (1.651 m)   Wt Readings from Last 3 Encounters:  01/02/20 144 lb 2 oz (65.4 kg)  12/20/19 148 lb 9.6 oz (67.4 kg)  12/07/19 148 lb 9.6 oz (67.4 kg)   Lab Results  Component Value Date   CREATININE 0.91 12/07/2019   CREATININE 0.98 12/06/2019   CREATININE 0.81 12/05/2019    Physical Exam Vitals and nursing note reviewed.  Constitutional:      Appearance: Normal appearance.  HENT:     Head: Normocephalic and atraumatic.  Cardiovascular:     Rate and Rhythm: Normal rate. Rhythm irregular.     Heart sounds: Murmur (2/6) heard.   Pulmonary:     Effort: Pulmonary effort is normal. No respiratory distress.     Breath sounds: No wheezing or rales.  Abdominal:     General: There is no distension.     Palpations: Abdomen is soft.     Tenderness: There is no abdominal tenderness.  Musculoskeletal:        General: No  tenderness.     Cervical back: Normal range of motion and neck supple.     Right lower leg: No edema.     Left lower leg: No edema.  Skin:    General: Skin is warm and dry.  Neurological:     General: No focal deficit present.     Mental Status: She is alert and oriented to person, place, and time.  Psychiatric:        Mood and Affect: Mood normal.        Behavior: Behavior normal.        Thought Content: Thought content normal.     Assessment & Plan:  1: Chronic heart failure with preserved ejection fraction with structural changes (LAE)-  - NYHA class III - euvolemic today - weighing daily and home weight chart was reviewed; reminded to call for an  overnight weight gain of >2 pounds or a weekly weight gain of >5 pounds - weight down 3 pounds from last visit here 8 months ago - not adding salt and son and patient have been reading food labels for sodium content. She had been eating a lot of frozen meals because with her short-term memory issues, she doesn't want to use the oven. Son has found lower sodium choices for her.  - waiting on PT to start  - BNP 12/07/19 was 686.2 - reports receiving both her COVID vaccines - currently using incentive spirometry numerous times during the day - wearing compression socks daily with removal at bedtime  2: HTN- - BP mildly elevated today - follows with PCP ([facility] @ [facility]); last seen 11/02/19 - BMP 12/07/19 reviewed and showed sodium 137, potassium 3.5, creatinine 0.91 and GFR >60  3: Paroxymal atrial fibrillation- - on apixaban, diltiazem & metoprolol succinate - saw cardiology (Paraschos) 12/01/19 & returns tomorrow - will need clearance from him about holding apixaban prior to colonoscopy on the 17th    Patient did not bring her medications nor a list. Each medication was verbally reviewed with the patient and she was encouraged to bring the bottles to every visit to confirm accuracy of list.  Patient opts to  not make a return appointment at this time. Advised patient to call us in the future for any questions/problems or to make another appointment and she was comfortable with this plan.

## 2020-01-02 NOTE — Patient Instructions (Addendum)
Continue weighing daily and call for an overnight weight gain of > 2 pounds or a weekly weight gain of >5 pounds.   Call us in the future if you'd like to schedule another appointment 

## 2020-01-13 ENCOUNTER — Other Ambulatory Visit: Payer: Self-pay

## 2020-01-13 ENCOUNTER — Other Ambulatory Visit
Admission: RE | Admit: 2020-01-13 | Discharge: 2020-01-13 | Disposition: A | Discharge status: Home / Self Care | Payer: Medicare Other | Source: Ambulatory Visit | Attending: Gastroenterology | Admitting: Gastroenterology

## 2020-01-13 DIAGNOSIS — Z01812 Encounter for preprocedural laboratory examination: Secondary | ICD-10-CM | POA: Insufficient documentation

## 2020-01-13 DIAGNOSIS — Z20822 Contact with and (suspected) exposure to covid-19: Secondary | ICD-10-CM | POA: Insufficient documentation

## 2020-01-14 LAB — SARS CORONAVIRUS 2 (TAT 6-24 HRS): SARS Coronavirus 2: NEGATIVE

## 2020-01-16 ENCOUNTER — Encounter: Payer: Self-pay | Admitting: Emergency Medicine

## 2020-01-17 ENCOUNTER — Ambulatory Visit
Admission: RE | Admit: 2020-01-17 | Discharge: 2020-01-17 | Disposition: A | Discharge status: Home / Self Care | Payer: Medicare Other | Attending: Gastroenterology | Admitting: Gastroenterology

## 2020-01-17 ENCOUNTER — Ambulatory Visit: Payer: Medicare Other | Admitting: Anesthesiology

## 2020-01-17 ENCOUNTER — Encounter: Payer: Self-pay | Admitting: *Deleted

## 2020-01-17 ENCOUNTER — Encounter
Admission: RE | Disposition: A | Discharge status: Home / Self Care | Payer: Self-pay | Source: Home / Self Care | Attending: Gastroenterology

## 2020-01-17 ENCOUNTER — Other Ambulatory Visit: Payer: Self-pay

## 2020-01-17 DIAGNOSIS — I4891 Unspecified atrial fibrillation: Secondary | ICD-10-CM | POA: Insufficient documentation

## 2020-01-17 DIAGNOSIS — K529 Noninfective gastroenteritis and colitis, unspecified: Secondary | ICD-10-CM | POA: Insufficient documentation

## 2020-01-17 DIAGNOSIS — I110 Hypertensive heart disease with heart failure: Secondary | ICD-10-CM | POA: Insufficient documentation

## 2020-01-17 DIAGNOSIS — K5730 Diverticulosis of large intestine without perforation or abscess without bleeding: Secondary | ICD-10-CM | POA: Diagnosis not present

## 2020-01-17 DIAGNOSIS — K5732 Diverticulitis of large intestine without perforation or abscess without bleeding: Secondary | ICD-10-CM | POA: Diagnosis not present

## 2020-01-17 DIAGNOSIS — D509 Iron deficiency anemia, unspecified: Secondary | ICD-10-CM | POA: Insufficient documentation

## 2020-01-17 DIAGNOSIS — I5030 Unspecified diastolic (congestive) heart failure: Secondary | ICD-10-CM | POA: Insufficient documentation

## 2020-01-17 DIAGNOSIS — Z79899 Other long term (current) drug therapy: Secondary | ICD-10-CM | POA: Insufficient documentation

## 2020-01-17 DIAGNOSIS — Z7901 Long term (current) use of anticoagulants: Secondary | ICD-10-CM | POA: Diagnosis not present

## 2020-01-17 DIAGNOSIS — Z8673 Personal history of transient ischemic attack (TIA), and cerebral infarction without residual deficits: Secondary | ICD-10-CM | POA: Diagnosis not present

## 2020-01-17 DIAGNOSIS — K640 First degree hemorrhoids: Secondary | ICD-10-CM | POA: Diagnosis not present

## 2020-01-17 DIAGNOSIS — Z888 Allergy status to other drugs, medicaments and biological substances status: Secondary | ICD-10-CM | POA: Diagnosis not present

## 2020-01-17 DIAGNOSIS — R933 Abnormal findings on diagnostic imaging of other parts of digestive tract: Secondary | ICD-10-CM | POA: Diagnosis not present

## 2020-01-17 DIAGNOSIS — Z79890 Hormone replacement therapy (postmenopausal): Secondary | ICD-10-CM | POA: Insufficient documentation

## 2020-01-17 HISTORY — PX: COLONOSCOPY: SHX5424

## 2020-01-17 SURGERY — COLONOSCOPY
Anesthesia: General

## 2020-01-17 MED ORDER — LIDOCAINE HCL (PF) 2 % IJ SOLN
INTRAMUSCULAR | Status: AC
  Filled 2020-01-17: qty 5

## 2020-01-17 MED ORDER — LIDOCAINE HCL (CARDIAC) PF 100 MG/5ML IV SOSY
PREFILLED_SYRINGE | INTRAVENOUS | Status: DC | PRN
  Administered 2020-01-17: 30 mg via INTRAVENOUS

## 2020-01-17 MED ORDER — PROPOFOL 500 MG/50ML IV EMUL
INTRAVENOUS | Status: AC
  Filled 2020-01-17: qty 50

## 2020-01-17 MED ORDER — PROPOFOL 10 MG/ML IV BOLUS
INTRAVENOUS | Status: DC | PRN
  Administered 2020-01-17: 20 mg via INTRAVENOUS
  Administered 2020-01-17: 50 mg via INTRAVENOUS
  Administered 2020-01-17: 20 mg via INTRAVENOUS
  Administered 2020-01-17: 50 mg via INTRAVENOUS

## 2020-01-17 MED ORDER — PHENYLEPHRINE HCL (PRESSORS) 10 MG/ML IV SOLN
INTRAVENOUS | Status: DC | PRN
  Administered 2020-01-17: 50 ug via INTRAVENOUS

## 2020-01-17 MED ORDER — PROPOFOL 500 MG/50ML IV EMUL
INTRAVENOUS | Status: DC | PRN
  Administered 2020-01-17: 50 ug/kg/min via INTRAVENOUS

## 2020-01-17 MED ORDER — SODIUM CHLORIDE 0.9 % IV SOLN: INTRAVENOUS | Status: DC

## 2020-01-17 NOTE — Transfer of Care (Signed)
Immediate Anesthesia Transfer of Care Note  Patient: [name]  Procedure(s) Performed: COLONOSCOPY (N/A )  Patient Location: PACU and Endoscopy Unit  Anesthesia Type:MAC  Level of Consciousness: sedated  Airway & Oxygen Therapy: Patient Spontanous Breathing and Patient connected to nasal cannula oxygen  Post-op Assessment: Report given to RN and Post -op Vital signs reviewed and stable  Post vital signs: Reviewed and stable  Last Vitals:  Vitals Value Taken Time  BP 95/81 01/17/20 1126  Temp    Pulse 104 01/17/20 1127  Resp 23 01/17/20 1127  SpO2 98 % 01/17/20 1127  Vitals shown include unvalidated device data.  Last Pain:  Vitals:   01/17/20 0947  TempSrc: Oral  PainSc: 1          Complications: No complications documented.

## 2020-01-17 NOTE — Anesthesia Preprocedure Evaluation (Signed)
Anesthesia Evaluation  Patient identified by MRN, date of birth, ID band Patient awake    Reviewed: Allergy & Precautions, NPO status , Patient's Chart, lab work & pertinent test results  History of Anesthesia Complications Negative for: history of anesthetic complications  Airway Mallampati: III       Dental  (+) Upper Dentures, Lower Dentures   Pulmonary neg sleep apnea, neg COPD, Not current smoker,           Cardiovascular hypertension, Pt. on medications +CHF  (-) Past MI + dysrhythmias Atrial Fibrillation + Valvular Problems/Murmurs      Neuro/Psych Seizures - (5 yeara ago with brain swelling, resolved nb meds),  CVA    GI/Hepatic Neg liver ROS, GERD  Medicated and Controlled,  Endo/Other  neg diabetesHypothyroidism   Renal/GU negative Renal ROS     Musculoskeletal   Abdominal   Peds  Hematology  (+) anemia ,   Anesthesia Other Findings   Reproductive/Obstetrics                            Anesthesia Physical Anesthesia Plan  ASA: III  Anesthesia Plan: General   Post-op Pain Management:    Induction: Intravenous  PONV Risk Score and Plan: 3 and Propofol infusion, TIVA and Treatment may vary due to age or medical condition  Airway Management Planned: Nasal Cannula  Additional Equipment:   Intra-op Plan:   Post-operative Plan:   Informed Consent: I have reviewed the patients History and Physical, chart, labs and discussed the procedure including the risks, benefits and alternatives for the proposed anesthesia with the patient or authorized representative who has indicated his/her understanding and acceptance.       Plan Discussed with:   Anesthesia Plan Comments:         Anesthesia Quick Evaluation

## 2020-01-17 NOTE — Anesthesia Postprocedure Evaluation (Signed)
Anesthesia Post Note  Patient: [name]  Procedure(s) Performed: COLONOSCOPY (N/A )  Patient location during evaluation: Endoscopy Anesthesia Type: General Level of consciousness: awake and alert Pain management: pain level controlled Vital Signs Assessment: post-procedure vital signs reviewed and stable Respiratory status: spontaneous breathing and respiratory function stable Cardiovascular status: stable Anesthetic complications: no   No complications documented.   Last Vitals:  Vitals:   01/17/20 0947 01/17/20 1126  BP: (!) 112/94 95/81  Pulse: (!) 52   Resp: 16   Temp: (!) 36.2 C   SpO2: 99%     Last Pain:  Vitals:   01/17/20 1134  TempSrc:   PainSc: 0-No pain                 Shamar Engelmann K

## 2020-01-17 NOTE — Progress Notes (Signed)
Patient has afib and her heart rate has been fluctuating between the 110's and 120's. Blood pressures are improving. Dr. Henrene Hawking is aware. Will continue to monitor.

## 2020-01-17 NOTE — Op Note (Signed)
[facility] Gastroenterology Patient Name: Roshana Shuffield Procedure Date: 01/17/2020 9:29 AM MRN: [id number] Account #: [id number] Date of Birth: 1946/04/16 Admit Type: Outpatient Age: 74 Room: [facility] ENDO ROOM 3 Gender: Female Note Status: Finalized Procedure:             Colonoscopy Indications:           Iron deficiency anemia, Abnormal CT of the GI tract,                         Diverticulitis Providers:             Andrey Farmer MD, MD Referring MD:          Marrianne Mood, MD (Referring MD) Medicines:             Monitored Anesthesia Care Complications:         No immediate complications. Procedure:             Pre-Anesthesia Assessment:                        - Prior to the procedure, a History and Physical was                         performed, and patient medications and allergies were                         reviewed. The patient is competent. The risks and                         benefits of the procedure and the sedation options and                         risks were discussed with the patient. All questions                         were answered and informed consent was obtained.                         Patient identification and proposed procedure were                         verified by the physician, the nurse, the anesthetist                         and the technician in the endoscopy suite. Mental                         Status Examination: alert and oriented. Airway                         Examination: normal oropharyngeal airway and neck                         mobility. Respiratory Examination: clear to                         auscultation. CV Examination: normal. Prophylactic  Antibiotics: The patient does not require prophylactic                         antibiotics. Prior Anticoagulants: The patient has                         taken Eliquis (apixaban), last dose was 4 days prior                         to procedure. ASA Grade  Assessment: III - A patient                         with severe systemic disease. After reviewing the                         risks and benefits, the patient was deemed in                         satisfactory condition to undergo the procedure. The                         anesthesia plan was to use monitored anesthesia care                         (MAC). Immediately prior to administration of                         medications, the patient was re-assessed for adequacy                         to receive sedatives. The heart rate, respiratory                         rate, oxygen saturations, blood pressure, adequacy of                         pulmonary ventilation, and response to care were                         monitored throughout the procedure. The physical                         status of the patient was re-assessed after the                         procedure.                        After obtaining informed consent, the colonoscope was                         passed under direct vision. Throughout the procedure,                         the patient's blood pressure, pulse, and oxygen                         saturations were monitored continuously. The  Colonoscope was introduced through the anus and                         advanced to the the cecum, identified by appendiceal                         orifice and ileocecal valve. The colonoscopy was                         technically difficult and complex due to restricted                         mobility of the colon. Successful completion of the                         procedure was aided by withdrawing the scope and                         replacing with the adult endoscope. The patient                         tolerated the procedure well. The quality of the bowel                         preparation was adequate to identify polyps. Findings:      The perianal and digital rectal examinations were normal.       Many small-mouthed diverticula were found in the sigmoid colon. There       was significant edema in this area. Biopsies were taken with a cold       forceps for histology. We switched from Peds colonoscopy to EGD scope to       get past this area. We switched back to the Peds colonoscope and was       able to traverse this area. Estimated blood loss was minimal.      A few small-mouthed diverticula were found in the transverse colon.      Internal hemorrhoids were found during retroflexion. The hemorrhoids       were Grade I (internal hemorrhoids that do not prolapse).      The exam was otherwise without abnormality on direct and retroflexion       views. Impression:            - Diverticulosis in the sigmoid colon. Significant                         edema in this area as well leading to significant                         narrowing. Biopsied.                        - Diverticulosis in the transverse colon.                        - Internal hemorrhoids.                        - The examination was otherwise normal on direct and  retroflexion views. Recommendation:        - Discharge patient to home.                        - Resume previous diet.                        - Continue present medications.                        - Await pathology results.                        - Repeat colonoscopy is not recommended due to current                         age (110 years or older).                        - Return to referring physician as previously                         scheduled. Procedure Code(s):     --- Professional ---                        [phone], Colonoscopy, flexible; with biopsy, single or                         multiple Diagnosis Code(s):     --- Professional ---                        K64.0, First degree hemorrhoids                        D50.9, Iron deficiency anemia, unspecified                        K57.32, Diverticulitis of large intestine without                          perforation or abscess without bleeding                        K57.30, Diverticulosis of large intestine without                         perforation or abscess without bleeding                        R93.3, Abnormal findings on diagnostic imaging of                         other parts of digestive tract CPT copyright 2019 [employer]. All rights reserved. The codes documented in this report are preliminary and upon coder review may  be revised to meet current compliance requirements. Andrey Farmer, MD Andrey Farmer MD, MD 01/17/2020 11:26:42 AM Number of Addenda: 0 Note Initiated On: 01/17/2020 9:29 AM Scope Withdrawal Time: 0 hours 6 minutes 54 seconds  Total Procedure Duration: 0 hours 45 minutes 11 seconds  Estimated Blood Loss:  Estimated blood loss: none.      [place]  [facility]

## 2020-01-17 NOTE — Interval H&P Note (Signed)
History and Physical Interval Note:  01/17/2020 10:10 AM  [name]  has presented today for surgery, with the diagnosis of ABNORMAL CT-SCAN.  The various methods of treatment have been discussed with the patient and family. After consideration of risks, benefits and other options for treatment, the patient has consented to  Procedure(s): COLONOSCOPY (N/A) as a surgical intervention.  The patient's history has been reviewed, patient examined, no change in status, stable for surgery.  I have reviewed the patient's chart and labs.  Questions were answered to the patient's satisfaction.     Regis Bill  Ok to proceed with colonoscopy

## 2020-01-17 NOTE — H&P (Signed)
Outpatient short stay form Pre-procedure 01/17/2020 10:07 AM Merlyn Lot MD, MPH  Primary Physician: Dr. Butler Denmark  Reason for visit:  Abnormal Imaging of GI tract  History of present illness:   74 y/o lady with history of diverticulosis and diverticulitis in June. Abdominal pain has improved but CT imaging showing strictured area in sigmoid. Held NOAC for > 48 hours (normal GFR). No family history of Gi malignancies, only had cholecystectomy. Has pretty severe HFpEF but has been cleared by cardiology for procedure.   No current facility-administered medications for this encounter.  Medications Prior to Admission  Medication Sig Dispense Refill Last Dose  . Calcium Carb-Cholecalciferol (CALCIUM-VITAMIN D) 500-200 MG-UNIT tablet Take 1 tablet by mouth 2 (two) times daily.   01/16/2020 at 2000  . guaiFENesin (MUCINEX) 600 MG 12 hr tablet Take 600 mg by mouth 2 (two) times daily.   01/16/2020 at 2000  . metoprolol succinate (TOPROL-XL) 25 MG 24 hr tablet Take 25 mg by mouth daily.   01/16/2020 at 2000  . apixaban (ELIQUIS) 5 MG TABS tablet Take 1 tablet (5 mg total) by mouth 2 (two) times daily. 60 tablet 0 01/13/2020  . dicyclomine (BENTYL) 10 MG capsule Take 10 mg by mouth every 6 (six) hours as needed.     . diltiazem (CARDIZEM CD) 240 MG 24 hr capsule Take 1 capsule (240 mg total) by mouth daily. 30 capsule 0   . folic acid (FOLVITE) 1 MG tablet Take 1 mg by mouth daily. Per tube once daily       . furosemide (LASIX) 40 MG tablet Take 1 tablet (40 mg total) by mouth daily. 30 tablet 1   . levothyroxine (SYNTHROID) 75 MCG tablet Take 75 mcg by mouth daily before breakfast.      . nitroGLYCERIN (NITROSTAT) 0.4 MG SL tablet Place under the tongue.     Marland Kitchen omeprazole (PRILOSEC) 20 MG capsule Take 20 mg by mouth daily.     . potassium chloride (KLOR-CON) 10 MEQ tablet Take 10 mEq by mouth daily.        Allergies  Allergen Reactions  . Prednisone Hives  . Predicort [Prednisolone Acetate]      HIVES,     Past Medical History:  Diagnosis Date  . Acute respiratory failure (HCC)    Secondary to aspiration pneumonia   . Altered mental status    Secondary to viral herpes simplex virus encephalitis  . Anemia   . Atrial fibrillation (HCC)   . Bilateral pneumonia   . CHF (congestive heart failure) (HCC)   . Dysphasia   . Encephalitis   . Hyperglycemia   . Hypertension   . Hyponatremia   . Seizures (HCC)   . Stroke [facility])     Review of systems:  Otherwise negative.    Physical Exam  Gen: Alert, oriented. Appears stated age.  HEENT: [place]/AT. PERRLA. Lungs: No respiratory distress Abd: soft, benign, no masses. BS+ Ext: No edema. Pulses 2+    Planned procedures: Proceed with colonoscopy. The patient understands the nature of the planned procedure, indications, risks, alternatives and potential complications including but not limited to bleeding, infection, perforation, damage to internal organs and possible oversedation/side effects from anesthesia. The patient agrees and gives consent to proceed.  Please refer to procedure notes for findings, recommendations and patient disposition/instructions.     Merlyn Lot MD, MPH. Gastroenterology 01/17/2020  10:07 AM

## 2020-01-17 NOTE — Anesthesia Procedure Notes (Signed)
Procedure Name: MAC Date/Time: 01/17/2020 10:26 AM Performed by: Genevie Ann, CRNA Pre-anesthesia Checklist: Patient identified, Emergency Drugs available, Suction available, Patient being monitored and Timeout performed Oxygen Delivery Method: Nasal cannula

## 2020-01-18 ENCOUNTER — Encounter: Payer: Self-pay | Admitting: Gastroenterology

## 2020-01-18 LAB — SURGICAL PATHOLOGY

## 2020-02-02 ENCOUNTER — Other Ambulatory Visit: Payer: Self-pay

## 2020-02-02 ENCOUNTER — Inpatient Hospital Stay: Payer: Medicare Other | Attending: Oncology

## 2020-02-02 ENCOUNTER — Inpatient Hospital Stay (HOSPITAL_BASED_OUTPATIENT_CLINIC_OR_DEPARTMENT_OTHER): Payer: Medicare Other | Admitting: Oncology

## 2020-02-02 ENCOUNTER — Encounter: Payer: Self-pay | Admitting: Oncology

## 2020-02-02 VITALS — BP 106/68 | HR 78 | Temp 98.4°F | Resp 16 | Ht 65.0 in | Wt 141.0 lb

## 2020-02-02 DIAGNOSIS — D509 Iron deficiency anemia, unspecified: Secondary | ICD-10-CM | POA: Insufficient documentation

## 2020-02-02 DIAGNOSIS — I480 Paroxysmal atrial fibrillation: Secondary | ICD-10-CM | POA: Insufficient documentation

## 2020-02-02 DIAGNOSIS — I10 Essential (primary) hypertension: Secondary | ICD-10-CM | POA: Diagnosis not present

## 2020-02-02 DIAGNOSIS — K5730 Diverticulosis of large intestine without perforation or abscess without bleeding: Secondary | ICD-10-CM | POA: Insufficient documentation

## 2020-02-02 DIAGNOSIS — Z7901 Long term (current) use of anticoagulants: Secondary | ICD-10-CM | POA: Insufficient documentation

## 2020-02-02 DIAGNOSIS — Z8673 Personal history of transient ischemic attack (TIA), and cerebral infarction without residual deficits: Secondary | ICD-10-CM | POA: Insufficient documentation

## 2020-02-02 DIAGNOSIS — Z803 Family history of malignant neoplasm of breast: Secondary | ICD-10-CM | POA: Diagnosis not present

## 2020-02-02 LAB — CBC WITH DIFFERENTIAL/PLATELET
Abs Immature Granulocytes: 0.03 10*3/uL (ref 0.00–0.07)
Basophils Absolute: 0.1 10*3/uL (ref 0.0–0.1)
Basophils Relative: 1 %
Eosinophils Absolute: 0.1 10*3/uL (ref 0.0–0.5)
Eosinophils Relative: 1 %
HCT: 46.1 % — ABNORMAL HIGH (ref 36.0–46.0)
Hemoglobin: 15.4 g/dL — ABNORMAL HIGH (ref 12.0–15.0)
Immature Granulocytes: 0 %
Lymphocytes Relative: 17 %
Lymphs Abs: 1.6 10*3/uL (ref 0.7–4.0)
MCH: 25.4 pg — ABNORMAL LOW (ref 26.0–34.0)
MCHC: 33.4 g/dL (ref 30.0–36.0)
MCV: 76.1 fL — ABNORMAL LOW (ref 80.0–100.0)
Monocytes Absolute: 0.7 10*3/uL (ref 0.1–1.0)
Monocytes Relative: 8 %
Neutro Abs: 6.7 10*3/uL (ref 1.7–7.7)
Neutrophils Relative %: 73 %
Platelets: 253 10*3/uL (ref 150–400)
RBC: 6.06 MIL/uL — ABNORMAL HIGH (ref 3.87–5.11)
RDW: 23.3 % — ABNORMAL HIGH (ref 11.5–15.5)
WBC: 9.2 10*3/uL (ref 4.0–10.5)
nRBC: 0 % (ref 0.0–0.2)

## 2020-02-02 LAB — COMPREHENSIVE METABOLIC PANEL
ALT: 16 U/L (ref 0–44)
AST: 21 U/L (ref 15–41)
Albumin: 4.3 g/dL (ref 3.5–5.0)
Alkaline Phosphatase: 95 U/L (ref 38–126)
Anion gap: 12 (ref 5–15)
BUN: 14 mg/dL (ref 8–23)
CO2: 22 mmol/L (ref 22–32)
Calcium: 9.1 mg/dL (ref 8.9–10.3)
Chloride: 100 mmol/L (ref 98–111)
Creatinine, Ser: 1.02 mg/dL — ABNORMAL HIGH (ref 0.44–1.00)
GFR calc Af Amer: >60 mL/min (ref >60)
GFR calc non Af Amer: 54 mL/min — ABNORMAL LOW (ref >60)
Glucose, Bld: 91 mg/dL (ref 70–99)
Potassium: 4.5 mmol/L (ref 3.5–5.1)
Sodium: 134 mmol/L — ABNORMAL LOW (ref 135–145)
Total Bilirubin: 0.9 mg/dL (ref 0.3–1.2)
Total Protein: 8 g/dL (ref 6.5–8.1)

## 2020-02-02 LAB — IRON AND TIBC
Iron: 69 ug/dL (ref 28–170)
Saturation Ratios: 23 % (ref 10.4–31.8)
TIBC: 297 ug/dL (ref 250–450)
UIBC: 228 ug/dL

## 2020-02-02 LAB — FERRITIN: Ferritin: 190 ng/mL (ref 11–307)

## 2020-02-02 LAB — VITAMIN B12: Vitamin B-12: 538 pg/mL (ref 180–914)

## 2020-02-02 LAB — FOLATE: Folate: >100 ng/mL (ref >5.9)

## 2020-02-02 NOTE — Progress Notes (Signed)
Pt here for IDA. Pt on study drug for atrial fib, CHF. She eats low na diet and doing well from that standpoint. She has IBS and uses cream on rectum bid from her frequent BM. No pain today sleeps good

## 2020-02-02 NOTE — Progress Notes (Signed)
Hematology/Oncology Consult note [facility]  Telephone:([phone] [phone]  Patient Care Team: Mickel Fuchs, MD as PCP - General (Family Medicine)   Name of the patient: [name]  [id number]  April 03, 1946   Date of visit: 02/02/20  Diagnosis- iron deficiency anemia  Chief complaint/ Reason for visit- routine f/u of iron deficiency anemia  Heme/Onc history: Patient is a 74 year old female with a past medical history significant for paroxysmal atrial fibrillation on Eliquis, hypertension, history of CVA pulmonary hypertension. She was recently admitted to the hospital for shortness of breath and chest discomfort and was treated with diuresis.She has undergone EGD back in 2018 which showed a hiatal hernia and multiple gastric polyps.  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  Ferritin levels were low at eight.  Iron saturation low at 5%.  CMP was normal.  CT abdomen on 12/21/2019 showed wall thickening of the proximal portion of the sigmoid colon concerning for acute diverticulitis or chronic sequelae of prior diverticulitis.  Neoplasm not excluded.  Patient underwent colonoscopy which showed edema in the region of sigmoid colon with diverticulosis but no evidence of colon mass.  Given her age further colonoscopy was not recommended.  Patient's labs were consistent with iron deficiency in the past and patient received 2 doses of Feraheme in July 2021.   Interval history-currently reports doing well.  Shortness of breath has improved.  She is also on a research trial for heart failure.  ECOG PS- 2 Pain scale- 0   Review of systems- Review of Systems  Constitutional: Positive for malaise/fatigue. Negative for chills, fever and weight loss.  HENT: Negative for congestion, ear discharge and nosebleeds.   Eyes:  Negative for blurred vision.  Respiratory: Negative for cough, hemoptysis, sputum production, shortness of breath and wheezing.   Cardiovascular: Negative for chest pain, palpitations, orthopnea and claudication.  Gastrointestinal: Negative for abdominal pain, blood in stool, constipation, diarrhea, heartburn, melena, nausea and vomiting.  Genitourinary: Negative for dysuria, flank pain, frequency, hematuria and urgency.  Musculoskeletal: Negative for back pain, joint pain and myalgias.  Skin: Negative for rash.  Neurological: Negative for dizziness, tingling, focal weakness, seizures, weakness and headaches.  Endo/Heme/Allergies: Does not bruise/bleed easily.  Psychiatric/Behavioral: Negative for depression and suicidal ideas. The patient does not have insomnia.       Allergies  Allergen Reactions  . Prednisone Hives  . Predicort [Prednisolone Acetate]     HIVES,     Past Medical History:  Diagnosis Date  . Acute respiratory failure (HCC)    Secondary to aspiration pneumonia   . Altered mental status    Secondary to viral herpes simplex virus encephalitis  . Anemia   . Atrial fibrillation (HCC)   . Bilateral pneumonia   . CHF (congestive heart failure) (HCC)   . Dysphasia   . Encephalitis   . Hyperglycemia   . Hypertension   . Hyponatremia   . IBS (irritable bowel syndrome)   . Seizures (HCC)   . Stroke [facility])      Past Surgical History:  Procedure Laterality Date  . CHOLECYSTECTOMY    . COLONOSCOPY N/A 01/17/2020   Procedure: COLONOSCOPY;  Surgeon: Regis Bill, MD;  Location: [facility] ENDOSCOPY;  Service: Endoscopy;  Laterality: N/A;  . COLONOSCOPY WITH PROPOFOL N/A 12/31/2016   Procedure: COLONOSCOPY WITH PROPOFOL;  Surgeon: Mechele Collin,  Wilber Bihari, MD;  Location: [facility] ENDOSCOPY;  Service: Endoscopy;  Laterality: N/A;  . ESOPHAGOGASTRODUODENOSCOPY (EGD) WITH PROPOFOL N/A 12/31/2016   Procedure: ESOPHAGOGASTRODUODENOSCOPY (EGD) WITH PROPOFOL;  Surgeon: Scot Jun,  MD;  Location: [facility] ENDOSCOPY;  Service: Endoscopy;  Laterality: N/A;  . TONSILLECTOMY    . TUBAL LIGATION      Social History   Socioeconomic History  . Marital status: Widowed    Spouse name: Not on file  . Number of children: 2  . Years of education: Not on file  . Highest education level: Not on file  Occupational History  . Occupation: medical transcript  Tobacco Use  . Smoking status: Never Smoker  . Smokeless tobacco: Never Used  Vaping Use  . Vaping Use: Never used  Substance and Sexual Activity  . Alcohol use: No  . Drug use: No  . Sexual activity: Never  Other Topics Concern  . Not on file  Social History Narrative  . Not on file   Social Determinants of Health   Financial Resource Strain: Low Risk   . Difficulty of Paying Living Expenses: Not hard at all  Food Insecurity: No Food Insecurity  . Worried About Programme researcher, broadcasting/film/video in the Last Year: Never true  . Ran Out of Food in the Last Year: Never true  Transportation Needs: No Transportation Needs  . Lack of Transportation (Medical): No  . Lack of Transportation (Non-Medical): No  Physical Activity: Insufficiently Active  . Days of Exercise per Week: 2 days  . Minutes of Exercise per Session: 30 min  Stress: No Stress Concern Present  . Feeling of Stress : Not at all  Social Connections: Moderately Isolated  . Frequency of Communication with Friends and Family: More than three times a week  . Frequency of Social Gatherings with Friends and Family: More than three times a week  . Attends Religious Services: 1 to 4 times per year  . Active Member of Clubs or Organizations: No  . Attends Banker Meetings: Never  . Marital Status: Widowed  Intimate Partner Violence: Not At Risk  . Fear of Current or Ex-Partner: No  . Emotionally Abused: No  . Physically Abused: No  . Sexually Abused: No    Family History  Problem Relation Age of Onset  . Prostate cancer Father   . Breast cancer  Maternal Aunt   . Breast cancer Maternal Aunt   . Cerebral palsy Son      Current Outpatient Medications:  .  apixaban (ELIQUIS) 5 MG TABS tablet, Take 1 tablet (5 mg total) by mouth 2 (two) times daily., Disp: 60 tablet, Rfl: 0 .  Calcium Carb-Cholecalciferol (CALCIUM-VITAMIN D) 500-200 MG-UNIT tablet, Take 1 tablet by mouth 2 (two) times daily., Disp: , Rfl:  .  dicyclomine (BENTYL) 10 MG capsule, Take 10 mg by mouth every 6 (six) hours as needed., Disp: , Rfl:  .  diltiazem (CARDIZEM CD) 240 MG 24 hr capsule, Take 1 capsule (240 mg total) by mouth daily., Disp: 30 capsule, Rfl: 0 .  folic acid (FOLVITE) 1 MG tablet, Take 1 mg by mouth daily. Per tube once daily  , Disp: , Rfl:  .  furosemide (LASIX) 40 MG tablet, Take 1 tablet (40 mg total) by mouth daily., Disp: 30 tablet, Rfl: 1 .  guaiFENesin (MUCINEX) 600 MG 12 hr tablet, Take 600 mg by mouth 2 (two) times daily., Disp: , Rfl:  .  hydrocortisone cream 0.5 %, Apply 1 application topically  2 (two) times daily., Disp: , Rfl:  .  levothyroxine (SYNTHROID) 75 MCG tablet, Take 75 mcg by mouth daily before breakfast. , Disp: , Rfl:  .  metoprolol succinate (TOPROL-XL) 25 MG 24 hr tablet, Take 25 mg by mouth daily., Disp: , Rfl:  .  nitroGLYCERIN (NITROSTAT) 0.4 MG SL tablet, Place 0.4 mg under the tongue every 5 (five) minutes as needed. , Disp: , Rfl:  .  NON FORMULARY, Take 1 tablet by mouth daily., Disp: , Rfl:  .  omeprazole (PRILOSEC) 20 MG capsule, Take 20 mg by mouth daily., Disp: , Rfl:  .  potassium chloride (KLOR-CON) 10 MEQ tablet, Take 10 mEq by mouth daily., Disp: , Rfl:   Physical exam:  Vitals:   02/02/20 1010  BP: 106/68  Pulse: 78  Resp: 16  Temp: 98.4 F (36.9 C)  TempSrc: Oral  Weight: 141 lb (64 kg)  Height: 5\' 5"  (1.651 m)   Physical Exam Constitutional:      Comments: Ambulates with a walker.no acute distress  HENT:     Head: Normocephalic and atraumatic.  Eyes:     Pupils: Pupils are equal, round, and  reactive to light.  Cardiovascular:     Rate and Rhythm: Normal rate and regular rhythm.     Heart sounds: Murmur heard.   Pulmonary:     Effort: Pulmonary effort is normal.     Breath sounds: Normal breath sounds.  Abdominal:     General: Bowel sounds are normal.     Palpations: Abdomen is soft.  Musculoskeletal:     Cervical back: Normal range of motion.  Skin:    General: Skin is warm and dry.  Neurological:     Mental Status: She is alert and oriented to person, place, and time.      CMP Latest Ref Rng & Units 02/02/2020  Glucose 70 - 99 mg/dL 91  BUN 8 - 23 mg/dL 14  Creatinine 04/03/2020 - 4.09 mg/dL 8.11)  Sodium 9.14(N - 829 mmol/L 134(L)  Potassium 3.5 - 5.1 mmol/L 4.5  Chloride 98 - 111 mmol/L 100  CO2 22 - 32 mmol/L 22  Calcium 8.9 - 10.3 mg/dL 9.1  Total Protein 6.5 - 8.1 g/dL 8.0  Total Bilirubin 0.3 - 1.2 mg/dL 0.9  Alkaline Phos 38 - 126 U/L 95  AST 15 - 41 U/L 21  ALT 0 - 44 U/L 16   CBC Latest Ref Rng & Units 02/02/2020  WBC 4.0 - 10.5 K/uL 9.2  Hemoglobin 12.0 - 15.0 g/dL 15.4(H)  Hematocrit 36 - 46 % 46.1(H)  Platelets 150 - 400 K/uL 253    Assessment and plan- Patient is a 74 y.o. female with h/o iron deficiency anemia here for routine f/u  Patient recently received 2 doses of Feraheme in July 2021 for iron deficiency anemia.  Patient's hemoglobin is improved From 10.8 to 15.4.  Iron studies and B12 and folate are currently pending.  I do not think patient requires IV iron at this time.  Repeat CBC ferritin and iron studies in 3 in 6 months and I will see her back in 6 months   Visit Diagnosis 1. Iron deficiency anemia, unspecified iron deficiency anemia type      Dr. August 2021, MD, MPH [facility] 02/02/2020 1:05 PM

## 2020-02-22 ENCOUNTER — Emergency Department
Admission: EM | Admit: 2020-02-22 | Discharge: 2020-02-22 | Disposition: A | Discharge status: Home / Self Care | Payer: Medicare Other | Attending: Emergency Medicine | Admitting: Emergency Medicine

## 2020-02-22 ENCOUNTER — Emergency Department: Payer: Medicare Other

## 2020-02-22 ENCOUNTER — Encounter: Payer: Self-pay | Admitting: Emergency Medicine

## 2020-02-22 DIAGNOSIS — E039 Hypothyroidism, unspecified: Secondary | ICD-10-CM | POA: Diagnosis not present

## 2020-02-22 DIAGNOSIS — I509 Heart failure, unspecified: Secondary | ICD-10-CM

## 2020-02-22 DIAGNOSIS — Z20822 Contact with and (suspected) exposure to covid-19: Secondary | ICD-10-CM | POA: Insufficient documentation

## 2020-02-22 DIAGNOSIS — I5033 Acute on chronic diastolic (congestive) heart failure: Secondary | ICD-10-CM | POA: Diagnosis not present

## 2020-02-22 DIAGNOSIS — R0602 Shortness of breath: Secondary | ICD-10-CM | POA: Diagnosis present

## 2020-02-22 DIAGNOSIS — Z79899 Other long term (current) drug therapy: Secondary | ICD-10-CM | POA: Diagnosis not present

## 2020-02-22 DIAGNOSIS — I110 Hypertensive heart disease with heart failure: Secondary | ICD-10-CM | POA: Diagnosis not present

## 2020-02-22 LAB — CBC
HCT: 37.7 % (ref 36.0–46.0)
Hemoglobin: 12.7 g/dL (ref 12.0–15.0)
MCH: 26.1 pg (ref 26.0–34.0)
MCHC: 33.7 g/dL (ref 30.0–36.0)
MCV: 77.6 fL — ABNORMAL LOW (ref 80.0–100.0)
Platelets: 250 10*3/uL (ref 150–400)
RBC: 4.86 MIL/uL (ref 3.87–5.11)
RDW: 21.6 % — ABNORMAL HIGH (ref 11.5–15.5)
WBC: 10.8 10*3/uL — ABNORMAL HIGH (ref 4.0–10.5)
nRBC: 0 % (ref 0.0–0.2)

## 2020-02-22 LAB — TROPONIN I (HIGH SENSITIVITY): Troponin I (High Sensitivity): 7 ng/L (ref <18)

## 2020-02-22 LAB — BASIC METABOLIC PANEL
Anion gap: 13 (ref 5–15)
BUN: 11 mg/dL (ref 8–23)
CO2: 22 mmol/L (ref 22–32)
Calcium: 8.6 mg/dL — ABNORMAL LOW (ref 8.9–10.3)
Chloride: 103 mmol/L (ref 98–111)
Creatinine, Ser: 0.75 mg/dL (ref 0.44–1.00)
GFR calc Af Amer: >60 mL/min (ref >60)
GFR calc non Af Amer: >60 mL/min (ref >60)
Glucose, Bld: 123 mg/dL — ABNORMAL HIGH (ref 70–99)
Potassium: 3.6 mmol/L (ref 3.5–5.1)
Sodium: 138 mmol/L (ref 135–145)

## 2020-02-22 LAB — SARS CORONAVIRUS 2 BY RT PCR (HOSPITAL ORDER, PERFORMED IN ~~LOC~~ HOSPITAL LAB): SARS Coronavirus 2: NEGATIVE

## 2020-02-22 MED ORDER — FUROSEMIDE 10 MG/ML IJ SOLN
40.0000 mg | Freq: Once | INTRAMUSCULAR | Status: AC
  Administered 2020-02-22: 40 mg via INTRAVENOUS
  Filled 2020-02-22: qty 4

## 2020-02-22 NOTE — ED Notes (Signed)
Delayed discharge. Unable to find a ride home. Called numerous numbers to no avail.

## 2020-02-22 NOTE — ED Provider Notes (Signed)
[facility] Emergency Department Provider Note   ____________________________________________    I have reviewed the triage vital signs and the nursing notes.   HISTORY  Chief Complaint Shortness of Breath     HPI [name] is a 74 y.o. female with a history of CHF, atrial fibrillation on Eliquis who presents with complaints of shortness of breath.  Patient notes last night when she was lying down to sleep she felt short of breath.  She called her insurance helpline and was instructed to take 3 of her 20 mg Lasix pills which she did do.  However she continued to feel short of breath so presented to the emergency department.  She does report compliance with her medications.  No fevers or chills.  Reports at this time she is actually feeling improved.  Reviewed medical records, patient with EF of 55 to 60%, does see Dr. Darrold Junker of cardiology  Past Medical History:  Diagnosis Date  . Acute respiratory failure (HCC)    Secondary to aspiration pneumonia   . Altered mental status    Secondary to viral herpes simplex virus encephalitis  . Anemia   . Atrial fibrillation (HCC)   . Bilateral pneumonia   . CHF (congestive heart failure) (HCC)   . Dysphasia   . Encephalitis   . Hyperglycemia   . Hypertension   . Hyponatremia   . IBS (irritable bowel syndrome)   . Seizures (HCC)   . Stroke [facility])     Patient Active Problem List   Diagnosis Date Noted  . Iron deficiency anemia 12/20/2019  . Acute on chronic diastolic CHF (congestive heart failure) (HCC) 12/04/2019  . Atrial fibrillation (HCC)   . Hypertension   . Stroke (HCC)   . Hypothyroidism   . Acute respiratory failure with hypoxia (HCC)   . Acute congestive heart failure (HCC) 03/17/2019  . Acute gastroenteritis 07/05/2017  . Acute respiratory failure (HCC) 03/27/2011    Past Surgical History:  Procedure Laterality Date  . CHOLECYSTECTOMY    . COLONOSCOPY N/A 01/17/2020   Procedure:  COLONOSCOPY;  Surgeon: Regis Bill, MD;  Location[facility] ENDOSCOPY;  Service: Endoscopy;  Laterality: N/A;  . COLONOSCOPY WITH PROPOFOL N/A 12/31/2016   Procedure: COLONOSCOPY WITH PROPOFOL;  Surgeon: Scot Jun, MD;  Location: [facility] ENDOSCOPY;  Service: Endoscopy;  Laterality: N/A;  . ESOPHAGOGASTRODUODENOSCOPY (EGD) WITH PROPOFOL N/A 12/31/2016   Procedure: ESOPHAGOGASTRODUODENOSCOPY (EGD) WITH PROPOFOL;  Surgeon: Scot Jun, MD;  Location: [facility] ENDOSCOPY;  Service: Endoscopy;  Laterality: N/A;  . TONSILLECTOMY    . TUBAL LIGATION      Prior to Admission medications   Medication Sig Start Date End Date Taking? Authorizing Provider  apixaban (ELIQUIS) 5 MG TABS tablet Take 1 tablet (5 mg total) by mouth 2 (two) times daily. 03/20/19   Altamese Dilling, MD  Calcium Carb-Cholecalciferol (CALCIUM-VITAMIN D) 500-200 MG-UNIT tablet Take 1 tablet by mouth 2 (two) times daily.    [provider]  dicyclomine (BENTYL) 10 MG capsule Take 10 mg by mouth every 6 (six) hours as needed. 12/12/19   [provider]  diltiazem (CARDIZEM CD) 240 MG 24 hr capsule Take 1 capsule (240 mg total) by mouth daily. 03/21/19   Altamese Dilling, MD  folic acid (FOLVITE) 1 MG tablet Take 1 mg by mouth daily. Per tube once daily      [provider]  furosemide (LASIX) 40 MG tablet Take 1 tablet (40 mg total) by mouth daily. 12/08/19  Shahmehdi, Gemma Payor, MD  guaiFENesin (MUCINEX) 600 MG 12 hr tablet Take 600 mg by mouth 2 (two) times daily.    [provider]  hydrocortisone cream 0.5 % Apply 1 application topically 2 (two) times daily.    [provider]  levothyroxine (SYNTHROID) 75 MCG tablet Take 75 mcg by mouth daily before breakfast.     [provider]  metoprolol succinate (TOPROL-XL) 25 MG 24 hr tablet Take 25 mg by mouth daily. 03/05/19   [provider]  nitroGLYCERIN (NITROSTAT) 0.4 MG SL tablet Place 0.4 mg under the  tongue every 5 (five) minutes as needed.  12/06/19 12/05/20  [provider]  NON FORMULARY Take 1 tablet by mouth daily.    [provider]  omeprazole (PRILOSEC) 20 MG capsule Take 20 mg by mouth daily.    [provider]  potassium chloride (KLOR-CON) 10 MEQ tablet Take 10 mEq by mouth daily.    [provider]     Allergies Prednisone and Predicort [prednisolone acetate]  Family History  Problem Relation Age of Onset  . Prostate cancer Father   . Breast cancer Maternal Aunt   . Breast cancer Maternal Aunt   . Cerebral palsy Son     Social History Social History   Tobacco Use  . Smoking status: Never Smoker  . Smokeless tobacco: Never Used  Vaping Use  . Vaping Use: Never used  Substance Use Topics  . Alcohol use: No  . Drug use: No    Review of Systems  Constitutional: No fever/chills Eyes: No visual changes.  ENT: No sore throat. Cardiovascular: Denies chest pain. Respiratory: Shortness of breath while lying down Gastrointestinal: No abdominal pain.  No nausea, no vomiting.   Genitourinary: Negative for dysuria. Musculoskeletal: Negative for back pain. Skin: Negative for rash. Neurological: Negative for headaches   ____________________________________________   PHYSICAL EXAM:  VITAL SIGNS: ED Triage Vitals  Enc Vitals Group     BP 02/22/20 0114 112/72     Pulse Rate 02/22/20 0114 (!) 131     Resp 02/22/20 0114 (!) 22     Temp 02/22/20 0114 98.9 F (37.2 C)     Temp Source 02/22/20 0114 Oral     SpO2 02/22/20 0102 96 %     Weight --      Height --      Head Circumference --      Peak Flow --      Pain Score --      Pain Loc --      Pain Edu? --      Excl. in GC? --     Constitutional: Alert and oriented.  Eyes: Conjunctivae are normal.   Mouth/Throat: Mucous membranes are moist.   Neck:  Painless ROM Cardiovascular: Mild tachycardia, irregular rhythm. Grossly normal heart sounds.  Good peripheral  circulation. Respiratory: Normal respiratory effort.  No retractions.  Faint bibasilar Rales Gastrointestinal: Soft and nontender. No distention.  No CVA tenderness. G Musculoskeletal: No lower extremity tenderness nor edema.  Warm and well perfused Neurologic:  Normal speech and language. No gross focal neurologic deficits are appreciated.  Skin:  Skin is warm, dry and intact. No rash noted. Psychiatric: Mood and affect are normal. Speech and behavior are normal.  ____________________________________________   LABS (all labs ordered are listed, but only abnormal results are displayed)  Labs Reviewed  BASIC METABOLIC PANEL - Abnormal; Notable for the following components:      Result Value  Glucose, Bld 123 (*)    Calcium 8.6 (*)    All other components within normal limits  CBC - Abnormal; Notable for the following components:   WBC 10.8 (*)    MCV 77.6 (*)    RDW 21.6 (*)    All other components within normal limits  SARS CORONAVIRUS 2 BY RT PCR (HOSPITAL ORDER, PERFORMED IN [place] [facility])  TROPONIN I (HIGH SENSITIVITY)  TROPONIN I (HIGH SENSITIVITY)   ____________________________________________  EKG  ED ECG REPORT I, Jene Every, the attending physician, personally viewed and interpreted this ECG.  Date: 02/22/2020  Rhythm: Atrial fibrillation QRS Axis: normal Intervals: Abnormal ST/T Wave abnormalities: normal Narrative Interpretation: A. fib with RVR  ____________________________________________  RADIOLOGY  Chest x-ray reviewed by me, no pneumonia, no pulmonary edema ____________________________________________   PROCEDURES  Procedure(s) performed: No  Procedures   Critical Care performed: No ____________________________________________   INITIAL IMPRESSION / ASSESSMENT AND PLAN / ED COURSE  Pertinent labs & imaging results that were available during my care of the patient were reviewed by me and considered in my medical decision  making (see chart for details).  Patient presents with shortness of breath while lying down as described above.  Differential includes CHF, pneumonia, COVID-19, pleural effusion, A. fib  She is feeling better now, likely related to taking 60 mg of Lasix last night.  She reports she has been diuresing well.  Chest x-ray is overall reassuring, mild vascular congestion read by radiology.  Lab work is unremarkable, electrolytes and creatinine are normal.  Her heart rate is 101 on the monitor during my exam with atrial fibrillation  She did take her morning medications while in the emergency department.  Discussed with Dr. Juliann Pares of cardiology who recommends giving IV Lasix and appropriate for discharge with close follow-up with her cardiologist Dr. Darrold Junker  Patient is comfortable with this plan    ____________________________________________   FINAL CLINICAL IMPRESSION(S) / ED DIAGNOSES  Final diagnoses:  Acute on chronic congestive heart failure, unspecified heart failure type [facility])        Note:  This document was prepared using Dragon voice recognition software and may include unintentional dictation errors.   Jene Every, MD 02/22/20 [phone]

## 2020-02-22 NOTE — ED Notes (Signed)
Pt AFIB RVR with a rate of 131bpm.

## 2020-02-22 NOTE — ED Triage Notes (Signed)
Pt arrived via EMS from home where she called out due to SOB while laying down. Pt denies chest pain, N/V. Hx/o AFib.

## 2020-02-22 NOTE — Progress Notes (Deleted)
Patient ID: [name], female    DOB: 1945/07/16, 74 y.o.   MRN: [id number]  HPI  [name] is a 74 y/o female with a history of HTN, stroke, anemia, seizures, atrial fibrillation and heart failure.   Echo report from 12/06/19 reviewed and showed an EF of 55-60% along with mildly elevated PA pressure, moderate LAE and mild MR. Echo report from 03/18/2019 reviewed and showed an EF of 60-65% along with moderate/severe MR, trivial TR and moderately elevated PA pressure.   Was in the ED 02/22/20 due to acute on chronic heart failure. Took extra furosemide but shortness of breath persisted. IV lasix given and she was released. Admitted 12/04/19 due to acute on chronic HF. Cardiology and pulmonology consults obtained. Initially given IV lasix with transition to oral medications. Discharged after 3 days.   She presents today for a follow-up visit with a chief complaint of   Past Medical History:  Diagnosis Date  . Acute respiratory failure (HCC)    Secondary to aspiration pneumonia   . Altered mental status    Secondary to viral herpes simplex virus encephalitis  . Anemia   . Atrial fibrillation (HCC)   . Bilateral pneumonia   . CHF (congestive heart failure) (HCC)   . Dysphasia   . Encephalitis   . Hyperglycemia   . Hypertension   . Hyponatremia   . IBS (irritable bowel syndrome)   . Seizures (HCC)   . Stroke [facility])    Past Surgical History:  Procedure Laterality Date  . CHOLECYSTECTOMY    . COLONOSCOPY N/A 01/17/2020   Procedure: COLONOSCOPY;  Surgeon: Regis Bill, MD;  Location: [facility] ENDOSCOPY;  Service: Endoscopy;  Laterality: N/A;  . COLONOSCOPY WITH PROPOFOL N/A 12/31/2016   Procedure: COLONOSCOPY WITH PROPOFOL;  Surgeon: Scot Jun, MD;  Location: [facility] ENDOSCOPY;  Service: Endoscopy;  Laterality: N/A;  . ESOPHAGOGASTRODUODENOSCOPY (EGD) WITH PROPOFOL N/A 12/31/2016   Procedure: ESOPHAGOGASTRODUODENOSCOPY (EGD) WITH PROPOFOL;  Surgeon: Scot Jun, MD;  Location:  [facility] ENDOSCOPY;  Service: Endoscopy;  Laterality: N/A;  . TONSILLECTOMY    . TUBAL LIGATION     Family History  Problem Relation Age of Onset  . Prostate cancer Father   . Breast cancer Maternal Aunt   . Breast cancer Maternal Aunt   . Cerebral palsy Son    Social History   Tobacco Use  . Smoking status: Never Smoker  . Smokeless tobacco: Never Used  Substance Use Topics  . Alcohol use: No   Allergies  Allergen Reactions  . Prednisone Hives  . Predicort [Prednisolone Acetate]     HIVES,      Review of Systems  Constitutional: Positive for fatigue. Negative for appetite change.  HENT: Positive for rhinorrhea. Negative for congestion and sore throat.   Eyes: Negative.   Respiratory: Positive for shortness of breath (with minimal exertion). Negative for cough.   Cardiovascular: Negative for chest pain, palpitations and leg swelling.  Gastrointestinal: Negative for abdominal distention and abdominal pain.  Endocrine: Negative.   Genitourinary: Negative.   Musculoskeletal: Negative for back pain and neck pain.  Skin: Negative.   Allergic/Immunologic: Negative.   Neurological: Negative for dizziness and light-headedness.  Hematological: Negative for adenopathy. Does not bruise/bleed easily.  Psychiatric/Behavioral: Negative for dysphoric mood and sleep disturbance (sleeping on 2 pillows). The patient is not nervous/anxious.      Physical Exam Vitals and nursing note reviewed.  Constitutional:      Appearance: Normal appearance.  HENT:  Head: Normocephalic and atraumatic.  Cardiovascular:     Rate and Rhythm: Normal rate. Rhythm irregular.     Heart sounds: Murmur (2/6) heard.   Pulmonary:     Effort: Pulmonary effort is normal. No respiratory distress.     Breath sounds: No wheezing or rales.  Abdominal:     General: There is no distension.     Palpations: Abdomen is soft.     Tenderness: There is no abdominal tenderness.  Musculoskeletal:        General:  No tenderness.     Cervical back: Normal range of motion and neck supple.     Right lower leg: No edema.     Left lower leg: No edema.  Skin:    General: Skin is warm and dry.  Neurological:     General: No focal deficit present.     Mental Status: She is alert and oriented to person, place, and time.  Psychiatric:        Mood and Affect: Mood normal.        Behavior: Behavior normal.        Thought Content: Thought content normal.     Assessment & Plan:  1: Chronic heart failure with preserved ejection fraction with structural changes (LAE)-  - NYHA class III - euvolemic today - weighing daily and home weight chart was reviewed; reminded to call for an overnight weight gain of >2 pounds or a weekly weight gain of >5 pounds - weight 144.2 pounds from last visit here 2 months ago - not adding salt and son and patient have been reading food labels for sodium content. She had been eating a lot of frozen meals because with her short-term memory issues, she doesn't want to use the oven. Son has found lower sodium choices for her.  - saw cardiology (Paraschos) 01/03/20 - reports receiving both her COVID vaccines - currently using incentive spirometry numerous times during the day - wearing compression socks daily with removal at bedtime - BNP 12/07/19 was 686.2  2: HTN- - BP  - follows with PCP (W[facility]th @ [facility]); last seen 11/02/19 - BMP 02/22/20 reviewed and showed sodium 138, potassium 3.6, creatinine 0.75 and GFR >60  3: Paroxymal atrial fibrillation- - on apixaban, diltiazem & metoprolol succinate - saw cardiology (Paraschos) 01/03/20    Patient did not bring her medications nor a list. Each medication was verbally reviewed with the patient and she was encouraged to bring the bottles to every visit to confirm accuracy of list.

## 2020-02-24 ENCOUNTER — Ambulatory Visit: Payer: Medicare Other | Admitting: Family

## 2020-02-24 ENCOUNTER — Telehealth: Payer: Self-pay | Admitting: Family

## 2020-02-24 NOTE — Telephone Encounter (Signed)
Patient did not show for her [facility] appointment on 02/24/20. Will attempt to reschedule.

## 2020-03-11 ENCOUNTER — Encounter: Payer: Self-pay | Admitting: Emergency Medicine

## 2020-03-11 ENCOUNTER — Emergency Department: Payer: Medicare Other

## 2020-03-11 ENCOUNTER — Other Ambulatory Visit: Payer: Self-pay

## 2020-03-11 ENCOUNTER — Inpatient Hospital Stay
Admission: EM | Admit: 2020-03-11 | Discharge: 2020-03-20 | DRG: 330 | Disposition: A | Discharge status: Skilled Nursing Facility | Payer: Medicare Other | Attending: Hospitalist | Admitting: Hospitalist

## 2020-03-11 DIAGNOSIS — Z66 Do not resuscitate: Secondary | ICD-10-CM | POA: Diagnosis present

## 2020-03-11 DIAGNOSIS — Z7901 Long term (current) use of anticoagulants: Secondary | ICD-10-CM

## 2020-03-11 DIAGNOSIS — E871 Hypo-osmolality and hyponatremia: Secondary | ICD-10-CM | POA: Diagnosis present

## 2020-03-11 DIAGNOSIS — Z8042 Family history of malignant neoplasm of prostate: Secondary | ICD-10-CM

## 2020-03-11 DIAGNOSIS — Z8673 Personal history of transient ischemic attack (TIA), and cerebral infarction without residual deficits: Secondary | ICD-10-CM

## 2020-03-11 DIAGNOSIS — I110 Hypertensive heart disease with heart failure: Secondary | ICD-10-CM | POA: Diagnosis present

## 2020-03-11 DIAGNOSIS — I4891 Unspecified atrial fibrillation: Secondary | ICD-10-CM | POA: Diagnosis not present

## 2020-03-11 DIAGNOSIS — I5032 Chronic diastolic (congestive) heart failure: Secondary | ICD-10-CM

## 2020-03-11 DIAGNOSIS — R140 Abdominal distension (gaseous): Secondary | ICD-10-CM

## 2020-03-11 DIAGNOSIS — K589 Irritable bowel syndrome without diarrhea: Secondary | ICD-10-CM | POA: Diagnosis present

## 2020-03-11 DIAGNOSIS — E872 Acidosis: Secondary | ICD-10-CM | POA: Diagnosis not present

## 2020-03-11 DIAGNOSIS — Z79899 Other long term (current) drug therapy: Secondary | ICD-10-CM

## 2020-03-11 DIAGNOSIS — R109 Unspecified abdominal pain: Secondary | ICD-10-CM

## 2020-03-11 DIAGNOSIS — J189 Pneumonia, unspecified organism: Secondary | ICD-10-CM

## 2020-03-11 DIAGNOSIS — D72829 Elevated white blood cell count, unspecified: Secondary | ICD-10-CM | POA: Diagnosis not present

## 2020-03-11 DIAGNOSIS — I4821 Permanent atrial fibrillation: Secondary | ICD-10-CM | POA: Diagnosis present

## 2020-03-11 DIAGNOSIS — E861 Hypovolemia: Secondary | ICD-10-CM | POA: Diagnosis present

## 2020-03-11 DIAGNOSIS — E878 Other disorders of electrolyte and fluid balance, not elsewhere classified: Secondary | ICD-10-CM | POA: Diagnosis not present

## 2020-03-11 DIAGNOSIS — K648 Other hemorrhoids: Secondary | ICD-10-CM | POA: Diagnosis present

## 2020-03-11 DIAGNOSIS — I4892 Unspecified atrial flutter: Secondary | ICD-10-CM | POA: Diagnosis not present

## 2020-03-11 DIAGNOSIS — Z8661 Personal history of infections of the central nervous system: Secondary | ICD-10-CM

## 2020-03-11 DIAGNOSIS — R000 Tachycardia, unspecified: Secondary | ICD-10-CM

## 2020-03-11 DIAGNOSIS — K56609 Unspecified intestinal obstruction, unspecified as to partial versus complete obstruction: Secondary | ICD-10-CM

## 2020-03-11 DIAGNOSIS — K56691 Other complete intestinal obstruction: Principal | ICD-10-CM | POA: Diagnosis present

## 2020-03-11 DIAGNOSIS — Z20822 Contact with and (suspected) exposure to covid-19: Secondary | ICD-10-CM | POA: Diagnosis present

## 2020-03-11 DIAGNOSIS — E039 Hypothyroidism, unspecified: Secondary | ICD-10-CM | POA: Diagnosis present

## 2020-03-11 DIAGNOSIS — I480 Paroxysmal atrial fibrillation: Secondary | ICD-10-CM | POA: Diagnosis not present

## 2020-03-11 DIAGNOSIS — I9581 Postprocedural hypotension: Secondary | ICD-10-CM | POA: Diagnosis not present

## 2020-03-11 DIAGNOSIS — Z803 Family history of malignant neoplasm of breast: Secondary | ICD-10-CM | POA: Diagnosis not present

## 2020-03-11 DIAGNOSIS — R0600 Dyspnea, unspecified: Secondary | ICD-10-CM

## 2020-03-11 DIAGNOSIS — Z6823 Body mass index (BMI) 23.0-23.9, adult: Secondary | ICD-10-CM

## 2020-03-11 DIAGNOSIS — I483 Typical atrial flutter: Secondary | ICD-10-CM | POA: Diagnosis not present

## 2020-03-11 DIAGNOSIS — K56601 Complete intestinal obstruction, unspecified as to cause: Secondary | ICD-10-CM | POA: Diagnosis not present

## 2020-03-11 DIAGNOSIS — E440 Moderate protein-calorie malnutrition: Secondary | ICD-10-CM | POA: Insufficient documentation

## 2020-03-11 LAB — URINALYSIS, COMPLETE (UACMP) WITH MICROSCOPIC
Bacteria, UA: NONE SEEN
Bilirubin Urine: NEGATIVE
Glucose, UA: NEGATIVE mg/dL
Ketones, ur: 5 mg/dL — AB
Leukocytes,Ua: NEGATIVE
Nitrite: NEGATIVE
Protein, ur: NEGATIVE mg/dL
Specific Gravity, Urine: >1.046 — ABNORMAL HIGH (ref 1.005–1.030)
pH: 5 (ref 5.0–8.0)

## 2020-03-11 LAB — CBC
HCT: 46.3 % — ABNORMAL HIGH (ref 36.0–46.0)
Hemoglobin: 15.3 g/dL — ABNORMAL HIGH (ref 12.0–15.0)
MCH: 26.2 pg (ref 26.0–34.0)
MCHC: 33 g/dL (ref 30.0–36.0)
MCV: 79.3 fL — ABNORMAL LOW (ref 80.0–100.0)
Platelets: 290 10*3/uL (ref 150–400)
RBC: 5.84 MIL/uL — ABNORMAL HIGH (ref 3.87–5.11)
RDW: 18.7 % — ABNORMAL HIGH (ref 11.5–15.5)
WBC: 13.3 10*3/uL — ABNORMAL HIGH (ref 4.0–10.5)
nRBC: 0 % (ref 0.0–0.2)

## 2020-03-11 LAB — COMPREHENSIVE METABOLIC PANEL
ALT: 20 U/L (ref 0–44)
AST: 30 U/L (ref 15–41)
Albumin: 4.1 g/dL (ref 3.5–5.0)
Alkaline Phosphatase: 83 U/L (ref 38–126)
Anion gap: 13 (ref 5–15)
BUN: 22 mg/dL (ref 8–23)
CO2: 22 mmol/L (ref 22–32)
Calcium: 9.2 mg/dL (ref 8.9–10.3)
Chloride: 91 mmol/L — ABNORMAL LOW (ref 98–111)
Creatinine, Ser: 0.98 mg/dL (ref 0.44–1.00)
GFR, Estimated: 56 mL/min — ABNORMAL LOW (ref >60)
Glucose, Bld: 125 mg/dL — ABNORMAL HIGH (ref 70–99)
Potassium: 4.6 mmol/L (ref 3.5–5.1)
Sodium: 126 mmol/L — ABNORMAL LOW (ref 135–145)
Total Bilirubin: 1.5 mg/dL — ABNORMAL HIGH (ref 0.3–1.2)
Total Protein: 8.1 g/dL (ref 6.5–8.1)

## 2020-03-11 LAB — LACTIC ACID, PLASMA: Lactic Acid, Venous: 1.4 mmol/L (ref 0.5–1.9)

## 2020-03-11 LAB — LIPASE, BLOOD: Lipase: 27 U/L (ref 11–51)

## 2020-03-11 MED ORDER — MORPHINE SULFATE (PF) 2 MG/ML IV SOLN
2.0000 mg | Freq: Once | INTRAVENOUS | Status: AC
  Administered 2020-03-11: 2 mg via INTRAVENOUS
  Filled 2020-03-11: qty 1

## 2020-03-11 MED ORDER — SODIUM CHLORIDE 0.9 % IV BOLUS
500.0000 mL | Freq: Once | INTRAVENOUS | Status: AC
  Administered 2020-03-11: 500 mL via INTRAVENOUS

## 2020-03-11 MED ORDER — IOHEXOL 300 MG/ML  SOLN
100.0000 mL | Freq: Once | INTRAMUSCULAR | Status: AC | PRN
  Administered 2020-03-11: 100 mL via INTRAVENOUS

## 2020-03-11 MED ORDER — ONDANSETRON HCL 4 MG/2ML IJ SOLN
4.0000 mg | Freq: Once | INTRAMUSCULAR | Status: AC
  Administered 2020-03-11: 4 mg via INTRAVENOUS
  Filled 2020-03-11: qty 2

## 2020-03-11 NOTE — ED Notes (Signed)
Abdo pain 3/10.

## 2020-03-11 NOTE — ED Notes (Signed)
Phone provided per pt request; pt is on the phone now talking to family member

## 2020-03-11 NOTE — ED Notes (Signed)
Provided pillow to pt.

## 2020-03-11 NOTE — ED Provider Notes (Signed)
[facility] Emergency Department Provider Note   ____________________________________________    I have reviewed the triage vital signs and the nursing notes.   HISTORY  Chief Complaint Abdominal Pain     HPI [name] is a 74 y.o. female with history of atrial fibrillation, CHF, cholecystectomy with recent colonoscopy who presents with complaints of abdominal distention.  Patient reports she has not had a bowel movement in some time.  She did take laxatives unsuccessfully.  She did have nausea however that has resolved.  She reports her abdomen is much bigger than usual.  Denies fevers or chills.  Past Medical History:  Diagnosis Date  . Acute respiratory failure (HCC)    Secondary to aspiration pneumonia   . Altered mental status    Secondary to viral herpes simplex virus encephalitis  . Anemia   . Atrial fibrillation (HCC)   . Bilateral pneumonia   . CHF (congestive heart failure) (HCC)   . Dysphasia   . Encephalitis   . Hyperglycemia   . Hypertension   . Hyponatremia   . IBS (irritable bowel syndrome)   . Seizures (HCC)   . Stroke [facility])     Patient Active Problem List   Diagnosis Date Noted  . Iron deficiency anemia 12/20/2019  . Acute on chronic diastolic CHF (congestive heart failure) (HCC) 12/04/2019  . Atrial fibrillation (HCC)   . Hypertension   . Stroke (HCC)   . Hypothyroidism   . Acute respiratory failure with hypoxia (HCC)   . Acute congestive heart failure (HCC) 03/17/2019  . Acute gastroenteritis 07/05/2017  . Acute respiratory failure (HCC) 03/27/2011    Past Surgical History:  Procedure Laterality Date  . CHOLECYSTECTOMY    . COLONOSCOPY N/A 01/17/2020   Procedure: COLONOSCOPY;  Surgeon: Regis Bill, MD;  Location: [facility] ENDOSCOPY;  Service: Endoscopy;  Laterality: N/A;  . COLONOSCOPY WITH PROPOFOL N/A 12/31/2016   Procedure: COLONOSCOPY WITH PROPOFOL;  Surgeon: Scot Jun, MD;  Location: [facility]  ENDOSCOPY;  Service: Endoscopy;  Laterality: N/A;  . ESOPHAGOGASTRODUODENOSCOPY (EGD) WITH PROPOFOL N/A 12/31/2016   Procedure: ESOPHAGOGASTRODUODENOSCOPY (EGD) WITH PROPOFOL;  Surgeon: Scot Jun, MD;  Location: [facility] ENDOSCOPY;  Service: Endoscopy;  Laterality: N/A;  . TONSILLECTOMY    . TUBAL LIGATION      Prior to Admission medications   Medication Sig Start Date End Date Taking? Authorizing Provider  apixaban (ELIQUIS) 5 MG TABS tablet Take 1 tablet (5 mg total) by mouth 2 (two) times daily. 03/20/19   Altamese Dilling, MD  Calcium Carb-Cholecalciferol (CALCIUM-VITAMIN D) 500-200 MG-UNIT tablet Take 1 tablet by mouth 2 (two) times daily.    [provider]  dicyclomine (BENTYL) 10 MG capsule Take 10 mg by mouth every 6 (six) hours as needed. 12/12/19   [provider]  diltiazem (CARDIZEM CD) 240 MG 24 hr capsule Take 1 capsule (240 mg total) by mouth daily. 03/21/19   Altamese Dilling, MD  folic acid (FOLVITE) 1 MG tablet Take 1 mg by mouth daily. Per tube once daily      [provider]  furosemide (LASIX) 20 MG tablet Take 20 mg by mouth daily. 03/05/20   [provider]  hydrocortisone (ANUSOL-HC) 2.5 % rectal cream Apply 1 application topically 2 (two) times daily as needed. 02/28/20   [provider]  levothyroxine (SYNTHROID) 75 MCG tablet Take 75 mcg by mouth daily before breakfast.     [provider]  metoprolol succinate (TOPROL-XL) 25 MG  24 hr tablet Take 25 mg by mouth daily. 03/05/19   [provider]  nitroGLYCERIN (NITROSTAT) 0.4 MG SL tablet Place 0.4 mg under the tongue every 5 (five) minutes as needed.  12/06/19 12/05/20  [provider]  NON FORMULARY Take 1 tablet by mouth daily.    [provider]  omeprazole (PRILOSEC) 20 MG capsule Take 20 mg by mouth daily.    [provider]  potassium chloride (KLOR-CON) 10 MEQ tablet Take 10 mEq by mouth daily.    [provider]     Allergies Prednisone and Predicort [prednisolone acetate]  Family History  Problem Relation Age of Onset  . Prostate cancer Father   . Breast cancer Maternal Aunt   . Breast cancer Maternal Aunt   . Cerebral palsy Son     Social History Social History   Tobacco Use  . Smoking status: Never Smoker  . Smokeless tobacco: Never Used  Vaping Use  . Vaping Use: Never used  Substance Use Topics  . Alcohol use: No  . Drug use: No    Review of Systems  Constitutional: No fever/chills Eyes: No visual changes.  ENT: No sore throat. Cardiovascular: Denies chest pain. Respiratory: Denies shortness of breath. Gastrointestinal: As above Genitourinary: Negative for dysuria. Musculoskeletal: Negative for back pain. Skin: Negative for rash. Neurological: Negative for headaches    ____________________________________________   PHYSICAL EXAM:  VITAL SIGNS: ED Triage Vitals  Enc Vitals Group     BP 03/11/20 1324 111/80     Pulse Rate 03/11/20 1324 90     Resp 03/11/20 1324 20     Temp 03/11/20 1324 98.3 F (36.8 C)     Temp Source 03/11/20 1324 Oral     SpO2 03/11/20 1324 97 %     Weight 03/11/20 1325 63.5 kg (140 lb)     Height 03/11/20 1325 1.651 m (5\' 5" )     Head Circumference --      Peak Flow --      Pain Score 03/11/20 1325 10     Pain Loc --      Pain Edu? --      Excl. in GC? --     Constitutional: Alert and oriented.  Eyes: Conjunctivae are normal.  Head: Atraumatic. Nose: No congestion/rhinnorhea. Mouth/Throat: Mucous membranes are moist.    Cardiovascular: Normal rate, regular rhythm. Grossly normal heart sounds.  Good peripheral circulation. Respiratory: Normal respiratory effort.  No retractions. Lungs CTAB. Gastrointestinal: Significantly distended abdomen, minimally tender Genitourinary: deferred Musculoskeletal: Warm and well perfused Neurologic:  Normal speech and language. No gross focal neurologic deficits are appreciated.   Skin:  Skin is warm, dry and intact. No rash noted. Psychiatric: Mood and affect are normal. Speech and behavior are normal.  ____________________________________________   LABS (all labs ordered are listed, but only abnormal results are displayed)  Labs Reviewed  COMPREHENSIVE METABOLIC PANEL - Abnormal; Notable for the following components:      Result Value   Sodium 126 (*)    Chloride 91 (*)    Glucose, Bld 125 (*)    Total Bilirubin 1.5 (*)    GFR, Estimated 56 (*)    All other components within normal limits  CBC - Abnormal; Notable for the following components:   WBC 13.3 (*)    RBC 5.84 (*)    Hemoglobin 15.3 (*)    HCT 46.3 (*)    MCV 79.3 (*)    RDW 18.7 (*)  All other components within normal limits  URINALYSIS, COMPLETE (UACMP) WITH MICROSCOPIC - Abnormal; Notable for the following components:   Color, Urine YELLOW (*)    APPearance CLEAR (*)    Specific Gravity, Urine >1.046 (*)    Hgb urine dipstick SMALL (*)    Ketones, ur 5 (*)    All other components within normal limits  RESPIRATORY PANEL BY RT PCR (FLU A&B, COVID)  LIPASE, BLOOD  LACTIC ACID, PLASMA  LACTIC ACID, PLASMA   ____________________________________________  EKG   ____________________________________________  RADIOLOGY  Abdomen xray reviewed by me, significant colonic distension CT abdomen pelvis reviewed by me, colonic distention noted, ____________________________________________   PROCEDURES  Procedure(s) performed: No  Procedures   Critical Care performed: No ____________________________________________   INITIAL IMPRESSION / ASSESSMENT AND PLAN / ED COURSE  Pertinent labs & imaging results that were available during my care of the patient were reviewed by me and considered in my medical decision making (see chart for details).  Patient presents with significant distention of the abdomen, minimally tender.  Has developed over the last several days.  Differential  includes severe constipation/ileus, colonic obstruction/mass, volvulus  We will give IV morphine, IV Zofran obtain CT abdomen pelvis.  Lab work notable for mildly elevated white blood cell count  Lactic acid normal  CT demonstrates colonic distention, possible stricture/hypertrophy  Discussed with Dr. Claudine Mouton of surgery, recommends Gastrografin enema  Discussed with Dr. Tobi Bastos who agrees with Gastrografin enema  Admitted to the hospitalist service, discussed with patient and her son [name] who would like to be kept informed    ____________________________________________   FINAL CLINICAL IMPRESSION(S) / ED DIAGNOSES  Final diagnoses:  Abdominal pain  Colon obstruction [facility])        Note:  This document was prepared using Dragon voice recognition software and may include unintentional dictation errors.   Jene Every, MD 03/11/20 2207

## 2020-03-11 NOTE — ED Notes (Signed)
Pt noted to have distended abdomen. Pt friend states that abdo girth is 6-8 inches above normal for her.

## 2020-03-11 NOTE — ED Triage Notes (Signed)
Pt presents to ED via POV with c/o abdominal pain after eating spicy jumbalaya last Sunday. Pt also c/o N/V at this time. Pt states was dx with diverticulitis and IBS a few months ago. Pt denies BM x 4-5 days, states has taken Milk of magnesia and Metamucil without relief of constipation. Pt states is currently wearing heart monitor due to recent increase of metoprolol for A-fib.

## 2020-03-11 NOTE — ED Notes (Signed)
Pt ambulated to toilet with assistance. 

## 2020-03-11 NOTE — ED Notes (Addendum)
Son Joseph's phone number [phone], call with updates

## 2020-03-11 NOTE — ED Notes (Addendum)
Pt in ED room. Pt friend at bedside, who is retired do. ED provider at bedside. Friend is telling EDP that he believes she has small bowel obstruction and is dehydrated. Also pt states that PCP recently increased metoprolol from 25mg  to 50mg /d. Pt being treated for diverticulitis. Pt has hx CHF and a fib and now has Holter monitor since about 1 week ago.

## 2020-03-12 ENCOUNTER — Encounter
Admission: EM | Disposition: A | Discharge status: Skilled Nursing Facility | Payer: Self-pay | Source: Home / Self Care | Attending: Hospitalist

## 2020-03-12 ENCOUNTER — Inpatient Hospital Stay: Payer: Medicare Other | Admitting: Registered Nurse

## 2020-03-12 ENCOUNTER — Encounter: Payer: Self-pay | Admitting: Family Medicine

## 2020-03-12 ENCOUNTER — Inpatient Hospital Stay: Payer: Medicare Other

## 2020-03-12 DIAGNOSIS — K56601 Complete intestinal obstruction, unspecified as to cause: Secondary | ICD-10-CM

## 2020-03-12 DIAGNOSIS — K56609 Unspecified intestinal obstruction, unspecified as to partial versus complete obstruction: Secondary | ICD-10-CM | POA: Diagnosis not present

## 2020-03-12 DIAGNOSIS — E871 Hypo-osmolality and hyponatremia: Secondary | ICD-10-CM | POA: Diagnosis not present

## 2020-03-12 DIAGNOSIS — R140 Abdominal distension (gaseous): Secondary | ICD-10-CM | POA: Diagnosis not present

## 2020-03-12 DIAGNOSIS — I5032 Chronic diastolic (congestive) heart failure: Secondary | ICD-10-CM | POA: Diagnosis not present

## 2020-03-12 DIAGNOSIS — I4891 Unspecified atrial fibrillation: Secondary | ICD-10-CM | POA: Diagnosis not present

## 2020-03-12 HISTORY — PX: LAPAROTOMY: SHX154

## 2020-03-12 HISTORY — PX: COLECTOMY WITH COLOSTOMY CREATION/HARTMANN PROCEDURE: SHX6598

## 2020-03-12 LAB — CBC
HCT: 41.1 % (ref 36.0–46.0)
Hemoglobin: 13.7 g/dL (ref 12.0–15.0)
MCH: 26.7 pg (ref 26.0–34.0)
MCHC: 33.3 g/dL (ref 30.0–36.0)
MCV: 80.1 fL (ref 80.0–100.0)
Platelets: 253 10*3/uL (ref 150–400)
RBC: 5.13 MIL/uL — ABNORMAL HIGH (ref 3.87–5.11)
RDW: 18.3 % — ABNORMAL HIGH (ref 11.5–15.5)
WBC: 15.8 10*3/uL — ABNORMAL HIGH (ref 4.0–10.5)
nRBC: 0 % (ref 0.0–0.2)

## 2020-03-12 LAB — LACTIC ACID, PLASMA: Lactic Acid, Venous: 1.1 mmol/L (ref 0.5–1.9)

## 2020-03-12 LAB — BASIC METABOLIC PANEL
Anion gap: 10 (ref 5–15)
BUN: 26 mg/dL — ABNORMAL HIGH (ref 8–23)
CO2: 19 mmol/L — ABNORMAL LOW (ref 22–32)
Calcium: 8 mg/dL — ABNORMAL LOW (ref 8.9–10.3)
Chloride: 99 mmol/L (ref 98–111)
Creatinine, Ser: 0.68 mg/dL (ref 0.44–1.00)
GFR, Estimated: >60 mL/min (ref >60)
Glucose, Bld: 109 mg/dL — ABNORMAL HIGH (ref 70–99)
Potassium: 4.2 mmol/L (ref 3.5–5.1)
Sodium: 128 mmol/L — ABNORMAL LOW (ref 135–145)

## 2020-03-12 LAB — RESPIRATORY PANEL BY RT PCR (FLU A&B, COVID)
Influenza A by PCR: NEGATIVE
Influenza B by PCR: NEGATIVE
SARS Coronavirus 2 by RT PCR: NEGATIVE

## 2020-03-12 SURGERY — LAPAROTOMY, EXPLORATORY
Anesthesia: General | Site: Abdomen

## 2020-03-12 MED ORDER — IOHEXOL 300 MG/ML  SOLN
150.0000 mL | Freq: Once | INTRAMUSCULAR | Status: AC | PRN
  Administered 2020-03-12: 150 mL

## 2020-03-12 MED ORDER — SODIUM CHLORIDE (PF) 0.9 % IJ SOLN
INTRAMUSCULAR | Status: AC
  Filled 2020-03-12: qty 10

## 2020-03-12 MED ORDER — SODIUM CHLORIDE 0.9 % IV SOLN: INTRAVENOUS | Status: AC

## 2020-03-12 MED ORDER — ROCURONIUM BROMIDE 10 MG/ML (PF) SYRINGE
PREFILLED_SYRINGE | INTRAVENOUS | Status: AC
  Filled 2020-03-12: qty 10

## 2020-03-12 MED ORDER — SODIUM CHLORIDE 0.9 % IV SOLN
INTRAVENOUS | Status: AC
  Filled 2020-03-12: qty 2

## 2020-03-12 MED ORDER — ALBUTEROL SULFATE HFA 108 (90 BASE) MCG/ACT IN AERS
INHALATION_SPRAY | RESPIRATORY_TRACT | Status: DC | PRN
  Administered 2020-03-12: 4 via RESPIRATORY_TRACT

## 2020-03-12 MED ORDER — LEVOTHYROXINE SODIUM 50 MCG PO TABS: 75.0000 ug | ORAL_TABLET | Freq: Every day | ORAL | Status: DC

## 2020-03-12 MED ORDER — SUGAMMADEX SODIUM 200 MG/2ML IV SOLN
INTRAVENOUS | Status: DC | PRN
  Administered 2020-03-12: 200 mg via INTRAVENOUS

## 2020-03-12 MED ORDER — KETAMINE HCL 50 MG/ML IJ SOLN
INTRAMUSCULAR | Status: AC
  Filled 2020-03-12: qty 10

## 2020-03-12 MED ORDER — MORPHINE SULFATE (PF) 2 MG/ML IV SOLN
1.0000 mg | INTRAVENOUS | Status: DC | PRN
  Administered 2020-03-12 – 2020-03-16 (×4): 1 mg via INTRAVENOUS
  Filled 2020-03-12 (×4): qty 1

## 2020-03-12 MED ORDER — ONDANSETRON HCL 4 MG/2ML IJ SOLN
INTRAMUSCULAR | Status: DC | PRN
  Administered 2020-03-12: 4 mg via INTRAVENOUS

## 2020-03-12 MED ORDER — METOPROLOL TARTRATE 5 MG/5ML IV SOLN
INTRAVENOUS | Status: AC
  Filled 2020-03-12: qty 5

## 2020-03-12 MED ORDER — ACETAMINOPHEN 10 MG/ML IV SOLN
1000.0000 mg | Freq: Four times a day (QID) | INTRAVENOUS | Status: AC
  Administered 2020-03-12 – 2020-03-13 (×3): 1000 mg via INTRAVENOUS
  Filled 2020-03-12 (×4): qty 100

## 2020-03-12 MED ORDER — ALBUMIN HUMAN 5 % IV SOLN: INTRAVENOUS | Status: DC | PRN

## 2020-03-12 MED ORDER — LACTATED RINGERS IV SOLN: INTRAVENOUS | Status: DC | PRN

## 2020-03-12 MED ORDER — PIPERACILLIN-TAZOBACTAM 3.375 G IVPB: 3.3750 g | Freq: Three times a day (TID) | INTRAVENOUS | Status: DC

## 2020-03-12 MED ORDER — METOPROLOL TARTRATE 5 MG/5ML IV SOLN
2.5000 mg | Freq: Once | INTRAVENOUS | Status: AC
  Administered 2020-03-12: 2.5 mg via INTRAVENOUS
  Filled 2020-03-12: qty 5

## 2020-03-12 MED ORDER — ONDANSETRON HCL 4 MG/2ML IJ SOLN: 4.0000 mg | Freq: Once | INTRAMUSCULAR | Status: DC | PRN

## 2020-03-12 MED ORDER — VASOPRESSIN 20 UNIT/ML IV SOLN
INTRAVENOUS | Status: DC | PRN
  Administered 2020-03-12 (×2): 2 [IU] via INTRAVENOUS
  Administered 2020-03-12 (×2): 1 [IU] via INTRAVENOUS

## 2020-03-12 MED ORDER — SODIUM CHLORIDE 0.9 % IV SOLN
2.0000 g | Freq: Two times a day (BID) | INTRAVENOUS | Status: AC
  Administered 2020-03-12: 2 g via INTRAVENOUS
  Filled 2020-03-12: qty 2

## 2020-03-12 MED ORDER — POTASSIUM CHLORIDE CRYS ER 10 MEQ PO TBCR
10.0000 meq | EXTENDED_RELEASE_TABLET | Freq: Every day | ORAL | Status: DC
  Administered 2020-03-13 – 2020-03-20 (×7): 10 meq via ORAL
  Filled 2020-03-12 (×8): qty 1

## 2020-03-12 MED ORDER — ESMOLOL HCL 100 MG/10ML IV SOLN
INTRAVENOUS | Status: DC | PRN
  Administered 2020-03-12: 20 mg via INTRAVENOUS

## 2020-03-12 MED ORDER — ALBUMIN HUMAN 5 % IV SOLN
INTRAVENOUS | Status: AC
  Filled 2020-03-12: qty 500

## 2020-03-12 MED ORDER — METOPROLOL TARTRATE 5 MG/5ML IV SOLN
INTRAVENOUS | Status: DC | PRN
  Administered 2020-03-12 (×2): 1 mg via INTRAVENOUS
  Administered 2020-03-12: 2 mg via INTRAVENOUS
  Administered 2020-03-12 (×4): 1 mg via INTRAVENOUS

## 2020-03-12 MED ORDER — FENTANYL CITRATE (PF) 100 MCG/2ML IJ SOLN
INTRAMUSCULAR | Status: AC
Start: 2020-03-12 — End: 2020-03-13
  Filled 2020-03-12: qty 2

## 2020-03-12 MED ORDER — SUCCINYLCHOLINE CHLORIDE 20 MG/ML IJ SOLN
INTRAMUSCULAR | Status: DC | PRN
  Administered 2020-03-12: 120 mg via INTRAVENOUS

## 2020-03-12 MED ORDER — TRAMADOL HCL 50 MG PO TABS
50.0000 mg | ORAL_TABLET | Freq: Four times a day (QID) | ORAL | Status: DC | PRN
  Administered 2020-03-12 – 2020-03-13 (×2): 50 mg via ORAL
  Filled 2020-03-12 (×2): qty 1

## 2020-03-12 MED ORDER — SODIUM CHLORIDE 0.9 % IV SOLN
INTRAVENOUS | Status: DC | PRN
  Administered 2020-03-12: 50 ug/min via INTRAVENOUS

## 2020-03-12 MED ORDER — ONDANSETRON HCL 4 MG/2ML IJ SOLN: 4.0000 mg | Freq: Four times a day (QID) | INTRAMUSCULAR | Status: DC | PRN

## 2020-03-12 MED ORDER — VASOPRESSIN 20 UNIT/ML IV SOLN
INTRAVENOUS | Status: AC
  Filled 2020-03-12: qty 1

## 2020-03-12 MED ORDER — LIDOCAINE HCL (PF) 2 % IJ SOLN
INTRAMUSCULAR | Status: DC | PRN
  Administered 2020-03-12: .5 mg/kg/h via INTRADERMAL

## 2020-03-12 MED ORDER — FENTANYL CITRATE (PF) 100 MCG/2ML IJ SOLN
25.0000 ug | INTRAMUSCULAR | Status: DC | PRN
  Administered 2020-03-12 (×4): 25 ug via INTRAVENOUS

## 2020-03-12 MED ORDER — FOLIC ACID 1 MG PO TABS
1.0000 mg | ORAL_TABLET | Freq: Every day | ORAL | Status: DC
  Administered 2020-03-13 – 2020-03-20 (×7): 1 mg via ORAL
  Filled 2020-03-12 (×8): qty 1

## 2020-03-12 MED ORDER — LEVOTHYROXINE SODIUM 50 MCG PO TABS
75.0000 ug | ORAL_TABLET | Freq: Every day | ORAL | Status: DC
  Administered 2020-03-13 – 2020-03-20 (×7): 75 ug via ORAL
  Filled 2020-03-12: qty 2
  Filled 2020-03-12 (×2): qty 1
  Filled 2020-03-12 (×4): qty 2

## 2020-03-12 MED ORDER — PHENYLEPHRINE HCL (PRESSORS) 10 MG/ML IV SOLN
INTRAVENOUS | Status: AC
  Filled 2020-03-12: qty 1

## 2020-03-12 MED ORDER — ALBUTEROL SULFATE HFA 108 (90 BASE) MCG/ACT IN AERS
INHALATION_SPRAY | RESPIRATORY_TRACT | Status: AC
  Filled 2020-03-12: qty 6.7

## 2020-03-12 MED ORDER — DEXMEDETOMIDINE (PRECEDEX) IN NS 20 MCG/5ML (4 MCG/ML) IV SYRINGE
PREFILLED_SYRINGE | INTRAVENOUS | Status: AC
  Filled 2020-03-12: qty 5

## 2020-03-12 MED ORDER — FENTANYL CITRATE (PF) 100 MCG/2ML IJ SOLN
INTRAMUSCULAR | Status: DC | PRN
Start: 2020-03-12 — End: 2020-03-12
  Administered 2020-03-12: 50 ug via INTRAVENOUS
  Administered 2020-03-12 (×2): 25 ug via INTRAVENOUS

## 2020-03-12 MED ORDER — PANTOPRAZOLE SODIUM 40 MG PO TBEC
40.0000 mg | DELAYED_RELEASE_TABLET | Freq: Every day | ORAL | Status: DC
  Administered 2020-03-13 – 2020-03-20 (×7): 40 mg via ORAL
  Filled 2020-03-12 (×8): qty 1

## 2020-03-12 MED ORDER — CHLORHEXIDINE GLUCONATE CLOTH 2 % EX PADS
6.0000 | MEDICATED_PAD | Freq: Every day | CUTANEOUS | Status: DC
  Administered 2020-03-12 – 2020-03-20 (×8): 6 via TOPICAL

## 2020-03-12 MED ORDER — METOPROLOL SUCCINATE ER 50 MG PO TB24
50.0000 mg | ORAL_TABLET | Freq: Every day | ORAL | Status: DC
  Administered 2020-03-13 – 2020-03-20 (×7): 50 mg via ORAL
  Filled 2020-03-12 (×8): qty 1

## 2020-03-12 MED ORDER — PHENYLEPHRINE HCL (PRESSORS) 10 MG/ML IV SOLN
INTRAVENOUS | Status: DC | PRN
  Administered 2020-03-12: 200 ug via INTRAVENOUS
  Administered 2020-03-12 (×5): 100 ug via INTRAVENOUS
  Administered 2020-03-12: 200 ug via INTRAVENOUS
  Administered 2020-03-12: 100 ug via INTRAVENOUS
  Administered 2020-03-12 (×2): 200 ug via INTRAVENOUS

## 2020-03-12 MED ORDER — PROPOFOL 10 MG/ML IV BOLUS
INTRAVENOUS | Status: AC
  Filled 2020-03-12: qty 20

## 2020-03-12 MED ORDER — KETAMINE HCL 10 MG/ML IJ SOLN
INTRAMUSCULAR | Status: DC | PRN
  Administered 2020-03-12 (×2): 20 mg via INTRAVENOUS
  Administered 2020-03-12: 10 mg via INTRAVENOUS

## 2020-03-12 MED ORDER — DILTIAZEM HCL ER COATED BEADS 120 MG PO CP24
240.0000 mg | ORAL_CAPSULE | Freq: Every day | ORAL | Status: DC
  Administered 2020-03-13 – 2020-03-20 (×7): 240 mg via ORAL
  Filled 2020-03-12: qty 1
  Filled 2020-03-12 (×8): qty 2

## 2020-03-12 MED ORDER — LIDOCAINE HCL (PF) 2 % IJ SOLN
INTRAMUSCULAR | Status: AC
  Filled 2020-03-12: qty 5

## 2020-03-12 MED ORDER — SUCCINYLCHOLINE CHLORIDE 200 MG/10ML IV SOSY
PREFILLED_SYRINGE | INTRAVENOUS | Status: AC
  Filled 2020-03-12: qty 10

## 2020-03-12 MED ORDER — LIDOCAINE HCL (CARDIAC) PF 100 MG/5ML IV SOSY
PREFILLED_SYRINGE | INTRAVENOUS | Status: DC | PRN
  Administered 2020-03-12 (×2): 50 mg via INTRAVENOUS

## 2020-03-12 MED ORDER — ACETAMINOPHEN 10 MG/ML IV SOLN
INTRAVENOUS | Status: AC
  Filled 2020-03-12: qty 100

## 2020-03-12 MED ORDER — ROCURONIUM BROMIDE 100 MG/10ML IV SOLN
INTRAVENOUS | Status: DC | PRN
  Administered 2020-03-12: 10 mg via INTRAVENOUS
  Administered 2020-03-12: 40 mg via INTRAVENOUS
  Administered 2020-03-12: 10 mg via INTRAVENOUS

## 2020-03-12 MED ORDER — ACETAMINOPHEN 10 MG/ML IV SOLN
INTRAVENOUS | Status: DC | PRN
  Administered 2020-03-12: 1000 mg via INTRAVENOUS

## 2020-03-12 MED ORDER — DEXMEDETOMIDINE HCL 200 MCG/2ML IV SOLN
INTRAVENOUS | Status: DC | PRN
  Administered 2020-03-12: 12 ug via INTRAVENOUS

## 2020-03-12 MED ORDER — SODIUM CHLORIDE 0.9 % IV SOLN: INTRAVENOUS | Status: DC | PRN

## 2020-03-12 MED ORDER — BUPIVACAINE-EPINEPHRINE (PF) 0.25% -1:200000 IJ SOLN
INTRAMUSCULAR | Status: AC
  Filled 2020-03-12: qty 30

## 2020-03-12 MED ORDER — SODIUM CHLORIDE 0.9 % IV SOLN
2.0000 g | Freq: Once | INTRAVENOUS | Status: AC
  Administered 2020-03-12: 2 g via INTRAVENOUS

## 2020-03-12 MED ORDER — ONDANSETRON 4 MG PO TBDP
4.0000 mg | ORAL_TABLET | Freq: Four times a day (QID) | ORAL | Status: DC | PRN
  Administered 2020-03-17 – 2020-03-18 (×3): 4 mg via ORAL
  Filled 2020-03-12 (×3): qty 1

## 2020-03-12 MED ORDER — APIXABAN 5 MG PO TABS: 5.0000 mg | ORAL_TABLET | Freq: Two times a day (BID) | ORAL | Status: DC

## 2020-03-12 MED ORDER — FENTANYL CITRATE (PF) 100 MCG/2ML IJ SOLN
INTRAMUSCULAR | Status: AC
  Filled 2020-03-12: qty 2

## 2020-03-12 MED ORDER — SEVOFLURANE IN SOLN
RESPIRATORY_TRACT | Status: AC
  Filled 2020-03-12: qty 250

## 2020-03-12 MED ORDER — PROPOFOL 10 MG/ML IV BOLUS
INTRAVENOUS | Status: DC | PRN
  Administered 2020-03-12: 120 mg via INTRAVENOUS

## 2020-03-12 MED ORDER — LACTATED RINGERS IV BOLUS
500.0000 mL | Freq: Once | INTRAVENOUS | Status: AC
  Administered 2020-03-12: 500 mL via INTRAVENOUS

## 2020-03-12 SURGICAL SUPPLY — 54 items
APPLIER CLIP 11 MED OPEN (CLIP)
APPLIER CLIP 13 LRG OPEN (CLIP)
BLADE CLIPPER SURG (BLADE) IMPLANT
BLADE SURG 15 STRL LF DISP TIS (BLADE) ×2 IMPLANT
BLADE SURG 15 STRL SS (BLADE) ×4
CANISTER SUCT 1200ML W/VALVE (MISCELLANEOUS) ×4 IMPLANT
CANISTER SUCT 3000ML PPV (MISCELLANEOUS) ×4 IMPLANT
CHLORAPREP W/TINT 26 (MISCELLANEOUS) ×4 IMPLANT
CLIP APPLIE 11 MED OPEN (CLIP) IMPLANT
CLIP APPLIE 13 LRG OPEN (CLIP) IMPLANT
COVER BACK TABLE REUSABLE LG (DRAPES) ×4 IMPLANT
COVER WAND RF STERILE (DRAPES) ×4 IMPLANT
DRAIN PENROSE 1/4X12 LTX STRL (WOUND CARE) ×4 IMPLANT
DRAPE C-SECTION (MISCELLANEOUS) ×4 IMPLANT
DRAPE INCISE IOBAN 66X45 STRL (DRAPES) IMPLANT
DRAPE LAPAROTOMY 100X77 ABD (DRAPES) IMPLANT
DRSG OPSITE POSTOP 4X14 (GAUZE/BANDAGES/DRESSINGS) ×4 IMPLANT
ELECT BLADE 6.5 EXT (BLADE) ×4 IMPLANT
ELECT CAUTERY BLADE 6.4 (BLADE) ×4 IMPLANT
ELECT REM PT RETURN 9FT ADLT (ELECTROSURGICAL) ×4
ELECTRODE REM PT RTRN 9FT ADLT (ELECTROSURGICAL) ×2 IMPLANT
GLOVE ORTHO TXT STRL SZ7.5 (GLOVE) ×8 IMPLANT
GOWN STRL REUS W/ TWL LRG LVL3 (GOWN DISPOSABLE) ×6 IMPLANT
GOWN STRL REUS W/TWL LRG LVL3 (GOWN DISPOSABLE) ×12
HANDLE YANKAUER SUCT BULB TIP (MISCELLANEOUS) IMPLANT
KIT OSTOMY 2 PC DRNBL 2.25 STR (WOUND CARE) IMPLANT
KIT OSTOMY DRAINABLE 2.25 STR (WOUND CARE)
KIT OSTOMY DRAINABLE 2.75 STR (WOUND CARE) IMPLANT
KIT TURNOVER KIT A (KITS) ×4 IMPLANT
LABEL OR SOLS (LABEL) ×4 IMPLANT
LIGASURE IMPACT 36 18CM CVD LR (INSTRUMENTS) ×4 IMPLANT
LOOP OSTOMY BRIDGE (OSTOMY) IMPLANT
NEEDLE HYPO 22GX1.5 SAFETY (NEEDLE) ×4 IMPLANT
NS IRRIG 1000ML POUR BTL (IV SOLUTION) ×4 IMPLANT
PACK BASIN MAJOR ARMC (MISCELLANEOUS) ×4 IMPLANT
PACK COLON CLEAN CLOSURE (MISCELLANEOUS) IMPLANT
RELOAD PROXIMATE 75MM BLUE (ENDOMECHANICALS) ×4 IMPLANT
SHEARS HARMONIC STRL 23CM (MISCELLANEOUS) IMPLANT
SPONGE LAP 18X18 RF (DISPOSABLE) ×4 IMPLANT
STAPLER GUN LINEAR PROX 60 (STAPLE) ×4 IMPLANT
STAPLER PROXIMATE 75MM BLUE (STAPLE) ×4 IMPLANT
STAPLER SKIN PROX 35W (STAPLE) ×4 IMPLANT
SUT PDS AB 0 CT1 27 (SUTURE) ×8 IMPLANT
SUT PDS AB 1 TP1 96 (SUTURE) IMPLANT
SUT SILK 2 0 (SUTURE) ×3
SUT SILK 2-0 18XBRD TIE 12 (SUTURE) ×2 IMPLANT
SUT SILK 3-0 (SUTURE) ×4 IMPLANT
SUT VIC AB 3-0 SH 27 (SUTURE) ×6
SUT VIC AB 3-0 SH 27X BRD (SUTURE) ×4 IMPLANT
SUT VICRYL 2-0 54IN ABS (SUTURE) IMPLANT
SYR 10ML LL (SYRINGE) ×4 IMPLANT
SYR TOOMEY IRRIG 70ML (MISCELLANEOUS) ×4
SYRINGE TOOMEY IRRIG 70ML (MISCELLANEOUS) ×2 IMPLANT
TRAY FOLEY MTR SLVR 16FR STAT (SET/KITS/TRAYS/PACK) ×4 IMPLANT

## 2020-03-12 NOTE — Progress Notes (Signed)
This note also relates to the following rows which could not be included: ECG Heart Rate - Cannot attach notes to unvalidated device data Resp - Cannot attach notes to unvalidated device data    03/12/20 2300  Assess: MEWS Score  Temp (!) 97.5 F (36.4 C)  BP 110/89  Pulse Rate (!) 119  Assess: MEWS Score  MEWS Temp 0  MEWS Systolic 0  MEWS Pulse 2  MEWS RR 0  MEWS LOC 0  MEWS Score 2  MEWS Score Color Yellow  Assess: if the MEWS score is Yellow or Red  Were vital signs taken at a resting state? Yes  Focused Assessment No change from prior assessment  Early Detection of Sepsis Score *See Row Information* Medium  MEWS guidelines implemented *See Row Information* No, previously yellow, continue vital signs every 4 hours

## 2020-03-12 NOTE — Progress Notes (Signed)
°   03/12/20 2003  Assess: MEWS Score  Temp (!) 97.5 F (36.4 C)  BP 104/81  Pulse Rate (!) 114  Resp (!) 24  SpO2 100 %  Assess: MEWS Score  MEWS Temp 0  MEWS Systolic 0  MEWS Pulse 2  MEWS RR 1  MEWS LOC 0  MEWS Score 3  MEWS Score Color Yellow  Assess: if the MEWS score is Yellow or Red  Were vital signs taken at a resting state? Yes  Focused Assessment No change from prior assessment  Early Detection of Sepsis Score *See Row Information* Medium  MEWS guidelines implemented *See Row Information* No, vital signs rechecked  Document  Progress note created (see row info) Yes  Vital signs retaken

## 2020-03-12 NOTE — ED Notes (Signed)
Purple DNR bracelet placed on right wrist 

## 2020-03-12 NOTE — ED Notes (Signed)
Received verbal consent via telephone from son [name] to proceed with surgery.

## 2020-03-12 NOTE — Anesthesia Preprocedure Evaluation (Signed)
Anesthesia Evaluation  Patient identified by MRN, date of birth, ID band Patient awake    Reviewed: Allergy & Precautions, H&P , NPO status , Patient's Chart, lab work & pertinent test results, reviewed documented beta blocker date and time   Airway Mallampati: II  TM Distance: >3 FB Neck ROM: full    Dental  (+) Teeth Intact   Pulmonary pneumonia, resolved,    Pulmonary exam normal        Cardiovascular Exercise Tolerance: Good hypertension, On Medications +CHF  Normal cardiovascular exam Rhythm:regular Rate:Normal     Neuro/Psych Seizures -,  CVA negative psych ROS   GI/Hepatic negative GI ROS, Neg liver ROS,   Endo/Other  Hypothyroidism   Renal/GU negative Renal ROS  negative genitourinary   Musculoskeletal   Abdominal   Peds  Hematology  (+) Blood dyscrasia, anemia ,   Anesthesia Other Findings Past Medical History: No date: Acute respiratory failure (HCC)     Comment:  Secondary to aspiration pneumonia  No date: Altered mental status     Comment:  Secondary to viral herpes simplex virus encephalitis No date: Anemia No date: Atrial fibrillation (HCC) No date: Bilateral pneumonia No date: CHF (congestive heart failure) (HCC) No date: Dysphasia No date: Encephalitis No date: Hyperglycemia No date: Hypertension No date: Hyponatremia No date: IBS (irritable bowel syndrome) No date: Seizures (HCC) No date: Stroke [facility]) Past Surgical History: No date: CHOLECYSTECTOMY 01/17/2020: COLONOSCOPY; N/A     Comment:  Procedure: COLONOSCOPY;  Surgeon: Regis Bill,               MD;  Location: [facility] ENDOSCOPY;  Service: Endoscopy;                Laterality: N/A; 12/31/2016: COLONOSCOPY WITH PROPOFOL; N/A     Comment:  Procedure: COLONOSCOPY WITH PROPOFOL;  Surgeon: Scot Jun, MD;  Location: [facility] ENDOSCOPY;  Service:               Endoscopy;  Laterality: N/A; 12/31/2016:  ESOPHAGOGASTRODUODENOSCOPY (EGD) WITH PROPOFOL; N/A     Comment:  Procedure: ESOPHAGOGASTRODUODENOSCOPY (EGD) WITH               PROPOFOL;  Surgeon: Scot Jun, MD;  Location:               [facility] ENDOSCOPY;  Service: Endoscopy;  Laterality: N/A; No date: TONSILLECTOMY No date: TUBAL LIGATION BMI    Body Mass Index: 23.30 kg/m     Reproductive/Obstetrics negative OB ROS                             Anesthesia Physical Anesthesia Plan  ASA: III  Anesthesia Plan: General ETT   Post-op Pain Management:    Induction:   PONV Risk Score and Plan:   Airway Management Planned:   Additional Equipment:   Intra-op Plan:   Post-operative Plan:   Informed Consent: I have reviewed the patients History and Physical, chart, labs and discussed the procedure including the risks, benefits and alternatives for the proposed anesthesia with the patient or authorized representative who has indicated his/her understanding and acceptance.     Dental Advisory Given  Plan Discussed with: CRNA  Anesthesia Plan Comments:         Anesthesia Quick Evaluation

## 2020-03-12 NOTE — Progress Notes (Addendum)
PROGRESS NOTE    [name]   IWL:[id number]  DOB: 04/06/1946  PCP: Mickel Fuchs, MD    DOA: 03/11/2020 LOS: 1   Brief Narrative   [name] is a 74 y.o. female with medical history significant for chronic dCHF, A-fib on Eliquis, history of CVA, hypothyroidism, iron deficiency anemia, history of diverticulitis who presented to the ED on 03/11/20 with nausea, vomiting and abdominal pain and worsening abdominal distention, and no bowel movement in 5 days.  Onset was in the setting of a spicy meal she'd eaten four days prior and subsequently had few days of nausea/vomiting.    She recently had diverticulitis and underwent a colonoscopy in August due to concerns of possible sigmoid obstruction.  Colonoscopy showed only significant sigmoid edema at that time.  In the ED, afebrile, tachycardic and tachypneic.  Labs significant for leukocytosis of 13.3, sodium 126.  CT abdomen pelvis show moderate diffuse colonic distention with a degree of obstruction at the sigmoid colon possibly secondary to muscular hypertrophy versus stricture.  No active diverticulitis.  General surgery consulted.  On 10/11, patient had Gastrografin enema which showed complete colonic obstruction at the level of the mid to distal sigmoid colon.  Patient was taken to surgery, underwent subtotal colectomy.     Assessment & Plan   Principal Problem:   Large bowel obstruction (HCC) Active Problems:   Atrial fibrillation (HCC)   Abdominal distention   Hyponatremia   Chronic diastolic heart failure (HCC)   Hypothyroidism   Large Bowel Obstruction - present on admission with 5 days constipation, diffuse abdominal pain, worsening distention and obstruction confirmed by Gastrografin enema on 10/11.  Underwent subtotal colectomy on 10/11 with Dr. Toula Moos. --Surgery following --NPO, diet per surgery --IV fluids --Zosyn per surgery --monitor abdominal exam closely --hold Eliquis, SCD's for VTE ppx for  now  Hypotension - post-operatively, suspect due to anesthesia medications, hypovolemia.  Getting fluid bolus in PACU. --Maintain MAP>=65 with IV fluids --If worsening or persistent despite fluids >> ICU for vasopressors  Hyponatremia - secondary to hypovolemia from decrease PO intake.  Improving with IV fluids. Na 126 on admission >> 128.   --Follow BMP --Continue IV fluids  Atrial fibrillation - A fib with RVR in post-op setting - expect rate to improve with hydration and improved BP, resumption of PO medications.   --Hold Eliquis --Continue PO Cardizem and metoprolol --PRN IV Lopressor for sustained HR's > 110 bpm for now --Consider Cardizem infusion and cardiology consult if RVR persists  Chronic diastolic heart failure - compensated on admission, euvolemic.   --Hold Lasix given hyponatremia.   --Monitor volume status closely with IV fluids.    Hypothyroidism - continue levothyroxine    DVT prophylaxis: Place and maintain sequential compression device Start: 03/12/20 0202   Diet:  Diet Orders (From admission, onward)    Start     Ordered   03/11/20 2236  Diet NPO time specified Except for: Sips with Meds  Diet effective now       Question:  Except for  Answer:  [name] with Meds   03/11/20 2235            Code Status: DNR    Subjective 03/12/20    Pt seen in PACU after surgery this afternoon.  She reports being dizzy, and back pain, asking for a pillow under her back.  Laying flat due to soft BP.  Denies other acute complaints.  No family at bedside in PACU.   Disposition  Plan & Communication   Status is: Inpatient  Remains inpatient appropriate because:IV treatments appropriate due to intensity of illness or inability to take PO   Dispo: The patient is from: Home              Anticipated d/c is to: Home              Anticipated d/c date is: 3 days              Patient currently is not medically stable to d/c.        Family Communication: none at  bedside, will attempt to call.  Family has been updated by surgery team.    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  Antimicrobials:  Anti-infectives (From admission, onward)   Start     Dose/Rate Route Frequency Ordered Stop   03/12/20 1200  cefoTEtan (CEFOTAN) 2 g in sodium chloride 0.9 % 100 mL IVPB        2 g 200 mL/hr over 30 Minutes Intravenous  Once 03/12/20 1131 03/12/20 1228   03/12/20 1130  sodium chloride 0.9 % with cefoTEtan (CEFOTAN) ADS Med       Note to Pharmacy: Mikey Bussing   : cabinet override      03/12/20 1130 03/12/20 1200   03/12/20 0945  [MAR Hold]  piperacillin-tazobactam (ZOSYN) IVPB 3.375 g        (MAR Hold since Mon 03/12/2020 at 1106.Hold Reason: Transfer to a Procedural area.)   3.375 g 12.5 mL/hr over 240 Minutes Intravenous Every 8 hours 03/12/20 0940          Objective   Vitals:   03/12/20 1505 03/12/20 1507 03/12/20 1518 03/12/20 1520  BP:  90/65 96/74   Pulse: (!) 104 80 (!) 108 (!) 115  Resp: (!) Temp:      TempSrc:      SpO2: 97% 97% 97% 97%  Weight:      Height:        Intake/Output Summary (Last 24 hours) at 03/12/2020 1532 Last data filed at 03/12/2020 1450 Gross per 24 hour  Intake 3998 ml  Output 400 ml  Net 3598 ml   Filed Weights   03/11/20 1325 03/12/20 1109  Weight: 63.5 kg 63.5 kg    Physical Exam:  General exam: awake, alert, no acute distress HEENT: pale and dry mucus membranes, hearing grossly normal  Respiratory system: CTAB, no wheezes, rales or rhonchi, normal respiratory effort. Cardiovascular system: normal S1/S2, irregularly irregular, no pedal edema.   Gastrointestinal system: surgical honeycomb dressing and incision site intact, not palpated for tenderness just out of surgery Central nervous system: A&O x3. no gross focal neurologic deficits, normal speech Extremities: moves all, no edema, SCD's on  LE's Psychiatry: normal mood, congruent affect  Labs   Data Reviewed: I have personally reviewed following labs and imaging studies  CBC: Recent Labs  Lab 03/11/20 1335 03/12/20 0445  WBC 13.3* 15.8*  HGB 15.3* 13.7  HCT 46.3* 41.1  MCV 79.3* 80.1  PLT 290 253   Basic Metabolic Panel: Recent Labs  Lab 03/11/20 1335 03/12/20 0445  NA 126* 128*  K 4.6 4.2  CL 91* 99  CO2 22 19*  GLUCOSE 125* 109*  BUN 22 26*  CREATININE 0.98 0.68  CALCIUM 9.2 8.0*   GFR: Estimated Creatinine Clearance: 53.8 mL/min (by C-G formula based on SCr of 0.68 mg/dL).  Liver Function Tests: Recent Labs  Lab 03/11/20 1335  AST 30  ALT 20  ALKPHOS 83  BILITOT 1.5*  PROT 8.1  ALBUMIN 4.1   Recent Labs  Lab 03/11/20 1335  LIPASE 27   No results for input(s): AMMONIA in the last 168 hours. Coagulation Profile: No results for input(s): INR, PROTIME in the last 168 hours. Cardiac Enzymes: No results for input(s): CKTOTAL, CKMB, CKMBINDEX, TROPONINI in the last 168 hours. BNP (last 3 results) No results for input(s): PROBNP in the last 8760 hours. HbA1C: No results for input(s): HGBA1C in the last 72 hours. CBG: No results for input(s): GLUCAP in the last 168 hours. Lipid Profile: No results for input(s): CHOL, HDL, LDLCALC, TRIG, CHOLHDL, LDLDIRECT in the last 72 hours. Thyroid Function Tests: No results for input(s): TSH, T4TOTAL, FREET4, T3FREE, THYROIDAB in the last 72 hours. Anemia Panel: No results for input(s): VITAMINB12, FOLATE, FERRITIN, TIBC, IRON, RETICCTPCT in the last 72 hours. Sepsis Labs: Recent Labs  Lab 03/11/20 1957 03/12/20 0445  LATICACIDVEN 1.4 1.1    Recent Results (from the past 240 hour(s))  Respiratory Panel by RT PCR (Flu A&B, Covid) - Nasopharyngeal Swab     Status: None   Collection Time: 03/12/20  1:58 AM   Specimen: Nasopharyngeal Swab  Result Value Ref Range Status   SARS Coronavirus 2 by RT PCR NEGATIVE NEGATIVE Final    Comment:  (NOTE) SARS-CoV-2 target nucleic acids are NOT DETECTED.  The SARS-CoV-2 RNA is generally detectable in upper respiratoy specimens during the acute phase of infection. The lowest concentration of SARS-CoV-2 viral copies this assay can detect is 131 copies/mL. A negative result does not preclude SARS-Cov-2 infection and should not be used as the sole basis for treatment or other patient management decisions. A negative result may occur with  improper specimen collection/handling, submission of specimen other than nasopharyngeal swab, presence of viral mutation(s) within the areas targeted by this assay, and inadequate number of viral copies (<131 copies/mL). A negative result must be combined with clinical observations, patient history, and epidemiological information. The expected result is Negative.  Fact Sheet for Patients:  https://www.moore.com/  Fact Sheet for Healthcare Providers:  https://www.young.biz/  This test is no t yet approved or cleared by the [place] FDA and  has been authorized for detection and/or diagnosis of SARS-CoV-2 by FDA under an Emergency Use Authorization (EUA). This EUA will remain  in effect (meaning this test can be used) for the duration of the COVID-19 declaration under Section 564(b)(1) of the Act, 21 U.S.C. section 360bbb-3(b)(1), unless the authorization is terminated or revoked sooner.     Influenza A by PCR NEGATIVE NEGATIVE Final   Influenza B by PCR NEGATIVE NEGATIVE Final    Comment: (NOTE) The Xpert Xpress SARS-CoV-2/FLU/RSV assay is intended as an aid in  the diagnosis of influenza from Nasopharyngeal swab specimens and  should not be used as a sole basis for treatment. Nasal washings and  aspirates are unacceptable for Xpert Xpress SARS-CoV-2/FLU/RSV  testing.  Fact Sheet for Patients: https://www.moore.com/  Fact Sheet for Healthcare  Providers: https://www.young.biz/  This test is not yet approved or cleared by the [place] FDA and  has been authorized for detection and/or diagnosis of SARS-CoV-2 by  FDA under an Emergency Use Authorization (EUA). This EUA will remain  in effect (meaning this test can be used) for the duration of the  Covid-19 declaration under Section 564(b)(1) of the Act, 21  U.S.C. section 360bbb-3(b)(1), unless the authorization  is  terminated or revoked. Performed at [facility], [address] [place], [place] [address]       Imaging Studies   CT ABDOMEN PELVIS W CONTRAST  Result Date: 03/11/2020 CLINICAL DATA:  74 year old female with abdominal distension. EXAM: CT ABDOMEN AND PELVIS WITH CONTRAST TECHNIQUE: Multidetector CT imaging of the abdomen and pelvis was performed using the standard protocol following bolus administration of intravenous contrast. CONTRAST:  100mL OMNIPAQUE IOHEXOL 300 MG/ML  SOLN COMPARISON:  CT abdomen pelvis dated 12/21/2019. FINDINGS: Lower chest: Trace left and small right pleural effusions. Patchy right lung base consolidation, likely atelectasis. Pneumonia is not excluded clinical correlation is recommended. There is moderate cardiomegaly with biatrial dilatation. There is retrograde flow of contrast from the right atrium into the IVC suggestive of a degree of right heart dysfunction. There is calcification of the mitral annulus. No intra-abdominal free air. Trace free fluid in the pelvis. Hepatobiliary: There is mild irregularity of the liver contour suggestive of early changes of cirrhosis. Bilobed or 2 adjacent cysts in the left lobe of the liver with combined dimension of 2 cm. No intrahepatic biliary ductal dilatation. Cholecystectomy. No retained calcified stone noted in the central CBD. Pancreas: No acute findings. Spleen: Normal in size without focal abnormality. Adrenals/Urinary Tract: The adrenal glands unremarkable. Mild  bilateral parenchyma atrophy. There is no hydronephrosis on either side. There is symmetric enhancement and excretion of contrast by both kidneys. The visualized ureters and urinary bladder appear unremarkable. Stomach/Bowel: There is sigmoid diverticulosis with muscular hypertrophy. There is moderate diffuse colonic distension with air extending to the level of the sigmoid colon suggestive of a degree of obstruction of the sigmoid colon, likely related to muscular hypertrophy or stricture. Underlying mass is not excluded. Clinical correlation and follow-up recommended. Scattered tiny pockets of air along the wall of the cecum and proximal colon likely mixed with the colonic content and less likely pneumatosis. Correlation with lactic acid recommended to exclude bowel ischemia. There is a small hiatal hernia. No evidence of small-bowel obstruction. Vascular/Lymphatic: Mild aortoiliac atherosclerotic disease. The IVC is unremarkable. No portal venous gas. There is no adenopathy. Reproductive: The uterus is grossly unremarkable. Other: Small fat containing umbilical hernia. Musculoskeletal: No acute or significant osseous findings. IMPRESSION: 1. Moderate diffuse colonic distension likely related to a degree of obstruction of the sigmoid colon, secondary to muscular hypertrophy or stricture. Underlying mass is not excluded. Clinical correlation and follow-up recommended. 2. Sigmoid diverticulosis with muscular hypertrophy. No active inflammatory changes. 3. Moderate cardiomegaly with biatrial dilatation. 4. Trace left and small right pleural effusions. Patchy right lung base consolidation, likely atelectasis. Pneumonia is not excluded. 5. Aortic Atherosclerosis (ICD10-I70.0). Electronically Signed   By: Elgie CollardArash  Radparvar M.D.   On: 03/11/2020 19:45   DG Abd 2 Views  Result Date: 03/11/2020 CLINICAL DATA:  Abdominal pain EXAM: ABDOMEN - 2 VIEW COMPARISON:  December 12, 2019 FINDINGS: There is marked gaseous dilation of  loops colon. The cecum measures approximately 13 cm. No definitive free air. There are nonspecific air-fluid levels on upright radiographs. Small amount of air seen within the rectum. Bibasilar heterogeneous opacities, nonspecific. Surgical clips project over the upper abdomen. Degenerative changes of the lower lumbar spine. IMPRESSION: 1. Marked gaseous dilation of loops of colon. Small amount of air seen within the rectum. Findings are concerning for colonic obstruction versus ileus. Recommend further evaluation with dedicated contrast enhanced CT. 2.  Bibasilar heterogeneous opacities, nonspecific. Electronically Signed   By: Meda KlinefelterStephanie  Peacock MD  On: 03/11/2020 15:37   DG BE (COLON)W SINGLE CM (SOL OR THIN BA)  Result Date: 03/12/2020 CLINICAL DATA:  Evaluate for colonic obstruction EXAM: SINGLE CONTRAST ENEMA TECHNIQUE: Initial scout AP supine abdominal image obtained. Contrast was introduced into the colon in a retrograde fashion and spot images were obtained. Dilute Omnipaque 300 contrast media was used. FLUOROSCOPY TIME:  Fluoroscopy Time:  1.2 minutes Radiation Exposure Index (if provided by the fluoroscopic device): 28.4 mGy Number of Acquired Spot Images: 12 COMPARISON:  CT 03/11/2020 FINDINGS: Single contrast enema was performed utilizing dilute Omnipaque 300. Contrast filled the rectum and distal sigmoid colon. There is abrupt luminal obstruction the level of the mid to distal sigmoid colon corresponding to site of high-grade luminal narrowing seen on CT. No contrast was able to traverse this level. No extraluminal contrast was visualized. Patient tolerated the procedure well. IMPRESSION: Complete colonic obstruction at the level of the mid to distal sigmoid colon. These results were called by telephone at the conclusion of the exam on 03/12/2020 at 10:53 a.m. to provider Lynden Oxford, PA, who verbally acknowledged these results. Electronically Signed   By: Duanne Guess D.O.   On:  03/12/2020 11:39     Medications   Scheduled Meds: . [MAR Hold] diltiazem  240 mg Oral Daily  . fentaNYL      . [MAR Hold] folic acid  1 mg Oral Daily  . [MAR Hold] levothyroxine  75 mcg Oral QAC breakfast  . [MAR Hold] metoprolol succinate  50 mg Oral Daily  . [MAR Hold] pantoprazole  40 mg Oral Daily  . [MAR Hold] potassium chloride  10 mEq Oral Daily   Continuous Infusions: . [MAR Hold] piperacillin-tazobactam (ZOSYN)  IV         LOS: 1 day    Time spent: 30 minutes    Pennie Banter, DO [facility]  03/12/2020, 3:32 PM    If 7PM-7AM, please contact night-coverage. How to contact the [facility] Attending or Consulting provider 7A - 7P or covering provider during after hours 7P -7A, for this patient?    1. Check the care team in [facility] and look for a) attending/consulting [facility] provider listed and b) the [facility] team listed 2. Log into [email or web address] and use [place]'s universal password to access. If you do not have the password, please contact the hospital operator. 3. Locate the [facility] provider you are looking for under [facility] and page to a number that you can be directly reached. 4. If you still have difficulty reaching the provider, please page the [facility] (Director on Call) for the Hospitalists listed on amion for assistance.

## 2020-03-12 NOTE — Progress Notes (Signed)
Dr Claudine Mouton was notified that the patient was having bleeding that was oozing from the ostomy wafer. I was not sure where the bleeding was coming from. Okay given for nursing to change the ostomy appliance. After changing the ostomy appliance it was noted that the bleeding was coming from around the base of the stoma. There was a moderate amount of congealed blood around the stoma. I did report these findings to Dr Everlene Farrier who is the surgeon on call

## 2020-03-12 NOTE — Consult Note (Addendum)
WOC Nurse ostomy consult note Consult requested for new ileostomy; performed today, and pt is still in the perioperative setting.  WOC will perform a pouch change and teaching session tomorrow. Cammie Mcgee MSN, RN, CWOCN, [place], CNS [phone]

## 2020-03-12 NOTE — Consult Note (Addendum)
ADDENDUM 11:04 AM: Reviewed BE with radiologist which is concerning for 100% obstruction of the distal colon. I discussed these findings with the patient and her son Jomarie LongsJoseph on the phone and the concern for impending perforation and the potential complications of this. I reviewed my recommendation for exploratory laparotomy and likely ostomy creation to relieve the obstruction. The risk, benefits, and alternatives to these were extensively discussed including, but not limited to, infection, blood lose, need for intubation post-op, and even death. They were understanding that forgoing surgery may lead to colonic perforation and likely death. At this time, they are willing to proceed with surgery. Consent obtained.    [facility] SURGICAL CONSULTATION NOTE (initial) - cpt: 99244   HISTORY OF PRESENT ILLNESS (HPI):  74 y.o. female presented [facility] ED yesterday (10/10) for evaluation of abdominal distension. Patient reports around a 4 day history of lower abdominal discomfort ad progressive distension. She thought this was initially attributed to something in her diet however the symptom persisted longer than anticipated. She endorses associated nausea, emesis, and constipation as well. Last BM was reportedly 5 days ago. She tried milk of magnesia and Miralax without any relief. No fever, chills, cough, SOB, CP, urinary changes. She does have a history of diverticulitis and underwent colonoscopy in August of this year with Dr Mia CreekLocklear, MD. This showed diverticulosis of the sigmoid colon and significant edema leading to narrowing. Biopsies at that time proved to be mild colitis without evidence of malignancy. Only other previous abdominal surgery is a cholecystectomy. She does not appear to take any chronic narcotic pain medications. Initial lab work up in the ED was concerning for mild leukocytosis to 13.3K this morning (now 15.8 this morning), renal function was normal, mild hyperbilirubinemia  to 1.5. She underwent CT Abdomen/Pelvis which demonstrated colonic distension concerning for possible obstructive process at the level of the sigmoid colon. She was admitted to medicine service with GI and surgical consultation  Surgery is consulted by emergency medicine physician Dr. Jene Everyobert Kinner, MD in this context for evaluation and management of colonic distension of unclear etiology.   PAST MEDICAL HISTORY (PMH):  Past Medical History:  Diagnosis Date  . Acute respiratory failure (HCC)    Secondary to aspiration pneumonia   . Altered mental status    Secondary to viral herpes simplex virus encephalitis  . Anemia   . Atrial fibrillation (HCC)   . Bilateral pneumonia   . CHF (congestive heart failure) (HCC)   . Dysphasia   . Encephalitis   . Hyperglycemia   . Hypertension   . Hyponatremia   . IBS (irritable bowel syndrome)   . Seizures (HCC)   . Stroke [facility](HCC)      PAST SURGICAL HISTORY (PSH):  Past Surgical History:  Procedure Laterality Date  . CHOLECYSTECTOMY    . COLONOSCOPY N/A 01/17/2020   Procedure: COLONOSCOPY;  Surgeon: Regis BillLocklear, Cameron T, MD;  [facility] END[facility]COPY;  Service: Endoscopy;  Laterality: N/A;  . COLONOSCOPY WITH PROPOFOL N/A 12/31/2016   Procedure: COLONOSCOPY WITH PROPOFOL;  Surgeon: Scot JunElliott, Robert T, MD;  [facility];  Service: Endoscopy;  Laterality: N/A;  . ESOPHAGOGASTRODUODENOSCOPY (EGD) WITH PROPOFOL N/A 12/31/2016   Procedure: ESOPHAGOGASTRODUODENOSCOPY (EGD) WITH PROPOFOL;  Surgeon: Scot JunElliott, Robert T, MD;  [facility] ENDOSCOPY;  Service: Endoscopy;  Laterality: N/A;  . TONSILLECTOMY    . TUBAL LIGATION       MEDICATIONS:  Prior to Admission medications   Medication Sig Start Date End Date Taking? Authorizing Provider  apixaban (ELIQUIS) 5 MG TABS tablet Take 1 tablet (5 mg total) by mouth 2 (two) times daily. 03/20/19  Yes Altamese Dilling, MD  Calcium Carb-Cholecalciferol (CALCIUM-VITAMIN D) 500-200 MG-UNIT tablet  Take 1 tablet by mouth 2 (two) times daily.   Yes [provider]  dicyclomine (BENTYL) 10 MG capsule Take 10 mg by mouth every 6 (six) hours as needed. 12/12/19  Yes [provider]  diltiazem (CARDIZEM CD) 240 MG 24 hr capsule Take 1 capsule (240 mg total) by mouth daily. 03/21/19  Yes Altamese Dilling, MD  folic acid (FOLVITE) 1 MG tablet Take 1 mg by mouth daily. Per tube once daily     Yes [provider]  furosemide (LASIX) 20 MG tablet Take 20 mg by mouth daily. 03/05/20  Yes [provider]  hydrocortisone (ANUSOL-HC) 2.5 % rectal cream Apply 1 application topically 2 (two) times daily as needed. 02/28/20  Yes [provider]  levothyroxine (SYNTHROID) 75 MCG tablet Take 75 mcg by mouth daily before breakfast.    Yes [provider]  metoprolol succinate (TOPROL-XL) 25 MG 24 hr tablet Take 50 mg by mouth at bedtime.  03/05/19  Yes [provider]  nitroGLYCERIN (NITROSTAT) 0.4 MG SL tablet Place 0.4 mg under the tongue every 5 (five) minutes as needed.  12/06/19 12/05/20 Yes [provider]  omeprazole (PRILOSEC) 20 MG capsule Take 20 mg by mouth daily.   Yes [provider]  potassium chloride (KLOR-CON) 10 MEQ tablet Take 10 mEq by mouth daily.   Yes [provider]  NON FORMULARY Take 1 tablet by mouth daily.    [provider]     ALLERGIES:  Allergies  Allergen Reactions  . Prednisone Hives  . Predicort [Prednisolone Acetate]     HIVES,     SOCIAL HISTORY:  Social History   Socioeconomic History  . Marital status: Widowed    Spouse name: Not on file  . Number of children: 2  . Years of education: Not on file  . Highest education level: Not on file  Occupational History  . Occupation: medical transcript  Tobacco Use  . Smoking status: Never Smoker  . Smokeless tobacco: Never Used  Vaping Use  . Vaping Use: Never used  Substance and Sexual Activity  . Alcohol use: No  .  Drug use: No  . Sexual activity: Never  Other Topics Concern  . Not on file  Social History Narrative  . Not on file   Social Determinants of Health   Financial Resource Strain: Low Risk   . Difficulty of Paying Living Expenses: Not hard at all  Food Insecurity: No Food Insecurity  . Worried About Programme researcher, broadcasting/film/video in the Last Year: Never true  . Ran Out of Food in the Last Year: Never true  Transportation Needs: No Transportation Needs  . Lack of Transportation (Medical): No  . Lack of Transportation (Non-Medical): No  Physical Activity: Insufficiently Active  . Days of Exercise per Week: 2 days  . Minutes of Exercise per Session: 30 min  Stress: No Stress Concern Present  . Feeling of Stress : Not at all  Social Connections: Moderately Isolated  . Frequency of Communication with Friends and Family: More than three times a week  . Frequency of Social Gatherings with Friends and Family: More than three times a week  . Attends Religious Services: 1 to 4 times per year  . Active Member of Clubs or Organizations: No  . Attends  Club or Organization Meetings: Never  . Marital Status: Widowed  Intimate Partner Violence: Not At Risk  . Fear of Current or Ex-Partner: No  . Emotionally Abused: No  . Physically Abused: No  . Sexually Abused: No     FAMILY HISTORY:  Family History  Problem Relation Age of Onset  . Prostate cancer Father   . Breast cancer Maternal Aunt   . Breast cancer Maternal Aunt   . Cerebral palsy Son       REVIEW OF SYSTEMS:  Review of Systems  Constitutional: Negative for chills and fever.  HENT: Negative for congestion and sore throat.   Respiratory: Negative for cough and shortness of breath.   Cardiovascular: Negative for chest pain and palpitations.  Gastrointestinal: Positive for abdominal pain, constipation, nausea and vomiting. Negative for blood in stool and diarrhea.  Genitourinary: Negative for dysuria and urgency.  All other systems  reviewed and are negative.   VITAL SIGNS:  Temp:  [97.8 F (36.6 C)-98.3 F (36.8 C)] 97.8 F (36.6 C) (10/10 1807) Pulse Rate:  [90-125] 92 (10/11 0300) Resp:  [18-26] 20 (10/11 0500) BP: (103-124)/(63-104) 115/83 (10/11 0500) SpO2:  [92 %-99 %] 99 % (10/11 0500) Weight:  [63.5 kg] 63.5 kg (10/10 1325)     Height: 5\' 5"  (165.1 cm) Weight: 63.5 kg BMI (Calculated): 23.3   INTAKE/OUTPUT:  10/10 0701 - 10/11 0700 In: 1998 [IV Piggyback:1998] Out: -   PHYSICAL EXAM:  Physical Exam Vitals and nursing note reviewed.  Constitutional:      General: She is not in acute distress.    Appearance: She is well-developed and normal weight. She is not ill-appearing.     Comments: Patient resting in bed   HENT:     Head: Normocephalic and atraumatic.  Eyes:     General: No scleral icterus.    Extraocular Movements: Extraocular movements intact.  Cardiovascular:     Rate and Rhythm: Normal rate.  Pulmonary:     Effort: Pulmonary effort is normal. No respiratory distress.     Breath sounds: Normal breath sounds.  Abdominal:     General: There is distension.     Palpations: Abdomen is rigid.     Tenderness: There is generalized abdominal tenderness. There is rebound. There is no guarding.     Comments: Patient's abdomen is markedly distended, diffusely tender, and she has some degree of rebound tenderness.   Genitourinary:    Comments: Deferred Skin:    General: Skin is warm and dry.     Coloration: Skin is not jaundiced or pale.  Neurological:     General: No focal deficit present.     Mental Status: She is alert and oriented to person, place, and time.  Psychiatric:        Mood and Affect: Mood normal.        Behavior: Behavior normal.      Labs:  CBC Latest Ref Rng & Units 03/12/2020 03/11/2020 02/22/2020  WBC 4.0 - 10.5 K/uL 15.8(H) 13.3(H) 10.8(H)  Hemoglobin 12.0 - 15.0 g/dL 02/24/2020 15.3(H) 12.7  Hematocrit 36 - 46 % 41.1 46.3(H) 37.7  Platelets 150 - 400 K/uL 253 290 250    CMP Latest Ref Rng & Units 03/12/2020 03/11/2020 02/22/2020  Glucose 70 - 99 mg/dL 02/24/2020) 001(V) 494(W)  BUN 8 - 23 mg/dL 967(R) 22 11  Creatinine 0.44 - 1.00 mg/dL 91(M 3.84 6.65  Sodium 135 - 145 mmol/L 128(L) 126(L) 138  Potassium 3.5 - 5.1 mmol/L 4.2 4.6  3.6  Chloride 98 - 111 mmol/L 99 91(L) 103  CO2 22 - 32 mmol/L 19(L) 22 22  Calcium 8.9 - 10.3 mg/dL 8.0(L) 9.2 8.6(L)  Total Protein 6.5 - 8.1 g/dL - 8.1 -  Total Bilirubin 0.3 - 1.2 mg/dL - 1.5(H) -  Alkaline Phos 38 - 126 U/L - 83 -  AST 15 - 41 U/L - 30 -  ALT 0 - 44 U/L - 20 -     Imaging studies:   CT Abdomen/Pelvis (03/11/2020) personally reviewed showing marked colonic distension, sigmoid appears decompressed, diverticulosis without inflammation/fluid/free air, and radiologist report reviewed:  IMPRESSION: 1. Moderate diffuse colonic distension likely related to a degree of obstruction of the sigmoid colon, secondary to muscular hypertrophy or stricture. Underlying mass is not excluded. Clinical correlation and follow-up recommended. 2. Sigmoid diverticulosis with muscular hypertrophy. No active inflammatory changes. 3. Moderate cardiomegaly with biatrial dilatation. 4. Trace left and small right pleural effusions. Patchy right lung base consolidation, likely atelectasis. Pneumonia is not excluded. 5. Aortic Atherosclerosis (ICD10-I70.0).   Assessment/Plan: (ICD-10's: K67.609) 74 y.o. female with marked abdominal distension and diffuse tenderness found to have marked clonic distension concerning for large bowel obstruction most likely at the level of the sigmoid colon with unclear etiology, complicated by pertinent comorbidities including Afib, CHF.   - I am concerned this morning given the degree of colonic distension on examination and imaging concomitant with her degree of pain that she may have an impending perforation. We will attempt to get STAT gastrografin enema this morning to determine if this is a partial  or complete obstruction. However, I do think that it may still be in her best interest to proceed with exploratory laparotomy regards today given the degree of distension and physical examination findings. I have discussed this with the patient and her son [name] [facility]) including lack of clear etiology, disease process, and treatment recommendations including surgery. They voiced understanding of these and would be willing to proceed with any recommended interventions. In the interim, recommend NPO, IVF resuscitation, I will initiate IV Abx (Zosyn), pain control, monitor abdominal examination closely, hold anticoagulation. I will also tentatively post her for exploratory laparotomy today with Dr Claudine Mouton, MD. We will of course follow closely.   All of the above findings and recommendations were discussed with the patient her family (son - [name]), and all of their questions were answered to their expressed satisfaction.  Thank you for the opportunity to participate in this patient's care.   -- Lynden Oxford, PA-C [facility] 03/12/2020, 7:20 AM [phone] M-F: 7am - 4pm

## 2020-03-12 NOTE — H&P (Signed)
History and Physical    [name]:[id number] DOB: 10-Aug-1945 DOA: 03/11/2020  PCP: Mickel Fuchs, MD  Patient coming from: Home  I have personally briefly reviewed patient's old medical records in [facility]  Chief Complaint: nausea, vomiting and abdominal pain  HPI: [name] is a 74 y.o. female with medical history significant for chronic diastolic heart failure, atrial fibrillation on Eliquis, history of CVA, hypothyroidism, iron deficiency anemia, history of diverticulitis who presents with concerns of nausea, vomiting and abdominal pain and distention.  About a week ago patient and tried a new spicy [place] dish and begin to have nausea and vomiting for about 4 days. Also noted increasing lower abdominal pain and gradual increase abdominal distention. Has not had bowel movement for 5 days despite taking milk of magnesia and MiraLAX.  She recently had diverticulitis and underwent a colonoscopy in August due to concerns of possible sigmoid obstruction.  Colonoscopy showed only significant sigmoid edema at that time.  ED Course: She was afebrile, tachycardic and tachypneic on room air. CBC with leukocytosis of 13.3, sodium with hyponatremia 126.  CT abdomen pelvis show moderate diffuse colonic distention with a degree of obstruction at the sigmoid colon possibly secondary to muscular hypertrophy versus stricture.  No active diverticulitis.  ED Dr. Cyril Loosen discussed with surgery who recommended no intervention. He then discussed with GI and they recommend pt undergo a gastrografin enema in the morning.   Review of Systems: Constitutional: No Weight Change, No Fever ENT/Mouth: No sore throat, No Rhinorrhea Eyes: No Eye Pain, No Vision Changes Cardiovascular: No Chest Pain, no SOB Respiratory: No Cough, No Sputum Gastrointestinal: No Nausea, No Vomiting, No Diarrhea, + Constipation, + Pain Genitourinary: no Urinary Incontinence, No Urgency, No Flank  Pain Musculoskeletal: No Arthralgias, No Myalgias Skin: No Skin Lesions, No Pruritus, Neuro: no Weakness, No Numbness Psych: No Anxiety/Panic, No Depression, + decrease appetite Heme/Lymph: No Bruising, No Bleeding   Past Medical History:  Diagnosis Date  . Acute respiratory failure (HCC)    Secondary to aspiration pneumonia   . Altered mental status    Secondary to viral herpes simplex virus encephalitis  . Anemia   . Atrial fibrillation (HCC)   . Bilateral pneumonia   . CHF (congestive heart failure) (HCC)   . Dysphasia   . Encephalitis   . Hyperglycemia   . Hypertension   . Hyponatremia   . IBS (irritable bowel syndrome)   . Seizures (HCC)   . Stroke [facility])     Past Surgical History:  Procedure Laterality Date  . CHOLECYSTECTOMY    . COLONOSCOPY N/A 01/17/2020   Procedure: COLONOSCOPY;  Surgeon: Regis Bill, MD;  Location: [facility] ENDOSCOPY;  Service: Endoscopy;  Laterality: N/A;  . COLONOSCOPY WITH PROPOFOL N/A 12/31/2016   Procedure: COLONOSCOPY WITH PROPOFOL;  Surgeon: Scot Jun, MD;  Location: [facility] ENDOSCOPY;  Service: Endoscopy;  Laterality: N/A;  . ESOPHAGOGASTRODUODENOSCOPY (EGD) WITH PROPOFOL N/A 12/31/2016   Procedure: ESOPHAGOGASTRODUODENOSCOPY (EGD) WITH PROPOFOL;  Surgeon: Scot Jun, MD;  Location: [facility] ENDOSCOPY;  Service: Endoscopy;  Laterality: N/A;  . TONSILLECTOMY    . TUBAL LIGATION       reports that she has never smoked. She has never used smokeless tobacco. She reports that she does not drink alcohol and does not use drugs. Social History  Allergies  Allergen Reactions  . Prednisone Hives  . Predicort [Prednisolone Acetate]     HIVES,    Family History  Problem Relation Age of  Onset  . Prostate cancer Father   . Breast cancer Maternal Aunt   . Breast cancer Maternal Aunt   . Cerebral palsy Son      Prior to Admission medications   Medication Sig Start Date End Date Taking? Authorizing Provider  apixaban (ELIQUIS) 5  MG TABS tablet Take 1 tablet (5 mg total) by mouth 2 (two) times daily. 03/20/19  Yes Altamese Dilling, MD  Calcium Carb-Cholecalciferol (CALCIUM-VITAMIN D) 500-200 MG-UNIT tablet Take 1 tablet by mouth 2 (two) times daily.   Yes [provider]  dicyclomine (BENTYL) 10 MG capsule Take 10 mg by mouth every 6 (six) hours as needed. 12/12/19  Yes [provider]  diltiazem (CARDIZEM CD) 240 MG 24 hr capsule Take 1 capsule (240 mg total) by mouth daily. 03/21/19  Yes Altamese Dilling, MD  folic acid (FOLVITE) 1 MG tablet Take 1 mg by mouth daily. Per tube once daily     Yes [provider]  furosemide (LASIX) 20 MG tablet Take 20 mg by mouth daily. 03/05/20  Yes [provider]  hydrocortisone (ANUSOL-HC) 2.5 % rectal cream Apply 1 application topically 2 (two) times daily as needed. 02/28/20  Yes [provider]  levothyroxine (SYNTHROID) 75 MCG tablet Take 75 mcg by mouth daily before breakfast.    Yes [provider]  metoprolol succinate (TOPROL-XL) 25 MG 24 hr tablet Take 50 mg by mouth at bedtime.  03/05/19  Yes [provider]  nitroGLYCERIN (NITROSTAT) 0.4 MG SL tablet Place 0.4 mg under the tongue every 5 (five) minutes as needed.  12/06/19 12/05/20 Yes [provider]  omeprazole (PRILOSEC) 20 MG capsule Take 20 mg by mouth daily.   Yes [provider]  potassium chloride (KLOR-CON) 10 MEQ tablet Take 10 mEq by mouth daily.   Yes [provider]  NON FORMULARY Take 1 tablet by mouth daily.    [provider]    Physical Exam: Vitals:   03/11/20 1937 03/11/20 2130 03/12/20 0030 03/12/20 0100  BP: 121/83 (!) 123/95 (!) 120/104 119/84  Pulse: (!) 101 (!) 125 (!) 118 (!) 101  Resp: (!) 25 (!) 22 19 (!) 22  Temp:      TempSrc:      SpO2: 95% 94% 93%   Weight:      Height:        Constitutional: NAD, calm, comfortable, elderly female laying in bed Vitals:   03/11/20 1937 03/11/20  2130 03/12/20 0030 03/12/20 0100  BP: 121/83 (!) 123/95 (!) 120/104 119/84  Pulse: (!) 101 (!) 125 (!) 118 (!) 101  Resp: (!) 25 (!) 22 19 (!) 22  Temp:      TempSrc:      SpO2: 95% 94% 93%   Weight:      Height:       Eyes: PERRL, lids and conjunctivae normal ENMT: Mucous membranes are moist.  Neck: normal, supple Respiratory: clear to auscultation bilaterally, no wheezing, no crackles. Normal respiratory effort. No accessory muscle use.  Cardiovascular: Regular rate and rhythm, no murmurs / rubs / gallops. No extremity edema. 2+ pedal pulses. No carotid bruits.  Abdomen: Moderately distended abdomen with mild tenderness with palpation.  Decreased bowel sounds to the left lower quadrant.  No rebound tenderness, guarding or rigidity. Musculoskeletal: no clubbing / cyanosis. No joint deformity upper and lower extremities. Good ROM, no contractures. Normal muscle tone.  Skin: no rashes, lesions, ulcers. No induration Neurologic: CN 2-12 grossly intact. Sensation intact, Strength 5/5  in all 4.  Psychiatric: Normal judgment and insight. Alert and oriented x 3. Normal mood.     Labs on Admission: I have personally reviewed following labs and imaging studies  CBC: Recent Labs  Lab 03/11/20 1335  WBC 13.3*  HGB 15.3*  HCT 46.3*  MCV 79.3*  PLT 290   Basic Metabolic Panel: Recent Labs  Lab 03/11/20 1335  NA 126*  K 4.6  CL 91*  CO2 22  GLUCOSE 125*  BUN 22  CREATININE 0.98  CALCIUM 9.2   GFR: Estimated Creatinine Clearance: 43.9 mL/min (by C-G formula based on SCr of 0.98 mg/dL). Liver Function Tests: Recent Labs  Lab 03/11/20 1335  AST 30  ALT 20  ALKPHOS 83  BILITOT 1.5*  PROT 8.1  ALBUMIN 4.1   Recent Labs  Lab 03/11/20 1335  LIPASE 27   No results for input(s): AMMONIA in the last 168 hours. Coagulation Profile: No results for input(s): INR, PROTIME in the last 168 hours. Cardiac Enzymes: No results for input(s): CKTOTAL, CKMB, CKMBINDEX, TROPONINI in  the last 168 hours. BNP (last 3 results) No results for input(s): PROBNP in the last 8760 hours. HbA1C: No results for input(s): HGBA1C in the last 72 hours. CBG: No results for input(s): GLUCAP in the last 168 hours. Lipid Profile: No results for input(s): CHOL, HDL, LDLCALC, TRIG, CHOLHDL, LDLDIRECT in the last 72 hours. Thyroid Function Tests: No results for input(s): TSH, T4TOTAL, FREET4, T3FREE, THYROIDAB in the last 72 hours. Anemia Panel: No results for input(s): VITAMINB12, FOLATE, FERRITIN, TIBC, IRON, RETICCTPCT in the last 72 hours. Urine analysis:    Component Value Date/Time   COLORURINE YELLOW (A) 03/11/2020 1957   APPEARANCEUR CLEAR (A) 03/11/2020 1957   APPEARANCEUR Clear 08/29/2011 0037   LABSPEC >1.046 (H) 03/11/2020 1957   LABSPEC 1.009 08/29/2011 0037   PHURINE 5.0 03/11/2020 1957   GLUCOSEU NEGATIVE 03/11/2020 1957   GLUCOSEU Negative 08/29/2011 0037   HGBUR SMALL (A) 03/11/2020 1957   BILIRUBINUR NEGATIVE 03/11/2020 1957   BILIRUBINUR Negative 08/29/2011 0037   KETONESUR 5 (A) 03/11/2020 1957   PROTEINUR NEGATIVE 03/11/2020 1957   UROBILINOGEN 0.2 01/17/2011 0037   NITRITE NEGATIVE 03/11/2020 1957   LEUKOCYTESUR NEGATIVE 03/11/2020 1957   LEUKOCYTESUR 1+ 08/29/2011 0037    Radiological Exams on Admission: CT ABDOMEN PELVIS W CONTRAST  Result Date: 03/11/2020 CLINICAL DATA:  74 year old female with abdominal distension. EXAM: CT ABDOMEN AND PELVIS WITH CONTRAST TECHNIQUE: Multidetector CT imaging of the abdomen and pelvis was performed using the standard protocol following bolus administration of intravenous contrast. CONTRAST:  OMNIPAQUE IOHEXOL 300 MG/ML  SOLN COMPARISON:  CT abdomen pelvis dated 12/21/2019. FINDINGS: Lower chest: Trace left and small right pleural effusions. Patchy right lung base consolidation, likely atelectasis. Pneumonia is not excluded clinical correlation is recommended. There is moderate cardiomegaly with biatrial  dilatation. There is retrograde flow of contrast from the right atrium into the IVC suggestive of a degree of right heart dysfunction. There is calcification of the mitral annulus. No intra-abdominal free air. Trace free fluid in the pelvis. Hepatobiliary: There is mild irregularity of the liver contour suggestive of early changes of cirrhosis. Bilobed or 2 adjacent cysts in the left lobe of the liver with combined dimension of 2 cm. No intrahepatic biliary ductal dilatation. Cholecystectomy. No retained calcified stone noted in the central CBD. Pancreas: No acute findings. Spleen: Normal in size without focal abnormality. Adrenals/Urinary Tract: The adrenal glands unremarkable. Mild bilateral parenchyma atrophy. There is no hydronephrosis  on either side. There is symmetric enhancement and excretion of contrast by both kidneys. The visualized ureters and urinary bladder appear unremarkable. Stomach/Bowel: There is sigmoid diverticulosis with muscular hypertrophy. There is moderate diffuse colonic distension with air extending to the level of the sigmoid colon suggestive of a degree of obstruction of the sigmoid colon, likely related to muscular hypertrophy or stricture. Underlying mass is not excluded. Clinical correlation and follow-up recommended. Scattered tiny pockets of air along the wall of the cecum and proximal colon likely mixed with the colonic content and less likely pneumatosis. Correlation with lactic acid recommended to exclude bowel ischemia. There is a small hiatal hernia. No evidence of small-bowel obstruction. Vascular/Lymphatic: Mild aortoiliac atherosclerotic disease. The IVC is unremarkable. No portal venous gas. There is no adenopathy. Reproductive: The uterus is grossly unremarkable. Other: Small fat containing umbilical hernia. Musculoskeletal: No acute or significant osseous findings. IMPRESSION: 1. Moderate diffuse colonic distension likely related to a degree of obstruction of the sigmoid  colon, secondary to muscular hypertrophy or stricture. Underlying mass is not excluded. Clinical correlation and follow-up recommended. 2. Sigmoid diverticulosis with muscular hypertrophy. No active inflammatory changes. 3. Moderate cardiomegaly with biatrial dilatation. 4. Trace left and small right pleural effusions. Patchy right lung base consolidation, likely atelectasis. Pneumonia is not excluded. 5. Aortic Atherosclerosis (ICD10-I70.0). Electronically Signed   By: Elgie Collard M.D.   On: 03/11/2020 19:45   DG Abd 2 Views  Result Date: 03/11/2020 CLINICAL DATA:  Abdominal pain EXAM: ABDOMEN - 2 VIEW COMPARISON:  December 12, 2019 FINDINGS: There is marked gaseous dilation of loops colon. The cecum measures approximately 13 cm. No definitive free air. There are nonspecific air-fluid levels on upright radiographs. Small amount of air seen within the rectum. Bibasilar heterogeneous opacities, nonspecific. Surgical clips project over the upper abdomen. Degenerative changes of the lower lumbar spine. IMPRESSION: 1. Marked gaseous dilation of loops of colon. Small amount of air seen within the rectum. Findings are concerning for colonic obstruction versus ileus. Recommend further evaluation with dedicated contrast enhanced CT. 2.  Bibasilar heterogeneous opacities, nonspecific. Electronically Signed   By: Meda Klinefelter MD   On: 03/11/2020 15:37      Assessment/Plan  Constipation/abdominal distention concerning for possible sigmoid colon obstruction GI recommends Gastrografin enema in the morning.  Will need to consult with radiology to place order. Keep n.p.o.  Hyponatremia Likely secondary to hypovolemia from decrease PO intake received 500cc bolus in ED. Continue low rate IV infusion and repeat labs in the morning  Atrial fibrillation Hold Eliquis overnight pending result of gastrografin enema Continue Cardizem and metoprolol  Chronic diastolic heart failure Appears euvolemic on  exam Hold Lasix while getting fluids with hyponatremia  Hypothyroidism Continue levothyroxine  DVT prophylaxis:SCDs Code Status: DNR Family Communication: Plan discussed with patient at bedside  disposition Plan: Home with at least 2 midnight stays  Consults called:  Admission status: inpatient  Status is: Inpatient  Remains inpatient appropriate because:Inpatient level of care appropriate due to severity of illness   Dispo: The patient is from: Home              Anticipated d/c is to: Home              Anticipated d/c date is: 3 days              Patient currently is not medically stable to d/c.         Anselm Jungling DO [facility]   If 7PM-7AM,  please contact night-coverage [email or web address]   03/12/2020, 1:43 AM

## 2020-03-12 NOTE — Transfer of Care (Addendum)
Immediate Anesthesia Transfer of Care Note  Patient: Orlin Hilding  Procedure(s) Performed: EXPLORATORY LAPAROTOMY (N/A ) COLECTOMY WITH COLOSTOMY CREATION/HARTMANN PROCEDURE (N/A Abdomen)  Patient Location: PACU  Anesthesia Type:General  Level of Consciousness: drowsy and patient cooperative  Airway & Oxygen Therapy: Patient Spontanous Breathing and Patient connected to face mask oxygen  Post-op Assessment: Report given to RN and Post -op Vital signs reviewed and stable  Patient with low systolic BP, Dr. Pernell Dupre made aware and plan communicated to PACU RN to give 500 mL crystalloid fluid bolus.  Post vital signs: Reviewed and stable  Last Vitals:  Vitals Value Taken Time  BP 86/68 03/12/20 1443  Temp    Pulse 101 03/12/20 1449  Resp 19 03/12/20 1449  SpO2 99 % 03/12/20 1449  Vitals shown include unvalidated device data.  Last Pain:  Vitals:   03/12/20 1109  TempSrc: Temporal  PainSc: 2          Complications: No complications documented.

## 2020-03-12 NOTE — Consult Note (Signed)
Lauren Bouillon, MD [address], [place], [place], [address] [address], [address], [place], [place], [address] Phone: [phone]  Fax: [phone]  Consultation  Referring Provider:     Dr. Denton Lank Primary Care Physician:  Mickel Fuchs, MD Reason for Consultation:     Colon obstruction Primary gastroenterologist: Dr. Mia Creek  Date of Admission:  03/11/2020 Date of Consultation:  03/12/2020         HPI:   [name] is a 74 y.o. female who presented with abdominal distention, possible sigmoid colon obstruction and GI was consulted.  I started GI call this morning, and was informed about this consult this morning by Dr. Tobi Bastos who is on-call overnight and was notified about this consult last night, signed out the consult to me this morning.  I went to see the patient in her room this morning but she was already taken to surgery and was only able to see her postoperatively  Due to her symptoms, hospitalist had discussed her presentation with Dr. Tobi Bastos and he recommended imaging with Gastrografin enema.  However, due to worsening clinical signs and symptoms, she was taken for emergency surgery and I am evaluating her postoperatively.  CT on presentation yesterday showed moderate diffuse colonic distention, degree of obstruction in the sigmoid colon, secondary to muscular hypertrophy or stricture, underlying mass not excluded.  Double contrast barium enema this morning showed complete colonic obstruction at the level of the mid to distal sigmoid colon  Patient is currently lethargic from sedation postoperatively.  Does not have any nausea or vomiting at this time.  Has an ostomy bag in place, with operative report pending.  Review of her previous records show that patient has been seen by Dr. Mia Creek recently in August 2021.  As per his report patient had diverticulitis in June.  Abdominal pain had improved but CT showed strictured area in the sigmoid colon and  she underwent colonoscopy on January 17, 2020 with him.  Colonoscopy showed diverticulosis in the sigmoid colon, with significant edema in the area leading to significant narrowing and this was biopsied.  Diverticulosis also noted in the transverse colon.  Internal hemorrhoids reported.  Pathology showed mild active colitis compatible with biopsy adjacent to inflamed diverticular disease.  As per telephone note from August 26 by him, "called patient to relay biopsy results.  Mild inflammation related to diverticulitis.  Would not repeat colonoscopy due to age."  Past Medical History:  Diagnosis Date  . Acute respiratory failure (HCC)    Secondary to aspiration pneumonia   . Altered mental status    Secondary to viral herpes simplex virus encephalitis  . Anemia   . Atrial fibrillation (HCC)   . Bilateral pneumonia   . CHF (congestive heart failure) (HCC)   . Dysphasia   . Encephalitis   . Hyperglycemia   . Hypertension   . Hyponatremia   . IBS (irritable bowel syndrome)   . Seizures (HCC)   . Stroke [facility])     Past Surgical History:  Procedure Laterality Date  . CHOLECYSTECTOMY    . COLONOSCOPY N/A 01/17/2020   Procedure: COLONOSCOPY;  Surgeon: Regis Bill, MD;  Location: [facility] ENDOSCOPY;  Service: Endoscopy;  Laterality: N/A;  . COLONOSCOPY WITH PROPOFOL N/A 12/31/2016   Procedure: COLONOSCOPY WITH PROPOFOL;  Surgeon: Scot Jun, MD;  Location: [facility] ENDOSCOPY;  Service: Endoscopy;  Laterality: N/A;  . ESOPHAGOGASTRODUODENOSCOPY (EGD) WITH PROPOFOL N/A 12/31/2016   Procedure: ESOPHAGOGASTRODUODENOSCOPY (EGD) WITH PROPOFOL;  Surgeon:  Scot Jun, MD;  Location: [facility] ENDOSCOPY;  Service: Endoscopy;  Laterality: N/A;  . TONSILLECTOMY    . TUBAL LIGATION      Prior to Admission medications   Medication Sig Start Date End Date Taking? Authorizing Provider  apixaban (ELIQUIS) 5 MG TABS tablet Take 1 tablet (5 mg total) by mouth 2 (two) times daily. 03/20/19  Yes Altamese Dilling, MD  Calcium Carb-Cholecalciferol (CALCIUM-VITAMIN D) 500-200 MG-UNIT tablet Take 1 tablet by mouth 2 (two) times daily.   Yes [provider]  dicyclomine (BENTYL) 10 MG capsule Take 10 mg by mouth every 6 (six) hours as needed. 12/12/19  Yes [provider]  diltiazem (CARDIZEM CD) 240 MG 24 hr capsule Take 1 capsule (240 mg total) by mouth daily. 03/21/19  Yes Altamese Dilling, MD  folic acid (FOLVITE) 1 MG tablet Take 1 mg by mouth daily. Per tube once daily     Yes [provider]  furosemide (LASIX) 20 MG tablet Take 20 mg by mouth daily. 03/05/20  Yes [provider]  hydrocortisone (ANUSOL-HC) 2.5 % rectal cream Apply 1 application topically 2 (two) times daily as needed. 02/28/20  Yes [provider]  levothyroxine (SYNTHROID) 75 MCG tablet Take 75 mcg by mouth daily before breakfast.    Yes [provider]  metoprolol succinate (TOPROL-XL) 25 MG 24 hr tablet Take 50 mg by mouth at bedtime.  03/05/19  Yes [provider]  nitroGLYCERIN (NITROSTAT) 0.4 MG SL tablet Place 0.4 mg under the tongue every 5 (five) minutes as needed.  12/06/19 12/05/20 Yes [provider]  omeprazole (PRILOSEC) 20 MG capsule Take 20 mg by mouth daily.   Yes [provider]  potassium chloride (KLOR-CON) 10 MEQ tablet Take 10 mEq by mouth daily.   Yes [provider]  NON FORMULARY Take 1 tablet by mouth daily.    [provider]    Family History  Problem Relation Age of Onset  . Prostate cancer Father   . Breast cancer Maternal Aunt   . Breast cancer Maternal Aunt   . Cerebral palsy Son      Social History   Tobacco Use  . Smoking status: Never Smoker  . Smokeless tobacco: Never Used  Vaping Use  . Vaping Use: Never used  Substance Use Topics  . Alcohol use: No  . Drug use: No    Allergies as of 03/11/2020 - Review Complete 03/11/2020  Allergen Reaction Noted  . Prednisone Hives  10/06/2016  . Predicort [prednisolone acetate]  03/27/2011    Review of Systems:    All systems reviewed and negative except where noted in HPI.   Physical Exam:  Vital signs in last 24 hours: Vitals:   03/12/20 1507 03/12/20 1518 03/12/20 1520 03/12/20 1533  BP: 90/65 96/74    Pulse: 80 (!) 108 (!) 115 (!) 121  Resp: 19 16 19 19   Temp:      TempSrc:      SpO2: 97% 97% 97% 96%  Weight:      Height:         General:   Pleasant, cooperative in NAD Head:  Normocephalic and atraumatic. Eyes:   No icterus.   Conjunctiva pink. PERRLA. Ears:  Normal auditory acuity. Neck:  Supple; no masses or thyroidomegaly Lungs: Respirations even and unlabored. Lungs clear to auscultation bilaterally.   No wheezes, crackles, or rhonchi.  Abdomen:  Soft, nondistended, nontender. Normal bowel sounds. No appreciable masses or hepatomegaly.  No  rebound or guarding.  Neurologic:  Alert and oriented x3;  grossly normal neurologically. Skin:  Intact without significant lesions or rashes. Cervical Nodes:  No significant cervical adenopathy. Psych:  Alert and cooperative. Normal affect.  LAB RESULTS: Recent Labs    03/11/20 1335 03/12/20 0445  WBC 13.3* 15.8*  HGB 15.3* 13.7  HCT 46.3* 41.1  PLT 290 253   BMET Recent Labs    03/11/20 1335 03/12/20 0445  NA 126* 128*  K 4.6 4.2  CL 91* 99  CO2 22 19*  GLUCOSE 125* 109*  BUN 22 26*  CREATININE 0.98 0.68  CALCIUM 9.2 8.0*   LFT Recent Labs    03/11/20 1335  PROT 8.1  ALBUMIN 4.1  AST 30  ALT 20  ALKPHOS 83  BILITOT 1.5*   PT/INR No results for input(s): LABPROT, INR in the last 72 hours.  STUDIES: CT ABDOMEN PELVIS W CONTRAST  Result Date: 03/11/2020 CLINICAL DATA:  74 year old female with abdominal distension. EXAM: CT ABDOMEN AND PELVIS WITH CONTRAST TECHNIQUE: Multidetector CT imaging of the abdomen and pelvis was performed using the standard protocol following bolus administration of intravenous contrast. CONTRAST:   100mL OMNIPAQUE IOHEXOL 300 MG/ML  SOLN COMPARISON:  CT abdomen pelvis dated 12/21/2019. FINDINGS: Lower chest: Trace left and small right pleural effusions. Patchy right lung base consolidation, likely atelectasis. Pneumonia is not excluded clinical correlation is recommended. There is moderate cardiomegaly with biatrial dilatation. There is retrograde flow of contrast from the right atrium into the IVC suggestive of a degree of right heart dysfunction. There is calcification of the mitral annulus. No intra-abdominal free air. Trace free fluid in the pelvis. Hepatobiliary: There is mild irregularity of the liver contour suggestive of early changes of cirrhosis. Bilobed or 2 adjacent cysts in the left lobe of the liver with combined dimension of 2 cm. No intrahepatic biliary ductal dilatation. Cholecystectomy. No retained calcified stone noted in the central CBD. Pancreas: No acute findings. Spleen: Normal in size without focal abnormality. Adrenals/Urinary Tract: The adrenal glands unremarkable. Mild bilateral parenchyma atrophy. There is no hydronephrosis on either side. There is symmetric enhancement and excretion of contrast by both kidneys. The visualized ureters and urinary bladder appear unremarkable. Stomach/Bowel: There is sigmoid diverticulosis with muscular hypertrophy. There is moderate diffuse colonic distension with air extending to the level of the sigmoid colon suggestive of a degree of obstruction of the sigmoid colon, likely related to muscular hypertrophy or stricture. Underlying mass is not excluded. Clinical correlation and follow-up recommended. Scattered tiny pockets of air along the wall of the cecum and proximal colon likely mixed with the colonic content and less likely pneumatosis. Correlation with lactic acid recommended to exclude bowel ischemia. There is a small hiatal hernia. No evidence of small-bowel obstruction. Vascular/Lymphatic: Mild aortoiliac atherosclerotic disease. The IVC is  unremarkable. No portal venous gas. There is no adenopathy. Reproductive: The uterus is grossly unremarkable. Other: Small fat containing umbilical hernia. Musculoskeletal: No acute or significant osseous findings. IMPRESSION: 1. Moderate diffuse colonic distension likely related to a degree of obstruction of the sigmoid colon, secondary to muscular hypertrophy or stricture. Underlying mass is not excluded. Clinical correlation and follow-up recommended. 2. Sigmoid diverticulosis with muscular hypertrophy. No active inflammatory changes. 3. Moderate cardiomegaly with biatrial dilatation. 4. Trace left and small right pleural effusions. Patchy right lung base consolidation, likely atelectasis. Pneumonia is not excluded. 5. Aortic Atherosclerosis (ICD10-I70.0). Electronically Signed   By: Elgie CollardArash  Radparvar M.D.   On: 03/11/2020 19:45   DG  Abd 2 Views  Result Date: 03/11/2020 CLINICAL DATA:  Abdominal pain EXAM: ABDOMEN - 2 VIEW COMPARISON:  December 12, 2019 FINDINGS: There is marked gaseous dilation of loops colon. The cecum measures approximately 13 cm. No definitive free air. There are nonspecific air-fluid levels on upright radiographs. Small amount of air seen within the rectum. Bibasilar heterogeneous opacities, nonspecific. Surgical clips project over the upper abdomen. Degenerative changes of the lower lumbar spine. IMPRESSION: 1. Marked gaseous dilation of loops of colon. Small amount of air seen within the rectum. Findings are concerning for colonic obstruction versus ileus. Recommend further evaluation with dedicated contrast enhanced CT. 2.  Bibasilar heterogeneous opacities, nonspecific. Electronically Signed   By: Meda Klinefelter MD   On: 03/11/2020 15:37   DG BE (COLON)W SINGLE CM (SOL OR THIN BA)  Result Date: 03/12/2020 CLINICAL DATA:  Evaluate for colonic obstruction EXAM: SINGLE CONTRAST ENEMA TECHNIQUE: Initial scout AP supine abdominal image obtained. Contrast was introduced into the colon  in a retrograde fashion and spot images were obtained. Dilute Omnipaque 300 contrast media was used. FLUOROSCOPY TIME:  Fluoroscopy Time:  1.2 minutes Radiation Exposure Index (if provided by the fluoroscopic device): 28.4 mGy Number of Acquired Spot Images: 12 COMPARISON:  CT 03/11/2020 FINDINGS: Single contrast enema was performed utilizing dilute Omnipaque 300. Contrast filled the rectum and distal sigmoid colon. There is abrupt luminal obstruction the level of the mid to distal sigmoid colon corresponding to site of high-grade luminal narrowing seen on CT. No contrast was able to traverse this level. No extraluminal contrast was visualized. Patient tolerated the procedure well. IMPRESSION: Complete colonic obstruction at the level of the mid to distal sigmoid colon. These results were called by telephone at the conclusion of the exam on 03/12/2020 at 10:53 a.m. to provider Lynden Oxford, PA, who verbally acknowledged these results. Electronically Signed   By: Duanne Guess D.O.   On: 03/12/2020 11:39      Impression / Plan:   [name] is a 74 y.o. y/o female with episode of diverticulitis in June 2021, recent colonoscopy in August 2021 with Dr. Mia Creek showing diverticulosis, with area of significant edema, with biopsies showing mild active colitis, admitted with abdominal distention, and colon obstruction on imaging, now status post emergency surgery  Official operative report not available to me However, postoperative nurse at patient's bedside states patient had exploratory laparotomy, colon resection, ileostomy creation  Specimens have been sent for pathology which is pending at this time which would help evaluate etiology of her obstruction.  Given recent colonoscopy with biopsies negative for malignancy, possible source would be stricture due to diverticulosis/diverticulitis at the site.  However, will await operative note to evaluate if any mass or malignancy was seen during her  surgery  No intervention needed from GI standpoint at this time  Thank you for involving me in the care of this patient.      LOS: 1 day   Pasty Spillers, MD  03/12/2020, 3:37 PM

## 2020-03-12 NOTE — Op Note (Signed)
Subtotal colectomy with end ileostomy.  Pre-operative Diagnosis: Complete obstruction sigmoid colon.  Post-operative Diagnosis: same.   Surgeon: Campbell Lerner, M.D., FACS  Assistant: Lincoln Brigham, PA-C  Anesthesia: General endotracheal.  Findings: Markedly distended cecum with initial serosal tears, questionably viable/safe to maintain in place.  Estimated Blood Loss: 350 mL         Specimens: Ileocecal valve through rectosigmoid junction.          Complications: none              Procedure Details  The patient was seen again in the Holding Room. The benefits, complications, treatment options, and expected outcomes were discussed with the patient. The risks of bleeding, infection, recurrence of symptoms, failure to resolve symptoms, unanticipated injury, prosthetic placement, prosthetic infection, any of which could require further surgery were reviewed with the patient. The likelihood of improving the patient's symptoms with return to their baseline status is reasonable.  The patient and/or family concurred with the proposed plan, giving informed consent.  The patient was taken to Operating Room, identified and the procedure verified.    Prior to the induction of general anesthesia, antibiotic prophylaxis was administered. VTE prophylaxis was in place. General endotracheal anesthesia was then administered and tolerated well. After the induction, the patient was positioned in the supine position and the abdomen was prepped with Chloraprep and draped in a sterile fashion.  A Time Out was held and the above information confirmed. Midline incision was made and carried down through the soft tissues dividing the linea alba. There is a small umbilical defect encountered. Then extended the incision cephalad and caudad. There was a bloody peritoneal fluid present. We then began mobilizing the omentum, and eviscerated the markedly distended cecum. Proceeded with dividing the lateral attachments.  Felt it was prudent to more fully eviscerate this portion to allow exposure/access to the remaining abdominal cavity. Proceeded with division of the distal ileum with a GIA, we then proceeded with dividing the adjacent mesentery with LigaSure. Then mobilized and divided the omentum to take it along with the proximal transverse colon, then mobilized the hepatic flexure dividing the lateral attachments. Then proceeded with dividing the right mesocolon and transverse mesocolon with the LigaSure. With this portion of the colon eviscerated we were then able to more fully address the pelvic structures. I divided the omental attachments utilizing the LigaSure. We then mobilized the sigmoid colon from  lateral to medial. And identifying the ureter. The strictured area of the sigmoid colon was densely adherent to the lateral pelvic sidewall. We then proceeded with mobilization of the left colon from lateral to medial. Mobilized the splenic flexure and preserved the left half of the omentum removing it from its attachments to the distal transverse colon. The remaining colon was then fully mobilized and the remaining mesocolon was divided with the LigaSure. Rectosigmoid junction was mobilized to a point of palpable rectosigmoid that felt normal and uninvolved with the strictured process. We skinny down the fat off this point, and divided it with a TA's stapler. Specimen was then delivered. We irrigated the abdominal cavity with multiple serial aliquots of warm normal saline. Confirmed adequate hemostasis. Then created a right abdominal wall stoma site excising the skin making a small cruciate incision in the anterior rectus fascia and dilating it to accommodate the distal ileum which was mobilized sufficiently on a pedicle to provide adequate blood supply. This was brought out through the opening. Then laid the omentum across the anterior abdominal wall. Close the  linea alba with running 0 PDS suture. We closed the  midline with staples and applied a honeycomb dressing. I then proceeded with excising the distalmost ileum, and matured the ileostomy with interrupted 3-0 Vicryl. Appliance was then applied.      Campbell Lerner M.D., [facility] 03/12/2020 2:38 PM

## 2020-03-12 NOTE — ED Notes (Signed)
Assumed care of pt upon being roomed in [facility]. Denies pain at this time. Alert, oriented to self and place, intermittently oriented to situation. Talking in full sentences with regular and unlabored breathing. Side rails up x2. Belongings on bed. Pt in view of nurses station, pt made aware she has no call bell and will need to speak loudly for assistance if RN is not at nurses station.

## 2020-03-12 NOTE — Anesthesia Procedure Notes (Signed)
Procedure Name: Intubation Date/Time: 03/12/2020 11:51 AM Performed by: Lynden Oxford, CRNA Pre-anesthesia Checklist: Patient identified, Emergency Drugs available, Suction available and Patient being monitored Patient Re-evaluated:Patient Re-evaluated prior to induction Oxygen Delivery Method: Circle system utilized Preoxygenation: Pre-oxygenation with 100% oxygen Induction Type: IV induction Ventilation: Mask ventilation without difficulty Grade View: Grade I Tube type: Oral Tube size: 7.0 mm Number of attempts: 1 Airway Equipment and Method: Stylet,  Oral airway and Video-laryngoscopy Placement Confirmation: ETT inserted through vocal cords under direct vision,  positive ETCO2 and breath sounds checked- equal and bilateral Secured at: 19 cm Tube secured with: Tape Dental Injury: Teeth and Oropharynx as per pre-operative assessment

## 2020-03-12 NOTE — ED Notes (Signed)
Pt ambulated to restroom with RN assistance. Increased abdominal pain with movement. Pt repositioned in bed. Side rails up x2

## 2020-03-12 NOTE — ED Notes (Signed)
Placed pt on 2L [name], desatting to 89% when sleeping

## 2020-03-12 NOTE — Progress Notes (Signed)
   03/12/20 2041  Assess: MEWS Score  Temp 98 F (36.7 C)  BP 100/85  Pulse Rate (!) 109  Resp 20  SpO2 100 %  O2 Device Nasal Cannula  O2 Flow Rate (L/min) 3 L/min  Assess: MEWS Score  MEWS Temp 0  MEWS Systolic 1  MEWS Pulse 1  MEWS RR 0  MEWS LOC 0  MEWS Score 2  MEWS Score Color Yellow  Assess: if the MEWS score is Yellow or Red  Were vital signs taken at a resting state? Yes  Focused Assessment No change from prior assessment  Early Detection of Sepsis Score *See Row Information* Medium  MEWS guidelines implemented *See Row Information* Yes  Take Vital Signs  Increase Vital Sign Frequency  Yellow: Q 2hr X 2 then Q 4hr X 2, if remains yellow, continue Q 4hrs  Notify: Charge Nurse/RN  Name of Charge Nurse/RN Notified Marcella RN   Date Charge Nurse/RN Notified 03/12/20  Time Charge Nurse/RN Notified 2045

## 2020-03-12 NOTE — ED Notes (Signed)
[name] came to take patient to OR. Patient still in x-ray. John to go to X-ray to transport patient.

## 2020-03-12 NOTE — Hospital Course (Signed)
[name] is a 74 y.o. female with medical history significant for chronic dCHF, A-fib on Eliquis, history of CVA, hypothyroidism, iron deficiency anemia, history of diverticulitis who presented to the ED on 03/11/20 with nausea, vomiting and abdominal pain and worsening abdominal distention, and no bowel movement in 5 days.  Onset was in the setting of a spicy meal she'd eaten four days prior and subsequently had few days of nausea/vomiting.    She recently had diverticulitis and underwent a colonoscopy in August due to concerns of possible sigmoid obstruction.  Colonoscopy showed only significant sigmoid edema at that time.  In the ED, afebrile, tachycardic and tachypneic.  Labs significant for leukocytosis of 13.3, sodium 126.  CT abdomen pelvis show moderate diffuse colonic distention with a degree of obstruction at the sigmoid colon possibly secondary to muscular hypertrophy versus stricture.  No active diverticulitis.  General surgery consulted.  On 10/11, patient had Gastrografin enema which showed complete colonic obstruction at the level of the mid to distal sigmoid colon.  Patient was taken to surgery, underwent subtotal colectomy.

## 2020-03-13 ENCOUNTER — Inpatient Hospital Stay: Payer: Self-pay

## 2020-03-13 ENCOUNTER — Encounter: Payer: Self-pay | Admitting: Surgery

## 2020-03-13 ENCOUNTER — Inpatient Hospital Stay: Payer: Medicare Other

## 2020-03-13 DIAGNOSIS — I480 Paroxysmal atrial fibrillation: Secondary | ICD-10-CM | POA: Diagnosis not present

## 2020-03-13 DIAGNOSIS — R140 Abdominal distension (gaseous): Secondary | ICD-10-CM | POA: Diagnosis not present

## 2020-03-13 DIAGNOSIS — K56609 Unspecified intestinal obstruction, unspecified as to partial versus complete obstruction: Secondary | ICD-10-CM | POA: Diagnosis not present

## 2020-03-13 DIAGNOSIS — I5032 Chronic diastolic (congestive) heart failure: Secondary | ICD-10-CM | POA: Diagnosis not present

## 2020-03-13 LAB — MAGNESIUM: Magnesium: 2.1 mg/dL (ref 1.7–2.4)

## 2020-03-13 LAB — BASIC METABOLIC PANEL
Anion gap: 10 (ref 5–15)
BUN: 23 mg/dL (ref 8–23)
CO2: 19 mmol/L — ABNORMAL LOW (ref 22–32)
Calcium: 7.8 mg/dL — ABNORMAL LOW (ref 8.9–10.3)
Chloride: 103 mmol/L (ref 98–111)
Creatinine, Ser: 0.92 mg/dL (ref 0.44–1.00)
GFR, Estimated: >60 mL/min (ref >60)
Glucose, Bld: 87 mg/dL (ref 70–99)
Potassium: 3.8 mmol/L (ref 3.5–5.1)
Sodium: 132 mmol/L — ABNORMAL LOW (ref 135–145)

## 2020-03-13 LAB — CBC
HCT: 37.1 % (ref 36.0–46.0)
Hemoglobin: 12.3 g/dL (ref 12.0–15.0)
MCH: 26.9 pg (ref 26.0–34.0)
MCHC: 33.2 g/dL (ref 30.0–36.0)
MCV: 81 fL (ref 80.0–100.0)
Platelets: 180 10*3/uL (ref 150–400)
RBC: 4.58 MIL/uL (ref 3.87–5.11)
RDW: 17.7 % — ABNORMAL HIGH (ref 11.5–15.5)
WBC: 13 10*3/uL — ABNORMAL HIGH (ref 4.0–10.5)
nRBC: 0 % (ref 0.0–0.2)

## 2020-03-13 LAB — BRAIN NATRIURETIC PEPTIDE
B Natriuretic Peptide: 981.4 pg/mL — ABNORMAL HIGH (ref 0.0–100.0)
B Natriuretic Peptide: 419.8 pg/mL — ABNORMAL HIGH (ref 0.0–100.0)

## 2020-03-13 LAB — PHOSPHORUS: Phosphorus: 4.5 mg/dL (ref 2.5–4.6)

## 2020-03-13 LAB — TSH: TSH: 1.828 u[IU]/mL (ref 0.350–4.500)

## 2020-03-13 LAB — GLUCOSE, CAPILLARY: Glucose-Capillary: 85 mg/dL (ref 70–99)

## 2020-03-13 MED ORDER — AMIODARONE HCL IN DEXTROSE 360-4.14 MG/200ML-% IV SOLN
60.0000 mg/h | INTRAVENOUS | Status: DC
  Administered 2020-03-13 (×2): 60 mg/h via INTRAVENOUS
  Filled 2020-03-13: qty 200

## 2020-03-13 MED ORDER — DILTIAZEM HCL 25 MG/5ML IV SOLN
10.0000 mg | Freq: Once | INTRAVENOUS | Status: AC
  Administered 2020-03-13: 10 mg via INTRAVENOUS
  Filled 2020-03-13: qty 5

## 2020-03-13 MED ORDER — TRAMADOL HCL 50 MG PO TABS
50.0000 mg | ORAL_TABLET | Freq: Four times a day (QID) | ORAL | Status: DC | PRN
  Administered 2020-03-13 – 2020-03-20 (×13): 50 mg via ORAL
  Filled 2020-03-13 (×13): qty 1

## 2020-03-13 MED ORDER — SODIUM CHLORIDE 0.9% FLUSH: 10.0000 mL | INTRAVENOUS | Status: DC | PRN

## 2020-03-13 MED ORDER — DIGOXIN 0.25 MG/ML IJ SOLN
0.5000 mg | Freq: Once | INTRAMUSCULAR | Status: AC
  Administered 2020-03-13: 0.5 mg via INTRAVENOUS
  Filled 2020-03-13: qty 2

## 2020-03-13 MED ORDER — SODIUM CHLORIDE 0.9% FLUSH
10.0000 mL | Freq: Two times a day (BID) | INTRAVENOUS | Status: DC
  Administered 2020-03-14: 20 mL
  Administered 2020-03-14 – 2020-03-15 (×3): 10 mL
  Administered 2020-03-16: 15 mL
  Administered 2020-03-16: 20 mL
  Administered 2020-03-17: 10 mL
  Administered 2020-03-17: 20 mL
  Administered 2020-03-18 – 2020-03-19 (×3): 10 mL
  Administered 2020-03-19: 20 mL
  Administered 2020-03-20: 10 mL

## 2020-03-13 MED ORDER — AMIODARONE HCL IN DEXTROSE 360-4.14 MG/200ML-% IV SOLN
30.0000 mg/h | INTRAVENOUS | Status: DC
  Administered 2020-03-13 – 2020-03-14 (×2): 30 mg/h via INTRAVENOUS
  Filled 2020-03-13 (×4): qty 200

## 2020-03-13 MED ORDER — AMIODARONE LOAD VIA INFUSION
150.0000 mg | Freq: Once | INTRAVENOUS | Status: AC
  Administered 2020-03-13: 150 mg via INTRAVENOUS
  Filled 2020-03-13: qty 83.34

## 2020-03-13 NOTE — Progress Notes (Signed)
Peripherally Inserted Central Catheter Placement  The IV Nurse has discussed with the patient and/or persons authorized to consent for the patient, the purpose of this procedure and the potential benefits and risks involved with this procedure.  The benefits include less needle sticks, lab draws from the catheter, and the patient may be discharged home with the catheter. Risks include, but not limited to, infection, bleeding, blood clot (thrombus formation), and puncture of an artery; nerve damage and irregular heartbeat and possibility to perform a PICC exchange if needed/ordered by physician.  Alternatives to this procedure were also discussed.  Bard Power PICC patient education guide, fact sheet on infection prevention and patient information card has been provided to patient /or left at bedside.    PICC Placement Documentation  PICC Double Lumen 03/13/20 PICC Right Brachial 38 cm 0 cm (Active)  Indication for Insertion or Continuance of Line Vasoactive infusions 03/13/20 1642  Exposed Catheter (cm) 0 cm 03/13/20 1642  Site Assessment Clean;Dry;Intact 03/13/20 1642  Lumen #1 Status Flushed;Blood return noted;Saline locked 03/13/20 1642  Lumen #2 Status Flushed;Blood return noted;Saline locked 03/13/20 1642  Dressing Type Transparent;Securing device 03/13/20 1642  Dressing Status Clean;Dry;Intact 03/13/20 1642  Antimicrobial disc in place? Yes 03/13/20 1642  Safety Lock Not Applicable 03/13/20 1642  Dressing Change Due 03/20/20 03/13/20 1642       Romie Jumper 03/13/2020, 4:44 PM

## 2020-03-13 NOTE — Progress Notes (Signed)
  Chaplain On-Call responded to Order Requisition for a Bible and [employer] devotional literature.  Chaplain met patient and provided supportive listening and presence as patient described the fortunate timing by which she was able to have emergency surgery on last Sunday. Patient stated that she has received an ostomy bag and is seeking to adapt to that device.  Patient also stated that she is a prayer partner with a friend who is age 74, and for whom the patient reads the daily devotional from [employer].  Patient stated frequently the importance of her faith in coping with many life-altering medical events. Chaplain provided much spiritual and emotional support and prayer, which the patient appreciated.  Garfield Taffy Delconte M.Div., [facility]

## 2020-03-13 NOTE — Consult Note (Addendum)
WOC Nurse ostomy follow up Pt had ileostomy surgery performed yesterday. She lives alone and states there are no friends or family she wants to be present for ostomy teaching. Stoma type/location: Stoma is dark red and viable, above skin level,some old bloody clots and drainage surrounding the site, 1 1/4 inches,  Peristomal assessment: intact skin surrounding Output: small amt liquid brown stool Ostomy pouching: 2pc.  Education provided: Demonstrated pouch change using 2 piece pouch and added barrier ring to attempt to maintain a seal.  Pt watched the process in a hand held mirror and asked appropriate questions.  She was able to open and close the pouch to empty. Reviewed pouching routines and ordering supplies.  Educational materials left at the bedside and extra pouches/wafers/barrier rings. Will perform another teaching session on Thurs.  Pt could benefit from home health assistance after discharge.  Enrolled patient in [facility] [employer] Discharge program: Yes; ordered one piece precut pouches for samples at for patient's ease of use at home.  Cammie Mcgee MSN, RN, CWOCN, [place], CNS [phone]

## 2020-03-13 NOTE — Progress Notes (Signed)
   03/13/20 0059  Assess: MEWS Score  Temp 98.2 F (36.8 C)  BP 100/76  Pulse Rate (!) 104  Resp 20  SpO2 98 %  O2 Device Nasal Cannula  Assess: MEWS Score  MEWS Temp 0  MEWS Systolic 1  MEWS Pulse 1  MEWS RR 0  MEWS LOC 0  MEWS Score 2  MEWS Score Color Yellow  Assess: if the MEWS score is Yellow or Red  Were vital signs taken at a resting state? Yes  Focused Assessment No change from prior assessment  Early Detection of Sepsis Score *See Row Information* Medium  MEWS guidelines implemented *See Row Information* No, previously yellow, continue vital signs every 4 hours  Notify: Provider  Provider Name/Title Manuela Schwartz  Date Provider Notified 03/13/20  Time Provider Notified 0115  Notification Type Page  Notification Reason Other (Comment) (HR fluctuating)  Response See new orders  Date of Provider Response 03/13/20  Time of Provider Response 0126   Received a call from telemetry that patient's HR sustained in the 150s for a few minutes. NP notified and a STAT CXR and BNP have been ordered. Pt resting in bed with no complaints.

## 2020-03-13 NOTE — Progress Notes (Signed)
NP notified of CXR and BNP results. New orders acknowledged.

## 2020-03-13 NOTE — Progress Notes (Signed)
PT Cancellation Note  Patient Details Name: [name] MRN: [id number] DOB: 07-06-1945   Cancelled Treatment:    Reason Eval/Treat Not Completed: Medical issues which prohibited therapy.  Pt currently with tachycardia upon arrival to room with nursing giving medicine to control HR.  Pt will be evaluated when medically appropriate.   Nolon Bussing, PT, DPT 03/13/20, 11:16 AM

## 2020-03-13 NOTE — Progress Notes (Signed)
6834 - secure chat to Dr. Denton Lank, patient tachycardic with sustained rate of 140s-150s. MD to consult cardiology.   1962- secure chat to Dr. Denton Lank, tele called to report patient with sustained rate of 150s to 160s with rate as high as 180. Amiodarone drip ordered, MD notified patient will require higher level of care for drip. Transfer order placed.   1011- Diltiazem 10 mg given IV at this time.   1047- Report called to accepting telemetry nurse, patient transported via bed with all personal belongings.

## 2020-03-13 NOTE — Anesthesia Postprocedure Evaluation (Signed)
Anesthesia Post Note  Patient: [name]  Procedure(s) Performed: EXPLORATORY LAPAROTOMY (N/A ) COLECTOMY WITH COLOSTOMY CREATION/HARTMANN PROCEDURE (N/A Abdomen)  Patient location during evaluation: PACU Anesthesia Type: General Level of consciousness: awake and alert Pain management: pain level controlled Vital Signs Assessment: post-procedure vital signs reviewed and stable Respiratory status: spontaneous breathing, nonlabored ventilation, respiratory function stable and patient connected to nasal cannula oxygen Cardiovascular status: blood pressure returned to baseline and stable Postop Assessment: no apparent nausea or vomiting Anesthetic complications: no   No complications documented.   Last Vitals:  Vitals:   03/13/20 0521 03/13/20 0735  BP: 105/89 117/83  Pulse: (!) 121 (!) 145  Resp: 19 20  Temp: 36.6 C 36.6 C  SpO2: 97% 98%    Last Pain:  Vitals:   03/13/20 0735  TempSrc: Oral  PainSc:                  Yevette Edwards

## 2020-03-13 NOTE — Progress Notes (Signed)
[facility] SURGICAL PROGRESS NOTE  Hospital Day(s): 2.   Post op day(s): 1 Day Post-Op.   Interval History:  Patient seen and examined Overnight had issues with tachycardia Patient reports she is doing okay, abdominal soreness diffusely, some subjective SOB No fever, chills, nausea, emesis Leukocytosis improving, down to 13.0K Hgb remains stable at 12.3 Renal function is normal, sCr - 0.92, UO - 1.1L Slightly acidotic with CO2 at 19 No significant electrolyte derangements She did have BNP overnight which was 981.4, it appears to have been this high in the past She had 200 ccs from ileostomy  Has not mobilized   Vital signs in last 24 hours: [min-max] current  Temp:  [97.3 F (36.3 C)-98.4 F (36.9 C)] 97.9 F (36.6 C) (10/12 0521) Pulse Rate:  [80-124] 121 (10/12 0521) Resp:  [14-29] 19 (10/12 0521) BP: (86-122)/(55-100) 105/89 (10/12 0521) SpO2:  [93 %-100 %] 97 % (10/12 0521) Weight:  [63.5 kg] 63.5 kg (10/11 1109)     Height: 5\' 5"  (165.1 cm) Weight: 63.5 kg BMI (Calculated): 23.3   Intake/Output last 2 shifts:  10/11 0701 - 10/12 0700 In: 3000 [P.O.:500; I.V.:1300; IV Piggyback:1200] Out: 1650 [Urine:1100; Stool:200; Blood:350]   Physical Exam:  Constitutional: alert, cooperative and no distress  Respiratory: breathing non-labored at rest, on Rea Cardiovascular: Irregularly irregular, tachycardic  Gastrointestinal: Soft, diffuse soreness, non-distended, no rebound/guarding. 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   Labs:  CBC Latest Ref Rng & Units 03/13/2020 03/12/2020 03/11/2020  WBC 4.0 - 10.5 K/uL 13.0(H) 15.8(H) 13.3(H)  Hemoglobin 12.0 - 15.0 g/dL 05/11/2020 46.5 15.3(H)  Hematocrit 36 - 46 % 37.1 41.1 46.3(H)  Platelets 150 - 400 K/uL 180 253 290   CMP Latest Ref Rng & Units 03/13/2020  03/12/2020 03/11/2020  Glucose 70 - 99 mg/dL 87 05/11/2020) 275(T)  BUN 8 - 23 mg/dL 23 700(F) 22  Creatinine 0.44 - 1.00 mg/dL 74(B 4.49 6.75  Sodium 135 - 145 mmol/L 132(L) 128(L) 126(L)  Potassium 3.5 - 5.1 mmol/L 3.8 4.2 4.6  Chloride 98 - 111 mmol/L 103 99 91(L)  CO2 22 - 32 mmol/L 19(L) 19(L) 22  Calcium 8.9 - 10.3 mg/dL 7.8(L) 8.0(L) 9.2  Total Protein 6.5 - 8.1 g/dL - - 8.1  Total Bilirubin 0.3 - 1.2 mg/dL - - 1.5(H)  Alkaline Phos 38 - 126 U/L - - 83  AST 15 - 41 U/L - - 30  ALT 0 - 44 U/L - - 20     Imaging studies:   CXR (03/13/2020) personally reviewed showing cardiomegaly, and radiologist report reviewed:  IMPRESSION: Cardiomegaly with small pleural effusions.   Assessment/Plan:  74 y.o. 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   - Okay for CLD   - Continue IVF resusctiation  - Monitor abdominal examination  - Monitor on-going ileostomy function closely; once ileostomy function consistent she will most likely need anti-diarrheals  - Pain control prn; antiemetics prn  - Ask WOC RN for ostomy teaching assistance  - Monitor leukocytosis, renal function, electrolytes  - ? Involve cardiology secondary to history of Afib/CHF, has heart monitor currently  - Mobilization as feasible; would not hesitate to engage PT   - Further management  per primary service; we will follow    All of the above findings and recommendations were discussed with the patient, and the medical team, and all of patient's questions were answered to her expressed satisfaction.  -- Lynden Oxford, PA-C [facility] 03/13/2020, 7:15 AM [phone] M-F: 7am - 4pm

## 2020-03-13 NOTE — Progress Notes (Signed)
VAST consulted to obtain IV access. Upon arrival at bedside, noted current IV in LAC to be bleeding out from under dressing. Dressing changed. Amiodarone infusing well.  Assessed pt's arms bilaterally utilizing [place]; pt with extremely poor vasculature. Pt in need of at least 2 working PIV's. VAST RN successful in placing one USGIV in left arm.  Noted pt's IV in LAC continuing to bleed beneath dressing. Amiodarone infusing.  Contacted Dr. Denton Lank via SecureChat and updated; requested a PICC be ordered for poor vasculature.  PICC ordered and unit RN notified.

## 2020-03-13 NOTE — Progress Notes (Addendum)
PROGRESS NOTE    [name]   WUJ:[id number]  DOB: [birth year]  PCP: Lauren Fuchs, MD    DOA: 03/11/2020 LOS: 2   Brief Narrative   [name] is a 74 y.o. female with medical history significant for chronic dCHF, A-fib on Eliquis, history of CVA, hypothyroidism, iron deficiency anemia, history of diverticulitis who presented to the ED on 03/11/20 with nausea, vomiting and abdominal pain and worsening abdominal distention, and no bowel movement in 5 days.  Onset was in the setting of a spicy meal she'd eaten four days prior and subsequently had few days of nausea/vomiting.    She recently had diverticulitis and underwent a colonoscopy in August due to concerns of possible sigmoid obstruction.  Colonoscopy showed only significant sigmoid edema at that time.  In the ED, afebrile, tachycardic and tachypneic.  Labs significant for leukocytosis of 13.3, sodium 126.  CT abdomen pelvis show moderate diffuse colonic distention with a degree of obstruction at the sigmoid colon possibly secondary to muscular hypertrophy versus stricture.  No active diverticulitis.  General surgery consulted.  On 10/11, patient had Gastrografin enema which showed complete colonic obstruction at the level of the mid to distal sigmoid colon.  Patient was taken to surgery, underwent subtotal colectomy.     Assessment & Plan   Principal Problem:   Large bowel obstruction (HCC) Active Problems:   Atrial fibrillation (HCC)   Abdominal distention   Hyponatremia   Chronic diastolic heart failure (HCC)   Hypothyroidism   Large Bowel Obstruction - present on admission with 5 days constipation, diffuse abdominal pain, worsening distention and obstruction confirmed by Gastrografin enema on 10/11.  Underwent subtotal colectomy on 10/11 with Dr. Toula Moos. 10/12: POD 1 - pt doing well, some ileostomy output, started on clears by surgery --Surgery following --clear liquids, advance diet per surgery --IV  fluids --PRN antiemetics and pain control  --Zosyn d/c'd per surgery --monitor abdominal exam & ileostomy function closely --hold Eliquis, SCD's for VTE ppx for now  Hypotension - Improved.  Post-operatively and overnight 10/11-12 had low BP's but maintained MAP's.   Suspect due to anesthesia medications and hypovolemia.  Responded to fluid bolus in PACU. --Maintain MAP>=65 with IV fluids --If worsening or persistent despite fluids >> ICU for vasopressors  Hyponatremia - secondary to hypovolemia from decrease PO intake.  Improving with IV fluids. Na 126 on admission >> 128.   --Follow BMP --Continue IV fluids  Atrial fibrillation - A fib with RVR in post-op setting - expect rate to improve with hydration and improved BP, resumption of PO medications.   --Cardiology consulted today, Dr. Juliann Pares --Transfer to PCU for drip --Amiodarone gtt for rate control given soft BP's --Hold Eliquis --Continue PO Cardizem and metoprolol --PRN IV Lopressor for sustained HR's > 110 bpm for now  Chronic diastolic heart failure - compensated on admission, euvolemic.   --Hold Lasix given hyponatremia.   --Monitor volume status closely with IV fluids.    Hypothyroidism - continue levothyroxine    DVT prophylaxis: SCD's Start: 03/12/20 1607 Place and maintain sequential compression device Start: 03/12/20 0202   Diet:  Diet Orders (From admission, onward)    Start     Ordered   03/12/20 1607  Diet clear liquid Room service appropriate? Yes; Fluid consistency: Thin  Diet effective now       Question Answer Comment  Room service appropriate? Yes   Fluid consistency: Thin      03/12/20 1607  Code Status: DNR    Subjective 03/13/20    Pt seen at bedside this AM.  HR's have been uncontrolled overnight and this AM.  Pt denies chest pain, palpitations or shortness of breath.  Says her pain is currently controlled.  No nausea or vomiting.  So far tolerating clear liquids and oral  meds.  No other acute complaints.    Disposition Plan & Communication   Status is: Inpatient  Remains inpatient appropriate because:IV treatments appropriate due to intensity of illness or inability to take PO   Dispo: The patient is from: Home              Anticipated d/c is to: Home              Anticipated d/c date is: 3 days              Patient currently is not medically stable to d/c.   Family Communication: none at bedside, will attempt to call.  Family has been updated by surgery team.    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  Antimicrobials:  Anti-infectives (From admission, onward)   Start     Dose/Rate Route Frequency Ordered Stop   03/12/20 2200  cefoTEtan (CEFOTAN) 2 g in sodium chloride 0.9 % 100 mL IVPB        2 g 200 mL/hr over 30 Minutes Intravenous Every 12 hours 03/12/20 1607 03/12/20 2244   03/12/20 1200  cefoTEtan (CEFOTAN) 2 g in sodium chloride 0.9 % 100 mL IVPB        2 g 200 mL/hr over 30 Minutes Intravenous  Once 03/12/20 1131 03/12/20 1300   03/12/20 1130  sodium chloride 0.9 % with cefoTEtan (CEFOTAN) ADS Med       Note to Pharmacy: Mikey Bussing   : cabinet override      03/12/20 1130 03/12/20 1200   03/12/20 0945  piperacillin-tazobactam (ZOSYN) IVPB 3.375 g  Status:  Discontinued        3.375 g 12.5 mL/hr over 240 Minutes Intravenous Every 8 hours 03/12/20 0940 03/12/20 1607        Objective   Vitals:   03/13/20 1130 03/13/20 1138 03/13/20 1146 03/13/20 1200  BP: 98/87  131/74 107/89  Pulse: (!) 146 (!) 126 (!) 135 (!) 137  Resp: (!) 22 (!) 22  (!) 21  Temp: 98.2 F (36.8 C)  98.1 F (36.7 C)   TempSrc:   Oral   SpO2: 96%   97%  Weight:      Height:        Intake/Output Summary (Last 24 hours) at 03/13/2020 1417 Last data filed at 03/13/2020 1330 Gross per 24 hour  Intake 1780 ml  Output 1650 ml  Net 130 ml   Filed Weights   03/11/20 1325  03/12/20 1109  Weight: 63.5 kg 63.5 kg    Physical Exam:  General exam: awake, alert, no acute distress Respiratory system: CTAB, no wheezes, rales or rhonchi, normal respiratory effort. Cardiovascular system: normal S1/S2, irregularly irregular, no pedal edema.   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 no gross focal neurologic deficits, normal speech Extremities: moves all, no edema, SCD's on LE's Psychiatry: normal mood, congruent affect  Labs   Data Reviewed: I have personally reviewed following labs and imaging studies  CBC:  Recent Labs  Lab 03/11/20 1335 03/12/20 0445 03/13/20 0303  WBC 13.3* 15.8* 13.0*  HGB 15.3* 13.7 12.3  HCT 46.3* 41.1 37.1  MCV 79.3* 80.1 81.0  PLT 290 253 180   Basic Metabolic Panel: Recent Labs  Lab 03/11/20 1335 03/12/20 0445 03/13/20 0303  NA 126* 128* 132*  K 4.6 4.2 3.8  CL 91* 99 103  CO2 22 19* 19*  GLUCOSE 125* 109* 87  BUN 22 26* 23  CREATININE 0.98 0.68 0.92  CALCIUM 9.2 8.0* 7.8*  MG  --   --  2.1  PHOS  --   --  4.5   GFR: Estimated Creatinine Clearance: 46.8 mL/min (by C-G formula based on SCr of 0.92 mg/dL). Liver Function Tests: Recent Labs  Lab 03/11/20 1335  AST 30  ALT 20  ALKPHOS 83  BILITOT 1.5*  PROT 8.1  ALBUMIN 4.1   Recent Labs  Lab 03/11/20 1335  LIPASE 27   No results for input(s): AMMONIA in the last 168 hours. Coagulation Profile: No results for input(s): INR, PROTIME in the last 168 hours. Cardiac Enzymes: No results for input(s): CKTOTAL, CKMB, CKMBINDEX, TROPONINI in the last 168 hours. BNP (last 3 results) No results for input(s): PROBNP in the last 8760 hours. HbA1C: No results for input(s): HGBA1C in the last 72 hours. CBG: Recent Labs  Lab 03/13/20 0748  GLUCAP 85   Lipid Profile: No results for input(s): CHOL, HDL,  LDLCALC, TRIG, CHOLHDL, LDLDIRECT in the last 72 hours. Thyroid Function Tests: Recent Labs    03/13/20 0303  TSH 1.828   Anemia Panel: No results for input(s): VITAMINB12, FOLATE, FERRITIN, TIBC, IRON, RETICCTPCT in the last 72 hours. Sepsis Labs: Recent Labs  Lab 03/11/20 1957 03/12/20 0445  LATICACIDVEN 1.4 1.1    Recent Results (from the past 240 hour(s))  Respiratory Panel by RT PCR (Flu A&B, Covid) - Nasopharyngeal Swab     Status: None   Collection Time: 03/12/20  1:58 AM   Specimen: Nasopharyngeal Swab  Result Value Ref Range Status   SARS Coronavirus 2 by RT PCR NEGATIVE NEGATIVE Final    Comment: (NOTE) SARS-CoV-2 target nucleic acids are NOT DETECTED.  The SARS-CoV-2 RNA is generally detectable in upper respiratoy specimens during the acute phase of infection. The lowest concentration of SARS-CoV-2 viral copies this assay can detect is 131 copies/mL. A negative result does not preclude SARS-Cov-2 infection and should not be used as the sole basis for treatment or other patient management decisions. A negative result may occur with  improper specimen collection/handling, submission of specimen other than nasopharyngeal swab, presence of viral mutation(s) within the areas targeted by this assay, and inadequate number of viral copies (<131 copies/mL). A negative result must be combined with clinical observations, patient history, and epidemiological information. The expected result is Negative.  Fact Sheet for Patients:  https://www.moore.com/  Fact Sheet for Healthcare Providers:  https://www.young.biz/  This test is no t yet approved or cleared by the [place] FDA and  has been authorized for detection and/or diagnosis of SARS-CoV-2 by FDA under an Emergency Use Authorization (EUA). This EUA will remain  in effect (meaning this test can be used) for the duration of the COVID-19 declaration under Section 564(b)(1) of  the Act, 21 U.S.C. section 360bbb-3(b)(1), unless the authorization is terminated or revoked sooner.     Influenza A by PCR NEGATIVE NEGATIVE Final   Influenza B by PCR NEGATIVE NEGATIVE Final    Comment: (NOTE) The Xpert Xpress  SARS-CoV-2/FLU/RSV assay is intended as an aid in  the diagnosis of influenza from Nasopharyngeal swab specimens and  should not be used as a sole basis for treatment. Nasal washings and  aspirates are unacceptable for Xpert Xpress SARS-CoV-2/FLU/RSV  testing.  Fact Sheet for Patients: https://www.moore.com/  Fact Sheet for Healthcare Providers: https://www.young.biz/  This test is not yet approved or cleared by the [place] FDA and  has been authorized for detection and/or diagnosis of SARS-CoV-2 by  FDA under an Emergency Use Authorization (EUA). This EUA will remain  in effect (meaning this test can be used) for the duration of the  Covid-19 declaration under Section 564(b)(1) of the Act, 21  U.S.C. section 360bbb-3(b)(1), unless the authorization is  terminated or revoked. Performed at [facility], [address], [place], [place] [address]       Imaging Studies   DG Chest 1 View  Result Date: 03/13/2020 CLINICAL DATA:  Tachycardia EXAM: CHEST  1 VIEW COMPARISON:  02/22/2020 FINDINGS: There is cardiomegaly with small pleural effusions. No overt pulmonary edema. IMPRESSION: Cardiomegaly with small pleural effusions. Electronically Signed   By: Deatra Robinson M.D.   On: 03/13/2020 03:27   CT ABDOMEN PELVIS W CONTRAST  Result Date: 03/11/2020 CLINICAL DATA:  74 year old female with abdominal distension. EXAM: CT ABDOMEN AND PELVIS WITH CONTRAST TECHNIQUE: Multidetector CT imaging of the abdomen and pelvis was performed using the standard protocol following bolus administration of intravenous contrast. CONTRAST:  OMNIPAQUE IOHEXOL 300 MG/ML  SOLN COMPARISON:  CT abdomen pelvis dated  12/21/2019. FINDINGS: Lower chest: Trace left and small right pleural effusions. Patchy right lung base consolidation, likely atelectasis. Pneumonia is not excluded clinical correlation is recommended. There is moderate cardiomegaly with biatrial dilatation. There is retrograde flow of contrast from the right atrium into the IVC suggestive of a degree of right heart dysfunction. There is calcification of the mitral annulus. No intra-abdominal free air. Trace free fluid in the pelvis. Hepatobiliary: There is mild irregularity of the liver contour suggestive of early changes of cirrhosis. Bilobed or 2 adjacent cysts in the left lobe of the liver with combined dimension of 2 cm. No intrahepatic biliary ductal dilatation. Cholecystectomy. No retained calcified stone noted in the central CBD. Pancreas: No acute findings. Spleen: Normal in size without focal abnormality. Adrenals/Urinary Tract: The adrenal glands unremarkable. Mild bilateral parenchyma atrophy. There is no hydronephrosis on either side. There is symmetric enhancement and excretion of contrast by both kidneys. The visualized ureters and urinary bladder appear unremarkable. Stomach/Bowel: There is sigmoid diverticulosis with muscular hypertrophy. There is moderate diffuse colonic distension with air extending to the level of the sigmoid colon suggestive of a degree of obstruction of the sigmoid colon, likely related to muscular hypertrophy or stricture. Underlying mass is not excluded. Clinical correlation and follow-up recommended. Scattered tiny pockets of air along the wall of the cecum and proximal colon likely mixed with the colonic content and less likely pneumatosis. Correlation with lactic acid recommended to exclude bowel ischemia. There is a small hiatal hernia. No evidence of small-bowel obstruction. Vascular/Lymphatic: Mild aortoiliac atherosclerotic disease. The IVC is unremarkable. No portal venous gas. There is no adenopathy. Reproductive:  The uterus is grossly unremarkable. Other: Small fat containing umbilical hernia. Musculoskeletal: No acute or significant osseous findings. IMPRESSION: 1. Moderate diffuse colonic distension likely related to a degree of obstruction of the sigmoid colon, secondary to muscular hypertrophy or stricture. Underlying mass is not excluded. Clinical correlation and follow-up recommended. 2. Sigmoid diverticulosis  with muscular hypertrophy. No active inflammatory changes. 3. Moderate cardiomegaly with biatrial dilatation. 4. Trace left and small right pleural effusions. Patchy right lung base consolidation, likely atelectasis. Pneumonia is not excluded. 5. Aortic Atherosclerosis (ICD10-I70.0). Electronically Signed   By: Elgie CollardArash  Radparvar M.D.   On: 03/11/2020 19:45   DG Abd 2 Views  Result Date: 03/11/2020 CLINICAL DATA:  Abdominal pain EXAM: ABDOMEN - 2 VIEW COMPARISON:  December 12, 2019 FINDINGS: There is marked gaseous dilation of loops colon. The cecum measures approximately 13 cm. No definitive free air. There are nonspecific air-fluid levels on upright radiographs. Small amount of air seen within the rectum. Bibasilar heterogeneous opacities, nonspecific. Surgical clips project over the upper abdomen. Degenerative changes of the lower lumbar spine. IMPRESSION: 1. Marked gaseous dilation of loops of colon. Small amount of air seen within the rectum. Findings are concerning for colonic obstruction versus ileus. Recommend further evaluation with dedicated contrast enhanced CT. 2.  Bibasilar heterogeneous opacities, nonspecific. Electronically Signed   By: Meda KlinefelterStephanie  Peacock MD   On: 03/11/2020 15:37   DG BE (COLON)W SINGLE CM (SOL OR THIN BA)  Result Date: 03/12/2020 CLINICAL DATA:  Evaluate for colonic obstruction EXAM: SINGLE CONTRAST ENEMA TECHNIQUE: Initial scout AP supine abdominal image obtained. Contrast was introduced into the colon in a retrograde fashion and spot images were obtained. Dilute Omnipaque  300 contrast media was used. FLUOROSCOPY TIME:  Fluoroscopy Time:  1.2 minutes Radiation Exposure Index (if provided by the fluoroscopic device): 28.4 mGy Number of Acquired Spot Images: 12 COMPARISON:  CT 03/11/2020 FINDINGS: Single contrast enema was performed utilizing dilute Omnipaque 300. Contrast filled the rectum and distal sigmoid colon. There is abrupt luminal obstruction the level of the mid to distal sigmoid colon corresponding to site of high-grade luminal narrowing seen on CT. No contrast was able to traverse this level. No extraluminal contrast was visualized. Patient tolerated the procedure well. IMPRESSION: Complete colonic obstruction at the level of the mid to distal sigmoid colon. These results were called by telephone at the conclusion of the exam on 03/12/2020 at 10:53 a.m. to provider Lynden OxfordZachary Schulz, PA, who verbally acknowledged these results. Electronically Signed   By: Duanne GuessNicholas  Plundo D.O.   On: 03/12/2020 11:39   US EKG SITE RITE  Result Date: 03/13/2020 If Site Rite image not attached, placement could not be confirmed due to current cardiac rhythm.    Medications   Scheduled Meds: . Chlorhexidine Gluconate Cloth  6 each Topical Daily  . diltiazem  240 mg Oral Daily  . folic acid  1 mg Oral Daily  . levothyroxine  75 mcg Oral QAC breakfast  . metoprolol succinate  50 mg Oral Daily  . pantoprazole  40 mg Oral Daily  . potassium chloride  10 mEq Oral Daily   Continuous Infusions: . acetaminophen 1,000 mg (03/13/20 0532)  . amiodarone 60 mg/hr (03/13/20 1121)   Followed by  . amiodarone         LOS: 2 days    Time spent: 35 minutes with > 50% spent in coordination of care and direct patient contact.    Pennie BanterKelly A Somalia Segler, DO [facility]  03/13/2020, 2:17 PM    If 7PM-7AM, please contact night-coverage. How to contact the [facility] Attending or Consulting provider 7A - 7P or covering provider during after hours 7P -7A, for this patient?    1. Check  the care team in [facility] and look for a) attending/consulting [facility] provider listed and b) the [facility] team listed  2. Log into [email or web address] and use [place]'s universal password to access. If you do not have the password, please contact the hospital operator. 3. Locate the [facility] provider you are looking for under [facility] and page to a number that you can be directly reached. 4. If you still have difficulty reaching the provider, please page the [facility] (Director on Call) for the Hospitalists listed on amion for assistance.

## 2020-03-13 NOTE — Progress Notes (Signed)
Cross Cover Note Nurse reports patient with tachycardia.  Blood pressures als0 noted to be soft.  Chest x ray shows cardiomegaly and small pleural effusions. No overt pulmonary edema.  BNP 981.4   Blood  pressures soft  WBC continues to improve.  Hold adding diuresis due to low soft preessures.  Monitor I & O. Hold additional IV fluids. Metoprolol IV only if heart rate ets above 150

## 2020-03-13 NOTE — Consult Note (Signed)
CARDIOLOGY CONSULT NOTE               Patient ID: [name] MRN: [id number] DOB/AGE: January 20, 1946 74 y.o.  Admit date: 03/11/2020 Referring Physician Dr Benita Gutter hospitalist Primary Physician Pilar Grammes MD Primary Cardiologist AP Reason for Consultation atrial fibrillation rapid ventricular response. From: Surgery  HPI: Patient is a 75 year old female presented with some form of abdominal obstruction requiring emergent surgery to relieve the patient developed atrial fibrillation denies any significant palpitations or tachycardia recently had abdominal surgery for obstruction and has a colostomy now here for follow-up patient usually follows with Dr. Darrold Junker. Because of the rapid nature of the patient's heart rate she was placed on telemetry and short-term anticoagulation with heparin with anticipation of transitioning to Eliquis.  Review of systems complete and found to be negative unless listed above     Past Medical History:  Diagnosis Date  . Acute respiratory failure (HCC)    Secondary to aspiration pneumonia   . Altered mental status    Secondary to viral herpes simplex virus encephalitis  . Anemia   . Atrial fibrillation (HCC)   . Bilateral pneumonia   . CHF (congestive heart failure) (HCC)   . Dysphasia   . Encephalitis   . Hyperglycemia   . Hypertension   . Hyponatremia   . IBS (irritable bowel syndrome)   . Seizures (HCC)   . Stroke [facility])     Past Surgical History:  Procedure Laterality Date  . CHOLECYSTECTOMY    . COLECTOMY WITH COLOSTOMY CREATION/HARTMANN PROCEDURE N/A 03/12/2020   Procedure: COLECTOMY WITH COLOSTOMY CREATION/HARTMANN PROCEDURE;  Surgeon: Campbell Lerner, MD;  Location: [facility] ORS;  Service: General;  Laterality: N/A;  . COLONOSCOPY N/A 01/17/2020   Procedure: COLONOSCOPY;  Surgeon: Regis Bill, MD;  Location: [facility] ENDOSCOPY;  Service: Endoscopy;  Laterality: N/A;  . COLONOSCOPY WITH PROPOFOL N/A 12/31/2016   Procedure:  COLONOSCOPY WITH PROPOFOL;  Surgeon: Scot Jun, MD;  Location: [facility] ENDOSCOPY;  Service: Endoscopy;  Laterality: N/A;  . ESOPHAGOGASTRODUODENOSCOPY (EGD) WITH PROPOFOL N/A 12/31/2016   Procedure: ESOPHAGOGASTRODUODENOSCOPY (EGD) WITH PROPOFOL;  Surgeon: Scot Jun, MD;  Location: [facility] ENDOSCOPY;  Service: Endoscopy;  Laterality: N/A;  . LAPAROTOMY N/A 03/12/2020   Procedure: EXPLORATORY LAPAROTOMY;  Surgeon: Campbell Lerner, MD;  Location: [facility] ORS;  Service: General;  Laterality: N/A;  . TONSILLECTOMY    . TUBAL LIGATION      Medications Prior to Admission  Medication Sig Dispense Refill Last Dose  . apixaban (ELIQUIS) 5 MG TABS tablet Take 1 tablet (5 mg total) by mouth 2 (two) times daily. 60 tablet 0 03/11/2020 at 0715  . Calcium Carb-Cholecalciferol (CALCIUM-VITAMIN D) 500-200 MG-UNIT tablet Take 1 tablet by mouth 2 (two) times daily.   03/11/2020 at 0715  . dicyclomine (BENTYL) 10 MG capsule Take 10 mg by mouth every 6 (six) hours as needed.   prn at prn  . diltiazem (CARDIZEM CD) 240 MG 24 hr capsule Take 1 capsule (240 mg total) by mouth daily. 30 capsule 0 03/11/2020 at 0715  . folic acid (FOLVITE) 1 MG tablet Take 1 mg by mouth daily. Per tube once daily     03/11/2020 at 0715  . furosemide (LASIX) 20 MG tablet Take 20 mg by mouth daily.   03/11/2020 at 0715  . hydrocortisone (ANUSOL-HC) 2.5 % rectal cream Apply 1 application topically 2 (two) times daily as needed.   prn at prn  . levothyroxine (SYNTHROID) 75 MCG tablet Take 75 mcg  by mouth daily before breakfast.    03/11/2020 at 0715  . metoprolol succinate (TOPROL-XL) 25 MG 24 hr tablet Take 50 mg by mouth at bedtime.    Unknown at Unknown  . nitroGLYCERIN (NITROSTAT) 0.4 MG SL tablet Place 0.4 mg under the tongue every 5 (five) minutes as needed.    prn at prn  . omeprazole (PRILOSEC) 20 MG capsule Take 20 mg by mouth daily.   03/11/2020 at 0715  . potassium chloride (KLOR-CON) 10 MEQ tablet Take 10 mEq by mouth  daily.   03/11/2020 at 1715  . NON FORMULARY Take 1 tablet by mouth daily.      Social History   Socioeconomic History  . Marital status: Widowed    Spouse name: Not on file  . Number of children: 2  . Years of education: Not on file  . Highest education level: Not on file  Occupational History  . Occupation: medical transcript  Tobacco Use  . Smoking status: Never Smoker  . Smokeless tobacco: Never Used  Vaping Use  . Vaping Use: Never used  Substance and Sexual Activity  . Alcohol use: No  . Drug use: No  . Sexual activity: Never  Other Topics Concern  . Not on file  Social History Narrative  . Not on file   Social Determinants of Health   Financial Resource Strain: Low Risk   . Difficulty of Paying Living Expenses: Not hard at all  Food Insecurity: No Food Insecurity  . Worried About Programme researcher, broadcasting/film/video in the Last Year: Never true  . Ran Out of Food in the Last Year: Never true  Transportation Needs: No Transportation Needs  . Lack of Transportation (Medical): No  . Lack of Transportation (Non-Medical): No  Physical Activity: Insufficiently Active  . Days of Exercise per Week: 2 days  . Minutes of Exercise per Session: 30 min  Stress: No Stress Concern Present  . Feeling of Stress : Not at all  Social Connections: Moderately Isolated  . Frequency of Communication with Friends and Family: More than three times a week  . Frequency of Social Gatherings with Friends and Family: More than three times a week  . Attends Religious Services: 1 to 4 times per year  . Active Member of Clubs or Organizations: No  . Attends Banker Meetings: Never  . Marital Status: Widowed  Intimate Partner Violence: Not At Risk  . Fear of Current or Ex-Partner: No  . Emotionally Abused: No  . Physically Abused: No  . Sexually Abused: No    Family History  Problem Relation Age of Onset  . Prostate cancer Father   . Breast cancer Maternal Aunt   . Breast cancer Maternal  Aunt   . Cerebral palsy Son       Review of systems complete and found to be negative unless listed above      PHYSICAL EXAM  General: Well developed, well nourished, in no acute distress HEENT:  Normocephalic and atramatic Neck:  No JVD.  Lungs: Clear bilaterally to auscultation and percussion. Heart: HRRR . Normal S1 and S2 without gallops or murmurs.  Abdomen: Bowel sounds are positive, abdomen soft and non-tender  Msk:  Back normal, normal gait. Normal strength and tone for age. Extremities: No clubbing, cyanosis or edema.   Neuro: Alert and oriented X 3. Psych:  Good affect, responds appropriately  Labs:   Lab Results  Component Value Date   WBC 13.0 (H) 03/13/2020   HGB  12.3 03/13/2020   HCT 37.1 03/13/2020   MCV 81.0 03/13/2020   PLT 180 03/13/2020    Recent Labs  Lab 03/11/20 1335 03/12/20 0445 03/13/20 0303  NA 126*   < > 132*  K 4.6   < > 3.8  CL 91*   < > 103  CO2 22   < > 19*  BUN 22   < > 23  CREATININE 0.98   < > 0.92  CALCIUM 9.2   < > 7.8*  PROT 8.1  --   --   BILITOT 1.5*  --   --   ALKPHOS 83  --   --   ALT 20  --   --   AST 30  --   --   GLUCOSE 125*   < > 87   < > = values in this interval not displayed.   Lab Results  Component Value Date   CKTOTAL 70 08/29/2011   CKMB 3.3 08/29/2011   TROPONINI 0.07 (HH) 07/05/2017    Lab Results  Component Value Date   CHOL 159 03/17/2019   CHOL 222 (H) 08/28/2011   Lab Results  Component Value Date   HDL 46 03/17/2019   HDL 55 08/28/2011   Lab Results  Component Value Date   LDLCALC 92 03/17/2019   LDLCALC 142 (H) 08/28/2011   Lab Results  Component Value Date   TRIG 103 03/17/2019   TRIG 127 08/28/2011   Lab Results  Component Value Date   CHOLHDL 3.5 03/17/2019   No results found for: LDLDIRECT    Radiology: DG Chest 1 View  Result Date: 03/13/2020 CLINICAL DATA:  Tachycardia EXAM: CHEST  1 VIEW COMPARISON:  02/22/2020 FINDINGS: There is cardiomegaly with small pleural  effusions. No overt pulmonary edema. IMPRESSION: Cardiomegaly with small pleural effusions. Electronically Signed   By: Deatra Robinson M.D.   On: 03/13/2020 03:27   DG Chest 2 View  Result Date: 02/22/2020 CLINICAL DATA:  Shortness of breath EXAM: CHEST - 2 VIEW COMPARISON:  12/04/2019 FINDINGS: Cardiac shadow is stable. Aortic calcifications are noted. Bibasilar atelectatic changes are again seen with small effusions stable from prior exam. Mild vascular congestion is seen. No bony abnormality is noted. IMPRESSION: Stable bibasilar atelectasis/scarring. Increased vascular congestion Electronically Signed   By: Alcide Clever M.D.   On: 02/22/2020 01:38   CT ABDOMEN PELVIS W CONTRAST  Result Date: 03/11/2020 CLINICAL DATA:  74 year old female with abdominal distension. EXAM: CT ABDOMEN AND PELVIS WITH CONTRAST TECHNIQUE: Multidetector CT imaging of the abdomen and pelvis was performed using the standard protocol following bolus administration of intravenous contrast. CONTRAST:  OMNIPAQUE IOHEXOL 300 MG/ML  SOLN COMPARISON:  CT abdomen pelvis dated 12/21/2019. FINDINGS: Lower chest: Trace left and small right pleural effusions. Patchy right lung base consolidation, likely atelectasis. Pneumonia is not excluded clinical correlation is recommended. There is moderate cardiomegaly with biatrial dilatation. There is retrograde flow of contrast from the right atrium into the IVC suggestive of a degree of right heart dysfunction. There is calcification of the mitral annulus. No intra-abdominal free air. Trace free fluid in the pelvis. Hepatobiliary: There is mild irregularity of the liver contour suggestive of early changes of cirrhosis. Bilobed or 2 adjacent cysts in the left lobe of the liver with combined dimension of 2 cm. No intrahepatic biliary ductal dilatation. Cholecystectomy. No retained calcified stone noted in the central CBD. Pancreas: No acute findings. Spleen: Normal in size without focal  abnormality. Adrenals/Urinary Tract: The adrenal  glands unremarkable. Mild bilateral parenchyma atrophy. There is no hydronephrosis on either side. There is symmetric enhancement and excretion of contrast by both kidneys. The visualized ureters and urinary bladder appear unremarkable. Stomach/Bowel: There is sigmoid diverticulosis with muscular hypertrophy. There is moderate diffuse colonic distension with air extending to the level of the sigmoid colon suggestive of a degree of obstruction of the sigmoid colon, likely related to muscular hypertrophy or stricture. Underlying mass is not excluded. Clinical correlation and follow-up recommended. Scattered tiny pockets of air along the wall of the cecum and proximal colon likely mixed with the colonic content and less likely pneumatosis. Correlation with lactic acid recommended to exclude bowel ischemia. There is a small hiatal hernia. No evidence of small-bowel obstruction. Vascular/Lymphatic: Mild aortoiliac atherosclerotic disease. The IVC is unremarkable. No portal venous gas. There is no adenopathy. Reproductive: The uterus is grossly unremarkable. Other: Small fat containing umbilical hernia. Musculoskeletal: No acute or significant osseous findings. IMPRESSION: 1. Moderate diffuse colonic distension likely related to a degree of obstruction of the sigmoid colon, secondary to muscular hypertrophy or stricture. Underlying mass is not excluded. Clinical correlation and follow-up recommended. 2. Sigmoid diverticulosis with muscular hypertrophy. No active inflammatory changes. 3. Moderate cardiomegaly with biatrial dilatation. 4. Trace left and small right pleural effusions. Patchy right lung base consolidation, likely atelectasis. Pneumonia is not excluded. 5. Aortic Atherosclerosis (ICD10-I70.0). Electronically Signed   By: Elgie Collard M.D.   On: 03/11/2020 19:45   DG Abd 2 Views  Result Date: 03/11/2020 CLINICAL DATA:  Abdominal pain EXAM: ABDOMEN - 2  VIEW COMPARISON:  December 12, 2019 FINDINGS: There is marked gaseous dilation of loops colon. The cecum measures approximately 13 cm. No definitive free air. There are nonspecific air-fluid levels on upright radiographs. Small amount of air seen within the rectum. Bibasilar heterogeneous opacities, nonspecific. Surgical clips project over the upper abdomen. Degenerative changes of the lower lumbar spine. IMPRESSION: 1. Marked gaseous dilation of loops of colon. Small amount of air seen within the rectum. Findings are concerning for colonic obstruction versus ileus. Recommend further evaluation with dedicated contrast enhanced CT. 2.  Bibasilar heterogeneous opacities, nonspecific. Electronically Signed   By: Meda Klinefelter MD   On: 03/11/2020 15:37   DG BE (COLON)W SINGLE CM (SOL OR THIN BA)  Result Date: 03/12/2020 CLINICAL DATA:  Evaluate for colonic obstruction EXAM: SINGLE CONTRAST ENEMA TECHNIQUE: Initial scout AP supine abdominal image obtained. Contrast was introduced into the colon in a retrograde fashion and spot images were obtained. Dilute Omnipaque 300 contrast media was used. FLUOROSCOPY TIME:  Fluoroscopy Time:  1.2 minutes Radiation Exposure Index (if provided by the fluoroscopic device): 28.4 mGy Number of Acquired Spot Images: 12 COMPARISON:  CT 03/11/2020 FINDINGS: Single contrast enema was performed utilizing dilute Omnipaque 300. Contrast filled the rectum and distal sigmoid colon. There is abrupt luminal obstruction the level of the mid to distal sigmoid colon corresponding to site of high-grade luminal narrowing seen on CT. No contrast was able to traverse this level. No extraluminal contrast was visualized. Patient tolerated the procedure well. IMPRESSION: Complete colonic obstruction at the level of the mid to distal sigmoid colon. These results were called by telephone at the conclusion of the exam on 03/12/2020 at 10:53 a.m. to provider Lynden Oxford, PA, who verbally acknowledged  these results. Electronically Signed   By: Duanne Guess D.O.   On: 03/12/2020 11:39    EKG: Atrial fibrillation rapid ventricular response  ASSESSMENT AND PLAN:  Atrial fibrillation Postop abdominal  obstruction Colostomy in place Hyponatremia Chronic diastolic heart failure Hypothyroidism . Plan Agree with telemetry rule out myocardial infarction follow-up PCP Continue supplemental rate control metoprolol Cardizem as needed amiodarone may also be added to help with rhythm control Consider adding digoxin because of relatively low blood pressure Continue therapy for diastolic heart failure We will resume Eliquis when patient stable  Signed: Alwyn Peawayne D Rochelle Larue MD 03/13/2020, 12:44 PM

## 2020-03-13 NOTE — Progress Notes (Signed)
Brief cardiology note Patient seen because of atrial fibrillation Has significant electrolyte abnormalities including hyponatremia A. fib rate around 110 -115 Would recommend rate control with metoprolol or Cardizem Agree with IV amiodarone to help with rhythm management electrolytes are corrected Consider short-term anticoagulation with heparin or Lovenox and consider switching to Eliquis or Xarelto later Recommend conservative cardiac input for now Full cardiology consult note to follow  Thanks [facility]

## 2020-03-13 NOTE — Progress Notes (Signed)
Lauren Bouillon, MD [address], [place], [place], [address] [address], [place], [place], [address] Phone: [phone]  Fax: [phone]   Subjective: Patient tolerating oral diet.  No nausea or vomiting.  Denies abdominal pain.   Objective: Exam: Vital signs in last 24 hours: Vitals:   03/13/20 1300 03/13/20 1330 03/13/20 1430 03/13/20 1530  BP: (!) 126/92 122/86 112/82 114/74  Pulse: (!) 128 (!) 146 (!) 135 (!) 142  Resp: 19 (!) 28 (!) 24 (!) 32  Temp:  98 F (36.7 C) 98 F (36.7 C) 98 F (36.7 C)  TempSrc:  Oral Oral Oral  SpO2: 98% 98% 98% 98%  Weight:      Height:       Weight change: -0.004 kg  Intake/Output Summary (Last 24 hours) at 03/13/2020 1851 Last data filed at 03/13/2020 1659 Gross per 24 hour  Intake 980 ml  Output 1400 ml  Net -420 ml    General: No acute distress, AAO x3 Abd: Soft, NT/ND, No HSM Skin: Warm, no rashes Neck: Supple, Trachea midline   Lab Results: Lab Results  Component Value Date   WBC 13.0 (H) 03/13/2020   HGB 12.3 03/13/2020   HCT 37.1 03/13/2020   MCV 81.0 03/13/2020   PLT 180 03/13/2020   Micro Results: Recent Results (from the past 240 hour(s))  Respiratory Panel by RT PCR (Flu A&B, Covid) - Nasopharyngeal Swab     Status: None   Collection Time: 03/12/20  1:58 AM   Specimen: Nasopharyngeal Swab  Result Value Ref Range Status   SARS Coronavirus 2 by RT PCR NEGATIVE NEGATIVE Final    Comment: (NOTE) SARS-CoV-2 target nucleic acids are NOT DETECTED.  The SARS-CoV-2 RNA is generally detectable in upper respiratoy specimens during the acute phase of infection. The lowest concentration of SARS-CoV-2 viral copies this assay can detect is 131 copies/mL. A negative result does not preclude SARS-Cov-2 infection and should not be used as the sole basis for treatment or other patient management decisions. A negative result may occur with  improper specimen collection/handling, submission  of specimen other than nasopharyngeal swab, presence of viral mutation(s) within the areas targeted by this assay, and inadequate number of viral copies (<131 copies/mL). A negative result must be combined with clinical observations, patient history, and epidemiological information. The expected result is Negative.  Fact Sheet for Patients:  https://www.moore.com/  Fact Sheet for Healthcare Providers:  https://www.young.biz/  This test is no t yet approved or cleared by the [place] FDA and  has been authorized for detection and/or diagnosis of SARS-CoV-2 by FDA under an Emergency Use Authorization (EUA). This EUA will remain  in effect (meaning this test can be used) for the duration of the COVID-19 declaration under Section 564(b)(1) of the Act, 21 U.S.C. section 360bbb-3(b)(1), unless the authorization is terminated or revoked sooner.     Influenza A by PCR NEGATIVE NEGATIVE Final   Influenza B by PCR NEGATIVE NEGATIVE Final    Comment: (NOTE) The Xpert Xpress SARS-CoV-2/FLU/RSV assay is intended as an aid in  the diagnosis of influenza from Nasopharyngeal swab specimens and  should not be used as a sole basis for treatment. Nasal washings and  aspirates are unacceptable for Xpert Xpress SARS-CoV-2/FLU/RSV  testing.  Fact Sheet for Patients: https://www.moore.com/  Fact Sheet for Healthcare Providers: https://www.young.biz/  This test is not yet approved or cleared by the [place] FDA and  has been authorized for detection and/or diagnosis of SARS-CoV-2  by  FDA under an Emergency Use Authorization (EUA). This EUA will remain  in effect (meaning this test can be used) for the duration of the  Covid-19 declaration under Section 564(b)(1) of the Act, 21  U.S.C. section 360bbb-3(b)(1), unless the authorization is  terminated or revoked. Performed at [facility], [address] [place], [place] [address]    Studies/Results: [place] Chest 1 View  Result Date: 03/13/2020 CLINICAL DATA:  Tachycardia EXAM: CHEST  1 VIEW COMPARISON:  02/22/2020 FINDINGS: There is cardiomegaly with small pleural effusions. No overt pulmonary edema. IMPRESSION: Cardiomegaly with small pleural effusions. Electronically Signed   By: Deatra Robinson M.D.   On: 03/13/2020 03:27   CT ABDOMEN PELVIS W CONTRAST  Result Date: 03/11/2020 CLINICAL DATA:  74 year old female with abdominal distension. EXAM: CT ABDOMEN AND PELVIS WITH CONTRAST TECHNIQUE: Multidetector CT imaging of the abdomen and pelvis was performed using the standard protocol following bolus administration of intravenous contrast. CONTRAST:  OMNIPAQUE IOHEXOL 300 MG/ML  SOLN COMPARISON:  CT abdomen pelvis dated 12/21/2019. FINDINGS: Lower chest: Trace left and small right pleural effusions. Patchy right lung base consolidation, likely atelectasis. Pneumonia is not excluded clinical correlation is recommended. There is moderate cardiomegaly with biatrial dilatation. There is retrograde flow of contrast from the right atrium into the IVC suggestive of a degree of right heart dysfunction. There is calcification of the mitral annulus. No intra-abdominal free air. Trace free fluid in the pelvis. Hepatobiliary: There is mild irregularity of the liver contour suggestive of early changes of cirrhosis. Bilobed or 2 adjacent cysts in the left lobe of the liver with combined dimension of 2 cm. No intrahepatic biliary ductal dilatation. Cholecystectomy. No retained calcified stone noted in the central CBD. Pancreas: No acute findings. Spleen: Normal in size without focal abnormality. Adrenals/Urinary Tract: The adrenal glands unremarkable. Mild bilateral parenchyma atrophy. There is no hydronephrosis on either side. There is symmetric enhancement and excretion of contrast by both kidneys. The visualized ureters and urinary bladder appear unremarkable.  Stomach/Bowel: There is sigmoid diverticulosis with muscular hypertrophy. There is moderate diffuse colonic distension with air extending to the level of the sigmoid colon suggestive of a degree of obstruction of the sigmoid colon, likely related to muscular hypertrophy or stricture. Underlying mass is not excluded. Clinical correlation and follow-up recommended. Scattered tiny pockets of air along the wall of the cecum and proximal colon likely mixed with the colonic content and less likely pneumatosis. Correlation with lactic acid recommended to exclude bowel ischemia. There is a small hiatal hernia. No evidence of small-bowel obstruction. Vascular/Lymphatic: Mild aortoiliac atherosclerotic disease. The IVC is unremarkable. No portal venous gas. There is no adenopathy. Reproductive: The uterus is grossly unremarkable. Other: Small fat containing umbilical hernia. Musculoskeletal: No acute or significant osseous findings. IMPRESSION: 1. Moderate diffuse colonic distension likely related to a degree of obstruction of the sigmoid colon, secondary to muscular hypertrophy or stricture. Underlying mass is not excluded. Clinical correlation and follow-up recommended. 2. Sigmoid diverticulosis with muscular hypertrophy. No active inflammatory changes. 3. Moderate cardiomegaly with biatrial dilatation. 4. Trace left and small right pleural effusions. Patchy right lung base consolidation, likely atelectasis. Pneumonia is not excluded. 5. Aortic Atherosclerosis (ICD10-I70.0). Electronically Signed   By: Elgie Collard M.D.   On: 03/11/2020 19:45   DG Chest Port 1 View  Result Date: 03/13/2020 CLINICAL DATA:  Dyspnea EXAM: PORTABLE CHEST 1 VIEW COMPARISON:  03/14/2019 FINDINGS: Cardiac shadow remains enlarged. Aortic calcifications are again seen. Small effusions are  noted bilaterally right slightly greater than left. Mild left basilar atelectasis is seen. Mild central vascular congestion is noted as well. IMPRESSION:  Small effusions right greater than left. Mild left basilar atelectasis is seen. Mild vascular congestion is noted as well. Electronically Signed   By: Alcide Clever M.D.   On: 03/13/2020 18:05   DG BE (COLON)W SINGLE CM (SOL OR THIN BA)  Result Date: 03/12/2020 CLINICAL DATA:  Evaluate for colonic obstruction EXAM: SINGLE CONTRAST ENEMA TECHNIQUE: Initial scout AP supine abdominal image obtained. Contrast was introduced into the colon in a retrograde fashion and spot images were obtained. Dilute Omnipaque 300 contrast media was used. FLUOROSCOPY TIME:  Fluoroscopy Time:  1.2 minutes Radiation Exposure Index (if provided by the fluoroscopic device): 28.4 mGy Number of Acquired Spot Images: 12 COMPARISON:  CT 03/11/2020 FINDINGS: Single contrast enema was performed utilizing dilute Omnipaque 300. Contrast filled the rectum and distal sigmoid colon. There is abrupt luminal obstruction the level of the mid to distal sigmoid colon corresponding to site of high-grade luminal narrowing seen on CT. No contrast was able to traverse this level. No extraluminal contrast was visualized. Patient tolerated the procedure well. IMPRESSION: Complete colonic obstruction at the level of the mid to distal sigmoid colon. These results were called by telephone at the conclusion of the exam on 03/12/2020 at 10:53 a.m. to provider Lynden Oxford, PA, who verbally acknowledged these results. Electronically Signed   By: Duanne Guess D.O.   On: 03/12/2020 11:39   [place] EKG SITE RITE  Result Date: 03/13/2020 If Site Rite image not attached, placement could not be confirmed due to current cardiac rhythm.  Medications:  Scheduled Meds: . Chlorhexidine Gluconate Cloth  6 each Topical Daily  . diltiazem  240 mg Oral Daily  . folic acid  1 mg Oral Daily  . levothyroxine  75 mcg Oral QAC breakfast  . metoprolol succinate  50 mg Oral Daily  . pantoprazole  40 mg Oral Daily  . potassium chloride  10 mEq Oral Daily  . sodium  chloride flush  10-40 mL Intracatheter Q12H   Continuous Infusions: . 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

## 2020-03-13 NOTE — Progress Notes (Signed)
   03/13/20 0735  Assess: MEWS Score  Temp 97.9 F (36.6 C)  BP 117/83  Pulse Rate (!) 145  Resp 20  SpO2 98 %  O2 Device Nasal Cannula  O2 Flow Rate (L/min) 2 L/min  Assess: MEWS Score  MEWS Temp 0  MEWS Systolic 0  MEWS Pulse 3  MEWS RR 0  MEWS LOC 0  MEWS Score 3  MEWS Score Color Yellow  Assess: if the MEWS score is Yellow or Red  Were vital signs taken at a resting state? Yes  Focused Assessment No change from prior assessment  Early Detection of Sepsis Score *See Row Information* High  MEWS guidelines implemented *See Row Information* No, previously yellow, continue vital signs every 4 hours  Treat  MEWS Interventions Other (Comment) (MD notified)  Pain Scale 0-10  Pain Score 0  Notify: Provider  Provider Name/Title Dr. Denton Lank  Date Provider Notified 03/13/20  Time Provider Notified [phone]  Notification Type Page  Notification Reason Other (Comment) (Tachycardic)  Response Other (Comment) (consulting cardiology)  Date of Provider Response 03/13/20  Time of Provider Response [id number]  Document  Patient Outcome Transferred/level of care increased

## 2020-03-14 DIAGNOSIS — K56609 Unspecified intestinal obstruction, unspecified as to partial versus complete obstruction: Secondary | ICD-10-CM | POA: Diagnosis not present

## 2020-03-14 LAB — CBC
HCT: 37.3 % (ref 36.0–46.0)
Hemoglobin: 12.4 g/dL (ref 12.0–15.0)
MCH: 26.7 pg (ref 26.0–34.0)
MCHC: 33.2 g/dL (ref 30.0–36.0)
MCV: 80.2 fL (ref 80.0–100.0)
Platelets: 213 10*3/uL (ref 150–400)
RBC: 4.65 MIL/uL (ref 3.87–5.11)
RDW: 18 % — ABNORMAL HIGH (ref 11.5–15.5)
WBC: 13.9 10*3/uL — ABNORMAL HIGH (ref 4.0–10.5)
nRBC: 0 % (ref 0.0–0.2)

## 2020-03-14 LAB — BASIC METABOLIC PANEL
Anion gap: 8 (ref 5–15)
BUN: 27 mg/dL — ABNORMAL HIGH (ref 8–23)
CO2: 21 mmol/L — ABNORMAL LOW (ref 22–32)
Calcium: 8.7 mg/dL — ABNORMAL LOW (ref 8.9–10.3)
Chloride: 98 mmol/L (ref 98–111)
Creatinine, Ser: 0.85 mg/dL (ref 0.44–1.00)
GFR, Estimated: >60 mL/min (ref >60)
Glucose, Bld: 136 mg/dL — ABNORMAL HIGH (ref 70–99)
Potassium: 4 mmol/L (ref 3.5–5.1)
Sodium: 127 mmol/L — ABNORMAL LOW (ref 135–145)

## 2020-03-14 MED ORDER — APIXABAN 5 MG PO TABS
5.0000 mg | ORAL_TABLET | Freq: Two times a day (BID) | ORAL | Status: DC
  Administered 2020-03-14 – 2020-03-20 (×11): 5 mg via ORAL
  Filled 2020-03-14 (×12): qty 1

## 2020-03-14 MED ORDER — ENSURE ENLIVE PO LIQD
237.0000 mL | Freq: Two times a day (BID) | ORAL | Status: DC
  Administered 2020-03-14 – 2020-03-18 (×8): 237 mL via ORAL

## 2020-03-14 MED ORDER — DIGOXIN 0.25 MG/ML IJ SOLN
0.2500 mg | Freq: Once | INTRAMUSCULAR | Status: AC
  Administered 2020-03-14: 0.25 mg via INTRAVENOUS
  Filled 2020-03-14: qty 2

## 2020-03-14 MED ORDER — ORAL CARE MOUTH RINSE
15.0000 mL | Freq: Two times a day (BID) | OROMUCOSAL | Status: DC
  Administered 2020-03-15 – 2020-03-20 (×10): 15 mL via OROMUCOSAL

## 2020-03-14 MED ORDER — AMIODARONE HCL 200 MG PO TABS
200.0000 mg | ORAL_TABLET | Freq: Two times a day (BID) | ORAL | Status: DC
  Administered 2020-03-14 – 2020-03-16 (×5): 200 mg via ORAL
  Filled 2020-03-14 (×5): qty 1

## 2020-03-14 MED ORDER — DIGOXIN 0.25 MG/ML IJ SOLN
0.1250 mg | Freq: Every day | INTRAMUSCULAR | Status: DC
  Administered 2020-03-14 – 2020-03-16 (×3): 0.125 mg via INTRAVENOUS
  Filled 2020-03-14 (×3): qty 2

## 2020-03-14 NOTE — Progress Notes (Signed)
[facility] SURGICAL PROGRESS NOTE  Hospital Day(s): 3.   Post op day(s): 2 Days Post-Op.   Interval History:  Patient seen and examined Issues with tachycardia through the day yesterday (10/12); transferred to telemetry floor.  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 recorded Therapy on-board but has not worked with patient yet secondary to issues with tachycardia throughout the day yesterday  Vital signs in last 24 hours: [min-max] current  Temp:  [97.5 F (36.4 C)-98.4 F (36.9 C)] 98 F (36.7 C) (10/13 0340) Pulse Rate:  [82-150] 86 (10/13 0340) Resp:  [18-32] 22 (10/12 2044) BP: (98-131)/(69-98) 118/80 (10/13 0340) SpO2:  [96 %-100 %] 98 % (10/13 0340)     Height: 5\' 5"  (165.1 cm) Weight: 63.5 kg BMI (Calculated): 23.3   Intake/Output last 2 shifts:  10/12 0701 - 10/13 0700 In: 720 [P.O.:720] Out: 400 [Urine:300; Stool:100]   Physical Exam:  Constitutional: alert, cooperative and no distress  Respiratory: breathing non-labored at rest, on [place] Cardiovascular: Irregularly irregular, tachycardic  Gastrointestinal: Soft, diffuse soreness, non-distended, no rebound/guarding. 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    Labs:  CBC Latest Ref Rng & Units 03/14/2020 03/13/2020 03/12/2020  WBC 4.0 - 10.5 K/uL 13.9(H) 13.0(H) 15.8(H)  Hemoglobin 12.0 - 15.0 g/dL 05/12/2020 45.8 09.9  Hematocrit 36 - 46 % 37.3 37.1 41.1  Platelets 150 - 400 K/uL 213 180 253   CMP Latest Ref Rng & Units 03/14/2020 03/13/2020 03/12/2020  Glucose 70 - 99  mg/dL 05/12/2020) 87 825(K)  BUN 8 - 23 mg/dL 539(J) 23 67(H)  Creatinine 0.44 - 1.00 mg/dL 41(P 3.79 0.24  Sodium 135 - 145 mmol/L 127(L) 132(L) 128(L)  Potassium 3.5 - 5.1 mmol/L 4.0 3.8 4.2  Chloride 98 - 111 mmol/L 98 103 99  CO2 22 - 32 mmol/L 21(L) 19(L) 19(L)  Calcium 8.9 - 10.3 mg/dL 0.97) 7.8(L) 8.0(L)  Total Protein 6.5 - 8.1 g/dL - - -  Total Bilirubin 0.3 - 1.2 mg/dL - - -  Alkaline Phos 38 - 126 U/L - - -  AST 15 - 41 U/L - - -  ALT 0 - 44 U/L - - -    Imaging studies: No new pertinent imaging studies   Assessment/Plan:  74 y.o. female 2 Days Post-Op s/p subtotal colectomy and end ileostomy for complete colonic obstruction at the level of the sigmoid with marked cecal distension intra-operatively, complicated by pertinent comorbidities including Afib, CHF.   - Will trial on FLD this morning; added supplementation as well             - Wean IVF resuscitation; she will be at increased risk of dehydration once ileostomy function returns              - Monitor abdominal examination             - Monitor on-going ileostomy function closely; once ileostomy function consistent she will most likely need anti-diarrheals             - Pain control prn; antiemetics prn             -  WOC RN for ostomy teaching assistance             - Monitor leukocytosis, renal function, electrolytes             - Appreciate cardiology assistance              - Mobilization as feasible; PT following             - Further management per primary service; we will follow    All of the above findings and recommendations were discussed with the patient, and the medical team, and all of patient's questions were answered to her expressed satisfaction.  -- Lynden Oxford, PA-C [facility] 03/14/2020, 7:43 AM [phone] M-F: 7am - 4pm

## 2020-03-14 NOTE — Progress Notes (Signed)
PROGRESS NOTE    [name]   JJO:[id number]  DOB: 06/08/1945  PCP: Mickel Fuchs, MD    DOA: 03/11/2020 LOS: 3   Brief Narrative   [name] is a 74 y.o. female with medical history significant for chronic dCHF, A-fib on Eliquis, history of CVA, hypothyroidism, iron deficiency anemia, history of diverticulitis who presented to the ED on 03/11/20 with nausea, vomiting and abdominal pain and worsening abdominal distention, and no bowel movement in 5 days.  Onset was in the setting of a spicy meal she'd eaten four days prior and subsequently had few days of nausea/vomiting.    She recently had diverticulitis and underwent a colonoscopy in August due to concerns of possible sigmoid obstruction.  Colonoscopy showed only significant sigmoid edema at that time.  In the ED, afebrile, tachycardic and tachypneic.  Labs significant for leukocytosis of 13.3, sodium 126.  CT abdomen pelvis show moderate diffuse colonic distention with a degree of obstruction at the sigmoid colon possibly secondary to muscular hypertrophy versus stricture.  No active diverticulitis.  General surgery consulted.  On 10/11, patient had Gastrografin enema which showed complete colonic obstruction at the level of the mid to distal sigmoid colon.  Patient was taken to surgery, underwent subtotal colectomy.     Assessment & Plan   Principal Problem:   Large bowel obstruction (HCC) Active Problems:   Atrial fibrillation (HCC)   Hypothyroidism   Abdominal distention   Hyponatremia   Chronic diastolic heart failure (HCC)   Large Bowel Obstruction - present on admission with 5 days constipation, diffuse abdominal pain, worsening distention and obstruction confirmed by Gastrografin enema on 10/11.  Underwent subtotal colectomy on 10/11 with Dr. Toula Moos  10/12: POD 1 - pt doing well, some ileostomy output, started on clears by surgery PLAN: --Full liquid --d/c IVF --PRN antiemetics and pain control    Hypotension - resolved.  Post-operatively and overnight 10/11-12 had low BP's but maintained MAP's.   Suspect due to anesthesia medications and hypovolemia.  Responded to fluid bolus in PACU.  Hyponatremia - secondary to hypovolemia from decrease PO intake.  Improving with IV fluids. Na 126 on admission >> 128.   --Hold further IVF --encourage oral hydration  Atrial fibrillation - A fib with RVR in post-op setting - expect rate to improve with hydration and improved BP, resumption of PO medications.   --Cardiology consulted, Dr. Juliann Pares --Transfer to PCU for drip --Amiodarone gtt for rate control given soft BP's PLAN: --continue PO amiodarone 200 mg BID --IV digoxin 0.125 daily --Continue PO Cardizem and metoprolol --Resume Eliquis today, surgery cleared  Chronic diastolic heart failure - compensated on admission, euvolemic.   --Hold Lasix given hyponatremia.      Hypothyroidism -  --cont home synthroid    Diet:  Diet Orders (From admission, onward)    Start     Ordered   03/14/20 1018  Diet full liquid Room service appropriate? Yes; Fluid consistency: Thin  Diet effective now       Question Answer Comment  Room service appropriate? Yes   Fluid consistency: Thin      03/14/20 1017            Code Status: DNR    Subjective 03/14/20    Pt reported having some abdominal pain and received IV morphine which made her very confused.  Tolerating Full liquid diet.   Disposition Plan & Communication   DVT prophylaxis: [id number] Code Status: DNR  Family Communication:  Status is:  inpatient Dispo:   The patient is from: home Anticipated d/c is to: SNF Anticipated d/c date is: >3 days Patient currently is not medically stable to d/c due to: need to advance diet, ensure bowel function.   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  Antimicrobials:  Anti-infectives (From  admission, onward)   Start     Dose/Rate Route Frequency Ordered Stop   03/12/20 2200  cefoTEtan (CEFOTAN) 2 g in sodium chloride 0.9 % 100 mL IVPB        2 g 200 mL/hr over 30 Minutes Intravenous Every 12 hours 03/12/20 1607 03/12/20 2244   03/12/20 1200  cefoTEtan (CEFOTAN) 2 g in sodium chloride 0.9 % 100 mL IVPB        2 g 200 mL/hr over 30 Minutes Intravenous  Once 03/12/20 1131 03/12/20 1300   03/12/20 1130  sodium chloride 0.9 % with cefoTEtan (CEFOTAN) ADS Med       Note to Pharmacy: Mikey Bussing   : cabinet override      03/12/20 1130 03/12/20 1200   03/12/20 0945  piperacillin-tazobactam (ZOSYN) IVPB 3.375 g  Status:  Discontinued        3.375 g 12.5 mL/hr over 240 Minutes Intravenous Every 8 hours 03/12/20 0940 03/12/20 1607        Objective   Vitals:   03/14/20 1140 03/14/20 1600 03/14/20 1653 03/14/20 1710  BP: 108/70 125/78    Pulse: (!) 104 93 88 95  Resp: (!) 23 (!) 27 (!) 22 20  Temp: 97.8 F (36.6 C) 98.4 F (36.9 C)    TempSrc: Oral Oral    SpO2:  100% 99% 99%  Weight:      Height:        Intake/Output Summary (Last 24 hours) at 03/14/2020 1909 Last data filed at 03/14/2020 1523 Gross per 24 hour  Intake 1095.92 ml  Output 500 ml  Net 595.92 ml   Filed Weights   03/11/20 1325 03/12/20 1109  Weight: 63.5 kg 63.5 kg    Physical Exam:  Constitutional: NAD, AAOx3 HEENT: conjunctivae and lids normal, EOMI CV: RRR 3+ systolic murmur. Distal pulses +2.  No cyanosis.   RESP: CTA B/L, normal respiratory effort  GI: +BS, ostomy bag outputting small amount of dark pasty stool Extremities: No effusions, edema in BLE SKIN: warm, dry and intact Neuro: II - XII grossly intact.  Sensation intact Psych: Normal mood and affect.  Appropriate judgement and reason    Labs   Data Reviewed: I have personally reviewed following labs and imaging studies  CBC: Recent Labs  Lab 03/11/20 1335 03/12/20 0445 03/13/20 0303 03/14/20 0450  WBC 13.3* 15.8* 13.0*  13.9*  HGB 15.3* 13.7 12.3 12.4  HCT 46.3* 41.1 37.1 37.3  MCV 79.3* 80.1 81.0 80.2  PLT 290 253 180 213   Basic Metabolic Panel: Recent Labs  Lab 03/11/20 1335 03/12/20 0445 03/13/20 0303 03/14/20 0450  NA 126* 128* 132* 127*  K 4.6 4.2 3.8 4.0  CL 91* 99 103 98  CO2 22 19* 19* 21*  GLUCOSE 125* 109* 87 136*  BUN 22 26* 23 27*  CREATININE 0.98 0.68 0.92 0.85  CALCIUM 9.2 8.0* 7.8* 8.7*  MG  --   --  2.1  --   PHOS  --   --  4.5  --    GFR: Estimated Creatinine Clearance: 50.7 mL/min (by C-G formula based on SCr of 0.85  mg/dL). Liver Function Tests: Recent Labs  Lab 03/11/20 1335  AST 30  ALT 20  ALKPHOS 83  BILITOT 1.5*  PROT 8.1  ALBUMIN 4.1   Recent Labs  Lab 03/11/20 1335  LIPASE 27   No results for input(s): AMMONIA in the last 168 hours. Coagulation Profile: No results for input(s): INR, PROTIME in the last 168 hours. Cardiac Enzymes: No results for input(s): CKTOTAL, CKMB, CKMBINDEX, TROPONINI in the last 168 hours. BNP (last 3 results) No results for input(s): PROBNP in the last 8760 hours. HbA1C: No results for input(s): HGBA1C in the last 72 hours. CBG: Recent Labs  Lab 03/13/20 0748  GLUCAP 85   Lipid Profile: No results for input(s): CHOL, HDL, LDLCALC, TRIG, CHOLHDL, LDLDIRECT in the last 72 hours. Thyroid Function Tests: Recent Labs    03/13/20 0303  TSH 1.828   Anemia Panel: No results for input(s): VITAMINB12, FOLATE, FERRITIN, TIBC, IRON, RETICCTPCT in the last 72 hours. Sepsis Labs: Recent Labs  Lab 03/11/20 1957 03/12/20 0445  LATICACIDVEN 1.4 1.1    Recent Results (from the past 240 hour(s))  Respiratory Panel by RT PCR (Flu A&B, Covid) - Nasopharyngeal Swab     Status: None   Collection Time: 03/12/20  1:58 AM   Specimen: Nasopharyngeal Swab  Result Value Ref Range Status   SARS Coronavirus 2 by RT PCR NEGATIVE NEGATIVE Final    Comment: (NOTE) SARS-CoV-2 target nucleic acids are NOT DETECTED.  The SARS-CoV-2 RNA  is generally detectable in upper respiratoy specimens during the acute phase of infection. The lowest concentration of SARS-CoV-2 viral copies this assay can detect is 131 copies/mL. A negative result does not preclude SARS-Cov-2 infection and should not be used as the sole basis for treatment or other patient management decisions. A negative result may occur with  improper specimen collection/handling, submission of specimen other than nasopharyngeal swab, presence of viral mutation(s) within the areas targeted by this assay, and inadequate number of viral copies (<131 copies/mL). A negative result must be combined with clinical observations, patient history, and epidemiological information. The expected result is Negative.  Fact Sheet for Patients:  https://www.moore.com/https://www.fda.gov/media/142436/download  Fact Sheet for Healthcare Providers:  https://www.young.biz/https://www.fda.gov/media/142435/download  This test is no t yet approved or cleared by the [place] FDA and  has been authorized for detection and/or diagnosis of SARS-CoV-2 by FDA under an Emergency Use Authorization (EUA). This EUA will remain  in effect (meaning this test can be used) for the duration of the COVID-19 declaration under Section 564(b)(1) of the Act, 21 U.S.C. section 360bbb-3(b)(1), unless the authorization is terminated or revoked sooner.     Influenza A by PCR NEGATIVE NEGATIVE Final   Influenza B by PCR NEGATIVE NEGATIVE Final    Comment: (NOTE) The Xpert Xpress SARS-CoV-2/FLU/RSV assay is intended as an aid in  the diagnosis of influenza from Nasopharyngeal swab specimens and  should not be used as a sole basis for treatment. Nasal washings and  aspirates are unacceptable for Xpert Xpress SARS-CoV-2/FLU/RSV  testing.  Fact Sheet for Patients: https://www.moore.com/https://www.fda.gov/media/142436/download  Fact Sheet for Healthcare Providers: https://www.young.biz/https://www.fda.gov/media/142435/download  This test is not yet approved or cleared by the [place] FDA and  has been authorized for detection and/or diagnosis of SARS-CoV-2 by  FDA under an Emergency Use Authorization (EUA). This EUA will remain  in effect (meaning this test can be used) for the duration of the  Covid-19 declaration under Section 564(b)(1) of the Act, 21  U.S.C. section 360bbb-3(b)(1), unless the authorization  is  terminated or revoked. Performed at [facility], [address], [place], [place] [address]       Imaging Studies   DG Chest 1 View  Result Date: 03/13/2020 CLINICAL DATA:  Tachycardia EXAM: CHEST  1 VIEW COMPARISON:  02/22/2020 FINDINGS: There is cardiomegaly with small pleural effusions. No overt pulmonary edema. IMPRESSION: Cardiomegaly with small pleural effusions. Electronically Signed   By: Deatra Robinson M.D.   On: 03/13/2020 03:27   DG Chest Port 1 View  Result Date: 03/13/2020 CLINICAL DATA:  Dyspnea EXAM: PORTABLE CHEST 1 VIEW COMPARISON:  03/14/2019 FINDINGS: Cardiac shadow remains enlarged. Aortic calcifications are again seen. Small effusions are noted bilaterally right slightly greater than left. Mild left basilar atelectasis is seen. Mild central vascular congestion is noted as well. IMPRESSION: Small effusions right greater than left. Mild left basilar atelectasis is seen. Mild vascular congestion is noted as well. Electronically Signed   By: Alcide Clever M.D.   On: 03/13/2020 18:05   [place] EKG SITE RITE  Result Date: 03/13/2020 If Site Rite image not attached, placement could not be confirmed due to current cardiac rhythm.    Medications   Scheduled Meds: . amiodarone  200 mg Oral BID  . apixaban  5 mg Oral BID  . Chlorhexidine Gluconate Cloth  6 each Topical Daily  . digoxin  0.125 mg Intravenous Daily  . diltiazem  240 mg Oral Daily  . feeding supplement  237 mL Oral BID BM  . folic acid  1 mg Oral Daily  . levothyroxine  75 mcg Oral QAC breakfast  . metoprolol succinate  50 mg Oral Daily  . pantoprazole  40 mg  Oral Daily  . potassium chloride  10 mEq Oral Daily  . sodium chloride flush  10-40 mL Intracatheter Q12H   Continuous Infusions:      LOS: 3 days     Darlin Priestly, md [facility]  03/14/2020, 7:09 PM

## 2020-03-14 NOTE — Progress Notes (Signed)
Ch visited with Pt as per recomendation by chaplain to take Bible and "Daily Bread" to room. Pt reported that she already was given them. Pt says that her 76 year old prayer partner likes to hear her read the bible. She says that her prayer partner has Psalms 71 memorized, and her family is astounded by her memory. She says that they have been claiming scriptures an praying and getting answers for them "No plague will come near thee". Pt requests visit later because she is one the phone. Ch passed on visit to other chaplains to follow-up.

## 2020-03-14 NOTE — Care Management Important Message (Signed)
Important Message  Patient Details  Name: [name] MRN: [id number] Date of Birth: 1945/06/17   Medicare Important Message Given:  Yes     [name] 03/14/2020, 10:10 AM

## 2020-03-14 NOTE — Progress Notes (Addendum)
[facility]    SUBJECTIVE: Patient states she feels much better would like to start getting up and going to the bathroom instead of the bedpan denies any palpitations or tachycardia heart rates much improved taking p.o. now no abdominal pain.   Vitals:   03/13/20 2044 03/13/20 2044 03/14/20 0340 03/14/20 0835  BP:  120/79 118/80 106/79  Pulse:  (!) 116 86 93  Resp: (!) 22   20  Temp:  98.4 F (36.9 C) 98 F (36.7 C) 97.7 F (36.5 C)  TempSrc:  Oral Oral Oral  SpO2:  99% 98% 98%  Weight:      Height:         Intake/Output Summary (Last 24 hours) at 03/14/2020 0900 Last data filed at 03/13/2020 2032 Gross per 24 hour  Intake 720 ml  Output 350 ml  Net 370 ml      PHYSICAL EXAM  General: Well developed, well nourished, in no acute distress HEENT:  Normocephalic and atramatic Neck:  No JVD.  Lungs: Clear bilaterally to auscultation and percussion. Heart: Irregular. Normal S1 and S2 without gallops or murmurs.  Abdomen: Bowel sounds are positive, abdomen soft and non-tender colostomy in place Msk:  Back normal, normal gait. Normal strength and tone for age. Extremities: No clubbing, cyanosis or edema.   Neuro: Alert and oriented X 3. Psych:  Good affect, responds appropriately   LABS: Basic Metabolic Panel: Recent Labs    03/13/20 0303 03/14/20 0450  NA 132* 127*  K 3.8 4.0  CL 103 98  CO2 19* 21*  GLUCOSE 87 136*  BUN 23 27*  CREATININE 0.92 0.85  CALCIUM 7.8* 8.7*  MG 2.1  --   PHOS 4.5  --    Liver Function Tests: Recent Labs    03/11/20 1335  AST 30  ALT 20  ALKPHOS 83  BILITOT 1.5*  PROT 8.1  ALBUMIN 4.1   Recent Labs    03/11/20 1335  LIPASE 27   CBC: Recent Labs    03/13/20 0303 03/14/20 0450  WBC 13.0* 13.9*  HGB 12.3 12.4  HCT 37.1 37.3  MCV 81.0 80.2  PLT 180 213   Cardiac Enzymes: No results for input(s): CKTOTAL, CKMB, CKMBINDEX, TROPONINI in the last 72 hours. BNP: Invalid input(s): POCBNP D-Dimer: No results  for input(s): DDIMER in the last 72 hours. Hemoglobin A1C: No results for input(s): HGBA1C in the last 72 hours. Fasting Lipid Panel: No results for input(s): CHOL, HDL, LDLCALC, TRIG, CHOLHDL, LDLDIRECT in the last 72 hours. Thyroid Function Tests: Recent Labs    03/13/20 0303  TSH 1.828   Anemia Panel: No results for input(s): VITAMINB12, FOLATE, FERRITIN, TIBC, IRON, RETICCTPCT in the last 72 hours.  DG Chest 1 View  Result Date: 03/13/2020 CLINICAL DATA:  Tachycardia EXAM: CHEST  1 VIEW COMPARISON:  02/22/2020 FINDINGS: There is cardiomegaly with small pleural effusions. No overt pulmonary edema. IMPRESSION: Cardiomegaly with small pleural effusions. Electronically Signed   By: Deatra Robinson M.D.   On: 03/13/2020 03:27   DG Chest Port 1 View  Result Date: 03/13/2020 CLINICAL DATA:  Dyspnea EXAM: PORTABLE CHEST 1 VIEW COMPARISON:  03/14/2019 FINDINGS: Cardiac shadow remains enlarged. Aortic calcifications are again seen. Small effusions are noted bilaterally right slightly greater than left. Mild left basilar atelectasis is seen. Mild central vascular congestion is noted as well. IMPRESSION: Small effusions right greater than left. Mild left basilar atelectasis is seen. Mild vascular congestion is noted as well. Electronically Signed   By:  Alcide Clever M.D.   On: 03/13/2020 18:05   DG BE (COLON)W SINGLE CM (SOL OR THIN BA)  Result Date: 03/12/2020 CLINICAL DATA:  Evaluate for colonic obstruction EXAM: SINGLE CONTRAST ENEMA TECHNIQUE: Initial scout AP supine abdominal image obtained. Contrast was introduced into the colon in a retrograde fashion and spot images were obtained. Dilute Omnipaque 300 contrast media was used. FLUOROSCOPY TIME:  Fluoroscopy Time:  1.2 minutes Radiation Exposure Index (if provided by the fluoroscopic device): 28.4 mGy Number of Acquired Spot Images: 12 COMPARISON:  CT 03/11/2020 FINDINGS: Single contrast enema was performed utilizing dilute Omnipaque 300.  Contrast filled the rectum and distal sigmoid colon. There is abrupt luminal obstruction the level of the mid to distal sigmoid colon corresponding to site of high-grade luminal narrowing seen on CT. No contrast was able to traverse this level. No extraluminal contrast was visualized. Patient tolerated the procedure well. IMPRESSION: Complete colonic obstruction at the level of the mid to distal sigmoid colon. These results were called by telephone at the conclusion of the exam on 03/12/2020 at 10:53 a.m. to provider Lynden Oxford, PA, who verbally acknowledged these results. Electronically Signed   By: Duanne Guess D.O.   On: 03/12/2020 11:39   [place] EKG SITE RITE  Result Date: 03/13/2020 If Site Rite image not attached, placement could not be confirmed due to current cardiac rhythm.    Echo 12/2019 preserved left ventricular function EF around 60%  TELEMETRY: Atrial fib flutter rate of about 95  ASSESSMENT AND PLAN:  Principal Problem:   Large bowel obstruction (HCC) Active Problems:   Atrial fibrillation (HCC)   Hypothyroidism   Abdominal distention   Hyponatremia   Chronic diastolic heart failure (HCC)    Plan Continue rate control for atrial fibrillation metoprolol Cardizem of both Continue IV digoxin to help with rate control We will transition from IV amiodarone to p.o. 200 mg twice a day Resume anticoagulation when okay with surgery probably with Eliquis Continue conservative cardiac management Increase activity to ambulation and physical therapy Postop bowel obstruction doing reasonably well followed by surgery History of diastolic heart failure consider diuretics when appropriate Continue conservative management  Alwyn Pea, MD 03/14/2020 9:00 AM

## 2020-03-14 NOTE — Evaluation (Addendum)
Physical Therapy Evaluation Patient Details Name: [name] MRN: [id number] DOB: 1946/04/14 Today's Date: 03/14/2020   History of Present Illness  Pt is a 74yo F admitted to [facility] on 10/10 for abdominal distention, nausea, and vomiting secondary to a large bowel obstruction. Pt with significant PMH including: Afib, CHF, cholecystectomy with recent colonoscopy, hx CVA, hypothyroidism. 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 Claudine Mouton. Pt transported to cardiac telemetry floor secondary to Afib with RVR.    Clinical Impression  Pt is a 74 year old F admitted to hospital on 10/10 for subtotal colectomy and end ileostomy on 10/11 by Dr. Claudine Mouton. At baseline, pt is independent with all ADL's, simple IADL's, household ambulation without AD, and community ambulation with SPC; pt son/DIL assist with groceries once/mo. Pt presents with generalized weakness, decreased activity tolerance, decreased balance, increased pain levels, decreased cardiopulmonary tolerance to mobility, and increased O2 dependence from baseline resulting in impaired functional mobility. Due to deficits, pt required max assist for bed mobility, CGA-min assist for transfers with RW, and CGA for short distance ambulation with RW. Mod cueing provided during session for safety and pain management techniques. Further mobility limited secondary to pain, fatigue, and vitals with pt HR reaching 138bpm max during mobility. Increased time required during treatment for self care ADL's. Deficits limit the pt's ability to safely and independently perform ADL's, transfer, and ambulate. Pt will benefit from acute skilled PT services to address deficits for return to baseline function. Pt will also benefit from OT consult as she required assistance for ADL's during session. At this time, PT recommends SNF at DC to address deficits and improve overall safety with functional mobility prior to return home. Will defer DME needs to  postacute facility, however, pt may benefit from 3in1, shower chair, and RW at home for energy conservation and safety with functional mobility/participation with ADL's.     Follow Up Recommendations SNF    Equipment Recommendations  Other (comment) (Will defer to postacute facility; pt may benefit from 3in1, shower chair, and RW at home.)    Recommendations for Other Services OT consult     Precautions / Restrictions Precautions Precautions: Fall Precaution Comments: Abdominal sx; log roll for bed mobility Restrictions Weight Bearing Restrictions: No      Mobility  Bed Mobility Overal bed mobility: Needs Assistance Bed Mobility: Rolling;Supine to Sit;Sit to Supine Rolling: Supervision   Supine to sit: Max assist;HOB elevated Sit to supine: Max assist;HOB elevated   General bed mobility comments: Supervision for rolling to the Left requiring increased time/effort and use of UE/LE for support and facilitation. Max assist for trunk/BLE facilitation for supine<>sit transfer; multimodal cues for sequencing.  Transfers Overall transfer level: Needs assistance Equipment used: Rolling walker (2 wheeled) Transfers: Sit to/from Stand;Anterior-Posterior Transfer Sit to Stand: Min guard;Mod assist;+2 safety/equipment;From elevated surface     Anterior-Posterior transfers: Max assist   General transfer comment: CGA to stand from elevated bed height with RW and mod to stand from [facility] with RW; verbal cues for hand placement on RW. Max assist for anterior scooting towards EOB via draw sheet.  Ambulation/Gait Ambulation/Gait assistance: +2 safety/equipment;Min guard Gait Distance (Feet): 4 Feet         General Gait Details: CGA for safety to ambulate short distance with RW from EOB<>BSC. Pt demonstrated slowed cadence, decreased step length/foot clearance bil, forward flexed posture, and narrow BOS. Verbal cues for walker proximity.  Stairs  Wheelchair Mobility     Modified Rankin (Stroke Patients Only)       Balance Overall balance assessment: Needs assistance Sitting-balance support: Bilateral upper extremity supported;Feet supported Sitting balance-Leahy Scale: Fair Sitting balance - Comments: Fair seated balance at EOB and on BSC with BUE/BLE support.   Standing balance support: During functional activity;Bilateral upper extremity supported Standing balance-Leahy Scale: Poor Standing balance comment: Poor standing balance with BUE on RW, requiring CGA for safety                             Pertinent Vitals/Pain Pain Assessment: 0-10 Pain Score: 6  (1/10 at rest; 6/10 with mobility) Pain Location: abdominal incision Pain Intervention(s): Limited activity within patient's tolerance;Premedicated before session;Repositioned;Monitored during session    Home Living Family/patient expects to be discharged to:: Private residence Living Arrangements: Alone Available Help at Discharge: Family;Available PRN/intermittently Type of Home: Apartment Home Access: Level entry     Home Layout: One level Home Equipment: Cane - single point      Prior Function Level of Independence: Independent with assistive device(s)         Comments: Uses SPC for community ambulation, no falls reported. Does not drive. Family assists with groceries once a month and clipping pt toenails. Does not require assist for other ADL/IADL. Members of pt's church live in her apartment and come by to check on her daily.     Hand Dominance   Dominant Hand: Right    Extremity/Trunk Assessment   Upper Extremity Assessment Upper Extremity Assessment: Generalized weakness (unable to assess secondary to acuity of sx and pain with mobility; observed to be at least 3+/5)    Lower Extremity Assessment Lower Extremity Assessment: Generalized weakness (unable to assess secondary to acuity of sx and pain with mobility; observed to be at least 3+/5)     Cervical / Trunk Assessment Cervical / Trunk Assessment: Kyphotic  Communication   Communication: No difficulties  Cognition Arousal/Alertness: Awake/alert;Lethargic Behavior During Therapy: WFL for tasks assessed/performed Overall Cognitive Status: No family/caregiver present to determine baseline cognitive functioning                                 General Comments: Pt reports STM deficit. Pt awake and alert, but got more lethargic throughout evaluation secondary to morphine. Pt presents with slowed processing.      General Comments General comments (skin integrity, edema, etc.): Abdominal incision and ileostomy bag    Exercises Other Exercises Other Exercises: Pt educated regarding: log roll technique for bed mobility and pursed lip breathing for pain management, activity modification, safety with mobility in RW, DC recommendation   Assessment/Plan    PT Assessment Patient needs continued PT services  PT Problem List Decreased strength;Decreased mobility;Decreased activity tolerance;Cardiopulmonary status limiting activity;Decreased balance;Pain       PT Treatment Interventions Gait training;Functional mobility training;Therapeutic activities;Therapeutic exercise;Balance training;Neuromuscular re-education;Patient/family education    PT Goals (Current goals can be found in the Care Plan section)  Acute Rehab PT Goals Patient Stated Goal: to go home PT Goal Formulation: With patient Time For Goal Achievement: 03/28/20 Potential to Achieve Goals: Fair    Frequency Min 2X/week   Barriers to discharge Decreased caregiver support      Co-evaluation               AM-PAC PT "6 Clicks" Mobility  Outcome Measure Help needed turning  from your back to your side while in a flat bed without using bedrails?: A Little Help needed moving from lying on your back to sitting on the side of a flat bed without using bedrails?: A Lot Help needed moving to and from  a bed to a chair (including a wheelchair)?: A Little Help needed standing up from a chair using your arms (e.g., wheelchair or bedside chair)?: A Lot Help needed to walk in hospital room?: A Little Help needed climbing 3-5 steps with a railing? : A Lot 6 Click Score: 15    End of Session Equipment Utilized During Treatment: Gait belt;Oxygen Activity Tolerance: Patient limited by fatigue;Patient limited by pain Patient left: in bed;with call bell/phone within reach;with bed alarm set;with nursing/sitter in room Nurse Communication: Mobility status PT Visit Diagnosis: Unsteadiness on feet (R26.81);Muscle weakness (generalized) (M62.81)    Time: 7517-0017 PT Time Calculation (min) (ACUTE ONLY): 37 min   Charges:   PT Evaluation $PT Eval Moderate Complexity: 1 Mod PT Treatments $Therapeutic Activity: 8-22 mins   Vira Blanco, PT, DPT 11:07 AM,03/14/20

## 2020-03-15 DIAGNOSIS — K56609 Unspecified intestinal obstruction, unspecified as to partial versus complete obstruction: Secondary | ICD-10-CM | POA: Diagnosis not present

## 2020-03-15 LAB — CBC
HCT: 30.2 % — ABNORMAL LOW (ref 36.0–46.0)
Hemoglobin: 10.3 g/dL — ABNORMAL LOW (ref 12.0–15.0)
MCH: 27.5 pg (ref 26.0–34.0)
MCHC: 34.1 g/dL (ref 30.0–36.0)
MCV: 80.5 fL (ref 80.0–100.0)
Platelets: 164 10*3/uL (ref 150–400)
RBC: 3.75 MIL/uL — ABNORMAL LOW (ref 3.87–5.11)
RDW: 17.7 % — ABNORMAL HIGH (ref 11.5–15.5)
WBC: 7 10*3/uL (ref 4.0–10.5)
nRBC: 0 % (ref 0.0–0.2)

## 2020-03-15 LAB — BASIC METABOLIC PANEL
Anion gap: 8 (ref 5–15)
BUN: 25 mg/dL — ABNORMAL HIGH (ref 8–23)
CO2: 25 mmol/L (ref 22–32)
Calcium: 8.3 mg/dL — ABNORMAL LOW (ref 8.9–10.3)
Chloride: 95 mmol/L — ABNORMAL LOW (ref 98–111)
Creatinine, Ser: 0.74 mg/dL (ref 0.44–1.00)
GFR, Estimated: >60 mL/min (ref >60)
Glucose, Bld: 89 mg/dL (ref 70–99)
Potassium: 3.9 mmol/L (ref 3.5–5.1)
Sodium: 128 mmol/L — ABNORMAL LOW (ref 135–145)

## 2020-03-15 LAB — MAGNESIUM: Magnesium: 2.1 mg/dL (ref 1.7–2.4)

## 2020-03-15 LAB — SURGICAL PATHOLOGY

## 2020-03-15 NOTE — Plan of Care (Signed)

## 2020-03-15 NOTE — Consult Note (Addendum)
WOC Nurse ostomy follow up Pt lives alone and states there are no friends or family she wants to be present for ostomy teaching. She states she would like to go to a rehab facility after discharge if possible to increase her strength and proficiency before returning home.  Stoma type/location: Stoma is dark red and viable, above skin level,some old bloody clots and drainage surrounding the site, 1 1/4 inches,  Peristomal assessment: intact skin surrounding Output: mod amt liquid brown stool Ostomy pouching: 2pc.  Education provided: Demonstrated pouch change using 2 piece pouch(2 3/4 inch)  and barrier ring to attempt to maintain a seal.  Pt watched the process in a hand held mirror and assisted, and asked appropriate questions.  She was able to open and close the pouch to empty. Reviewed pouching routines and ordering supplies.  Educational materials left at the bedside and 4 sets of extra pouches/wafers/barrier rings.  Pt could benefit from home health assistance after discharge if she does not go to a facility.  Enrolled patient in [place] Secure Start Discharge program: Yes; previously ordered one piece precut pouches for samples at for patient's ease of use at home.  Cammie Mcgee MSN, RN, CWOCN, [place], CNS [phone]

## 2020-03-15 NOTE — Progress Notes (Signed)
[facility] SURGICAL PROGRESS NOTE  Hospital Day(s): 4.   Post op day(s): 3 Days Post-Op.   Interval History:  Patient seen and examined no acute events or new complaints overnight.  Patient reports she feels good and had a good night sleep No nausea, emesis Leukocytosis resolved, now 7.0K, no fevers Hgb trending down, now 10.3, suspect dilutional as large drop across all lines although Eliquis was started on 10/13, monitor closely Renal function remains normal, sCr - 0.74, UO - 600 ccs + unmeasured Stable hyponatremia at 128, o/w no significant electrolyte derangements  She was advanced to FLD; tolerating well Ileostomy with 500 ccs out in last 24 hours Working with therapies; recommending SNF  Vital signs in last 24 hours: [min-max] current  Temp:  [97.7 F (36.5 C)-98.4 F (36.9 C)] 98.1 F (36.7 C) (10/14 0554) Pulse Rate:  [76-104] 86 (10/14 0600) Resp:  [16-27] 18 (10/14 0600) BP: (106-131)/(66-91) 115/66 (10/14 0600) SpO2:  [97 %-100 %] 98 % (10/14 0600) Weight:  [68.3 kg] 68.3 kg (10/14 0554)     Height: 5\' 5"  (165.1 cm) Weight: 68.3 kg BMI (Calculated): 25.06   Intake/Output last 2 shifts:  10/13 0701 - 10/14 0700 In: 1095.9 [P.O.:480; I.V.:615.9] Out: 1100 [Urine:600; Stool:500]   Physical Exam:  Constitutional: alert, cooperative and no distress  Respiratory: breathing non-labored at rest, on [place] Cardiovascular:Irregularly irregular, tachycardic Gastrointestinal:Soft, diffuse soreness, non-distended, no rebound/guarding. 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   Labs:  CBC Latest Ref Rng & Units 03/15/2020 03/14/2020 03/13/2020  WBC 4.0 - 10.5 K/uL 7.0 13.9(H) 13.0(H)  Hemoglobin 12.0 - 15.0 g/dL 10.3(L) 12.4 12.3  Hematocrit 36 - 46 % 30.2(L) 37.3 37.1  Platelets 150 -  400 K/uL 164 213 180   CMP Latest Ref Rng & Units 03/15/2020 03/14/2020 03/13/2020  Glucose 70 - 99 mg/dL 89 05/13/2020) 87  BUN 8 - 23 mg/dL 740(C) 14(G) 23  Creatinine 0.44 - 1.00 mg/dL 81(E 5.63 1.49  Sodium 135 - 145 mmol/L 128(L) 127(L) 132(L)  Potassium 3.5 - 5.1 mmol/L 3.9 4.0 3.8  Chloride 98 - 111 mmol/L 95(L) 98 103  CO2 22 - 32 mmol/L 25 21(L) 19(L)  Calcium 8.9 - 10.3 mg/dL 8.3(L) 8.7(L) 7.8(L)  Total Protein 6.5 - 8.1 g/dL - - -  Total Bilirubin 0.3 - 1.2 mg/dL - - -  Alkaline Phos 38 - 126 U/L - - -  AST 15 - 41 U/L - - -  ALT 0 - 44 U/L - - -     Imaging studies: No new pertinent imaging studies   Assessment/Plan:  74 y.o. female with better return of bowel function 3 Days Post-Op s/p subtotal colectomy and end ileostomyfor complete colonic obstruction at the level of the sigmoid with marked cecal distension intra-operatively, complicated by pertinent comorbidities includingAfib, CHF.   - Will leave on FLD this morning; she would like to try soft diet for dinner; continue supplementation as well - Discontinue IVF resuscitation; she will be at increased risk of dehydration once ileostomy function returns  - Monitor abdominal examination - Monitor on-going ileostomy function closely; once ileostomy function consistent she will most likely need anti-diarrheals - Pain control prn; antiemetics prn - WOC RN for ostomy teaching assistance - Monitor leukocytosis, renal function, electrolytes  - Monitor H&H; suspect dilutional anemia at the moment - Appreciate cardiology assistance  -  Mobilization as feasible; PT following; recommending SNF - Further management per primary service; we will follow  All of the above findings and recommendations were discussed with the patient, and the medical team, and all of patient's questions were answered to her expressed  satisfaction.  -- Lynden Oxford, PA-C [facility] 03/15/2020, 7:49 AM [phone] M-F: 7am - 4pm

## 2020-03-15 NOTE — Progress Notes (Addendum)
Mobility Specialist - Progress Note   03/15/20 1325  Mobility  Activity Sat and stood x 3;Dangled on edge of bed  Level of Assistance Moderate assist, patient does 50-74%  Assistive Device Front wheel walker  Mobility Response Tolerated well  Mobility performed by Mobility specialist  $Mobility charge 1 Mobility    Pt laying in bed upon arrival. Pt agreed to session. Pt transitioned from supine to sitting EOB w/ min. Assist. Pt c/o "4/10" abdominal pain and feeling weak. States she doesn't think she can walk. Pt S2S X 3 w/ mod. Assist. After the 3rd attempt, pt progressed to marching in place 10x and took 3 L lateral steps along bedside. Noticed bed pads and gown were soiled. Pt assisted back into bed. Mobility specialist assisted w/ putting in new clean bed pads and new gown on pt. Overall, pt tolerated session well. States her ab pain was consistent at "4/10" t/o session, but requested pain med. after session. O2 sat > 97% w/ pt on 3.5 L O2 [place] t/o session. Pt left laying in bed w/ alarm set. Call bell and phone placed in reach. Nurse was notified via secure chat.    Ardyn Forge Mobility Specialist  03/15/20, 1:37 PM

## 2020-03-15 NOTE — TOC Initial Note (Signed)
Transition of Care [facility]) - Initial/Assessment Note    Patient Details  Name: [name] MRN: [id number] Date of Birth: 1945/10/03  Transition of Care [facility]) CM/SW Contact:    Maree Krabbe, LCSW Phone Number: 03/15/2020, 12:47 PM  Clinical Narrative:    Pt is alert and oriented. CSW spoke with pt at bedside. Pt states her son [name], also her POA wanted CSW to call him about SNF. Pt is agreeable for CSW to call pt's son. CSW called pt's son at beside. Pt's son wants her to go to [facility] is agreeable. CSW will send referral.  Pt is vaccinated.             Expected Discharge Plan: Skilled Nursing Facility Barriers to Discharge: Continued Medical Work up   Patient Goals and CMS Choice Patient states their goals for this hospitalization and ongoing recovery are:: to get better   Choice offered to / list presented to : Patient  Expected Discharge Plan and Services Expected Discharge Plan: Skilled Nursing Facility In-house Referral: NA   Post Acute Care Choice: Skilled Nursing Facility Living arrangements for the past 2 months: Single Family Home                                      Prior Living Arrangements/Services Living arrangements for the past 2 months: Single Family Home Lives with:: Self Patient language and need for interpreter reviewed:: Yes Do you feel safe going back to the place where you live?: Yes      Need for Family Participation in Patient Care: Yes (Comment) Care giver support system in place?: Yes (comment)   Criminal Activity/Legal Involvement Pertinent to Current Situation/Hospitalization: No - Comment as needed  Activities of Daily Living Home Assistive Devices/Equipment: Cane (specify quad or straight) ADL Screening (condition at time of admission) Patient's cognitive ability adequate to safely complete daily activities?: Yes Is the patient deaf or have difficulty hearing?: No Does the patient have difficulty seeing, even when  wearing glasses/contacts?: No Does the patient have difficulty concentrating, remembering, or making decisions?: No Patient able to express need for assistance with ADLs?: Yes Does the patient have difficulty dressing or bathing?: No Independently performs ADLs?: Yes (appropriate for developmental age) Does the patient have difficulty walking or climbing stairs?: Yes Weakness of Legs: None Weakness of Arms/Hands: None  Permission Sought/Granted Permission sought to share information with : Family Supports    Share Information with NAME: [name]  Permission granted to share info w AGENCY: Licensed conveyancer granted to share info w Relationship: POA, son     Emotional Assessment Appearance:: Appears stated age Attitude/Demeanor/Rapport: Engaged Affect (typically observed): Adaptable, Appropriate Orientation: : Oriented to Situation, Oriented to  Time, Oriented to Place, Oriented to Self Alcohol / Substance Use: Not Applicable Psych Involvement: No (comment)  Admission diagnosis:  Abdominal distention [R14.0] Colon obstruction (HCC) [K56.609] Large bowel obstruction (HCC) [K56.609] Abdominal pain [R10.9] Patient Active Problem List   Diagnosis Date Noted  . Hyponatremia 03/12/2020  . Chronic diastolic heart failure (HCC) 03/12/2020  . Large bowel obstruction (HCC) 03/12/2020  . Abdominal distention 03/11/2020  . Iron deficiency anemia 12/20/2019  . Acute on chronic diastolic CHF (congestive heart failure) (HCC) 12/04/2019  . Atrial fibrillation (HCC)   . Hypertension   . Stroke (HCC)   . Hypothyroidism   . Acute respiratory failure with hypoxia (HCC)   . Acute congestive  heart failure (HCC) 03/17/2019  . Acute gastroenteritis 07/05/2017  . Acute respiratory failure (HCC) 03/27/2011   PCP:  Mickel Fuchs, MD Pharmacy:   [facility] - [place], [place] - [address] [place] [address] Phone: [phone] Fax:  [phone]     Social Determinants of Health (SDOH) Interventions    Readmission Risk Interventions No flowsheet data found.

## 2020-03-15 NOTE — Progress Notes (Signed)
[facility]    SUBJECTIVE: States he feels pretty well denies any pain any nausea vomiting any.  Patient feels reasonably well no abdominal pain she may need to go to rehab.   Vitals:   03/15/20 0554 03/15/20 0600 03/15/20 0844 03/15/20 0846  BP: 120/68 115/66 123/67 107/68  Pulse: 76 86 (!) 49 79  Resp: 16 18 17 18   Temp: 98.1 F (36.7 C)  97.8 F (36.6 C) 98 F (36.7 C)  TempSrc: Oral   Oral  SpO2: 100% 98% 93% 99%  Weight: 68.3 kg     Height:         Intake/Output Summary (Last 24 hours) at 03/15/2020 1047 Last data filed at 03/15/2020 0900 Gross per 24 hour  Intake 855.92 ml  Output 1050 ml  Net -194.08 ml      PHYSICAL EXAM  General: Well developed, well nourished, in no acute distress HEENT:  Normocephalic and atramatic Neck:  No JVD.  Lungs: Clear bilaterally to auscultation and percussion. Heart: HRRR . Normal S1 and S2 without gallops or murmurs.  Abdomen: Bowel sounds are positive, abdomen soft and non-tender  Msk:  Back normal, normal gait. Normal strength and tone for age. Extremities: No clubbing, cyanosis or edema.   Neuro: Alert and oriented X 3. Psych:  Good affect, responds appropriately   LABS: Basic Metabolic Panel: Recent Labs    03/13/20 0303 03/13/20 0303 03/14/20 0450 03/15/20 0600  NA 132*   < > 127* 128*  K 3.8   < > 4.0 3.9  CL 103   < > 98 95*  CO2 19*   < > 21* 25  GLUCOSE 87   < > 136* 89  BUN 23   < > 27* 25*  CREATININE 0.92   < > 0.85 0.74  CALCIUM 7.8*   < > 8.7* 8.3*  MG 2.1  --   --  2.1  PHOS 4.5  --   --   --    < > = values in this interval not displayed.   Liver Function Tests: No results for input(s): AST, ALT, ALKPHOS, BILITOT, PROT, ALBUMIN in the last 72 hours. No results for input(s): LIPASE, AMYLASE in the last 72 hours. CBC: Recent Labs    03/14/20 0450 03/15/20 0600  WBC 13.9* 7.0  HGB 12.4 10.3*  HCT 37.3 30.2*  MCV 80.2 80.5  PLT 213 164   Cardiac Enzymes: No results for input(s):  CKTOTAL, CKMB, CKMBINDEX, TROPONINI in the last 72 hours. BNP: Invalid input(s): POCBNP D-Dimer: No results for input(s): DDIMER in the last 72 hours. Hemoglobin A1C: No results for input(s): HGBA1C in the last 72 hours. Fasting Lipid Panel: No results for input(s): CHOL, HDL, LDLCALC, TRIG, CHOLHDL, LDLDIRECT in the last 72 hours. Thyroid Function Tests: Recent Labs    03/13/20 0303  TSH 1.828   Anemia Panel: No results for input(s): VITAMINB12, FOLATE, FERRITIN, TIBC, IRON, RETICCTPCT in the last 72 hours.  DG Chest Port 1 View  Result Date: 03/13/2020 CLINICAL DATA:  Dyspnea EXAM: PORTABLE CHEST 1 VIEW COMPARISON:  03/14/2019 FINDINGS: Cardiac shadow remains enlarged. Aortic calcifications are again seen. Small effusions are noted bilaterally right slightly greater than left. Mild left basilar atelectasis is seen. Mild central vascular congestion is noted as well. IMPRESSION: Small effusions right greater than left. Mild left basilar atelectasis is seen. Mild vascular congestion is noted as well. Electronically Signed   By: 05/14/2019 M.D.   On: 03/13/2020 18:05   05/13/2020  EKG SITE RITE  Result Date: 03/13/2020 If Site Rite image not attached, placement could not be confirmed due to current cardiac rhythm.    Echo preserved overall left ventricular function ejection fraction of around 55%  TELEMETRY: Atrial fibrillation rate of 85 nonspecific ST-T wave changes  ASSESSMENT AND PLAN:  Principal Problem:   Large bowel obstruction (HCC) Active Problems:   Atrial fibrillation (HCC)   Hypothyroidism   Abdominal distention   Hyponatremia   Chronic diastolic heart failure (HCC)    Plan Patient states to be doing reasonably well Heart rate is reasonably controlled A. fib rate of around 85 Continue postop recovery from bowel obstruction Continue colostomy care We will switch amiodarone to 200 mg once a day Continue Eliquis 5 mg twice a day for anticoagulation We will switch  digoxin to p.o. 0.125 daily we will hopefully be able to discontinue soon Maintain diltiazem for blood pressure and heart rate control Patient also on metoprolol Have the patient follow-up with cardiology 1 to 2 weeks    Alwyn Pea, MD 03/15/2020 10:47 AM

## 2020-03-15 NOTE — TOC Progression Note (Signed)
Transition of Care [facility]) - Progression Note    Patient Details  Name: [name] MRN: [id number] Date of Birth: [birth year]  [facility] CM/SW Contact  Maree Krabbe, LCSW Phone Number: 03/15/2020, 1:01 PM  Clinical Narrative:   [facility] will take pt. CSW will initate auth closer to d/c.    Expected Discharge Plan: Skilled Nursing Facility Barriers to Discharge: Continued Medical Work up  Expected Discharge Plan and Services Expected Discharge Plan: Skilled Nursing Facility In-house Referral: NA   Post Acute Care Choice: Skilled Nursing Facility Living arrangements for the past 2 months: Single Family Home                                       Social Determinants of Health (SDOH) Interventions    Readmission Risk Interventions No flowsheet data found.

## 2020-03-15 NOTE — Progress Notes (Addendum)
PROGRESS NOTE    [name]   BWG:[id number]  DOB: 01-08-1946  PCP: Elba Barman, MD    DOA: 03/11/2020 LOS: 4   Brief Narrative   [name] is a 74 y.o. female with medical history significant for chronic dCHF, A-fib on Eliquis, history of CVA, hypothyroidism, iron deficiency anemia, history of diverticulitis who presented to the ED on 03/11/20 with nausea, vomiting and abdominal pain and worsening abdominal distention, and no bowel movement in 5 days.  Onset was in the setting of a spicy meal she'd eaten four days prior and subsequently had few days of nausea/vomiting.    She recently had diverticulitis and underwent a colonoscopy in August due to concerns of possible sigmoid obstruction.  Colonoscopy showed only significant sigmoid edema at that time.  In the ED, afebrile, tachycardic and tachypneic.  Labs significant for leukocytosis of 13.3, sodium 126.  CT abdomen pelvis show moderate diffuse colonic distention with a degree of obstruction at the sigmoid colon possibly secondary to muscular hypertrophy versus stricture.  No active diverticulitis.  General surgery consulted.  On 10/11, patient had Gastrografin enema which showed complete colonic obstruction at the level of the mid to distal sigmoid colon.  Patient was taken to surgery, underwent subtotal colectomy.     Assessment & Plan   Principal Problem:   Large bowel obstruction (HCC) Active Problems:   Atrial fibrillation (HCC)   Hypothyroidism   Abdominal distention   Hyponatremia   Chronic diastolic heart failure (HCC)   Large Bowel Obstruction - present on admission with 5 days constipation, diffuse abdominal pain, worsening distention and obstruction confirmed by Gastrografin enema on 10/11.  Underwent subtotal colectomy on 10/11 with Dr. Luther Bradley  --started having large liquid output via ostomy PLAN: --advance diet to regular, ok'ed with GenSurg --Hold MIVF and encourage oral hydration --PRN  antiemetics and pain control   Hypotension - resolved.  Post-operatively and overnight 10/11-12 had low BP's but maintained MAP's.   Suspect due to anesthesia medications and hypovolemia.  Responded to fluid bolus in PACU.  Hyponatremia - secondary to hypovolemia from decrease PO intake.  Improving with IV fluids. Na 126 on admission >> 128.   --Hold further IVF --encourage oral hydration  A fib with RVR in post-op setting - expect rate to improve with hydration and improved BP, resumption of PO medications.   --Cardiology consulted, Dr. Clayborn Bigness --Transfer to PCU for Amiodarone gtt for rate control given soft BP's PLAN: --reduce amiodraone to 200 mg daily, per cards --switch digoxin to oral 0.125 mg daily --Continue PO Cardizem and metoprolol --continue Eliquis --outpatient f/u with cardiology 1-2 weeks after discharge  Chronic diastolic heart failure - compensated on admission, euvolemic.   --Hold Lasix   Hypothyroidism -  --cont home Synthyroid    Diet:  Diet Orders (From admission, onward)    Start     Ordered   03/15/20 1437  Diet regular Room service appropriate? Yes; Fluid consistency: Thin  Diet effective now       Question Answer Comment  Room service appropriate? Yes   Fluid consistency: Thin      03/15/20 1436            Code Status: DNR    Subjective 03/15/20    Pt reported frequent "spit ups".  No nausea.  Requested Mac N Cheese, sweet potatoe and mashed potatoe for dinner.  Pure dark liquid output in ostomy bag.   Disposition Plan & Communication   DVT prophylaxis: [id number]  Code Status: DNR  Family Communication:  Status is: inpatient Dispo:   The patient is from: home Anticipated d/c is to: SNF Anticipated d/c date is: >3 days Patient currently is not medically stable to d/c due to: need to advance diet, ensure bowel function.   Consults, Procedures, Significant Events   Consultants:   General surgery  Procedures:   Subtotal  colectomy 03/12/20  Antimicrobials:  Anti-infectives (From admission, onward)   Start     Dose/Rate Route Frequency Ordered Stop   03/12/20 2200  cefoTEtan (CEFOTAN) 2 g in sodium chloride 0.9 % 100 mL IVPB        2 g 200 mL/hr over 30 Minutes Intravenous Every 12 hours 03/12/20 1607 03/12/20 2244   03/12/20 1200  cefoTEtan (CEFOTAN) 2 g in sodium chloride 0.9 % 100 mL IVPB        2 g 200 mL/hr over 30 Minutes Intravenous  Once 03/12/20 1131 03/12/20 1300   03/12/20 1130  sodium chloride 0.9 % with cefoTEtan (CEFOTAN) ADS Med       Note to Pharmacy: Sylvester Harder   : cabinet override      03/12/20 1130 03/12/20 1200   03/12/20 0945  piperacillin-tazobactam (ZOSYN) IVPB 3.375 g  Status:  Discontinued        3.375 g 12.5 mL/hr over 240 Minutes Intravenous Every 8 hours 03/12/20 0940 03/12/20 1607        Objective   Vitals:   03/15/20 0600 03/15/20 0844 03/15/20 0846 03/15/20 1140  BP: 115/66 123/67 107/68 114/61  Pulse: 86 (!) 49 79 87  Resp: _0 Temp:  97.8 F (36.6 C) 98 F (36.7 C) 97.8 F (36.6 C)  TempSrc:   Oral Oral  SpO2: 98% 93% 99% (!) 89%  Weight:      Height:        Intake/Output Summary (Last 24 hours) at 03/15/2020 1653 Last data filed at 03/15/2020 0930 Gross per 24 hour  Intake --  Output 850 ml  Net -850 ml   Filed Weights   03/11/20 1325 03/12/20 1109 03/15/20 0554  Weight: 63.5 kg 63.5 kg 68.3 kg    Physical Exam:  Constitutional: NAD, AAOx3 HEENT: conjunctivae and lids normal, EOMI CV: RRR 3+ systolic murmur. Distal pulses +2.  No cyanosis.   RESP: CTA B/L, normal respiratory effort  GI: +BS, ostomy bag with dark liquid Extremities: No effusions, edema in BLE SKIN: warm, dry and intact Neuro: II - XII grossly intact.  Sensation intact Psych: Normal mood and affect.  Appropriate judgement and reason    Labs   Data Reviewed: I have personally reviewed following labs and imaging studies  CBC: Recent Labs  Lab 03/11/20 1335  03/12/20 0445 03/13/20 0303 03/14/20 0450 03/15/20 0600  WBC 13.3* 15.8* 13.0* 13.9* 7.0  HGB 15.3* 13.7 12.3 12.4 10.3*  HCT 46.3* 41.1 37.1 37.3 30.2*  MCV 79.3* 80.1 81.0 80.2 80.5  PLT 290 253 180 213 686   Basic Metabolic Panel: Recent Labs  Lab 03/11/20 1335 03/12/20 0445 03/13/20 0303 03/14/20 0450 03/15/20 0600  NA 126* 128* 132* 127* 128*  K 4.6 4.2 3.8 4.0 3.9  CL 91* 99 103 98 95*  CO2 22 19* 19* 21* 25  GLUCOSE 125* 109* 87 136* 89  BUN 22 26* 23 27* 25*  CREATININE 0.98 0.68 0.92 0.85 0.74  CALCIUM 9.2 8.0* 7.8* 8.7* 8.3*  MG  --   --  2.1  --  2.1  PHOS  --   --  4.5  --   --    GFR: Estimated Creatinine Clearance: 53.8 mL/min (by C-G formula based on SCr of 0.74 mg/dL). Liver Function Tests: Recent Labs  Lab 03/11/20 1335  AST 30  ALT 20  ALKPHOS 83  BILITOT 1.5*  PROT 8.1  ALBUMIN 4.1   Recent Labs  Lab 03/11/20 1335  LIPASE 27   No results for input(s): AMMONIA in the last 168 hours. Coagulation Profile: No results for input(s): INR, PROTIME in the last 168 hours. Cardiac Enzymes: No results for input(s): CKTOTAL, CKMB, CKMBINDEX, TROPONINI in the last 168 hours. BNP (last 3 results) No results for input(s): PROBNP in the last 8760 hours. HbA1C: No results for input(s): HGBA1C in the last 72 hours. CBG: Recent Labs  Lab 03/13/20 0748  GLUCAP 85   Lipid Profile: No results for input(s): CHOL, HDL, LDLCALC, TRIG, CHOLHDL, LDLDIRECT in the last 72 hours. Thyroid Function Tests: Recent Labs    03/13/20 0303  TSH 1.828   Anemia Panel: No results for input(s): VITAMINB12, FOLATE, FERRITIN, TIBC, IRON, RETICCTPCT in the last 72 hours. Sepsis Labs: Recent Labs  Lab 03/11/20 1957 03/12/20 0445  LATICACIDVEN 1.4 1.1    Recent Results (from the past 240 hour(s))  Respiratory Panel by RT PCR (Flu A&B, Covid) - Nasopharyngeal Swab     Status: None   Collection Time: 03/12/20  1:58 AM   Specimen: Nasopharyngeal Swab  Result Value  Ref Range Status   SARS Coronavirus 2 by RT PCR NEGATIVE NEGATIVE Final    Comment: (NOTE) SARS-CoV-2 target nucleic acids are NOT DETECTED.  The SARS-CoV-2 RNA is generally detectable in upper respiratoy specimens during the acute phase of infection. The lowest concentration of SARS-CoV-2 viral copies this assay can detect is 131 copies/mL. A negative result does not preclude SARS-Cov-2 infection and should not be used as the sole basis for treatment or other patient management decisions. A negative result may occur with  improper specimen collection/handling, submission of specimen other than nasopharyngeal swab, presence of viral mutation(s) within the areas targeted by this assay, and inadequate number of viral copies (<131 copies/mL). A negative result must be combined with clinical observations, patient history, and epidemiological information. The expected result is Negative.  Fact Sheet for Patients:  PinkCheek.be  Fact Sheet for Healthcare Providers:  GravelBags.it  This test is no t yet approved or cleared by the [place] FDA and  has been authorized for detection and/or diagnosis of SARS-CoV-2 by FDA under an Emergency Use Authorization (EUA). This EUA will remain  in effect (meaning this test can be used) for the duration of the COVID-19 declaration under Section 564(b)(1) of the Act, 21 U.S.C. section 360bbb-3(b)(1), unless the authorization is terminated or revoked sooner.     Influenza A by PCR NEGATIVE NEGATIVE Final   Influenza B by PCR NEGATIVE NEGATIVE Final    Comment: (NOTE) The Xpert Xpress SARS-CoV-2/FLU/RSV assay is intended as an aid in  the diagnosis of influenza from Nasopharyngeal swab specimens and  should not be used as a sole basis for treatment. Nasal washings and  aspirates are unacceptable for Xpert Xpress SARS-CoV-2/FLU/RSV  testing.  Fact Sheet for  Patients: PinkCheek.be  Fact Sheet for Healthcare Providers: GravelBags.it  This test is not yet approved or cleared by the [place] FDA and  has been authorized for detection and/or diagnosis of SARS-CoV-2 by  FDA under an Emergency Use Authorization (EUA). This EUA will remain  in effect (meaning this test can be used) for the duration of the  Covid-19 declaration under Section 564(b)(1) of the Act, 21  U.S.C. section 360bbb-3(b)(1), unless the authorization is  terminated or revoked. Performed at [facility], [address] [place], [place] [address]       Imaging Studies   DG Chest [place] 1 View  Result Date: 03/13/2020 CLINICAL DATA:  Dyspnea EXAM: PORTABLE CHEST 1 VIEW COMPARISON:  03/14/2019 FINDINGS: Cardiac shadow remains enlarged. Aortic calcifications are again seen. Small effusions are noted bilaterally right slightly greater than left. Mild left basilar atelectasis is seen. Mild central vascular congestion is noted as well. IMPRESSION: Small effusions right greater than left. Mild left basilar atelectasis is seen. Mild vascular congestion is noted as well. Electronically Signed   By: Inez Catalina M.D.   On: 03/13/2020 18:05     Medications   Scheduled Meds: . amiodarone  200 mg Oral BID  . apixaban  5 mg Oral BID  . Chlorhexidine Gluconate Cloth  6 each Topical Daily  . digoxin  0.125 mg Intravenous Daily  . diltiazem  240 mg Oral Daily  . feeding supplement  237 mL Oral BID BM  . folic acid  1 mg Oral Daily  . levothyroxine  75 mcg Oral QAC breakfast  . mouth rinse  15 mL Mouth Rinse BID  . metoprolol succinate  50 mg Oral Daily  . pantoprazole  40 mg Oral Daily  . potassium chloride  10 mEq Oral Daily  . sodium chloride flush  10-40 mL Intracatheter Q12H   Continuous Infusions:      LOS: 4 days     Enzo Bi, md [facility]  03/15/2020, 4:53 PM

## 2020-03-15 NOTE — NC FL2 (Signed)
[place] MEDICAID FL2 LEVEL OF CARE SCREENING TOOL     IDENTIFICATION  Patient Name: [name] Birthdate: 12/01/1945 Sex: female Admission Date (Current Location): 03/11/2020  [place] and [place] Number:  Chiropodist and Address:  [facility], [address] [place], [place] [address]      Provider Number: [id number]  Attending Physician Name and Address:  Darlin Priestly, MD  Relative Name and Phone Number:       Current Level of Care: Hospital Recommended Level of Care: Skilled Nursing Facility Prior Approval Number:    Date Approved/Denied:   PASRR Number: [id number]  Discharge Plan: SNF    Current Diagnoses: Patient Active Problem List   Diagnosis Date Noted  . Hyponatremia 03/12/2020  . Chronic diastolic heart failure (HCC) 03/12/2020  . Large bowel obstruction (HCC) 03/12/2020  . Abdominal distention 03/11/2020  . Iron deficiency anemia 12/20/2019  . Acute on chronic diastolic CHF (congestive heart failure) (HCC) 12/04/2019  . Atrial fibrillation (HCC)   . Hypertension   . Stroke (HCC)   . Hypothyroidism   . Acute respiratory failure with hypoxia (HCC)   . Acute congestive heart failure (HCC) 03/17/2019  . Acute gastroenteritis 07/05/2017  . Acute respiratory failure (HCC) 03/27/2011    Orientation RESPIRATION BLADDER Height & Weight     Self, Time, Situation, Place  Normal Continent Weight: 150 lb 9.2 oz (68.3 kg) Height:  5\' 5"  (165.1 cm)  BEHAVIORAL SYMPTOMS/MOOD NEUROLOGICAL BOWEL NUTRITION STATUS      Continent Diet (soft diet, thin liquids)  AMBULATORY STATUS COMMUNICATION OF NEEDS Skin   Extensive Assist Verbally Surgical wounds (closed incision on abdomen, honeycomb dressing)                       Personal Care Assistance Level of Assistance  Dressing, Bathing, Feeding Bathing Assistance: Maximum assistance Feeding assistance: Independent Dressing Assistance: Maximum assistance     Functional  Limitations Info  Sight, Hearing, Speech Sight Info: Adequate Hearing Info: Adequate Speech Info: Adequate    SPECIAL CARE FACTORS FREQUENCY  PT (By licensed PT), OT (By licensed OT)     PT Frequency: 5x OT Frequency: 5x            Contractures Contractures Info: Not present    Additional Factors Info  Code Status, Allergies Code Status Info: DNR Allergies Info: Prednisone, Predicort (Prednisolone Acetate)           Current Medications (03/15/2020):  This is the current hospital active medication list Current Facility-Administered Medications  Medication Dose Route Frequency Provider Last Rate Last Admin  . amiodarone (PACERONE) tablet 200 mg  200 mg Oral BID Callwood, Dwayne D, MD   200 mg at 03/15/20 0929  . apixaban (ELIQUIS) tablet 5 mg  5 mg Oral BID 03/17/20, MD   5 mg at 03/15/20 0930  . Chlorhexidine Gluconate Cloth 2 % PADS 6 each  6 each Topical Daily 03/17/20 A, DO   6 each at 03/15/20 [id number]  . digoxin (LANOXIN) 0.25 MG/ML injection 0.125 mg  0.125 mg Intravenous Daily Callwood, Dwayne D, MD   0.125 mg at 03/15/20 0930  . diltiazem (CARDIZEM CD) 24 hr capsule 240 mg  240 mg Oral Daily 03/17/20 A, DO   240 mg at 03/15/20 0929  . feeding supplement (ENSURE ENLIVE) (ENSURE ENLIVE) liquid 237 mL  237 mL Oral BID BM 03/17/20, PA-C   237 mL at 03/15/20 0939  .  folic acid (FOLVITE) tablet 1 mg  1 mg Oral Daily Esaw Grandchild A, DO   1 mg at 03/15/20 0929  . levothyroxine (SYNTHROID) tablet 75 mcg  75 mcg Oral QAC breakfast Campbell Lerner, MD   75 mcg at 03/15/20 0550  . MEDLINE mouth rinse  15 mL Mouth Rinse BID Darlin Priestly, MD   15 mL at 03/15/20 0938  . metoprolol succinate (TOPROL-XL) 24 hr tablet 50 mg  50 mg Oral Daily Campbell Lerner, MD   50 mg at 03/15/20 0929  . morphine 2 MG/ML injection 1 mg  1 mg Intravenous Q3H PRN Campbell Lerner, MD   1 mg at 03/14/20 0844  . ondansetron (ZOFRAN-ODT) disintegrating tablet 4 mg  4 mg Oral Q6H PRN  Campbell Lerner, MD       Or  . ondansetron [facility]) injection 4 mg  4 mg Intravenous Q6H PRN Campbell Lerner, MD      . pantoprazole (PROTONIX) EC tablet 40 mg  40 mg Oral Daily Campbell Lerner, MD   40 mg at 03/15/20 0930  . potassium chloride SA (KLOR-CON) CR tablet 10 mEq  10 mEq Oral Daily Campbell Lerner, MD   10 mEq at 03/15/20 0929  . sodium chloride flush (NS) 0.9 % injection 10-40 mL  10-40 mL Intracatheter Q12H Esaw Grandchild A, DO   10 mL at 03/15/20 0939  . sodium chloride flush (NS) 0.9 % injection 10-40 mL  10-40 mL Intracatheter PRN Esaw Grandchild A, DO      . traMADol Janean Sark) tablet 50 mg  50 mg Oral Q6H PRN Ronnald Ramp, RPH   50 mg at 03/13/20 1847     Discharge Medications: Please see discharge summary for a list of discharge medications.  Relevant Imaging Results:  Relevant Lab Results:   Additional Information SSN:[id number]  Maree Krabbe, LCSW

## 2020-03-15 NOTE — Progress Notes (Signed)
   03/13/20 1130  Assess: MEWS Score  Temp 98.2 F (36.8 C)  BP 98/87  Pulse Rate (!) 146  ECG Heart Rate (!) 128  Resp (!) 22  SpO2 96 %  O2 Device Nasal Cannula  O2 Flow Rate (L/min) 2 L/min  Assess: MEWS Score  MEWS Temp 0  MEWS Systolic 1  MEWS Pulse 2  MEWS RR 1  MEWS LOC 0  MEWS Score 4  MEWS Score Color Red  Assess: if the MEWS score is Yellow or Red  Were vital signs taken at a resting state? Yes  Focused Assessment No change from prior assessment  Early Detection of Sepsis Score *See Row Information* Medium  MEWS guidelines implemented *See Row Information* Yes  Treat  MEWS Interventions Administered scheduled meds/treatments  Take Vital Signs  Increase Vital Sign Frequency  Red: Q 1hr X 4 then Q 4hr X 4, if remains red, continue Q 4hrs  Escalate  MEWS: Escalate Red: discuss with charge nurse/RN and provider, consider discussing with RRT  Notify: Charge Nurse/RN  Name of Charge Nurse/RN Notified Larose Hires, RN  Date Charge Nurse/RN Notified 03/13/20  Time Charge Nurse/RN Notified 1201  Notify: Provider  Provider Name/Title Dr Denton Lank and Dr Juliann Pares (per Darien Ramus)  Date Provider Notified 03/13/20  Time Provider Notified 1130  Notification Type Call  Notification Reason Change in status  Response See new orders  Date of Provider Response 03/13/20  Time of Provider Response 1130  Document  Patient Outcome Other (Comment) (HR improved-still high but not as high-per Darien Ramus)  pasted per Darien Ramus RN

## 2020-03-16 DIAGNOSIS — K56609 Unspecified intestinal obstruction, unspecified as to partial versus complete obstruction: Secondary | ICD-10-CM | POA: Diagnosis not present

## 2020-03-16 LAB — CBC
HCT: 30.3 % — ABNORMAL LOW (ref 36.0–46.0)
Hemoglobin: 10.2 g/dL — ABNORMAL LOW (ref 12.0–15.0)
MCH: 27.3 pg (ref 26.0–34.0)
MCHC: 33.7 g/dL (ref 30.0–36.0)
MCV: 81 fL (ref 80.0–100.0)
Platelets: 184 10*3/uL (ref 150–400)
RBC: 3.74 MIL/uL — ABNORMAL LOW (ref 3.87–5.11)
RDW: 17.2 % — ABNORMAL HIGH (ref 11.5–15.5)
WBC: 8.6 10*3/uL (ref 4.0–10.5)
nRBC: 0 % (ref 0.0–0.2)

## 2020-03-16 LAB — BASIC METABOLIC PANEL
Anion gap: 7 (ref 5–15)
BUN: 15 mg/dL (ref 8–23)
CO2: 25 mmol/L (ref 22–32)
Calcium: 8.2 mg/dL — ABNORMAL LOW (ref 8.9–10.3)
Chloride: 95 mmol/L — ABNORMAL LOW (ref 98–111)
Creatinine, Ser: 0.68 mg/dL (ref 0.44–1.00)
GFR, Estimated: >60 mL/min (ref >60)
Glucose, Bld: 94 mg/dL (ref 70–99)
Potassium: 3.8 mmol/L (ref 3.5–5.1)
Sodium: 127 mmol/L — ABNORMAL LOW (ref 135–145)

## 2020-03-16 LAB — MAGNESIUM: Magnesium: 1.9 mg/dL (ref 1.7–2.4)

## 2020-03-16 MED ORDER — AMIODARONE HCL 200 MG PO TABS
200.0000 mg | ORAL_TABLET | Freq: Every day | ORAL | Status: DC
  Administered 2020-03-17 – 2020-03-20 (×3): 200 mg via ORAL
  Filled 2020-03-16 (×4): qty 1

## 2020-03-16 NOTE — Progress Notes (Signed)
PROGRESS NOTE    [name]   TMH:[id number]  DOB: [birth year]  PCP: Elba Barman, MD    DOA: 03/11/2020 LOS: 5   Brief Narrative   [name] is a 74 y.o. female with medical history significant for chronic dCHF, A-fib on Eliquis, history of CVA, hypothyroidism, iron deficiency anemia, history of diverticulitis who presented to the ED on 03/11/20 with nausea, vomiting and abdominal pain and worsening abdominal distention, and no bowel movement in 5 days.  Onset was in the setting of a spicy meal she'd eaten four days prior and subsequently had few days of nausea/vomiting.    She recently had diverticulitis and underwent a colonoscopy in August due to concerns of possible sigmoid obstruction.  Colonoscopy showed only significant sigmoid edema at that time.  In the ED, afebrile, tachycardic and tachypneic.  Labs significant for leukocytosis of 13.3, sodium 126.  CT abdomen pelvis show moderate diffuse colonic distention with a degree of obstruction at the sigmoid colon possibly secondary to muscular hypertrophy versus stricture.  No active diverticulitis.  General surgery consulted.  On 10/11, patient had Gastrografin enema which showed complete colonic obstruction at the level of the mid to distal sigmoid colon.  Patient was taken to surgery, underwent subtotal colectomy.     Assessment & Plan   Principal Problem:   Large bowel obstruction (HCC) Active Problems:   Atrial fibrillation (HCC)   Hypothyroidism   Abdominal distention   Hyponatremia   Chronic diastolic heart failure (HCC)   Large Bowel Obstruction - present on admission with 5 days constipation, diffuse abdominal pain, worsening distention and obstruction confirmed by Gastrografin enema on 10/11.  Underwent subtotal colectomy on 10/11 with Dr. Luther Bradley  --started having large liquid output via ostomy PLAN: --cont regular diet --Hold MIVF and encourage oral hydration --watch out for excessive  diarrhea --PRN antiemetics and pain control   Hypotension - resolved.  Post-operatively and overnight 10/11-12 had low BP's but maintained MAP's.   Suspect due to anesthesia medications and hypovolemia.  Responded to fluid bolus in PACU.  Hyponatremia - secondary to hypovolemia from decrease PO intake.  Improving with IV fluids. Na 126 on admission >> 128.   --Hold further IVF --encourage oral hydration  A fib with RVR in post-op setting - expect rate to improve with hydration and improved BP, resumption of PO medications.   --Cardiology consulted, Dr. Clayborn Bigness --Transfer to PCU for Amiodarone gtt for rate control given soft BP's PLAN: --cont amiodarone 200 mg daily --d/c digoxin, per cards --Continue PO Cardizem and metoprolol --continue Eliquis --outpatient f/u with cardiology 1-2 weeks after discharge  Chronic diastolic heart failure - compensated on admission, euvolemic.   --Hold Lasix 2/2 low BP   Hypothyroidism -  --cont home Synthyroid    Diet:  Diet Orders (From admission, onward)    Start     Ordered   03/15/20 1437  Diet regular Room service appropriate? Yes; Fluid consistency: Thin  Diet effective now       Question Answer Comment  Room service appropriate? Yes   Fluid consistency: Thin      03/15/20 1436            Code Status: DNR    Subjective 03/16/20    Pt had her desired Mac N Cheese, sweet potatoe and mashed potatoe for dinner last night.  Was eager to start eating regular foods.  Still bothered by her "spitting up", and suggested resuming her home Mucinex.  Ostomy output seemed  more thickened.   Disposition Plan & Communication   DVT prophylaxis: IT:[id number] Code Status: DNR  Family Communication:  Status is: inpatient Dispo:   The patient is from: home Anticipated d/c is to: SNF Anticipated d/c date is: 2-3 days Patient currently is not medically stable to d/c due to: waiting on ostomy output to stabilize.   Consults, Procedures,  Significant Events   Consultants:   General surgery  Procedures:   Subtotal colectomy 03/12/20  Antimicrobials:  Anti-infectives (From admission, onward)   Start     Dose/Rate Route Frequency Ordered Stop   03/12/20 2200  cefoTEtan (CEFOTAN) 2 g in sodium chloride 0.9 % 100 mL IVPB        2 g 200 mL/hr over 30 Minutes Intravenous Every 12 hours 03/12/20 1607 03/12/20 2244   03/12/20 1200  cefoTEtan (CEFOTAN) 2 g in sodium chloride 0.9 % 100 mL IVPB        2 g 200 mL/hr over 30 Minutes Intravenous  Once 03/12/20 1131 03/12/20 1300   03/12/20 1130  sodium chloride 0.9 % with cefoTEtan (CEFOTAN) ADS Med       Note to Pharmacy: Sylvester Harder   : cabinet override      03/12/20 1130 03/12/20 1200   03/12/20 0945  piperacillin-tazobactam (ZOSYN) IVPB 3.375 g  Status:  Discontinued        3.375 g 12.5 mL/hr over 240 Minutes Intravenous Every 8 hours 03/12/20 0940 03/12/20 1607        Objective   Vitals:   03/16/20 0800 03/16/20 1130 03/16/20 1252 03/16/20 1600  BP: (!) 102/59  108/62 108/70  Pulse: 70  60 69  Resp: _0 Temp: (!) 97.4 F (36.3 C)  (!) 97.5 F (36.4 C) 98.3 F (36.8 C)  TempSrc: Oral  Axillary Oral  SpO2: 100% 97% 99% 98%  Weight:      Height:        Intake/Output Summary (Last 24 hours) at 03/16/2020 1806 Last data filed at 03/16/2020 1640 Gross per 24 hour  Intake 720 ml  Output 1250 ml  Net -530 ml   Filed Weights   03/12/20 1109 03/15/20 0554 03/16/20 0339  Weight: 63.5 kg 68.3 kg 67.7 kg    Physical Exam:  Constitutional: NAD, AAOx3 HEENT: conjunctivae and lids normal, EOMI CV: RRR 3+ systolic murmur at upper sternal boarders. Distal pulses +2.  No cyanosis.   RESP: normal respiratory effort  GI: +BS, distended, ostomy bag dark black/brown liquid, seemed more thickened Extremities: No effusions, edema in BLE SKIN: warm, dry and intact Neuro: II - XII grossly intact.  Sensation intact Psych: Normal mood and affect.  Appropriate  judgement and reason    Labs   Data Reviewed: I have personally reviewed following labs and imaging studies  CBC: Recent Labs  Lab 03/12/20 0445 03/13/20 0303 03/14/20 0450 03/15/20 0600 03/16/20 0630  WBC 15.8* 13.0* 13.9* 7.0 8.6  HGB 13.7 12.3 12.4 10.3* 10.2*  HCT 41.1 37.1 37.3 30.2* 30.3*  MCV 80.1 81.0 80.2 80.5 81.0  PLT 253 180 213 164 415   Basic Metabolic Panel: Recent Labs  Lab 03/12/20 0445 03/13/20 0303 03/14/20 0450 03/15/20 0600 03/16/20 0630  NA 128* 132* 127* 128* 127*  K 4.2 3.8 4.0 3.9 3.8  CL 99 103 98 95* 95*  CO2 19* 19* 21* 25 25  GLUCOSE 109* 87 136* 89 94  BUN 26* 23 27* 25* 15  CREATININE 0.68 0.92 0.85 0.74 0.68  CALCIUM 8.0* 7.8* 8.7* 8.3* 8.2*  MG  --  2.1  --  2.1 1.9  PHOS  --  4.5  --   --   --    GFR: Estimated Creatinine Clearance: 53.8 mL/min (by C-G formula based on SCr of 0.68 mg/dL). Liver Function Tests: Recent Labs  Lab 03/11/20 1335  AST 30  ALT 20  ALKPHOS 83  BILITOT 1.5*  PROT 8.1  ALBUMIN 4.1   Recent Labs  Lab 03/11/20 1335  LIPASE 27   No results for input(s): AMMONIA in the last 168 hours. Coagulation Profile: No results for input(s): INR, PROTIME in the last 168 hours. Cardiac Enzymes: No results for input(s): CKTOTAL, CKMB, CKMBINDEX, TROPONINI in the last 168 hours. BNP (last 3 results) No results for input(s): PROBNP in the last 8760 hours. HbA1C: No results for input(s): HGBA1C in the last 72 hours. CBG: Recent Labs  Lab 03/13/20 0748  GLUCAP 85   Lipid Profile: No results for input(s): CHOL, HDL, LDLCALC, TRIG, CHOLHDL, LDLDIRECT in the last 72 hours. Thyroid Function Tests: No results for input(s): TSH, T4TOTAL, FREET4, T3FREE, THYROIDAB in the last 72 hours. Anemia Panel: No results for input(s): VITAMINB12, FOLATE, FERRITIN, TIBC, IRON, RETICCTPCT in the last 72 hours. Sepsis Labs: Recent Labs  Lab 03/11/20 1957 03/12/20 0445  LATICACIDVEN 1.4 1.1    Recent Results (from  the past 240 hour(s))  Respiratory Panel by RT PCR (Flu A&B, Covid) - Nasopharyngeal Swab     Status: None   Collection Time: 03/12/20  1:58 AM   Specimen: Nasopharyngeal Swab  Result Value Ref Range Status   SARS Coronavirus 2 by RT PCR NEGATIVE NEGATIVE Final    Comment: (NOTE) SARS-CoV-2 target nucleic acids are NOT DETECTED.  The SARS-CoV-2 RNA is generally detectable in upper respiratoy specimens during the acute phase of infection. The lowest concentration of SARS-CoV-2 viral copies this assay can detect is 131 copies/mL. A negative result does not preclude SARS-Cov-2 infection and should not be used as the sole basis for treatment or other patient management decisions. A negative result may occur with  improper specimen collection/handling, submission of specimen other than nasopharyngeal swab, presence of viral mutation(s) within the areas targeted by this assay, and inadequate number of viral copies (<131 copies/mL). A negative result must be combined with clinical observations, patient history, and epidemiological information. The expected result is Negative.  Fact Sheet for Patients:  PinkCheek.be  Fact Sheet for Healthcare Providers:  GravelBags.it  This test is no t yet approved or cleared by the [place] FDA and  has been authorized for detection and/or diagnosis of SARS-CoV-2 by FDA under an Emergency Use Authorization (EUA). This EUA will remain  in effect (meaning this test can be used) for the duration of the COVID-19 declaration under Section 564(b)(1) of the Act, 21 U.S.C. section 360bbb-3(b)(1), unless the authorization is terminated or revoked sooner.     Influenza A by PCR NEGATIVE NEGATIVE Final   Influenza B by PCR NEGATIVE NEGATIVE Final    Comment: (NOTE) The Xpert Xpress SARS-CoV-2/FLU/RSV assay is intended as an aid in  the diagnosis of influenza from Nasopharyngeal swab specimens and   should not be used as a sole basis for treatment. Nasal washings and  aspirates are unacceptable for Xpert Xpress SARS-CoV-2/FLU/RSV  testing.  Fact Sheet for Patients: PinkCheek.be  Fact Sheet for Healthcare Providers: GravelBags.it  This test is not yet approved or cleared by the [place] and  has been authorized  for detection and/or diagnosis of SARS-CoV-2 by  FDA under an Emergency Use Authorization (EUA). This EUA will remain  in effect (meaning this test can be used) for the duration of the  Covid-19 declaration under Section 564(b)(1) of the Act, 21  U.S.C. section 360bbb-3(b)(1), unless the authorization is  terminated or revoked. Performed at [facility], [address] [place], [place] [address]       Imaging Studies   No results found.   Medications   Scheduled Meds: . [START ON 03/17/2020] amiodarone  200 mg Oral Daily  . apixaban  5 mg Oral BID  . Chlorhexidine Gluconate Cloth  6 each Topical Daily  . diltiazem  240 mg Oral Daily  . feeding supplement  237 mL Oral BID BM  . folic acid  1 mg Oral Daily  . levothyroxine  75 mcg Oral QAC breakfast  . mouth rinse  15 mL Mouth Rinse BID  . metoprolol succinate  50 mg Oral Daily  . pantoprazole  40 mg Oral Daily  . potassium chloride  10 mEq Oral Daily  . sodium chloride flush  10-40 mL Intracatheter Q12H   Continuous Infusions:      LOS: 5 days     Enzo Bi, md [facility]  03/16/2020, 6:06 PM

## 2020-03-16 NOTE — Progress Notes (Signed)
[facility] SURGICAL PROGRESS NOTE  Hospital Day(s): 5.   Post op day(s): 4 Days Post-Op.   Interval History:  Patient seen and examined no acute events or new complaints overnight.  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 in last 24 hours Working with therapies; recommendations are SNF   Vital signs in last 24 hours: [min-max] current  Temp:  [97.4 F (36.3 C)-98.1 F (36.7 C)] 97.8 F (36.6 C) (10/15 0339) Pulse Rate:  [49-98] 61 (10/15 0339) Resp:  [17-24] 20 (10/15 0339) BP: (92-123)/(55-70) 92/55 (10/15 0339) SpO2:  [89 %-100 %] 98 % (10/15 0339) Weight:  [67.7 kg] 67.7 kg (10/15 0339)     Height: 5\' 5"  (165.1 cm) Weight: 67.7 kg BMI (Calculated): 24.84   Intake/Output last 2 shifts:  10/Lauren 0701 - 10/15 0700 In: 240 [P.O.:240] Out: 1000 [Urine:300; Stool:700]   Physical Exam:  Constitutional: alert, cooperative and no distress  Respiratory: breathing non-labored at rest, on [place] Cardiovascular:Irregularly irregular, appears in A flutter on monitor, regular rate Gastrointestinal:Soft, diffuse soreness, non-distended, no rebound/guarding. 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  Labs:  CBC Latest Ref Rng & Units 03/16/2020 10/Lauren/2021 03/14/2020  WBC 4.0 - 10.5 K/uL 8.6 7.0 13.9(H)  Hemoglobin 12.0 - 15.0 g/dL 10.2(L) 10.3(L) 12.4  Hematocrit 36 - 46 % 30.3(L) 30.2(L) 37.3  Platelets 150 - 400 K/uL 184 164 213   CMP Latest Ref Rng & Units 03/16/2020  10/Lauren/2021 03/14/2020  Glucose 70 - 99 mg/dL 94 89 03/16/2020)  BUN 8 - 23 mg/dL 15 160(V) 37(T)  Creatinine 0.44 - 1.00 mg/dL 06(Y 6.94 8.54  Sodium 135 - 145 mmol/L 127(L) 128(L) 127(L)  Potassium 3.5 - 5.1 mmol/L 3.8 3.9 4.0  Chloride 98 - 111 mmol/L 95(L) 95(L) 98  CO2 22 - 32 mmol/L 25 25 21(L)  Calcium 8.9 - 10.3 mg/dL 8.2(L) 8.3(L) 8.7(L)  Total Protein 6.5 - 8.1 g/dL - - -  Total Bilirubin 0.3 - 1.2 mg/dL - - -  Alkaline Phos 38 - 126 U/L - - -  AST 15 - 41 U/L - - -  ALT 0 - 44 U/L - - -     Imaging studies: No new pertinent imaging studies   Assessment/Plan:  74 y.o. Hood 4 Days Post-Op s/p subtotal colectomy and end ileostomyfor complete colonic obstruction at the level of the sigmoid with marked cecal distension intra-operatively, complicated by pertinent comorbidities includingAfib, CHF.   - Okay to continue soft diet as toerlates  - Monitor abdominal examination - Monitor on-going ileostomy function closely; once ileostomy function consistent she will most likely need anti-diarrheals - Pain control prn; antiemetics prn - WOC RN for ostomy teaching assistance - Monitor leukocytosis, renal function, electrolytes             - Monitor H&H; suspect dilutional anemia at the moment -Appreciate cardiology assistance - Mobilization as feasible;PT following; recommending SNF - Further management per primary service; we will follow  All of the above findings and recommendations were discussed with the patient, and the medical team, and  all of patient's questions were answered to her expressed satisfaction.  -- Lynden Oxford, PA-C [facility] 03/16/2020, 7:51 AM [phone] M-F: 7am - 4pm

## 2020-03-16 NOTE — Progress Notes (Signed)
PT Cancellation Note  Patient Details Name: [name] MRN: [id number] DOB: 07/29/1945   Cancelled Treatment:    Reason Eval/Treat Not Completed: Other (comment) Attempted to see pt x2 this date, first attempt she was eating and kindly requested PT to come back, on second attempt she was on the phone with her prayer partner and asked PT to try back later.  Will attempt as schedule allows.  Malachi Pro, DPT 03/16/2020, 4:10 PM

## 2020-03-16 NOTE — Care Management Important Message (Signed)
Important Message  Patient Details  Name: [name] MRN: [id number] Date of Birth: [birth year]   Medicare Important Message Given:  Yes     [name] 03/16/2020, 2:14 PM

## 2020-03-16 NOTE — Progress Notes (Signed)
[facility] Cardiology    SUBJECTIVE: Patient states to be doing reasonably well no palpitation no tachycardia recovering well from abdominal surgery and learning to use her colostomy how to care for it   Vitals:   03/16/20 0339 03/16/20 0400 03/16/20 0600 03/16/20 0800  BP: (!) 92/55 (!) 92/58 (!) 102/54 (!) 102/59  Pulse: 61 67 (!) 52 70  Resp: 20 (!) 21 (!) 21 16  Temp: 97.8 F (36.6 C)   (!) 97.4 F (36.3 C)  TempSrc: Oral   Oral  SpO2: 98% 98% 98% 100%  Weight: 67.7 kg     Height:         Intake/Output Summary (Last 24 hours) at 03/16/2020 0912 Last data filed at 03/16/2020 0654 Gross per 24 hour  Intake 240 ml  Output 950 ml  Net -710 ml      PHYSICAL EXAM  General: Well developed, well nourished, in no acute distress HEENT:  Normocephalic and atramatic Neck:  No JVD.  Lungs: Clear bilaterally to auscultation and percussion. Heart: HRRR . Normal S1 and S2 without gallops or murmurs.  Abdomen: Bowel sounds are positive, abdomen soft and non-tender colostomy in place Msk:  Back normal, normal gait. Normal strength and tone for age. Extremities: No clubbing, cyanosis or edema.   Neuro: Alert and oriented X 3. Psych:  Good affect, responds appropriately   LABS: Basic Metabolic Panel: Recent Labs    03/15/20 0600 03/16/20 0630  NA 128* 127*  K 3.9 3.8  CL 95* 95*  CO2 25 25  GLUCOSE 89 94  BUN 25* 15  CREATININE 0.74 0.68  CALCIUM 8.3* 8.2*  MG 2.1 1.9   Liver Function Tests: No results for input(s): AST, ALT, ALKPHOS, BILITOT, PROT, ALBUMIN in the last 72 hours. No results for input(s): LIPASE, AMYLASE in the last 72 hours. CBC: Recent Labs    03/15/20 0600 03/16/20 0630  WBC 7.0 8.6  HGB 10.3* 10.2*  HCT 30.2* 30.3*  MCV 80.5 81.0  PLT 164 184   Cardiac Enzymes: No results for input(s): CKTOTAL, CKMB, CKMBINDEX, TROPONINI in the last 72 hours. BNP: Invalid input(s): POCBNP D-Dimer: No results for input(s): DDIMER in the last 72  hours. Hemoglobin A1C: No results for input(s): HGBA1C in the last 72 hours. Fasting Lipid Panel: No results for input(s): CHOL, HDL, LDLCALC, TRIG, CHOLHDL, LDLDIRECT in the last 72 hours. Thyroid Function Tests: No results for input(s): TSH, T4TOTAL, T3FREE, THYROIDAB in the last 72 hours.  Invalid input(s): FREET3 Anemia Panel: No results for input(s): VITAMINB12, FOLATE, FERRITIN, TIBC, IRON, RETICCTPCT in the last 72 hours.  No results found.   Echo preserved left ventricular function  TELEMETRY: Normal sinus rhythm rate of about 65  ASSESSMENT AND PLAN:  Principal Problem:   Large bowel obstruction (HCC) Active Problems:   Atrial fibrillation (HCC)   Hypothyroidism   Abdominal distention   Hyponatremia   Chronic diastolic heart failure (HCC)   Plan Continue current conservative management rate control for atrial fibrillation has since converted to sinus Continue blood pressure management and control Agree with surgical follow-up and management for bowel obstruction Continue to correct electrolytes including hyponatremia Slowly increase activity with physical therapy have the patient ambulate Recommend conservative cardiac involvement  Discontinue digoxin and maintain amiodarone metoprolol diltiazem Follow-up with cardiology as an outpatient We will sign off now on current dosages have the patient follow-up with cardiology 1 to 2 weeks   Alwyn Pea, MD 03/16/2020 9:12 AM

## 2020-03-16 NOTE — Progress Notes (Signed)
Soft bps this morning, per assessment this is patient's baseline. Continue with all cardiac meds per Dr. Fran Lowes; also cardiac monitoring discontinued.

## 2020-03-16 NOTE — Progress Notes (Signed)
Mobility Specialist - Progress Note   03/16/20 1152  Mobility  Activity Refused mobility  Mobility performed by Mobility specialist    Pt refused mobility session. States "I just want to sleep". Will re-attempt session at a later date/time.    Shereece Wellborn Mobility Specialist  03/16/20, 11:52 AM

## 2020-03-17 DIAGNOSIS — K56609 Unspecified intestinal obstruction, unspecified as to partial versus complete obstruction: Secondary | ICD-10-CM | POA: Diagnosis not present

## 2020-03-17 LAB — BASIC METABOLIC PANEL
Anion gap: 7 (ref 5–15)
BUN: 10 mg/dL (ref 8–23)
CO2: 26 mmol/L (ref 22–32)
Calcium: 8 mg/dL — ABNORMAL LOW (ref 8.9–10.3)
Chloride: 92 mmol/L — ABNORMAL LOW (ref 98–111)
Creatinine, Ser: 0.47 mg/dL (ref 0.44–1.00)
GFR, Estimated: >60 mL/min (ref >60)
Glucose, Bld: 96 mg/dL (ref 70–99)
Potassium: 3.8 mmol/L (ref 3.5–5.1)
Sodium: 125 mmol/L — ABNORMAL LOW (ref 135–145)

## 2020-03-17 LAB — CBC
HCT: 32.2 % — ABNORMAL LOW (ref 36.0–46.0)
Hemoglobin: 10.8 g/dL — ABNORMAL LOW (ref 12.0–15.0)
MCH: 26.9 pg (ref 26.0–34.0)
MCHC: 33.5 g/dL (ref 30.0–36.0)
MCV: 80.3 fL (ref 80.0–100.0)
Platelets: 210 10*3/uL (ref 150–400)
RBC: 4.01 MIL/uL (ref 3.87–5.11)
RDW: 16.8 % — ABNORMAL HIGH (ref 11.5–15.5)
WBC: 12.3 10*3/uL — ABNORMAL HIGH (ref 4.0–10.5)
nRBC: 0 % (ref 0.0–0.2)

## 2020-03-17 LAB — MAGNESIUM: Magnesium: 1.8 mg/dL (ref 1.7–2.4)

## 2020-03-17 MED ORDER — GUAIFENESIN ER 600 MG PO TB12
600.0000 mg | ORAL_TABLET | Freq: Two times a day (BID) | ORAL | Status: DC
  Administered 2020-03-17 – 2020-03-20 (×6): 600 mg via ORAL
  Filled 2020-03-17 (×7): qty 1

## 2020-03-17 MED ORDER — SODIUM CHLORIDE 0.9 % IV SOLN: INTRAVENOUS | Status: AC

## 2020-03-17 MED ORDER — PSYLLIUM 95 % PO PACK
1.0000 | PACK | Freq: Three times a day (TID) | ORAL | Status: DC
  Administered 2020-03-17 – 2020-03-18 (×2): 1 via ORAL
  Filled 2020-03-17 (×6): qty 1

## 2020-03-17 NOTE — Progress Notes (Signed)
PROGRESS NOTE    [name]   YIR:[id number]  DOB: 11-19-1945  PCP: Mickel Fuchs, MD    DOA: 03/11/2020 LOS: 6   Brief Narrative   [name] is a 74 y.o. female with medical history significant for chronic dCHF, A-fib on Eliquis, history of CVA, hypothyroidism, iron deficiency anemia, history of diverticulitis who presented to the ED on 03/11/20 with nausea, vomiting and abdominal pain and worsening abdominal distention, and no bowel movement in 5 days.  Onset was in the setting of a spicy meal she'd eaten four days prior and subsequently had few days of nausea/vomiting.    She recently had diverticulitis and underwent a colonoscopy in August due to concerns of possible sigmoid obstruction.  Colonoscopy showed only significant sigmoid edema at that time.  In the ED, afebrile, tachycardic and tachypneic.  Labs significant for leukocytosis of 13.3, sodium 126.  CT abdomen pelvis show moderate diffuse colonic distention with a degree of obstruction at the sigmoid colon possibly secondary to muscular hypertrophy versus stricture.  No active diverticulitis.  General surgery consulted.  On 10/11, patient had Gastrografin enema which showed complete colonic obstruction at the level of the mid to distal sigmoid colon.  Patient was taken to surgery, underwent subtotal colectomy.     Assessment & Plan   Principal Problem:   Large bowel obstruction (HCC) Active Problems:   Atrial fibrillation (HCC)   Hypothyroidism   Abdominal distention   Hyponatremia   Chronic diastolic heart failure (HCC)   Large Bowel Obstruction - present on admission with 5 days constipation, diffuse abdominal pain, worsening distention and obstruction confirmed by Gastrografin enema on 10/11.  Underwent subtotal colectomy on 10/11 with Dr. Toula Moos  --started having large liquid output via ostomy PLAN: --cont regular diet --Hold MIVF and encourage oral hydration --watch out for excessive  diarrhea --PRN antiemetics and pain control  --start metamucil for stool bulking.    Hypotension - resolved.  Post-operatively and overnight 10/11-12 had low BP's but maintained MAP's.   Suspect due to anesthesia medications and hypovolemia.  Responded to fluid bolus in PACU.  Hyponatremia - worsened secondary to hypovolemia from decrease PO intake.  Improving with IV fluids, but trended down again after IVF d/c'ed. --Na 125 today --NS@100  for 10 hours  A fib with RVR in post-op setting - expect rate to improve with hydration and improved BP, resumption of PO medications.   --Cardiology consulted, Dr. Juliann Pares --Transfer to PCU for Amiodarone gtt for rate control given soft BP's, also received digoxin (since d/c'ed) --currently rate controlled PLAN: --cont amiodarone 200 mg daily --cont cardizem 250 mg daily and Toprol 50 mg daily --continue Eliquis --outpatient f/u with cardiology 1-2 weeks after discharge  Chronic diastolic heart failure - compensated on admission, euvolemic.   --cont to hold lasix due to low BP   Hypothyroidism -  --cont home Synthyroid    Diet:  Diet Orders (From admission, onward)    Start     Ordered   03/15/20 1437  Diet regular Room service appropriate? Yes; Fluid consistency: Thin  Diet effective now       Question Answer Comment  Room service appropriate? Yes   Fluid consistency: Thin      03/15/20 1436            Code Status: DNR    Subjective 03/17/20    Nursing reported pt vomited this morning, apparently after eating too much breakfast.  BM started to thicken.  Disposition Plan & Communication   DVT prophylaxis: ZO:XWRUEAVOn:Eliquis Code Status: DNR  Family Communication:  Status is: inpatient Dispo:   The patient is from: home Anticipated d/c is to: SNF Anticipated d/c date is: 1-2 days Patient currently is not medically stable to d/c due to: waiting on ostomy output to stabilize, and Na needs to improve.   Consults,  Procedures, Significant Events   Consultants:   General surgery  Procedures:   Subtotal colectomy 03/12/20  Antimicrobials:  Anti-infectives (From admission, onward)   Start     Dose/Rate Route Frequency Ordered Stop   03/12/20 2200  cefoTEtan (CEFOTAN) 2 g in sodium chloride 0.9 % 100 mL IVPB        2 g 200 mL/hr over 30 Minutes Intravenous Every 12 hours 03/12/20 1607 03/12/20 2244   03/12/20 1200  cefoTEtan (CEFOTAN) 2 g in sodium chloride 0.9 % 100 mL IVPB        2 g 200 mL/hr over 30 Minutes Intravenous  Once 03/12/20 1131 03/12/20 1300   03/12/20 1130  sodium chloride 0.9 % with cefoTEtan (CEFOTAN) ADS Med       Note to Pharmacy: Mikey BussingLewis, Cindy   : cabinet override      03/12/20 1130 03/12/20 1200   03/12/20 0945  piperacillin-tazobactam (ZOSYN) IVPB 3.375 g  Status:  Discontinued        3.375 g 12.5 mL/hr over 240 Minutes Intravenous Every 8 hours 03/12/20 0940 03/12/20 1607        Objective   Vitals:   03/17/20 0027 03/17/20 0414 03/17/20 0700 03/17/20 1149  BP: (!) 92/45 100/60  (!) 102/58  Pulse: 78 61  77  Resp: 19 20  18   Temp: 97.7 F (36.5 C) 97.9 F (36.6 C)  (!) 97.5 F (36.4 C)  TempSrc:  Oral    SpO2: 92% 92% 92% 100%  Weight:      Height:        Intake/Output Summary (Last 24 hours) at 03/17/2020 1731 Last data filed at 03/17/2020 1012 Gross per 24 hour  Intake 376 ml  Output 275 ml  Net 101 ml   Filed Weights   03/12/20 1109 03/15/20 0554 03/16/20 0339  Weight: 63.5 kg 68.3 kg 67.7 kg    Physical Exam:  Constitutional: NAD, AAOx3 HEENT: conjunctivae and lids normal, EOMI CV: RRR 3+ systolic murmur. Distal pulses +2.  No cyanosis.   RESP: CTA B/L, normal respiratory effort, on RA GI: +BS, somewhat distended, black/brown liquid stool output more thickened Extremities: No effusions, edema in BLE SKIN: warm, dry and intact Neuro: II - XII grossly intact.  Sensation intact Psych: Normal mood and affect.      Labs   Data Reviewed: I  have personally reviewed following labs and imaging studies  CBC: Recent Labs  Lab 03/13/20 0303 03/14/20 0450 03/15/20 0600 03/16/20 0630 03/17/20 0533  WBC 13.0* 13.9* 7.0 8.6 12.3*  HGB 12.3 12.4 10.3* 10.2* 10.8*  HCT 37.1 37.3 30.2* 30.3* 32.2*  MCV 81.0 80.2 80.5 81.0 80.3  PLT 180 213 164 184 210   Basic Metabolic Panel: Recent Labs  Lab 03/13/20 0303 03/14/20 0450 03/15/20 0600 03/16/20 0630 03/17/20 0533  NA 132* 127* 128* 127* 125*  K 3.8 4.0 3.9 3.8 3.8  CL 103 98 95* 95* 92*  CO2 19* 21* 25 25 26   GLUCOSE 87 136* 89 94 96  BUN 23 27* 25* 15 10  CREATININE 0.92 0.85 0.74 0.68 0.47  CALCIUM 7.8* 8.7* 8.3*  8.2* 8.0*  MG 2.1  --  2.1 1.9 1.8  PHOS 4.5  --   --   --   --    GFR: Estimated Creatinine Clearance: 53.8 mL/min (by C-G formula based on SCr of 0.47 mg/dL). Liver Function Tests: Recent Labs  Lab 03/11/20 1335  AST 30  ALT 20  ALKPHOS 83  BILITOT 1.5*  PROT 8.1  ALBUMIN 4.1   Recent Labs  Lab 03/11/20 1335  LIPASE 27   No results for input(s): AMMONIA in the last 168 hours. Coagulation Profile: No results for input(s): INR, PROTIME in the last 168 hours. Cardiac Enzymes: No results for input(s): CKTOTAL, CKMB, CKMBINDEX, TROPONINI in the last 168 hours. BNP (last 3 results) No results for input(s): PROBNP in the last 8760 hours. HbA1C: No results for input(s): HGBA1C in the last 72 hours. CBG: Recent Labs  Lab 03/13/20 0748  GLUCAP 85   Lipid Profile: No results for input(s): CHOL, HDL, LDLCALC, TRIG, CHOLHDL, LDLDIRECT in the last 72 hours. Thyroid Function Tests: No results for input(s): TSH, T4TOTAL, FREET4, T3FREE, THYROIDAB in the last 72 hours. Anemia Panel: No results for input(s): VITAMINB12, FOLATE, FERRITIN, TIBC, IRON, RETICCTPCT in the last 72 hours. Sepsis Labs: Recent Labs  Lab 03/11/20 1957 03/12/20 0445  LATICACIDVEN 1.4 1.1    Recent Results (from the past 240 hour(s))  Respiratory Panel by RT PCR (Flu  A&B, Covid) - Nasopharyngeal Swab     Status: None   Collection Time: 03/12/20  1:58 AM   Specimen: Nasopharyngeal Swab  Result Value Ref Range Status   SARS Coronavirus 2 by RT PCR NEGATIVE NEGATIVE Final    Comment: (NOTE) SARS-CoV-2 target nucleic acids are NOT DETECTED.  The SARS-CoV-2 RNA is generally detectable in upper respiratoy specimens during the acute phase of infection. The lowest concentration of SARS-CoV-2 viral copies this assay can detect is 131 copies/mL. A negative result does not preclude SARS-Cov-2 infection and should not be used as the sole basis for treatment or other patient management decisions. A negative result may occur with  improper specimen collection/handling, submission of specimen other than nasopharyngeal swab, presence of viral mutation(s) within the areas targeted by this assay, and inadequate number of viral copies (<131 copies/mL). A negative result must be combined with clinical observations, patient history, and epidemiological information. The expected result is Negative.  Fact Sheet for Patients:  https://www.moore.com/  Fact Sheet for Healthcare Providers:  https://www.young.biz/  This test is no t yet approved or cleared by the [place] FDA and  has been authorized for detection and/or diagnosis of SARS-CoV-2 by FDA under an Emergency Use Authorization (EUA). This EUA will remain  in effect (meaning this test can be used) for the duration of the COVID-19 declaration under Section 564(b)(1) of the Act, 21 U.S.C. section 360bbb-3(b)(1), unless the authorization is terminated or revoked sooner.     Influenza A by PCR NEGATIVE NEGATIVE Final   Influenza B by PCR NEGATIVE NEGATIVE Final    Comment: (NOTE) The Xpert Xpress SARS-CoV-2/FLU/RSV assay is intended as an aid in  the diagnosis of influenza from Nasopharyngeal swab specimens and  should not be used as a sole basis for treatment. Nasal  washings and  aspirates are unacceptable for Xpert Xpress SARS-CoV-2/FLU/RSV  testing.  Fact Sheet for Patients: https://www.moore.com/  Fact Sheet for Healthcare Providers: https://www.young.biz/  This test is not yet approved or cleared by the [place] FDA and  has been authorized for detection and/or diagnosis of SARS-CoV-2  by  FDA under an Emergency Use Authorization (EUA). This EUA will remain  in effect (meaning this test can be used) for the duration of the  Covid-19 declaration under Section 564(b)(1) of the Act, 21  U.S.C. section 360bbb-3(b)(1), unless the authorization is  terminated or revoked. Performed at [facility], [address] [place], [place] [address]       Imaging Studies   No results found.   Medications   Scheduled Meds: . amiodarone  200 mg Oral Daily  . apixaban  5 mg Oral BID  . Chlorhexidine Gluconate Cloth  6 each Topical Daily  . diltiazem  240 mg Oral Daily  . feeding supplement  237 mL Oral BID BM  . folic acid  1 mg Oral Daily  . guaiFENesin  600 mg Oral BID  . levothyroxine  75 mcg Oral QAC breakfast  . mouth rinse  15 mL Mouth Rinse BID  . metoprolol succinate  50 mg Oral Daily  . pantoprazole  40 mg Oral Daily  . potassium chloride  10 mEq Oral Daily  . psyllium  1 packet Oral TID WC  . sodium chloride flush  10-40 mL Intracatheter Q12H   Continuous Infusions:      LOS: 6 days     Darlin Priestly, md [facility]  03/17/2020, 5:31 PM

## 2020-03-17 NOTE — Progress Notes (Signed)
Mobility Specialist - Progress Note   03/17/20 1400  Mobility  Activity Sat and stood x 3  Range of Motion/Exercises Right leg;Left leg (straight leg raises, hip isometrics, seated/standing march)  Level of Assistance Moderate assist, patient does 50-74%  Assistive Device Front wheel walker  Distance Ambulated (ft) 0 ft  Mobility Response Tolerated well  Mobility performed by Mobility specialist  $Mobility charge 1 Mobility    Pre-mobility: 75 HR, 97% SpO2 Post-mobility: 82 HR, 97% SpO2    Pt was sitting in bed upon arrival. Pt agreed to session. Pt c/o pain in abdomen and back area, rating pain a "5/10". Pt was able to get EOB with modA and began c/o dizziness. Pt performed seated exercises: straight leg raises, hip isometrics, and seated march for 20 secs. Pt stood to RW x3 with modA on first 2 attempts & minA on 3rd attempt. Pt was able to tolerate standing march for 10 secs with minA, however, pt would not raise feet completely off of floor. Pt was able to transition from EOB-supine with modA this date. Overall, pt tolerated session well. Pt was left in bed with alarm set and all needs in reach.    Filiberto Pinks Mobility Specialist 03/17/20, 2:24 PM

## 2020-03-17 NOTE — Progress Notes (Signed)
Patient resting comfortably in bed throughout the shift, will continue to check on patient needs.

## 2020-03-17 NOTE — TOC Progression Note (Signed)
Transition of Care [facility]) - Progression Note    Patient Details  Name: [name] MRN: [id number] Date of Birth: Sep 05, 1945  Transition of Care [facility]) CM/SW Contact  Ashley Royalty Lutricia Feil, RN Phone Number: 03/17/2020, 4:16 PM  Clinical Narrative:    Received notification of a request to follow up with the son [name]) to verified placement facilities. Pt has been approved for [facility]. RN attempted outreach call however only able to leave a HIPAA approved voice message requesting a call back. Also attempted to speak with pt however provider currently at bedside and [facility] RN unable to speak in detail with pt concerning family's concerning placement facility.  Addendum: Spoke with pt and verify she has been accepted a [facility] when she is ready for discharge. Pt states she will update her son [name]).   Will remain available concerning ongoing discharge orders.    Expected Discharge Plan: Skilled Nursing Facility Barriers to Discharge: Continued Medical Work up  Expected Discharge Plan and Services Expected Discharge Plan: Skilled Nursing Facility In-house Referral: NA   Post Acute Care Choice: Skilled Nursing Facility Living arrangements for the past 2 months: Single Family Home                                       Social Determinants of Health (SDOH) Interventions    Readmission Risk Interventions No flowsheet data found.

## 2020-03-17 NOTE — Progress Notes (Signed)
03/17/2020  Subjective: Patient is 5 Days Post-Op s/p subtotal colectomy for large bowel obstruction.  No acute events.  Patient getting out of bed to chair with PT during my evaluation this morning.  Reports some pain, but has been doing well.  Tolerating regular diet, having good ostomy function.  Vital signs: Temp:  [97.5 F (36.4 C)-98.3 F (36.8 C)] 97.5 F (36.4 C) (10/16 1149) Pulse Rate:  [60-78] 77 (10/16 1149) Resp:  [16-20] 18 (10/16 1149) BP: (92-108)/(45-70) 102/58 (10/16 1149) SpO2:  [92 %-100 %] 100 % (10/16 1149)   Intake/Output: 10/15 0701 - 10/16 0700 In: 500 [P.O.:480; I.V.:20] Out: 675 [Urine:200; Stool:475] Last BM Date: 03/17/20  Physical Exam: Constitutional: No acute distress Abdomen:  Soft, non-distended, appropriately tender to palpation.  Midline incision is clean, dry, intact with staples.  RLQ ostomy pink/viable, with liquid/mushy brown stool in bag.  Labs:  Recent Labs    03/16/20 0630 03/17/20 0533  WBC 8.6 12.3*  HGB 10.2* 10.8*  HCT 30.3* 32.2*  PLT 184 210   Recent Labs    03/16/20 0630 03/17/20 0533  NA 127* 125*  K 3.8 3.8  CL 95* 92*  CO2 25 26  GLUCOSE 94 96  BUN 15 10  CREATININE 0.68 0.47  CALCIUM 8.2* 8.0*   No results for input(s): LABPROT, INR in the last 72 hours.  Imaging: No results found.  Assessment/Plan: This is a 74 y.o. female s/p subtotal colectomy  --Continue regular diet as tolerated. --Continue working with physical therapy --May add metamucil/psyllium to help bulk the stool.  If this is not enough, may need imodium as outpatient,  --From surgical standpoint, ok to discharge if medically cleared and if there's bed availability at SNF.   Howie Ill, MD [facility]

## 2020-03-17 NOTE — Progress Notes (Signed)
Physical Therapy Treatment Patient Details Name: [name] MRN: [id number] DOB: 08/07/1945 Today's Date: 03/17/2020    History of Present Illness Pt is a 74yo F admitted to [facility] on 10/10 for abdominal distention, nausea, and vomiting secondary to a large bowel obstruction. Pt with significant PMH including: Afib, CHF, cholecystectomy with recent colonoscopy, hx CVA, hypothyroidism. 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 Claudine Mouton. Pt transported to cardiac telemetry floor secondary to Afib with RVR.    PT Comments    Pt tolerated treatment well today, but continues to be limited with further mobility due to vitals and decreased activity tolerance. Pt with tachycardia post ambulation with HR ranging from 125-225 bpm, however, O2 remained >90% on RA; RN notified of vitals. Pt with c/o SOB and fatigue during short distance ambulation, with RPE of 4-6/10 indicating "moderate activity." Pt will continue to benefit from skilled acute PT services to address deficits for return to baseline function. Will continue to recommend SNF at DC to address deficits prior to return home.      Follow Up Recommendations  SNF     Equipment Recommendations  Other (comment) (Will defer to postacute facility; pt may benefit from 3in1, shower chair, and RW at home)    Recommendations for Other Services       Precautions / Restrictions Precautions Precautions: Fall Precaution Comments: Abdominal sx; log roll for bed mobility Restrictions Weight Bearing Restrictions: No    Mobility  Bed Mobility Overal bed mobility: Needs Assistance Bed Mobility: Supine to Sit     Supine to sit: Mod assist;HOB elevated     General bed mobility comments: Mod assist for trunk and hip facilitation to sit EOB with HOB elevated; multimodal cues for sequencing and hand placement to facilitate transfer  Transfers Overall transfer level: Needs assistance Equipment used: Rolling walker (2  wheeled)   Sit to Stand: Mod assist;From elevated surface         General transfer comment: Mod assist to stand from EOB with RW; verbal cues for hand placement to ensure safety with transfer  Ambulation/Gait Ambulation/Gait assistance: Min guard Gait Distance (Feet): 12 Feet Assistive device: Rolling walker (2 wheeled)       General Gait Details: Pt required CGA for safety to ambulate in room with RW. Pt demonstrated slowed cadence, step to gait pattern, forward flexed posture, and decreased walker proximity. Multimodal cues provided for RW and walker negotiation.   Stairs             Wheelchair Mobility    Modified Rankin (Stroke Patients Only)       Balance Overall balance assessment: Needs assistance Sitting-balance support: Bilateral upper extremity supported;Feet supported Sitting balance-Leahy Scale: Fair Sitting balance - Comments: Fair seated balance at EOB and on BSC with BUE/BLE support.   Standing balance support: During functional activity;Bilateral upper extremity supported Standing balance-Leahy Scale: Poor Standing balance comment: Poor standing balance with BUE on RW, requiring CGA for safety                            Cognition Arousal/Alertness: Awake/alert;Lethargic Behavior During Therapy: WFL for tasks assessed/performed Overall Cognitive Status: No family/caregiver present to determine baseline cognitive functioning                                 General Comments: Pt reports STM deficit. Pt presents with slowed  processing.      Exercises Other Exercises Other Exercises: Pt able to perform x15 BLE exercises including: ankle pumps, heel slides, and glute sets. Pt required mod cueing for continuation of exercise due to distraction.    General Comments General comments (skin integrity, edema, etc.): abdominal incision and ileostomy bag      Pertinent Vitals/Pain Pain Assessment: Faces Faces Pain Scale: Hurts  even more Pain Location: abdominal incision; increased pain behaviors with mobility Pain Intervention(s): Limited activity within patient's tolerance;Monitored during session;Repositioned    Home Living                      Prior Function            PT Goals (current goals can now be found in the care plan section) Progress towards PT goals: Progressing toward goals    Frequency    Min 2X/week      PT Plan Current plan remains appropriate    Co-evaluation              AM-PAC PT "6 Clicks" Mobility   Outcome Measure  Help needed turning from your back to your side while in a flat bed without using bedrails?: A Little Help needed moving from lying on your back to sitting on the side of a flat bed without using bedrails?: A Lot Help needed moving to and from a bed to a chair (including a wheelchair)?: A Little Help needed standing up from a chair using your arms (e.g., wheelchair or bedside chair)?: A Lot Help needed to walk in hospital room?: A Little Help needed climbing 3-5 steps with a railing? : A Lot 6 Click Score: 15    End of Session Equipment Utilized During Treatment: Gait belt Activity Tolerance: Patient limited by fatigue;Patient limited by pain Patient left: with call bell/phone within reach;with bed alarm set;in chair Nurse Communication: Mobility status PT Visit Diagnosis: Unsteadiness on feet (R26.81);Muscle weakness (generalized) (M62.81)     Time: 2202-5427 PT Time Calculation (min) (ACUTE ONLY): 25 min  Charges:  $Therapeutic Exercise: 8-22 mins $Therapeutic Activity: 8-22 mins                     Vira Blanco, PT, DPT 1:10 PM,03/17/20

## 2020-03-17 NOTE — Plan of Care (Signed)
Continuing with plan of care. 

## 2020-03-18 DIAGNOSIS — K56609 Unspecified intestinal obstruction, unspecified as to partial versus complete obstruction: Secondary | ICD-10-CM | POA: Diagnosis not present

## 2020-03-18 LAB — BASIC METABOLIC PANEL
Anion gap: 8 (ref 5–15)
BUN: 12 mg/dL (ref 8–23)
CO2: 23 mmol/L (ref 22–32)
Calcium: 7.9 mg/dL — ABNORMAL LOW (ref 8.9–10.3)
Chloride: 96 mmol/L — ABNORMAL LOW (ref 98–111)
Creatinine, Ser: 0.59 mg/dL (ref 0.44–1.00)
GFR, Estimated: >60 mL/min (ref >60)
Glucose, Bld: 91 mg/dL (ref 70–99)
Potassium: 4.1 mmol/L (ref 3.5–5.1)
Sodium: 127 mmol/L — ABNORMAL LOW (ref 135–145)

## 2020-03-18 LAB — CBC
HCT: 30.8 % — ABNORMAL LOW (ref 36.0–46.0)
Hemoglobin: 10.4 g/dL — ABNORMAL LOW (ref 12.0–15.0)
MCH: 26.8 pg (ref 26.0–34.0)
MCHC: 33.8 g/dL (ref 30.0–36.0)
MCV: 79.4 fL — ABNORMAL LOW (ref 80.0–100.0)
Platelets: 207 10*3/uL (ref 150–400)
RBC: 3.88 MIL/uL (ref 3.87–5.11)
RDW: 16.8 % — ABNORMAL HIGH (ref 11.5–15.5)
WBC: 13.5 10*3/uL — ABNORMAL HIGH (ref 4.0–10.5)
nRBC: 0 % (ref 0.0–0.2)

## 2020-03-18 LAB — URINALYSIS, ROUTINE W REFLEX MICROSCOPIC
Bacteria, UA: NONE SEEN
Bilirubin Urine: NEGATIVE
Glucose, UA: NEGATIVE mg/dL
Ketones, ur: NEGATIVE mg/dL
Leukocytes,Ua: NEGATIVE
Nitrite: NEGATIVE
Protein, ur: NEGATIVE mg/dL
Specific Gravity, Urine: 1.01 (ref 1.005–1.030)
WBC, UA: NONE SEEN WBC/hpf (ref 0–5)
pH: 5 (ref 5.0–8.0)

## 2020-03-18 LAB — OSMOLALITY: Osmolality: 264 mOsm/kg — ABNORMAL LOW (ref 275–295)

## 2020-03-18 LAB — MAGNESIUM: Magnesium: 1.7 mg/dL (ref 1.7–2.4)

## 2020-03-18 LAB — OSMOLALITY, URINE: Osmolality, Ur: 290 mOsm/kg — ABNORMAL LOW (ref 300–900)

## 2020-03-18 LAB — SODIUM, URINE, RANDOM: Sodium, Ur: <10 mmol/L

## 2020-03-18 MED ORDER — CALCIUM POLYCARBOPHIL 625 MG PO TABS
625.0000 mg | ORAL_TABLET | Freq: Three times a day (TID) | ORAL | Status: DC
  Administered 2020-03-18 – 2020-03-20 (×3): 625 mg via ORAL
  Filled 2020-03-18 (×8): qty 1

## 2020-03-18 NOTE — Plan of Care (Signed)
Continuing with plan of care. 

## 2020-03-18 NOTE — Progress Notes (Signed)
Mobility Specialist - Progress Note   03/18/20 1200  Mobility  Activity Transferred to/from [facility];Ambulated in room  Level of Assistance Contact guard assist, steadying assist  Assistive Device Front wheel walker  Distance Ambulated (ft) 10 ft  Mobility Response Tolerated well  Mobility performed by Mobility specialist  $Mobility charge 1 Mobility    Pre-mobility: 63 HR, 99% SpO2 Post-mobility: 89 HR, 98% SpO2   Pt was sitting on BSC upon arrival. NT was present in room. Pt c/o pain in back and abdomen "4/10". Pt had urinal output, and was able to stand from [facility] with minA. Pt needs minimal assistance performing hygiene tasks. Mobility assisted. Pt was able to ambulate 10' in room to recliner with slow, but steady gait. No noted LOB. Pt c/o dizziness upon getting to recliner. Overall, pt tolerated session well. Pt was left in recliner with all needs in reach and alarm set. Nurse was notified.    Filiberto Pinks Mobility Specialist 03/18/20, 12:06 PM

## 2020-03-18 NOTE — Progress Notes (Signed)
03/18/2020  Subjective: Patient is 6 Days Post-Op subtotal colectomy with end ileostomy.  No acute events overnight.  Patient having good ostomy output and tolerating a diet well.  However she reports still having some discomfort in the midline over her incision.  Her white blood cell count has slowly been trending upwards with it being 8.62 days ago, 12.3 yesterday, and 13.5 today.  Rest of her labs are otherwise unremarkable except for her persistent hyponatremia with sodium of 127.  Has not had any fevers.  Vital signs: Temp:  [97.3 F (36.3 C)-97.8 F (36.6 C)] 97.8 F (36.6 C) (10/17 1117) Pulse Rate:  [75-85] 81 (10/17 1117) Resp:  [16-20] 16 (10/17 1117) BP: (102-112)/(63-73) 104/73 (10/17 1117) SpO2:  [95 %-97 %] 97 % (10/17 1117)   Intake/Output: 10/16 0701 - 10/17 0700 In: 732.8 [P.O.:356; I.V.:376.8] Out: 2060 [Urine:610; Stool:1450] Last BM Date: 03/18/20  Physical Exam: Constitutional: No acute distress Abdomen: Soft, nondistended, with mild tenderness to palpation over the midline and incision.  Incision itself is clean, dry, intact with no erythema with staples in place.  Right lower quadrant ileostomy with pink viable mucosa and liquid/mushy stool in the bag.  Labs:  Recent Labs    03/17/20 0533 03/18/20 0456  WBC 12.3* 13.5*  HGB 10.8* 10.4*  HCT 32.2* 30.8*  PLT 210 207   Recent Labs    03/17/20 0533 03/18/20 0456  NA 125* 127*  K 3.8 4.1  CL 92* 96*  CO2 26 23  GLUCOSE 96 91  BUN 10 12  CREATININE 0.47 0.59  CALCIUM 8.0* 7.9*   No results for input(s): LABPROT, INR in the last 72 hours.  Imaging: No results found.  Assessment/Plan: This is a 74 y.o. female s/p subtotal colectomy with end ileostomy.  -Given the discomfort that she is having in the midline incision and the rising white count, she may need a CT scan of the abdomen pelvis.  First I will obtain a UA which could potentially also be the culprit but if that is negative, then I will  order a CT scan. -In the meantime, continue with her regular diet, pain medications as needed, working with physical therapy and anticoagulation.   Howie Ill, MD [facility]

## 2020-03-18 NOTE — Progress Notes (Signed)
Sepsis screen performed and is negative. Will continue to monitor. 

## 2020-03-18 NOTE — Progress Notes (Signed)
PROGRESS NOTE    [name]   HEN:[id number]  DOB: Sep 02, 1945  PCP: Mickel Fuchs, MD    DOA: 03/11/2020 LOS: 7   Brief Narrative   [name] is a 74 y.o. female with medical history significant for chronic dCHF, A-fib on Eliquis, history of CVA, hypothyroidism, iron deficiency anemia, history of diverticulitis who presented to the ED on 03/11/20 with nausea, vomiting and abdominal pain and worsening abdominal distention, and no bowel movement in 5 days.  Onset was in the setting of a spicy meal she'd eaten four days prior and subsequently had few days of nausea/vomiting.    She recently had diverticulitis and underwent a colonoscopy in August due to concerns of possible sigmoid obstruction.  Colonoscopy showed only significant sigmoid edema at that time.  In the ED, afebrile, tachycardic and tachypneic.  Labs significant for leukocytosis of 13.3, sodium 126.  CT abdomen pelvis show moderate diffuse colonic distention with a degree of obstruction at the sigmoid colon possibly secondary to muscular hypertrophy versus stricture.  No active diverticulitis.  General surgery consulted.  On 10/11, patient had Gastrografin enema which showed complete colonic obstruction at the level of the mid to distal sigmoid colon.  Patient was taken to surgery, underwent subtotal colectomy.     Assessment & Plan   Principal Problem:   Large bowel obstruction (HCC) Active Problems:   Atrial fibrillation (HCC)   Hypothyroidism   Abdominal distention   Hyponatremia   Chronic diastolic heart failure (HCC)   Leukocytosis --WBC trending up. --UA today, neg for infection --monitor for now, may need CT a/p  Large Bowel Obstruction - POA s/p subtotal colectomy with end ileostomy with 5 days constipation, diffuse abdominal pain, worsening distention and obstruction confirmed by Gastrografin enema on 10/11.  Underwent subtotal colectomy on 10/11 with Dr. Toula Moos  --started having large  liquid output via ostomy PLAN: --cont regular diet --Hold MIVF and encourage oral hydration --watch out for excessive diarrhea --PRN antiemetics and pain control  --switch to fibercon for stool bulking  Hypotension - resolved.  Post-operatively and overnight 10/11-12 had low BP's but maintained MAP's.   Suspect due to anesthesia medications and hypovolemia.  Responded to fluid bolus in PACU.  Hyponatremia -  secondary to hypovolemia from decrease PO intake.  Improving with IV fluids, but trended down again after IVF d/c'ed. --urine studies neg for SIADH --encourage oral hydration and nutrition  A fib with RVR in post-op setting - expect rate to improve with hydration and improved BP, resumption of PO medications.   --Cardiology consulted, Dr. Juliann Pares --Transfer to PCU for Amiodarone gtt for rate control given soft BP's, also received digoxin (since d/c'ed) --currently rate controlled PLAN: --cont amiodarone 200 mg daily --cont cardizem 250 mg daily and Toprol 50 mg daily --cont current regimen --continue Eliquis --outpatient f/u with cardiology 1-2 weeks after discharge  Chronic diastolic heart failure - compensated on admission, euvolemic.   --cont to hold lasix due to low BP   Hypothyroidism -  --cont home Synthyroid    Diet:  Diet Orders (From admission, onward)    Start     Ordered   03/15/20 1437  Diet regular Room service appropriate? Yes; Fluid consistency: Thin  Diet effective now       Question Answer Comment  Room service appropriate? Yes   Fluid consistency: Thin      03/15/20 1436            Code Status: DNR  Subjective 03/18/20    Pt has been eating well.  Still liquid output in ostomy.  No fever.  WBC trending up.  No dysuria.  Off O2.     Disposition Plan & Communication   DVT prophylaxis: ZO:XWRUEAVOn:Eliquis Code Status: DNR  Family Communication:  Status is: inpatient Dispo:   The patient is from: home Anticipated d/c is to:  SNF Anticipated d/c date is: 2-3 days Patient currently is not medically stable to d/c due to: waiting on ostomy output to stabilize, workup for leukocytosis.     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  Antimicrobials:  Anti-infectives (From admission, onward)   Start     Dose/Rate Route Frequency Ordered Stop   03/12/20 2200  cefoTEtan (CEFOTAN) 2 g in sodium chloride 0.9 % 100 mL IVPB        2 g 200 mL/hr over 30 Minutes Intravenous Every 12 hours 03/12/20 1607 03/12/20 2244   03/12/20 1200  cefoTEtan (CEFOTAN) 2 g in sodium chloride 0.9 % 100 mL IVPB        2 g 200 mL/hr over 30 Minutes Intravenous  Once 03/12/20 1131 03/12/20 1300   03/12/20 1130  sodium chloride 0.9 % with cefoTEtan (CEFOTAN) ADS Med       Note to Pharmacy: Mikey BussingLewis, Cindy   : cabinet override      03/12/20 1130 03/12/20 1200   03/12/20 0945  piperacillin-tazobactam (ZOSYN) IVPB 3.375 g  Status:  Discontinued        3.375 g 12.5 mL/hr over 240 Minutes Intravenous Every 8 hours 03/12/20 0940 03/12/20 1607        Objective   Vitals:   03/17/20 1950 03/18/20 0014 03/18/20 0312 03/18/20 1117  BP: 112/63 109/63 102/67 104/73  Pulse: 75 77 85 81  Resp: 20 18 18 16   Temp: (!) 97.3 F (36.3 C) (!) 97.5 F (36.4 C) (!) 97.4 F (36.3 C) 97.8 F (36.6 C)  TempSrc: Oral Oral Oral Oral  SpO2: 95% 96% 96% 97%  Weight:      Height:        Intake/Output Summary (Last 24 hours) at 03/18/2020 1711 Last data filed at 03/18/2020 1445 Gross per 24 hour  Intake 616.79 ml  Output 2835 ml  Net -2218.21 ml   Filed Weights   03/12/20 1109 03/15/20 0554 03/16/20 0339  Weight: 63.5 kg 68.3 kg 67.7 kg    Physical Exam:  Constitutional: NAD, AAOx3 HEENT: conjunctivae and lids normal, EOMI CV: RRR 3+ systolic murmur. Distal pulses +2.  No cyanosis.   RESP: CTA B/L, normal respiratory effort  GI: +BS, dark liquid output in  ostomy Extremities: No effusions, edema in BLE SKIN: warm, dry and intact Neuro: II - XII grossly intact.  Sensation intact Psych: Normal mood and affect.      Labs   Data Reviewed: I have personally reviewed following labs and imaging studies  CBC: Recent Labs  Lab 03/14/20 0450 03/15/20 0600 03/16/20 0630 03/17/20 0533 03/18/20 0456  WBC 13.9* 7.0 8.6 12.3* 13.5*  HGB 12.4 10.3* 10.2* 10.8* 10.4*  HCT 37.3 30.2* 30.3* 32.2* 30.8*  MCV 80.2 80.5 81.0 80.3 79.4*  PLT 213 164 184 210 207   Basic Metabolic Panel: Recent Labs  Lab 03/13/20 0303 03/13/20 0303 03/14/20 0450 03/15/20 0600 03/16/20 0630 03/17/20 0533 03/18/20 0456  NA 132*   < > 127* 128* 127* 125* 127*  K 3.8   < >  4.0 3.9 3.8 3.8 4.1  CL 103   < > 98 95* 95* 92* 96*  CO2 19*   < > 21* 25 25 26 23   GLUCOSE 87   < > 136* 89 94 96 91  BUN 23   < > 27* 25* 15 10 12   CREATININE 0.92   < > 0.85 0.74 0.68 0.47 0.59  CALCIUM 7.8*   < > 8.7* 8.3* 8.2* 8.0* 7.9*  MG 2.1  --   --  2.1 1.9 1.8 1.7  PHOS 4.5  --   --   --   --   --   --    < > = values in this interval not displayed.   GFR: Estimated Creatinine Clearance: 53.8 mL/min (by C-G formula based on SCr of 0.59 mg/dL). Liver Function Tests: No results for input(s): AST, ALT, ALKPHOS, BILITOT, PROT, ALBUMIN in the last 168 hours. No results for input(s): LIPASE, AMYLASE in the last 168 hours. No results for input(s): AMMONIA in the last 168 hours. Coagulation Profile: No results for input(s): INR, PROTIME in the last 168 hours. Cardiac Enzymes: No results for input(s): CKTOTAL, CKMB, CKMBINDEX, TROPONINI in the last 168 hours. BNP (last 3 results) No results for input(s): PROBNP in the last 8760 hours. HbA1C: No results for input(s): HGBA1C in the last 72 hours. CBG: Recent Labs  Lab 03/13/20 0748  GLUCAP 85   Lipid Profile: No results for input(s): CHOL, HDL, LDLCALC, TRIG, CHOLHDL, LDLDIRECT in the last 72 hours. Thyroid Function  Tests: No results for input(s): TSH, T4TOTAL, FREET4, T3FREE, THYROIDAB in the last 72 hours. Anemia Panel: No results for input(s): VITAMINB12, FOLATE, FERRITIN, TIBC, IRON, RETICCTPCT in the last 72 hours. Sepsis Labs: Recent Labs  Lab 03/11/20 1957 03/12/20 0445  LATICACIDVEN 1.4 1.1    Recent Results (from the past 240 hour(s))  Respiratory Panel by RT PCR (Flu A&B, Covid) - Nasopharyngeal Swab     Status: None   Collection Time: 03/12/20  1:58 AM   Specimen: Nasopharyngeal Swab  Result Value Ref Range Status   SARS Coronavirus 2 by RT PCR NEGATIVE NEGATIVE Final    Comment: (NOTE) SARS-CoV-2 target nucleic acids are NOT DETECTED.  The SARS-CoV-2 RNA is generally detectable in upper respiratoy specimens during the acute phase of infection. The lowest concentration of SARS-CoV-2 viral copies this assay can detect is 131 copies/mL. A negative result does not preclude SARS-Cov-2 infection and should not be used as the sole basis for treatment or other patient management decisions. A negative result may occur with  improper specimen collection/handling, submission of specimen other than nasopharyngeal swab, presence of viral mutation(s) within the areas targeted by this assay, and inadequate number of viral copies (<131 copies/mL). A negative result must be combined with clinical observations, patient history, and epidemiological information. The expected result is Negative.  Fact Sheet for Patients:  05/12/20  Fact Sheet for Healthcare Providers:  05/12/20  This test is no t yet approved or cleared by the https://www.moore.com/ FDA and  has been authorized for detection and/or diagnosis of SARS-CoV-2 by FDA under an Emergency Use Authorization (EUA). This EUA will remain  in effect (meaning this test can be used) for the duration of the COVID-19 declaration under Section 564(b)(1) of the Act, 21 U.S.C. section  360bbb-3(b)(1), unless the authorization is terminated or revoked sooner.     Influenza A by PCR NEGATIVE NEGATIVE Final   Influenza B by PCR NEGATIVE NEGATIVE Final  Comment: (NOTE) The Xpert Xpress SARS-CoV-2/FLU/RSV assay is intended as an aid in  the diagnosis of influenza from Nasopharyngeal swab specimens and  should not be used as a sole basis for treatment. Nasal washings and  aspirates are unacceptable for Xpert Xpress SARS-CoV-2/FLU/RSV  testing.  Fact Sheet for Patients: https://www.moore.com/  Fact Sheet for Healthcare Providers: https://www.young.biz/  This test is not yet approved or cleared by the [place] FDA and  has been authorized for detection and/or diagnosis of SARS-CoV-2 by  FDA under an Emergency Use Authorization (EUA). This EUA will remain  in effect (meaning this test can be used) for the duration of the  Covid-19 declaration under Section 564(b)(1) of the Act, 21  U.S.C. section 360bbb-3(b)(1), unless the authorization is  terminated or revoked. Performed at [facility], [address] [place], [place] [address]       Imaging Studies   No results found.   Medications   Scheduled Meds: . amiodarone  200 mg Oral Daily  . apixaban  5 mg Oral BID  . Chlorhexidine Gluconate Cloth  6 each Topical Daily  . diltiazem  240 mg Oral Daily  . feeding supplement  237 mL Oral BID BM  . folic acid  1 mg Oral Daily  . guaiFENesin  600 mg Oral BID  . levothyroxine  75 mcg Oral QAC breakfast  . mouth rinse  15 mL Mouth Rinse BID  . metoprolol succinate  50 mg Oral Daily  . pantoprazole  40 mg Oral Daily  . polycarbophil  625 mg Oral TID  . potassium chloride  10 mEq Oral Daily  . sodium chloride flush  10-40 mL Intracatheter Q12H   Continuous Infusions:      LOS: 7 days     Darlin Priestly, md [facility]  03/18/2020, 5:11 PM

## 2020-03-19 ENCOUNTER — Inpatient Hospital Stay: Payer: Medicare Other

## 2020-03-19 DIAGNOSIS — K56609 Unspecified intestinal obstruction, unspecified as to partial versus complete obstruction: Secondary | ICD-10-CM | POA: Diagnosis not present

## 2020-03-19 DIAGNOSIS — E440 Moderate protein-calorie malnutrition: Secondary | ICD-10-CM | POA: Insufficient documentation

## 2020-03-19 LAB — MAGNESIUM: Magnesium: 1.6 mg/dL — ABNORMAL LOW (ref 1.7–2.4)

## 2020-03-19 LAB — PROCALCITONIN: Procalcitonin: 0.39 ng/mL

## 2020-03-19 LAB — CBC
HCT: 31.3 % — ABNORMAL LOW (ref 36.0–46.0)
Hemoglobin: 10.5 g/dL — ABNORMAL LOW (ref 12.0–15.0)
MCH: 27 pg (ref 26.0–34.0)
MCHC: 33.5 g/dL (ref 30.0–36.0)
MCV: 80.5 fL (ref 80.0–100.0)
Platelets: 240 10*3/uL (ref 150–400)
RBC: 3.89 MIL/uL (ref 3.87–5.11)
RDW: 16.8 % — ABNORMAL HIGH (ref 11.5–15.5)
WBC: 16.7 10*3/uL — ABNORMAL HIGH (ref 4.0–10.5)
nRBC: 0 % (ref 0.0–0.2)

## 2020-03-19 LAB — BASIC METABOLIC PANEL
Anion gap: 7 (ref 5–15)
BUN: 13 mg/dL (ref 8–23)
CO2: 25 mmol/L (ref 22–32)
Calcium: 8.1 mg/dL — ABNORMAL LOW (ref 8.9–10.3)
Chloride: 92 mmol/L — ABNORMAL LOW (ref 98–111)
Creatinine, Ser: 0.62 mg/dL (ref 0.44–1.00)
GFR, Estimated: >60 mL/min (ref >60)
Glucose, Bld: 102 mg/dL — ABNORMAL HIGH (ref 70–99)
Potassium: 4.1 mmol/L (ref 3.5–5.1)
Sodium: 124 mmol/L — ABNORMAL LOW (ref 135–145)

## 2020-03-19 MED ORDER — ADULT MULTIVITAMIN W/MINERALS CH
1.0000 | ORAL_TABLET | Freq: Every day | ORAL | Status: DC
  Administered 2020-03-20: 1 via ORAL
  Filled 2020-03-19: qty 1

## 2020-03-19 MED ORDER — SODIUM CHLORIDE 0.9 % IV SOLN
INTRAVENOUS | Status: DC | PRN
  Administered 2020-03-19: 250 mL via INTRAVENOUS

## 2020-03-19 MED ORDER — ENSURE MAX PROTEIN PO LIQD
11.0000 [oz_av] | Freq: Two times a day (BID) | ORAL | Status: DC
  Filled 2020-03-19: qty 330

## 2020-03-19 MED ORDER — MAGNESIUM SULFATE 2 GM/50ML IV SOLN
2.0000 g | Freq: Once | INTRAVENOUS | Status: AC
  Administered 2020-03-19: 2 g via INTRAVENOUS
  Filled 2020-03-19: qty 50

## 2020-03-19 MED ORDER — IOHEXOL 300 MG/ML  SOLN
100.0000 mL | Freq: Once | INTRAMUSCULAR | Status: AC | PRN
  Administered 2020-03-19: 100 mL via INTRAVENOUS

## 2020-03-19 MED ORDER — IOHEXOL 9 MG/ML PO SOLN
500.0000 mL | ORAL | Status: AC
  Administered 2020-03-19 (×2): 500 mL via ORAL

## 2020-03-19 NOTE — Care Management Important Message (Signed)
Important Message  Patient Details  Name: REGGIE WELGE MRN: [id number] Date of Birth: December 19, 1945   Medicare Important Message Given:  Yes     [name] 03/19/2020, 1:04 PM

## 2020-03-19 NOTE — Progress Notes (Signed)
Initial Nutrition Assessment  DOCUMENTATION CODES:   Non-severe (moderate) malnutrition in context of chronic illness  INTERVENTION:   Ensure Max protein supplement BID, each supplement provides 150kcal and 30g of protein.  MVI daily   NUTRITION DIAGNOSIS:   Moderate Malnutrition related to chronic illness (CHF, CVA) as evidenced by moderate fat depletion, moderate muscle depletion, severe muscle depletion.  GOAL:   Patient will meet greater than or equal to 90% of their needs  MONITOR:   PO intake, Supplement acceptance, Labs, Weight trends, Skin, I & O's  REASON FOR ASSESSMENT:   NPO/Clear Liquid Diet    ASSESSMENT:   74 y.o. female with h/o CHF, afib, CVA and diverticulosis who is s/p subtotal colectomy and end ileostomy 10/11 (with removal of 4.5cm terminal ileum) for complete colonic obstruction at the level of the sigmoid with marked cecal distension intra-operatively.   Met with pt in room today. Pt reports that she is hungry today and wants to eat; pt currently NPO for repeat CT scan. Pt documented to be eating 40-60% of meals in hospital. Pt is refusing Ensure supplements as she reports that they are too sweet. RD discussed with pt the importance of adequate nutrition needed to preserve lean muscle. Pt would like to try strawberry Ensure Max. RD will add supplements and MVI to help pt meet her estimated needs. Per chart, pt appears weight stable at baseline; pt reports her UBW is ~150lbs.   Medications reviewed and include: folic acid, synthroid, protonix, fibercon, KCl, Mg sulfate   Labs reviewed: Na 124(L), Mg 1.6(L) Wbc- 16.7(H), Hgb 10.5(L), Hct 31.3(L)  NUTRITION - FOCUSED PHYSICAL EXAM:    Most Recent Value  Orbital Region Severe depletion  Upper Arm Region Moderate depletion  Thoracic and Lumbar Region Moderate depletion  Buccal Region Moderate depletion  Temple Region Moderate depletion  Clavicle Bone Region Severe depletion  Clavicle and Acromion  Bone Region Severe depletion  Scapular Bone Region Moderate depletion  Dorsal Hand Severe depletion  Patellar Region Moderate depletion  Anterior Thigh Region Moderate depletion  Posterior Calf Region Severe depletion  Edema (RD Assessment) None  Hair Reviewed  Eyes Reviewed  Mouth Reviewed  Skin Reviewed  Nails Reviewed     Diet Order:   Diet Order            Diet NPO time specified  Diet effective now                EDUCATION NEEDS:   Education needs have been addressed  Skin:  Skin Assessment: Reviewed RN Assessment (incision abdomen)  Last BM:  10/18- 222m via ostomy  Height:   Ht Readings from Last 1 Encounters:  03/12/20 '5\' 5"'  (1.651 m)    Weight:   Wt Readings from Last 1 Encounters:  03/16/20 67.7 kg    Ideal Body Weight:  56.8 kg  BMI:  Body mass index is 24.84 kg/m.  Estimated Nutritional Needs:   Kcal:  1500-1700kcal/day  Protein:  75-85g/day  Fluid:  1.4L/day  CKoleen DistanceMS, RD, LDN Please refer to [facility] and/or RD on-call/weekend/after hours pager

## 2020-03-19 NOTE — Plan of Care (Signed)
Pt Axox4. Calm and cooperative and able to voice her needs. Pt colostomy bag draining large amount of loose brown Bms. Prn Tramadol administered twice for abdominal and lower back pain. Pt able to burp, but she states she is not passing gas through her rectum.  Pt using yanker to clear out occasional white oral secretions. On RA, safety measures in place. Will continue to monitor Problem: Education: Goal: Knowledge of General Education information will improve Description: Including pain rating scale, medication(s)/side effects and non-pharmacologic comfort measures Outcome: Progressing   Problem: Health Behavior/Discharge Planning: Goal: Ability to manage health-related needs will improve Outcome: Progressing   Problem: Clinical Measurements: Goal: Ability to maintain clinical measurements within normal limits will improve Outcome: Progressing Goal: Will remain free from infection Outcome: Progressing Goal: Diagnostic test results will improve Outcome: Progressing Goal: Respiratory complications will improve Outcome: Progressing Goal: Cardiovascular complication will be avoided Outcome: Progressing   Problem: Activity: Goal: Risk for activity intolerance will decrease Outcome: Progressing   Problem: Nutrition: Goal: Adequate nutrition will be maintained Outcome: Progressing   Problem: Coping: Goal: Level of anxiety will decrease Outcome: Progressing   Problem: Elimination: Goal: Will not experience complications related to bowel motility Outcome: Progressing Goal: Will not experience complications related to urinary retention Outcome: Progressing   Problem: Pain Managment: Goal: General experience of comfort will improve Outcome: Progressing   Problem: Safety: Goal: Ability to remain free from injury will improve Outcome: Progressing   Problem: Skin Integrity: Goal: Risk for impaired skin integrity will decrease Outcome: Progressing

## 2020-03-19 NOTE — Progress Notes (Signed)
Mobility Specialist - Progress Note   03/19/20 1609  Mobility  Activity Transferred to/from [facility]  Level of Assistance Contact guard assist, steadying assist  Assistive Device Front wheel walker  Distance Ambulated (ft) 10 ft  Mobility Response Tolerated well  Mobility performed by Mobility specialist  $Mobility charge 1 Mobility    Pt long-sitting in bed upon arrival. Pt agreed to session. Pt requested to go to [facility]. Pt able to get to EOB w/ SBA. Pt transferred to/from [facility] using a RW w/ CGA. No LOB noted. Pt had slow and steady gait during transfer. Pt did c/o "4/10" abdominal pain during session. Overall, pt tolerated session well. Pt left laying in bed w/ alarm set. Call bell and phone placed in reach. Nurse was notified.    Griselda Bramblett Mobility Specialist  03/19/20, 4:14 PM

## 2020-03-19 NOTE — Progress Notes (Addendum)
[facility] SURGICAL PROGRESS NOTE  Hospital Day(s): 8.   Post op day(s): 7 Days Post-Op.   Interval History:  Patient seen and examined no acute events or new complaints overnight.  Patient reports she is feeling better, her abdominal soreness continues to improve slowly No fever, chills, nausea, emesis. She continues to "spit up" frequently which has been unchanged post-operatively. Unsure if this is normal for her.  Her leukocytosis continues to trend upward, now up to 16.7K this morning UA on 10/17 without evidence of UTI Renal function remains normal, sCr - 0.62, UO - 400 ccs + unmeasured She continues to be hyponatremic to 124 and hypochloremic to 92 Mild hypomagnesemia to 1.6 On regular diet Ileostomy with 1.5L out in last 24 hours Working with therapies; recommending SNF  Vital signs in last 24 hours: [min-max] current  Temp:  [97.4 F (36.3 C)-97.9 F (36.6 C)] 97.6 F (36.4 C) (10/18 0726) Pulse Rate:  [71-82] 71 (10/18 0726) Resp:  [16-20] 16 (10/18 0726) BP: (94-106)/(57-73) 106/72 (10/18 0726) SpO2:  [97 %-99 %] 98 % (10/18 0726)     Height: 5\' 5"  (165.1 cm) Weight: 67.7 kg BMI (Calculated): 24.84   Intake/Output last 2 shifts:  10/17 0701 - 10/18 0700 In: 596 [P.O.:596] Out: 1975 [Urine:400; Stool:1575]   Physical Exam:  Constitutional: alert, cooperative and no distress  Respiratory: breathing non-labored at rest, on [place] Gastrointestinal:Soft, diffuse soreness, non-distended, no rebound/guarding. Ileostomy in right abdomen, this is dusky, there iscopiousliquid stool in bag Integumentary:Laparotomy incisionis intact with staples and honeycomb, there is some drainage on the inferior portion of that.  Labs:  CBC Latest Ref Rng & Units 03/19/2020 03/18/2020 03/17/2020  WBC 4.0 - 10.5 K/uL 16.7(H) 13.5(H) 12.3(H)  Hemoglobin 12.0 - 15.0 g/dL 10.5(L) 10.4(L) 10.8(L)  Hematocrit 36 - 46 % 31.3(L) 30.8(L) 32.2(L)  Platelets 150 - 400 K/uL  240 207 210   CMP Latest Ref Rng & Units 03/19/2020 03/18/2020 03/17/2020  Glucose 70 - 99 mg/dL 03/19/2020) 91 96  BUN 8 - 23 mg/dL 13 12 10   Creatinine 0.44 - 1.00 mg/dL 376(E 8.31  Sodium 135 - 145 mmol/L 124(L) 127(L) 125(L)  Potassium 3.5 - 5.1 mmol/L 4.1 4.1 3.8  Chloride 98 - 111 mmol/L 92(L) 96(L) 92(L)  CO2 22 - 32 mmol/L 25 23 26   Calcium 8.9 - 10.3 mg/dL 8.1(L) 7.9(L) 8.0(L)  Total Protein 6.5 - 8.1 g/dL - - -  Total Bilirubin 0.3 - 1.2 mg/dL - - -  Alkaline Phos 38 - 126 U/L - - -  AST 15 - 41 U/L - - -  ALT 0 - 44 U/L - - -     Imaging studies: Report pending on abd/pelvic CT, but appears a right sided pleural effusion, and some atelectasis, may warrant CXR, to r/o pna.    Assessment/Plan:  74 y.o. female with worsening leukocytosis 7 Days Post-Op s/p subtotal colectomy and end ileostomyfor complete colonic obstruction at the level of the sigmoid with marked cecal distension intra-operatively, complicated by pertinent comorbidities includingAfib, CHF..   - Given progressive increase in leukocytosis, we will repeat CT Abdomen/Pelvis to ensure no intra-abdominal process (ex: abscess) present to explain leukocytosis   - NPO for now; if no issues on CT we can resume diet             - Monitor abdominal examination - Monitor on-going ileostomy function closely; once ileostomy function consistent she will most likely need anti-diarrheals - Pain control prn; antiemetics prn -  WOC RN for ostomy teaching assistance - Monitor leukocytosis, renal function, electrolytes - Monitor H&H; suspect dilutional anemia at the moment -Appreciate cardiology assistance - Mobilization as feasible;PT following; recommending SNF - Further management per primary service; we will follow   - f/u CXR, consider Abx.   All of the above findings and recommendations were discussed with the patient,  and the medical team, and all of patient's questions were answered to her expressed satisfaction.  -- Lynden Oxford, PA-C [facility] 03/19/2020, 7:45 AM [phone] M-F: 7am - 4pm \  Campbell Lerner, M.D., [facility]  03/19/2020 ; 12:05 PM

## 2020-03-19 NOTE — TOC Progression Note (Signed)
Transition of Care [facility]) - Progression Note    Patient Details  Name: [name] MRN: [id number] Date of Birth: 1946-01-31  Transition of Care [facility]) CM/SW Contact  Margarito Liner, LCSW Phone Number: 03/19/2020, 10:24 AM  Clinical Narrative: Made [facility] admissions coordinator aware of ileostomy. They have the needed supplies.    Expected Discharge Plan: Skilled Nursing Facility Barriers to Discharge: Continued Medical Work up  Expected Discharge Plan and Services Expected Discharge Plan: Skilled Nursing Facility In-house Referral: NA   Post Acute Care Choice: Skilled Nursing Facility Living arrangements for the past 2 months: Single Family Home                                       Social Determinants of Health (SDOH) Interventions    Readmission Risk Interventions No flowsheet data found.

## 2020-03-19 NOTE — Progress Notes (Signed)
PROGRESS NOTE    [name]   INO:[id number]  DOB: April 09, 1946  PCP: Mickel Fuchs, MD    DOA: 03/11/2020 LOS: 8   Brief Narrative   [name] is a 74 y.o. female with medical history significant for chronic dCHF, A-fib on Eliquis, history of CVA, hypothyroidism, iron deficiency anemia, history of diverticulitis who presented to the ED on 03/11/20 with nausea, vomiting and abdominal pain and worsening abdominal distention, and no bowel movement in 5 days.  Onset was in the setting of a spicy meal she'd eaten four days prior and subsequently had few days of nausea/vomiting.    She recently had diverticulitis and underwent a colonoscopy in August due to concerns of possible sigmoid obstruction.  Colonoscopy showed only significant sigmoid edema at that time.  In the ED, afebrile, tachycardic and tachypneic.  Labs significant for leukocytosis of 13.3, sodium 126.  CT abdomen pelvis show moderate diffuse colonic distention with a degree of obstruction at the sigmoid colon possibly secondary to muscular hypertrophy versus stricture.  No active diverticulitis.  General surgery consulted.  On 10/11, patient had Gastrografin enema which showed complete colonic obstruction at the level of the mid to distal sigmoid colon.  Patient was taken to surgery, underwent subtotal colectomy.     Assessment & Plan   Principal Problem:   Large bowel obstruction [facility]) Active Problems:   Atrial fibrillation (HCC)   Hypothyroidism   Abdominal distention   Hyponatremia   Chronic diastolic heart failure (HCC)   Malnutrition of moderate degree   Leukocytosis --WBC trending up. --UA 10/17, neg for infection --CT a/p today, no infectious finding. --Monitor for now.  Large Bowel Obstruction - POA s/p subtotal colectomy with end ileostomy --with 5 days constipation, diffuse abdominal pain, worsening distention and obstruction confirmed by Gastrografin enema on 10/11.  Underwent subtotal  colectomy on 10/11 with Dr. Toula Moos  --started having large liquid output via ostomy PLAN: --cont regular diet --Hold MIVF and encourage oral hydration --watch out for excessive diarrhea --PRN antiemetics and pain control  --cont fibercon for stool bulking  Hypotension - resolved.  Post-operatively and overnight 10/11-12 had low BP's but maintained MAP's.   Suspect due to anesthesia medications and hypovolemia.  Responded to fluid bolus in PACU.  Hyponatremia -  secondary to hypovolemia from decrease PO intake.  Improving with IV fluids, but trended down again after IVF d/c'ed. --urine studies neg for SIADH --encourage oral hydration and nutrition  Hypomag --replete with IV mag  A fib with RVR in post-op setting - expect rate to improve with hydration and improved BP, resumption of PO medications.   --Cardiology consulted, Dr. Juliann Pares --Transfer to PCU for Amiodarone gtt for rate control given soft BP's, also received digoxin (since d/c'ed) --currently rate controlled PLAN: --cont amiodarone 200 mg daily --cont cardizem 250 mg daily and Toprol 50 mg daily --cont current regimen --continue Eliquis --outpatient f/u with cardiology 1-2 weeks after discharge  Chronic diastolic heart failure - compensated on admission, euvolemic.   --cont to hold lasix due to low BP   Hypothyroidism -  --cont home Synthyroid    Diet:  Diet Orders (From admission, onward)    Start     Ordered   03/19/20 1505  Diet regular Room service appropriate? Yes; Fluid consistency: Thin  Diet effective now       Question Answer Comment  Room service appropriate? Yes   Fluid consistency: Thin      03/19/20 1504  Code Status: DNR    Subjective 03/19/20    Pt again complained of spitting up.  Did not like Ensure.  Eating well.    CT a/p today without infectious finding.   Disposition Plan & Communication   DVT prophylaxis: [id number] Code Status: DNR  Family Communication:   Status is: inpatient Dispo:   The patient is from: home Anticipated d/c is to: SNF Anticipated d/c date is: 2-3 days Patient currently is not medically stable to d/c due to: waiting on ostomy output to stabilize, workup for leukocytosis.     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  Antimicrobials:  Anti-infectives (From admission, onward)   Start     Dose/Rate Route Frequency Ordered Stop   03/12/20 2200  cefoTEtan (CEFOTAN) 2 g in sodium chloride 0.9 % 100 mL IVPB        2 g 200 mL/hr over 30 Minutes Intravenous Every 12 hours 03/12/20 1607 03/12/20 2244   03/12/20 1200  cefoTEtan (CEFOTAN) 2 g in sodium chloride 0.9 % 100 mL IVPB        2 g 200 mL/hr over 30 Minutes Intravenous  Once 03/12/20 1131 03/12/20 1300   03/12/20 1130  sodium chloride 0.9 % with cefoTEtan (CEFOTAN) ADS Med       Note to Pharmacy: Mikey Bussing   : cabinet override      03/12/20 1130 03/12/20 1200   03/12/20 0945  piperacillin-tazobactam (ZOSYN) IVPB 3.375 g  Status:  Discontinued        3.375 g 12.5 mL/hr over 240 Minutes Intravenous Every 8 hours 03/12/20 0940 03/12/20 1607        Objective   Vitals:   03/19/20 1351 03/19/20 1459 03/19/20 1514 03/19/20 1936  BP: (!) 94/57  (!) 113/57 (!) 97/58  Pulse: 86  80 89  Resp: Temp: (!) 97.5 F (36.4 C)  (!) 97.5 F (36.4 C) 97.9 F (36.6 C)  TempSrc: Oral  Oral Oral  SpO2: 100%  99% 98%  Weight:  70.8 kg    Height:        Intake/Output Summary (Last 24 hours) at 03/19/2020 2114 Last data filed at 03/19/2020 2026 Gross per 24 hour  Intake 120 ml  Output 2575 ml  Net -2455 ml   Filed Weights   03/15/20 0554 03/16/20 0339 03/19/20 1459  Weight: 68.3 kg 67.7 kg 70.8 kg    Physical Exam:  Constitutional: NAD, AAOx3 HEENT: conjunctivae and lids normal, EOMI CV: RRR 3+ systolic murmur. Distal pulses +2.  No cyanosis.   RESP: CTA B/L, normal  respiratory effort  GI: +BS, starting to have pasty stool output Extremities: No effusions, edema in BLE SKIN: warm, dry and intact Neuro: II - XII grossly intact.  Sensation intact Psych: Normal mood and affect.      Labs   Data Reviewed: I have personally reviewed following labs and imaging studies  CBC: Recent Labs  Lab 03/15/20 0600 03/16/20 0630 03/17/20 0533 03/18/20 0456 03/19/20 0325  WBC 7.0 8.6 12.3* 13.5* 16.7*  HGB 10.3* 10.2* 10.8* 10.4* 10.5*  HCT 30.2* 30.3* 32.2* 30.8* 31.3*  MCV 80.5 81.0 80.3 79.4* 80.5  PLT 164 184 210 207 240   Basic Metabolic Panel: Recent Labs  Lab 03/13/20 0303 03/14/20 0450 03/15/20 0600 03/16/20 0630 03/17/20 0533 03/18/20 0456 03/19/20 0325  NA 132*   < > 128* 127* 125* 127* 124*  K  3.8   < > 3.9 3.8 3.8 4.1 4.1  CL 103   < > 95* 95* 92* 96* 92*  CO2 19*   < > 25 25 26 23 25   GLUCOSE 87   < > 89 94 96 91 102*  BUN 23   < > 25* 15 10 12 13   CREATININE 0.92   < > 0.74 0.68 0.47 0.59 0.62  CALCIUM 7.8*   < > 8.3* 8.2* 8.0* 7.9* 8.1*  MG 2.1  --  2.1 1.9 1.8 1.7 1.6*  PHOS 4.5  --   --   --   --   --   --    < > = values in this interval not displayed.   GFR: Estimated Creatinine Clearance: 59 mL/min (by C-G formula based on SCr of 0.62 mg/dL). Liver Function Tests: No results for input(s): AST, ALT, ALKPHOS, BILITOT, PROT, ALBUMIN in the last 168 hours. No results for input(s): LIPASE, AMYLASE in the last 168 hours. No results for input(s): AMMONIA in the last 168 hours. Coagulation Profile: No results for input(s): INR, PROTIME in the last 168 hours. Cardiac Enzymes: No results for input(s): CKTOTAL, CKMB, CKMBINDEX, TROPONINI in the last 168 hours. BNP (last 3 results) No results for input(s): PROBNP in the last 8760 hours. HbA1C: No results for input(s): HGBA1C in the last 72 hours. CBG: Recent Labs  Lab 03/13/20 0748  GLUCAP 85   Lipid Profile: No results for input(s): CHOL, HDL, LDLCALC, TRIG, CHOLHDL,  LDLDIRECT in the last 72 hours. Thyroid Function Tests: No results for input(s): TSH, T4TOTAL, FREET4, T3FREE, THYROIDAB in the last 72 hours. Anemia Panel: No results for input(s): VITAMINB12, FOLATE, FERRITIN, TIBC, IRON, RETICCTPCT in the last 72 hours. Sepsis Labs: Recent Labs  Lab 03/19/20 0325  PROCALCITON 0.39    Recent Results (from the past 240 hour(s))  Respiratory Panel by RT PCR (Flu A&B, Covid) - Nasopharyngeal Swab     Status: None   Collection Time: 03/12/20  1:58 AM   Specimen: Nasopharyngeal Swab  Result Value Ref Range Status   SARS Coronavirus 2 by RT PCR NEGATIVE NEGATIVE Final    Comment: (NOTE) SARS-CoV-2 target nucleic acids are NOT DETECTED.  The SARS-CoV-2 RNA is generally detectable in upper respiratoy specimens during the acute phase of infection. The lowest concentration of SARS-CoV-2 viral copies this assay can detect is 131 copies/mL. A negative result does not preclude SARS-Cov-2 infection and should not be used as the sole basis for treatment or other patient management decisions. A negative result may occur with  improper specimen collection/handling, submission of specimen other than nasopharyngeal swab, presence of viral mutation(s) within the areas targeted by this assay, and inadequate number of viral copies (<131 copies/mL). A negative result must be combined with clinical observations, patient history, and epidemiological information. The expected result is Negative.  Fact Sheet for Patients:  https://www.moore.com/https://www.fda.gov/media/142436/download  Fact Sheet for Healthcare Providers:  https://www.young.biz/https://www.fda.gov/media/142435/download  This test is no t yet approved or cleared by the [place] FDA and  has been authorized for detection and/or diagnosis of SARS-CoV-2 by FDA under an Emergency Use Authorization (EUA). This EUA will remain  in effect (meaning this test can be used) for the duration of the COVID-19 declaration under Section 564(b)(1) of  the Act, 21 U.S.C. section 360bbb-3(b)(1), unless the authorization is terminated or revoked sooner.     Influenza A by PCR NEGATIVE NEGATIVE Final   Influenza B by PCR NEGATIVE NEGATIVE Final  Comment: (NOTE) The Xpert Xpress SARS-CoV-2/FLU/RSV assay is intended as an aid in  the diagnosis of influenza from Nasopharyngeal swab specimens and  should not be used as a sole basis for treatment. Nasal washings and  aspirates are unacceptable for Xpert Xpress SARS-CoV-2/FLU/RSV  testing.  Fact Sheet for Patients: https://www.moore.com/  Fact Sheet for Healthcare Providers: https://www.young.biz/  This test is not yet approved or cleared by the [place] FDA and  has been authorized for detection and/or diagnosis of SARS-CoV-2 by  FDA under an Emergency Use Authorization (EUA). This EUA will remain  in effect (meaning this test can be used) for the duration of the  Covid-19 declaration under Section 564(b)(1) of the Act, 21  U.S.C. section 360bbb-3(b)(1), unless the authorization is  terminated or revoked. Performed at [facility], [address] [place], [place] [address]       Imaging Studies   DG Chest 2 View  Result Date: 03/19/2020 CLINICAL DATA:  Pneumonia. EXAM: CHEST - 2 VIEW COMPARISON:  March 13, 2020. FINDINGS: Stable cardiomegaly. No pneumothorax is noted. Right-sided PICC line is unchanged in position. Mild bilateral pleural effusions are noted, right greater than left. Probable bibasilar atelectasis is noted as well. Bony thorax is unremarkable. IMPRESSION: Mild bilateral pleural effusions, right greater than left. Probable bibasilar atelectasis. Electronically Signed   By: Lupita Raider M.D.   On: 03/19/2020 13:12   CT ABDOMEN PELVIS W CONTRAST  Result Date: 03/19/2020 CLINICAL DATA:  Worsening leukocytosis 1 week following subtotal colectomy. Abdominal abscess/infection suspected. EXAM: CT ABDOMEN AND  PELVIS WITH CONTRAST TECHNIQUE: Multidetector CT imaging of the abdomen and pelvis was performed using the standard protocol following bolus administration of intravenous contrast. CONTRAST:  OMNIPAQUE IOHEXOL 300 MG/ML  SOLN COMPARISON:  Abdominopelvic CT 03/11/2020 and 12/21/2019. FINDINGS: Lower chest: Slight interval enlargement of moderate right and small left pleural effusions with associated worsening atelectasis dependently in both lung bases. Stable cardiomegaly and coronary artery atherosclerosis. No significant pericardial fluid. Hepatobiliary: Stable appendix cysts. Stable mild extrahepatic biliary dilatation post cholecystectomy. Pancreas: Unremarkable. No pancreatic ductal dilatation or surrounding inflammatory changes. Spleen: Normal in size without focal abnormality. Adrenals/Urinary Tract: Both adrenal glands appear normal. Stable mild bilateral renal cortical thinning. No renal mass, urinary tract calculus or hydronephrosis. The bladder appears normal. Stomach/Bowel: Enteric contrast was administered and extends to the ileostomy in the right mid abdominal wall. There is mild diffuse dilatation of the stomach and small bowel without focal wall thickening or enteric contrast extravasation. Subtotal colectomy has been performed without evidence of suture breakdown at the [facility]. Vascular/Lymphatic: There are no enlarged abdominal or pelvic lymph nodes. Aortic and branch vessel atherosclerosis. The portal, superior mesenteric and splenic veins are patent. Reproductive: Enhancing mass anteriorly in the lower uterine segment measuring 4.4 x 3.1 cm on image 81/2, grossly stable from prior CTs and consistent with an anterior fibroid. No suspicious adnexal findings. Other: Small amount of pelvic and interloop ascites without suspicious peritoneal enhancement or focal extraluminal fluid collection. No free intraperitoneal air. Musculoskeletal: No acute or significant osseous findings.  IMPRESSION: 1. Interval subtotal colectomy and ileostomy. Mild diffuse dilatation of the stomach and small bowel without focal wall thickening or enteric contrast extravasation, suggesting adynamic ileus. 2. Small amount of pelvic and interloop ascites without suspicious peritoneal enhancement or focal extraluminal fluid collection. 3. Slight interval enlargement of right greater than left pleural effusions with associated worsening atelectasis dependently in both lung bases. 4. Uterine fibroid. 5. Aortic Atherosclerosis (ICD10-I70.0).  Electronically Signed   By: Carey Bullocks M.D.   On: 03/19/2020 12:20     Medications   Scheduled Meds: . amiodarone  200 mg Oral Daily  . apixaban  5 mg Oral BID  . Chlorhexidine Gluconate Cloth  6 each Topical Daily  . diltiazem  240 mg Oral Daily  . folic acid  1 mg Oral Daily  . guaiFENesin  600 mg Oral BID  . levothyroxine  75 mcg Oral QAC breakfast  . mouth rinse  15 mL Mouth Rinse BID  . metoprolol succinate  50 mg Oral Daily  . [START ON 03/20/2020] multivitamin with minerals  1 tablet Oral Daily  . pantoprazole  40 mg Oral Daily  . polycarbophil  625 mg Oral TID  . potassium chloride  10 mEq Oral Daily  . [START ON 03/20/2020] Ensure Max Protein  11 oz Oral BID  . sodium chloride flush  10-40 mL Intracatheter Q12H   Continuous Infusions: . sodium chloride 250 mL (03/19/20 1656)       LOS: 8 days     Darlin Priestly, md [facility]  03/19/2020, 9:14 PM

## 2020-03-19 NOTE — Consult Note (Addendum)
WOC Nurse ostomy follow up Stoma type/location: RUQ ileostomy Stomal assessment/size: Slightly larger than 1 and 1/4 inches, lumen in center Peristomal assessment: intact, ecchymotic in places Treatment options for stomal/peristomal skin: skin barrier ring Output: high volume liquid ruddy brown effluent.  Patient has just consumed contrast for CT scan which is imminent. Ostomy pouching: 2pc. 2 and 3/4 inch pouching system with skin barrier ring Education provided: Demonstration of pouch change. Patient is able to affirm that she recalls the steps from last Thursday's pouch change with my associate and reports that the pouch has really been filling fast lately.  She mentioned it to her MD and reports that he said it could be related to the fact that she is eating more. She was surprised to learn that today was the day for a pouching system change and is able to reiterate that we change her pouches twice weekly.  I put both today's date and the change date on this pouch. Enrolled patient in [place] Secure Start Discharge program: Yes, previously  Next pouch change is due on Thursday, 10/22, if still in house.  Supplies taken to bedside:  3 skin barriers, 3 pouches, 3 skin barrier rings 1 sizing guide.  The educational booklet is still at bedside.  WOC nursing team will follow, and will remain available to this patient, the nursing and medical teams.   Thanks, Ladona Mow, MSN, RN, GNP, [place]  Pager# [phone]

## 2020-03-20 DIAGNOSIS — K56609 Unspecified intestinal obstruction, unspecified as to partial versus complete obstruction: Secondary | ICD-10-CM | POA: Diagnosis not present

## 2020-03-20 LAB — RESPIRATORY PANEL BY RT PCR (FLU A&B, COVID)
Influenza A by PCR: NEGATIVE
Influenza B by PCR: NEGATIVE
SARS Coronavirus 2 by RT PCR: NEGATIVE

## 2020-03-20 LAB — BASIC METABOLIC PANEL
Anion gap: 9 (ref 5–15)
BUN: 9 mg/dL (ref 8–23)
CO2: 23 mmol/L (ref 22–32)
Calcium: 8.2 mg/dL — ABNORMAL LOW (ref 8.9–10.3)
Chloride: 97 mmol/L — ABNORMAL LOW (ref 98–111)
Creatinine, Ser: 0.6 mg/dL (ref 0.44–1.00)
GFR, Estimated: >60 mL/min (ref >60)
Glucose, Bld: 96 mg/dL (ref 70–99)
Potassium: 3.9 mmol/L (ref 3.5–5.1)
Sodium: 129 mmol/L — ABNORMAL LOW (ref 135–145)

## 2020-03-20 LAB — CBC
HCT: 31.8 % — ABNORMAL LOW (ref 36.0–46.0)
Hemoglobin: 10.6 g/dL — ABNORMAL LOW (ref 12.0–15.0)
MCH: 26.6 pg (ref 26.0–34.0)
MCHC: 33.3 g/dL (ref 30.0–36.0)
MCV: 79.7 fL — ABNORMAL LOW (ref 80.0–100.0)
Platelets: 248 10*3/uL (ref 150–400)
RBC: 3.99 MIL/uL (ref 3.87–5.11)
RDW: 16.8 % — ABNORMAL HIGH (ref 11.5–15.5)
WBC: 14.4 10*3/uL — ABNORMAL HIGH (ref 4.0–10.5)
nRBC: 0 % (ref 0.0–0.2)

## 2020-03-20 LAB — MAGNESIUM: Magnesium: 2.1 mg/dL (ref 1.7–2.4)

## 2020-03-20 MED ORDER — CALCIUM POLYCARBOPHIL 625 MG PO TABS: 625.0000 mg | ORAL_TABLET | Freq: Three times a day (TID) | ORAL | Status: DC

## 2020-03-20 MED ORDER — GUAIFENESIN ER 600 MG PO TB12
600.0000 mg | ORAL_TABLET | Freq: Two times a day (BID) | ORAL | Status: DC
Start: 2020-03-20 — End: 2020-04-03

## 2020-03-20 MED ORDER — FUROSEMIDE 20 MG PO TABS
ORAL_TABLET | ORAL | Status: DC
Start: 2020-03-20 — End: 2020-04-03

## 2020-03-20 MED ORDER — AMIODARONE HCL 200 MG PO TABS: 200.0000 mg | ORAL_TABLET | Freq: Every day | ORAL | Status: AC

## 2020-03-20 MED ORDER — ADULT MULTIVITAMIN W/MINERALS CH: 1.0000 | ORAL_TABLET | Freq: Every day | ORAL | Status: AC

## 2020-03-20 MED ORDER — METOPROLOL SUCCINATE ER 25 MG PO TB24
50.0000 mg | ORAL_TABLET | Freq: Every day | ORAL | Status: DC
Start: 2020-03-20 — End: 2020-04-03

## 2020-03-20 MED ORDER — ENSURE MAX PROTEIN PO LIQD: 11.0000 [oz_av] | Freq: Two times a day (BID) | ORAL | Status: AC

## 2020-03-20 NOTE — Progress Notes (Signed)
Physical Therapy Treatment Patient Details Name: [name] MRN: [id number] DOB: [birth year] Today's Date: 03/20/2020    History of Present Illness Pt is a 74yo F admitted to [facility] on 10/10 for abdominal distention, nausea, and vomiting secondary to a large bowel obstruction. Pt with significant PMH including: Afib, CHF, cholecystectomy with recent colonoscopy, hx CVA, hypothyroidism. 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 Claudine Mouton. Pt transported to cardiac telemetry floor secondary to Afib with RVR.    PT Comments    Patient assisted with repositioning posterior hips in bed after independently long sitting for better positioning for upright eating. Patient declined getting OOB to chair today but agreeable to in bed exercises for LE.  Patient participated with BLE strengthening exercises with A.AROM- AROM as needed with verbal and tactile cues for technique. No shortness of breath is noted with exercises. Recommendation for SNF remains appropriate for discharge. Recommend to continue PT to maximize independence, return to PLOF, and address remaining functional limitations.   Follow Up Recommendations        Equipment Recommendations   (to be determined at next level of care )    Recommendations for Other Services       Precautions / Restrictions Precautions Precautions: Fall Restrictions Weight Bearing Restrictions: No    Mobility  Bed Mobility Overal bed mobility: Needs Assistance             General bed mobility comments: SBA for long sitting in bed and Mod A required for posterior hip scooting with cues for technique for repositioning to be more upright in bed for proper eating positioning. Patient refused getting OOB to chair today due to wanting to eat lunch. patient agreeable to in bed exercises   Transfers                    Ambulation/Gait                 Stairs             Wheelchair Mobility    Modified Rankin  (Stroke Patients Only)       Balance                                            Cognition Arousal/Alertness: Awake/alert Behavior During Therapy: WFL for tasks assessed/performed Overall Cognitive Status: Within Functional Limits for tasks assessed                                        Exercises General Exercises - Lower Extremity Ankle Circles/Pumps: AROM;Strengthening;Both;10 reps;Supine Heel Slides: AAROM;Strengthening;Both;10 reps;Supine Hip ABduction/ADduction: AAROM;Strengthening;Both;10 reps;Supine Straight Leg Raises: AAROM;Strengthening;Both;10 reps;Supine Other Exercises Other Exercises: exercies performed with tactile and verbal cues for technique for general strengthening of LE. no shortness of breath reported with exercises     General Comments        Pertinent Vitals/Pain Pain Assessment: No/denies pain    Home Living                      Prior Function            PT Goals (current goals can now be found in the care plan section) Acute Rehab PT Goals Patient Stated Goal: to go home PT Goal  Formulation: With patient Time For Goal Achievement: 03/28/20 Potential to Achieve Goals: Fair Progress towards PT goals: Progressing toward goals    Frequency    Min 2X/week      PT Plan Current plan remains appropriate    Co-evaluation              AM-PAC PT "6 Clicks" Mobility   Outcome Measure  Help needed turning from your back to your side while in a flat bed without using bedrails?: A Little Help needed moving from lying on your back to sitting on the side of a flat bed without using bedrails?: A Lot Help needed moving to and from a bed to a chair (including a wheelchair)?: A Little Help needed standing up from a chair using your arms (e.g., wheelchair or bedside chair)?: A Lot Help needed to walk in hospital room?: A Little Help needed climbing 3-5 steps with a railing? : A Lot 6 Click Score:  15    End of Session   Activity Tolerance:  (self limiting by willingness to advance OOB activity ) Patient left: in bed;with call bell/phone within reach;with bed alarm set Nurse Communication:  (white board up to date with mobility status ) PT Visit Diagnosis: Unsteadiness on feet (R26.81);Muscle weakness (generalized) (M62.81)     Time: 1610-9604 PT Time Calculation (min) (ACUTE ONLY): 14 min  Charges:  $Therapeutic Exercise: 8-22 mins                     Donna Bernard, PT, MPT    [name] 03/20/2020, 1:49 PM

## 2020-03-20 NOTE — Progress Notes (Signed)
Mobility Specialist - Progress Note   03/20/20 1428  Mobility  Activity Refused mobility  Mobility performed by Mobility specialist    Pt politely declined session at this time. States she wants to "save energy" for d/c.     Priyanka Causey Mobility Specialist  03/20/20, 2:29 PM

## 2020-03-20 NOTE — TOC Transition Note (Signed)
Transition of Care [facility]) - CM/SW Discharge Note   Patient Details  Name: UNNAMED HINO MRN: [id number] Date of Birth: 11-15-1945  Transition of Care [facility]) CM/SW Contact:  Margarito Liner, LCSW Phone Number: 03/20/2020, 3:04 PM   Clinical Narrative: Patient has orders to discharge to [facility] today. RN will call report to [phone]. EMS transport arranged for 3:30 pm. No further concerns. CSW signing off.    Final next level of care: Skilled Nursing Facility Barriers to Discharge: Barriers Resolved   Patient Goals and CMS Choice Patient states their goals for this hospitalization and ongoing recovery are:: to get better   Choice offered to / list presented to : Patient, Adult Children  Discharge Placement PASRR number recieved: 03/15/20            Patient chooses bed at: [facility] Patient to be transferred to facility by: EMS Name of family member notified: Jacari Kirsten (left him a voicemail once transport was arranged) Patient and family notified of of transfer: 03/20/20  Discharge Plan and Services In-house Referral: NA   Post Acute Care Choice: Skilled Nursing Facility                               Social Determinants of Health (SDOH) Interventions     Readmission Risk Interventions No flowsheet data found.

## 2020-03-20 NOTE — TOC Progression Note (Signed)
Transition of Care [facility]) - Progression Note    Patient Details  Name: [name] MRN: [id number] Date of Birth: [birth year]  [facility] CM/SW Contact  Margarito Liner, LCSW Phone Number: 03/20/2020, 10:50 AM  Clinical Narrative:  Patient will discharge to [facility] today once rapid COVID test results are in. Son [name] is aware. Tried to notify patient x 2 but she was on the phone then getting patient care.   Expected Discharge Plan: Skilled Nursing Facility Barriers to Discharge: Continued Medical Work up  Expected Discharge Plan and Services Expected Discharge Plan: Skilled Nursing Facility In-house Referral: NA   Post Acute Care Choice: Skilled Nursing Facility Living arrangements for the past 2 months: Single Family Home Expected Discharge Date: 03/20/20                                     Social Determinants of Health (SDOH) Interventions    Readmission Risk Interventions No flowsheet data found.

## 2020-03-20 NOTE — Progress Notes (Signed)
[facility] SURGICAL PROGRESS NOTE  Hospital Day(s): 9.   Post op day(s): 8 Days Post-Op.   Interval History:  Patient seen and examined no acute events or new complaints overnight.  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 in last 24 hours Working with therapies; recommending SNF  Vital signs in last 24 hours: [min-max] current  Temp:  [97.5 F (36.4 C)-97.9 F (36.6 C)] 97.7 F (36.5 C) (10/19 0416) Pulse Rate:  [66-94] 66 (10/19 0416) Resp:  [15-20] 20 (10/19 0416) BP: (94-113)/(57-72) 105/67 (10/19 0416) SpO2:  [98 %-100 %] 99 % (10/19 0416) Weight:  [70.8 kg] 70.8 kg (10/18 1459)     Height: 5\' 5"  (165.1 cm) Weight: 70.8 kg BMI (Calculated): 25.98   Intake/Output last 2 shifts:  10/18 0701 - 10/19 0700 In: 0  Out: 2925 [Urine:1700; Stool:1225]   Physical Exam:  Constitutional: alert, cooperative and no distress  Respiratory: breathing non-labored at rest, on [place] Gastrointestinal:Soft, diffuse soreness, non-distended, no rebound/guarding. Ileostomy in right abdomen, this is dusky, there iscopiousliquid stool and gas in bag Integumentary:Laparotomy incisionis intact with staples and honeycomb, there is some drainage on the inferior portion of that.  Labs:  CBC Latest Ref Rng & Units 03/20/2020 03/19/2020 03/18/2020  WBC 4.0 - 10.5 K/uL 14.4(H) 16.7(H) 13.5(H)  Hemoglobin 12.0 - 15.0 g/dL 10.6(L) 10.5(L) 10.4(L)  Hematocrit 36 - 46 % 31.8(L) 31.3(L) 30.8(L)  Platelets 150 - 400 K/uL 248 240 207   CMP Latest Ref Rng & Units 03/20/2020 03/19/2020 03/18/2020  Glucose 70 -  99 mg/dL 96 03/20/2020) 91  BUN 8 - 23 mg/dL 9 13 12   Creatinine 0.44 - 1.00 mg/dL 161(W 9.60  Sodium 135 - 145 mmol/L 129(L) 124(L) 127(L)  Potassium 3.5 - 5.1 mmol/L 3.9 4.1 4.1  Chloride 98 - 111 mmol/L 97(L) 92(L) 96(L)  CO2 22 - 32 mmol/L 23 25 23   Calcium 8.9 - 10.3 mg/dL 8.2(L) 8.1(L) 7.9(L)  Total Protein 6.5 - 8.1 g/dL - - -  Total Bilirubin 0.3 - 1.2 mg/dL - - -  Alkaline Phos 38 - 126 U/L - - -  AST 15 - 41 U/L - - -  ALT 0 - 44 U/L - - -     Imaging studies: No new pertinent imaging studies   Assessment/Plan:  74 y.o. 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 with marked cecal distension intra-operatively, complicated by pertinent comorbidities includingAfib, CHF   - Okay to continue regular diet + nutritional supplementation prn  - Monitor abdominal examination - Monitor on-going ileostomy function closely; once ileostomy function consistent she will most likely need anti-diarrheals vs bulking agents   - Pain control prn; antiemetics prn - WOC RN for ostomy teaching assistance - Monitor leukocytosis; improved   - Appreciate cardiology assistance - Mobilization as feasible;PT following; recommending SNF - Further management per primary service; we will follow   - Discharge Planning: If leukocytosis continues to improve then she is stable for discharge form surgical perspective, hopefully in next 48 hours   All of the above  findings and recommendations were discussed with the patient, and the medical team, and all of patient's questions were answered to her expressed satisfaction.  -- Lynden Oxford, PA-C [facility] 03/20/2020, 7:42 AM [phone] M-F: 7am - 4pm

## 2020-03-20 NOTE — Progress Notes (Signed)
CH paged to visit pt. and answer questions re: whether she can take Bible and devotional to rehab w/her.  Pt. awake, pleasant, eager to go to rehab and requesting prayer for protection and 'a good transition'.  Pt. shared several scriptures which have been encouraging to her; she prays daily w/a 76yo prayer partner over the phone and has been doing this for years.  CH prayed for pt. and remains available as needed.  Pt. grateful for visit.

## 2020-03-20 NOTE — Discharge Summary (Addendum)
Physician Discharge Summary   [name]  female DOB: 1946-01-18  LOV:[id number]  PCP: Mickel Fuchs, MD  Admit date: 03/11/2020 Discharge date: 03/20/2020  Admitted From: home Disposition:  SNF CODE STATUS: DNR  Discharge Instructions    Increase activity slowly   Complete by: As directed    No wound care   Complete by: As directed        Hospital Course:  For full details, please see H&P, progress notes, consult notes and ancillary notes.  Briefly,  [name] a 74 y.o.femalewith medical history significant forchronic dCHF, A-fib on Eliquis, history of CVA, hypothyroidism, iron deficiency anemia, history of diverticulitis who presented to the ED on 03/11/20 with nausea, vomiting and abdominal pain and worsening abdominal distention, and no bowel movement in 5 days.    She recently had diverticulitis and underwent acolonoscopy in August due to concerns of possible sigmoid obstruction.Colonoscopy showed only significant sigmoid edema at that time.  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  Labs significant for leukocytosis of 13.3, sodium 126.  CT abdomen pelvis showed moderate diffuse colonic distention with a degree of obstruction at the sigmoid colon possibly secondary to muscular hypertrophy versus stricture. No active diverticulitis.  General surgery consulted.  On 10/11, patient had Gastrografin enema which showed complete colonic obstruction at the level of the mid to distal sigmoid colon.  Underwent subtotal colectomy on 10/11 with Dr. Toula Moos.  Pt was having large amount of liquid output via ileostomy, so fibercon was started for stool bulking.  Pt was maintaining hydration with oral intake without needing IVF and was tolerating regular diet prior to discharge.  Pt will follow up with GenSurg as outpatient for staple removal.  Leukocytosis WBC  trending up and peaked at 16.7 on 03/19/20.  UA 10/17, neg for infection.  CT a/p and CXR on 10/18 showed no infectious finding.  Pt remained afebrile.  WBC trended down to 14.4 on the day of discharge.  Continue to monitor for signs of infection.  Hypotension - resolved.   Post-operatively and overnight 10/11-12 had low BP's but maintained MAP's.   Suspect due to anesthesia medications and hypovolemia.  Responded to fluid bolus in PACU.  Hyponatremia -  secondary to hypovolemia from decrease PO intake initially, which improved with IV fluids.  Na dropped again later due to excess fluid loss from ileostomy, which improved after bulking agents and increased oral intake.  Urine studies neg for SIADH.  Na 129 on the day of discharge.    Hypomag Repleted PRN with IV mag  A fib with RVR in post-op setting  Acquired thrombophilia  Cardiology consulted, Dr. Juliann Pares.  Pt received IV amiodarone load and gtt and also digoxin.  Rate controlled prior to discharge.  Pt continued on regimen of amiodarone 200 mg daily, cardizem 250 mg daily and Toprol 50 mg daily.  continued Eliquis.  Pt needs outpatient f/u with cardiology 1-2 weeks after discharge  Chronic diastolic heart failure - compensated on admission, euvolemic.  Home lasix held due to low BP.   Hypothyroidism -  continued home Synthyroid   Discharge Diagnoses:  Principal Problem:   Large bowel obstruction (HCC) Active Problems:   Atrial fibrillation (HCC)   Hypothyroidism   Abdominal distention   Hyponatremia   Chronic diastolic heart failure (HCC)   Malnutrition of moderate degree    Discharge Instructions:  Allergies as of 03/20/2020  Reactions   Prednisone Hives   Predicort [prednisolone Acetate]    HIVES,      Medication List    STOP taking these medications   nitroGLYCERIN 0.4 MG SL tablet Commonly known as: NITROSTAT   NON FORMULARY     TAKE these medications   amiodarone 200 MG tablet Commonly known  as: PACERONE Take 1 tablet (200 mg total) by mouth daily.   apixaban 5 MG Tabs tablet Commonly known as: ELIQUIS Take 1 tablet (5 mg total) by mouth 2 (two) times daily.   calcium-vitamin D 500-200 MG-UNIT tablet Take 1 tablet by mouth 2 (two) times daily.   dicyclomine 10 MG capsule Commonly known as: BENTYL Take 10 mg by mouth every 6 (six) hours as needed.   diltiazem 240 MG 24 hr capsule Commonly known as: CARDIZEM CD Take 1 capsule (240 mg total) by mouth daily.   Ensure Max Protein Liqd Take 330 mLs (11 oz total) by mouth 2 (two) times daily.   folic acid 1 MG tablet Commonly known as: FOLVITE Take 1 mg by mouth daily. Per tube once daily   furosemide 20 MG tablet Commonly known as: LASIX Hold until followup with outpatient doctor, since your blood pressure is low, and you are already loosing lots fluids in your ostomy. What changed:   how much to take  how to take this  when to take this  additional instructions   guaiFENesin 600 MG 12 hr tablet Commonly known as: MUCINEX Take 1 tablet (600 mg total) by mouth 2 (two) times daily.   hydrocortisone 2.5 % rectal cream Commonly known as: ANUSOL-HC Apply 1 application topically 2 (two) times daily as needed.   levothyroxine 75 MCG tablet Commonly known as: SYNTHROID Take 75 mcg by mouth daily before breakfast.   metoprolol succinate 25 MG 24 hr tablet Commonly known as: TOPROL-XL Take 2 tablets (50 mg total) by mouth daily. What changed: when to take this   multivitamin with minerals Tabs tablet Take 1 tablet by mouth daily.   omeprazole 20 MG capsule Commonly known as: PRILOSEC Take 20 mg by mouth daily.   polycarbophil 625 MG tablet Commonly known as: FIBERCON Take 1 tablet (625 mg total) by mouth 3 (three) times daily.   potassium chloride 10 MEQ tablet Commonly known as: KLOR-CON Take 10 mEq by mouth daily.        Contact information for follow-up providers    Donovan Kail, PA-C  Follow up in 1 week(s).   Specialty: Physician Assistant Why: Follow up in 1 week for staple removal. Contact information: [address] [place] [address] [phone]        Mickel Fuchs, MD. Schedule an appointment as soon as possible for a visit in 1 week(s).   Specialty: Family Medicine Contact information: [address] [place] [address] [phone]            Contact information for after-discharge care    Destination    [facility] [place] [facility] .   Service: Skilled Nursing Contact information: [address] [place] [address] [phone]                  Allergies  Allergen Reactions  . Prednisone Hives  . Predicort [Prednisolone Acetate]     HIVES,     The results of significant diagnostics from this hospitalization (including imaging, microbiology, ancillary and laboratory) are listed below for reference.   Consultations:   Procedures/Studies:  DG Chest 1 View  Result Date: 03/13/2020 CLINICAL DATA:  Tachycardia EXAM: CHEST  1 VIEW COMPARISON:  02/22/2020 FINDINGS: There is cardiomegaly with small pleural effusions. No overt pulmonary edema. IMPRESSION: Cardiomegaly with small pleural effusions. Electronically Signed   By: Deatra Robinson M.D.   On: 03/13/2020 03:27   DG Chest 2 View  Result Date: 03/19/2020 CLINICAL DATA:  Pneumonia. EXAM: CHEST - 2 VIEW COMPARISON:  March 13, 2020. FINDINGS: Stable cardiomegaly. No pneumothorax is noted. Right-sided PICC line is unchanged in position. Mild bilateral pleural effusions are noted, right greater than left. Probable bibasilar atelectasis is noted as well. Bony thorax is unremarkable. IMPRESSION: Mild bilateral pleural effusions, right greater than left. Probable bibasilar atelectasis. Electronically Signed   By: Lupita Raider M.D.   On: 03/19/2020 13:12   DG Chest 2 View  Result Date: 02/22/2020 CLINICAL DATA:  Shortness of  breath EXAM: CHEST - 2 VIEW COMPARISON:  12/04/2019 FINDINGS: Cardiac shadow is stable. Aortic calcifications are noted. Bibasilar atelectatic changes are again seen with small effusions stable from prior exam. Mild vascular congestion is seen. No bony abnormality is noted. IMPRESSION: Stable bibasilar atelectasis/scarring. Increased vascular congestion Electronically Signed   By: Alcide Clever M.D.   On: 02/22/2020 01:38   CT ABDOMEN PELVIS W CONTRAST  Result Date: 03/19/2020 CLINICAL DATA:  Worsening leukocytosis 1 week following subtotal colectomy. Abdominal abscess/infection suspected. EXAM: CT ABDOMEN AND PELVIS WITH CONTRAST TECHNIQUE: Multidetector CT imaging of the abdomen and pelvis was performed using the standard protocol following bolus administration of intravenous contrast. CONTRAST:  OMNIPAQUE IOHEXOL 300 MG/ML  SOLN COMPARISON:  Abdominopelvic CT 03/11/2020 and 12/21/2019. FINDINGS: Lower chest: Slight interval enlargement of moderate right and small left pleural effusions with associated worsening atelectasis dependently in both lung bases. Stable cardiomegaly and coronary artery atherosclerosis. No significant pericardial fluid. Hepatobiliary: Stable appendix cysts. Stable mild extrahepatic biliary dilatation post cholecystectomy. Pancreas: Unremarkable. No pancreatic ductal dilatation or surrounding inflammatory changes. Spleen: Normal in size without focal abnormality. Adrenals/Urinary Tract: Both adrenal glands appear normal. Stable mild bilateral renal cortical thinning. No renal mass, urinary tract calculus or hydronephrosis. The bladder appears normal. Stomach/Bowel: Enteric contrast was administered and extends to the ileostomy in the right mid abdominal wall. There is mild diffuse dilatation of the stomach and small bowel without focal wall thickening or enteric contrast extravasation. Subtotal colectomy has been performed without evidence of suture breakdown at the [facility].  Vascular/Lymphatic: There are no enlarged abdominal or pelvic lymph nodes. Aortic and branch vessel atherosclerosis. The portal, superior mesenteric and splenic veins are patent. Reproductive: Enhancing mass anteriorly in the lower uterine segment measuring 4.4 x 3.1 cm on image 81/2, grossly stable from prior CTs and consistent with an anterior fibroid. No suspicious adnexal findings. Other: Small amount of pelvic and interloop ascites without suspicious peritoneal enhancement or focal extraluminal fluid collection. No free intraperitoneal air. Musculoskeletal: No acute or significant osseous findings. IMPRESSION: 1. Interval subtotal colectomy and ileostomy. Mild diffuse dilatation of the stomach and small bowel without focal wall thickening or enteric contrast extravasation, suggesting adynamic ileus. 2. Small amount of pelvic and interloop ascites without suspicious peritoneal enhancement or focal extraluminal fluid collection. 3. Slight interval enlargement of right greater than left pleural effusions with associated worsening atelectasis dependently in both lung bases. 4. Uterine fibroid. 5. Aortic Atherosclerosis (ICD10-I70.0). Electronically Signed   By: Carey Bullocks M.D.   On: 03/19/2020 12:20   CT ABDOMEN PELVIS W CONTRAST  Result Date: 03/11/2020  CLINICAL DATA:  74 year old female with abdominal distension. EXAM: CT ABDOMEN AND PELVIS WITH CONTRAST TECHNIQUE: Multidetector CT imaging of the abdomen and pelvis was performed using the standard protocol following bolus administration of intravenous contrast. CONTRAST:  OMNIPAQUE IOHEXOL 300 MG/ML  SOLN COMPARISON:  CT abdomen pelvis dated 12/21/2019. FINDINGS: Lower chest: Trace left and small right pleural effusions. Patchy right lung base consolidation, likely atelectasis. Pneumonia is not excluded clinical correlation is recommended. There is moderate cardiomegaly with biatrial dilatation. There is retrograde flow of contrast from the right  atrium into the IVC suggestive of a degree of right heart dysfunction. There is calcification of the mitral annulus. No intra-abdominal free air. Trace free fluid in the pelvis. Hepatobiliary: There is mild irregularity of the liver contour suggestive of early changes of cirrhosis. Bilobed or 2 adjacent cysts in the left lobe of the liver with combined dimension of 2 cm. No intrahepatic biliary ductal dilatation. Cholecystectomy. No retained calcified stone noted in the central CBD. Pancreas: No acute findings. Spleen: Normal in size without focal abnormality. Adrenals/Urinary Tract: The adrenal glands unremarkable. Mild bilateral parenchyma atrophy. There is no hydronephrosis on either side. There is symmetric enhancement and excretion of contrast by both kidneys. The visualized ureters and urinary bladder appear unremarkable. Stomach/Bowel: There is sigmoid diverticulosis with muscular hypertrophy. There is moderate diffuse colonic distension with air extending to the level of the sigmoid colon suggestive of a degree of obstruction of the sigmoid colon, likely related to muscular hypertrophy or stricture. Underlying mass is not excluded. Clinical correlation and follow-up recommended. Scattered tiny pockets of air along the wall of the cecum and proximal colon likely mixed with the colonic content and less likely pneumatosis. Correlation with lactic acid recommended to exclude bowel ischemia. There is a small hiatal hernia. No evidence of small-bowel obstruction. Vascular/Lymphatic: Mild aortoiliac atherosclerotic disease. The IVC is unremarkable. No portal venous gas. There is no adenopathy. Reproductive: The uterus is grossly unremarkable. Other: Small fat containing umbilical hernia. Musculoskeletal: No acute or significant osseous findings. IMPRESSION: 1. Moderate diffuse colonic distension likely related to a degree of obstruction of the sigmoid colon, secondary to muscular hypertrophy or stricture.  Underlying mass is not excluded. Clinical correlation and follow-up recommended. 2. Sigmoid diverticulosis with muscular hypertrophy. No active inflammatory changes. 3. Moderate cardiomegaly with biatrial dilatation. 4. Trace left and small right pleural effusions. Patchy right lung base consolidation, likely atelectasis. Pneumonia is not excluded. 5. Aortic Atherosclerosis (ICD10-I70.0). Electronically Signed   By: Elgie Collard M.D.   On: 03/11/2020 19:45   DG Chest Port 1 View  Result Date: 03/13/2020 CLINICAL DATA:  Dyspnea EXAM: PORTABLE CHEST 1 VIEW COMPARISON:  03/14/2019 FINDINGS: Cardiac shadow remains enlarged. Aortic calcifications are again seen. Small effusions are noted bilaterally right slightly greater than left. Mild left basilar atelectasis is seen. Mild central vascular congestion is noted as well. IMPRESSION: Small effusions right greater than left. Mild left basilar atelectasis is seen. Mild vascular congestion is noted as well. Electronically Signed   By: Alcide Clever M.D.   On: 03/13/2020 18:05   DG Abd 2 Views  Result Date: 03/11/2020 CLINICAL DATA:  Abdominal pain EXAM: ABDOMEN - 2 VIEW COMPARISON:  December 12, 2019 FINDINGS: There is marked gaseous dilation of loops colon. The cecum measures approximately 13 cm. No definitive free air. There are nonspecific air-fluid levels on upright radiographs. Small amount of air seen within the rectum. Bibasilar heterogeneous opacities, nonspecific. Surgical clips project over the upper abdomen. Degenerative changes  of the lower lumbar spine. IMPRESSION: 1. Marked gaseous dilation of loops of colon. Small amount of air seen within the rectum. Findings are concerning for colonic obstruction versus ileus. Recommend further evaluation with dedicated contrast enhanced CT. 2.  Bibasilar heterogeneous opacities, nonspecific. Electronically Signed   By: Meda Klinefelter MD   On: 03/11/2020 15:37   DG BE (COLON)W SINGLE CM (SOL OR THIN  BA)  Result Date: 03/12/2020 CLINICAL DATA:  Evaluate for colonic obstruction EXAM: SINGLE CONTRAST ENEMA TECHNIQUE: Initial scout AP supine abdominal image obtained. Contrast was introduced into the colon in a retrograde fashion and spot images were obtained. Dilute Omnipaque 300 contrast media was used. FLUOROSCOPY TIME:  Fluoroscopy Time:  1.2 minutes Radiation Exposure Index (if provided by the fluoroscopic device): 28.4 mGy Number of Acquired Spot Images: 12 COMPARISON:  CT 03/11/2020 FINDINGS: Single contrast enema was performed utilizing dilute Omnipaque 300. Contrast filled the rectum and distal sigmoid colon. There is abrupt luminal obstruction the level of the mid to distal sigmoid colon corresponding to site of high-grade luminal narrowing seen on CT. No contrast was able to traverse this level. No extraluminal contrast was visualized. Patient tolerated the procedure well. IMPRESSION: Complete colonic obstruction at the level of the mid to distal sigmoid colon. These results were called by telephone at the conclusion of the exam on 03/12/2020 at 10:53 a.m. to provider Lynden Oxford, PA, who verbally acknowledged these results. Electronically Signed   By: Duanne Guess D.O.   On: 03/12/2020 11:39   [place] EKG SITE RITE  Result Date: 03/13/2020 If Site Rite image not attached, placement could not be confirmed due to current cardiac rhythm.     Labs: BNP (last 3 results) Recent Labs    12/07/19 0751 03/13/20 0303 03/13/20 1718  BNP 686.2* 981.4* 419.8*   Basic Metabolic Panel: Recent Labs  Lab 03/16/20 0630 03/17/20 0533 03/18/20 0456 03/19/20 0325 03/20/20 0538  NA 127* 125* 127* 124* 129*  K 3.8 3.8 4.1 4.1 3.9  CL 95* 92* 96* 92* 97*  CO2 GLUCOSE 94 96 91 102* 96  BUN CREATININE 0.68 0.47 0.59 0.62 0.60  CALCIUM 8.2* 8.0* 7.9* 8.1* 8.2*  MG 1.9 1.8 1.7 1.6* 2.1   Liver Function Tests: No results for input(s): AST, ALT, ALKPHOS,  BILITOT, PROT, ALBUMIN in the last 168 hours. No results for input(s): LIPASE, AMYLASE in the last 168 hours. No results for input(s): AMMONIA in the last 168 hours. CBC: Recent Labs  Lab 03/16/20 0630 03/17/20 0533 03/18/20 0456 03/19/20 0325 03/20/20 0538  WBC 8.6 12.3* 13.5* 16.7* 14.4*  HGB 10.2* 10.8* 10.4* 10.5* 10.6*  HCT 30.3* 32.2* 30.8* 31.3* 31.8*  MCV 81.0 80.3 79.4* 80.5 79.7*  PLT 184 210 207 240 248   Cardiac Enzymes: No results for input(s): CKTOTAL, CKMB, CKMBINDEX, TROPONINI in the last 168 hours. BNP: Invalid input(s): POCBNP CBG: No results for input(s): GLUCAP in the last 168 hours. D-Dimer No results for input(s): DDIMER in the last 72 hours. Hgb A1c No results for input(s): HGBA1C in the last 72 hours. Lipid Profile No results for input(s): CHOL, HDL, LDLCALC, TRIG, CHOLHDL, LDLDIRECT in the last 72 hours. Thyroid function studies No results for input(s): TSH, T4TOTAL, T3FREE, THYROIDAB in the last 72 hours.  Invalid input(s): FREET3 Anemia work up No results for input(s): VITAMINB12, FOLATE, FERRITIN, TIBC, IRON, RETICCTPCT in the last 72 hours. Urinalysis    Component Value  Date/Time   COLORURINE YELLOW (A) 03/18/2020 1200   APPEARANCEUR TURBID (A) 03/18/2020 1200   APPEARANCEUR Clear 08/29/2011 0037   LABSPEC 1.010 03/18/2020 1200   LABSPEC 1.009 08/29/2011 0037   PHURINE 5.0 03/18/2020 1200   GLUCOSEU NEGATIVE 03/18/2020 1200   GLUCOSEU Negative 08/29/2011 0037   HGBUR SMALL (A) 03/18/2020 1200   BILIRUBINUR NEGATIVE 03/18/2020 1200   BILIRUBINUR Negative 08/29/2011 0037   KETONESUR NEGATIVE 03/18/2020 1200   PROTEINUR NEGATIVE 03/18/2020 1200   UROBILINOGEN 0.2 01/17/2011 0037   NITRITE NEGATIVE 03/18/2020 1200   LEUKOCYTESUR NEGATIVE 03/18/2020 1200   LEUKOCYTESUR 1+ 08/29/2011 0037   Sepsis Labs Invalid input(s): PROCALCITONIN,  WBC,  LACTICIDVEN Microbiology Recent Results (from the past 240 hour(s))  Respiratory Panel by RT  PCR (Flu A&B, Covid) - Nasopharyngeal Swab     Status: None   Collection Time: 03/12/20  1:58 AM   Specimen: Nasopharyngeal Swab  Result Value Ref Range Status   SARS Coronavirus 2 by RT PCR NEGATIVE NEGATIVE Final    Comment: (NOTE) SARS-CoV-2 target nucleic acids are NOT DETECTED.  The SARS-CoV-2 RNA is generally detectable in upper respiratoy specimens during the acute phase of infection. The lowest concentration of SARS-CoV-2 viral copies this assay can detect is 131 copies/mL. A negative result does not preclude SARS-Cov-2 infection and should not be used as the sole basis for treatment or other patient management decisions. A negative result may occur with  improper specimen collection/handling, submission of specimen other than nasopharyngeal swab, presence of viral mutation(s) within the areas targeted by this assay, and inadequate number of viral copies (<131 copies/mL). A negative result must be combined with clinical observations, patient history, and epidemiological information. The expected result is Negative.  Fact Sheet for Patients:  https://www.moore.com/  Fact Sheet for Healthcare Providers:  https://www.young.biz/  This test is no t yet approved or cleared by the [place] FDA and  has been authorized for detection and/or diagnosis of SARS-CoV-2 by FDA under an Emergency Use Authorization (EUA). This EUA will remain  in effect (meaning this test can be used) for the duration of the COVID-19 declaration under Section 564(b)(1) of the Act, 21 U.S.C. section 360bbb-3(b)(1), unless the authorization is terminated or revoked sooner.     Influenza A by PCR NEGATIVE NEGATIVE Final   Influenza B by PCR NEGATIVE NEGATIVE Final    Comment: (NOTE) The Xpert Xpress SARS-CoV-2/FLU/RSV assay is intended as an aid in  the diagnosis of influenza from Nasopharyngeal swab specimens and  should not be used as a sole basis for  treatment. Nasal washings and  aspirates are unacceptable for Xpert Xpress SARS-CoV-2/FLU/RSV  testing.  Fact Sheet for Patients: https://www.moore.com/  Fact Sheet for Healthcare Providers: https://www.young.biz/  This test is not yet approved or cleared by the [place] FDA and  has been authorized for detection and/or diagnosis of SARS-CoV-2 by  FDA under an Emergency Use Authorization (EUA). This EUA will remain  in effect (meaning this test can be used) for the duration of the  Covid-19 declaration under Section 564(b)(1) of the Act, 21  U.S.C. section 360bbb-3(b)(1), unless the authorization is  terminated or revoked. Performed at [facility], [address] [place], [place] [address]      Total time spend on discharging this patient, including the last patient exam, discussing the hospital stay, instructions for ongoing care as it relates to all pertinent caregivers, as well as preparing the medical discharge records, prescriptions, and/or referrals as applicable, is 40  minutes.    Darlin Priestlyina Abbigaile Rockman, MD  [facility] 03/20/2020, 10:06 AM  If 7PM-7AM, please contact night-coverage

## 2020-03-23 ENCOUNTER — Telehealth: Payer: Self-pay | Admitting: Family

## 2020-03-23 NOTE — Telephone Encounter (Signed)
Called and LVM in attempt to reschedule a no show appointment with the [facility] that was missed in September.   [place] Latulippe, NT

## 2020-03-25 ENCOUNTER — Emergency Department
Admission: EM | Admit: 2020-03-25 | Discharge: 2020-03-25 | Disposition: A | Discharge status: Home / Self Care | Payer: Medicare Other | Attending: Emergency Medicine | Admitting: Emergency Medicine

## 2020-03-25 ENCOUNTER — Emergency Department: Payer: Medicare Other

## 2020-03-25 ENCOUNTER — Other Ambulatory Visit: Payer: Self-pay

## 2020-03-25 DIAGNOSIS — T889XXA Complication of surgical and medical care, unspecified, initial encounter: Secondary | ICD-10-CM | POA: Insufficient documentation

## 2020-03-25 DIAGNOSIS — T8141XA Infection following a procedure, superficial incisional surgical site, initial encounter: Secondary | ICD-10-CM | POA: Diagnosis not present

## 2020-03-25 DIAGNOSIS — E860 Dehydration: Secondary | ICD-10-CM

## 2020-03-25 DIAGNOSIS — L02211 Cutaneous abscess of abdominal wall: Secondary | ICD-10-CM | POA: Insufficient documentation

## 2020-03-25 DIAGNOSIS — Y733 Surgical instruments, materials and gastroenterology and urology devices (including sutures) associated with adverse incidents: Secondary | ICD-10-CM | POA: Diagnosis not present

## 2020-03-25 DIAGNOSIS — Z79899 Other long term (current) drug therapy: Secondary | ICD-10-CM | POA: Diagnosis not present

## 2020-03-25 DIAGNOSIS — I5033 Acute on chronic diastolic (congestive) heart failure: Secondary | ICD-10-CM | POA: Insufficient documentation

## 2020-03-25 DIAGNOSIS — R109 Unspecified abdominal pain: Secondary | ICD-10-CM | POA: Diagnosis present

## 2020-03-25 DIAGNOSIS — E039 Hypothyroidism, unspecified: Secondary | ICD-10-CM | POA: Insufficient documentation

## 2020-03-25 DIAGNOSIS — I110 Hypertensive heart disease with heart failure: Secondary | ICD-10-CM | POA: Insufficient documentation

## 2020-03-25 DIAGNOSIS — E872 Acidosis, unspecified: Secondary | ICD-10-CM

## 2020-03-25 LAB — CBC WITH DIFFERENTIAL/PLATELET
Abs Immature Granulocytes: 0.63 10*3/uL — ABNORMAL HIGH (ref 0.00–0.07)
Basophils Absolute: 0.1 10*3/uL (ref 0.0–0.1)
Basophils Relative: 1 %
Eosinophils Absolute: 0.1 10*3/uL (ref 0.0–0.5)
Eosinophils Relative: 1 %
HCT: 33.7 % — ABNORMAL LOW (ref 36.0–46.0)
Hemoglobin: 11 g/dL — ABNORMAL LOW (ref 12.0–15.0)
Immature Granulocytes: 4 %
Lymphocytes Relative: 10 %
Lymphs Abs: 1.5 10*3/uL (ref 0.7–4.0)
MCH: 26.7 pg (ref 26.0–34.0)
MCHC: 32.6 g/dL (ref 30.0–36.0)
MCV: 81.8 fL (ref 80.0–100.0)
Monocytes Absolute: 0.9 10*3/uL (ref 0.1–1.0)
Monocytes Relative: 6 %
Neutro Abs: 12 10*3/uL — ABNORMAL HIGH (ref 1.7–7.7)
Neutrophils Relative %: 78 %
Platelets: 395 10*3/uL (ref 150–400)
RBC: 4.12 MIL/uL (ref 3.87–5.11)
RDW: 17.2 % — ABNORMAL HIGH (ref 11.5–15.5)
WBC: 15.2 10*3/uL — ABNORMAL HIGH (ref 4.0–10.5)
nRBC: 0 % (ref 0.0–0.2)

## 2020-03-25 LAB — BASIC METABOLIC PANEL
Anion gap: 11 (ref 5–15)
BUN: 8 mg/dL (ref 8–23)
CO2: 18 mmol/L — ABNORMAL LOW (ref 22–32)
Calcium: 8.4 mg/dL — ABNORMAL LOW (ref 8.9–10.3)
Chloride: 102 mmol/L (ref 98–111)
Creatinine, Ser: 0.74 mg/dL (ref 0.44–1.00)
GFR, Estimated: >60 mL/min (ref >60)
Glucose, Bld: 100 mg/dL — ABNORMAL HIGH (ref 70–99)
Potassium: 4.6 mmol/L (ref 3.5–5.1)
Sodium: 131 mmol/L — ABNORMAL LOW (ref 135–145)

## 2020-03-25 LAB — LACTIC ACID, PLASMA: Lactic Acid, Venous: 2.3 mmol/L (ref 0.5–1.9)

## 2020-03-25 MED ORDER — IOHEXOL 300 MG/ML  SOLN
100.0000 mL | Freq: Once | INTRAMUSCULAR | Status: AC | PRN
  Administered 2020-03-25: 100 mL via INTRAVENOUS

## 2020-03-25 MED ORDER — AMOXICILLIN-POT CLAVULANATE 875-125 MG PO TABS: 1.0000 | ORAL_TABLET | Freq: Two times a day (BID) | ORAL | 0 refills | Status: DC

## 2020-03-25 MED ORDER — LACTATED RINGERS IV BOLUS
1000.0000 mL | Freq: Once | INTRAVENOUS | Status: AC
  Administered 2020-03-25: 1000 mL via INTRAVENOUS

## 2020-03-25 MED ORDER — IOHEXOL 300 MG/ML  SOLN: 100.0000 mL | Freq: Once | INTRAMUSCULAR | Status: DC | PRN

## 2020-03-25 MED ORDER — IOHEXOL 9 MG/ML PO SOLN
500.0000 mL | Freq: Two times a day (BID) | ORAL | Status: DC | PRN
  Administered 2020-03-25: 500 mL via ORAL

## 2020-03-25 MED ORDER — SODIUM CHLORIDE 0.9 % IV SOLN
3.0000 g | Freq: Once | INTRAVENOUS | Status: AC
  Administered 2020-03-25: 3 g via INTRAVENOUS
  Filled 2020-03-25: qty 8

## 2020-03-25 NOTE — ED Notes (Signed)
Date and time results received: 03/25/20 5:46pm (use smartphrase ".now" to insert current time)  Test: Lactic Acid  Critical Value: 2.3  Name of Provider Notified:Dylan Katrinka Blazing   Orders Received? Or Actions Taken?: none

## 2020-03-25 NOTE — ED Notes (Signed)
Patient transported to CT 

## 2020-03-25 NOTE — ED Notes (Signed)
MD at bedside. 

## 2020-03-25 NOTE — Discharge Instructions (Signed)
[name] has a superficial wound infection, a small abscess beneath her surgical incision.  She was evaluated by our general surgeon who removed a couple staples from her incision and drainage of this abscess.  Please perform twice daily dressing changes to patient's wound and her packing.    We switched her antibiotics from Keflex to Augmentin.  Please discontinue the Keflex and switch to Augmentin twice daily for the next 10 days.  Ensure she follows up this week with her surgical appointment with Dr. Zoila Shutter.  Return to the ED with any worsening symptoms.

## 2020-03-25 NOTE — ED Notes (Signed)
  Report called to [employer]

## 2020-03-25 NOTE — ED Notes (Signed)
Pt back from CT

## 2020-03-25 NOTE — ED Notes (Signed)
Call son Giavanna Kang   [phone], and gave an update

## 2020-03-25 NOTE — ED Provider Notes (Signed)
[facility] Emergency Department Provider Note ____________________________________________   First MD Initiated Contact with Patient 03/25/20 1640     (approximate)  I have reviewed the triage vital signs and the nursing notes.  HISTORY  Chief Complaint Post-op Problem (Per EMS patient family wants pt evaluated for ( surgical site infection ). Pt had a bowl obtruction aprox 1-week ago at [facility] )   HPI [name] is a 74 y.o. femalewho presents to the ED for evaluation of her surgical site.  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  Patient is a DNR.  Patient was discharged to local SNF.  Otherwise history of A. fib on Eliquis, CVA, hypothyroidism, IDA and diverticulitis.  Patient reports "doing okay" since her surgery.  She reports that she has had discharge to the inferior portion of her midline abdominal surgical wound for the past "week or so.  "Review of records from her SNF indicates that she was started on Keflex 2 days ago.  Patient reports constant aching abdominal pain since her surgery is 3/10 intensity.  She denies emesis and denies changes to the consistency of her ostomy output.  Denies subjective fevers or chills and reports compliance with Keflex.  She reports coming to the ED at the urging of her family for evaluation of repeat bowel obstruction.    Past Medical History:  Diagnosis Date  . Acute respiratory failure (HCC)    Secondary to aspiration pneumonia   . Altered mental status    Secondary to viral herpes simplex virus encephalitis  . Anemia   . Atrial fibrillation (HCC)   . Bilateral pneumonia   . CHF (congestive heart failure) (HCC)   . Dysphasia   . Encephalitis   . Hyperglycemia   . Hypertension   . Hyponatremia   . IBS (irritable bowel syndrome)   . Seizures (HCC)   . Stroke [facility])     Patient Active Problem List    Diagnosis Date Noted  . Malnutrition of moderate degree 03/19/2020  . Hyponatremia 03/12/2020  . Chronic diastolic heart failure (HCC) 03/12/2020  . Large bowel obstruction (HCC) 03/12/2020  . Abdominal distention 03/11/2020  . Iron deficiency anemia 12/20/2019  . Acute on chronic diastolic CHF (congestive heart failure) (HCC) 12/04/2019  . Atrial fibrillation (HCC)   . Hypertension   . Stroke (HCC)   . Hypothyroidism   . Acute respiratory failure with hypoxia (HCC)   . Acute congestive heart failure (HCC) 03/17/2019  . Acute gastroenteritis 07/05/2017  . Acute respiratory failure (HCC) 03/27/2011    Past Surgical History:  Procedure Laterality Date  . CHOLECYSTECTOMY    . COLECTOMY WITH COLOSTOMY CREATION/HARTMANN PROCEDURE N/A 03/12/2020   Procedure: COLECTOMY WITH COLOSTOMY CREATION/HARTMANN PROCEDURE;  Surgeon: Campbell Lerner, MD;  Location: [facility] ORS;  Service: General;  Laterality: N/A;  . COLONOSCOPY N/A 01/17/2020   Procedure: COLONOSCOPY;  Surgeon: Regis Bill, MD;  Location: [facility] ENDOSCOPY;  Service: Endoscopy;  Laterality: N/A;  . COLONOSCOPY WITH PROPOFOL N/A 12/31/2016   Procedure: COLONOSCOPY WITH PROPOFOL;  Surgeon: Scot Jun, MD;  Location: [facility] ENDOSCOPY;  Service: Endoscopy;  Laterality: N/A;  . ESOPHAGOGASTRODUODENOSCOPY (EGD) WITH PROPOFOL N/A 12/31/2016   Procedure: ESOPHAGOGASTRODUODENOSCOPY (EGD) WITH PROPOFOL;  Surgeon: Scot Jun, MD;  Location: [facility] ENDOSCOPY;  Service: Endoscopy;  Laterality: N/A;  . LAPAROTOMY N/A 03/12/2020   Procedure: EXPLORATORY LAPAROTOMY;  Surgeon: Campbell Lerner, MD;  Location: [facility] ORS;  Service: General;  Laterality: N/A;  . TONSILLECTOMY    . TUBAL LIGATION      Prior to Admission medications   Medication Sig Start Date End Date Taking? Authorizing Provider  acetaminophen (TYLENOL) 325 MG tablet Take 650 mg by mouth every 6 (six) hours as needed.   Yes [provider]  amiodarone (PACERONE) 200  MG tablet Take 1 tablet (200 mg total) by mouth daily. 03/20/20  Yes Darlin Priestly, MD  apixaban (ELIQUIS) 5 MG TABS tablet Take 1 tablet (5 mg total) by mouth 2 (two) times daily. 03/20/19  Yes Altamese Dilling, MD  cephALEXin (KEFLEX) 500 MG capsule Take 500 mg by mouth 4 (four) times daily.   Yes [provider]  dicyclomine (BENTYL) 10 MG capsule Take 10 mg by mouth every 6 (six) hours as needed. 12/12/19  Yes [provider]  diltiazem (CARDIZEM CD) 240 MG 24 hr capsule Take 1 capsule (240 mg total) by mouth daily. 03/21/19  Yes Altamese Dilling, MD  Ensure Max Protein (ENSURE MAX PROTEIN) LIQD Take 330 mLs (11 oz total) by mouth 2 (two) times daily. 03/20/20  Yes Darlin Priestly, MD  folic acid (FOLVITE) 1 MG tablet Take 1 mg by mouth daily. Per tube once daily     Yes [provider]  furosemide (LASIX) 20 MG tablet Hold until followup with outpatient doctor, since your blood pressure is low, and you are already loosing lots fluids in your ostomy. 03/20/20  Yes Darlin Priestly, MD  guaiFENesin (MUCINEX) 600 MG 12 hr tablet Take 1 tablet (600 mg total) by mouth 2 (two) times daily. 03/20/20  Yes Darlin Priestly, MD  hydrocortisone (ANUSOL-HC) 2.5 % rectal cream Apply 1 application topically 2 (two) times daily as needed. 02/28/20  Yes [provider]  levothyroxine (SYNTHROID) 75 MCG tablet Take 75 mcg by mouth daily before breakfast.    Yes [provider]  metoprolol succinate (TOPROL-XL) 25 MG 24 hr tablet Take 2 tablets (50 mg total) by mouth daily. 03/20/20  Yes Darlin Priestly, MD  Multiple Vitamin (MULTIVITAMIN WITH MINERALS) TABS tablet Take 1 tablet by mouth daily. 03/20/20  Yes Darlin Priestly, MD  omeprazole (PRILOSEC) 20 MG capsule Take 20 mg by mouth daily.   Yes [provider]  polycarbophil (FIBERCON) 625 MG tablet Take 1 tablet (625 mg total) by mouth 3 (three) times daily. 03/20/20  Yes Darlin Priestly, MD  potassium chloride (KLOR-CON) 10 MEQ tablet  Take 10 mEq by mouth daily.   Yes [provider]  traMADol (ULTRAM) 50 MG tablet Take 50 mg by mouth every 6 (six) hours as needed.   Yes [provider]  amoxicillin-clavulanate (AUGMENTIN) 875-125 MG tablet Take 1 tablet by mouth 2 (two) times daily for 10 days. 03/25/20 04/04/20  Delton Prairie, MD    Allergies Prednisone and Predicort [prednisolone acetate]  Family History  Problem Relation Age of Onset  . Prostate cancer Father   . Breast cancer Maternal Aunt   . Breast cancer Maternal Aunt   . Cerebral palsy Son     Social History Social History   Tobacco Use  . Smoking status: Never Smoker  . Smokeless tobacco: Never Used  Vaping Use  . Vaping Use: Never used  Substance Use Topics  . Alcohol use: No  . Drug use: No    Review of Systems  Constitutional: No fever/chills Eyes: No visual changes. ENT: No sore throat. Cardiovascular: Denies chest pain. Respiratory: Denies shortness of breath. Gastrointestinal:  No  nausea, no vomiting.  No diarrhea.  No constipation. Positive for abdominal pain Genitourinary: Negative for dysuria. Musculoskeletal: Negative for back pain. Skin: Negative for rash. Neurological: Negative for headaches, focal weakness or numbness.  ____________________________________________   PHYSICAL EXAM:  VITAL SIGNS: Vitals:   03/25/20 2000 03/25/20 2015  BP: (!) 101/59   Pulse: 94 87  Resp: 18   Temp:    SpO2: 95% 99%      Constitutional: Alert and oriented. Well appearing and in no acute distress.  Supine and conversational in full sentences. Eyes: Conjunctivae are normal. PERRL. EOMI. Head: Atraumatic. Nose: No congestion/rhinnorhea. Mouth/Throat: Mucous membranes are moist.  Oropharynx non-erythematous. Neck: No stridor. No cervical spine tenderness to palpation. Cardiovascular: Normal rate, regular rhythm. Grossly normal heart sounds.  Good peripheral circulation. Respiratory: Normal respiratory effort.  No  retractions. Lungs CTAB. Gastrointestinal: Soft , nondistended,.  Midline surgical staples in place .  Inferior staples have a small amount of erythema at the skin insertion site, but no diffuse erythema around the surgical incision.  No induration. ABD in place beneath her undergarments to the inferior margin of her wound with small amounts of dark discharge.  I am able to express dark discharge from the inferior margin of her wound which causes discomfort, I am able to express a small amount of purulence as well. Her abdomen has some generalized tenderness without peritoneal features. Musculoskeletal: No lower extremity tenderness nor edema.  No joint effusions. No signs of acute trauma. Neurologic:  Normal speech and language. No gross focal neurologic deficits are appreciated. No gait instability noted. Skin:  Skin is warm, dry and intact. No rash noted. Psychiatric: Mood and affect are normal. Speech and behavior are normal.  ____________________________________________   LABS (all labs ordered are listed, but only abnormal results are displayed)  Labs Reviewed  CBC WITH DIFFERENTIAL/PLATELET - Abnormal; Notable for the following components:      Result Value   WBC 15.2 (*)    Hemoglobin 11.0 (*)    HCT 33.7 (*)    RDW 17.2 (*)    Neutro Abs 12.0 (*)    Abs Immature Granulocytes 0.63 (*)    All other components within normal limits  BASIC METABOLIC PANEL - Abnormal; Notable for the following components:   Sodium 131 (*)    CO2 18 (*)    Glucose, Bld 100 (*)    Calcium 8.4 (*)    All other components within normal limits  LACTIC ACID, PLASMA - Abnormal; Notable for the following components:   Lactic Acid, Venous 2.3 (*)    All other components within normal limits  AEROBIC/ANAEROBIC CULTURE (SURGICAL/DEEP WOUND)   ____________________________________________  12 Lead EKG   ____________________________________________  RADIOLOGY  ED MD interpretation: CT reviewed by me  with evidence of superficial abscess in her anterior abdominal wall  Official radiology report(s): CT ABDOMEN PELVIS W CONTRAST  Result Date: 03/25/2020 CLINICAL DATA:  Postop 1 week subtotal colectomy. Purulence drainage from wound. Evaluate for abscess. colectomy for bowel obstruction. EXAM: CT ABDOMEN AND PELVIS WITH CONTRAST TECHNIQUE: Multidetector CT imaging of the abdomen and pelvis was performed using the standard protocol following bolus administration of intravenous contrast. CONTRAST:  OMNIPAQUE IOHEXOL 300 MG/ML  SOLN COMPARISON:  CT 03/19/2020 FINDINGS: Lower chest: Improving bilateral pleural effusions. Moderate effusion remains on the RIGHT. Near complete resolution of LEFT effusion. There is passive atelectasis in the RIGHT lower lobe. Hepatobiliary: Several hypodense lesions in liver unchanged consistent with cysts. Postcholecystectomy. Pancreas: Pancreas is  normal. No ductal dilatation. No pancreatic inflammation. Spleen: Normal spleen Adrenals/urinary tract: Adrenal glands and kidneys are normal. The ureters and bladder normal. Stomach/Bowel: Stomach is distended with oral contrast. There is mild dilatation of the proximal small bowel 3.2 cm similar comparison CT. Contrast flows in the mid small bowel but does not reach the ileostomy in the course of the study. There is no transition point. RIGHT abdominal wall ileostomy without complicating features. Vascular/Lymphatic: Abdominal aorta is normal caliber with atherosclerotic calcification. There is no retroperitoneal or periportal lymphadenopathy. No pelvic lymphadenopathy. Reproductive: Uterus normal Other: Interval reduction in intraperitoneal free fluid seen on comparison exam. Small amount fluid remains in the RIGHT abdominal peritoneal space. Near complete resolution of fluid within the pelvis. Small subcutaneous fluid collection along the inferior margin of the surgical wound staples is increased in diameter compared to prior.  This triangular collection measures 2.3 x 3.0 cm compared to 1.2 x 1.5 cm on comparison exam. This collection is superficial to the muscle layer does not appear to communicate with the peritoneal space. Musculoskeletal: No aggressive osseous lesion. IMPRESSION: 1. Increased organization of fluid collection along the inferior margin of the midline ventral wound. This collection does not appear to communicate with the peritoneal space. 2. Persistent distended stomach and dilated proximal small bowel with no obstructing lesion or transition point. Findings continue to suggest adynamic ileus. No evidence obstruction of the RIGHT lower quadrant ileostomy. 3. Interval decrease in intraperitoneal free fluid. 4. Interval decrease in pleural effusions. Electronically Signed   By: Genevive Bi M.D.   On: 03/25/2020 18:45   ____________________________________________   PROCEDURES and INTERVENTIONS  Procedure(s) performed (including Critical Care):  .1-3 Lead EKG Interpretation Performed by: Delton Prairie, MD Authorized by: Delton Prairie, MD     Interpretation: normal     ECG rate:  86   ECG rate assessment: normal     Rhythm: sinus rhythm     Ectopy: none     Conduction: normal      Medications  iohexol (OMNIPAQUE) 300 MG/ML solution 100 mL (has no administration in time range)  iohexol (OMNIPAQUE) 300 MG/ML solution 100 mL (100 mLs Intravenous Contrast Given 03/25/20 1818)  lactated ringers bolus 1,000 mL (1,000 mLs Intravenous New Bag/Given 03/25/20 1909)  Ampicillin-Sulbactam (UNASYN) 3 g in sodium chloride 0.9 % 100 mL IVPB (0 g Intravenous Stopped 03/25/20 2026)    ____________________________________________   MDM / ED COURSE  74 year old woman 1 week postop from colectomy and ileostomy placement, presenting with incisional drainage consistent with postoperative superficial abscess requiring bedside drainage and switching of antibiotics, but ultimately amenable to outpatient  management.  Normal vital signs on room air and patient maintained hemodynamic stability throughout her stay in the ED.  Exam with purulent discharge from the inferior margin of her surgical wound, no overlying evidence of cellulitis or evidence of a peritoneal abdomen.  Blood work demonstrates acute infection with leukocytosis and lactic acidosis, for which she received 1 L IVF and a dose of IV Unasyn.  CT imaging obtained to rule out postoperative complications, and does demonstrate evidence of superficial intra-abdominal wall abscess.  Consulted general surgery who evaluates the patient the bedside, drained this abscess and recommends dressing changes and packing to be performed at her SNF.  No indications for inpatient admission at this time.  We will switch her antibiotics to Augmentin to help cover appropriate organisms.  Son and family updated, instructions provided to SNF.  Patient medically stable to discharge to her facility.  Clinical Course as of Mar 25 2228  Wynelle LinkSun Mar 25, 2020  [phone] Spoke with Dr. Lady Garyannon, gen surg on call. She's reviewing CT. About to to start an appendectomy in the OR.  She will come evaluate the patient after this.  Requests Unasyn.   [DS]  2005 Reassessed.  Patient reports controlled pain.  Educated patient on subcutaneous abscess and upcoming evaluation by Dr. Lady Garyannon.  Answered questions.   [DS]  2224 Can and has evaluated the patient and opened up a couple inferior staples to drained superficial abscess.  She does not believe that patient needs to stay in the hospital and provides recommendations for SNF management with packing and switching antibiotics   [DS]    Clinical Course User Index [DS] Delton PrairieSmith, Taje Littler, MD     ____________________________________________   FINAL CLINICAL IMPRESSION(S) / ED DIAGNOSES  Final diagnoses:  Infection of superficial incisional surgical site after procedure, initial encounter  Abdominal wall abscess  Lactic acidosis  Dehydration      ED Discharge Orders         Ordered    amoxicillin-clavulanate (AUGMENTIN) 875-125 MG tablet  2 times daily        03/25/20 2227           Alasia Enge   Note:  This document was prepared using Dragon voice recognition software and may include unintentional dictation errors.   Delton PrairieSmith, Addalie Calles, MD 03/25/20 2231

## 2020-03-25 NOTE — ED Notes (Signed)
[facility] to provide transport to facility.

## 2020-03-25 NOTE — Consult Note (Signed)
Reason for Consult: Surgical wound infection Referring Physician: Delton Prairie, MD (emergency medicine)  [name] is an 74 y.o. female.  HPI: She was recently hospitalized for a large bowel obstruction and underwent subtotal colectomy with end ileostomy on March 12, 2020, with my partner Dr. Claudine Mouton.  She was discharged on October 19 to a skilled nursing facility.  She reports that she has had some drainage from the inferior part of her wound for about a week or so.  Records from her skilled nursing facility indicate that she was started on Keflex 2 days ago.  She has had aching abdominal pain since her surgery.  Her ostomy has been functioning normally.  No fevers or chills.  She came to the emergency department today at the urging of her son to have her wound evaluated.  In the emergency department, she was found to have a mild leukocytosis of 15.2 thousand, but upon discharge she also had elevated white blood cells at about 14,000.  CT scan of the abdomen and pelvis demonstrated findings in her midline wound suggestive of a superficial surgical wound infection.  General surgery has been consulted in this context for further evaluation and management.  Past Medical History:  Diagnosis Date  . Acute respiratory failure (HCC)    Secondary to aspiration pneumonia   . Altered mental status    Secondary to viral herpes simplex virus encephalitis  . Anemia   . Atrial fibrillation (HCC)   . Bilateral pneumonia   . CHF (congestive heart failure) (HCC)   . Dysphasia   . Encephalitis   . Hyperglycemia   . Hypertension   . Hyponatremia   . IBS (irritable bowel syndrome)   . Seizures (HCC)   . Stroke [facility])     Past Surgical History:  Procedure Laterality Date  . CHOLECYSTECTOMY    . COLECTOMY WITH COLOSTOMY CREATION/HARTMANN PROCEDURE N/A 03/12/2020   Procedure: COLECTOMY WITH COLOSTOMY CREATION/HARTMANN PROCEDURE;  Surgeon: Campbell Lerner, MD;  Location: [facility] ORS;  Service: General;   Laterality: N/A;  . COLONOSCOPY N/A 01/17/2020   Procedure: COLONOSCOPY;  Surgeon: Regis Bill, MD;  Location: [facility] ENDOSCOPY;  Service: Endoscopy;  Laterality: N/A;  . COLONOSCOPY WITH PROPOFOL N/A 12/31/2016   Procedure: COLONOSCOPY WITH PROPOFOL;  Surgeon: Scot Jun, MD;  Location: [facility] ENDOSCOPY;  Service: Endoscopy;  Laterality: N/A;  . ESOPHAGOGASTRODUODENOSCOPY (EGD) WITH PROPOFOL N/A 12/31/2016   Procedure: ESOPHAGOGASTRODUODENOSCOPY (EGD) WITH PROPOFOL;  Surgeon: Scot Jun, MD;  Location: [facility] ENDOSCOPY;  Service: Endoscopy;  Laterality: N/A;  . LAPAROTOMY N/A 03/12/2020   Procedure: EXPLORATORY LAPAROTOMY;  Surgeon: Campbell Lerner, MD;  Location: [facility] ORS;  Service: General;  Laterality: N/A;  . TONSILLECTOMY    . TUBAL LIGATION      Family History  Problem Relation Age of Onset  . Prostate cancer Father   . Breast cancer Maternal Aunt   . Breast cancer Maternal Aunt   . Cerebral palsy Son     Social History:  reports that she has never smoked. She has never used smokeless tobacco. She reports that she does not drink alcohol and does not use drugs.  Allergies:  Allergies  Allergen Reactions  . Prednisone Hives  . Predicort [Prednisolone Acetate]     HIVES,    Medications: I have reviewed the patient's current medications.  Results for orders placed or performed during the hospital encounter of 03/25/20 (from the past 48 hour(s))  CBC with Differential     Status: Abnormal  Collection Time: 03/25/20  5:01 PM  Result Value Ref Range   WBC 15.2 (H) 4.0 - 10.5 K/uL   RBC 4.12 3.87 - 5.11 MIL/uL   Hemoglobin 11.0 (L) 12.0 - 15.0 g/dL   HCT 19.4 (L) 36 - 46 %   MCV 81.8 80.0 - 100.0 fL   MCH 26.7 26.0 - 34.0 pg   MCHC 32.6 30.0 - 36.0 g/dL   RDW 17.4 (H) 08.1 - 44.8 %   Platelets 395 150 - 400 K/uL   nRBC 0.0 0.0 - 0.2 %   Neutrophils Relative % 78 %   Neutro Abs 12.0 (H) 1.7 - 7.7 K/uL   Lymphocytes Relative 10 %   Lymphs Abs 1.5 0.7 - 4.0  K/uL   Monocytes Relative 6 %   Monocytes Absolute 0.9 0.1 - 1.0 K/uL   Eosinophils Relative 1 %   Eosinophils Absolute 0.1 0.0 - 0.5 K/uL   Basophils Relative 1 %   Basophils Absolute 0.1 0.0 - 0.1 K/uL   Immature Granulocytes 4 %   Abs Immature Granulocytes 0.63 (H) 0.00 - 0.07 K/uL    Comment: Performed at [facility], [address], [place], [place] [address]  Basic metabolic panel     Status: Abnormal   Collection Time: 03/25/20  5:01 PM  Result Value Ref Range   Sodium 131 (L) 135 - 145 mmol/L   Potassium 4.6 3.5 - 5.1 mmol/L   Chloride 102 98 - 111 mmol/L   CO2 18 (L) 22 - 32 mmol/L   Glucose, Bld 100 (H) 70 - 99 mg/dL    Comment: Glucose reference range applies only to samples taken after fasting for at least 8 hours.   BUN 8 8 - 23 mg/dL   Creatinine, Ser 1.49 0.44 - 1.00 mg/dL   Calcium 8.4 (L) 8.9 - 10.3 mg/dL   GFR, Estimated >70 >26 mL/min    Comment: (NOTE) Calculated using the CKD-EPI Creatinine Equation (2021)    Anion gap 11 5 - 15    Comment: Performed at [facility], [address] [place], [place] [address]  Lactic acid, plasma     Status: Abnormal   Collection Time: 03/25/20  5:01 PM  Result Value Ref Range   Lactic Acid, Venous 2.3 (HH) 0.5 - 1.9 mmol/L    Comment: CRITICAL RESULT CALLED TO, READ BACK BY AND VERIFIED WITH [facility] RN AT 1746 ON 03/25/20 [facility] Performed at [facility], [address], [place], [place] [address]     CT ABDOMEN PELVIS W CONTRAST  Result Date: 03/25/2020 CLINICAL DATA:  Postop 1 week subtotal colectomy. Purulence drainage from wound. Evaluate for abscess. colectomy for bowel obstruction. EXAM: CT ABDOMEN AND PELVIS WITH CONTRAST TECHNIQUE: Multidetector CT imaging of the abdomen and pelvis was performed using the standard protocol following bolus administration of intravenous contrast. CONTRAST:  OMNIPAQUE IOHEXOL 300 MG/ML  SOLN COMPARISON:  CT 03/19/2020 FINDINGS: Lower chest:  Improving bilateral pleural effusions. Moderate effusion remains on the RIGHT. Near complete resolution of LEFT effusion. There is passive atelectasis in the RIGHT lower lobe. Hepatobiliary: Several hypodense lesions in liver unchanged consistent with cysts. Postcholecystectomy. Pancreas: Pancreas is normal. No ductal dilatation. No pancreatic inflammation. Spleen: Normal spleen Adrenals/urinary tract: Adrenal glands and kidneys are normal. The ureters and bladder normal. Stomach/Bowel: Stomach is distended with oral contrast. There is mild dilatation of the proximal small bowel 3.2 cm similar comparison CT. Contrast flows in the mid small bowel but does not  reach the ileostomy in the course of the study. There is no transition point. RIGHT abdominal wall ileostomy without complicating features. Vascular/Lymphatic: Abdominal aorta is normal caliber with atherosclerotic calcification. There is no retroperitoneal or periportal lymphadenopathy. No pelvic lymphadenopathy. Reproductive: Uterus normal Other: Interval reduction in intraperitoneal free fluid seen on comparison exam. Small amount fluid remains in the RIGHT abdominal peritoneal space. Near complete resolution of fluid within the pelvis. Small subcutaneous fluid collection along the inferior margin of the surgical wound staples is increased in diameter compared to prior. This triangular collection measures 2.3 x 3.0 cm compared to 1.2 x 1.5 cm on comparison exam. This collection is superficial to the muscle layer does not appear to communicate with the peritoneal space. Musculoskeletal: No aggressive osseous lesion. IMPRESSION: 1. Increased organization of fluid collection along the inferior margin of the midline ventral wound. This collection does not appear to communicate with the peritoneal space. 2. Persistent distended stomach and dilated proximal small bowel with no obstructing lesion or transition point. Findings continue to suggest adynamic ileus. No  evidence obstruction of the RIGHT lower quadrant ileostomy. 3. Interval decrease in intraperitoneal free fluid. 4. Interval decrease in pleural effusions. Electronically Signed   By: Genevive BiStewart  Edmunds M.D.   On: 03/25/2020 18:45    Review of Systems  Constitutional: Negative for chills and fever.  Gastrointestinal: Positive for abdominal pain. Negative for nausea and vomiting.  All other systems reviewed and are negative. Gust in the history of present illness. Blood pressure (!) 101/59, pulse 87, temperature 98.5 F (36.9 C), temperature source Oral, resp. rate 18, height 5\' 5"  (1.651 m), weight 68 kg, SpO2 99 %. Physical Exam Vitals reviewed.  Constitutional:      General: She is not in acute distress.    Appearance: Normal appearance.  HENT:     Head: Normocephalic and atraumatic.     Nose:     Comments: Covered with a mask    Mouth/Throat:     Comments: Covered with a mask Eyes:     General: No scleral icterus.       Right eye: No discharge.        Left eye: No discharge.  Cardiovascular:     Rate and Rhythm: Normal rate and regular rhythm.  Pulmonary:     Effort: Pulmonary effort is normal. No respiratory distress.  Abdominal:     General: Bowel sounds are normal.     Palpations: Abdomen is soft.       Comments: Ileostomy productive of thin green-brown stool.  There is purulent drainage oozing between the staples at about the lower 4 cm of her wound.  These were removed and frank pus was extruded.  Skin:    General: Skin is warm and dry.  Neurological:     General: No focal deficit present.     Mental Status: She is alert.  Psychiatric:        Mood and Affect: Mood normal.        Behavior: Behavior normal.     Assessment/Plan: This is a 74 year old woman who is recently status post subtotal colectomy presenting to the emergency department with a superficial wound infection.  I removed 4 staples from her incision.  I irrigated and probed the wound to remove all of  the purulent material.  I did collect a culture prior to doing so.  I then packed the wound with half centimeter iodoform gauze and covered it with dry gauze and an ABD pad.  I  do not think she requires admission to the hospital for management.  Her nursing facility should change the dressing twice daily.  We have also changed her oral antibiotic from Keflex to Augmentin.  She is scheduled for a follow-up visit in the general surgery clinic on Tuesday at which time her wound can be further evaluated.  I discussed my findings with the emergency department attending and also contacted the patient's son to let him know.  Duanne Guess 03/25/2020, 10:38 PM

## 2020-03-25 NOTE — ED Notes (Signed)
Son information   [name]   [phone]

## 2020-03-25 NOTE — ED Notes (Signed)
Pt taken by CT 

## 2020-03-25 NOTE — ED Triage Notes (Signed)
Per EMS pt had a bowl obstruction aprox 1-2 weeks ago . Family wants surgical site evaluated for infection. Pt states is having mild to moderate discomfort.

## 2020-03-25 NOTE — ED Notes (Signed)
Discharge instructions reviewed with patient, Pt AOx4, denied pain or sob. Discharge paper work given to EMS

## 2020-03-27 ENCOUNTER — Ambulatory Visit (INDEPENDENT_AMBULATORY_CARE_PROVIDER_SITE_OTHER): Payer: Medicare Other | Admitting: Physician Assistant

## 2020-03-27 ENCOUNTER — Other Ambulatory Visit: Payer: Self-pay

## 2020-03-27 ENCOUNTER — Encounter: Payer: Self-pay | Admitting: Physician Assistant

## 2020-03-27 VITALS — BP 96/67 | HR 103 | Temp 98.1°F | Resp 14 | Ht 65.0 in | Wt 150.0 lb

## 2020-03-27 DIAGNOSIS — Z09 Encounter for follow-up examination after completed treatment for conditions other than malignant neoplasm: Secondary | ICD-10-CM

## 2020-03-27 NOTE — Patient Instructions (Signed)
Pease change the Colostomy base and bag today. We removed the staples today and placed steri strips. These will begin to fall off within 10 days.  Please continue to remove the old packing from the wound and replace with new packing twice daily.   Please see follow up appointment listed below.

## 2020-03-27 NOTE — Progress Notes (Signed)
[facility] POST-OP OFFICE VISIT  03/27/2020  HPI: [name] is a 74 y.o. 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   She presented to the ED on 10/24 secondary post-op problem and wound to have wound infection to the inferior portion of her midline wound. Dr Lady Gary removed staples in the ED and revealed purulent drainage. Wound was packed and she was discharged on Augmentin. CX from this are growing gram (+) cocci, gram (-) rods, and gram (+) rods.   Today, she reports that she is doing better slowly. Still with intermittent abdominal pain at the incision, mostly with ambulation. No nausea, emesis. Ileostomy is functioning well and she is tolerating a diet with nutritional supplementation. Continues to work with therapies at [facility].   Vital signs: BP 96/67   Pulse (!) 103   Temp 98.1 F (36.7 C) (Oral)   Resp 14   Ht 5\' 5"  (1.651 m)   Wt 150 lb (68 kg)   SpO2 98%   BMI 24.96 kg/m    Physical Exam: Constitutional: Well appearing female, somewhat deconditioned, NAD Abdomen: Soft, incisional soreness, non-distended, no rebound/gurading. Ileostomy in RLQ is pink, patent, with gas and semi-solid stool in bag.  Skin: The inferior 4 cm to the midline laparotomy wound is healing via secondary intention due to infection, the wound bed is almost 100% granulation tissue, no evidence of fluctuance or purulence. The remaining portion of the laparotomy wound is CDI with staples.   Assessment/Plan: This is a 74 y.o. 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 She ill continue this BID + prn at SNF  -  Continue ABx (Augmentin); follow up Cx results from 10/24  - Continue diet as tolerates + nutritional supplementation  - Pain control prn  - Monitor ileostomy output  - Maintain hydration  - Continue mobilizing with PT  - Follow up in 2 weeks  -- 11/24, PA-C [facility] 03/27/2020, 10:19 AM [phone] M-F: 7am - 4pm

## 2020-03-28 ENCOUNTER — Emergency Department: Payer: Medicare Other

## 2020-03-28 ENCOUNTER — Inpatient Hospital Stay
Admission: EM | Admit: 2020-03-28 | Discharge: 2020-04-03 | DRG: 862 | Disposition: A | Discharge status: Skilled Nursing Facility | Payer: Medicare Other | Source: Skilled Nursing Facility | Attending: Internal Medicine | Admitting: Internal Medicine

## 2020-03-28 ENCOUNTER — Encounter: Payer: Self-pay | Admitting: Radiology

## 2020-03-28 ENCOUNTER — Other Ambulatory Visit: Payer: Self-pay

## 2020-03-28 DIAGNOSIS — R0902 Hypoxemia: Secondary | ICD-10-CM | POA: Diagnosis not present

## 2020-03-28 DIAGNOSIS — Y838 Other surgical procedures as the cause of abnormal reaction of the patient, or of later complication, without mention of misadventure at the time of the procedure: Secondary | ICD-10-CM | POA: Diagnosis present

## 2020-03-28 DIAGNOSIS — Z803 Family history of malignant neoplasm of breast: Secondary | ICD-10-CM

## 2020-03-28 DIAGNOSIS — I5032 Chronic diastolic (congestive) heart failure: Secondary | ICD-10-CM | POA: Diagnosis present

## 2020-03-28 DIAGNOSIS — Z8042 Family history of malignant neoplasm of prostate: Secondary | ICD-10-CM

## 2020-03-28 DIAGNOSIS — R6520 Severe sepsis without septic shock: Secondary | ICD-10-CM | POA: Diagnosis present

## 2020-03-28 DIAGNOSIS — B9620 Unspecified Escherichia coli [E. coli] as the cause of diseases classified elsewhere: Secondary | ICD-10-CM | POA: Diagnosis present

## 2020-03-28 DIAGNOSIS — G4734 Idiopathic sleep related nonobstructive alveolar hypoventilation: Secondary | ICD-10-CM

## 2020-03-28 DIAGNOSIS — I482 Chronic atrial fibrillation, unspecified: Secondary | ICD-10-CM

## 2020-03-28 DIAGNOSIS — I5033 Acute on chronic diastolic (congestive) heart failure: Secondary | ICD-10-CM | POA: Diagnosis present

## 2020-03-28 DIAGNOSIS — Z20822 Contact with and (suspected) exposure to covid-19: Secondary | ICD-10-CM | POA: Diagnosis present

## 2020-03-28 DIAGNOSIS — B952 Enterococcus as the cause of diseases classified elsewhere: Secondary | ICD-10-CM | POA: Diagnosis present

## 2020-03-28 DIAGNOSIS — Z79890 Hormone replacement therapy (postmenopausal): Secondary | ICD-10-CM

## 2020-03-28 DIAGNOSIS — Z79899 Other long term (current) drug therapy: Secondary | ICD-10-CM

## 2020-03-28 DIAGNOSIS — Z7901 Long term (current) use of anticoagulants: Secondary | ICD-10-CM

## 2020-03-28 DIAGNOSIS — I9589 Other hypotension: Secondary | ICD-10-CM | POA: Diagnosis present

## 2020-03-28 DIAGNOSIS — I110 Hypertensive heart disease with heart failure: Secondary | ICD-10-CM | POA: Diagnosis present

## 2020-03-28 DIAGNOSIS — R531 Weakness: Secondary | ICD-10-CM

## 2020-03-28 DIAGNOSIS — A4151 Sepsis due to Escherichia coli [E. coli]: Secondary | ICD-10-CM | POA: Diagnosis present

## 2020-03-28 DIAGNOSIS — R079 Chest pain, unspecified: Secondary | ICD-10-CM

## 2020-03-28 DIAGNOSIS — E871 Hypo-osmolality and hyponatremia: Secondary | ICD-10-CM | POA: Diagnosis present

## 2020-03-28 DIAGNOSIS — I10 Essential (primary) hypertension: Secondary | ICD-10-CM | POA: Diagnosis present

## 2020-03-28 DIAGNOSIS — Z9049 Acquired absence of other specified parts of digestive tract: Secondary | ICD-10-CM

## 2020-03-28 DIAGNOSIS — E039 Hypothyroidism, unspecified: Secondary | ICD-10-CM | POA: Diagnosis present

## 2020-03-28 DIAGNOSIS — T8149XA Infection following a procedure, other surgical site, initial encounter: Principal | ICD-10-CM

## 2020-03-28 DIAGNOSIS — L7634 Postprocedural seroma of skin and subcutaneous tissue following other procedure: Secondary | ICD-10-CM | POA: Diagnosis present

## 2020-03-28 DIAGNOSIS — Z932 Ileostomy status: Secondary | ICD-10-CM

## 2020-03-28 DIAGNOSIS — I4891 Unspecified atrial fibrillation: Secondary | ICD-10-CM

## 2020-03-28 DIAGNOSIS — Z8673 Personal history of transient ischemic attack (TIA), and cerebral infarction without residual deficits: Secondary | ICD-10-CM

## 2020-03-28 DIAGNOSIS — I959 Hypotension, unspecified: Secondary | ICD-10-CM

## 2020-03-28 DIAGNOSIS — Z9889 Other specified postprocedural states: Secondary | ICD-10-CM

## 2020-03-28 DIAGNOSIS — A4181 Sepsis due to Enterococcus: Secondary | ICD-10-CM | POA: Diagnosis present

## 2020-03-28 DIAGNOSIS — A419 Sepsis, unspecified organism: Principal | ICD-10-CM

## 2020-03-28 LAB — CBC WITH DIFFERENTIAL/PLATELET
Abs Immature Granulocytes: 0.28 10*3/uL — ABNORMAL HIGH (ref 0.00–0.07)
Basophils Absolute: 0.1 10*3/uL (ref 0.0–0.1)
Basophils Relative: 0 %
Eosinophils Absolute: 0.1 10*3/uL (ref 0.0–0.5)
Eosinophils Relative: 0 %
HCT: 33.7 % — ABNORMAL LOW (ref 36.0–46.0)
Hemoglobin: 10.8 g/dL — ABNORMAL LOW (ref 12.0–15.0)
Immature Granulocytes: 2 %
Lymphocytes Relative: 6 %
Lymphs Abs: 1 10*3/uL (ref 0.7–4.0)
MCH: 26.7 pg (ref 26.0–34.0)
MCHC: 32 g/dL (ref 30.0–36.0)
MCV: 83.4 fL (ref 80.0–100.0)
Monocytes Absolute: 1.1 10*3/uL — ABNORMAL HIGH (ref 0.1–1.0)
Monocytes Relative: 6 %
Neutro Abs: 14.9 10*3/uL — ABNORMAL HIGH (ref 1.7–7.7)
Neutrophils Relative %: 86 %
Platelets: 359 10*3/uL (ref 150–400)
RBC: 4.04 MIL/uL (ref 3.87–5.11)
RDW: 17.2 % — ABNORMAL HIGH (ref 11.5–15.5)
WBC: 17.4 10*3/uL — ABNORMAL HIGH (ref 4.0–10.5)
nRBC: 0 % (ref 0.0–0.2)

## 2020-03-28 LAB — COMPREHENSIVE METABOLIC PANEL
ALT: 40 U/L (ref 0–44)
AST: 41 U/L (ref 15–41)
Albumin: 3.1 g/dL — ABNORMAL LOW (ref 3.5–5.0)
Alkaline Phosphatase: 112 U/L (ref 38–126)
Anion gap: 13 (ref 5–15)
BUN: 13 mg/dL (ref 8–23)
CO2: 18 mmol/L — ABNORMAL LOW (ref 22–32)
Calcium: 8.4 mg/dL — ABNORMAL LOW (ref 8.9–10.3)
Chloride: 98 mmol/L (ref 98–111)
Creatinine, Ser: 0.53 mg/dL (ref 0.44–1.00)
GFR, Estimated: >60 mL/min (ref >60)
Glucose, Bld: 84 mg/dL (ref 70–99)
Potassium: 4.3 mmol/L (ref 3.5–5.1)
Sodium: 129 mmol/L — ABNORMAL LOW (ref 135–145)
Total Bilirubin: 0.7 mg/dL (ref 0.3–1.2)
Total Protein: 6.9 g/dL (ref 6.5–8.1)

## 2020-03-28 LAB — APTT: aPTT: 35 seconds (ref 24–36)

## 2020-03-28 LAB — PROTIME-INR
INR: 1.4 — ABNORMAL HIGH (ref 0.8–1.2)
Prothrombin Time: 17 seconds — ABNORMAL HIGH (ref 11.4–15.2)

## 2020-03-28 LAB — LACTIC ACID, PLASMA: Lactic Acid, Venous: 2.3 mmol/L (ref 0.5–1.9)

## 2020-03-28 MED ORDER — LACTATED RINGERS IV SOLN: INTRAVENOUS | Status: DC

## 2020-03-28 MED ORDER — VANCOMYCIN HCL IN DEXTROSE 1-5 GM/200ML-% IV SOLN
1000.0000 mg | Freq: Once | INTRAVENOUS | Status: AC
  Administered 2020-03-28: 1000 mg via INTRAVENOUS
  Filled 2020-03-28: qty 200

## 2020-03-28 MED ORDER — SODIUM CHLORIDE 0.9 % IV SOLN
2.0000 g | Freq: Once | INTRAVENOUS | Status: AC
  Administered 2020-03-28: 2 g via INTRAVENOUS
  Filled 2020-03-28: qty 2

## 2020-03-28 MED ORDER — SODIUM CHLORIDE 0.9 % IV SOLN: Freq: Once | INTRAVENOUS | Status: AC

## 2020-03-28 MED ORDER — ACETAMINOPHEN 500 MG PO TABS
1000.0000 mg | ORAL_TABLET | Freq: Once | ORAL | Status: AC
  Administered 2020-03-28: 1000 mg via ORAL
  Filled 2020-03-28: qty 2

## 2020-03-28 MED ORDER — SODIUM CHLORIDE 0.9 % IV SOLN
500.0000 mg | Freq: Once | INTRAVENOUS | Status: AC
  Administered 2020-03-28: 500 mg via INTRAVENOUS
  Filled 2020-03-28: qty 500

## 2020-03-28 MED ORDER — LACTATED RINGERS IV BOLUS (SEPSIS): 1000.0000 mL | Freq: Once | INTRAVENOUS | Status: DC

## 2020-03-28 MED ORDER — IOHEXOL 350 MG/ML SOLN
100.0000 mL | Freq: Once | INTRAVENOUS | Status: AC | PRN
  Administered 2020-03-28: 100 mL via INTRAVENOUS

## 2020-03-28 NOTE — ED Provider Notes (Signed)
[facility] Emergency Department Provider Note   ____________________________________________   First MD Initiated Contact with Patient 03/28/20 2130     (approximate)  I have reviewed the triage vital signs and the nursing notes.   HISTORY  Chief Complaint Chest Pain    HPI [name] is a 74 y.o. female with possible history of hypertension, CHF, atrial fibrillation on Eliquis, and seizures who presents to the ED complaining of chest pain.  Patient reports 24 to 48 hours of sharp pain in her chest that is worse when she takes a deep breath.  She states the pain is constant, but she denies any fevers or cough.  Patient noted to be febrile in triage, but had not noticed any fevers at home.  She denies any vomiting, diarrhea, dysuria, or hematuria.  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  She states she has been doing well since then with improving abdominal pain, although she was seen for superficial abscess 3 days ago.  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        Past Medical History:  Diagnosis Date  . Acute respiratory failure (HCC)    Secondary to aspiration pneumonia   . Altered mental status    Secondary to viral herpes simplex virus encephalitis  . Anemia   . Atrial fibrillation (HCC)   . Bilateral pneumonia   . CHF (congestive heart failure) (HCC)   . Dysphasia   . Encephalitis   . Hyperglycemia   . Hypertension   . Hyponatremia   . IBS (irritable bowel syndrome)   . Seizures (HCC)   . Stroke [facility])     Patient Active Problem List   Diagnosis Date Noted  . Malnutrition of moderate degree 03/19/2020  . Hyponatremia 03/12/2020  . Chronic diastolic heart failure (HCC) 03/12/2020  . Large bowel obstruction (HCC) 03/12/2020  . Abdominal distention 03/11/2020  . Iron deficiency anemia 12/20/2019  . Acute on chronic diastolic CHF (congestive heart failure)  (HCC) 12/04/2019  . Atrial fibrillation (HCC)   . Hypertension   . Stroke (HCC)   . Hypothyroidism   . Acute respiratory failure with hypoxia (HCC)   . Acute congestive heart failure (HCC) 03/17/2019  . Acute gastroenteritis 07/05/2017  . Acute respiratory failure (HCC) 03/27/2011    Past Surgical History:  Procedure Laterality Date  . CHOLECYSTECTOMY    . COLECTOMY WITH COLOSTOMY CREATION/HARTMANN PROCEDURE N/A 03/12/2020   Procedure: COLECTOMY WITH COLOSTOMY CREATION/HARTMANN PROCEDURE;  Surgeon: Campbell Lerner, MD;  Location: [facility] ORS;  Service: General;  Laterality: N/A;  . COLONOSCOPY N/A 01/17/2020   Procedure: COLONOSCOPY;  Surgeon: Regis Bill, MD;  Location: [facility] ENDOSCOPY;  Service: Endoscopy;  Laterality: N/A;  . COLONOSCOPY WITH PROPOFOL N/A 12/31/2016   Procedure: COLONOSCOPY WITH PROPOFOL;  Surgeon: Scot Jun, MD;  Location: [facility] ENDOSCOPY;  Service: Endoscopy;  Laterality: N/A;  . ESOPHAGOGASTRODUODENOSCOPY (EGD) WITH PROPOFOL N/A 12/31/2016   Procedure: ESOPHAGOGASTRODUODENOSCOPY (EGD) WITH PROPOFOL;  Surgeon: Scot Jun, MD;  Location: [facility] ENDOSCOPY;  Service: Endoscopy;  Laterality: N/A;  . LAPAROTOMY N/A 03/12/2020   Procedure: EXPLORATORY LAPAROTOMY;  Surgeon: Campbell Lerner, MD;  Location: [facility] ORS;  Service: General;  Laterality: N/A;  . TONSILLECTOMY    . TUBAL LIGATION      Prior to Admission medications   Medication Sig Start Date End Date Taking? Authorizing Provider  acetaminophen (TYLENOL) 325 MG tablet Take 650 mg  by mouth every 6 (six) hours as needed.    [provider]  amiodarone (PACERONE) 200 MG tablet Take 1 tablet (200 mg total) by mouth daily. 03/20/20   Darlin Priestly, MD  amoxicillin-clavulanate (AUGMENTIN) 875-125 MG tablet Take 1 tablet by mouth 2 (two) times daily for 10 days. 03/25/20 04/04/20  Delton Prairie, MD  apixaban (ELIQUIS) 5 MG TABS tablet Take 1 tablet (5 mg total) by mouth 2 (two) times daily. 03/20/19    Altamese Dilling, MD  cephALEXin (KEFLEX) 500 MG capsule Take 500 mg by mouth 4 (four) times daily.    [provider]  dicyclomine (BENTYL) 10 MG capsule Take 10 mg by mouth every 6 (six) hours as needed. 12/12/19   [provider]  diltiazem (CARDIZEM CD) 240 MG 24 hr capsule Take 1 capsule (240 mg total) by mouth daily. 03/21/19   Altamese Dilling, MD  Ensure Max Protein (ENSURE MAX PROTEIN) LIQD Take 330 mLs (11 oz total) by mouth 2 (two) times daily. 03/20/20   Darlin Priestly, MD  folic acid (FOLVITE) 1 MG tablet Take 1 mg by mouth daily. Per tube once daily      [provider]  furosemide (LASIX) 20 MG tablet Hold until followup with outpatient doctor, since your blood pressure is low, and you are already loosing lots fluids in your ostomy. 03/20/20   Darlin Priestly, MD  guaiFENesin (MUCINEX) 600 MG 12 hr tablet Take 1 tablet (600 mg total) by mouth 2 (two) times daily. 03/20/20   Darlin Priestly, MD  hydrocortisone (ANUSOL-HC) 2.5 % rectal cream Apply 1 application topically 2 (two) times daily as needed. 02/28/20   [provider]  levothyroxine (SYNTHROID) 75 MCG tablet Take 75 mcg by mouth daily before breakfast.     [provider]  metoprolol succinate (TOPROL-XL) 25 MG 24 hr tablet Take 2 tablets (50 mg total) by mouth daily. 03/20/20   Darlin Priestly, MD  Multiple Vitamin (MULTIVITAMIN WITH MINERALS) TABS tablet Take 1 tablet by mouth daily. 03/20/20   Darlin Priestly, MD  omeprazole (PRILOSEC) 20 MG capsule Take 20 mg by mouth daily.    [provider]  polycarbophil (FIBERCON) 625 MG tablet Take 1 tablet (625 mg total) by mouth 3 (three) times daily. 03/20/20   Darlin Priestly, MD  potassium chloride (KLOR-CON) 10 MEQ tablet Take 10 mEq by mouth daily.    [provider]  traMADol (ULTRAM) 50 MG tablet Take 50 mg by mouth every 6 (six) hours as needed.    [provider]    Allergies Prednisone and Predicort [prednisolone  acetate]  Family History  Problem Relation Age of Onset  . Prostate cancer Father   . Breast cancer Maternal Aunt   . Breast cancer Maternal Aunt   . Cerebral palsy Son     Social History Social History   Tobacco Use  . Smoking status: Never Smoker  . Smokeless tobacco: Never Used  Vaping Use  . Vaping Use: Never used  Substance Use Topics  . Alcohol use: No  . Drug use: No    Review of Systems  Constitutional: Positive for fever/chills Eyes: No visual changes. ENT: No sore throat. Cardiovascular: Positive for chest pain. Respiratory: Positive for shortness of breath. Gastrointestinal: No abdominal pain.  No nausea, no vomiting.  No diarrhea.  No constipation. Genitourinary: Negative for dysuria. Musculoskeletal: Negative for back pain. Skin: Negative for rash. Neurological: Negative for headaches, focal weakness or numbness.  ____________________________________________   PHYSICAL EXAM:  VITAL SIGNS: ED Triage Vitals [03/28/20 2127]  Enc Vitals Group     BP      Pulse Rate (!) 106     Resp      Temp (!) 100.5 F (38.1 C)     Temp Source Oral     SpO2 99 %     Weight      Height      Head Circumference      Peak Flow      Pain Score      Pain Loc      Pain Edu?      Excl. in GC?     Constitutional: Alert and oriented. Eyes: Conjunctivae are normal. Head: Atraumatic. Nose: No congestion/rhinnorhea. Mouth/Throat: Mucous membranes are moist. Neck: Normal ROM Cardiovascular: Tachycardic, irregularly irregular rhythm. Grossly normal heart sounds. Respiratory: Normal respiratory effort.  No retractions. Lungs CTAB. Gastrointestinal: Soft and mildly tender to palpation diffusely.  Midline surgical scar clean, dry, and intact with no erythema, warmth, or drainage.  Ileostomy with brown stool. No distention. Genitourinary: deferred Musculoskeletal: No lower extremity tenderness nor edema. Neurologic:  Normal speech and language. No gross focal neurologic  deficits are appreciated. Skin:  Skin is warm, dry and intact. No rash noted. Psychiatric: Mood and affect are normal. Speech and behavior are normal.  ____________________________________________   LABS (all labs ordered are listed, but only abnormal results are displayed)  Labs Reviewed  LACTIC ACID, PLASMA - Abnormal; Notable for the following components:      Result Value   Lactic Acid, Venous 2.3 (*)    All other components within normal limits  COMPREHENSIVE METABOLIC PANEL - Abnormal; Notable for the following components:   Sodium 129 (*)    CO2 18 (*)    Calcium 8.4 (*)    Albumin 3.1 (*)    All other components within normal limits  CBC WITH DIFFERENTIAL/PLATELET - Abnormal; Notable for the following components:   WBC 17.4 (*)    Hemoglobin 10.8 (*)    HCT 33.7 (*)    RDW 17.2 (*)    Neutro Abs 14.9 (*)    Monocytes Absolute 1.1 (*)    Abs Immature Granulocytes 0.28 (*)    All other components within normal limits  URINE CULTURE  CULTURE, BLOOD (ROUTINE X 2)  CULTURE, BLOOD (ROUTINE X 2)  RESPIRATORY PANEL BY RT PCR (FLU A&B, COVID)  LACTIC ACID, PLASMA  URINALYSIS, COMPLETE (UACMP) WITH MICROSCOPIC  PROCALCITONIN  PROCALCITONIN  PROTIME-INR  APTT   ____________________________________________  EKG  ED ECG REPORT I, Chesley Noon, the attending physician, personally viewed and interpreted this ECG.   Date: 03/28/2020  EKG Time: 21:32  Rate: 87  Rhythm: atrial fibrillation, rate 87  Axis: Normal  Intervals:none  ST&T Change: None   PROCEDURES  Procedure(s) performed (including Critical Care):  .Critical Care Performed by: Chesley Noon, MD Authorized by: Chesley Noon, MD   Critical care provider statement:    Critical care time (minutes):  45   Critical care time was exclusive of:  Separately billable procedures and treating other patients and teaching time   Critical care was necessary to treat or prevent imminent or life-threatening  deterioration of the following conditions:  Sepsis   Critical care was time spent personally by me on the following activities:  Discussions with consultants, evaluation of patient's response to treatment, examination of patient, ordering and performing treatments and interventions, ordering and review of laboratory studies, ordering and review of radiographic studies, pulse oximetry,  re-evaluation of patient's condition, obtaining history from patient or surrogate and review of old charts   I assumed direction of critical care for this patient from another provider in my specialty: no       ____________________________________________   INITIAL IMPRESSION / ASSESSMENT AND PLAN / ED COURSE       74 year old female with past medical history of hypertension, CHF, atrial fibrillation on Eliquis, seizures, and recent subtotal colectomy with ileostomy who presents to the ED with pleuritic chest pain for the past 1 to 2 days.  On arrival, she is febrile and tachycardic, presentation concerning for sepsis.  We will start IV fluid resuscitation and broad-spectrum antibiotics with coverage for atypical pneumonia given her symptoms are primary pulmonary.  Chest x-ray reviewed by me and does show faint right-sided opacity that could represent early pneumonia. Given her recent surgery and long hospital stay, we will also rule out PE with CTA chest.  Also plan to check CT abdomen/pelvis with her recent superficial abscess, although surgical site does not appear infected on exam.  Patient turned over to oncoming provider pending lab and imaging results, anticipate admission for sepsis versus PE.      ____________________________________________   FINAL CLINICAL IMPRESSION(S) / ED DIAGNOSES  Final diagnoses:  Sepsis, due to unspecified organism, unspecified whether acute organ dysfunction present [facility](HCC)  Nonspecific chest pain     ED Discharge Orders    None       Note:  This document was  prepared using Dragon voice recognition software and may include unintentional dictation errors.   Chesley NoonJessup, Deagan Sevin, MD 03/28/20 [phone]

## 2020-03-28 NOTE — Progress Notes (Signed)
CODE SEPSIS - PHARMACY COMMUNICATION  **Broad Spectrum Antibiotics should be administered within 1 hour of Sepsis diagnosis**  Time Code Sepsis Called/Page Received: 1055  Antibiotics Ordered: vancomycin/cefepime/azithromycin  Time of 1st antibiotic administration: 2309  Additional action taken by pharmacy: NA  If necessary, Name of Provider/Nurse Contacted: NA    Pricilla Riffle ,PharmD Clinical Pharmacist  03/28/2020  11:10 PM

## 2020-03-28 NOTE — Progress Notes (Signed)
PHARMACY -  BRIEF ANTIBIOTIC NOTE   Pharmacy has received consult(s) for vancomycin and cefepime from an ED provider.  The patient's profile has been reviewed for ht/wt/allergies/indication/available labs.    One time order(s) placed for vancomycin 1 g + cefepime 2 g  Further antibiotics/pharmacy consults should be ordered by admitting physician if indicated.                       Thank you,  Pricilla Riffle, PharmD 03/28/2020  11:06 PM

## 2020-03-28 NOTE — ED Triage Notes (Signed)
Pt to ED via [facility] from [facility] for sharp 5/10 cp started this AM. Accompanied with SOB. Hx of bowel obstruction with surgery x2 weeks, +infection with finished abx. Staples to abdomen removed yesterday. At 2030 had 5mg  Elaquis, Mylanta, and 50mg  of Tramadol. AO x4. Talking in full sentences with regular and unlabored breathing.

## 2020-03-29 DIAGNOSIS — Z932 Ileostomy status: Secondary | ICD-10-CM | POA: Diagnosis not present

## 2020-03-29 DIAGNOSIS — Z79899 Other long term (current) drug therapy: Secondary | ICD-10-CM | POA: Diagnosis not present

## 2020-03-29 DIAGNOSIS — T8149XA Infection following a procedure, other surgical site, initial encounter: Secondary | ICD-10-CM | POA: Diagnosis present

## 2020-03-29 DIAGNOSIS — I5033 Acute on chronic diastolic (congestive) heart failure: Secondary | ICD-10-CM | POA: Diagnosis present

## 2020-03-29 DIAGNOSIS — E871 Hypo-osmolality and hyponatremia: Secondary | ICD-10-CM | POA: Diagnosis not present

## 2020-03-29 DIAGNOSIS — Z803 Family history of malignant neoplasm of breast: Secondary | ICD-10-CM | POA: Diagnosis not present

## 2020-03-29 DIAGNOSIS — Z9049 Acquired absence of other specified parts of digestive tract: Secondary | ICD-10-CM | POA: Diagnosis not present

## 2020-03-29 DIAGNOSIS — I5032 Chronic diastolic (congestive) heart failure: Secondary | ICD-10-CM | POA: Diagnosis not present

## 2020-03-29 DIAGNOSIS — I482 Chronic atrial fibrillation, unspecified: Secondary | ICD-10-CM | POA: Diagnosis not present

## 2020-03-29 DIAGNOSIS — Z7901 Long term (current) use of anticoagulants: Secondary | ICD-10-CM | POA: Diagnosis not present

## 2020-03-29 DIAGNOSIS — Y838 Other surgical procedures as the cause of abnormal reaction of the patient, or of later complication, without mention of misadventure at the time of the procedure: Secondary | ICD-10-CM | POA: Diagnosis present

## 2020-03-29 DIAGNOSIS — Z8042 Family history of malignant neoplasm of prostate: Secondary | ICD-10-CM | POA: Diagnosis not present

## 2020-03-29 DIAGNOSIS — Z79890 Hormone replacement therapy (postmenopausal): Secondary | ICD-10-CM | POA: Diagnosis not present

## 2020-03-29 DIAGNOSIS — E039 Hypothyroidism, unspecified: Secondary | ICD-10-CM | POA: Diagnosis present

## 2020-03-29 DIAGNOSIS — I959 Hypotension, unspecified: Secondary | ICD-10-CM | POA: Diagnosis not present

## 2020-03-29 DIAGNOSIS — A4151 Sepsis due to Escherichia coli [E. coli]: Secondary | ICD-10-CM | POA: Diagnosis present

## 2020-03-29 DIAGNOSIS — I110 Hypertensive heart disease with heart failure: Secondary | ICD-10-CM | POA: Diagnosis present

## 2020-03-29 DIAGNOSIS — R0902 Hypoxemia: Secondary | ICD-10-CM | POA: Diagnosis not present

## 2020-03-29 DIAGNOSIS — B9620 Unspecified Escherichia coli [E. coli] as the cause of diseases classified elsewhere: Secondary | ICD-10-CM | POA: Diagnosis present

## 2020-03-29 DIAGNOSIS — L7634 Postprocedural seroma of skin and subcutaneous tissue following other procedure: Secondary | ICD-10-CM | POA: Diagnosis present

## 2020-03-29 DIAGNOSIS — I9589 Other hypotension: Secondary | ICD-10-CM | POA: Diagnosis present

## 2020-03-29 DIAGNOSIS — Z20822 Contact with and (suspected) exposure to covid-19: Secondary | ICD-10-CM | POA: Diagnosis present

## 2020-03-29 DIAGNOSIS — A419 Sepsis, unspecified organism: Secondary | ICD-10-CM

## 2020-03-29 DIAGNOSIS — Z9889 Other specified postprocedural states: Secondary | ICD-10-CM

## 2020-03-29 DIAGNOSIS — R6520 Severe sepsis without septic shock: Secondary | ICD-10-CM

## 2020-03-29 DIAGNOSIS — Z8673 Personal history of transient ischemic attack (TIA), and cerebral infarction without residual deficits: Secondary | ICD-10-CM | POA: Diagnosis not present

## 2020-03-29 DIAGNOSIS — A4181 Sepsis due to Enterococcus: Secondary | ICD-10-CM | POA: Diagnosis present

## 2020-03-29 DIAGNOSIS — B952 Enterococcus as the cause of diseases classified elsewhere: Secondary | ICD-10-CM | POA: Diagnosis present

## 2020-03-29 DIAGNOSIS — I4891 Unspecified atrial fibrillation: Secondary | ICD-10-CM | POA: Diagnosis not present

## 2020-03-29 LAB — URINALYSIS, COMPLETE (UACMP) WITH MICROSCOPIC
Bacteria, UA: NONE SEEN
Bilirubin Urine: NEGATIVE
Glucose, UA: NEGATIVE mg/dL
Hgb urine dipstick: NEGATIVE
Ketones, ur: NEGATIVE mg/dL
Leukocytes,Ua: NEGATIVE
Nitrite: NEGATIVE
Protein, ur: NEGATIVE mg/dL
Specific Gravity, Urine: 1.018 (ref 1.005–1.030)
WBC, UA: NONE SEEN WBC/hpf (ref 0–5)
pH: 5 (ref 5.0–8.0)

## 2020-03-29 LAB — GASTROINTESTINAL PANEL BY PCR, STOOL (REPLACES STOOL CULTURE)

## 2020-03-29 LAB — BASIC METABOLIC PANEL
Anion gap: 8 (ref 5–15)
BUN: 9 mg/dL (ref 8–23)
CO2: 18 mmol/L — ABNORMAL LOW (ref 22–32)
Calcium: 7.7 mg/dL — ABNORMAL LOW (ref 8.9–10.3)
Chloride: 107 mmol/L (ref 98–111)
Creatinine, Ser: 0.48 mg/dL (ref 0.44–1.00)
GFR, Estimated: >60 mL/min (ref >60)
Glucose, Bld: 99 mg/dL (ref 70–99)
Potassium: 3.7 mmol/L (ref 3.5–5.1)
Sodium: 133 mmol/L — ABNORMAL LOW (ref 135–145)

## 2020-03-29 LAB — AEROBIC/ANAEROBIC CULTURE W GRAM STAIN (SURGICAL/DEEP WOUND)

## 2020-03-29 LAB — RESPIRATORY PANEL BY RT PCR (FLU A&B, COVID)
Influenza A by PCR: NEGATIVE
Influenza B by PCR: NEGATIVE
SARS Coronavirus 2 by RT PCR: NEGATIVE

## 2020-03-29 LAB — CBC
HCT: 29.3 % — ABNORMAL LOW (ref 36.0–46.0)
Hemoglobin: 9.3 g/dL — ABNORMAL LOW (ref 12.0–15.0)
MCH: 27.4 pg (ref 26.0–34.0)
MCHC: 31.7 g/dL (ref 30.0–36.0)
MCV: 86.2 fL (ref 80.0–100.0)
Platelets: 271 10*3/uL (ref 150–400)
RBC: 3.4 MIL/uL — ABNORMAL LOW (ref 3.87–5.11)
RDW: 17.2 % — ABNORMAL HIGH (ref 11.5–15.5)
WBC: 16.5 10*3/uL — ABNORMAL HIGH (ref 4.0–10.5)
nRBC: 0 % (ref 0.0–0.2)

## 2020-03-29 LAB — C DIFFICILE QUICK SCREEN W PCR REFLEX
C Diff antigen: NEGATIVE
C Diff interpretation: NOT DETECTED
C Diff toxin: NEGATIVE

## 2020-03-29 LAB — TROPONIN I (HIGH SENSITIVITY)
Troponin I (High Sensitivity): 7 ng/L (ref <18)
Troponin I (High Sensitivity): 6 ng/L (ref <18)

## 2020-03-29 LAB — PROCALCITONIN
Procalcitonin: <0.1 ng/mL
Procalcitonin: <0.1 ng/mL

## 2020-03-29 LAB — PROTIME-INR
INR: 1.4 — ABNORMAL HIGH (ref 0.8–1.2)
Prothrombin Time: 16.6 seconds — ABNORMAL HIGH (ref 11.4–15.2)

## 2020-03-29 LAB — CORTISOL-AM, BLOOD: Cortisol - AM: 11.7 ug/dL (ref 6.7–22.6)

## 2020-03-29 LAB — GLUCOSE, CAPILLARY: Glucose-Capillary: 84 mg/dL (ref 70–99)

## 2020-03-29 LAB — LACTIC ACID, PLASMA: Lactic Acid, Venous: 2 mmol/L (ref 0.5–1.9)

## 2020-03-29 LAB — MRSA PCR SCREENING: MRSA by PCR: NEGATIVE

## 2020-03-29 MED ORDER — LACTATED RINGERS IV BOLUS (SEPSIS): 250.0000 mL | Freq: Once | INTRAVENOUS | Status: DC

## 2020-03-29 MED ORDER — APIXABAN 5 MG PO TABS
5.0000 mg | ORAL_TABLET | Freq: Two times a day (BID) | ORAL | Status: DC
  Administered 2020-03-30 – 2020-04-03 (×9): 5 mg via ORAL
  Filled 2020-03-29 (×10): qty 1

## 2020-03-29 MED ORDER — SODIUM CHLORIDE 0.9 % IV SOLN
3.0000 g | Freq: Four times a day (QID) | INTRAVENOUS | Status: DC
  Administered 2020-03-29 – 2020-04-02 (×15): 3 g via INTRAVENOUS
  Filled 2020-03-29 (×4): qty 8
  Filled 2020-03-29: qty 3
  Filled 2020-03-29: qty 8
  Filled 2020-03-29: qty 3
  Filled 2020-03-29: qty 8
  Filled 2020-03-29 (×2): qty 3
  Filled 2020-03-29: qty 8
  Filled 2020-03-29 (×2): qty 3
  Filled 2020-03-29: qty 8
  Filled 2020-03-29 (×2): qty 3
  Filled 2020-03-29: qty 8
  Filled 2020-03-29: qty 3

## 2020-03-29 MED ORDER — SODIUM CHLORIDE 0.9 % IV SOLN: Freq: Once | INTRAVENOUS | Status: AC

## 2020-03-29 MED ORDER — LACTATED RINGERS IV BOLUS (SEPSIS): 1000.0000 mL | Freq: Once | INTRAVENOUS | Status: DC

## 2020-03-29 MED ORDER — AMIODARONE HCL 200 MG PO TABS
200.0000 mg | ORAL_TABLET | Freq: Every day | ORAL | Status: DC
  Administered 2020-03-29 – 2020-04-03 (×6): 200 mg via ORAL
  Filled 2020-03-29 (×6): qty 1

## 2020-03-29 MED ORDER — VANCOMYCIN HCL IN DEXTROSE 1-5 GM/200ML-% IV SOLN: 1000.0000 mg | Freq: Once | INTRAVENOUS | Status: DC

## 2020-03-29 MED ORDER — SODIUM CHLORIDE 0.9 % IV BOLUS: 1000.0000 mL | Freq: Once | INTRAVENOUS | Status: DC

## 2020-03-29 MED ORDER — SODIUM CHLORIDE 0.9 % IV SOLN
2.0000 g | Freq: Two times a day (BID) | INTRAVENOUS | Status: DC
  Administered 2020-03-29: 2 g via INTRAVENOUS
  Filled 2020-03-29 (×3): qty 2

## 2020-03-29 MED ORDER — CHLORHEXIDINE GLUCONATE CLOTH 2 % EX PADS
6.0000 | MEDICATED_PAD | Freq: Every day | CUTANEOUS | Status: DC
  Administered 2020-03-30 – 2020-04-03 (×5): 6 via TOPICAL

## 2020-03-29 MED ORDER — ACETAMINOPHEN 650 MG RE SUPP: 650.0000 mg | Freq: Four times a day (QID) | RECTAL | Status: DC | PRN

## 2020-03-29 MED ORDER — VANCOMYCIN HCL 1250 MG/250ML IV SOLN
1250.0000 mg | INTRAVENOUS | Status: DC
  Administered 2020-03-29: 1250 mg via INTRAVENOUS
  Filled 2020-03-29: qty 250

## 2020-03-29 MED ORDER — ENOXAPARIN SODIUM 40 MG/0.4ML ~~LOC~~ SOLN
40.0000 mg | SUBCUTANEOUS | Status: DC
  Administered 2020-03-29: 40 mg via SUBCUTANEOUS
  Filled 2020-03-29: qty 0.4

## 2020-03-29 MED ORDER — ACETAMINOPHEN 325 MG PO TABS: 650.0000 mg | ORAL_TABLET | Freq: Four times a day (QID) | ORAL | Status: DC | PRN

## 2020-03-29 MED ORDER — MIDODRINE HCL 5 MG PO TABS
5.0000 mg | ORAL_TABLET | Freq: Three times a day (TID) | ORAL | Status: DC
  Administered 2020-03-29 – 2020-04-01 (×8): 5 mg via ORAL
  Filled 2020-03-29 (×8): qty 1

## 2020-03-29 MED ORDER — ONDANSETRON HCL 4 MG PO TABS: 4.0000 mg | ORAL_TABLET | Freq: Four times a day (QID) | ORAL | Status: DC | PRN

## 2020-03-29 MED ORDER — ONDANSETRON HCL 4 MG/2ML IJ SOLN
4.0000 mg | Freq: Four times a day (QID) | INTRAMUSCULAR | Status: DC | PRN
  Administered 2020-03-31: 4 mg via INTRAVENOUS
  Filled 2020-03-29: qty 2

## 2020-03-29 MED ORDER — METRONIDAZOLE IN NACL 5-0.79 MG/ML-% IV SOLN
500.0000 mg | Freq: Three times a day (TID) | INTRAVENOUS | Status: DC
  Administered 2020-03-29: 500 mg via INTRAVENOUS
  Filled 2020-03-29 (×2): qty 100

## 2020-03-29 MED ORDER — HYDROCODONE-ACETAMINOPHEN 5-325 MG PO TABS
1.0000 | ORAL_TABLET | ORAL | Status: DC | PRN
  Administered 2020-03-30 (×2): 1 via ORAL
  Administered 2020-03-31: 2 via ORAL
  Administered 2020-03-31 – 2020-04-03 (×5): 1 via ORAL
  Filled 2020-03-29: qty 2
  Filled 2020-03-29 (×7): qty 1

## 2020-03-29 MED ORDER — LACTATED RINGERS IV SOLN: INTRAVENOUS | Status: DC

## 2020-03-29 NOTE — Progress Notes (Signed)
Pt well known to me, presenting with CP w/o clear etiology.  I have reviewed CT imaging and labs, and see no clear cut post-op complication to explain pt's Sx, or current septic picture.   Appreciate medical admission, and work-up, and will personally evaluate from surgical perspective later in the day.

## 2020-03-29 NOTE — Progress Notes (Signed)
Patient ID: [name], female   DOB: 1945-06-10, 74 y.o.   MRN: [id number] [facility] PROGRESS NOTE  TATA TIMMINS BJY:[id number] DOB: 1946/05/27 DOA: 03/28/2020 PCP: Mickel Fuchs, MD  HPI/Subjective: Patient came in with chest pain and shortness of breath. She also had a low-grade fever and was hypotensive. Patient was admitted to the stepdown unit and started on antibiotics. Patient has some drainage from her recent abdominal surgery.  Objective: Vitals:   03/29/20 1200 03/29/20 1300  BP: (!) 89/81 90/61  Pulse: (!) 104 (!) 104  Resp: (!) 23 (!) 23  Temp: 97.8 F (36.6 C)   SpO2: 98% 99%    Intake/Output Summary (Last 24 hours) at 03/29/2020 1429 Last data filed at 03/29/2020 0900 Gross per 24 hour  Intake 1039.9 ml  Output 400 ml  Net 639.9 ml   Filed Weights   03/29/20 0550  Weight: 64.3 kg    ROS: Review of Systems  Respiratory: Negative for cough and shortness of breath.   Cardiovascular: Negative for chest pain.  Gastrointestinal: Positive for abdominal pain. Negative for nausea and vomiting.   Exam: Physical Exam HENT:     Head: Normocephalic.     Mouth/Throat:     Pharynx: No oropharyngeal exudate.  Eyes:     General: Lids are normal.     Conjunctiva/sclera: Conjunctivae normal.     Pupils: Pupils are equal, round, and reactive to light.  Cardiovascular:     Rate and Rhythm: Tachycardia present. Rhythm irregularly irregular.     Heart sounds: Normal heart sounds, S1 normal and S2 normal.  Pulmonary:     Breath sounds: Examination of the right-lower field reveals decreased breath sounds. Examination of the left-lower field reveals decreased breath sounds. Decreased breath sounds present. No wheezing, rhonchi or rales.  Abdominal:     Palpations: Abdomen is soft.     Tenderness: There is no abdominal tenderness.  Musculoskeletal:     Right lower leg: No swelling.     Left lower leg: No swelling.  Skin:    General: Skin is warm.      Findings: No rash.     Comments: Abdominal wound covered.  Neurological:     Mental Status: She is alert and oriented to person, place, and time.       Data Reviewed: Basic Metabolic Panel: Recent Labs  Lab 03/25/20 1701 03/28/20 2150 03/29/20 0431  NA 131* 129* 133*  K 4.6 4.3 3.7  CL 102 98 107  CO2 18* 18* 18*  GLUCOSE 100* 84 99  BUN CREATININE 0.74 0.53 0.48  CALCIUM 8.4* 8.4* 7.7*   Liver Function Tests: Recent Labs  Lab 03/28/20 2150  AST 41  ALT 40  ALKPHOS 112  BILITOT 0.7  PROT 6.9  ALBUMIN 3.1*   CBC: Recent Labs  Lab 03/25/20 1701 03/28/20 2150 03/29/20 0431  WBC 15.2* 17.4* 16.5*  NEUTROABS 12.0* 14.9*  --   HGB 11.0* 10.8* 9.3*  HCT 33.7* 33.7* 29.3*  MCV 81.8 83.4 86.2  PLT 395 359 271   BNP (last 3 results) Recent Labs    12/07/19 0751 03/13/20 0303 03/13/20 1718  BNP 686.2* 981.4* 419.8*     CBG: Recent Labs  Lab 03/29/20 0546  GLUCAP 84    Recent Results (from the past 240 hour(s))  Respiratory Panel by RT PCR (Flu A&B, Covid) - Nasopharyngeal Swab     Status: None   Collection Time: 03/20/20 11:59 AM  Specimen: Nasopharyngeal Swab  Result Value Ref Range Status   SARS Coronavirus 2 by RT PCR NEGATIVE NEGATIVE Final    Comment: (NOTE) SARS-CoV-2 target nucleic acids are NOT DETECTED.  The SARS-CoV-2 RNA is generally detectable in upper respiratoy specimens during the acute phase of infection. The lowest concentration of SARS-CoV-2 viral copies this assay can detect is 131 copies/mL. A negative result does not preclude SARS-Cov-2 infection and should not be used as the sole basis for treatment or other patient management decisions. A negative result may occur with  improper specimen collection/handling, submission of specimen other than nasopharyngeal swab, presence of viral mutation(s) within the areas targeted by this assay, and inadequate number of viral copies (<131 copies/mL). A negative result must be  combined with clinical observations, patient history, and epidemiological information. The expected result is Negative.  Fact Sheet for Patients:  https://www.moore.com/  Fact Sheet for Healthcare Providers:  https://www.young.biz/  This test is no t yet approved or cleared by the [place] FDA and  has been authorized for detection and/or diagnosis of SARS-CoV-2 by FDA under an Emergency Use Authorization (EUA). This EUA will remain  in effect (meaning this test can be used) for the duration of the COVID-19 declaration under Section 564(b)(1) of the Act, 21 U.S.C. section 360bbb-3(b)(1), unless the authorization is terminated or revoked sooner.     Influenza A by PCR NEGATIVE NEGATIVE Final   Influenza B by PCR NEGATIVE NEGATIVE Final    Comment: (NOTE) The Xpert Xpress SARS-CoV-2/FLU/RSV assay is intended as an aid in  the diagnosis of influenza from Nasopharyngeal swab specimens and  should not be used as a sole basis for treatment. Nasal washings and  aspirates are unacceptable for Xpert Xpress SARS-CoV-2/FLU/RSV  testing.  Fact Sheet for Patients: https://www.moore.com/  Fact Sheet for Healthcare Providers: https://www.young.biz/  This test is not yet approved or cleared by the [place] FDA and  has been authorized for detection and/or diagnosis of SARS-CoV-2 by  FDA under an Emergency Use Authorization (EUA). This EUA will remain  in effect (meaning this test can be used) for the duration of the  Covid-19 declaration under Section 564(b)(1) of the Act, 21  U.S.C. section 360bbb-3(b)(1), unless the authorization is  terminated or revoked. Performed at [facility], [address], [place], [place] [address]   Aerobic/Anaerobic Culture (surgical/deep wound)     Status: None   Collection Time: 03/25/20 10:52 PM   Specimen: Abscess; Wound  Result Value Ref Range Status    Specimen Description   Final    ABSCESS Performed at [facility], [address], [place], [place] [address]    Special Requests   Final    NONE Performed at [facility], [address] [place], [place] [address]    Gram Stain   Final    ABUNDANT WBC PRESENT, PREDOMINANTLY PMN MODERATE GRAM POSITIVE COCCI MODERATE GRAM NEGATIVE RODS MODERATE GRAM POSITIVE RODS    Culture   Final    RARE ESCHERICHIA COLI RARE ENTEROCOCCUS FAECIUM FEW BACTEROIDES FRAGILIS BETA LACTAMASE POSITIVE Performed at [facility], [address] [place], [place] [address]    Report Status 03/29/2020 FINAL  Final   Organism ID, Bacteria ESCHERICHIA COLI  Final   Organism ID, Bacteria ENTEROCOCCUS FAECIUM  Final      Susceptibility   Escherichia coli - MIC*    AMPICILLIN >=32 RESISTANT Resistant     CEFAZOLIN <=4 SENSITIVE Sensitive     CEFEPIME <=0.12  SENSITIVE Sensitive     CEFTAZIDIME <=1 SENSITIVE Sensitive     CEFTRIAXONE <=0.25 SENSITIVE Sensitive     CIPROFLOXACIN >=4 RESISTANT Resistant     GENTAMICIN <=1 SENSITIVE Sensitive     IMIPENEM <=0.25 SENSITIVE Sensitive     TRIMETH/SULFA >=320 RESISTANT Resistant     AMPICILLIN/SULBACTAM 8 SENSITIVE Sensitive     PIP/TAZO <=4 SENSITIVE Sensitive     * RARE ESCHERICHIA COLI   Enterococcus faecium - MIC*    AMPICILLIN <=2 SENSITIVE Sensitive     VANCOMYCIN <=0.5 SENSITIVE Sensitive     GENTAMICIN SYNERGY SENSITIVE Sensitive     * RARE ENTEROCOCCUS FAECIUM  Blood Culture (routine x 2)     Status: None (Preliminary result)   Collection Time: 03/28/20  9:50 PM   Specimen: BLOOD  Result Value Ref Range Status   Specimen Description BLOOD BLOOD RIGHT FOREARM  Final   Special Requests   Final    BOTTLES DRAWN AEROBIC AND ANAEROBIC Blood Culture results may not be optimal due to an inadequate volume of blood received in culture bottles   Culture   Final    NO GROWTH < 12 HOURS Performed at [facility], [address] [place], [place] [address]    Report Status PENDING  Incomplete  Blood Culture (routine x 2)     Status: None (Preliminary result)   Collection Time: 03/28/20  9:50 PM   Specimen: BLOOD  Result Value Ref Range Status   Specimen Description BLOOD BLOOD RIGHT FOREARM  Final   Special Requests   Final    BOTTLES DRAWN AEROBIC AND ANAEROBIC Blood Culture results may not be optimal due to an inadequate volume of blood received in culture bottles   Culture   Final    NO GROWTH < 12 HOURS Performed at [facility], [address], [place], [place] [address]    Report Status PENDING  Incomplete  Respiratory Panel by RT PCR (Flu A&B, Covid) - Nasopharyngeal Swab     Status: None   Collection Time: 03/28/20 11:59 PM   Specimen: Nasopharyngeal Swab  Result Value Ref Range Status   SARS Coronavirus 2 by RT PCR NEGATIVE NEGATIVE Final    Comment: (NOTE) SARS-CoV-2 target nucleic acids are NOT DETECTED.  The SARS-CoV-2 RNA is generally detectable in upper respiratoy specimens during the acute phase of infection. The lowest concentration of SARS-CoV-2 viral copies this assay can detect is 131 copies/mL. A negative result does not preclude SARS-Cov-2 infection and should not be used as the sole basis for treatment or other patient management decisions. A negative result may occur with  improper specimen collection/handling, submission of specimen other than nasopharyngeal swab, presence of viral mutation(s) within the areas targeted by this assay, and inadequate number of viral copies (<131 copies/mL). A negative result must be combined with clinical observations, patient history, and epidemiological information. The expected result is Negative.  Fact Sheet for Patients:  https://www.moore.com/  Fact Sheet for Healthcare Providers:  https://www.young.biz/  This test is no t yet approved or cleared by the [place] FDA and  has  been authorized for detection and/or diagnosis of SARS-CoV-2 by FDA under an Emergency Use Authorization (EUA). This EUA will remain  in effect (meaning this test can be used) for the duration of the COVID-19 declaration under Section 564(b)(1) of the Act, 21 U.S.C. section 360bbb-3(b)(1), unless the authorization is terminated or revoked sooner.     Influenza A by PCR NEGATIVE NEGATIVE Final  Influenza B by PCR NEGATIVE NEGATIVE Final    Comment: (NOTE) The Xpert Xpress SARS-CoV-2/FLU/RSV assay is intended as an aid in  the diagnosis of influenza from Nasopharyngeal swab specimens and  should not be used as a sole basis for treatment. Nasal washings and  aspirates are unacceptable for Xpert Xpress SARS-CoV-2/FLU/RSV  testing.  Fact Sheet for Patients: https://www.moore.com/  Fact Sheet for Healthcare Providers: https://www.young.biz/  This test is not yet approved or cleared by the [place] FDA and  has been authorized for detection and/or diagnosis of SARS-CoV-2 by  FDA under an Emergency Use Authorization (EUA). This EUA will remain  in effect (meaning this test can be used) for the duration of the  Covid-19 declaration under Section 564(b)(1) of the Act, 21  U.S.C. section 360bbb-3(b)(1), unless the authorization is  terminated or revoked. Performed at [facility], [address] [place], [place] [address]   MRSA PCR Screening     Status: None   Collection Time: 03/29/20  5:47 AM   Specimen: Nasopharyngeal  Result Value Ref Range Status   MRSA by PCR NEGATIVE NEGATIVE Final    Comment:        The GeneXpert MRSA Assay (FDA approved for NASAL specimens only), is one component of a comprehensive MRSA colonization surveillance program. It is not intended to diagnose MRSA infection nor to guide or monitor treatment for MRSA infections. Performed at [facility], [address] [place], [place]  [address]   Gastrointestinal Panel by PCR , Stool     Status: None   Collection Time: 03/29/20  7:52 AM   Specimen: Stool  Result Value Ref Range Status   Campylobacter species NOT DETECTED NOT DETECTED Final   Plesimonas shigelloides NOT DETECTED NOT DETECTED Final   Salmonella species NOT DETECTED NOT DETECTED Final   Yersinia enterocolitica NOT DETECTED NOT DETECTED Final   Vibrio species NOT DETECTED NOT DETECTED Final   Vibrio cholerae NOT DETECTED NOT DETECTED Final   Enteroaggregative E coli (EAEC) NOT DETECTED NOT DETECTED Final   Enteropathogenic E coli (EPEC) NOT DETECTED NOT DETECTED Final   Enterotoxigenic E coli (ETEC) NOT DETECTED NOT DETECTED Final   Shiga like toxin producing E coli (STEC) NOT DETECTED NOT DETECTED Final   Shigella/Enteroinvasive E coli (EIEC) NOT DETECTED NOT DETECTED Final   Cryptosporidium NOT DETECTED NOT DETECTED Final   Cyclospora cayetanensis NOT DETECTED NOT DETECTED Final   Entamoeba histolytica NOT DETECTED NOT DETECTED Final   Giardia lamblia NOT DETECTED NOT DETECTED Final   Adenovirus F40/41 NOT DETECTED NOT DETECTED Final   Astrovirus NOT DETECTED NOT DETECTED Final   Norovirus GI/GII NOT DETECTED NOT DETECTED Final   Rotavirus A NOT DETECTED NOT DETECTED Final   Sapovirus (I, II, IV, and V) NOT DETECTED NOT DETECTED Final    Comment: Performed at [facility], [address] [place], [place] [address]  C Difficile Quick Screen w PCR reflex     Status: None   Collection Time: 03/29/20  7:52 AM   Specimen: Stool  Result Value Ref Range Status   C Diff antigen NEGATIVE NEGATIVE Final   C Diff toxin NEGATIVE NEGATIVE Final   C Diff interpretation No C. difficile detected.  Final    Comment: Performed at [facility], [address] [place], [place] [address]     Studies: CT Angio Chest PE W/Cm &/Or Wo Cm  Result Date: 03/29/2020 CLINICAL DATA:  Chest pain with shortness of breath. History of  bowel obstruction with  surgery times 2 weeks. Recent infection. Staples removed yesterday. EXAM: CT ANGIOGRAPHY CHEST CT ABDOMEN AND PELVIS WITH CONTRAST TECHNIQUE: Multidetector CT imaging of the chest was performed using the standard protocol during bolus administration of intravenous contrast. Multiplanar CT image reconstructions and MIPs were obtained to evaluate the vascular anatomy. Multidetector CT imaging of the abdomen and pelvis was performed using the standard protocol during bolus administration of intravenous contrast. CONTRAST:  OMNIPAQUE IOHEXOL 350 MG/ML SOLN COMPARISON:  CT dated 03/25/2020 FINDINGS: CTA CHEST FINDINGS Cardiovascular: Contrast injection is sufficient to demonstrate satisfactory opacification of the pulmonary arteries to the segmental level. There is no pulmonary embolus or evidence of right heart strain. The size of the main pulmonary artery is normal. There is significant cardiomegaly with biatrial enlargement. Mediastinum/Nodes: -- No mediastinal lymphadenopathy. -- No hilar lymphadenopathy. -- No axillary lymphadenopathy. -- No supraclavicular lymphadenopathy. -- Normal thyroid gland where visualized. -  Unremarkable esophagus. Lungs/Pleura: There are trace bilateral pleural effusions. There is atelectasis at the lung bases. There is mild interlobular septal thickening. There is no pneumothorax. The trachea is unremarkable. Musculoskeletal: No chest wall abnormality. No bony spinal canal stenosis. CT ABDOMEN and PELVIS FINDINGS Hepatobiliary: Low-attenuation nodules are again noted throughout the patient's left and right hepatic lobes as before. Status post cholecystectomy.There is mild intrahepatic biliary ductal dilatation, unchanged from prior study. Pancreas: Normal contours without ductal dilatation. No peripancreatic fluid collection. Spleen: Unremarkable. Adrenals/Urinary Tract: --Adrenal glands: Unremarkable. --Right kidney/ureter: No hydronephrosis or radiopaque kidney stones. --Left  kidney/ureter: No hydronephrosis or radiopaque kidney stones. --Urinary bladder: Unremarkable. Stomach/Bowel: --Stomach/Duodenum: No hiatal hernia or other gastric abnormality. Normal duodenal course and caliber. --Small bowel: There are mildly dilated hyperenhancing loops of small bowel in the left mid abdomen. There is a right lower quadrant ileostomy without evidence for obstruction. --Colon: Patient is status post subtotal colectomy. --Appendix: Surgically absent. Vascular/Lymphatic: Atherosclerotic calcification is present within the non-aneurysmal abdominal aorta, without hemodynamically significant stenosis. --No retroperitoneal lymphadenopathy. --No mesenteric lymphadenopathy. --No pelvic or inguinal lymphadenopathy. Reproductive: There is a probable fibroid uterus. Other: There is a fat containing lesion abutting the dome of the urinary bladder measuring approximately 2.7 cm. This is somewhat similar cross prior studies and may represent an area of fat necrosis. There is a midline abdominal wall incision that appears open. Inferiorly there is packing material. Superior to the umbilicus, there is a somewhat ill-defined 6.3 by 1.6 by 0.6 cm fluid collection deep to the midline incision. There is no rim enhancement of this collection. There is a trace amount of free fluid in the abdomen. There is no free air. Musculoskeletal. No acute displaced fractures. Review of the MIP images confirms the above findings. IMPRESSION: 1. No acute pulmonary embolism. 2. Trace bilateral pleural effusions with atelectasis. 3. Cardiomegaly with biatrial enlargement. There are findings of mild interstitial edema. 4. Mildly dilated hyperenhancing loops of small bowel in the left mid abdomen may represent ileus or enteritis. 5. Fluid collection deep to the midline incision superior to the umbilicus as detailed above. This is favored to represent a postoperative seroma or hematoma but has increased in size from prior study. A  developing abscess is not excluded. 6. Trace amount of free fluid in the abdomen. 7. Fibroid uterus. 8. Fat containing lesion abutting the dome of the urinary bladder favored to represent an area of fat necrosis. Aortic Atherosclerosis (ICD10-I70.0). Electronically Signed   By: Katherine Mantle M.D.   On: 03/29/2020 00:03   CT Abdomen Pelvis W  Contrast  Result Date: 03/29/2020 CLINICAL DATA:  Chest pain with shortness of breath. History of bowel obstruction with surgery times 2 weeks. Recent infection. Staples removed yesterday. EXAM: CT ANGIOGRAPHY CHEST CT ABDOMEN AND PELVIS WITH CONTRAST TECHNIQUE: Multidetector CT imaging of the chest was performed using the standard protocol during bolus administration of intravenous contrast. Multiplanar CT image reconstructions and MIPs were obtained to evaluate the vascular anatomy. Multidetector CT imaging of the abdomen and pelvis was performed using the standard protocol during bolus administration of intravenous contrast. CONTRAST:  OMNIPAQUE IOHEXOL 350 MG/ML SOLN COMPARISON:  CT dated 03/25/2020 FINDINGS: CTA CHEST FINDINGS Cardiovascular: Contrast injection is sufficient to demonstrate satisfactory opacification of the pulmonary arteries to the segmental level. There is no pulmonary embolus or evidence of right heart strain. The size of the main pulmonary artery is normal. There is significant cardiomegaly with biatrial enlargement. Mediastinum/Nodes: -- No mediastinal lymphadenopathy. -- No hilar lymphadenopathy. -- No axillary lymphadenopathy. -- No supraclavicular lymphadenopathy. -- Normal thyroid gland where visualized. -  Unremarkable esophagus. Lungs/Pleura: There are trace bilateral pleural effusions. There is atelectasis at the lung bases. There is mild interlobular septal thickening. There is no pneumothorax. The trachea is unremarkable. Musculoskeletal: No chest wall abnormality. No bony spinal canal stenosis. CT ABDOMEN and PELVIS FINDINGS  Hepatobiliary: Low-attenuation nodules are again noted throughout the patient's left and right hepatic lobes as before. Status post cholecystectomy.There is mild intrahepatic biliary ductal dilatation, unchanged from prior study. Pancreas: Normal contours without ductal dilatation. No peripancreatic fluid collection. Spleen: Unremarkable. Adrenals/Urinary Tract: --Adrenal glands: Unremarkable. --Right kidney/ureter: No hydronephrosis or radiopaque kidney stones. --Left kidney/ureter: No hydronephrosis or radiopaque kidney stones. --Urinary bladder: Unremarkable. Stomach/Bowel: --Stomach/Duodenum: No hiatal hernia or other gastric abnormality. Normal duodenal course and caliber. --Small bowel: There are mildly dilated hyperenhancing loops of small bowel in the left mid abdomen. There is a right lower quadrant ileostomy without evidence for obstruction. --Colon: Patient is status post subtotal colectomy. --Appendix: Surgically absent. Vascular/Lymphatic: Atherosclerotic calcification is present within the non-aneurysmal abdominal aorta, without hemodynamically significant stenosis. --No retroperitoneal lymphadenopathy. --No mesenteric lymphadenopathy. --No pelvic or inguinal lymphadenopathy. Reproductive: There is a probable fibroid uterus. Other: There is a fat containing lesion abutting the dome of the urinary bladder measuring approximately 2.7 cm. This is somewhat similar cross prior studies and may represent an area of fat necrosis. There is a midline abdominal wall incision that appears open. Inferiorly there is packing material. Superior to the umbilicus, there is a somewhat ill-defined 6.3 by 1.6 by 0.6 cm fluid collection deep to the midline incision. There is no rim enhancement of this collection. There is a trace amount of free fluid in the abdomen. There is no free air. Musculoskeletal. No acute displaced fractures. Review of the MIP images confirms the above findings. IMPRESSION: 1. No acute pulmonary  embolism. 2. Trace bilateral pleural effusions with atelectasis. 3. Cardiomegaly with biatrial enlargement. There are findings of mild interstitial edema. 4. Mildly dilated hyperenhancing loops of small bowel in the left mid abdomen may represent ileus or enteritis. 5. Fluid collection deep to the midline incision superior to the umbilicus as detailed above. This is favored to represent a postoperative seroma or hematoma but has increased in size from prior study. A developing abscess is not excluded. 6. Trace amount of free fluid in the abdomen. 7. Fibroid uterus. 8. Fat containing lesion abutting the dome of the urinary bladder favored to represent an area of fat necrosis. Aortic Atherosclerosis (ICD10-I70.0). Electronically Signed   By:  Katherine Mantle M.D.   On: 03/29/2020 00:03   DG Chest Port 1 View  Result Date: 03/28/2020 CLINICAL DATA:  Questionable sepsis, sharp 5/10 chest pain, shortness of breath history of bowel obstruction, now post subtotal colectomy EXAM: PORTABLE CHEST 1 VIEW COMPARISON:  Radiograph 03/19/2020 FINDINGS: There is diffuse hazy interstitial opacity in the lungs with a mid to lower lung predominance and some faint septal thickening as well as central vascular congestion. More hazy opacities present in the right infrahilar lung. Small bilateral effusions are present, likely diminished from comparison accounting for differences in technique. No pneumothorax. Cardiomegaly similar to prior counting for differences in technique. The osseous structures appear diffusely demineralized which may limit detection of small or nondisplaced fractures. No acute osseous abnormality or suspicious osseous lesion. Degenerative changes are present in the imaged spine and shoulders. Soft tissues unremarkable. Telemetry leads overlie the chest. IMPRESSION: 1. Appearance could suggest some mild CHF/volume overload with cardiomegaly and features of interstitial with trace bilateral effusions. 2. More  hazy opacity in the right infrahilar lung is favored to reflect some developing alveolar edema though early airspace disease is not excluded in the setting of sepsis. Electronically Signed   By: Kreg Shropshire M.D.   On: 03/28/2020 22:16    Scheduled Meds: . amiodarone  200 mg Oral Daily  . Chlorhexidine Gluconate Cloth  6 each Topical Daily  . midodrine  5 mg Oral TID WC   Continuous Infusions: . ampicillin-sulbactam (UNASYN) IV    . lactated ringers 50 mL/hr at 03/29/20 1135    Assessment/Plan:  1. Clinical sepsis with hypotension, present on admission. General surgery thinks that this postoperative wound is a seroma. Is growing out E. coli and Enterococcus. Case discussed with pharmacist and will switch antibiotics over to Unasyn. Patient had low-grade fever tachycardia and elevated white blood cell count with hypotension. 2. Chronic atrial fibrillation restart Eliquis since the patient does not, go to the OR Case discussed with surgery.  Restart amiodarone. 3. Hypotension.  Start low-dose midodrine.  Continue IV fluids but at a lower rate. 4. 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 5. Hyponatremia improved with IV fluids      Code Status:     Code Status Orders  (From admission, onward)         Start     Ordered   03/29/20 0356  Full code  Continuous        03/29/20 0404        Code Status History    Date Active Date Inactive Code Status Order ID Comments User Context   03/11/2020 2235 03/21/2020 0011 DNR [id number]  Anselm Jungling, DO ED   12/04/2019 1919 12/07/2019 1936 Full Code [id number]  Lorretta Harp, MD Inpatient   03/17/2019 2019 03/20/2019 1956 Full Code [id number]  Mayo, Allyn Kenner, MD Inpatient   07/05/2017 0148 07/07/2017 1630 Full Code [id number]  Ihor Austin, MD Inpatient   Advance Care Planning Activity    Advance Directive Documentation     Most Recent Value  Type of Advance Directive Healthcare Power of Attorney  Pre-existing out of facility DNR  order (yellow form or pink MOST form) --  "MOST" Form in Place? --     Family Communication: Spoke with son on the phone Disposition Plan: Status is: Inpatient  Dispo: The patient is from: Home              Anticipated d/c is to: Home  Anticipated d/c date is: Likely will need 3 to 4 days              Patient currently on IV antibiotics for sepsis.  Consultants:  General surgery  Antibiotics:  Change antibiotics to Unasyn  Time spent: 35 minutes  Taheerah Guldin [employer]  [facility]

## 2020-03-29 NOTE — H&P (Signed)
History and Physical    [name] ZOX:[id number] DOB: June 06, 1945 DOA: 03/28/2020  PCP: Mickel Fuchs, MD   Patient coming from: home  I have personally briefly reviewed patient's old medical records in [facility]  Chief Complaint: Chest pain, shortness of breath   HPI: [name] is a 74 y.o. female with medical history significant for A. 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  Patient was evaluated by surgery in the outpatient setting on 10/26 when she was found to still have abdominal pain at the incision site but with well functioning ileostomy and tolerating diet well.  Staples were removed at the time.  Main complaint on arrival is constant sharp chest pain for the past 1 to 2 days, worse on taking a deep breath, associated with mild shortness of breath.  She denies cough, fever or chills.  She still has incisional site discomfort but denies nausea vomiting or change in her ileostomy output. ED Course: On arrival in the ED she was febrile at 100.5, tachycardic at 106 with BP 109/56, BP trending down after several hours in the emergency room to as low as 86/53 from MAP of 63.  Her blood work showed WBC of 17,000 with a lactic acid of 2.  CMP with sodium of 129, bicarb of 18.  Creatinine normal.  Urinalysis clear.  CT chest showed no PE CT abdomen and pelvis significant for fluid collection deep to the midline incision superior to the umbilicus, favored to represent postoperative seroma or hematoma but has increased in size from prior study on 10/24.  Early abscess not excluded.  Patient was started on sepsis protocol.  Hospitalist consulted for admission.   Review of Systems: As per HPI otherwise  all other systems on review of systems negative.    Past Medical History:  Diagnosis Date  . Acute respiratory failure (HCC)    Secondary to aspiration pneumonia   . Altered mental status    Secondary to viral herpes simplex virus encephalitis  . Anemia   . Atrial fibrillation (HCC)   . Bilateral pneumonia   . CHF (congestive heart failure) (HCC)   . Dysphasia   . Encephalitis   . Hyperglycemia   . Hypertension   . Hyponatremia   . IBS (irritable bowel syndrome)   . Seizures (HCC)   . Stroke [facility])     Past Surgical History:  Procedure Laterality Date  . CHOLECYSTECTOMY    . COLECTOMY WITH COLOSTOMY CREATION/HARTMANN PROCEDURE N/A 03/12/2020   Procedure: COLECTOMY WITH COLOSTOMY CREATION/HARTMANN PROCEDURE;  Surgeon: Campbell Lerner, MD;  Location: [facility] ORS;  Service: General;  Laterality: N/A;  . COLONOSCOPY N/A 01/17/2020   Procedure: COLONOSCOPY;  Surgeon: Regis Bill, MD;  Location: [facility] ENDOSCOPY;  Service: Endoscopy;  Laterality: N/A;  . COLONOSCOPY WITH PROPOFOL N/A 12/31/2016   Procedure: COLONOSCOPY WITH PROPOFOL;  Surgeon: Scot Jun, MD;  Location: [facility] ENDOSCOPY;  Service: Endoscopy;  Laterality: N/A;  . ESOPHAGOGASTRODUODENOSCOPY (EGD) WITH PROPOFOL N/A 12/31/2016   Procedure: ESOPHAGOGASTRODUODENOSCOPY (EGD) WITH PROPOFOL;  Surgeon: Scot Jun, MD;  Location: [facility] ENDOSCOPY;  Service: Endoscopy;  Laterality: N/A;  . LAPAROTOMY N/A 03/12/2020   Procedure: EXPLORATORY LAPAROTOMY;  Surgeon: Campbell Lerner, MD;  Location: [facility] ORS;  Service: General;  Laterality: N/A;  . TONSILLECTOMY    . TUBAL LIGATION       reports that she has never smoked. She has never used smokeless tobacco. She reports that she does not drink alcohol and does not use drugs.  Allergies  Allergen Reactions  . Prednisone Hives  . Predicort [Prednisolone Acetate]     HIVES,    Family History  Problem Relation Age of Onset  . Prostate cancer Father   . Breast cancer  Maternal Aunt   . Breast cancer Maternal Aunt   . Cerebral palsy Son       Prior to Admission medications   Medication Sig Start Date End Date Taking? Authorizing Provider  acetaminophen (TYLENOL) 325 MG tablet Take 650 mg by mouth every 6 (six) hours as needed.   Yes [provider]  amiodarone (PACERONE) 200 MG tablet Take 1 tablet (200 mg total) by mouth daily. 03/20/20  Yes Darlin Priestly, MD  amoxicillin-clavulanate (AUGMENTIN) 875-125 MG tablet Take 1 tablet by mouth 2 (two) times daily for 10 days. 03/25/20 04/04/20 Yes Delton Prairie, MD  apixaban (ELIQUIS) 5 MG TABS tablet Take 1 tablet (5 mg total) by mouth 2 (two) times daily. 03/20/19  Yes Altamese Dilling, MD  dicyclomine (BENTYL) 10 MG capsule Take 10 mg by mouth every 6 (six) hours as needed. 12/12/19  Yes [provider]  diltiazem (CARDIZEM CD) 240 MG 24 hr capsule Take 1 capsule (240 mg total) by mouth daily. 03/21/19  Yes Altamese Dilling, MD  Ensure Max Protein (ENSURE MAX PROTEIN) LIQD Take 330 mLs (11 oz total) by mouth 2 (two) times daily. 03/20/20  Yes Darlin Priestly, MD  folic acid (FOLVITE) 1 MG tablet Take 1 mg by mouth daily. Per tube once daily     Yes [provider]  furosemide (LASIX) 20 MG tablet Hold until followup with outpatient doctor, since your blood pressure is low, and you are already loosing lots fluids in your ostomy. 03/20/20  Yes Darlin Priestly, MD  guaiFENesin (MUCINEX) 600 MG 12 hr tablet Take 1 tablet (600 mg total) by mouth 2 (two) times daily. 03/20/20  Yes Darlin Priestly, MD  hydrocortisone (ANUSOL-HC) 2.5 % rectal cream Apply 1 application topically 2 (two) times daily as needed. 02/28/20  Yes [provider]  levothyroxine (SYNTHROID) 75 MCG tablet Take 75 mcg by mouth daily before breakfast.    Yes [provider]  metoprolol succinate (TOPROL-XL) 25 MG 24 hr tablet Take 2 tablets (50 mg total) by mouth daily. 03/20/20  Yes Darlin Priestly, MD  Multiple Vitamin  (MULTIVITAMIN WITH MINERALS) TABS tablet Take 1 tablet by mouth daily. 03/20/20  Yes Darlin Priestly, MD  omeprazole (PRILOSEC) 20 MG capsule Take 20 mg by mouth daily.   Yes [provider]  polycarbophil (FIBERCON) 625 MG tablet Take 1 tablet (625 mg total) by mouth 3 (three) times daily. 03/20/20  Yes Darlin Priestly, MD  potassium chloride (KLOR-CON) 10 MEQ tablet Take 10 mEq by mouth daily.   Yes [provider]  traMADol (ULTRAM) 50 MG tablet Take 50 mg by mouth every 6 (six) hours as needed.   Yes [provider]  cephALEXin (KEFLEX) 500 MG capsule Take 500 mg by mouth 4 (four) times daily. Patient not taking: Reported on 03/29/2020    [provider]    Physical Exam: Vitals:   03/28/20 2127 03/29/20 0100 03/29/20 0207 03/29/20 0300  BP: (!) 109/56 (!) 89/55 (!) 96/57 (!) 88/58  Pulse: (!) 106  98 87 93  Resp: 16 (!) 26 19 19   Temp: (!) 100.5 F (38.1 C)     TempSrc: Oral     SpO2: 99% 100% 99% 98%     Vitals:   03/28/20 2127 03/29/20 0100 03/29/20 0207 03/29/20 0300  BP: (!) 109/56 (!) 89/55 (!) 96/57 (!) 88/58  Pulse: (!) 106 98 87 93  Resp: 16 (!) 26 19 19   Temp: (!) 100.5 F (38.1 C)     TempSrc: Oral     SpO2: 99% 100% 99% 98%      Constitutional: Alert and oriented x 3 . Not in any apparent distress HEENT:      Head: Normocephalic and atraumatic.         Eyes: PERLA, EOMI, Conjunctivae are normal. Sclera is non-icteric.       Mouth/Throat: Mucous membranes are moist.       Neck: Supple with no signs of meningismus. Cardiovascular: Regular rate and rhythm. No murmurs, gallops, or rubs. 2+ symmetrical distal pulses are present . No JVD. No LE edema Respiratory: Respiratory effort normal .Lungs sounds clear bilaterally. No wheezes, crackles, or rhonchi.  Gastrointestinal:   midline surgical scar with no redness or bruising on borders appeared well approximated.  Staples have all been removed.  Ileostomy bag right lower quadrant.  Abdomen  with mild tenderness no midline surrounding scar  and non distended with positive bowel sounds.  Genitourinary: No CVA tenderness. Musculoskeletal: Nontender with normal range of motion in all extremities. No cyanosis, or erythema of extremities. Neurologic:  Face is symmetric. Moving all extremities. No gross focal neurologic deficits . Skin: Skin is warm, dry.  No rash or ulcers Psychiatric: Mood and affect are normal    Labs on Admission: I have personally reviewed following labs and imaging studies  CBC: Recent Labs  Lab 03/25/20 1701 03/28/20 2150  WBC 15.2* 17.4*  NEUTROABS 12.0* 14.9*  HGB 11.0* 10.8*  HCT 33.7* 33.7*  MCV 81.8 83.4  PLT 395 359   Basic Metabolic Panel: Recent Labs  Lab 03/25/20 1701 03/28/20 2150  NA 131* 129*  K 4.6 4.3  CL 102 98  CO2 18* 18*  GLUCOSE 100* 84  BUN 8 13  CREATININE 0.74 0.53  CALCIUM 8.4* 8.4*   GFR: Estimated Creatinine Clearance: 53.8 mL/min (by C-G formula based on SCr of 0.53 mg/dL). Liver Function Tests: Recent Labs  Lab 03/28/20 2150  AST 41  ALT 40  ALKPHOS 112  BILITOT 0.7  PROT 6.9  ALBUMIN 3.1*   No results for input(s): LIPASE, AMYLASE in the last 168 hours. No results for input(s): AMMONIA in the last 168 hours. Coagulation Profile: Recent Labs  Lab 03/28/20 2150  INR 1.4*   Cardiac Enzymes: No results for input(s): CKTOTAL, CKMB, CKMBINDEX, TROPONINI in the last 168 hours. BNP (last 3 results) No results for input(s): PROBNP in the last 8760 hours. HbA1C: No results for input(s): HGBA1C in the last 72 hours. CBG: No results for input(s): GLUCAP in the last 168 hours. Lipid Profile: No results for input(s): CHOL, HDL, LDLCALC, TRIG, CHOLHDL, LDLDIRECT in the last 72 hours. Thyroid Function Tests: No results for input(s): TSH, T4TOTAL, FREET4, T3FREE, THYROIDAB in the last 72 hours. Anemia Panel: No results for input(s): VITAMINB12, FOLATE, FERRITIN, TIBC, IRON, RETICCTPCT in the last 72  hours. Urine analysis:    Component Value Date/Time   COLORURINE STRAW (A) 03/28/2020 2359   APPEARANCEUR CLEAR (A) 03/28/2020 2359   APPEARANCEUR Clear 08/29/2011 0037  LABSPEC 1.018 03/28/2020 2359   LABSPEC 1.009 08/29/2011 0037   PHURINE 5.0 03/28/2020 2359   GLUCOSEU NEGATIVE 03/28/2020 2359   GLUCOSEU Negative 08/29/2011 0037   HGBUR NEGATIVE 03/28/2020 2359   BILIRUBINUR NEGATIVE 03/28/2020 2359   BILIRUBINUR Negative 08/29/2011 0037   KETONESUR NEGATIVE 03/28/2020 2359   PROTEINUR NEGATIVE 03/28/2020 2359   UROBILINOGEN 0.2 01/17/2011 0037   NITRITE NEGATIVE 03/28/2020 2359   LEUKOCYTESUR NEGATIVE 03/28/2020 2359   LEUKOCYTESUR 1+ 08/29/2011 0037    Radiological Exams on Admission: CT Angio Chest PE W/Cm &/Or Wo Cm  Result Date: 03/29/2020 CLINICAL DATA:  Chest pain with shortness of breath. History of bowel obstruction with surgery times 2 weeks. Recent infection. Staples removed yesterday. EXAM: CT ANGIOGRAPHY CHEST CT ABDOMEN AND PELVIS WITH CONTRAST TECHNIQUE: Multidetector CT imaging of the chest was performed using the standard protocol during bolus administration of intravenous contrast. Multiplanar CT image reconstructions and MIPs were obtained to evaluate the vascular anatomy. Multidetector CT imaging of the abdomen and pelvis was performed using the standard protocol during bolus administration of intravenous contrast. CONTRAST:  OMNIPAQUE IOHEXOL 350 MG/ML SOLN COMPARISON:  CT dated 03/25/2020 FINDINGS: CTA CHEST FINDINGS Cardiovascular: Contrast injection is sufficient to demonstrate satisfactory opacification of the pulmonary arteries to the segmental level. There is no pulmonary embolus or evidence of right heart strain. The size of the main pulmonary artery is normal. There is significant cardiomegaly with biatrial enlargement. Mediastinum/Nodes: -- No mediastinal lymphadenopathy. -- No hilar lymphadenopathy. -- No axillary lymphadenopathy. -- No  supraclavicular lymphadenopathy. -- Normal thyroid gland where visualized. -  Unremarkable esophagus. Lungs/Pleura: There are trace bilateral pleural effusions. There is atelectasis at the lung bases. There is mild interlobular septal thickening. There is no pneumothorax. The trachea is unremarkable. Musculoskeletal: No chest wall abnormality. No bony spinal canal stenosis. CT ABDOMEN and PELVIS FINDINGS Hepatobiliary: Low-attenuation nodules are again noted throughout the patient's left and right hepatic lobes as before. Status post cholecystectomy.There is mild intrahepatic biliary ductal dilatation, unchanged from prior study. Pancreas: Normal contours without ductal dilatation. No peripancreatic fluid collection. Spleen: Unremarkable. Adrenals/Urinary Tract: --Adrenal glands: Unremarkable. --Right kidney/ureter: No hydronephrosis or radiopaque kidney stones. --Left kidney/ureter: No hydronephrosis or radiopaque kidney stones. --Urinary bladder: Unremarkable. Stomach/Bowel: --Stomach/Duodenum: No hiatal hernia or other gastric abnormality. Normal duodenal course and caliber. --Small bowel: There are mildly dilated hyperenhancing loops of small bowel in the left mid abdomen. There is a right lower quadrant ileostomy without evidence for obstruction. --Colon: Patient is status post subtotal colectomy. --Appendix: Surgically absent. Vascular/Lymphatic: Atherosclerotic calcification is present within the non-aneurysmal abdominal aorta, without hemodynamically significant stenosis. --No retroperitoneal lymphadenopathy. --No mesenteric lymphadenopathy. --No pelvic or inguinal lymphadenopathy. Reproductive: There is a probable fibroid uterus. Other: There is a fat containing lesion abutting the dome of the urinary bladder measuring approximately 2.7 cm. This is somewhat similar cross prior studies and may represent an area of fat necrosis. There is a midline abdominal wall incision that appears open. Inferiorly there is  packing material. Superior to the umbilicus, there is a somewhat ill-defined 6.3 by 1.6 by 0.6 cm fluid collection deep to the midline incision. There is no rim enhancement of this collection. There is a trace amount of free fluid in the abdomen. There is no free air. Musculoskeletal. No acute displaced fractures. Review of the MIP images confirms the above findings. IMPRESSION: 1. No acute pulmonary embolism. 2. Trace bilateral pleural effusions with atelectasis. 3. Cardiomegaly with biatrial enlargement. There are findings of mild interstitial edema.  4. Mildly dilated hyperenhancing loops of small bowel in the left mid abdomen may represent ileus or enteritis. 5. Fluid collection deep to the midline incision superior to the umbilicus as detailed above. This is favored to represent a postoperative seroma or hematoma but has increased in size from prior study. A developing abscess is not excluded. 6. Trace amount of free fluid in the abdomen. 7. Fibroid uterus. 8. Fat containing lesion abutting the dome of the urinary bladder favored to represent an area of fat necrosis. Aortic Atherosclerosis (ICD10-I70.0). Electronically Signed   By: Katherine Mantle M.D.   On: 03/29/2020 00:03   CT Abdomen Pelvis W Contrast  Result Date: 03/29/2020 CLINICAL DATA:  Chest pain with shortness of breath. History of bowel obstruction with surgery times 2 weeks. Recent infection. Staples removed yesterday. EXAM: CT ANGIOGRAPHY CHEST CT ABDOMEN AND PELVIS WITH CONTRAST TECHNIQUE: Multidetector CT imaging of the chest was performed using the standard protocol during bolus administration of intravenous contrast. Multiplanar CT image reconstructions and MIPs were obtained to evaluate the vascular anatomy. Multidetector CT imaging of the abdomen and pelvis was performed using the standard protocol during bolus administration of intravenous contrast. CONTRAST:  OMNIPAQUE IOHEXOL 350 MG/ML SOLN COMPARISON:  CT dated 03/25/2020  FINDINGS: CTA CHEST FINDINGS Cardiovascular: Contrast injection is sufficient to demonstrate satisfactory opacification of the pulmonary arteries to the segmental level. There is no pulmonary embolus or evidence of right heart strain. The size of the main pulmonary artery is normal. There is significant cardiomegaly with biatrial enlargement. Mediastinum/Nodes: -- No mediastinal lymphadenopathy. -- No hilar lymphadenopathy. -- No axillary lymphadenopathy. -- No supraclavicular lymphadenopathy. -- Normal thyroid gland where visualized. -  Unremarkable esophagus. Lungs/Pleura: There are trace bilateral pleural effusions. There is atelectasis at the lung bases. There is mild interlobular septal thickening. There is no pneumothorax. The trachea is unremarkable. Musculoskeletal: No chest wall abnormality. No bony spinal canal stenosis. CT ABDOMEN and PELVIS FINDINGS Hepatobiliary: Low-attenuation nodules are again noted throughout the patient's left and right hepatic lobes as before. Status post cholecystectomy.There is mild intrahepatic biliary ductal dilatation, unchanged from prior study. Pancreas: Normal contours without ductal dilatation. No peripancreatic fluid collection. Spleen: Unremarkable. Adrenals/Urinary Tract: --Adrenal glands: Unremarkable. --Right kidney/ureter: No hydronephrosis or radiopaque kidney stones. --Left kidney/ureter: No hydronephrosis or radiopaque kidney stones. --Urinary bladder: Unremarkable. Stomach/Bowel: --Stomach/Duodenum: No hiatal hernia or other gastric abnormality. Normal duodenal course and caliber. --Small bowel: There are mildly dilated hyperenhancing loops of small bowel in the left mid abdomen. There is a right lower quadrant ileostomy without evidence for obstruction. --Colon: Patient is status post subtotal colectomy. --Appendix: Surgically absent. Vascular/Lymphatic: Atherosclerotic calcification is present within the non-aneurysmal abdominal aorta, without hemodynamically  significant stenosis. --No retroperitoneal lymphadenopathy. --No mesenteric lymphadenopathy. --No pelvic or inguinal lymphadenopathy. Reproductive: There is a probable fibroid uterus. Other: There is a fat containing lesion abutting the dome of the urinary bladder measuring approximately 2.7 cm. This is somewhat similar cross prior studies and may represent an area of fat necrosis. There is a midline abdominal wall incision that appears open. Inferiorly there is packing material. Superior to the umbilicus, there is a somewhat ill-defined 6.3 by 1.6 by 0.6 cm fluid collection deep to the midline incision. There is no rim enhancement of this collection. There is a trace amount of free fluid in the abdomen. There is no free air. Musculoskeletal. No acute displaced fractures. Review of the MIP images confirms the above findings. IMPRESSION: 1. No acute pulmonary embolism. 2. Trace bilateral pleural  effusions with atelectasis. 3. Cardiomegaly with biatrial enlargement. There are findings of mild interstitial edema. 4. Mildly dilated hyperenhancing loops of small bowel in the left mid abdomen may represent ileus or enteritis. 5. Fluid collection deep to the midline incision superior to the umbilicus as detailed above. This is favored to represent a postoperative seroma or hematoma but has increased in size from prior study. A developing abscess is not excluded. 6. Trace amount of free fluid in the abdomen. 7. Fibroid uterus. 8. Fat containing lesion abutting the dome of the urinary bladder favored to represent an area of fat necrosis. Aortic Atherosclerosis (ICD10-I70.0). Electronically Signed   By: Katherine Mantle M.D.   On: 03/29/2020 00:03   DG Chest Port 1 View  Result Date: 03/28/2020 CLINICAL DATA:  Questionable sepsis, sharp 5/10 chest pain, shortness of breath history of bowel obstruction, now post subtotal colectomy EXAM: PORTABLE CHEST 1 VIEW COMPARISON:  Radiograph 03/19/2020 FINDINGS: There is diffuse  hazy interstitial opacity in the lungs with a mid to lower lung predominance and some faint septal thickening as well as central vascular congestion. More hazy opacities present in the right infrahilar lung. Small bilateral effusions are present, likely diminished from comparison accounting for differences in technique. No pneumothorax. Cardiomegaly similar to prior counting for differences in technique. The osseous structures appear diffusely demineralized which may limit detection of small or nondisplaced fractures. No acute osseous abnormality or suspicious osseous lesion. Degenerative changes are present in the imaged spine and shoulders. Soft tissues unremarkable. Telemetry leads overlie the chest. IMPRESSION: 1. Appearance could suggest some mild CHF/volume overload with cardiomegaly and features of interstitial with trace bilateral effusions. 2. More hazy opacity in the right infrahilar lung is favored to reflect some developing alveolar edema though early airspace disease is not excluded in the setting of sepsis. Electronically Signed   By: Kreg Shropshire M.D.   On: 03/28/2020 22:16     Assessment/Plan 74 year old female with history of A. fib on Eliquis and amiodarone, HTN, diastolic CHF, status post recent subtotal colectomy on 10/11 for complete colonic obstruction from stricture from diverticulitis presenting with chest pain and shortness of breath    Severe sepsis (HCC)    Possible early abscess of postoperative wound of abdominal wall   S/P subtotal colectomy for complete colonic obstruction on 03/12/20 with ileostomy with recent postop wound infection -Patient with fever of 100.6, tachycardia of 116, WBC 17,000 and lactic acid 2 and hypotensive at 86/53 -IV fluid bolus per sepsis protocol -IV cefepime and vancomycin for cellulitis/wound infection -Close hemodynamic monitoring in stepdown  Ileostomy status -Ileostomy care    Atrial fibrillation (HCC) -Hold home Eliquis in case patient  needs to go to the OR for I&D or evacuation of possible wound hematoma -Lovenox for DVT prophylaxis -We will hold amiodarone and metoprolol for now given hypotension  Pleuritic chest pain -No evidence of PE or other acute intrathoracic abnormality on CT chest  Essential hypertension -Hold home antihypertensives given hypotension    Hypothyroidism -Continue levothyroxine    Hyponatremia, chronic -Sodium 129 which is about the same for the past 2 weeks -Continue to monitor    Chronic diastolic heart failure (HCC) -Hold all potentially blood pressure lowering meds for now -Monitor for fluid overload in view of IV hydration and management of sepsis    History of CVA (cerebrovascular accident) -No acute issues.  Continue statin.  Eliquis on hold    DVT prophylaxis: Lovenox  Code Status: full code  Family Communication:  none  Disposition Plan: Back to previous home environment Consults called: Surgery Status:At the time of admission, it appears that the appropriate admission status for this patient is INPATIENT. This is judged to be reasonable and necessary in order to provide the required intensity of service to ensure the patient's safety given the presenting symptoms, physical exam findings, and initial radiographic and laboratory data in the context of their  Comorbid conditions.   Patient requires inpatient status due to high intensity of service, high risk for further deterioration and high frequency of surveillance required.   I certify that at the point of admission it is my clinical judgment that the patient will require inpatient hospital care spanning beyond 2 midnights     Andris Baumann MD [facility]     03/29/2020, 4:05 AM

## 2020-03-29 NOTE — Consult Note (Signed)
[facility] SURGICAL CONSULTATION NOTE (initial) - cpt: [phone]   HISTORY OF PRESENT ILLNESS (HPI):  74 y.o. 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 Post-operative course was complicated by inferior superficial midline wound infection which was drained on 10/24. She was started on Augmentin. Cx from this grew out E coli which was resistant to ampicillin, ciprofloxacin, and bactrim. She had been on Augmentin as outpatient.   She presented to [facility] ED yesterday for evaluation of chest pain and SOB. Patient reports that she was laying down to go to bed last night and felt a 5/10 chest pressure in the center of her chest with associated SOB and feeling like she couldn't get a deep breath. She initially thought this was related to broccoli soup she ate for dinner but the pain persisted. She denied any fever, chills, cough, congestion, abdominal pain, nausea, or emesis. Ileostomy continued to function without change. On presentation to the ED, she did have a low grade fever to 100.3F, was tachycardic to 106. She was found to have a leukocytosis to 17K (now 16.5k), lactic acidosis to 2.0, and CTA Chest + CT Abdomen/Pelvis were concerning for subcutaneous fluid collection in midline wound most likely representing seroma -vs- hematoma -vs- abscess. She was admitted to medicine service for presumed sepsis and started on Cefepime, Flagyl, and Vancomycin  Surgery is consulted by hospitalist physician Dr. Lindajo Royal, MD in this context for evaluation and management of midline wound fluid collection in setting of recent surgery.    PAST MEDICAL HISTORY (PMH):  Past Medical History:  Diagnosis Date  . Acute respiratory failure (HCC)    Secondary to aspiration pneumonia   . Altered mental status    Secondary to viral herpes simplex virus encephalitis  . Anemia   . Atrial fibrillation (HCC)   . Bilateral pneumonia   .  CHF (congestive heart failure) (HCC)   . Dysphasia   . Encephalitis   . Hyperglycemia   . Hypertension   . Hyponatremia   . IBS (irritable bowel syndrome)   . Seizures (HCC)   . Stroke [facility])      PAST SURGICAL HISTORY (PSH):  Past Surgical History:  Procedure Laterality Date  . CHOLECYSTECTOMY    . COLECTOMY WITH COLOSTOMY CREATION/HARTMANN PROCEDURE N/A 03/12/2020   Procedure: COLECTOMY WITH COLOSTOMY CREATION/HARTMANN PROCEDURE;  Surgeon: Campbell Lerner, MD;  Location: [facility] ORS;  Service: General;  Laterality: N/A;  . COLONOSCOPY N/A 01/17/2020   Procedure: COLONOSCOPY;  Surgeon: Regis Bill, MD;  Location: [facility] ENDOSCOPY;  Service: Endoscopy;  Laterality: N/A;  . COLONOSCOPY WITH PROPOFOL N/A 12/31/2016   Procedure: COLONOSCOPY WITH PROPOFOL;  Surgeon: Scot Jun, MD;  Location[facility] ENDOSCOPY;  Service: Endoscopy;  Laterality: N/A;  . ESOPHAGOGASTRODUODENOSCOPY (EGD) WITH PROPOFOL N/A 12/31/2016   Procedure: ESOPHAGOGASTRODUODENOSCOPY (EGD) WITH PROPOFOL;  Surgeon: Scot Jun, MD;  Location: [facility] ENDOSCOPY;  Service: Endoscopy;  Laterality: N/A;  . LAPAROTOMY N/A 03/12/2020   Procedure: EXPLORATORY LAPAROTOMY;  Surgeon: Campbell Lerner, MD;  Location: [facility] ORS;  Service: General;  Laterality: N/A;  . TONSILLECTOMY    . TUBAL LIGATION       MEDICATIONS:  Prior to Admission medications   Medication Sig Start Date End Date Taking? Authorizing Provider  acetaminophen (TYLENOL) 325 MG tablet Take 650 mg by mouth every 6 (six) hours as needed.   Yes [provider]  amiodarone (PACERONE) 200 MG tablet Take 1 tablet (200  mg total) by mouth daily. 03/20/20  Yes Darlin Priestly, MD  amoxicillin-clavulanate (AUGMENTIN) 875-125 MG tablet Take 1 tablet by mouth 2 (two) times daily for 10 days. 03/25/20 04/04/20 Yes Delton Prairie, MD  apixaban (ELIQUIS) 5 MG TABS tablet Take 1 tablet (5 mg total) by mouth 2 (two) times daily. 03/20/19  Yes Altamese Dilling, MD   dicyclomine (BENTYL) 10 MG capsule Take 10 mg by mouth every 6 (six) hours as needed. 12/12/19  Yes [provider]  diltiazem (CARDIZEM CD) 240 MG 24 hr capsule Take 1 capsule (240 mg total) by mouth daily. 03/21/19  Yes Altamese Dilling, MD  Ensure Max Protein (ENSURE MAX PROTEIN) LIQD Take 330 mLs (11 oz total) by mouth 2 (two) times daily. 03/20/20  Yes Darlin Priestly, MD  folic acid (FOLVITE) 1 MG tablet Take 1 mg by mouth daily. Per tube once daily     Yes [provider]  furosemide (LASIX) 20 MG tablet Hold until followup with outpatient doctor, since your blood pressure is low, and you are already loosing lots fluids in your ostomy. 03/20/20  Yes Darlin Priestly, MD  guaiFENesin (MUCINEX) 600 MG 12 hr tablet Take 1 tablet (600 mg total) by mouth 2 (two) times daily. 03/20/20  Yes Darlin Priestly, MD  hydrocortisone (ANUSOL-HC) 2.5 % rectal cream Apply 1 application topically 2 (two) times daily as needed. 02/28/20  Yes [provider]  levothyroxine (SYNTHROID) 75 MCG tablet Take 75 mcg by mouth daily before breakfast.    Yes [provider]  metoprolol succinate (TOPROL-XL) 25 MG 24 hr tablet Take 2 tablets (50 mg total) by mouth daily. 03/20/20  Yes Darlin Priestly, MD  Multiple Vitamin (MULTIVITAMIN WITH MINERALS) TABS tablet Take 1 tablet by mouth daily. 03/20/20  Yes Darlin Priestly, MD  omeprazole (PRILOSEC) 20 MG capsule Take 20 mg by mouth daily.   Yes [provider]  polycarbophil (FIBERCON) 625 MG tablet Take 1 tablet (625 mg total) by mouth 3 (three) times daily. 03/20/20  Yes Darlin Priestly, MD  potassium chloride (KLOR-CON) 10 MEQ tablet Take 10 mEq by mouth daily.   Yes [provider]  traMADol (ULTRAM) 50 MG tablet Take 50 mg by mouth every 6 (six) hours as needed.   Yes [provider]  cephALEXin (KEFLEX) 500 MG capsule Take 500 mg by mouth 4 (four) times daily. Patient not taking: Reported on 03/29/2020    [provider]      ALLERGIES:  Allergies  Allergen Reactions  . Prednisone Hives  . Predicort [Prednisolone Acetate]     HIVES,     SOCIAL HISTORY:  Social History   Socioeconomic History  . Marital status: Widowed    Spouse name: Not on file  . Number of children: 2  . Years of education: Not on file  . Highest education level: Not on file  Occupational History  . Occupation: medical transcript  Tobacco Use  . Smoking status: Never Smoker  . Smokeless tobacco: Never Used  Vaping Use  . Vaping Use: Never used  Substance and Sexual Activity  . Alcohol use: No  . Drug use: No  . Sexual activity: Never  Other Topics Concern  . Not on file  Social History Narrative  . Not on file   Social Determinants of Health   Financial Resource Strain: Low Risk   . Difficulty of Paying Living Expenses: Not hard at all  Food Insecurity: No Food Insecurity  . Worried About Programme researcher, broadcasting/film/video  in the Last Year: Never true  . Ran Out of Food in the Last Year: Never true  Transportation Needs: No Transportation Needs  . Lack of Transportation (Medical): No  . Lack of Transportation (Non-Medical): No  Physical Activity: Insufficiently Active  . Days of Exercise per Week: 2 days  . Minutes of Exercise per Session: 30 min  Stress: No Stress Concern Present  . Feeling of Stress : Not at all  Social Connections: Moderately Isolated  . Frequency of Communication with Friends and Family: More than three times a week  . Frequency of Social Gatherings with Friends and Family: More than three times a week  . Attends Religious Services: 1 to 4 times per year  . Active Member of Clubs or Organizations: No  . Attends Banker Meetings: Never  . Marital Status: Widowed  Intimate Partner Violence: Not At Risk  . Fear of Current or Ex-Partner: No  . Emotionally Abused: No  . Physically Abused: No  . Sexually Abused: No     FAMILY HISTORY:  Family History  Problem Relation Age of Onset  .  Prostate cancer Father   . Breast cancer Maternal Aunt   . Breast cancer Maternal Aunt   . Cerebral palsy Son       REVIEW OF SYSTEMS:  Review of Systems  Constitutional: Positive for fever. Negative for chills.  HENT: Negative for congestion and sore throat.   Respiratory: Positive for shortness of breath (Resolved). Negative for cough.   Cardiovascular: Positive for chest pain (Resolved). Negative for palpitations.  Gastrointestinal: Negative for abdominal pain, nausea and vomiting.  Genitourinary: Negative for dysuria and urgency.  All other systems reviewed and are negative.   VITAL SIGNS:  Temp:  [97.8 F (36.6 C)-100.5 F (38.1 C)] 98.4 F (36.9 C) (10/28 0550) Pulse Rate:  [82-122] 104 (10/28 0630) Resp:  [16-26] 17 (10/28 0630) BP: (86-109)/(53-63) 99/58 (10/28 0630) SpO2:  [96 %-100 %] 98 % (10/28 0630) Weight:  [64.3 kg] 64.3 kg (10/28 0550)     Height: 5\' 5"  (165.1 cm) Weight: 64.3 kg BMI (Calculated): 23.59   INTAKE/OUTPUT:  10/27 0701 - 10/28 0700 In: 592.3 [I.V.:592.3] Out: -   PHYSICAL EXAM:  Physical Exam Vitals and nursing note reviewed.  Constitutional:      General: She is not in acute distress.    Appearance: Normal appearance. She is normal weight. She is not ill-appearing.     Comments: Well appearing female, in bright spirits, NAD, reports feeling better without complaints.   HENT:     Head: Normocephalic and atraumatic.     Mouth/Throat:     Mouth: Mucous membranes are moist.  Eyes:     Extraocular Movements: Extraocular movements intact.     Pupils: Pupils are equal, round, and reactive to light.  Cardiovascular:     Rate and Rhythm: Normal rate. Rhythm irregularly irregular.  Pulmonary:     Effort: Pulmonary effort is normal. No respiratory distress.     Breath sounds: Normal breath sounds.  Abdominal:     General: Abdomen is flat.     Tenderness: There is no abdominal tenderness. There is no guarding or rebound.     Hernia: No hernia  is present.     Comments: Abdomen is soft, incisional soreness unchanged from recent exam outpatient, non-distended, no rebound/guarding. Ileostomy in RLQ, patent, semi-solid stool in bag.   Genitourinary:    Comments: Deferred Musculoskeletal:     Right lower leg: No edema.  Left lower leg: No edema.  Skin:    General: Skin is warm and dry.     Coloration: Skin is not jaundiced or pale.     Comments: Midline laparotomy present. The inferior 3-4 cm are open and healing via secondary intention, the wound bed is nearly 100% granulation tissue, there is packing place, no surrounding erythema or drainage throughout the remaining laparotomy. Steri-strips in place.   Neurological:     General: No focal deficit present.     Mental Status: She is alert and oriented to person, place, and time.  Psychiatric:        Mood and Affect: Mood normal.        Behavior: Behavior normal.      Labs:  CBC Latest Ref Rng & Units 03/29/2020 03/28/2020 03/25/2020  WBC 4.0 - 10.5 K/uL 16.5(H) 17.4(H) 15.2(H)  Hemoglobin 12.0 - 15.0 g/dL 1.6(X9.3(L) 10.8(L) 11.0(L)  Hematocrit 36 - 46 % 29.3(L) 33.7(L) 33.7(L)  Platelets 150 - 400 K/uL 271 359 395   CMP Latest Ref Rng & Units 03/29/2020 03/28/2020 03/25/2020  Glucose 70 - 99 mg/dL 99 84 096(E100(H)  BUN 8 - 23 mg/dL 9 13 8   Creatinine 0.44 - 1.00 mg/dL 4.540.48 0.980.53 1.190.74  Sodium 135 - 145 mmol/L 133(L) 129(L) 131(L)  Potassium 3.5 - 5.1 mmol/L 3.7 4.3 4.6  Chloride 98 - 111 mmol/L 107 98 102  CO2 22 - 32 mmol/L 18(L) 18(L) 18(L)  Calcium 8.9 - 10.3 mg/dL 7.7(L) 8.4(L) 8.4(L)  Total Protein 6.5 - 8.1 g/dL - 6.9 -  Total Bilirubin 0.3 - 1.2 mg/dL - 0.7 -  Alkaline Phos 38 - 126 U/L - 112 -  AST 15 - 41 U/L - 41 -  ALT 0 - 44 U/L - 40 -     Imaging studies:   CTA Chest & CT Abdomen/Pelvis (03/28/2020) personally reviewed which shows small midline wound fluid collection superior to umbilicus, most likely seroma, no other acute intra-abdominal process or  complication noted, and radiologist report reviewed below:  IMPRESSION: 1. No acute pulmonary embolism. 2. Trace bilateral pleural effusions with atelectasis. 3. Cardiomegaly with biatrial enlargement. There are findings of mild interstitial edema. 4. Mildly dilated hyperenhancing loops of small bowel in the left mid abdomen may represent ileus or enteritis. 5. Fluid collection deep to the midline incision superior to the umbilicus as detailed above. This is favored to represent a postoperative seroma or hematoma but has increased in size from prior study. A developing abscess is not excluded. 6. Trace amount of free fluid in the abdomen. 7. Fibroid uterus. 8. Fat containing lesion abutting the dome of the urinary bladder favored to represent an area of fat necrosis.   Assessment/Plan: (ICD-10's: A41.9) 74 y.o. female admitted with presumed sepsis of unclear source. She does not have any gross intra-abdominal process or complication to explain her presentation. She was found to have midline wound fluid collections to the superior aspect however I suspect this may be more representative of a seroma given her exam and I am not convinced this is her septic source.    - Continue local wound care: Pack with iodoform gauze daily, cover with gauze and secure   - Continue ABx; narrow based on culture results from 10/24  - Monitor ileostomy function  - Monitor abdominal examination  - Monitor leukocytosis; improving   - No further interventions at this time from surgical standpoint   - Further management per primary service; we will follow  All of the  above findings and recommendations were discussed with the patient, and all of patient's questions were answered to her expressed satisfaction.  Thank you for the opportunity to participate in this patient's care.   -- Lynden Oxford, PA-C [facility] 03/29/2020, 8:24 AM [phone] M-F: 7am - 4pm

## 2020-03-29 NOTE — ED Notes (Signed)
Pt resting in bed with lights off for comfort, denies needs. States CP is relieved. Awaiting trop results at this time. Pt medicated per MAR. AO x4, side rails up x2, call bell within reach

## 2020-03-29 NOTE — ED Notes (Signed)
Report called to ICU receiving RN

## 2020-03-29 NOTE — Progress Notes (Signed)
ANTICOAGULATION CONSULT NOTE  Pharmacy Consult for restarting apixaban Indication: atrial fibrillation  Vital Signs: Temp: 97.8 F (36.6 C) (10/28 1200) Temp Source: Oral (10/28 1200) BP: 89/59 (10/28 1400) Pulse Rate: 91 (10/28 1400)  Labs: Recent Labs    03/28/20 2150 03/29/20 0431  HGB 10.8* 9.3*  HCT 33.7* 29.3*  PLT 359 271  APTT 35  --   LABPROT 17.0* 16.6*  INR 1.4* 1.4*  CREATININE 0.53 0.48  TROPONINIHS 7 6    Estimated Creatinine Clearance: 53.8 mL/min (by C-G formula based on SCr of 0.48 mg/dL).   Medical History: Past Medical History:  Diagnosis Date  . Acute respiratory failure (HCC)    Secondary to aspiration pneumonia   . Altered mental status    Secondary to viral herpes simplex virus encephalitis  . Anemia   . Atrial fibrillation (HCC)   . Bilateral pneumonia   . CHF (congestive heart failure) (HCC)   . Dysphasia   . Encephalitis   . Hyperglycemia   . Hypertension   . Hyponatremia   . IBS (irritable bowel syndrome)   . Seizures (HCC)   . Stroke [facility])     Medications:  Scheduled:  . amiodarone  200 mg Oral Daily  . apixaban  5 mg Oral BID  . Chlorhexidine Gluconate Cloth  6 each Topical Daily  . midodrine  5 mg Oral TID WC    Assessment: 74 y.o. 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 She was on apixaban PTA which was held on admission for a possible surgery which was deemed unnecessary.  Goal of Therapy:  Monitor platelets by anticoagulation protocol: Yes   Plan:   Restart apixaban 5 mg po BID  Patient meets 0/3 dose reduction criterea  CBC at least every 3 days per protocol  Lowella Bandy 03/29/2020,2:34 PM

## 2020-03-29 NOTE — Progress Notes (Signed)
Pharmacy Antibiotic Note  [name] is a 74 y.o. 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 Pharmacy has been consulted for Unasyn dosing. Her renal function appears to be stable and at her apparent baseline  Plan:   start Unasyn 3 grams IV every 6 hours   Height: 5\' 5"  (165.1 cm) Weight: 64.3 kg (141 lb 12.1 oz) IBW/kg (Calculated) : 57  Temp (24hrs), Avg:98.9 F (37.2 C), Min:97.8 F (36.6 C), Max:100.5 F (38.1 C)  Recent Labs  Lab 03/25/20 1701 03/28/20 2150 03/28/20 2359 03/29/20 0431  WBC 15.2* 17.4*  --  16.5*  CREATININE 0.74 0.53  --  0.48  LATICACIDVEN 2.3* 2.3* 2.0*  --     Estimated Creatinine Clearance: 53.8 mL/min (by C-G formula based on SCr of 0.48 mg/dL).    Allergies  Allergen Reactions  . Prednisone Hives  . Predicort [Prednisolone Acetate]     HIVES,    Antimicrobials this admission: vancomycin 10/27 >>10/28  cefepime 10/27 >> 10/28 metronidazole 10/28 >> 10/28 Unasyn 10/28 >>  Microbiology results: 10/27 BCx: NG < 24h 10/27 UCx: pending  10/27 SARS CoV-2: negative 10/27 influenza A/B: negative  10/28 MRSA PCR: negative 10/24 WCx: E Coli + E faecium (see culture for resistance)  Thank you for allowing pharmacy to be a part of this patient's care.  11/24 03/29/2020 7:45 AM

## 2020-03-29 NOTE — ED Notes (Signed)
Pt son at bedside, pt aware of plan of care

## 2020-03-30 ENCOUNTER — Other Ambulatory Visit: Payer: Self-pay

## 2020-03-30 DIAGNOSIS — I4891 Unspecified atrial fibrillation: Secondary | ICD-10-CM

## 2020-03-30 LAB — BASIC METABOLIC PANEL
Anion gap: 8 (ref 5–15)
BUN: 8 mg/dL (ref 8–23)
CO2: 22 mmol/L (ref 22–32)
Calcium: 8.1 mg/dL — ABNORMAL LOW (ref 8.9–10.3)
Chloride: 105 mmol/L (ref 98–111)
Creatinine, Ser: 0.56 mg/dL (ref 0.44–1.00)
GFR, Estimated: >60 mL/min (ref >60)
Glucose, Bld: 112 mg/dL — ABNORMAL HIGH (ref 70–99)
Potassium: 3.7 mmol/L (ref 3.5–5.1)
Sodium: 135 mmol/L (ref 135–145)

## 2020-03-30 LAB — CBC
HCT: 29.6 % — ABNORMAL LOW (ref 36.0–46.0)
Hemoglobin: 9.6 g/dL — ABNORMAL LOW (ref 12.0–15.0)
MCH: 26.9 pg (ref 26.0–34.0)
MCHC: 32.4 g/dL (ref 30.0–36.0)
MCV: 82.9 fL (ref 80.0–100.0)
Platelets: 267 10*3/uL (ref 150–400)
RBC: 3.57 MIL/uL — ABNORMAL LOW (ref 3.87–5.11)
RDW: 17.2 % — ABNORMAL HIGH (ref 11.5–15.5)
WBC: 10.4 10*3/uL (ref 4.0–10.5)
nRBC: 0 % (ref 0.0–0.2)

## 2020-03-30 LAB — URINE CULTURE: Culture: NO GROWTH

## 2020-03-30 LAB — PROCALCITONIN: Procalcitonin: <0.1 ng/mL

## 2020-03-30 MED ORDER — METOPROLOL TARTRATE 5 MG/5ML IV SOLN
5.0000 mg | INTRAVENOUS | Status: DC | PRN
  Administered 2020-03-31: 5 mg via INTRAVENOUS
  Filled 2020-03-30 (×2): qty 5

## 2020-03-30 MED ORDER — HYDROCERIN EX CREA
TOPICAL_CREAM | Freq: Two times a day (BID) | CUTANEOUS | Status: DC
  Filled 2020-03-30 (×2): qty 113

## 2020-03-30 MED ORDER — METOPROLOL SUCCINATE ER 25 MG PO TB24
25.0000 mg | ORAL_TABLET | Freq: Every day | ORAL | Status: DC
  Administered 2020-03-30: 25 mg via ORAL
  Filled 2020-03-30 (×2): qty 1

## 2020-03-30 NOTE — NC FL2 (Signed)
[place] MEDICAID FL2 LEVEL OF CARE SCREENING TOOL     IDENTIFICATION  Patient Name: [name] Birthdate: 07/04/1945 Sex: female Admission Date (Current Location): 03/28/2020  [place] and [place] Number:  Chiropodist and Address:  [facility], [address], [place], [place] [address]      Provider Number: [id number]  Attending Physician Name and Address:  Alford Highland, MD  Relative Name and Phone Number:  Zahlia, Deshazer (Son) [phone] [facility])    Current Level of Care: Hospital Recommended Level of Care: Skilled Nursing Facility Prior Approval Number:    Date Approved/Denied:   PASRR Number: [id number]  Discharge Plan: SNF    Current Diagnoses: Patient Active Problem List   Diagnosis Date Noted  . Sepsis without acute organ dysfunction (HCC) 03/29/2020  .  Possible Abscess of postoperative wound of abdominal wall 03/29/2020  . S/P laparotomy 03/12/20  03/29/2020  . History of CVA (cerebrovascular accident) 03/29/2020  . Hypotension   . Malnutrition of moderate degree 03/19/2020  . Hyponatremia 03/12/2020  . Chronic diastolic heart failure (HCC) 03/12/2020  . Large bowel obstruction (HCC) 03/12/2020  . Abdominal distention 03/11/2020  . Iron deficiency anemia 12/20/2019  . Acute on chronic diastolic CHF (congestive heart failure) (HCC) 12/04/2019  . Atrial fibrillation (HCC)   . Hypertension   . Stroke (HCC)   . Hypothyroidism   . Acute respiratory failure with hypoxia (HCC)   . Acute congestive heart failure (HCC) 03/17/2019  . Acute gastroenteritis 07/05/2017  . Acute respiratory failure (HCC) 03/27/2011    Orientation RESPIRATION BLADDER Height & Weight     Self, Time, Situation, Place  Normal Continent Weight: 141 lb 12.1 oz (64.3 kg) Height:  5\' 5"  (165.1 cm)  BEHAVIORAL SYMPTOMS/MOOD NEUROLOGICAL BOWEL NUTRITION STATUS      Continent Diet (heart diet, thin liquids)  AMBULATORY STATUS COMMUNICATION  OF NEEDS Skin   Extensive Assist Verbally Surgical wounds                       Personal Care Assistance Level of Assistance  Bathing, Feeding, Dressing Bathing Assistance: Maximum assistance Feeding assistance: Independent Dressing Assistance: Maximum assistance     Functional Limitations Info  Sight, Hearing, Speech Sight Info: Adequate Hearing Info: Adequate Speech Info: Adequate    SPECIAL CARE FACTORS FREQUENCY  PT (By licensed PT), OT (By licensed OT)     PT Frequency: 5 x/week OT Frequency: 5 x/week            Contractures Contractures Info: Not present    Additional Factors Info  Code Status, Allergies Code Status Info: full code Allergies Info: prednisone, predicort           Current Medications (03/30/2020):  This is the current hospital active medication list Current Facility-Administered Medications  Medication Dose Route Frequency Provider Last Rate Last Admin  . acetaminophen (TYLENOL) tablet 650 mg  650 mg Oral Q6H PRN 04/01/2020, MD       Or  . acetaminophen (TYLENOL) suppository 650 mg  650 mg Rectal Q6H PRN Andris Baumann, MD      . amiodarone (PACERONE) tablet 200 mg  200 mg Oral Daily Andris Baumann, MD   200 mg at 03/30/20 0917  . Ampicillin-Sulbactam (UNASYN) 3 g in sodium chloride 0.9 % 100 mL IVPB  3 g Intravenous Q6H 04/01/20, RPH 200 mL/hr at 03/30/20 1127 3 g at 03/30/20 1127  . apixaban (ELIQUIS) tablet 5 mg  5 mg Oral BID Alford Highland, MD   5 mg at 03/30/20 0917  . Chlorhexidine Gluconate Cloth 2 % PADS 6 each  6 each Topical Daily Jimmye Norman, NP   6 each at 03/30/20 [phone]  . HYDROcodone-acetaminophen (NORCO/VICODIN) 5-325 MG per tablet 1-2 tablet  1-2 tablet Oral Q4H PRN Andris Baumann, MD   1 tablet at 03/30/20 1519  . lactated ringers infusion   Intravenous Continuous Alford Highland, MD 50 mL/hr at 03/29/20 1135 Rate Change at 03/29/20 1135  . metoprolol succinate (TOPROL-XL) 24 hr tablet 25 mg   25 mg Oral Daily Alford Highland, MD   25 mg at 03/30/20 1017  . metoprolol tartrate (LOPRESSOR) injection 5 mg  5 mg Intravenous Q1H PRN Wieting, Richard, MD      . midodrine (PROAMATINE) tablet 5 mg  5 mg Oral TID WC Alford Highland, MD   5 mg at 03/30/20 1128  . ondansetron (ZOFRAN) tablet 4 mg  4 mg Oral Q6H PRN Andris Baumann, MD       Or  . ondansetron [facility]) injection 4 mg  4 mg Intravenous Q6H PRN Andris Baumann, MD         Discharge Medications: Please see discharge summary for a list of discharge medications.  Relevant Imaging Results:  Relevant Lab Results:   Additional Information SSN:[id number]  Farha Dano E Libra Gatz, LCSW

## 2020-03-30 NOTE — Plan of Care (Signed)

## 2020-03-30 NOTE — TOC Initial Note (Signed)
Transition of Care [facility]) - Initial/Assessment Note    Patient Details  Name: EMMALEE SOLIVAN MRN: [id number] Date of Birth: 03-02-1946  Transition of Care [facility]) CM/SW Contact:    Magnus Ivan, LCSW Phone Number: 03/30/2020, 3:29 PM  Clinical Narrative:                CSW met with patient at bedside to discuss PT recommendation and complete Readmission Screening. Patient reported she lives alone. However she was at [facility] for rehab prior to this hospitalization. Patient has a four prong cane at home and reported [facility] ordered her a rolling walker. Patient's PCP is Philis Pique and pharmacy is [facility]. Patient had [facility] in the past. PT is recommending SNF rehab. Patient reported she would like to return to [facility] for more skilled rehab at discharge. CSW spoke with [employer] who confirmed they can accept patient back at discharge. SNF work up started.   Expected Discharge Plan: Skilled Nursing Facility Barriers to Discharge: Continued Medical Work up   Patient Goals and CMS Choice Patient states their goals for this hospitalization and ongoing recovery are:: to return to SNF rehab CMS Medicare.gov Compare Post Acute Care list provided to:: Patient Choice offered to / list presented to : Patient  Expected Discharge Plan and Services Expected Discharge Plan: [place] Choice: [place] Living arrangements for the past 2 months: Single Family Home                                      Prior Living Arrangements/Services Living arrangements for the past 2 months: Single Family Home Lives with:: Self Patient language and need for interpreter reviewed:: Yes Do you feel safe going back to the place where you live?: Yes      Need for Family Participation in Patient Care: Yes (Comment) Care giver support system in place?: Yes  (comment) Current home services: DME Criminal Activity/Legal Involvement Pertinent to Current Situation/Hospitalization: No - Comment as needed  Activities of Daily Living      Permission Sought/Granted Permission sought to share information with : Chartered certified accountant granted to share information with : Yes, Verbal Permission Granted     Permission granted to share info w AGENCY: [facility]        Emotional Assessment Appearance:: Appears stated age Attitude/Demeanor/Rapport: Gracious, Engaged Affect (typically observed): Appropriate, Calm Orientation: : Oriented to Situation, Oriented to  Time, Oriented to Place, Oriented to Self Alcohol / Substance Use: Not Applicable Psych Involvement: No (comment)  Admission diagnosis:  Nonspecific chest pain [R07.9] Sepsis ([place]) [A41.9] Sepsis, due to unspecified organism, unspecified whether acute organ dysfunction present [facility]) [A41.9] Patient Active Problem List   Diagnosis Date Noted  . Sepsis without acute organ dysfunction ([place]) 03/29/2020  .  Possible Abscess of postoperative wound of abdominal wall 03/29/2020  . S/P laparotomy 03/12/20  03/29/2020  . History of CVA (cerebrovascular accident) 03/29/2020  . Hypotension   . Malnutrition of moderate degree 03/19/2020  . Hyponatremia 03/12/2020  . Chronic diastolic heart failure ([place]) 03/12/2020  . Large bowel obstruction ([place]) 03/12/2020  . Abdominal distention 03/11/2020  . Iron deficiency anemia 12/20/2019  . Acute on chronic diastolic CHF (congestive heart failure) ([place]) 12/04/2019  . Atrial fibrillation ([place])   . Hypertension   .  Stroke ([place])   . Hypothyroidism   . Acute respiratory failure with hypoxia ([place])   . Acute congestive heart failure ([place]) 03/17/2019  . Acute gastroenteritis 07/05/2017  . Acute respiratory failure ([place]) 03/27/2011   PCP:  Elba Barman, MD Pharmacy:   [place], [place] - [address] [address] Phone: [phone] Fax: [phone]     Social Determinants of Health (SDOH) Interventions    Readmission Risk Interventions Readmission Risk Prevention Plan 03/30/2020  Transportation Screening Complete  PCP or Specialist Appt within 3-5 Days Complete  HRI or [place] Complete  Social Work Consult for [place] Planning/Counseling Complete  Palliative Care Screening Not Applicable  Medication Review Press photographer) Complete  Some recent data might be hidden

## 2020-03-30 NOTE — Progress Notes (Signed)
Report given to floor RN,patient transferred to floor on CM. Denies in SOB or pain.

## 2020-03-30 NOTE — Progress Notes (Signed)
Pharmacy Antibiotic Note  [name] is a 74 y.o. 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 Pharmacy has been consulted for Unasyn dosing. Her renal function appears to be stable and at her apparent baseline  Plan:  Day 3 IV antibiotics Continue  Unasyn 3 grams IV every 6 hours   Height: 5\' 5"  (165.1 cm) Weight: 64.3 kg (141 lb 12.1 oz) IBW/kg (Calculated) : 57  Temp (24hrs), Avg:97.9 F (36.6 C), Min:97.6 F (36.4 C), Max:98.4 F (36.9 C)  Recent Labs  Lab 03/25/20 1701 03/28/20 2150 03/28/20 2359 03/29/20 0431 03/30/20 0414  WBC 15.2* 17.4*  --  16.5* 10.4  CREATININE 0.74 0.53  --  0.48 0.56  LATICACIDVEN 2.3* 2.3* 2.0*  --   --     Estimated Creatinine Clearance: 53.8 mL/min (by C-G formula based on SCr of 0.56 mg/dL).    Allergies  Allergen Reactions  . Prednisone Hives  . Predicort [Prednisolone Acetate]     HIVES,    Antimicrobials this admission: vancomycin 10/27 >>10/28  cefepime 10/27 >> 10/28 metronidazole 10/28 >> 10/28 Unasyn 10/28 >>  Microbiology results: 10/27 BCx: NG < 24h 10/27 UCx: pending  10/27 SARS CoV-2: negative 10/27 influenza A/B: negative  10/28 MRSA PCR: negative 10/24 WCx: E Coli + E faecium (see culture for resistance)  Thank you for allowing pharmacy to be a part of this patient's care.  11/24, PharmD, BCPS Clinical Pharmacist 03/30/2020 8:38 AM

## 2020-03-30 NOTE — Progress Notes (Signed)
Patient ID: [name], female   DOB: 11-20-1945, 74 y.o.   MRN: [id number] [facility] PROGRESS NOTE  ELLIETT GUARISCO NWG:[id number] DOB: [birth year] DOA: 03/28/2020 PCP: Mickel Fuchs, MD  HPI/Subjective: Patient feeling a little bit better today.  Still having a little bit of abdominal pain.  Having some soreness in her feet and toes and wants some cream for that.  Heart rate was going fast this morning.  Objective: Vitals:   03/30/20 0900 03/30/20 1542  BP:  106/75  Pulse: (!) 108 (!) 105  Resp:  17  Temp:  97.7 F (36.5 C)  SpO2:  97%    Intake/Output Summary (Last 24 hours) at 03/30/2020 1601 Last data filed at 03/30/2020 1336 Gross per 24 hour  Intake 1061.9 ml  Output 2150 ml  Net -1088.1 ml   Filed Weights   03/29/20 0550  Weight: 64.3 kg    ROS: Review of Systems  Respiratory: Negative for cough and shortness of breath.   Cardiovascular: Negative for chest pain.  Gastrointestinal: Positive for abdominal pain. Negative for nausea and vomiting.   Exam: Physical Exam HENT:     Head: Normocephalic.     Mouth/Throat:     Pharynx: No oropharyngeal exudate.  Eyes:     General: Lids are normal.     Conjunctiva/sclera: Conjunctivae normal.     Pupils: Pupils are equal, round, and reactive to light.  Cardiovascular:     Rate and Rhythm: Tachycardia present. Rhythm irregularly irregular.     Heart sounds: S1 normal and S2 normal. Murmur heard.  Systolic murmur is present with a grade of 4/6.   Pulmonary:     Effort: Pulmonary effort is normal.     Breath sounds: No decreased breath sounds, wheezing, rhonchi or rales.  Abdominal:     Palpations: Abdomen is soft.     Tenderness: There is no abdominal tenderness.  Musculoskeletal:     Right lower leg: No swelling.     Left lower leg: No swelling.  Skin:    General: Skin is warm.     Findings: No rash.     Comments: Abdominal wound covered.  Neurological:     Mental Status: She is alert and  oriented to person, place, and time.       Data Reviewed: Basic Metabolic Panel: Recent Labs  Lab 03/25/20 1701 03/28/20 2150 03/29/20 0431 03/30/20 0414  NA 131* 129* 133* 135  K 4.6 4.3 3.7 3.7  CL 102 98 107 105  CO2 18* 18* 18* 22  GLUCOSE 100* 84 99 112*  BUN 8 13 9 8   CREATININE 0.74 0.53 0.48 0.56  CALCIUM 8.4* 8.4* 7.7* 8.1*   Liver Function Tests: Recent Labs  Lab 03/28/20 2150  AST 41  ALT 40  ALKPHOS 112  BILITOT 0.7  PROT 6.9  ALBUMIN 3.1*   CBC: Recent Labs  Lab 03/25/20 1701 03/28/20 2150 03/29/20 0431 03/30/20 0414  WBC 15.2* 17.4* 16.5* 10.4  NEUTROABS 12.0* 14.9*  --   --   HGB 11.0* 10.8* 9.3* 9.6*  HCT 33.7* 33.7* 29.3* 29.6*  MCV 81.8 83.4 86.2 82.9  PLT 395 359 271 267   BNP (last 3 results) Recent Labs    12/07/19 0751 03/13/20 0303 03/13/20 1718  BNP 686.2* 981.4* 419.8*    CBG: Recent Labs  Lab 03/29/20 0546  GLUCAP 84    Recent Results (from the past 240 hour(s))  Aerobic/Anaerobic Culture (surgical/deep wound)     Status: None  Collection Time: 03/25/20 10:52 PM   Specimen: Abscess; Wound  Result Value Ref Range Status   Specimen Description   Final    ABSCESS Performed at [facility], [address] [place], [place] [address]    Special Requests   Final    NONE Performed at [facility], [address] [place], [place] [address]    Gram Stain   Final    ABUNDANT WBC PRESENT, PREDOMINANTLY PMN MODERATE GRAM POSITIVE COCCI MODERATE GRAM NEGATIVE RODS MODERATE GRAM POSITIVE RODS    Culture   Final    RARE ESCHERICHIA COLI RARE ENTEROCOCCUS FAECIUM FEW BACTEROIDES FRAGILIS BETA LACTAMASE POSITIVE Performed at [facility], [address] [place], [place] [address]    Report Status 03/29/2020 FINAL  Final   Organism ID, Bacteria ESCHERICHIA COLI  Final   Organism ID, Bacteria ENTEROCOCCUS FAECIUM  Final      Susceptibility   Escherichia coli - MIC*    AMPICILLIN >=32  RESISTANT Resistant     CEFAZOLIN <=4 SENSITIVE Sensitive     CEFEPIME <=0.12 SENSITIVE Sensitive     CEFTAZIDIME <=1 SENSITIVE Sensitive     CEFTRIAXONE <=0.25 SENSITIVE Sensitive     CIPROFLOXACIN >=4 RESISTANT Resistant     GENTAMICIN <=1 SENSITIVE Sensitive     IMIPENEM <=0.25 SENSITIVE Sensitive     TRIMETH/SULFA >=320 RESISTANT Resistant     AMPICILLIN/SULBACTAM 8 SENSITIVE Sensitive     PIP/TAZO <=4 SENSITIVE Sensitive     * RARE ESCHERICHIA COLI   Enterococcus faecium - MIC*    AMPICILLIN <=2 SENSITIVE Sensitive     VANCOMYCIN <=0.5 SENSITIVE Sensitive     GENTAMICIN SYNERGY SENSITIVE Sensitive     * RARE ENTEROCOCCUS FAECIUM  Blood Culture (routine x 2)     Status: None (Preliminary result)   Collection Time: 03/28/20  9:50 PM   Specimen: BLOOD  Result Value Ref Range Status   Specimen Description BLOOD BLOOD RIGHT FOREARM  Final   Special Requests   Final    BOTTLES DRAWN AEROBIC AND ANAEROBIC Blood Culture results may not be optimal due to an inadequate volume of blood received in culture bottles   Culture   Final    NO GROWTH 2 DAYS Performed at [facility], [address] [place], [place] [address]    Report Status PENDING  Incomplete  Blood Culture (routine x 2)     Status: None (Preliminary result)   Collection Time: 03/28/20  9:50 PM   Specimen: BLOOD  Result Value Ref Range Status   Specimen Description BLOOD BLOOD RIGHT FOREARM  Final   Special Requests   Final    BOTTLES DRAWN AEROBIC AND ANAEROBIC Blood Culture results may not be optimal due to an inadequate volume of blood received in culture bottles   Culture   Final    NO GROWTH 2 DAYS Performed at [facility], [address] [place], [place] [address]    Report Status PENDING  Incomplete  Urine culture     Status: None   Collection Time: 03/28/20 11:59 PM   Specimen: In/Out Cath Urine  Result Value Ref Range Status   Specimen Description   Final    IN/OUT CATH  URINE Performed at [facility], [address] [place], [place] [address]    Special Requests   Final    NONE Performed at [facility], [address] [place], [place] [address]    Culture   Final  NO GROWTH Performed at [facility], [address] [place], [place] [address]    Report Status 03/30/2020 FINAL  Final  Respiratory Panel by RT PCR (Flu A&B, Covid) - Nasopharyngeal Swab     Status: None   Collection Time: 03/28/20 11:59 PM   Specimen: Nasopharyngeal Swab  Result Value Ref Range Status   SARS Coronavirus 2 by RT PCR NEGATIVE NEGATIVE Final    Comment: (NOTE) SARS-CoV-2 target nucleic acids are NOT DETECTED.  The SARS-CoV-2 RNA is generally detectable in upper respiratoy specimens during the acute phase of infection. The lowest concentration of SARS-CoV-2 viral copies this assay can detect is 131 copies/mL. A negative result does not preclude SARS-Cov-2 infection and should not be used as the sole basis for treatment or other patient management decisions. A negative result may occur with  improper specimen collection/handling, submission of specimen other than nasopharyngeal swab, presence of viral mutation(s) within the areas targeted by this assay, and inadequate number of viral copies (<131 copies/mL). A negative result must be combined with clinical observations, patient history, and epidemiological information. The expected result is Negative.  Fact Sheet for Patients:  https://www.moore.com/  Fact Sheet for Healthcare Providers:  https://www.young.biz/  This test is no t yet approved or cleared by the [place] FDA and  has been authorized for detection and/or diagnosis of SARS-CoV-2 by FDA under an Emergency Use Authorization (EUA). This EUA will remain  in effect (meaning this test can be used) for the duration of the COVID-19 declaration under Section 564(b)(1) of the Act, 21  U.S.C. section 360bbb-3(b)(1), unless the authorization is terminated or revoked sooner.     Influenza A by PCR NEGATIVE NEGATIVE Final   Influenza B by PCR NEGATIVE NEGATIVE Final    Comment: (NOTE) The Xpert Xpress SARS-CoV-2/FLU/RSV assay is intended as an aid in  the diagnosis of influenza from Nasopharyngeal swab specimens and  should not be used as a sole basis for treatment. Nasal washings and  aspirates are unacceptable for Xpert Xpress SARS-CoV-2/FLU/RSV  testing.  Fact Sheet for Patients: https://www.moore.com/  Fact Sheet for Healthcare Providers: https://www.young.biz/  This test is not yet approved or cleared by the [place] FDA and  has been authorized for detection and/or diagnosis of SARS-CoV-2 by  FDA under an Emergency Use Authorization (EUA). This EUA will remain  in effect (meaning this test can be used) for the duration of the  Covid-19 declaration under Section 564(b)(1) of the Act, 21  U.S.C. section 360bbb-3(b)(1), unless the authorization is  terminated or revoked. Performed at [facility], [address] [place], [place] [address]   MRSA PCR Screening     Status: None   Collection Time: 03/29/20  5:47 AM   Specimen: Nasopharyngeal  Result Value Ref Range Status   MRSA by PCR NEGATIVE NEGATIVE Final    Comment:        The GeneXpert MRSA Assay (FDA approved for NASAL specimens only), is one component of a comprehensive MRSA colonization surveillance program. It is not intended to diagnose MRSA infection nor to guide or monitor treatment for MRSA infections. Performed at [facility], [address] [place], [place] [address]   Gastrointestinal Panel by PCR , Stool     Status: None   Collection Time: 03/29/20  7:52 AM   Specimen: Stool  Result Value Ref Range Status   Campylobacter species NOT DETECTED NOT DETECTED Final   Plesimonas shigelloides NOT DETECTED NOT DETECTED Final  Salmonella species NOT DETECTED NOT DETECTED Final   Yersinia enterocolitica NOT DETECTED NOT DETECTED Final   Vibrio species NOT DETECTED NOT DETECTED Final   Vibrio cholerae NOT DETECTED NOT DETECTED Final   Enteroaggregative E coli (EAEC) NOT DETECTED NOT DETECTED Final   Enteropathogenic E coli (EPEC) NOT DETECTED NOT DETECTED Final   Enterotoxigenic E coli (ETEC) NOT DETECTED NOT DETECTED Final   Shiga like toxin producing E coli (STEC) NOT DETECTED NOT DETECTED Final   Shigella/Enteroinvasive E coli (EIEC) NOT DETECTED NOT DETECTED Final   Cryptosporidium NOT DETECTED NOT DETECTED Final   Cyclospora cayetanensis NOT DETECTED NOT DETECTED Final   Entamoeba histolytica NOT DETECTED NOT DETECTED Final   Giardia lamblia NOT DETECTED NOT DETECTED Final   Adenovirus F40/41 NOT DETECTED NOT DETECTED Final   Astrovirus NOT DETECTED NOT DETECTED Final   Norovirus GI/GII NOT DETECTED NOT DETECTED Final   Rotavirus A NOT DETECTED NOT DETECTED Final   Sapovirus (I, II, IV, and V) NOT DETECTED NOT DETECTED Final    Comment: Performed at [facility], [address] [place], [place] [address]  C Difficile Quick Screen w PCR reflex     Status: None   Collection Time: 03/29/20  7:52 AM   Specimen: Stool  Result Value Ref Range Status   C Diff antigen NEGATIVE NEGATIVE Final   C Diff toxin NEGATIVE NEGATIVE Final   C Diff interpretation No C. difficile detected.  Final    Comment: Performed at [facility], [address] [place], [place] [address]     Studies: CT Angio Chest PE W/Cm &/Or Wo Cm  Result Date: 03/29/2020 CLINICAL DATA:  Chest pain with shortness of breath. History of bowel obstruction with surgery times 2 weeks. Recent infection. Staples removed yesterday. EXAM: CT ANGIOGRAPHY CHEST CT ABDOMEN AND PELVIS WITH CONTRAST TECHNIQUE: Multidetector CT imaging of the chest was performed using the standard protocol during bolus administration of intravenous  contrast. Multiplanar CT image reconstructions and MIPs were obtained to evaluate the vascular anatomy. Multidetector CT imaging of the abdomen and pelvis was performed using the standard protocol during bolus administration of intravenous contrast. CONTRAST:  OMNIPAQUE IOHEXOL 350 MG/ML SOLN COMPARISON:  CT dated 03/25/2020 FINDINGS: CTA CHEST FINDINGS Cardiovascular: Contrast injection is sufficient to demonstrate satisfactory opacification of the pulmonary arteries to the segmental level. There is no pulmonary embolus or evidence of right heart strain. The size of the main pulmonary artery is normal. There is significant cardiomegaly with biatrial enlargement. Mediastinum/Nodes: -- No mediastinal lymphadenopathy. -- No hilar lymphadenopathy. -- No axillary lymphadenopathy. -- No supraclavicular lymphadenopathy. -- Normal thyroid gland where visualized. -  Unremarkable esophagus. Lungs/Pleura: There are trace bilateral pleural effusions. There is atelectasis at the lung bases. There is mild interlobular septal thickening. There is no pneumothorax. The trachea is unremarkable. Musculoskeletal: No chest wall abnormality. No bony spinal canal stenosis. CT ABDOMEN and PELVIS FINDINGS Hepatobiliary: Low-attenuation nodules are again noted throughout the patient's left and right hepatic lobes as before. Status post cholecystectomy.There is mild intrahepatic biliary ductal dilatation, unchanged from prior study. Pancreas: Normal contours without ductal dilatation. No peripancreatic fluid collection. Spleen: Unremarkable. Adrenals/Urinary Tract: --Adrenal glands: Unremarkable. --Right kidney/ureter: No hydronephrosis or radiopaque kidney stones. --Left kidney/ureter: No hydronephrosis or radiopaque kidney stones. --Urinary bladder: Unremarkable. Stomach/Bowel: --Stomach/Duodenum: No hiatal hernia or other gastric abnormality. Normal duodenal course and caliber. --Small bowel: There are mildly dilated hyperenhancing  loops of small bowel in the left mid abdomen. 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 --Colon: Patient is status post subtotal colectomy. --Appendix: Surgically absent. Vascular/Lymphatic: Atherosclerotic calcification is present within the non-aneurysmal abdominal aorta, without hemodynamically significant stenosis. --No retroperitoneal lymphadenopathy. --No mesenteric lymphadenopathy. --No pelvic or inguinal lymphadenopathy. Reproductive: There is a probable fibroid uterus. Other: There is a fat containing lesion abutting the dome of the urinary bladder measuring approximately 2.7 cm. This is somewhat similar cross prior studies and may represent an area of fat necrosis. There is a midline abdominal wall incision that appears open. Inferiorly there is packing material. Superior to the umbilicus, there is a somewhat ill-defined 6.3 by 1.6 by 0.6 cm fluid collection deep to the midline incision. There is no rim enhancement of this collection. There is a trace amount of free fluid in the abdomen. There is no free air. Musculoskeletal. No acute displaced fractures. Review of the MIP images confirms the above findings. IMPRESSION: 1. No acute pulmonary embolism. 2. Trace bilateral pleural effusions with atelectasis. 3. Cardiomegaly with biatrial enlargement. There are findings of mild interstitial edema. 4. Mildly dilated hyperenhancing loops of small bowel in the left mid abdomen may represent ileus or enteritis. 5. Fluid collection deep to the midline incision superior to the umbilicus as detailed above. This is favored to represent a postoperative seroma or hematoma but has increased in size from prior study. A developing abscess is not excluded. 6. Trace amount of free fluid in the abdomen. 7. Fibroid uterus. 8. Fat containing lesion abutting the dome of the urinary bladder favored to represent an area of fat necrosis. Aortic Atherosclerosis (ICD10-I70.0). Electronically Signed    By: Katherine Mantle M.D.   On: 03/29/2020 00:03   CT Abdomen Pelvis W Contrast  Result Date: 03/29/2020 CLINICAL DATA:  Chest pain with shortness of breath. History of bowel obstruction with surgery times 2 weeks. Recent infection. Staples removed yesterday. EXAM: CT ANGIOGRAPHY CHEST CT ABDOMEN AND PELVIS WITH CONTRAST TECHNIQUE: Multidetector CT imaging of the chest was performed using the standard protocol during bolus administration of intravenous contrast. Multiplanar CT image reconstructions and MIPs were obtained to evaluate the vascular anatomy. Multidetector CT imaging of the abdomen and pelvis was performed using the standard protocol during bolus administration of intravenous contrast. CONTRAST:  OMNIPAQUE IOHEXOL 350 MG/ML SOLN COMPARISON:  CT dated 03/25/2020 FINDINGS: CTA CHEST FINDINGS Cardiovascular: Contrast injection is sufficient to demonstrate satisfactory opacification of the pulmonary arteries to the segmental level. There is no pulmonary embolus or evidence of right heart strain. The size of the main pulmonary artery is normal. There is significant cardiomegaly with biatrial enlargement. Mediastinum/Nodes: -- No mediastinal lymphadenopathy. -- No hilar lymphadenopathy. -- No axillary lymphadenopathy. -- No supraclavicular lymphadenopathy. -- Normal thyroid gland where visualized. -  Unremarkable esophagus. Lungs/Pleura: There are trace bilateral pleural effusions. There is atelectasis at the lung bases. There is mild interlobular septal thickening. There is no pneumothorax. The trachea is unremarkable. Musculoskeletal: No chest wall abnormality. No bony spinal canal stenosis. CT ABDOMEN and PELVIS FINDINGS Hepatobiliary: Low-attenuation nodules are again noted throughout the patient's left and right hepatic lobes as before. Status post cholecystectomy.There is mild intrahepatic biliary ductal dilatation, unchanged from prior study. Pancreas: Normal contours without ductal  dilatation. No peripancreatic fluid collection. Spleen: Unremarkable. Adrenals/Urinary Tract: --Adrenal glands: Unremarkable. --Right kidney/ureter: No hydronephrosis or radiopaque kidney stones. --Left kidney/ureter: No hydronephrosis or radiopaque kidney stones. --Urinary bladder: Unremarkable. Stomach/Bowel: --Stomach/Duodenum: No hiatal hernia or other gastric abnormality. Normal duodenal course and caliber. --Small bowel: There are mildly  dilated hyperenhancing loops of small bowel in the left mid abdomen. There is a right lower quadrant ileostomy without evidence for obstruction. --Colon: Patient is status post subtotal colectomy. --Appendix: Surgically absent. Vascular/Lymphatic: Atherosclerotic calcification is present within the non-aneurysmal abdominal aorta, without hemodynamically significant stenosis. --No retroperitoneal lymphadenopathy. --No mesenteric lymphadenopathy. --No pelvic or inguinal lymphadenopathy. Reproductive: There is a probable fibroid uterus. Other: There is a fat containing lesion abutting the dome of the urinary bladder measuring approximately 2.7 cm. This is somewhat similar cross prior studies and may represent an area of fat necrosis. There is a midline abdominal wall incision that appears open. Inferiorly there is packing material. Superior to the umbilicus, there is a somewhat ill-defined 6.3 by 1.6 by 0.6 cm fluid collection deep to the midline incision. There is no rim enhancement of this collection. There is a trace amount of free fluid in the abdomen. There is no free air. Musculoskeletal. No acute displaced fractures. Review of the MIP images confirms the above findings. IMPRESSION: 1. No acute pulmonary embolism. 2. Trace bilateral pleural effusions with atelectasis. 3. Cardiomegaly with biatrial enlargement. There are findings of mild interstitial edema. 4. Mildly dilated hyperenhancing loops of small bowel in the left mid abdomen may represent ileus or enteritis. 5.  Fluid collection deep to the midline incision superior to the umbilicus as detailed above. This is favored to represent a postoperative seroma or hematoma but has increased in size from prior study. A developing abscess is not excluded. 6. Trace amount of free fluid in the abdomen. 7. Fibroid uterus. 8. Fat containing lesion abutting the dome of the urinary bladder favored to represent an area of fat necrosis. Aortic Atherosclerosis (ICD10-I70.0). Electronically Signed   By: Katherine Mantle M.D.   On: 03/29/2020 00:03   DG Chest Port 1 View  Result Date: 03/28/2020 CLINICAL DATA:  Questionable sepsis, sharp 5/10 chest pain, shortness of breath history of bowel obstruction, now post subtotal colectomy EXAM: PORTABLE CHEST 1 VIEW COMPARISON:  Radiograph 03/19/2020 FINDINGS: There is diffuse hazy interstitial opacity in the lungs with a mid to lower lung predominance and some faint septal thickening as well as central vascular congestion. More hazy opacities present in the right infrahilar lung. Small bilateral effusions are present, likely diminished from comparison accounting for differences in technique. No pneumothorax. Cardiomegaly similar to prior counting for differences in technique. The osseous structures appear diffusely demineralized which may limit detection of small or nondisplaced fractures. No acute osseous abnormality or suspicious osseous lesion. Degenerative changes are present in the imaged spine and shoulders. Soft tissues unremarkable. Telemetry leads overlie the chest. IMPRESSION: 1. Appearance could suggest some mild CHF/volume overload with cardiomegaly and features of interstitial with trace bilateral effusions. 2. More hazy opacity in the right infrahilar lung is favored to reflect some developing alveolar edema though early airspace disease is not excluded in the setting of sepsis. Electronically Signed   By: Kreg Shropshire M.D.   On: 03/28/2020 22:16    Scheduled Meds: . amiodarone   200 mg Oral Daily  . apixaban  5 mg Oral BID  . Chlorhexidine Gluconate Cloth  6 each Topical Daily  . metoprolol succinate  25 mg Oral Daily  . midodrine  5 mg Oral TID WC   Continuous Infusions: . ampicillin-sulbactam (UNASYN) IV 3 g (03/30/20 1127)  . lactated ringers 50 mL/hr at 03/29/20 1135    Assessment/Plan:  1. Severe sepsis present on admission with hypotension.  Antibiotics switched over to Unasyn and treating the  bacteria that grew out from the abdominal wound(E coli and enterococcus).  Blood cultures and urine culture negative.  Patient also had low-grade fever tachycardia and elevated white blood cell count and hypotension on presentation.  White blood cell count trending better. 2. Atrial fibrillation with rapid ventricular response.  Continue Eliquis.  Restarted amiodarone yesterday and restarted Toprol low dose today.  Continue to titrate if able to do so. 3. Hypotension.  Patient on low-dose midodrine.  Will discontinue IV fluids this afternoon. 4. Hyponatremia improved with IV fluids 5.   Previous ruptured diveticulitis s/p colectomy and ileostomy     Code Status:     Code Status Orders  (From admission, onward)         Start     Ordered   03/29/20 0356  Full code  Continuous        03/29/20 0404        Code Status History    Date Active Date Inactive Code Status Order ID Comments User Context   03/11/2020 2235 03/21/2020 0011 DNR [id number]  Anselm Jungling, DO ED   12/04/2019 1919 12/07/2019 1936 Full Code [id number]  Lorretta Harp, MD Inpatient   03/17/2019 2019 03/20/2019 1956 Full Code [id number]  Mayo, Allyn Kenner, MD Inpatient   07/05/2017 0148 07/07/2017 1630 Full Code [id number]  Ihor Austin, MD Inpatient   Advance Care Planning Activity    Advance Directive Documentation     Most Recent Value  Type of Advance Directive Healthcare Power of Attorney  Pre-existing out of facility DNR order (yellow form or pink MOST form) --  "MOST" Form in Place? --      Family Communication: left message for son Disposition Plan: Status is: Inpatient   Dispo: The patient is from: Home              Anticipated d/c is to: rehab              Anticipated d/c date is: few more days in the hospital              Patient currently on iv abx treating sepsis  Time spent:  [employer]

## 2020-03-30 NOTE — Progress Notes (Signed)
[facility] SURGICAL PROGRESS NOTE  Hospital Day(s): 1.   Interval History:  Patient seen and examined no acute events or new complaints overnight.  Patient reports she is doing well, awaiting her breakfast tray She denied any fever, chills, nausea, emesis, abdominal pain, chest pain, or SOB Leukocytosis now resolved, WBC 10.4K Renal function remains normal, sCr - 0.56, UO - 1.5L Ileostomy functioning well, 1.0L out in last 24 hours She is on regular diet Abx transitioned to Unasyn yesterday (10/28) No other complaints  Vital signs in last 24 hours: [min-max] current  Temp:  [97.6 F (36.4 C)-97.9 F (36.6 C)] 97.6 F (36.4 C) (10/29 0340) Pulse Rate:  [52-111] 106 (10/29 0340) Resp:  [15-25] 18 (10/29 0340) BP: (85-110)/(51-87) 102/74 (10/29 0340) SpO2:  [75 %-100 %] 96 % (10/29 0340)     Height: 5\' 5"  (165.1 cm) Weight: 64.3 kg BMI (Calculated): 23.59   Intake/Output last 2 shifts:  10/28 0701 - 10/29 0700 In: 1509.5 [P.O.:120; I.V.:739.5; IV Piggyback:650] Out: 2225 [Urine:1150; Stool:1075]   Physical Exam:  Constitutional: alert, cooperative and no distress  Respiratory: breathing non-labored at rest  Cardiovascular: regular rate and sinus rhythm  Gastrointestinal: soft, non-tender, non-distended, no rebound/guarding. 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 The inferior 3-4 cm are open and healing via secondary intention, the wound bed is nearly 100% granulation tissue, there is packing place, no surrounding erythema or drainage throughout the remaining laparotomy. Steri-strips in place to remaining wound  Labs:  CBC Latest Ref Rng & Units 03/30/2020 03/29/2020 03/28/2020  WBC 4.0 - 10.5 K/uL 10.4 16.5(H) 17.4(H)  Hemoglobin 12.0 - 15.0 g/dL 03/30/2020) 5.8(K) 10.8(L)  Hematocrit 36 - 46 % 29.6(L) 29.3(L) 33.7(L)  Platelets 150 - 400 K/uL 267 271 359   CMP Latest Ref Rng & Units 03/30/2020 03/29/2020  03/28/2020  Glucose 70 - 99 mg/dL 03/30/2020) 99 84  BUN 8 - 23 mg/dL 8 9 13   Creatinine 0.44 - 1.00 mg/dL 338(S 5.05  Sodium 135 - 145 mmol/L 135 133(L) 129(L)  Potassium 3.5 - 5.1 mmol/L 3.7 3.7 4.3  Chloride 98 - 111 mmol/L 105 107 98  CO2 22 - 32 mmol/L 22 18(L) 18(L)  Calcium 8.9 - 10.3 mg/dL 8.1(L) 7.7(L) 8.4(L)  Total Protein 6.5 - 8.1 g/dL - - 6.9  Total Bilirubin 0.3 - 1.2 mg/dL - - 0.7  Alkaline Phos 38 - 126 U/L - - 112  AST 15 - 41 U/L - - 41  ALT 0 - 44 U/L - - 40     Imaging studies: No new pertinent imaging studies   Assessment/Plan: (ICD-10's: A41.9) 74 y.o. female admitted with presumed sepsis of unclear source. She does not have any gross intra-abdominal process or complication to explain her presentation. She was found to have midline wound fluid collections to the superior aspect however I suspect this may be more representative of a seroma given her exam and I am not convinced this is her septic source.    - Continue local wound care: Pack with iodoform gauze daily, cover with gauze and secure              - Continue ABx; switched to Unasyn on 10/28; narrow for home based on Cx 10/24   - Monitor ileostomy function             - Monitor abdominal examination             - Monitor leukocytosis; improving    -  No further interventions at this time from surgical standpoint              - Further management per primary service; we will follow peripherally for now, follow up as scheduled outpatient, we will be available if needed  All of the above findings and recommendations were discussed with the patient, and the medical team, and all of patient's questions were answered to her expressed satisfaction.  -- Lynden Oxford, PA-C [facility] 03/30/2020, 7:04 AM [phone] M-F: 7am - 4pm

## 2020-03-30 NOTE — Evaluation (Signed)
Physical Therapy Evaluation Patient Details Name: [name] MRN: [id number] DOB: 1946/05/24 Today's Date: 03/30/2020   History of Present Illness  presented to ER secondary to chest pain, SOB; admitted for management of severe sepsis with noted post-op fluid collection (seroma vs hematoma vs abscess).  s/p colectomy (03/12/20) due to obstruction related to diverticulitis; complicated by post-op infection.  Clinical Impression  Upon evaluation, patient alert and oriented to basic information; follows simple commands and agreeable to session with min cuing from therapist.  Per faces scale, abdominal pain approx 6/10; meds requested/received per RN during session.  Patient generally weak and deconditioned due to acute illness and persistent abdominal pain; however, no focal weakness appreciated.  Able to complete bed mobility with min assist; sit/stand, basic transfers and short-distance gait (5') with RW, cga/min assist.  Demonstrates slow, shuffling steps; forwared flexed posture with mod reliance no RW.  Additional distance deferred per patient request due to pain and visitor present Would benefit from skilled PT to address above deficits and promote optimal return to PLOF.; recommend transition to STR upon discharge from acute hospitalization.     Follow Up Recommendations SNF    Equipment Recommendations       Recommendations for Other Services       Precautions / Restrictions Precautions Precautions: Fall Precaution Comments: R quadrant ostomy; laparotomy abdominal wound Restrictions Weight Bearing Restrictions: No      Mobility  Bed Mobility Overal bed mobility: Needs Assistance Bed Mobility: Supine to Sit Rolling: Supervision   Supine to sit: Min assist     General bed mobility comments: assist for truncal elevation; heavy use of bedrails, slow and guarded with movement transition    Transfers Overall transfer level: Needs assistance Equipment used: Rolling  walker (2 wheeled) Transfers: Sit to/from Stand Sit to Stand: Min assist         General transfer comment: cuing for hand placement; broad BOS, hesitant for forward trunk lean due to pain  Ambulation/Gait Ambulation/Gait assistance: Min guard Gait Distance (Feet): 5 Feet Assistive device: Rolling walker (2 wheeled)       General Gait Details: slow, shuffling steps; forwared flexed posture with mod reliance no RW.  Additional distance deferred per patient request due to pain and visitor present  Stairs            Wheelchair Mobility    Modified Rankin (Stroke Patients Only)       Balance Overall balance assessment: Needs assistance Sitting-balance support: No upper extremity supported;Feet supported Sitting balance-Leahy Scale: Good     Standing balance support: Bilateral upper extremity supported Standing balance-Leahy Scale: Fair                               Pertinent Vitals/Pain Pain Assessment: Faces Faces Pain Scale: Hurts even more Pain Location: abdominal area Pain Descriptors / Indicators: Grimacing;Guarding Pain Intervention(s): Limited activity within patient's tolerance;Monitored during session;Repositioned;Patient requesting pain meds-RN notified;RN gave pain meds during session    Home Living Family/patient expects to be discharged to:: Private residence Living Arrangements: Alone Available Help at Discharge: Family;Available PRN/intermittently Type of Home: Apartment       Home Layout: One level Home Equipment: Cane - single point      Prior Function Level of Independence: Independent with assistive device(s)         Comments: Uses SPC for community ambulation, no falls reported. Does not drive. Family assists with groceries once a month and  clipping pt toenails. Does not require assist for other ADL/IADL. Members of pt's church live in her apartment and come by to check on her daily.     Hand Dominance   Dominant  Hand: Right    Extremity/Trunk Assessment   Upper Extremity Assessment Upper Extremity Assessment: Overall WFL for tasks assessed    Lower Extremity Assessment Lower Extremity Assessment: Generalized weakness (grossly 4-/5 throughout; generally guarded due to abdominal pain with movement.  Reports tenderness in bilat feet due to dry skin)       Communication   Communication: No difficulties  Cognition Arousal/Alertness: Awake/alert Behavior During Therapy: WFL for tasks assessed/performed Overall Cognitive Status: Within Functional Limits for tasks assessed                                        General Comments      Exercises Other Exercises Other Exercises: Educated in role of PT and progressive mobility; introduced techniques/cuing for safety with transfers and mobility tasks; encouraged use of BSC (versus purewick) for toileting needs.  Patient voiced udnerstanding; will continue to reinforce   Assessment/Plan    PT Assessment Patient needs continued PT services  PT Problem List Decreased strength;Decreased mobility;Decreased activity tolerance;Cardiopulmonary status limiting activity;Decreased balance;Pain       PT Treatment Interventions Gait training;Functional mobility training;Therapeutic activities;Therapeutic exercise;Balance training;Neuromuscular re-education;Patient/family education;DME instruction    PT Goals (Current goals can be found in the Care Plan section)  Acute Rehab PT Goals Patient Stated Goal: to get my strength back and be able to return home PT Goal Formulation: With patient Time For Goal Achievement: 04/13/20 Potential to Achieve Goals: Good    Frequency Min 2X/week   Barriers to discharge        Co-evaluation               AM-PAC PT "6 Clicks" Mobility  Outcome Measure Help needed turning from your back to your side while in a flat bed without using bedrails?: None Help needed moving from lying on your back to  sitting on the side of a flat bed without using bedrails?: A Little Help needed moving to and from a bed to a chair (including a wheelchair)?: A Little Help needed standing up from a chair using your arms (e.g., wheelchair or bedside chair)?: A Little Help needed to walk in hospital room?: A Little Help needed climbing 3-5 steps with a railing? : A Lot 6 Click Score: 18    End of Session Equipment Utilized During Treatment: Gait belt Activity Tolerance: Patient tolerated treatment well Patient left: in chair;with call bell/phone within reach;with chair alarm set Nurse Communication: Mobility status PT Visit Diagnosis: Unsteadiness on feet (R26.81);Muscle weakness (generalized) (M62.81)    Time: 5631-4970 PT Time Calculation (min) (ACUTE ONLY): 27 min   Charges:   PT Evaluation $PT Eval Moderate Complexity: 1 Mod PT Treatments $Therapeutic Activity: 8-22 mins        Hisako Bugh H. Manson Passey, PT, DPT, NCS 03/30/20, 2:46 PM [phone]

## 2020-03-31 DIAGNOSIS — I5032 Chronic diastolic (congestive) heart failure: Secondary | ICD-10-CM

## 2020-03-31 MED ORDER — METOPROLOL SUCCINATE ER 50 MG PO TB24
50.0000 mg | ORAL_TABLET | Freq: Two times a day (BID) | ORAL | Status: DC
  Administered 2020-04-01 – 2020-04-03 (×5): 50 mg via ORAL
  Filled 2020-03-31 (×5): qty 1

## 2020-03-31 MED ORDER — METOPROLOL SUCCINATE ER 50 MG PO TB24
50.0000 mg | ORAL_TABLET | Freq: Every day | ORAL | Status: DC
  Administered 2020-03-31: 50 mg via ORAL

## 2020-03-31 MED ORDER — SODIUM CHLORIDE 0.9 % IV SOLN
INTRAVENOUS | Status: DC | PRN
  Administered 2020-03-31: 250 mL via INTRAVENOUS

## 2020-03-31 NOTE — TOC Progression Note (Signed)
Transition of Care [facility]) - Progression Note    Patient Details  Name: [name] MRN: [id number] Date of Birth: 1945-08-26  Transition of Care [facility]) CM/SW Contact  Bing Quarry, RN Phone Number: 03/31/2020, 12:38 PM  Clinical Narrative:   Insurance Authorization submitted to [employer] 1230 03/31/20. Expected discharge  04/02/20. Gabriel Cirri RN CM    Expected Discharge Plan: Skilled Nursing Facility Barriers to Discharge: Continued Medical Work up  Expected Discharge Plan and Services Expected Discharge Plan: Skilled Nursing Facility     Post Acute Care Choice: Skilled Nursing Facility Living arrangements for the past 2 months: Single Family Home                                       Social Determinants of Health (SDOH) Interventions    Readmission Risk Interventions Readmission Risk Prevention Plan 03/30/2020  Transportation Screening Complete  PCP or Specialist Appt within 3-5 Days Complete  HRI or Home Care Consult Complete  Social Work Consult for Recovery Care Planning/Counseling Complete  Palliative Care Screening Not Applicable  Medication Review [employer]) Complete  Some recent data might be hidden

## 2020-03-31 NOTE — Progress Notes (Signed)
Patient ID: [name], female   DOB: 09/19/1945, 74 y.o.   MRN: [id number] [facility] PROGRESS NOTE  [name] WUJ:[id number] DOB: 07/02/1945 DOA: 03/28/2020 PCP: Mickel FuchsWroth, Thomas H, MD  HPI/Subjective: Patient states that she is feeling a little bit better.  Still has a little bit of abdominal pain.  Still has some dryness on her skin on her feet which bothers her.  Heart rate still variable.  Objective: Vitals:   03/31/20 1304 03/31/20 1543  BP:  112/84  Pulse: (!) 101 89  Resp:  16  Temp:  98.1 F (36.7 C)  SpO2:  98%    Intake/Output Summary (Last 24 hours) at 03/31/2020 1632 Last data filed at 03/31/2020 1500 Gross per 24 hour  Intake 855.26 ml  Output 350 ml  Net 505.26 ml   Filed Weights   03/29/20 0550  Weight: 64.3 kg    ROS: Review of Systems  Respiratory: Negative for cough and shortness of breath.   Cardiovascular: Negative for chest pain.  Gastrointestinal: Positive for abdominal pain. Negative for nausea and vomiting.   Exam: Physical Exam HENT:     Head: Normocephalic.     Mouth/Throat:     Pharynx: No oropharyngeal exudate.  Eyes:     General: Lids are normal.     Conjunctiva/sclera: Conjunctivae normal.     Pupils: Pupils are equal, round, and reactive to light.  Cardiovascular:     Rate and Rhythm: Normal rate. Rhythm irregularly irregular.     Heart sounds: Normal heart sounds, S1 normal and S2 normal.  Pulmonary:     Breath sounds: Examination of the right-lower field reveals decreased breath sounds. Examination of the left-lower field reveals decreased breath sounds. Decreased breath sounds present. No wheezing, rhonchi or rales.  Abdominal:     Palpations: Abdomen is soft.     Tenderness: There is abdominal tenderness.  Musculoskeletal:     Right ankle: No swelling.     Left ankle: No swelling.  Skin:    General: Skin is warm.     Comments: Abdominal wound covered. Dry skin on feet.  Neurological:     Mental Status: She  is alert and oriented to person, place, and time.       Data Reviewed: Basic Metabolic Panel: Recent Labs  Lab 03/25/20 1701 03/28/20 2150 03/29/20 0431 03/30/20 0414  NA 131* 129* 133* 135  K 4.6 4.3 3.7 3.7  CL 102 98 107 105  CO2 18* 18* 18* 22  GLUCOSE 100* 84 99 112*  BUN 8 13 9 8   CREATININE 0.74 0.53 0.48 0.56  CALCIUM 8.4* 8.4* 7.7* 8.1*   Liver Function Tests: Recent Labs  Lab 03/28/20 2150  AST 41  ALT 40  ALKPHOS 112  BILITOT 0.7  PROT 6.9  ALBUMIN 3.1*   CBC: Recent Labs  Lab 03/25/20 1701 03/28/20 2150 03/29/20 0431 03/30/20 0414  WBC 15.2* 17.4* 16.5* 10.4  NEUTROABS 12.0* 14.9*  --   --   HGB 11.0* 10.8* 9.3* 9.6*  HCT 33.7* 33.7* 29.3* 29.6*  MCV 81.8 83.4 86.2 82.9  PLT 395 359 271 267   BNP (last 3 results) Recent Labs    12/07/19 0751 03/13/20 0303 03/13/20 1718  BNP 686.2* 981.4* 419.8*    CBG: Recent Labs  Lab 03/29/20 0546  GLUCAP 84    Recent Results (from the past 240 hour(s))  Aerobic/Anaerobic Culture (surgical/deep wound)     Status: None   Collection Time: 03/25/20 10:52 PM  Specimen: Abscess; Wound  Result Value Ref Range Status   Specimen Description   Final    ABSCESS Performed at [facility], [address], [place], [place] [address]    Special Requests   Final    NONE Performed at [facility], [address] [place], [place] [address]    Gram Stain   Final    ABUNDANT WBC PRESENT, PREDOMINANTLY PMN MODERATE GRAM POSITIVE COCCI MODERATE GRAM NEGATIVE RODS MODERATE GRAM POSITIVE RODS    Culture   Final    RARE ESCHERICHIA COLI RARE ENTEROCOCCUS FAECIUM FEW BACTEROIDES FRAGILIS BETA LACTAMASE POSITIVE Performed at [facility], [address] [place], [place] [address]    Report Status 03/29/2020 FINAL  Final   Organism ID, Bacteria ESCHERICHIA COLI  Final   Organism ID, Bacteria ENTEROCOCCUS FAECIUM  Final      Susceptibility   Escherichia coli - MIC*     AMPICILLIN >=32 RESISTANT Resistant     CEFAZOLIN <=4 SENSITIVE Sensitive     CEFEPIME <=0.12 SENSITIVE Sensitive     CEFTAZIDIME <=1 SENSITIVE Sensitive     CEFTRIAXONE <=0.25 SENSITIVE Sensitive     CIPROFLOXACIN >=4 RESISTANT Resistant     GENTAMICIN <=1 SENSITIVE Sensitive     IMIPENEM <=0.25 SENSITIVE Sensitive     TRIMETH/SULFA >=320 RESISTANT Resistant     AMPICILLIN/SULBACTAM 8 SENSITIVE Sensitive     PIP/TAZO <=4 SENSITIVE Sensitive     * RARE ESCHERICHIA COLI   Enterococcus faecium - MIC*    AMPICILLIN <=2 SENSITIVE Sensitive     VANCOMYCIN <=0.5 SENSITIVE Sensitive     GENTAMICIN SYNERGY SENSITIVE Sensitive     * RARE ENTEROCOCCUS FAECIUM  Blood Culture (routine x 2)     Status: None (Preliminary result)   Collection Time: 03/28/20  9:50 PM   Specimen: BLOOD  Result Value Ref Range Status   Specimen Description BLOOD BLOOD RIGHT FOREARM  Final   Special Requests   Final    BOTTLES DRAWN AEROBIC AND ANAEROBIC Blood Culture results may not be optimal due to an inadequate volume of blood received in culture bottles   Culture   Final    NO GROWTH 3 DAYS Performed at [facility], [address] [place], [place] [address]    Report Status PENDING  Incomplete  Blood Culture (routine x 2)     Status: None (Preliminary result)   Collection Time: 03/28/20  9:50 PM   Specimen: BLOOD  Result Value Ref Range Status   Specimen Description BLOOD BLOOD RIGHT FOREARM  Final   Special Requests   Final    BOTTLES DRAWN AEROBIC AND ANAEROBIC Blood Culture results may not be optimal due to an inadequate volume of blood received in culture bottles   Culture   Final    NO GROWTH 3 DAYS Performed at [facility], [address] [place], [place] [address]    Report Status PENDING  Incomplete  Urine culture     Status: None   Collection Time: 03/28/20 11:59 PM   Specimen: In/Out Cath Urine  Result Value Ref Range Status   Specimen Description   Final    IN/OUT  CATH URINE Performed at [facility], [address], [place], [place] [address]    Special Requests   Final    NONE Performed at [facility], [address], [place], [place] [address]    Culture   Final    NO GROWTH Performed at [facility], [address], [place], [place] [address]    Report Status 03/30/2020 FINAL  Final  Respiratory Panel by RT PCR (Flu A&B, Covid) - Nasopharyngeal Swab     Status: None   Collection Time: 03/28/20 11:59 PM   Specimen: Nasopharyngeal Swab  Result Value Ref Range Status   SARS Coronavirus 2 by RT PCR NEGATIVE NEGATIVE Final    Comment: (NOTE) SARS-CoV-2 target nucleic acids are NOT DETECTED.  The SARS-CoV-2 RNA is generally detectable in upper respiratoy specimens during the acute phase of infection. The lowest concentration of SARS-CoV-2 viral copies this assay can detect is 131 copies/mL. A negative result does not preclude SARS-Cov-2 infection and should not be used as the sole basis for treatment or other patient management decisions. A negative result may occur with  improper specimen collection/handling, submission of specimen other than nasopharyngeal swab, presence of viral mutation(s) within the areas targeted by this assay, and inadequate number of viral copies (<131 copies/mL). A negative result must be combined with clinical observations, patient history, and epidemiological information. The expected result is Negative.  Fact Sheet for Patients:  https://www.moore.com/  Fact Sheet for Healthcare Providers:  https://www.young.biz/  This test is no t yet approved or cleared by the [place] FDA and  has been authorized for detection and/or diagnosis of SARS-CoV-2 by FDA under an Emergency Use Authorization (EUA). This EUA will remain  in effect (meaning this test can be used) for the duration of the COVID-19 declaration under Section 564(b)(1) of the Act, 21  U.S.C. section 360bbb-3(b)(1), unless the authorization is terminated or revoked sooner.     Influenza A by PCR NEGATIVE NEGATIVE Final   Influenza B by PCR NEGATIVE NEGATIVE Final    Comment: (NOTE) The Xpert Xpress SARS-CoV-2/FLU/RSV assay is intended as an aid in  the diagnosis of influenza from Nasopharyngeal swab specimens and  should not be used as a sole basis for treatment. Nasal washings and  aspirates are unacceptable for Xpert Xpress SARS-CoV-2/FLU/RSV  testing.  Fact Sheet for Patients: https://www.moore.com/  Fact Sheet for Healthcare Providers: https://www.young.biz/  This test is not yet approved or cleared by the [place] FDA and  has been authorized for detection and/or diagnosis of SARS-CoV-2 by  FDA under an Emergency Use Authorization (EUA). This EUA will remain  in effect (meaning this test can be used) for the duration of the  Covid-19 declaration under Section 564(b)(1) of the Act, 21  U.S.C. section 360bbb-3(b)(1), unless the authorization is  terminated or revoked. Performed at [facility], [address] [place], [place] [address]   MRSA PCR Screening     Status: None   Collection Time: 03/29/20  5:47 AM   Specimen: Nasopharyngeal  Result Value Ref Range Status   MRSA by PCR NEGATIVE NEGATIVE Final    Comment:        The GeneXpert MRSA Assay (FDA approved for NASAL specimens only), is one component of a comprehensive MRSA colonization surveillance program. It is not intended to diagnose MRSA infection nor to guide or monitor treatment for MRSA infections. Performed at [facility], [address] [place], [place] [address]   Gastrointestinal Panel by PCR , Stool     Status: None   Collection Time: 03/29/20  7:52 AM   Specimen: Stool  Result Value Ref Range Status   Campylobacter species NOT DETECTED NOT DETECTED Final   Plesimonas shigelloides NOT DETECTED NOT DETECTED Final    Salmonella species NOT DETECTED NOT  DETECTED Final   Yersinia enterocolitica NOT DETECTED NOT DETECTED Final   Vibrio species NOT DETECTED NOT DETECTED Final   Vibrio cholerae NOT DETECTED NOT DETECTED Final   Enteroaggregative E coli (EAEC) NOT DETECTED NOT DETECTED Final   Enteropathogenic E coli (EPEC) NOT DETECTED NOT DETECTED Final   Enterotoxigenic E coli (ETEC) NOT DETECTED NOT DETECTED Final   Shiga like toxin producing E coli (STEC) NOT DETECTED NOT DETECTED Final   Shigella/Enteroinvasive E coli (EIEC) NOT DETECTED NOT DETECTED Final   Cryptosporidium NOT DETECTED NOT DETECTED Final   Cyclospora cayetanensis NOT DETECTED NOT DETECTED Final   Entamoeba histolytica NOT DETECTED NOT DETECTED Final   Giardia lamblia NOT DETECTED NOT DETECTED Final   Adenovirus F40/41 NOT DETECTED NOT DETECTED Final   Astrovirus NOT DETECTED NOT DETECTED Final   Norovirus GI/GII NOT DETECTED NOT DETECTED Final   Rotavirus A NOT DETECTED NOT DETECTED Final   Sapovirus (I, II, IV, and V) NOT DETECTED NOT DETECTED Final    Comment: Performed at [facility], [address] [place], [place] [address]  C Difficile Quick Screen w PCR reflex     Status: None   Collection Time: 03/29/20  7:52 AM   Specimen: Stool  Result Value Ref Range Status   C Diff antigen NEGATIVE NEGATIVE Final   C Diff toxin NEGATIVE NEGATIVE Final   C Diff interpretation No C. difficile detected.  Final    Comment: Performed at [facility], [address] [place], [place] [address]      Scheduled Meds: . amiodarone  200 mg Oral Daily  . apixaban  5 mg Oral BID  . Chlorhexidine Gluconate Cloth  6 each Topical Daily  . hydrocerin   Topical BID  . metoprolol succinate  50 mg Oral Daily  . midodrine  5 mg Oral TID WC   Continuous Infusions: . sodium chloride 10 mL/hr at 03/31/20 1500  . ampicillin-sulbactam (UNASYN) IV Stopped (03/31/20 1246)    Assessment/Plan:  1. Severe sepsis present on  admission with hypotension.  Continue Unasyn which would cover wound organisms of E. coli and Enterococcus.  Blood cultures and urine cultures currently negative.  White blood count trending better. 2. Chronic atrial fibrillation with rapid ventricular response.  On Eliquis for anticoagulation.  Continue amiodarone.  Increase Toprol dose today and will increase to twice daily dosing since heart rate did go up to 130. 3. Hypotension.  Currently on low-dose midodrine.  Discontinue IV fluids. 4. Hyponatremia.  This has improved with IV fluid hydration.  Now in the normal range at 135. 5. 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 Chronic diastolic congestive heart failure.  Currently no signs of heart failure fluid stopped.     Code Status:     Code Status Orders  (From admission, onward)         Start     Ordered   03/29/20 0356  Full code  Continuous        03/29/20 0404        Code Status History    Date Active Date Inactive Code Status Order ID Comments User Context   03/11/2020 2235 03/21/2020 0011 DNR [id number]  Anselm Jungling, DO ED   12/04/2019 1919 12/07/2019 1936 Full Code [id number]  Lorretta Harp, MD Inpatient   03/17/2019 2019 03/20/2019 1956 Full Code [id number]  Mayo, Allyn Kenner, MD Inpatient   07/05/2017 0148 07/07/2017 1630 Full Code [id number]  Ihor Austin, MD Inpatient  Advance Care Planning Activity    Advance Directive Documentation     Most Recent Value  Type of Advance Directive Healthcare Power of Attorney  Pre-existing out of facility DNR order (yellow form or pink MOST form) --  "MOST" Form in Place? --     Family Communication: Spoke with son on the phone Disposition Plan: Status is: Inpatient  Dispo: The patient is from: Rehab              Anticipated d/c is to: Rehab              Anticipated d/c date is: Potentially 04/02/2020              Patient currently adjusting medications for atrial fibrillation with rapid ventricular response  and hypotension.  Consultants:  Cardiology  Antibiotics:  Unasyn  Time spent: 28 minutes  [employer]

## 2020-04-01 MED ORDER — AMIODARONE HCL 200 MG PO TABS
200.0000 mg | ORAL_TABLET | Freq: Once | ORAL | Status: AC
  Administered 2020-04-01: 200 mg via ORAL
  Filled 2020-04-01: qty 1

## 2020-04-01 MED ORDER — FUROSEMIDE 20 MG PO TABS
20.0000 mg | ORAL_TABLET | Freq: Once | ORAL | Status: AC
  Administered 2020-04-01: 20 mg via ORAL
  Filled 2020-04-01: qty 1

## 2020-04-01 MED ORDER — MIDODRINE HCL 5 MG PO TABS
2.5000 mg | ORAL_TABLET | Freq: Three times a day (TID) | ORAL | Status: DC
  Administered 2020-04-01 – 2020-04-03 (×6): 2.5 mg via ORAL
  Filled 2020-04-01 (×6): qty 1

## 2020-04-01 MED ORDER — DILTIAZEM HCL ER COATED BEADS 180 MG PO CP24
180.0000 mg | ORAL_CAPSULE | Freq: Every day | ORAL | Status: DC
  Administered 2020-04-01 – 2020-04-02 (×2): 180 mg via ORAL
  Filled 2020-04-01 (×3): qty 1

## 2020-04-01 NOTE — Progress Notes (Signed)
CCMD notified this nurse of pt's heart rate sustaining 130's-140's. Notified Dr Hilton Sinclair. Orders received to give metoprolol 5 mg iv and midodrine 2.5 mg po. Heart rate was 105 apically,, blood pressure was 112/74. Medication was not administered at this time. MD notified by this Clinical research associate. Pt is sitting up in bed eating lunch with no complaints. Will continue to monitor heart rate

## 2020-04-01 NOTE — Plan of Care (Signed)
  Problem: Education: Goal: Knowledge of General Education information will improve Description Including pain rating scale, medication(s)/side effects and non-pharmacologic comfort measures Outcome: Progressing   Problem: Health Behavior/Discharge Planning: Goal: Ability to manage health-related needs will improve Outcome: Progressing   

## 2020-04-01 NOTE — Progress Notes (Signed)
[facility] Cardiology    SUBJECTIVE: Patient states he returned reasonably well status post diverticulitis requiring colectomy and colostomy placement patient now has a wound infection healing slowly being packed getting antibiotic therapy.  Denies any palpitations or tachycardia   Vitals:   03/31/20 1543 03/31/20 2306 04/01/20 0744 04/01/20 0759  BP: 112/84 113/76 (!) 110/98 120/83  Pulse: 89 96 (!) 120 (!) 122  Resp: 16 16 17 16   Temp: 98.1 F (36.7 C) 97.7 F (36.5 C) 98.2 F (36.8 C) 98.1 F (36.7 C)  TempSrc: Oral Oral Oral Oral  SpO2: 98% 98% 99% 97%  Weight:      Height:         Intake/Output Summary (Last 24 hours) at 04/01/2020 1205 Last data filed at 04/01/2020 1033 Gross per 24 hour  Intake 1349.96 ml  Output 1400 ml  Net -50.04 ml      PHYSICAL EXAM  General: Well developed, well nourished, in no acute distress HEENT:  Normocephalic and atramatic Neck:  No JVD.  Lungs: Clear bilaterally to auscultation and percussion. Heart: Irregular irregular. Normal S1 and S2 without gallops or murmurs.  Abdomen: Bowel sounds are positive, abdomen soft and non-tender colostomy in place incision healing there is still some packing Msk:  Back normal, normal gait. Normal strength and tone for age. Extremities: No clubbing, cyanosis or edema.   Neuro: Alert and oriented X 3. Psych:  Good affect, responds appropriately   LABS: Basic Metabolic Panel: Recent Labs    03/30/20 0414  NA 135  K 3.7  CL 105  CO2 22  GLUCOSE 112*  BUN 8  CREATININE 0.56  CALCIUM 8.1*   Liver Function Tests: No results for input(s): AST, ALT, ALKPHOS, BILITOT, PROT, ALBUMIN in the last 72 hours. No results for input(s): LIPASE, AMYLASE in the last 72 hours. CBC: Recent Labs    03/30/20 0414  WBC 10.4  HGB 9.6*  HCT 29.6*  MCV 82.9  PLT 267   Cardiac Enzymes: No results for input(s): CKTOTAL, CKMB, CKMBINDEX, TROPONINI in the last 72 hours. BNP: Invalid input(s):  POCBNP D-Dimer: No results for input(s): DDIMER in the last 72 hours. Hemoglobin A1C: No results for input(s): HGBA1C in the last 72 hours. Fasting Lipid Panel: No results for input(s): CHOL, HDL, LDLCALC, TRIG, CHOLHDL, LDLDIRECT in the last 72 hours. Thyroid Function Tests: No results for input(s): TSH, T4TOTAL, T3FREE, THYROIDAB in the last 72 hours.  Invalid input(s): FREET3 Anemia Panel: No results for input(s): VITAMINB12, FOLATE, FERRITIN, TIBC, IRON, RETICCTPCT in the last 72 hours.  No results found.   Echo preserved left ventricular function 55%  TELEMETRY: Atrial fibrillation rate of about 100:  ASSESSMENT AND PLAN:  Principal Problem:   Severe sepsis (HCC) Active Problems:   Atrial fibrillation with RVR (HCC)   Hypertension   Hypothyroidism   Hyponatremia   Chronic diastolic heart failure (HCC)    Possible Abscess of postoperative wound of abdominal wall   S/P laparotomy 03/12/20    History of CVA (cerebrovascular accident)   Hypotension    Plan Wound infection initially with sepsis on antibiotic therapy improving slowly Atrial fibrillation rate reasonably controlled on amiodarone we will add digoxin if needed for rate management Continue hypertensive control Previous CVA no residual hemiparesis Continue conservative cardiac input at this stage   05/12/20, MD, 04/01/2020 12:05 PM

## 2020-04-01 NOTE — Progress Notes (Addendum)
   04/01/20 0759  Assess: MEWS Score  Temp 98.1 F (36.7 C)  BP 120/83  Pulse Rate (!) 122  Resp 16  SpO2 97 %  O2 Device Room Air  Assess: MEWS Score  MEWS Temp 0  MEWS Systolic 0  MEWS Pulse 2  MEWS RR 0  MEWS LOC 0  MEWS Score 2  MEWS Score Color Yellow  Assess: if the MEWS score is Yellow or Red  Were vital signs taken at a resting state? Yes  Focused Assessment No change from prior assessment  Early Detection of Sepsis Score *See Row Information* Low  MEWS guidelines implemented *See Row Information* No, previously yellow, continue vital signs every 4 hours   Pt with continuous elevating heart rate. MD aware. Meds/Treatments in place. Will continue to monitor.

## 2020-04-01 NOTE — Progress Notes (Signed)
Patient ID: [name], female   DOB: 1946/05/07, 74 y.o.   MRN: [id number] [facility] PROGRESS NOTE  [name] WUJ:[id number] DOB: 20-Oct-1945 DOA: 03/28/2020 PCP: Mickel Fuchs, MD  HPI/Subjective: Patient did have a little shortness of breath today.  Otherwise feeling better than when she came in.  Still has a little abdominal soreness.  Heart rate still going fast.  Was admitted with severe sepsis.  Objective: Vitals:   04/01/20 1300 04/01/20 1310  BP:    Pulse: (!) 103 (!) 109  Resp:    Temp:    SpO2:      Intake/Output Summary (Last 24 hours) at 04/01/2020 1351 Last data filed at 04/01/2020 1033 Gross per 24 hour  Intake 1349.96 ml  Output 1400 ml  Net -50.04 ml   Filed Weights   03/29/20 0550  Weight: 64.3 kg    ROS: Review of Systems  Respiratory: Positive for shortness of breath.   Cardiovascular: Negative for chest pain.  Gastrointestinal: Negative for abdominal pain, nausea and vomiting.   Exam: Physical Exam HENT:     Head: Normocephalic.     Mouth/Throat:     Pharynx: No oropharyngeal exudate.  Eyes:     General: Lids are normal.     Conjunctiva/sclera: Conjunctivae normal.     Pupils: Pupils are equal, round, and reactive to light.  Cardiovascular:     Rate and Rhythm: Tachycardia present. Rhythm regularly irregular.     Heart sounds: S1 normal and S2 normal. Murmur heard.  Systolic murmur is present with a grade of 3/6.   Pulmonary:     Breath sounds: Examination of the right-lower field reveals decreased breath sounds and rhonchi. Examination of the left-lower field reveals decreased breath sounds and rhonchi. Decreased breath sounds and rhonchi present. No wheezing or rales.  Abdominal:     Palpations: Abdomen is soft.     Tenderness: There is no abdominal tenderness.  Musculoskeletal:     Right lower leg: No swelling.     Left lower leg: No swelling.  Skin:    General: Skin is warm.     Findings: No rash.  Neurological:      Mental Status: She is alert and oriented to person, place, and time.       Data Reviewed: Basic Metabolic Panel: Recent Labs  Lab 03/25/20 1701 03/28/20 2150 03/29/20 0431 03/30/20 0414  NA 131* 129* 133* 135  K 4.6 4.3 3.7 3.7  CL 102 98 107 105  CO2 18* 18* 18* 22  GLUCOSE 100* 84 99 112*  BUN 8 13 9 8   CREATININE 0.74 0.53 0.48 0.56  CALCIUM 8.4* 8.4* 7.7* 8.1*   Liver Function Tests: Recent Labs  Lab 03/28/20 2150  AST 41  ALT 40  ALKPHOS 112  BILITOT 0.7  PROT 6.9  ALBUMIN 3.1*   CBC: Recent Labs  Lab 03/25/20 1701 03/28/20 2150 03/29/20 0431 03/30/20 0414  WBC 15.2* 17.4* 16.5* 10.4  NEUTROABS 12.0* 14.9*  --   --   HGB 11.0* 10.8* 9.3* 9.6*  HCT 33.7* 33.7* 29.3* 29.6*  MCV 81.8 83.4 86.2 82.9  PLT 395 359 271 267   BNP (last 3 results) Recent Labs    12/07/19 0751 03/13/20 0303 03/13/20 1718  BNP 686.2* 981.4* 419.8*    CBG: Recent Labs  Lab 03/29/20 0546  GLUCAP 84    Recent Results (from the past 240 hour(s))  Aerobic/Anaerobic Culture (surgical/deep wound)     Status: None  Collection Time: 03/25/20 10:52 PM   Specimen: Abscess; Wound  Result Value Ref Range Status   Specimen Description   Final    ABSCESS Performed at [facility], [address] [place], [place] [address]    Special Requests   Final    NONE Performed at [facility], [address] [place], [place] [address]    Gram Stain   Final    ABUNDANT WBC PRESENT, PREDOMINANTLY PMN MODERATE GRAM POSITIVE COCCI MODERATE GRAM NEGATIVE RODS MODERATE GRAM POSITIVE RODS    Culture   Final    RARE ESCHERICHIA COLI RARE ENTEROCOCCUS FAECIUM FEW BACTEROIDES FRAGILIS BETA LACTAMASE POSITIVE Performed at [facility], [address], [place], [place] [address]    Report Status 03/29/2020 FINAL  Final   Organism ID, Bacteria ESCHERICHIA COLI  Final   Organism ID, Bacteria ENTEROCOCCUS FAECIUM  Final      Susceptibility   Escherichia  coli - MIC*    AMPICILLIN >=32 RESISTANT Resistant     CEFAZOLIN <=4 SENSITIVE Sensitive     CEFEPIME <=0.12 SENSITIVE Sensitive     CEFTAZIDIME <=1 SENSITIVE Sensitive     CEFTRIAXONE <=0.25 SENSITIVE Sensitive     CIPROFLOXACIN >=4 RESISTANT Resistant     GENTAMICIN <=1 SENSITIVE Sensitive     IMIPENEM <=0.25 SENSITIVE Sensitive     TRIMETH/SULFA >=320 RESISTANT Resistant     AMPICILLIN/SULBACTAM 8 SENSITIVE Sensitive     PIP/TAZO <=4 SENSITIVE Sensitive     * RARE ESCHERICHIA COLI   Enterococcus faecium - MIC*    AMPICILLIN <=2 SENSITIVE Sensitive     VANCOMYCIN <=0.5 SENSITIVE Sensitive     GENTAMICIN SYNERGY SENSITIVE Sensitive     * RARE ENTEROCOCCUS FAECIUM  Blood Culture (routine x 2)     Status: None (Preliminary result)   Collection Time: 03/28/20  9:50 PM   Specimen: BLOOD  Result Value Ref Range Status   Specimen Description BLOOD BLOOD RIGHT FOREARM  Final   Special Requests   Final    BOTTLES DRAWN AEROBIC AND ANAEROBIC Blood Culture results may not be optimal due to an inadequate volume of blood received in culture bottles   Culture   Final    NO GROWTH 4 DAYS Performed at [facility], [address], [place], [place] [address]    Report Status PENDING  Incomplete  Blood Culture (routine x 2)     Status: None (Preliminary result)   Collection Time: 03/28/20  9:50 PM   Specimen: BLOOD  Result Value Ref Range Status   Specimen Description BLOOD BLOOD RIGHT FOREARM  Final   Special Requests   Final    BOTTLES DRAWN AEROBIC AND ANAEROBIC Blood Culture results may not be optimal due to an inadequate volume of blood received in culture bottles   Culture   Final    NO GROWTH 4 DAYS Performed at [facility], [address], [place], [place] [address]    Report Status PENDING  Incomplete  Urine culture     Status: None   Collection Time: 03/28/20 11:59 PM   Specimen: In/Out Cath Urine  Result Value Ref Range Status   Specimen Description    Final    IN/OUT CATH URINE Performed at [facility], [address], [place], [place] [address]    Special Requests   Final    NONE Performed at [facility], [address] [place], [place] [address]    Culture   Final  NO GROWTH Performed at [facility], [address], [place], [place] [address]    Report Status 03/30/2020 FINAL  Final  Respiratory Panel by RT PCR (Flu A&B, Covid) - Nasopharyngeal Swab     Status: None   Collection Time: 03/28/20 11:59 PM   Specimen: Nasopharyngeal Swab  Result Value Ref Range Status   SARS Coronavirus 2 by RT PCR NEGATIVE NEGATIVE Final    Comment: (NOTE) SARS-CoV-2 target nucleic acids are NOT DETECTED.  The SARS-CoV-2 RNA is generally detectable in upper respiratoy specimens during the acute phase of infection. The lowest concentration of SARS-CoV-2 viral copies this assay can detect is 131 copies/mL. A negative result does not preclude SARS-Cov-2 infection and should not be used as the sole basis for treatment or other patient management decisions. A negative result may occur with  improper specimen collection/handling, submission of specimen other than nasopharyngeal swab, presence of viral mutation(s) within the areas targeted by this assay, and inadequate number of viral copies (<131 copies/mL). A negative result must be combined with clinical observations, patient history, and epidemiological information. The expected result is Negative.  Fact Sheet for Patients:  https://www.moore.com/  Fact Sheet for Healthcare Providers:  https://www.young.biz/  This test is no t yet approved or cleared by the [place] FDA and  has been authorized for detection and/or diagnosis of SARS-CoV-2 by FDA under an Emergency Use Authorization (EUA). This EUA will remain  in effect (meaning this test can be used) for the duration of the COVID-19 declaration under Section  564(b)(1) of the Act, 21 U.S.C. section 360bbb-3(b)(1), unless the authorization is terminated or revoked sooner.     Influenza A by PCR NEGATIVE NEGATIVE Final   Influenza B by PCR NEGATIVE NEGATIVE Final    Comment: (NOTE) The Xpert Xpress SARS-CoV-2/FLU/RSV assay is intended as an aid in  the diagnosis of influenza from Nasopharyngeal swab specimens and  should not be used as a sole basis for treatment. Nasal washings and  aspirates are unacceptable for Xpert Xpress SARS-CoV-2/FLU/RSV  testing.  Fact Sheet for Patients: https://www.moore.com/  Fact Sheet for Healthcare Providers: https://www.young.biz/  This test is not yet approved or cleared by the [place] FDA and  has been authorized for detection and/or diagnosis of SARS-CoV-2 by  FDA under an Emergency Use Authorization (EUA). This EUA will remain  in effect (meaning this test can be used) for the duration of the  Covid-19 declaration under Section 564(b)(1) of the Act, 21  U.S.C. section 360bbb-3(b)(1), unless the authorization is  terminated or revoked. Performed at [facility], [address] [place], [place] [address]   MRSA PCR Screening     Status: None   Collection Time: 03/29/20  5:47 AM   Specimen: Nasopharyngeal  Result Value Ref Range Status   MRSA by PCR NEGATIVE NEGATIVE Final    Comment:        The GeneXpert MRSA Assay (FDA approved for NASAL specimens only), is one component of a comprehensive MRSA colonization surveillance program. It is not intended to diagnose MRSA infection nor to guide or monitor treatment for MRSA infections. Performed at [facility], [address] [place], [place] [address]   Gastrointestinal Panel by PCR , Stool     Status: None   Collection Time: 03/29/20  7:52 AM   Specimen: Stool  Result Value Ref Range Status   Campylobacter species NOT DETECTED NOT DETECTED Final   Plesimonas shigelloides NOT  DETECTED NOT DETECTED Final  Salmonella species NOT DETECTED NOT DETECTED Final   Yersinia enterocolitica NOT DETECTED NOT DETECTED Final   Vibrio species NOT DETECTED NOT DETECTED Final   Vibrio cholerae NOT DETECTED NOT DETECTED Final   Enteroaggregative E coli (EAEC) NOT DETECTED NOT DETECTED Final   Enteropathogenic E coli (EPEC) NOT DETECTED NOT DETECTED Final   Enterotoxigenic E coli (ETEC) NOT DETECTED NOT DETECTED Final   Shiga like toxin producing E coli (STEC) NOT DETECTED NOT DETECTED Final   Shigella/Enteroinvasive E coli (EIEC) NOT DETECTED NOT DETECTED Final   Cryptosporidium NOT DETECTED NOT DETECTED Final   Cyclospora cayetanensis NOT DETECTED NOT DETECTED Final   Entamoeba histolytica NOT DETECTED NOT DETECTED Final   Giardia lamblia NOT DETECTED NOT DETECTED Final   Adenovirus F40/41 NOT DETECTED NOT DETECTED Final   Astrovirus NOT DETECTED NOT DETECTED Final   Norovirus GI/GII NOT DETECTED NOT DETECTED Final   Rotavirus A NOT DETECTED NOT DETECTED Final   Sapovirus (I, II, IV, and V) NOT DETECTED NOT DETECTED Final    Comment: Performed at [facility], [address] [place], [place] [address]  C Difficile Quick Screen w PCR reflex     Status: None   Collection Time: 03/29/20  7:52 AM   Specimen: Stool  Result Value Ref Range Status   C Diff antigen NEGATIVE NEGATIVE Final   C Diff toxin NEGATIVE NEGATIVE Final   C Diff interpretation No C. difficile detected.  Final    Comment: Performed at [facility], [address] [place], [place] [address]     Studies: No results found.  Scheduled Meds: . amiodarone  200 mg Oral Daily  . apixaban  5 mg Oral BID  . Chlorhexidine Gluconate Cloth  6 each Topical Daily  . diltiazem  180 mg Oral Daily  . hydrocerin   Topical BID  . metoprolol succinate  50 mg Oral BID  . midodrine  2.5 mg Oral TID WC   Continuous Infusions: . sodium chloride 10 mL/hr at 03/31/20 1500  . ampicillin-sulbactam  (UNASYN) IV 3 g (04/01/20 1302)    Assessment/Plan:  1. Severe sepsis, present on admission with hypotension.  On Unasyn that would cover the organisms growing out of the wound (E. coli and Enterococcus).  Blood cultures and urine cultures negative on this hospital stay.  White blood cell count trended in the normal range.  Check labs again tomorrow. 2. Chronic atrial fibrillation with rapid ventricular response.  On Eliquis for anticoagulation.  Will give an extra dose of amiodarone since heart rate up in the 130s today.  Continue Toprol XL to 50 mg twice a day.  Added low-dose Cardizem CD today also. 3. Hypotension.  Decrease midodrine dose down to 2.5 mg 3 times daily. 4. Hyponatremia this has improved with IV fluids. 5. 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 Chronic diastolic congestive heart failure 1 dose of Lasix today.  On Toprol.  Blood pressure too low for any other medications.     Code Status:     Code Status Orders  (From admission, onward)         Start     Ordered   03/29/20 0356  Full code  Continuous        03/29/20 0404        Code Status History    Date Active Date Inactive Code Status Order ID Comments User Context   03/11/2020 2235 03/21/2020 0011 DNR [id number]  Anselm Jungling, DO ED  12/04/2019 1919 12/07/2019 1936 Full Code [id number]  Lorretta HarpNiu, Xilin, MD Inpatient   03/17/2019 2019 03/20/2019 1956 Full Code [id number]  Campbell StallMayo, Katy Dodd, MD Inpatient   07/05/2017 0148 07/07/2017 1630 Full Code [id number]  Ihor AustinPyreddy, Pavan, MD Inpatient   Advance Care Planning Activity    Advance Directive Documentation     Most Recent Value  Type of Advance Directive Healthcare Power of Attorney  Pre-existing out of facility DNR order (yellow form or pink MOST form) --  "MOST" Form in Place? --     Family Communication: Spoke with the patient's son on the phone Disposition Plan: Status is: Inpatient  Dispo: The patient is from: Rehab               Anticipated d/c is to: Rehab              Anticipated d/c date is: Potential disposition 04/02/2020 versus 04/03/2020 based on heart rate              Patient currently having episodes where her heart rate goes up into the 130s. Titrating medications today added Cardizem CD and giving an extra dose of amiodarone today.  Antibiotics:  Unasyn  Time spent: 26 minutes  Lenzie Sandler [employer]  [facility]

## 2020-04-02 ENCOUNTER — Encounter: Payer: Self-pay | Admitting: Internal Medicine

## 2020-04-02 DIAGNOSIS — R531 Weakness: Secondary | ICD-10-CM

## 2020-04-02 DIAGNOSIS — I5033 Acute on chronic diastolic (congestive) heart failure: Secondary | ICD-10-CM

## 2020-04-02 DIAGNOSIS — I482 Chronic atrial fibrillation, unspecified: Secondary | ICD-10-CM

## 2020-04-02 LAB — RESPIRATORY PANEL BY RT PCR (FLU A&B, COVID)
Influenza A by PCR: NEGATIVE
Influenza B by PCR: NEGATIVE
SARS Coronavirus 2 by RT PCR: NEGATIVE

## 2020-04-02 LAB — CBC
HCT: 31.2 % — ABNORMAL LOW (ref 36.0–46.0)
Hemoglobin: 10.1 g/dL — ABNORMAL LOW (ref 12.0–15.0)
MCH: 26.7 pg (ref 26.0–34.0)
MCHC: 32.4 g/dL (ref 30.0–36.0)
MCV: 82.5 fL (ref 80.0–100.0)
Platelets: 255 10*3/uL (ref 150–400)
RBC: 3.78 MIL/uL — ABNORMAL LOW (ref 3.87–5.11)
RDW: 16.6 % — ABNORMAL HIGH (ref 11.5–15.5)
WBC: 10.4 10*3/uL (ref 4.0–10.5)
nRBC: 0 % (ref 0.0–0.2)

## 2020-04-02 LAB — BASIC METABOLIC PANEL
Anion gap: 9 (ref 5–15)
BUN: 8 mg/dL (ref 8–23)
CO2: 23 mmol/L (ref 22–32)
Calcium: 8.3 mg/dL — ABNORMAL LOW (ref 8.9–10.3)
Chloride: 102 mmol/L (ref 98–111)
Creatinine, Ser: 0.61 mg/dL (ref 0.44–1.00)
GFR, Estimated: >60 mL/min (ref >60)
Glucose, Bld: 108 mg/dL — ABNORMAL HIGH (ref 70–99)
Potassium: 4 mmol/L (ref 3.5–5.1)
Sodium: 134 mmol/L — ABNORMAL LOW (ref 135–145)

## 2020-04-02 LAB — CULTURE, BLOOD (ROUTINE X 2)
Culture: NO GROWTH
Culture: NO GROWTH

## 2020-04-02 MED ORDER — AMOXICILLIN-POT CLAVULANATE 875-125 MG PO TABS
1.0000 | ORAL_TABLET | Freq: Two times a day (BID) | ORAL | Status: DC
  Administered 2020-04-02 – 2020-04-03 (×2): 1 via ORAL
  Filled 2020-04-02 (×2): qty 1

## 2020-04-02 MED ORDER — FUROSEMIDE 10 MG/ML IJ SOLN
40.0000 mg | Freq: Once | INTRAMUSCULAR | Status: AC
  Administered 2020-04-02: 40 mg via INTRAVENOUS
  Filled 2020-04-02: qty 4

## 2020-04-02 NOTE — Progress Notes (Signed)
Patient ID: [name], female   DOB: 08/17/1945, 74 y.o.   MRN: [id number] [facility] PROGRESS NOTE  [name] DOB: January 18, 1946 DOA: 03/28/2020 PCP: Mickel Fuchs, MD  HPI/Subjective: Patient stated she got short of breath last night and they put oxygen on her.  She was asking whether or not she can have the oxygen at the rehab facility.  Patient does feel better than when she came in.  Heart rate today better with adjustment of medication yesterday.  Objective: Vitals:   04/01/20 2332 04/02/20 0807  BP: 109/78 102/68  Pulse: 85 75  Resp: 16 18  Temp: (!) 97.5 F (36.4 C) (!) 97.5 F (36.4 C)  SpO2: 94% 98%    Intake/Output Summary (Last 24 hours) at 04/02/2020 1509 Last data filed at 04/02/2020 1345 Gross per 24 hour  Intake 840 ml  Output 3075 ml  Net -2235 ml   Filed Weights   03/29/20 0550  Weight: 64.3 kg    ROS: Review of Systems  Respiratory: Positive for cough and shortness of breath.   Cardiovascular: Negative for chest pain.  Gastrointestinal: Negative for abdominal pain, nausea and vomiting.   Exam: Physical Exam HENT:     Head: Normocephalic.     Mouth/Throat:     Pharynx: No oropharyngeal exudate.  Eyes:     General: Lids are normal.     Conjunctiva/sclera: Conjunctivae normal.     Pupils: Pupils are equal, round, and reactive to light.  Cardiovascular:     Rate and Rhythm: Normal rate. Rhythm irregularly irregular.     Heart sounds: Normal heart sounds, S1 normal and S2 normal.  Pulmonary:     Breath sounds: Examination of the right-lower field reveals decreased breath sounds and rales. Examination of the left-lower field reveals decreased breath sounds and rales. Decreased breath sounds and rales present. No wheezing or rhonchi.  Abdominal:     Palpations: Abdomen is soft.     Tenderness: There is no abdominal tenderness.  Musculoskeletal:     Right lower leg: Swelling present.     Left lower leg: Swelling present.   Skin:    General: Skin is warm.     Findings: No rash.  Neurological:     Mental Status: She is alert and oriented to person, place, and time.       Data Reviewed: Basic Metabolic Panel: Recent Labs  Lab 03/28/20 2150 03/29/20 0431 03/30/20 0414 04/02/20 0354  NA 129* 133* 135 134*  K 4.3 3.7 3.7 4.0  CL 98 107 105 102  CO2 18* 18* 22 23  GLUCOSE 84 99 112* 108*  BUN CREATININE 0.53 0.48 0.56 0.61  CALCIUM 8.4* 7.7* 8.1* 8.3*   Liver Function Tests: Recent Labs  Lab 03/28/20 2150  AST 41  ALT 40  ALKPHOS 112  BILITOT 0.7  PROT 6.9  ALBUMIN 3.1*   CBC: Recent Labs  Lab 03/28/20 2150 03/29/20 0431 03/30/20 0414 04/02/20 0354  WBC 17.4* 16.5* 10.4 10.4  NEUTROABS 14.9*  --   --   --   HGB 10.8* 9.3* 9.6* 10.1*  HCT 33.7* 29.3* 29.6* 31.2*  MCV 83.4 86.2 82.9 82.5  PLT 359 271 267 255   BNP (last 3 results) Recent Labs    12/07/19 0751 03/13/20 0303 03/13/20 1718  BNP 686.2* 981.4* 419.8*    CBG: Recent Labs  Lab 03/29/20 0546  GLUCAP 84    Recent Results (from the past 240  hour(s))  Aerobic/Anaerobic Culture (surgical/deep wound)     Status: None   Collection Time: 03/25/20 10:52 PM   Specimen: Abscess; Wound  Result Value Ref Range Status   Specimen Description   Final    ABSCESS Performed at [facility], [address] [place], [place] [address]    Special Requests   Final    NONE Performed at [facility], [address] [place], [place] [address]    Gram Stain   Final    ABUNDANT WBC PRESENT, PREDOMINANTLY PMN MODERATE GRAM POSITIVE COCCI MODERATE GRAM NEGATIVE RODS MODERATE GRAM POSITIVE RODS    Culture   Final    RARE ESCHERICHIA COLI RARE ENTEROCOCCUS FAECIUM FEW BACTEROIDES FRAGILIS BETA LACTAMASE POSITIVE Performed at [facility], [address], [place], [place] [address]    Report Status 03/29/2020 FINAL  Final   Organism ID, Bacteria ESCHERICHIA COLI  Final   Organism ID,  Bacteria ENTEROCOCCUS FAECIUM  Final      Susceptibility   Escherichia coli - MIC*    AMPICILLIN >=32 RESISTANT Resistant     CEFAZOLIN <=4 SENSITIVE Sensitive     CEFEPIME <=0.12 SENSITIVE Sensitive     CEFTAZIDIME <=1 SENSITIVE Sensitive     CEFTRIAXONE <=0.25 SENSITIVE Sensitive     CIPROFLOXACIN >=4 RESISTANT Resistant     GENTAMICIN <=1 SENSITIVE Sensitive     IMIPENEM <=0.25 SENSITIVE Sensitive     TRIMETH/SULFA >=320 RESISTANT Resistant     AMPICILLIN/SULBACTAM 8 SENSITIVE Sensitive     PIP/TAZO <=4 SENSITIVE Sensitive     * RARE ESCHERICHIA COLI   Enterococcus faecium - MIC*    AMPICILLIN <=2 SENSITIVE Sensitive     VANCOMYCIN <=0.5 SENSITIVE Sensitive     GENTAMICIN SYNERGY SENSITIVE Sensitive     * RARE ENTEROCOCCUS FAECIUM  Blood Culture (routine x 2)     Status: None   Collection Time: 03/28/20  9:50 PM   Specimen: BLOOD  Result Value Ref Range Status   Specimen Description BLOOD BLOOD RIGHT FOREARM  Final   Special Requests   Final    BOTTLES DRAWN AEROBIC AND ANAEROBIC Blood Culture results may not be optimal due to an inadequate volume of blood received in culture bottles   Culture   Final    NO GROWTH 5 DAYS Performed at [facility], [address] [place], [place] [address]    Report Status 04/02/2020 FINAL  Final  Blood Culture (routine x 2)     Status: None   Collection Time: 03/28/20  9:50 PM   Specimen: BLOOD  Result Value Ref Range Status   Specimen Description BLOOD BLOOD RIGHT FOREARM  Final   Special Requests   Final    BOTTLES DRAWN AEROBIC AND ANAEROBIC Blood Culture results may not be optimal due to an inadequate volume of blood received in culture bottles   Culture   Final    NO GROWTH 5 DAYS Performed at [facility], [address] [place], [place] [address]    Report Status 04/02/2020 FINAL  Final  Urine culture     Status: None   Collection Time: 03/28/20 11:59 PM   Specimen: In/Out Cath Urine  Result Value Ref  Range Status   Specimen Description   Final    IN/OUT CATH URINE Performed at [facility], [address], [place], [place] [address]    Special Requests   Final    NONE Performed at [facility], [address] [place], [place] [address]    Culture   Final    NO GROWTH Performed at [facility], [address] [place], [place] [address]    Report Status 03/30/2020 FINAL  Final  Respiratory Panel by RT PCR (Flu A&B, Covid) - Nasopharyngeal Swab     Status: None   Collection Time: 03/28/20 11:59 PM   Specimen: Nasopharyngeal Swab  Result Value Ref Range Status   SARS Coronavirus 2 by RT PCR NEGATIVE NEGATIVE Final    Comment: (NOTE) SARS-CoV-2 target nucleic acids are NOT DETECTED.  The SARS-CoV-2 RNA is generally detectable in upper respiratoy specimens during the acute phase of infection. The lowest concentration of SARS-CoV-2 viral copies this assay can detect is 131 copies/mL. A negative result does not preclude SARS-Cov-2 infection and should not be used as the sole basis for treatment or other patient management decisions. A negative result may occur with  improper specimen collection/handling, submission of specimen other than nasopharyngeal swab, presence of viral mutation(s) within the areas targeted by this assay, and inadequate number of viral copies (<131 copies/mL). A negative result must be combined with clinical observations, patient history, and epidemiological information. The expected result is Negative.  Fact Sheet for Patients:  https://www.moore.com/https://www.fda.gov/media/142436/download  Fact Sheet for Healthcare Providers:  https://www.young.biz/https://www.fda.gov/media/142435/download  This test is no t yet approved or cleared by the [place] FDA and  has been authorized for detection and/or diagnosis of SARS-CoV-2 by FDA under an Emergency Use Authorization (EUA). This EUA will remain  in effect (meaning this test can be used) for the duration of  the COVID-19 declaration under Section 564(b)(1) of the Act, 21 U.S.C. section 360bbb-3(b)(1), unless the authorization is terminated or revoked sooner.     Influenza A by PCR NEGATIVE NEGATIVE Final   Influenza B by PCR NEGATIVE NEGATIVE Final    Comment: (NOTE) The Xpert Xpress SARS-CoV-2/FLU/RSV assay is intended as an aid in  the diagnosis of influenza from Nasopharyngeal swab specimens and  should not be used as a sole basis for treatment. Nasal washings and  aspirates are unacceptable for Xpert Xpress SARS-CoV-2/FLU/RSV  testing.  Fact Sheet for Patients: https://www.moore.com/https://www.fda.gov/media/142436/download  Fact Sheet for Healthcare Providers: https://www.young.biz/https://www.fda.gov/media/142435/download  This test is not yet approved or cleared by the [place] FDA and  has been authorized for detection and/or diagnosis of SARS-CoV-2 by  FDA under an Emergency Use Authorization (EUA). This EUA will remain  in effect (meaning this test can be used) for the duration of the  Covid-19 declaration under Section 564(b)(1) of the Act, 21  U.S.C. section 360bbb-3(b)(1), unless the authorization is  terminated or revoked. Performed at [facility], [address] [place], [place] [address]   MRSA PCR Screening     Status: None   Collection Time: 03/29/20  5:47 AM   Specimen: Nasopharyngeal  Result Value Ref Range Status   MRSA by PCR NEGATIVE NEGATIVE Final    Comment:        The GeneXpert MRSA Assay (FDA approved for NASAL specimens only), is one component of a comprehensive MRSA colonization surveillance program. It is not intended to diagnose MRSA infection nor to guide or monitor treatment for MRSA infections. Performed at [facility], [address] [place], [place] [address]   Gastrointestinal Panel by PCR , Stool     Status: None   Collection Time: 03/29/20  7:52 AM   Specimen: Stool  Result Value Ref Range Status   Campylobacter species NOT DETECTED NOT  DETECTED  Final   Plesimonas shigelloides NOT DETECTED NOT DETECTED Final   Salmonella species NOT DETECTED NOT DETECTED Final   Yersinia enterocolitica NOT DETECTED NOT DETECTED Final   Vibrio species NOT DETECTED NOT DETECTED Final   Vibrio cholerae NOT DETECTED NOT DETECTED Final   Enteroaggregative E coli (EAEC) NOT DETECTED NOT DETECTED Final   Enteropathogenic E coli (EPEC) NOT DETECTED NOT DETECTED Final   Enterotoxigenic E coli (ETEC) NOT DETECTED NOT DETECTED Final   Shiga like toxin producing E coli (STEC) NOT DETECTED NOT DETECTED Final   Shigella/Enteroinvasive E coli (EIEC) NOT DETECTED NOT DETECTED Final   Cryptosporidium NOT DETECTED NOT DETECTED Final   Cyclospora cayetanensis NOT DETECTED NOT DETECTED Final   Entamoeba histolytica NOT DETECTED NOT DETECTED Final   Giardia lamblia NOT DETECTED NOT DETECTED Final   Adenovirus F40/41 NOT DETECTED NOT DETECTED Final   Astrovirus NOT DETECTED NOT DETECTED Final   Norovirus GI/GII NOT DETECTED NOT DETECTED Final   Rotavirus A NOT DETECTED NOT DETECTED Final   Sapovirus (I, II, IV, and V) NOT DETECTED NOT DETECTED Final    Comment: Performed at [facility], [address] [place], [place] [address]  C Difficile Quick Screen w PCR reflex     Status: None   Collection Time: 03/29/20  7:52 AM   Specimen: Stool  Result Value Ref Range Status   C Diff antigen NEGATIVE NEGATIVE Final   C Diff toxin NEGATIVE NEGATIVE Final   C Diff interpretation No C. difficile detected.  Final    Comment: Performed at [facility], [address] [place], [place] [address]  Respiratory Panel by RT PCR (Flu A&B, Covid) - Nasopharyngeal Swab     Status: None   Collection Time: 04/02/20  1:29 PM   Specimen: Nasopharyngeal Swab  Result Value Ref Range Status   SARS Coronavirus 2 by RT PCR NEGATIVE NEGATIVE Final    Comment: (NOTE) SARS-CoV-2 target nucleic acids are NOT DETECTED.  The SARS-CoV-2 RNA is generally detectable in upper  respiratoy specimens during the acute phase of infection. The lowest concentration of SARS-CoV-2 viral copies this assay can detect is 131 copies/mL. A negative result does not preclude SARS-Cov-2 infection and should not be used as the sole basis for treatment or other patient management decisions. A negative result may occur with  improper specimen collection/handling, submission of specimen other than nasopharyngeal swab, presence of viral mutation(s) within the areas targeted by this assay, and inadequate number of viral copies (<131 copies/mL). A negative result must be combined with clinical observations, patient history, and epidemiological information. The expected result is Negative.  Fact Sheet for Patients:  https://www.moore.com/  Fact Sheet for Healthcare Providers:  https://www.young.biz/  This test is no t yet approved or cleared by the [place] FDA and  has been authorized for detection and/or diagnosis of SARS-CoV-2 by FDA under an Emergency Use Authorization (EUA). This EUA will remain  in effect (meaning this test can be used) for the duration of the COVID-19 declaration under Section 564(b)(1) of the Act, 21 U.S.C. section 360bbb-3(b)(1), unless the authorization is terminated or revoked sooner.     Influenza A by PCR NEGATIVE NEGATIVE Final   Influenza B by PCR NEGATIVE NEGATIVE Final    Comment: (NOTE) The Xpert Xpress SARS-CoV-2/FLU/RSV assay is intended as an aid in  the diagnosis of influenza from Nasopharyngeal swab specimens and  should not be used as a sole basis for treatment. Nasal washings and  aspirates  are unacceptable for Xpert Xpress SARS-CoV-2/FLU/RSV  testing.  Fact Sheet for Patients: https://www.moore.com/  Fact Sheet for Healthcare Providers: https://www.young.biz/  This test is not yet approved or cleared by the [place] FDA and  has been  authorized for detection and/or diagnosis of SARS-CoV-2 by  FDA under an Emergency Use Authorization (EUA). This EUA will remain  in effect (meaning this test can be used) for the duration of the  Covid-19 declaration under Section 564(b)(1) of the Act, 21  U.S.C. section 360bbb-3(b)(1), unless the authorization is  terminated or revoked. Performed at [facility], [address] [place], [place] [address]      Scheduled Meds: . amiodarone  200 mg Oral Daily  . amoxicillin-clavulanate  1 tablet Oral Q12H  . apixaban  5 mg Oral BID  . Chlorhexidine Gluconate Cloth  6 each Topical Daily  . diltiazem  180 mg Oral Daily  . hydrocerin   Topical BID  . metoprolol succinate  50 mg Oral BID  . midodrine  2.5 mg Oral TID WC   Continuous Infusions: . sodium chloride 10 mL/hr at 03/31/20 1500    Assessment/Plan:  1. Acute on chronic diastolic congestive heart failure.  Will give a dose of IV Lasix this morning.  Continue Toprol.  Patient was placed on oxygen overnight.  Will check an overnight oximetry to see if she qualifies for oxygen at night. 2. Severe sepsis, present on admission with hypotension.  I am treating the organisms that are growing out of the wound culture from the seroma.  E. coli and Enterococcus are covered by Unasyn.  Change antibiotics over to Augmentin. 3. Chronic atrial fibrillation with rapid ventricular response.  Heart rate better today on amiodarone, Toprol-XL and Cardizem CD 4. Chronic hypotension on low-dose midodrine 5. Hyponatremia improved with IV fluids 6. 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 Weakness. patient to go back to rehab      Code Status:     Code Status Orders  (From admission, onward)         Start     Ordered   03/29/20 0356  Full code  Continuous        03/29/20 0404        Code Status History    Date Active Date Inactive Code Status Order ID Comments User Context   03/11/2020 2235  03/21/2020 0011 DNR [id number]  Anselm Jungling, DO ED   12/04/2019 1919 12/07/2019 1936 Full Code [id number]  Lorretta Harp, MD Inpatient   03/17/2019 2019 03/20/2019 1956 Full Code [id number]  Mayo, Allyn Kenner, MD Inpatient   07/05/2017 0148 07/07/2017 1630 Full Code [id number]  Ihor Austin, MD Inpatient   Advance Care Planning Activity    Advance Directive Documentation     Most Recent Value  Type of Advance Directive Healthcare Power of Attorney  Pre-existing out of facility DNR order (yellow form or pink MOST form) --  "MOST" Form in Place? --     Family Communication: Spoke with son on the phone Disposition Plan: Status is: Inpatient  Dispo: The patient is from: Rehab              Anticipated d/c is to: Rehab              Anticipated d/c date is: Likely 04/03/2020              Patient currently where short of breath last night and treating with IV Lasix.  We will check  an overnight oximetry to see if she qualifies for oxygen at nighttime.  Time spent: 26 minutes  Nashiya [employer]

## 2020-04-02 NOTE — Care Management Important Message (Signed)
Important Message  Patient Details  Name: [name] MRN: [id number] Date of Birth: 1946-05-22   Medicare Important Message Given:  Yes     Olegario Messier A Diego Delancey 04/02/2020, 10:57 AM

## 2020-04-02 NOTE — Plan of Care (Signed)

## 2020-04-02 NOTE — Progress Notes (Signed)
ANTICOAGULATION CONSULT NOTE  Pharmacy Consult for restarting apixaban Indication: atrial fibrillation  Vital Signs: Temp: 97.5 F (36.4 C) (11/01 0807) Temp Source: Oral (11/01 0807) BP: 102/68 (11/01 0807) Pulse Rate: 75 (11/01 0807)  Labs: Recent Labs    04/02/20 0354  HGB 10.1*  HCT 31.2*  PLT 255  CREATININE 0.61    Estimated Creatinine Clearance: 53.8 mL/min (by C-G formula based on SCr of 0.61 mg/dL).   Medical History: Past Medical History:  Diagnosis Date  . Acute respiratory failure (HCC)    Secondary to aspiration pneumonia   . Altered mental status    Secondary to viral herpes simplex virus encephalitis  . Anemia   . Atrial fibrillation (HCC)   . Bilateral pneumonia   . CHF (congestive heart failure) (HCC)   . Dysphasia   . Encephalitis   . Hyperglycemia   . Hypertension   . Hyponatremia   . IBS (irritable bowel syndrome)   . Seizures (HCC)   . Stroke [facility])     Medications:  Scheduled:  . amiodarone  200 mg Oral Daily  . amoxicillin-clavulanate  1 tablet Oral Q12H  . apixaban  5 mg Oral BID  . Chlorhexidine Gluconate Cloth  6 each Topical Daily  . diltiazem  180 mg Oral Daily  . hydrocerin   Topical BID  . metoprolol succinate  50 mg Oral BID  . midodrine  2.5 mg Oral TID WC    Assessment: 74 y.o. 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 She was on apixaban PTA which was held on admission for a possible surgery which was deemed unnecessary.  Goal of Therapy:  Monitor platelets by anticoagulation protocol: Yes   Plan:   Continue apixaban 5 mg po BID  CBC at least every 3 days per protocol  Sonnia Strong R Sandeep Delagarza 04/02/2020,11:07 AM

## 2020-04-03 DIAGNOSIS — G4734 Idiopathic sleep related nonobstructive alveolar hypoventilation: Secondary | ICD-10-CM

## 2020-04-03 MED ORDER — DILTIAZEM HCL ER COATED BEADS 120 MG PO CP24
120.0000 mg | ORAL_CAPSULE | Freq: Every day | ORAL | Status: DC
  Administered 2020-04-03: 120 mg via ORAL
  Filled 2020-04-03: qty 1

## 2020-04-03 MED ORDER — METOPROLOL SUCCINATE ER 50 MG PO TB24: 50.0000 mg | ORAL_TABLET | Freq: Two times a day (BID) | ORAL | 0 refills | Status: AC

## 2020-04-03 MED ORDER — MIDODRINE HCL 2.5 MG PO TABS: 2.5000 mg | ORAL_TABLET | Freq: Three times a day (TID) | ORAL | 0 refills | Status: AC

## 2020-04-03 MED ORDER — FUROSEMIDE 20 MG PO TABS
10.0000 mg | ORAL_TABLET | Freq: Every day | ORAL | 0 refills | Status: DC
Start: 2020-04-03 — End: 2020-04-13

## 2020-04-03 MED ORDER — AMOXICILLIN-POT CLAVULANATE 875-125 MG PO TABS: 1.0000 | ORAL_TABLET | Freq: Two times a day (BID) | ORAL | 0 refills | Status: AC

## 2020-04-03 MED ORDER — FUROSEMIDE 20 MG PO TABS
10.0000 mg | ORAL_TABLET | Freq: Every day | ORAL | Status: DC
  Administered 2020-04-03: 10 mg via ORAL
  Filled 2020-04-03: qty 1

## 2020-04-03 MED ORDER — HYDROCERIN EX CREA: 1.0000 "application " | TOPICAL_CREAM | Freq: Two times a day (BID) | CUTANEOUS | 0 refills | Status: AC

## 2020-04-03 MED ORDER — DILTIAZEM HCL ER COATED BEADS 120 MG PO CP24
120.0000 mg | ORAL_CAPSULE | Freq: Every day | ORAL | 0 refills | Status: DC
Start: 2020-04-03 — End: 2020-04-24

## 2020-04-03 NOTE — Plan of Care (Signed)

## 2020-04-03 NOTE — Plan of Care (Signed)
Problem: Education: Goal: Knowledge of General Education information will improve Description: Including pain rating scale, medication(s)/side effects and non-pharmacologic comfort measures 04/03/2020 1017 by Fransico Setters, RN Outcome: Adequate for Discharge 04/03/2020 1016 by Fransico Setters, RN Outcome: Progressing   Problem: Health Behavior/Discharge Planning: Goal: Ability to manage health-related needs will improve 04/03/2020 1017 by Fransico Setters, RN Outcome: Adequate for Discharge 04/03/2020 1016 by Fransico Setters, RN Outcome: Progressing   Problem: Clinical Measurements: Goal: Ability to maintain clinical measurements within normal limits will improve 04/03/2020 1017 by Fransico Setters, RN Outcome: Adequate for Discharge 04/03/2020 1016 by Fransico Setters, RN Outcome: Progressing Goal: Will remain free from infection 04/03/2020 1017 by Fransico Setters, RN Outcome: Adequate for Discharge 04/03/2020 1016 by Fransico Setters, RN Outcome: Progressing Goal: Diagnostic test results will improve 04/03/2020 1017 by Fransico Setters, RN Outcome: Adequate for Discharge 04/03/2020 1016 by Fransico Setters, RN Outcome: Progressing Goal: Respiratory complications will improve 04/03/2020 1017 by Fransico Setters, RN Outcome: Adequate for Discharge 04/03/2020 1016 by Fransico Setters, RN Outcome: Progressing Goal: Cardiovascular complication will be avoided 04/03/2020 1017 by Fransico Setters, RN Outcome: Adequate for Discharge 04/03/2020 1016 by Fransico Setters, RN Outcome: Progressing   Problem: Activity: Goal: Risk for activity intolerance will decrease 04/03/2020 1017 by Fransico Setters, RN Outcome: Adequate for Discharge 04/03/2020 1016 by Fransico Setters, RN Outcome: Progressing   Problem: Nutrition: Goal: Adequate nutrition will be maintained 04/03/2020 1017 by Fransico Setters, RN Outcome: Adequate for Discharge 04/03/2020 1016 by Fransico Setters, RN Outcome: Progressing    Problem: Coping: Goal: Level of anxiety will decrease 04/03/2020 1017 by Fransico Setters, RN Outcome: Adequate for Discharge 04/03/2020 1016 by Fransico Setters, RN Outcome: Progressing   Problem: Elimination: Goal: Will not experience complications related to bowel motility 04/03/2020 1017 by Fransico Setters, RN Outcome: Adequate for Discharge 04/03/2020 1016 by Fransico Setters, RN Outcome: Progressing Goal: Will not experience complications related to urinary retention 04/03/2020 1017 by Fransico Setters, RN Outcome: Adequate for Discharge 04/03/2020 1016 by Fransico Setters, RN Outcome: Progressing   Problem: Pain Managment: Goal: General experience of comfort will improve 04/03/2020 1017 by Fransico Setters, RN Outcome: Adequate for Discharge 04/03/2020 1016 by Fransico Setters, RN Outcome: Progressing   Problem: Safety: Goal: Ability to remain free from injury will improve 04/03/2020 1017 by Fransico Setters, RN Outcome: Adequate for Discharge 04/03/2020 1016 by Fransico Setters, RN Outcome: Progressing   Problem: Skin Integrity: Goal: Risk for impaired skin integrity will decrease 04/03/2020 1017 by Fransico Setters, RN Outcome: Adequate for Discharge 04/03/2020 1016 by Fransico Setters, RN Outcome: Progressing   Problem: Education: Goal: Knowledge of General Education information will improve Description: Including pain rating scale, medication(s)/side effects and non-pharmacologic comfort measures 04/03/2020 1017 by Fransico Setters, RN Outcome: Adequate for Discharge 04/03/2020 1016 by Fransico Setters, RN Outcome: Progressing   Problem: Health Behavior/Discharge Planning: Goal: Ability to manage health-related needs will improve 04/03/2020 1017 by Fransico Setters, RN Outcome: Adequate for Discharge 04/03/2020 1016 by Fransico Setters, RN Outcome: Progressing   Problem: Clinical Measurements: Goal: Ability to maintain clinical measurements within normal limits will  improve 04/03/2020 1017 by Fransico Setters, RN Outcome: Adequate for Discharge 04/03/2020 1016 by Fransico Setters, RN Outcome: Progressing Goal: Will remain free from infection 04/03/2020 1017 by Fransico Setters, RN Outcome: Adequate for Discharge 04/03/2020 1016 by  Fransico Setters, RN Outcome: Progressing Goal: Diagnostic test results will improve 04/03/2020 1017 by Fransico Setters, RN Outcome: Adequate for Discharge 04/03/2020 1016 by Fransico Setters, RN Outcome: Progressing Goal: Respiratory complications will improve 04/03/2020 1017 by Fransico Setters, RN Outcome: Adequate for Discharge 04/03/2020 1016 by Fransico Setters, RN Outcome: Progressing Goal: Cardiovascular complication will be avoided 04/03/2020 1017 by Fransico Setters, RN Outcome: Adequate for Discharge 04/03/2020 1016 by Fransico Setters, RN Outcome: Progressing   Problem: Activity: Goal: Risk for activity intolerance will decrease 04/03/2020 1017 by Fransico Setters, RN Outcome: Adequate for Discharge 04/03/2020 1016 by Fransico Setters, RN Outcome: Progressing   Problem: Nutrition: Goal: Adequate nutrition will be maintained 04/03/2020 1017 by Fransico Setters, RN Outcome: Adequate for Discharge 04/03/2020 1016 by Fransico Setters, RN Outcome: Progressing   Problem: Coping: Goal: Level of anxiety will decrease 04/03/2020 1017 by Fransico Setters, RN Outcome: Adequate for Discharge 04/03/2020 1016 by Fransico Setters, RN Outcome: Progressing   Problem: Elimination: Goal: Will not experience complications related to bowel motility 04/03/2020 1017 by Fransico Setters, RN Outcome: Adequate for Discharge 04/03/2020 1016 by Fransico Setters, RN Outcome: Progressing Goal: Will not experience complications related to urinary retention 04/03/2020 1017 by Fransico Setters, RN Outcome: Adequate for Discharge 04/03/2020 1016 by Fransico Setters, RN Outcome: Progressing   Problem: Pain Managment: Goal: General experience of comfort  will improve 04/03/2020 1017 by Fransico Setters, RN Outcome: Adequate for Discharge 04/03/2020 1016 by Fransico Setters, RN Outcome: Progressing   Problem: Safety: Goal: Ability to remain free from injury will improve 04/03/2020 1017 by Fransico Setters, RN Outcome: Adequate for Discharge 04/03/2020 1016 by Fransico Setters, RN Outcome: Progressing   Problem: Skin Integrity: Goal: Risk for impaired skin integrity will decrease 04/03/2020 1017 by Fransico Setters, RN Outcome: Adequate for Discharge 04/03/2020 1016 by Fransico Setters, RN Outcome: Progressing

## 2020-04-03 NOTE — Discharge Summary (Signed)
[facility] [place] at [facility]   PATIENT NAME: [name]    MR#:  [id number]  DATE OF BIRTH:  01/02/1946  DATE OF ADMISSION:  03/28/2020 ADMITTING PHYSICIAN: Andris Baumann, MD  DATE OF DISCHARGE: 04/03/2020  PRIMARY CARE PHYSICIAN: Mickel Fuchs, MD    ADMISSION DIAGNOSIS:  Nonspecific chest pain [R07.9] Sepsis (HCC) [A41.9] Sepsis, due to unspecified organism, unspecified whether acute organ dysfunction present (HCC) [A41.9]  DISCHARGE DIAGNOSIS:  Principal Problem:   Severe sepsis (HCC) Active Problems:   Atrial fibrillation with RVR (HCC)   Hypertension   Hypothyroidism   Hyponatremia   Chronic diastolic heart failure (HCC)    Possible Abscess of postoperative wound of abdominal wall   S/P laparotomy 03/12/20    History of CVA (cerebrovascular accident)   Hypotension   Atrial fibrillation, chronic (HCC)   Weakness   SECONDARY DIAGNOSIS:   Past Medical History:  Diagnosis Date  . Acute respiratory failure (HCC)    Secondary to aspiration pneumonia   . Altered mental status    Secondary to viral herpes simplex virus encephalitis  . Anemia   . Atrial fibrillation (HCC)   . Bilateral pneumonia   . CHF (congestive heart failure) (HCC)   . Dysphasia   . Encephalitis   . Hyperglycemia   . Hypertension   . Hyponatremia   . IBS (irritable bowel syndrome)   . Seizures (HCC)   . Stroke [facility])     HOSPITAL COURSE:   1.  Severe sepsis, present on admission with hypotension.  I am treating the E. coli and Enterococcus that grew out of the seroma.  Patient was seen by surgery and no operative intervention.  Initially on Unasyn.  White blood cell count trended down.  We will give Augmentin for a few more days upon getting out of the hospital.  As per general surgery, pack abdominal wound with iodoform and cover with gauze daily.  Follow-up with general surgery in 2 weeks 2.  Chronic atrial fibrillation with rapid ventricular response.   Initially we had a hold rate controlling medication secondary to hypotension but then had to restart them one by one.  Currently on amiodarone 200 mg daily Toprol-XL 50 mg twice a day and Cardizem CD 120 mg daily with good control of heart rate.  Patient on Eliquis for anticoagulation. 3.  Acute on chronic diastolic congestive heart failure.  I did give 2 days of Lasix here in the hospital will change over to a low-dose oral Lasix 10 mg daily. 4.  Chronic hypotension on low-dose midodrine 2.5 mg 3 times a day.  Hopefully can get rid of this medication if blood pressure comes up.  Her blood pressure has been in the 100s. 5.  Hyponatremia.  Improved with IV fluids. 6.  Nocturnal hypoxia.  Does desaturate on overnight oximetry spent at least 8 minutes with pulse ox less than 88%.  Will give oxygen 2 L nasal cannula at night. 7.  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  Weakness.  Patient to go back to rehab today   DISCHARGE CONDITIONS:   Satisfactory  CONSULTS OBTAINED:  Treatment Team:  Alwyn Pea, MD  DRUG ALLERGIES:   Allergies  Allergen Reactions  . Prednisone Hives  . Predicort [Prednisolone Acetate]     HIVES,    DISCHARGE MEDICATIONS:   Allergies as of 04/03/2020      Reactions   Prednisone Hives   Predicort [prednisolone Acetate]  HIVES,      Medication List    STOP taking these medications   cephALEXin 500 MG capsule Commonly known as: KEFLEX   dicyclomine 10 MG capsule Commonly known as: BENTYL   guaiFENesin 600 MG 12 hr tablet Commonly known as: MUCINEX   polycarbophil 625 MG tablet Commonly known as: FIBERCON   traMADol 50 MG tablet Commonly known as: ULTRAM     TAKE these medications   acetaminophen 325 MG tablet Commonly known as: TYLENOL Take 650 mg by mouth every 6 (six) hours as needed.   amiodarone 200 MG tablet Commonly known as: PACERONE Take 1 tablet (200 mg total) by mouth daily.    amoxicillin-clavulanate 875-125 MG tablet Commonly known as: AUGMENTIN Take 1 tablet by mouth every 12 (twelve) hours for 5 days. What changed: when to take this   apixaban 5 MG Tabs tablet Commonly known as: ELIQUIS Take 1 tablet (5 mg total) by mouth 2 (two) times daily.   diltiazem 120 MG 24 hr capsule Commonly known as: CARDIZEM CD Take 1 capsule (120 mg total) by mouth daily. What changed:  medication strength how much to take   Ensure Max Protein Liqd Take 330 mLs (11 oz total) by mouth 2 (two) times daily.   folic acid 1 MG tablet Commonly known as: FOLVITE Take 1 mg by mouth daily. Per tube once daily   furosemide 20 MG tablet Commonly known as: LASIX Take 0.5 tablets (10 mg total) by mouth daily. What changed:  how much to take how to take this when to take this additional instructions   hydrocerin Crea Apply 1 application topically 2 (two) times daily.   hydrocortisone 2.5 % rectal cream Commonly known as: ANUSOL-HC Apply 1 application topically 2 (two) times daily as needed.   levothyroxine 75 MCG tablet Commonly known as: SYNTHROID Take 75 mcg by mouth daily before breakfast.   metoprolol succinate 50 MG 24 hr tablet Commonly known as: TOPROL-XL Take 1 tablet (50 mg total) by mouth 2 (two) times daily. Take with or immediately following a meal. What changed:  medication strength when to take this additional instructions   midodrine 2.5 MG tablet Commonly known as: PROAMATINE Take 1 tablet (2.5 mg total) by mouth 3 (three) times daily with meals.   multivitamin with minerals Tabs tablet Take 1 tablet by mouth daily.   omeprazole 20 MG capsule Commonly known as: PRILOSEC Take 20 mg by mouth daily.   potassium chloride 10 MEQ tablet Commonly known as: KLOR-CON Take 10 mEq by mouth daily.        DISCHARGE INSTRUCTIONS:   Follow-up Dr. At rehab 1 day. Follow-up general surgery 2 weeks  If you experience worsening of your admission  symptoms, develop shortness of breath, life threatening emergency, suicidal or homicidal thoughts you must seek medical attention immediately by calling 911 or calling your MD immediately  if symptoms less severe.  You Must read complete instructions/literature along with all the possible adverse reactions/side effects for all the Medicines you take and that have been prescribed to you. Take any new Medicines after you have completely understood and accept all the possible adverse reactions/side effects.   Please note  You were cared for by a hospitalist during your hospital stay. If you have any questions about your discharge medications or the care you received while you were in the hospital after you are discharged, you can call the unit and asked to speak with the hospitalist on call if the hospitalist that  took care of you is not available. Once you are discharged, your primary care physician will handle any further medical issues. Please note that NO REFILLS for any discharge medications will be authorized once you are discharged, as it is imperative that you return to your primary care physician (or establish a relationship with a primary care physician if you do not have one) for your aftercare needs so that they can reassess your need for medications and monitor your lab values.    Today   CHIEF COMPLAINT:   Chief Complaint  Patient presents with  . Chest Pain    HISTORY OF PRESENT ILLNESS:  Makenah Karas  is a 74 y.o. female came in initially with chest pain and found to have a low blood pressure and clinical sepsis.   VITAL SIGNS:  Blood pressure 103/77, pulse 90, temperature (!) 97.4 F (36.3 C), temperature source Oral, resp. rate 16, height 5\' 5"  (1.651 m), weight 64.3 kg, SpO2 95 %.  I/O:    Intake/Output Summary (Last 24 hours) at 04/03/2020 0810 Last data filed at 04/03/2020 0520 Gross per 24 hour  Intake 1020 ml  Output 2325 ml  Net -1305 ml    PHYSICAL  EXAMINATION:  GENERAL:  74 y.o.-year-old patient lying in the bed with no acute distress.  EYES: Pupils equal, round, reactive to light and accommodation. No scleral icterus. HEENT: Head atraumatic, normocephalic. Oropharynx and nasopharynx clear. .  LUNGS: Normal breath sounds bilaterally, no wheezing, rales,rhonchi or crepitation. No use of accessory muscles of respiration.  CARDIOVASCULAR: S1, S2 irregularly irregular.  Rate controlled 3-6 systolic murmur.  ABDOMEN: Soft, non-tender.  EXTREMITIES: Trace left lower extremity edema.  NEUROLOGIC: Cranial nerves II through XII are intact. Muscle strength 5/5 in all extremities. Sensation intact. Gait not checked.  PSYCHIATRIC: The patient is alert and oriented x 3.  SKIN: No obvious rash, lesion, or ulcer.   DATA REVIEW:   CBC Recent Labs  Lab 04/02/20 0354  WBC 10.4  HGB 10.1*  HCT 31.2*  PLT 255    Chemistries  Recent Labs  Lab 03/28/20 2150 03/29/20 0431 04/02/20 0354  NA 129*   < > 134*  K 4.3   < > 4.0  CL 98   < > 102  CO2 18*   < > 23  GLUCOSE 84   < > 108*  BUN 13   < > 8  CREATININE 0.53   < > 0.61  CALCIUM 8.4*   < > 8.3*  AST 41  --   --   ALT 40  --   --   ALKPHOS 112  --   --   BILITOT 0.7  --   --    < > = values in this interval not displayed.      Microbiology Results  Results for orders placed or performed during the hospital encounter of 03/28/20  Blood Culture (routine x 2)     Status: None   Collection Time: 03/28/20  9:50 PM   Specimen: BLOOD  Result Value Ref Range Status   Specimen Description BLOOD BLOOD RIGHT FOREARM  Final   Special Requests   Final    BOTTLES DRAWN AEROBIC AND ANAEROBIC Blood Culture results may not be optimal due to an inadequate volume of blood received in culture bottles   Culture   Final    NO GROWTH 5 DAYS Performed at [facility], [address], [place], [place]    Report Status 04/02/2020 FINAL  Final  Blood Culture (routine x 2)      Status: None   Collection Time: 03/28/20  9:50 PM   Specimen: BLOOD  Result Value Ref Range Status   Specimen Description BLOOD BLOOD RIGHT FOREARM  Final   Special Requests   Final    BOTTLES DRAWN AEROBIC AND ANAEROBIC Blood Culture results may not be optimal due to an inadequate volume of blood received in culture bottles   Culture   Final    NO GROWTH 5 DAYS Performed at [facility], [address], [place], [place] [address]    Report Status 04/02/2020 FINAL  Final  Urine culture     Status: None   Collection Time: 03/28/20 11:59 PM   Specimen: In/Out Cath Urine  Result Value Ref Range Status   Specimen Description   Final    IN/OUT CATH URINE Performed at [facility], [address], [place], [place] [address]    Special Requests   Final    NONE Performed at [facility], [address], [place], [place] [address]    Culture   Final    NO GROWTH Performed at [facility], [address], [place], [place] [address]    Report Status 03/30/2020 FINAL  Final  Respiratory Panel by RT PCR (Flu A&B, Covid) - Nasopharyngeal Swab     Status: None   Collection Time: 03/28/20 11:59 PM   Specimen: Nasopharyngeal Swab  Result Value Ref Range Status   SARS Coronavirus 2 by RT PCR NEGATIVE NEGATIVE Final    Comment: (NOTE) SARS-CoV-2 target nucleic acids are NOT DETECTED.  The SARS-CoV-2 RNA is generally detectable in upper respiratoy specimens during the acute phase of infection. The lowest concentration of SARS-CoV-2 viral copies this assay can detect is 131 copies/mL. A negative result does not preclude SARS-Cov-2 infection and should not be used as the sole basis for treatment or other patient management decisions. A negative result may occur with  improper specimen collection/handling, submission of specimen other than nasopharyngeal swab, presence of viral mutation(s) within the areas targeted by this assay, and inadequate number of  viral copies (<131 copies/mL). A negative result must be combined with clinical observations, patient history, and epidemiological information. The expected result is Negative.  Fact Sheet for Patients:  https://www.moore.com/  Fact Sheet for Healthcare Providers:  https://www.young.biz/  This test is no t yet approved or cleared by the [place] FDA and  has been authorized for detection and/or diagnosis of SARS-CoV-2 by FDA under an Emergency Use Authorization (EUA). This EUA will remain  in effect (meaning this test can be used) for the duration of the COVID-19 declaration under Section 564(b)(1) of the Act, 21 U.S.C. section 360bbb-3(b)(1), unless the authorization is terminated or revoked sooner.     Influenza A by PCR NEGATIVE NEGATIVE Final   Influenza B by PCR NEGATIVE NEGATIVE Final    Comment: (NOTE) The Xpert Xpress SARS-CoV-2/FLU/RSV assay is intended as an aid in  the diagnosis of influenza from Nasopharyngeal swab specimens and  should not be used as a sole basis for treatment. Nasal washings and  aspirates are unacceptable for Xpert Xpress SARS-CoV-2/FLU/RSV  testing.  Fact Sheet for Patients: https://www.moore.com/  Fact Sheet for Healthcare Providers: https://www.young.biz/  This test is not yet approved or cleared by the [place] FDA and  has been authorized for detection and/or diagnosis of SARS-CoV-2 by  FDA under an Emergency Use Authorization (EUA). This EUA will remain  in  effect (meaning this test can be used) for the duration of the  Covid-19 declaration under Section 564(b)(1) of the Act, 21  U.S.C. section 360bbb-3(b)(1), unless the authorization is  terminated or revoked. Performed at [facility], [address] [place], [place] [address]   MRSA PCR Screening     Status: None   Collection Time: 03/29/20  5:47 AM   Specimen: Nasopharyngeal  Result  Value Ref Range Status   MRSA by PCR NEGATIVE NEGATIVE Final    Comment:        The GeneXpert MRSA Assay (FDA approved for NASAL specimens only), is one component of a comprehensive MRSA colonization surveillance program. It is not intended to diagnose MRSA infection nor to guide or monitor treatment for MRSA infections. Performed at [facility], [address] [place], [place] [address]   Gastrointestinal Panel by PCR , Stool     Status: None   Collection Time: 03/29/20  7:52 AM   Specimen: Stool  Result Value Ref Range Status   Campylobacter species NOT DETECTED NOT DETECTED Final   Plesimonas shigelloides NOT DETECTED NOT DETECTED Final   Salmonella species NOT DETECTED NOT DETECTED Final   Yersinia enterocolitica NOT DETECTED NOT DETECTED Final   Vibrio species NOT DETECTED NOT DETECTED Final   Vibrio cholerae NOT DETECTED NOT DETECTED Final   Enteroaggregative E coli (EAEC) NOT DETECTED NOT DETECTED Final   Enteropathogenic E coli (EPEC) NOT DETECTED NOT DETECTED Final   Enterotoxigenic E coli (ETEC) NOT DETECTED NOT DETECTED Final   Shiga like toxin producing E coli (STEC) NOT DETECTED NOT DETECTED Final   Shigella/Enteroinvasive E coli (EIEC) NOT DETECTED NOT DETECTED Final   Cryptosporidium NOT DETECTED NOT DETECTED Final   Cyclospora cayetanensis NOT DETECTED NOT DETECTED Final   Entamoeba histolytica NOT DETECTED NOT DETECTED Final   Giardia lamblia NOT DETECTED NOT DETECTED Final   Adenovirus F40/41 NOT DETECTED NOT DETECTED Final   Astrovirus NOT DETECTED NOT DETECTED Final   Norovirus GI/GII NOT DETECTED NOT DETECTED Final   Rotavirus A NOT DETECTED NOT DETECTED Final   Sapovirus (I, II, IV, and V) NOT DETECTED NOT DETECTED Final    Comment: Performed at [facility], [address] [place], [place] [address]  C Difficile Quick Screen w PCR reflex     Status: None   Collection Time: 03/29/20  7:52 AM   Specimen: Stool  Result Value Ref  Range Status   C Diff antigen NEGATIVE NEGATIVE Final   C Diff toxin NEGATIVE NEGATIVE Final   C Diff interpretation No C. difficile detected.  Final    Comment: Performed at [facility], [address] [place], [place] [address]  Respiratory Panel by RT PCR (Flu A&B, Covid) - Nasopharyngeal Swab     Status: None   Collection Time: 04/02/20  1:29 PM   Specimen: Nasopharyngeal Swab  Result Value Ref Range Status   SARS Coronavirus 2 by RT PCR NEGATIVE NEGATIVE Final    Comment: (NOTE) SARS-CoV-2 target nucleic acids are NOT DETECTED.  The SARS-CoV-2 RNA is generally detectable in upper respiratoy specimens during the acute phase of infection. The lowest concentration of SARS-CoV-2 viral copies this assay can detect is 131 copies/mL. A negative result does not preclude SARS-Cov-2 infection and should not be used as the sole basis for treatment or other patient management decisions. A negative result may occur with  improper specimen collection/handling, submission of specimen other than nasopharyngeal swab, presence of viral mutation(s) within  the areas targeted by this assay, and inadequate number of viral copies (<131 copies/mL). A negative result must be combined with clinical observations, patient history, and epidemiological information. The expected result is Negative.  Fact Sheet for Patients:  https://www.moore.com/https://www.fda.gov/media/142436/download  Fact Sheet for Healthcare Providers:  https://www.young.biz/https://www.fda.gov/media/142435/download  This test is no t yet approved or cleared by the [place] FDA and  has been authorized for detection and/or diagnosis of SARS-CoV-2 by FDA under an Emergency Use Authorization (EUA). This EUA will remain  in effect (meaning this test can be used) for the duration of the COVID-19 declaration under Section 564(b)(1) of the Act, 21 U.S.C. section 360bbb-3(b)(1), unless the authorization is terminated or revoked sooner.     Influenza A by PCR  NEGATIVE NEGATIVE Final   Influenza B by PCR NEGATIVE NEGATIVE Final    Comment: (NOTE) The Xpert Xpress SARS-CoV-2/FLU/RSV assay is intended as an aid in  the diagnosis of influenza from Nasopharyngeal swab specimens and  should not be used as a sole basis for treatment. Nasal washings and  aspirates are unacceptable for Xpert Xpress SARS-CoV-2/FLU/RSV  testing.  Fact Sheet for Patients: https://www.moore.com/https://www.fda.gov/media/142436/download  Fact Sheet for Healthcare Providers: https://www.young.biz/https://www.fda.gov/media/142435/download  This test is not yet approved or cleared by the [place] FDA and  has been authorized for detection and/or diagnosis of SARS-CoV-2 by  FDA under an Emergency Use Authorization (EUA). This EUA will remain  in effect (meaning this test can be used) for the duration of the  Covid-19 declaration under Section 564(b)(1) of the Act, 21  U.S.C. section 360bbb-3(b)(1), unless the authorization is  terminated or revoked. Performed at [facility], [address] [place], [place] [address]      Management plans discussed with the patient, family and they are in agreement.  CODE STATUS:     Code Status Orders  (From admission, onward)         Start     Ordered   03/29/20 0356  Full code  Continuous        03/29/20 0404        Code Status History    Date Active Date Inactive Code Status Order ID Comments User Context   03/11/2020 2235 03/21/2020 0011 DNR [id number]  Anselm Junglingu, Ching T, DO ED   12/04/2019 1919 12/07/2019 1936 Full Code [id number]  Lorretta HarpNiu, Xilin, MD Inpatient   03/17/2019 2019 03/20/2019 1956 Full Code [id number]  Mayo, Allyn KennerKaty Dodd, MD Inpatient   07/05/2017 0148 07/07/2017 1630 Full Code [id number]  Ihor AustinPyreddy, Pavan, MD Inpatient   Advance Care Planning Activity    Advance Directive Documentation     Most Recent Value  Type of Advance Directive Healthcare Power of Attorney  Pre-existing out of facility DNR order (yellow form or pink MOST form) --  "MOST" Form in  Place? --      TOTAL TIME TAKING CARE OF THIS PATIENT: 35 minutes.    Alford Highlandichard Okema Rollinson M.D on 04/03/2020 at 8:10 AM  Between 7am to 6pm - Pager - [phone]  After 6pm go to [email or web address] - password [email or web address]  [facility]  CC: Primary care physician; Mickel FuchsWroth, Thomas H, MD

## 2020-04-03 NOTE — Plan of Care (Signed)
Overnight O2 remained above 93%.  Problem: Education: Goal: Knowledge of General Education information will improve Description: Including pain rating scale, medication(s)/side effects and non-pharmacologic comfort measures Outcome: Progressing   Problem: Health Behavior/Discharge Planning: Goal: Ability to manage health-related needs will improve Outcome: Progressing   Problem: Clinical Measurements: Goal: Ability to maintain clinical measurements within normal limits will improve Outcome: Progressing Goal: Will remain free from infection Outcome: Progressing Goal: Diagnostic test results will improve Outcome: Progressing Goal: Respiratory complications will improve Outcome: Progressing Goal: Cardiovascular complication will be avoided Outcome: Progressing   Problem: Activity: Goal: Risk for activity intolerance will decrease Outcome: Progressing   Problem: Nutrition: Goal: Adequate nutrition will be maintained Outcome: Progressing   Problem: Coping: Goal: Level of anxiety will decrease Outcome: Progressing   Problem: Elimination: Goal: Will not experience complications related to bowel motility Outcome: Progressing Goal: Will not experience complications related to urinary retention Outcome: Progressing   Problem: Pain Managment: Goal: General experience of comfort will improve Outcome: Progressing   Problem: Safety: Goal: Ability to remain free from injury will improve Outcome: Progressing   Problem: Skin Integrity: Goal: Risk for impaired skin integrity will decrease Outcome: Progressing   Problem: Education: Goal: Knowledge of General Education information will improve Description: Including pain rating scale, medication(s)/side effects and non-pharmacologic comfort measures Outcome: Progressing   Problem: Health Behavior/Discharge Planning: Goal: Ability to manage health-related needs will improve Outcome: Progressing   Problem: Clinical  Measurements: Goal: Ability to maintain clinical measurements within normal limits will improve Outcome: Progressing Goal: Will remain free from infection Outcome: Progressing Goal: Diagnostic test results will improve Outcome: Progressing Goal: Respiratory complications will improve Outcome: Progressing Goal: Cardiovascular complication will be avoided Outcome: Progressing   Problem: Activity: Goal: Risk for activity intolerance will decrease Outcome: Progressing   Problem: Nutrition: Goal: Adequate nutrition will be maintained Outcome: Progressing   Problem: Coping: Goal: Level of anxiety will decrease Outcome: Progressing   Problem: Elimination: Goal: Will not experience complications related to bowel motility Outcome: Progressing Goal: Will not experience complications related to urinary retention Outcome: Progressing   Problem: Pain Managment: Goal: General experience of comfort will improve Outcome: Progressing   Problem: Safety: Goal: Ability to remain free from injury will improve Outcome: Progressing   Problem: Skin Integrity: Goal: Risk for impaired skin integrity will decrease Outcome: Progressing

## 2020-04-03 NOTE — TOC Transition Note (Addendum)
Transition of Care [facility]) - CM/SW Discharge Note   Patient Details  Name: [name] MRN: [id number] Date of Birth: 05-24-1946  Transition of Care [facility]) CM/SW Contact:  Liliana Cline, LCSW Phone Number: 04/03/2020, 10:34 AM   Clinical Narrative:   Patient ready to discharge to [facility] today. Spoke with Verlon Au at [facility] who confirmed they can accept patient today, she will be in room 412. CSW updated patient and asked RN to call report. Med Necessity Form and Face Sheet placed in Discharge Packet by chart. Will call EMS when notified by RN that patient is ready for transport.  11:05- Informed by RN Rodell Perna that patient is ready for EMS. Engineer, structural. Transport arranged for 12:30 pick up time. Updated RN and Verlon Au with [facility].   Final next level of care: Skilled Nursing Facility Barriers to Discharge: Barriers Resolved   Patient Goals and CMS Choice Patient states their goals for this hospitalization and ongoing recovery are:: to return to SNF rehab at [email or web address] Compare Post Acute Care list provided to:: Patient Choice offered to / list presented to : Patient  Discharge Placement              Patient chooses bed at: [facility] Patient to be transferred to facility by: EMS Name of family member notified: Patient notified Patient and family notified of of transfer: 04/03/20  Discharge Plan and Services     Post Acute Care Choice: Skilled Nursing Facility                               Social Determinants of Health (SDOH) Interventions     Readmission Risk Interventions Readmission Risk Prevention Plan 03/30/2020  Transportation Screening Complete  PCP or Specialist Appt within 3-5 Days Complete  HRI or Home Care Consult Complete  Social Work Consult for Recovery Care Planning/Counseling Complete  Palliative Care Screening Not Applicable  Medication Review [employer] conservator/restorer) Complete  Some recent data might be hidden

## 2020-04-03 NOTE — Discharge Instructions (Signed)
Sepsis, Diagnosis, Adult Sepsis is a serious bodily reaction to an infection. The infection that triggers sepsis may be from a bacteria, virus, or fungus. Sepsis can result from an infection in any part of your body. Infections that commonly lead to sepsis include skin, lung, and urinary tract infections. Sepsis is a medical emergency that must be treated right away in a hospital. In severe cases, it can lead to septic shock. Septic shock can weaken your heart and cause your blood pressure to drop. This can cause your central nervous system and your body's organs to stop working. What are the causes? This condition is caused by a severe reaction to infections from bacteria, viruses, or fungus. The germs that most often lead to sepsis include:  Escherichia coli (E. coli) bacteria.  Staphylococcus aureus (staph) bacteria.  Some types of Streptococcus bacteria. The most common infections affect these organs:  The lung (pneumonia).  The kidneys or bladder (urinary tract infection).  The skin (cellulitis).  The bowel, gallbladder, or pancreas. What increases the risk? You are more likely to develop this condition if:  Your body's disease-fighting system (immune system) is weakened.  You are age 65 or older.  You are female.  You had surgery or you have been hospitalized.  You have these devices inserted into your body: ? A small, thin tube (catheter). ? IV line. ? Breathing tube. ? Drainage tube.  You are not getting enough nutrients from food (malnourished).  You have a long-term (chronic) disease, such as cancer, lung disease, kidney disease, or diabetes.  You are African American. What are the signs or symptoms? Symptoms of this condition may include:  Fever.  Chills or feeling very cold.  Confusion or anxiety.  Fatigue.  Muscle aches.  Shortness of breath.  Nausea and vomiting.  Urinating much less than usual.  Fast heart rate (tachycardia).  Rapid  breathing (hyperventilation).  Changes in skin color. Your skin may look blotchy, pale, or blue.  Cool, clammy, or sweaty skin.  Skin rash. Other symptoms depend on the source of your infection. How is this diagnosed? This condition is diagnosed based on:  Your symptoms.  Your medical history.  A physical exam. Other tests may also be done to find out the cause of the infection and how severe the sepsis is. These tests may include:  Blood tests.  Urine tests.  Swabs from other areas of your body that may have an infection. These samples may be tested (cultured) to find out what type of bacteria is causing the infection.  Chest X-ray to check for pneumonia. Other imaging tests, such as a CT scan, may also be done.  Lumbar puncture. This removes a small amount of the fluid that surrounds your brain and spinal cord. The fluid is then examined for infection. How is this treated? This condition must be treated in a hospital. Based on the cause of your infection, you may be given an antibiotic, antiviral, or antifungal medicine. You may also receive:  Fluids through an IV.  Oxygen and breathing assistance.  Medicines to increase your blood pressure.  Kidney dialysis. This process cleans your blood if your kidneys have failed.  Surgery to remove infected tissue.  Blood transfusion if needed.  Medicine to prevent blood clots.  Nutrients to correct imbalances in basic body function (metabolism). You may: ? Receive important salts and minerals (electrolytes) through an IV. ? Have your blood sugar level adjusted. Follow these instructions at home: Medicines   Take over-the-counter and   prescription medicines only as told by your health care provider.  If you were prescribed an antibiotic, antiviral, or antifungal medicine, take it as told by your health care provider. Do not stop taking the medicine even if you start to feel better. General instructions  If you have a  catheter or other indwelling device, ask to have it removed as soon as possible.  Keep all follow-up visits as told by your health care provider. This is important. Contact a health care provider if:  You do not feel like you are getting better or regaining strength.  You are having trouble coping with your recovery.  You frequently feel tired.  You feel worse or do not seem to get better after surgery.  You think you may have an infection after surgery. Get help right away if:  You have any symptoms of sepsis.  You have difficulty breathing.  You have a rapid or skipping heartbeat.  You become confused or disoriented.  You have a high fever.  Your skin becomes blotchy, pale, or blue.  You have an infection that is getting worse or not getting better. These symptoms may represent a serious problem that is an emergency. Do not wait to see if the symptoms will go away. Get medical help right away. Call your local emergency services (911 in the [place] Do not drive yourself to the hospital. Summary  Sepsis is a medical emergency that requires immediate treatment in a hospital.  This condition is caused by a severe reaction to infections from bacteria, viruses, or fungus.  Based on the cause of your infection, you may be given an antibiotic, antiviral, or antifungal medicine.  Treatment may also include IV fluids, breathing assistance, and kidney dialysis. This information is not intended to replace advice given to you by your health care provider. Make sure you discuss any questions you have with your health care provider. Document Revised: 12/25/2017 Document Reviewed: 12/25/2017 Elsevier Patient Education  2020 [employer]  

## 2020-04-03 NOTE — Plan of Care (Signed)
Problem: Education: Goal: Knowledge of General Education information will improve Description: Including pain rating scale, medication(s)/side effects and non-pharmacologic comfort measures 04/03/2020 1017 by Fransico Setters, RN Outcome: Adequate for Discharge 04/03/2020 1017 by Fransico Setters, RN Outcome: Adequate for Discharge 04/03/2020 1016 by Fransico Setters, RN Outcome: Progressing   Problem: Health Behavior/Discharge Planning: Goal: Ability to manage health-related needs will improve 04/03/2020 1017 by Fransico Setters, RN Outcome: Adequate for Discharge 04/03/2020 1017 by Fransico Setters, RN Outcome: Adequate for Discharge 04/03/2020 1016 by Fransico Setters, RN Outcome: Progressing   Problem: Clinical Measurements: Goal: Ability to maintain clinical measurements within normal limits will improve 04/03/2020 1017 by Fransico Setters, RN Outcome: Adequate for Discharge 04/03/2020 1017 by Fransico Setters, RN Outcome: Adequate for Discharge 04/03/2020 1016 by Fransico Setters, RN Outcome: Progressing Goal: Will remain free from infection 04/03/2020 1017 by Fransico Setters, RN Outcome: Adequate for Discharge 04/03/2020 1017 by Fransico Setters, RN Outcome: Adequate for Discharge 04/03/2020 1016 by Fransico Setters, RN Outcome: Progressing Goal: Diagnostic test results will improve 04/03/2020 1017 by Fransico Setters, RN Outcome: Adequate for Discharge 04/03/2020 1017 by Fransico Setters, RN Outcome: Adequate for Discharge 04/03/2020 1016 by Fransico Setters, RN Outcome: Progressing Goal: Respiratory complications will improve 04/03/2020 1017 by Fransico Setters, RN Outcome: Adequate for Discharge 04/03/2020 1017 by Fransico Setters, RN Outcome: Adequate for Discharge 04/03/2020 1016 by Fransico Setters, RN Outcome: Progressing Goal: Cardiovascular complication will be avoided 04/03/2020 1017 by Fransico Setters, RN Outcome: Adequate for Discharge 04/03/2020 1017 by Fransico Setters,  RN Outcome: Adequate for Discharge 04/03/2020 1016 by Fransico Setters, RN Outcome: Progressing   Problem: Activity: Goal: Risk for activity intolerance will decrease 04/03/2020 1017 by Fransico Setters, RN Outcome: Adequate for Discharge 04/03/2020 1017 by Fransico Setters, RN Outcome: Adequate for Discharge 04/03/2020 1016 by Fransico Setters, RN Outcome: Progressing   Problem: Nutrition: Goal: Adequate nutrition will be maintained 04/03/2020 1017 by Fransico Setters, RN Outcome: Adequate for Discharge 04/03/2020 1017 by Fransico Setters, RN Outcome: Adequate for Discharge 04/03/2020 1016 by Fransico Setters, RN Outcome: Progressing   Problem: Coping: Goal: Level of anxiety will decrease 04/03/2020 1017 by Fransico Setters, RN Outcome: Adequate for Discharge 04/03/2020 1017 by Fransico Setters, RN Outcome: Adequate for Discharge 04/03/2020 1016 by Fransico Setters, RN Outcome: Progressing   Problem: Elimination: Goal: Will not experience complications related to bowel motility 04/03/2020 1017 by Fransico Setters, RN Outcome: Adequate for Discharge 04/03/2020 1017 by Fransico Setters, RN Outcome: Adequate for Discharge 04/03/2020 1016 by Fransico Setters, RN Outcome: Progressing Goal: Will not experience complications related to urinary retention 04/03/2020 1017 by Fransico Setters, RN Outcome: Adequate for Discharge 04/03/2020 1017 by Fransico Setters, RN Outcome: Adequate for Discharge 04/03/2020 1016 by Fransico Setters, RN Outcome: Progressing   Problem: Pain Managment: Goal: General experience of comfort will improve 04/03/2020 1017 by Fransico Setters, RN Outcome: Adequate for Discharge 04/03/2020 1017 by Fransico Setters, RN Outcome: Adequate for Discharge 04/03/2020 1016 by Fransico Setters, RN Outcome: Progressing   Problem: Safety: Goal: Ability to remain free from injury will improve 04/03/2020 1017 by Fransico Setters, RN Outcome: Adequate for Discharge 04/03/2020 1017 by Fransico Setters, RN Outcome: Adequate for Discharge 04/03/2020 1016 by Fransico Setters, RN Outcome: Progressing   Problem: Skin Integrity: Goal: Risk for impaired skin integrity will  decrease 04/03/2020 1017 by Fransico Setters, RN Outcome: Adequate for Discharge 04/03/2020 1017 by Fransico Setters, RN Outcome: Adequate for Discharge 04/03/2020 1016 by Fransico Setters, RN Outcome: Progressing   Problem: Education: Goal: Knowledge of General Education information will improve Description: Including pain rating scale, medication(s)/side effects and non-pharmacologic comfort measures 04/03/2020 1017 by Fransico Setters, RN Outcome: Adequate for Discharge 04/03/2020 1017 by Fransico Setters, RN Outcome: Adequate for Discharge 04/03/2020 1016 by Fransico Setters, RN Outcome: Progressing   Problem: Health Behavior/Discharge Planning: Goal: Ability to manage health-related needs will improve 04/03/2020 1017 by Fransico Setters, RN Outcome: Adequate for Discharge 04/03/2020 1017 by Fransico Setters, RN Outcome: Adequate for Discharge 04/03/2020 1016 by Fransico Setters, RN Outcome: Progressing   Problem: Clinical Measurements: Goal: Ability to maintain clinical measurements within normal limits will improve 04/03/2020 1017 by Fransico Setters, RN Outcome: Adequate for Discharge 04/03/2020 1017 by Fransico Setters, RN Outcome: Adequate for Discharge 04/03/2020 1016 by Fransico Setters, RN Outcome: Progressing Goal: Will remain free from infection 04/03/2020 1017 by Fransico Setters, RN Outcome: Adequate for Discharge 04/03/2020 1017 by Fransico Setters, RN Outcome: Adequate for Discharge 04/03/2020 1016 by Fransico Setters, RN Outcome: Progressing Goal: Diagnostic test results will improve 04/03/2020 1017 by Fransico Setters, RN Outcome: Adequate for Discharge 04/03/2020 1017 by Fransico Setters, RN Outcome: Adequate for Discharge 04/03/2020 1016 by Fransico Setters, RN Outcome: Progressing Goal: Respiratory  complications will improve 04/03/2020 1017 by Fransico Setters, RN Outcome: Adequate for Discharge 04/03/2020 1017 by Fransico Setters, RN Outcome: Adequate for Discharge 04/03/2020 1016 by Fransico Setters, RN Outcome: Progressing Goal: Cardiovascular complication will be avoided 04/03/2020 1017 by Fransico Setters, RN Outcome: Adequate for Discharge 04/03/2020 1017 by Fransico Setters, RN Outcome: Adequate for Discharge 04/03/2020 1016 by Fransico Setters, RN Outcome: Progressing   Problem: Activity: Goal: Risk for activity intolerance will decrease 04/03/2020 1017 by Fransico Setters, RN Outcome: Adequate for Discharge 04/03/2020 1017 by Fransico Setters, RN Outcome: Adequate for Discharge 04/03/2020 1016 by Fransico Setters, RN Outcome: Progressing   Problem: Nutrition: Goal: Adequate nutrition will be maintained 04/03/2020 1017 by Fransico Setters, RN Outcome: Adequate for Discharge 04/03/2020 1017 by Fransico Setters, RN Outcome: Adequate for Discharge 04/03/2020 1016 by Fransico Setters, RN Outcome: Progressing   Problem: Coping: Goal: Level of anxiety will decrease 04/03/2020 1017 by Fransico Setters, RN Outcome: Adequate for Discharge 04/03/2020 1017 by Fransico Setters, RN Outcome: Adequate for Discharge 04/03/2020 1016 by Fransico Setters, RN Outcome: Progressing   Problem: Elimination: Goal: Will not experience complications related to bowel motility 04/03/2020 1017 by Fransico Setters, RN Outcome: Adequate for Discharge 04/03/2020 1017 by Fransico Setters, RN Outcome: Adequate for Discharge 04/03/2020 1016 by Fransico Setters, RN Outcome: Progressing Goal: Will not experience complications related to urinary retention 04/03/2020 1017 by Fransico Setters, RN Outcome: Adequate for Discharge 04/03/2020 1017 by Fransico Setters, RN Outcome: Adequate for Discharge 04/03/2020 1016 by Fransico Setters, RN Outcome: Progressing   Problem: Pain Managment: Goal: General experience of comfort will  improve 04/03/2020 1017 by Fransico Setters, RN Outcome: Adequate for Discharge 04/03/2020 1017 by Fransico Setters, RN Outcome: Adequate for Discharge 04/03/2020 1016 by Fransico Setters, RN Outcome: Progressing   Problem: Safety: Goal: Ability to remain free from injury will improve 04/03/2020 1017 by Fransico Setters, RN Outcome: Adequate  for Discharge 04/03/2020 1017 by Fransico Setters, RN Outcome: Adequate for Discharge 04/03/2020 1016 by Fransico Setters, RN Outcome: Progressing   Problem: Skin Integrity: Goal: Risk for impaired skin integrity will decrease 04/03/2020 1017 by Fransico Setters, RN Outcome: Adequate for Discharge 04/03/2020 1017 by Fransico Setters, RN Outcome: Adequate for Discharge 04/03/2020 1016 by Fransico Setters, RN Outcome: Progressing

## 2020-04-08 ENCOUNTER — Emergency Department: Admission: EM | Admit: 2020-04-08 | Payer: Medicare Other | Source: Home / Self Care

## 2020-04-08 ENCOUNTER — Other Ambulatory Visit: Payer: Self-pay

## 2020-04-08 ENCOUNTER — Inpatient Hospital Stay
Admission: EM | Admit: 2020-04-08 | Discharge: 2020-04-13 | DRG: 291 | Disposition: A | Discharge status: Skilled Nursing Facility | Payer: Medicare Other | Source: Skilled Nursing Facility | Attending: Obstetrics and Gynecology | Admitting: Obstetrics and Gynecology

## 2020-04-08 DIAGNOSIS — I480 Paroxysmal atrial fibrillation: Secondary | ICD-10-CM | POA: Diagnosis present

## 2020-04-08 DIAGNOSIS — K589 Irritable bowel syndrome without diarrhea: Secondary | ICD-10-CM | POA: Diagnosis present

## 2020-04-08 DIAGNOSIS — Z8673 Personal history of transient ischemic attack (TIA), and cerebral infarction without residual deficits: Secondary | ICD-10-CM

## 2020-04-08 DIAGNOSIS — R0902 Hypoxemia: Secondary | ICD-10-CM | POA: Diagnosis present

## 2020-04-08 DIAGNOSIS — G40909 Epilepsy, unspecified, not intractable, without status epilepticus: Secondary | ICD-10-CM | POA: Diagnosis present

## 2020-04-08 DIAGNOSIS — Z8042 Family history of malignant neoplasm of prostate: Secondary | ICD-10-CM

## 2020-04-08 DIAGNOSIS — I959 Hypotension, unspecified: Secondary | ICD-10-CM | POA: Diagnosis present

## 2020-04-08 DIAGNOSIS — I272 Pulmonary hypertension, unspecified: Secondary | ICD-10-CM | POA: Diagnosis present

## 2020-04-08 DIAGNOSIS — I5033 Acute on chronic diastolic (congestive) heart failure: Secondary | ICD-10-CM | POA: Diagnosis present

## 2020-04-08 DIAGNOSIS — I509 Heart failure, unspecified: Secondary | ICD-10-CM

## 2020-04-08 DIAGNOSIS — Z20822 Contact with and (suspected) exposure to covid-19: Secondary | ICD-10-CM | POA: Diagnosis present

## 2020-04-08 DIAGNOSIS — I110 Hypertensive heart disease with heart failure: Secondary | ICD-10-CM | POA: Diagnosis not present

## 2020-04-08 DIAGNOSIS — F419 Anxiety disorder, unspecified: Secondary | ICD-10-CM | POA: Diagnosis present

## 2020-04-08 DIAGNOSIS — R0602 Shortness of breath: Secondary | ICD-10-CM

## 2020-04-08 DIAGNOSIS — E878 Other disorders of electrolyte and fluid balance, not elsewhere classified: Secondary | ICD-10-CM | POA: Diagnosis present

## 2020-04-08 DIAGNOSIS — K219 Gastro-esophageal reflux disease without esophagitis: Secondary | ICD-10-CM | POA: Diagnosis present

## 2020-04-08 DIAGNOSIS — Z933 Colostomy status: Secondary | ICD-10-CM

## 2020-04-08 DIAGNOSIS — E871 Hypo-osmolality and hyponatremia: Secondary | ICD-10-CM | POA: Diagnosis present

## 2020-04-08 DIAGNOSIS — Z79899 Other long term (current) drug therapy: Secondary | ICD-10-CM

## 2020-04-08 DIAGNOSIS — Z9049 Acquired absence of other specified parts of digestive tract: Secondary | ICD-10-CM

## 2020-04-08 DIAGNOSIS — J9601 Acute respiratory failure with hypoxia: Secondary | ICD-10-CM

## 2020-04-08 DIAGNOSIS — Z7901 Long term (current) use of anticoagulants: Secondary | ICD-10-CM

## 2020-04-08 DIAGNOSIS — E039 Hypothyroidism, unspecified: Secondary | ICD-10-CM | POA: Diagnosis present

## 2020-04-08 DIAGNOSIS — I341 Nonrheumatic mitral (valve) prolapse: Secondary | ICD-10-CM | POA: Diagnosis present

## 2020-04-08 NOTE — ED Triage Notes (Signed)
Pt brought in from [facility] from [facility] for shortness of breath and chest pain. Pt states she has h/o of CHF and A.fib. Pt also reports that her metoprolol dosage was increased from 30 to 50mg  to prevent her heart rate from elevating. Pt still is complaining of chest tightness. Pt is A/Ox4 and currently sating at 95% on RA.

## 2020-04-09 ENCOUNTER — Ambulatory Visit: Payer: Medicare Other

## 2020-04-09 ENCOUNTER — Emergency Department: Payer: Medicare Other

## 2020-04-09 ENCOUNTER — Encounter: Payer: Self-pay | Admitting: Family Medicine

## 2020-04-09 DIAGNOSIS — I272 Pulmonary hypertension, unspecified: Secondary | ICD-10-CM | POA: Diagnosis present

## 2020-04-09 DIAGNOSIS — Z9049 Acquired absence of other specified parts of digestive tract: Secondary | ICD-10-CM | POA: Diagnosis not present

## 2020-04-09 DIAGNOSIS — I5031 Acute diastolic (congestive) heart failure: Secondary | ICD-10-CM | POA: Diagnosis not present

## 2020-04-09 DIAGNOSIS — I341 Nonrheumatic mitral (valve) prolapse: Secondary | ICD-10-CM | POA: Diagnosis present

## 2020-04-09 DIAGNOSIS — R0602 Shortness of breath: Secondary | ICD-10-CM | POA: Diagnosis present

## 2020-04-09 DIAGNOSIS — I10 Essential (primary) hypertension: Secondary | ICD-10-CM | POA: Diagnosis not present

## 2020-04-09 DIAGNOSIS — Z8673 Personal history of transient ischemic attack (TIA), and cerebral infarction without residual deficits: Secondary | ICD-10-CM | POA: Diagnosis not present

## 2020-04-09 DIAGNOSIS — I509 Heart failure, unspecified: Secondary | ICD-10-CM

## 2020-04-09 DIAGNOSIS — Z933 Colostomy status: Secondary | ICD-10-CM | POA: Diagnosis not present

## 2020-04-09 DIAGNOSIS — I480 Paroxysmal atrial fibrillation: Secondary | ICD-10-CM | POA: Diagnosis not present

## 2020-04-09 DIAGNOSIS — Z79899 Other long term (current) drug therapy: Secondary | ICD-10-CM | POA: Diagnosis not present

## 2020-04-09 DIAGNOSIS — F419 Anxiety disorder, unspecified: Secondary | ICD-10-CM | POA: Diagnosis present

## 2020-04-09 DIAGNOSIS — I110 Hypertensive heart disease with heart failure: Secondary | ICD-10-CM | POA: Diagnosis present

## 2020-04-09 DIAGNOSIS — Z7901 Long term (current) use of anticoagulants: Secondary | ICD-10-CM | POA: Diagnosis not present

## 2020-04-09 DIAGNOSIS — Z20822 Contact with and (suspected) exposure to covid-19: Secondary | ICD-10-CM | POA: Diagnosis present

## 2020-04-09 DIAGNOSIS — K589 Irritable bowel syndrome without diarrhea: Secondary | ICD-10-CM | POA: Diagnosis present

## 2020-04-09 DIAGNOSIS — I5033 Acute on chronic diastolic (congestive) heart failure: Secondary | ICD-10-CM | POA: Diagnosis present

## 2020-04-09 DIAGNOSIS — E039 Hypothyroidism, unspecified: Secondary | ICD-10-CM

## 2020-04-09 DIAGNOSIS — K219 Gastro-esophageal reflux disease without esophagitis: Secondary | ICD-10-CM | POA: Diagnosis present

## 2020-04-09 DIAGNOSIS — G40909 Epilepsy, unspecified, not intractable, without status epilepticus: Secondary | ICD-10-CM | POA: Diagnosis present

## 2020-04-09 DIAGNOSIS — E878 Other disorders of electrolyte and fluid balance, not elsewhere classified: Secondary | ICD-10-CM | POA: Diagnosis present

## 2020-04-09 DIAGNOSIS — Z8042 Family history of malignant neoplasm of prostate: Secondary | ICD-10-CM | POA: Diagnosis not present

## 2020-04-09 DIAGNOSIS — R0902 Hypoxemia: Secondary | ICD-10-CM | POA: Diagnosis present

## 2020-04-09 DIAGNOSIS — I959 Hypotension, unspecified: Secondary | ICD-10-CM | POA: Diagnosis present

## 2020-04-09 DIAGNOSIS — E871 Hypo-osmolality and hyponatremia: Secondary | ICD-10-CM | POA: Diagnosis present

## 2020-04-09 LAB — CBC WITH DIFFERENTIAL/PLATELET
Abs Immature Granulocytes: 0.1 10*3/uL — ABNORMAL HIGH (ref 0.00–0.07)
Abs Immature Granulocytes: 0.16 10*3/uL — ABNORMAL HIGH (ref 0.00–0.07)
Basophils Absolute: 0.1 10*3/uL (ref 0.0–0.1)
Basophils Absolute: 0.1 10*3/uL (ref 0.0–0.1)
Basophils Relative: 1 %
Basophils Relative: 1 %
Eosinophils Absolute: 0.1 10*3/uL (ref 0.0–0.5)
Eosinophils Absolute: 0.1 10*3/uL (ref 0.0–0.5)
Eosinophils Relative: 1 %
Eosinophils Relative: 1 %
HCT: 33.6 % — ABNORMAL LOW (ref 36.0–46.0)
HCT: 34.6 % — ABNORMAL LOW (ref 36.0–46.0)
Hemoglobin: 10.8 g/dL — ABNORMAL LOW (ref 12.0–15.0)
Hemoglobin: 11.1 g/dL — ABNORMAL LOW (ref 12.0–15.0)
Immature Granulocytes: 1 %
Immature Granulocytes: 1 %
Lymphocytes Relative: 10 %
Lymphocytes Relative: 9 %
Lymphs Abs: 1.3 10*3/uL (ref 0.7–4.0)
Lymphs Abs: 1.3 10*3/uL (ref 0.7–4.0)
MCH: 26.4 pg (ref 26.0–34.0)
MCH: 26.4 pg (ref 26.0–34.0)
MCHC: 32.1 g/dL (ref 30.0–36.0)
MCHC: 32.1 g/dL (ref 30.0–36.0)
MCV: 82.2 fL (ref 80.0–100.0)
MCV: 82.4 fL (ref 80.0–100.0)
Monocytes Absolute: 0.8 10*3/uL (ref 0.1–1.0)
Monocytes Absolute: 0.9 10*3/uL (ref 0.1–1.0)
Monocytes Relative: 6 %
Monocytes Relative: 7 %
Neutro Abs: 11 10*3/uL — ABNORMAL HIGH (ref 1.7–7.7)
Neutro Abs: 11.5 10*3/uL — ABNORMAL HIGH (ref 1.7–7.7)
Neutrophils Relative %: 81 %
Neutrophils Relative %: 81 %
Platelets: 311 10*3/uL (ref 150–400)
Platelets: 313 10*3/uL (ref 150–400)
RBC: 4.09 MIL/uL (ref 3.87–5.11)
RBC: 4.2 MIL/uL (ref 3.87–5.11)
RDW: 16.7 % — ABNORMAL HIGH (ref 11.5–15.5)
RDW: 16.9 % — ABNORMAL HIGH (ref 11.5–15.5)
WBC: 13.2 10*3/uL — ABNORMAL HIGH (ref 4.0–10.5)
WBC: 14.1 10*3/uL — ABNORMAL HIGH (ref 4.0–10.5)
nRBC: 0 % (ref 0.0–0.2)
nRBC: 0 % (ref 0.0–0.2)

## 2020-04-09 LAB — BRAIN NATRIURETIC PEPTIDE: B Natriuretic Peptide: 1381.6 pg/mL — ABNORMAL HIGH (ref 0.0–100.0)

## 2020-04-09 LAB — URINALYSIS, COMPLETE (UACMP) WITH MICROSCOPIC
Bacteria, UA: NONE SEEN
Bilirubin Urine: NEGATIVE
Glucose, UA: NEGATIVE mg/dL
Hgb urine dipstick: NEGATIVE
Ketones, ur: NEGATIVE mg/dL
Leukocytes,Ua: NEGATIVE
Nitrite: NEGATIVE
Protein, ur: NEGATIVE mg/dL
Specific Gravity, Urine: 1.004 — ABNORMAL LOW (ref 1.005–1.030)
pH: 6 (ref 5.0–8.0)

## 2020-04-09 LAB — COMPREHENSIVE METABOLIC PANEL
ALT: 34 U/L (ref 0–44)
AST: 26 U/L (ref 15–41)
Albumin: 2.7 g/dL — ABNORMAL LOW (ref 3.5–5.0)
Alkaline Phosphatase: 107 U/L (ref 38–126)
Anion gap: 10 (ref 5–15)
BUN: 11 mg/dL (ref 8–23)
CO2: 22 mmol/L (ref 22–32)
Calcium: 8.3 mg/dL — ABNORMAL LOW (ref 8.9–10.3)
Chloride: 97 mmol/L — ABNORMAL LOW (ref 98–111)
Creatinine, Ser: 0.81 mg/dL (ref 0.44–1.00)
GFR, Estimated: >60 mL/min (ref >60)
Glucose, Bld: 117 mg/dL — ABNORMAL HIGH (ref 70–99)
Potassium: 4.8 mmol/L (ref 3.5–5.1)
Sodium: 129 mmol/L — ABNORMAL LOW (ref 135–145)
Total Bilirubin: 0.8 mg/dL (ref 0.3–1.2)
Total Protein: 6.3 g/dL — ABNORMAL LOW (ref 6.5–8.1)

## 2020-04-09 LAB — MAGNESIUM: Magnesium: 2 mg/dL (ref 1.7–2.4)

## 2020-04-09 LAB — RESP PANEL BY RT PCR (RSV, FLU A&B, COVID)
Influenza A by PCR: NEGATIVE
Influenza B by PCR: NEGATIVE
Respiratory Syncytial Virus by PCR: NEGATIVE
SARS Coronavirus 2 by RT PCR: NEGATIVE

## 2020-04-09 LAB — TROPONIN I (HIGH SENSITIVITY)
Troponin I (High Sensitivity): 7 ng/L (ref <18)
Troponin I (High Sensitivity): 6 ng/L (ref <18)

## 2020-04-09 LAB — TSH: TSH: 7.474 u[IU]/mL — ABNORMAL HIGH (ref 0.350–4.500)

## 2020-04-09 MED ORDER — METOPROLOL SUCCINATE ER 50 MG PO TB24
50.0000 mg | ORAL_TABLET | Freq: Two times a day (BID) | ORAL | Status: DC
  Administered 2020-04-09 – 2020-04-13 (×9): 50 mg via ORAL
  Filled 2020-04-09 (×9): qty 1

## 2020-04-09 MED ORDER — AMIODARONE HCL 200 MG PO TABS
200.0000 mg | ORAL_TABLET | Freq: Every day | ORAL | Status: DC
  Administered 2020-04-09 – 2020-04-13 (×5): 200 mg via ORAL
  Filled 2020-04-09 (×5): qty 1

## 2020-04-09 MED ORDER — ENOXAPARIN SODIUM 40 MG/0.4ML ~~LOC~~ SOLN: 40.0000 mg | SUBCUTANEOUS | Status: DC

## 2020-04-09 MED ORDER — HYDROCERIN EX CREA
1.0000 "application " | TOPICAL_CREAM | Freq: Two times a day (BID) | CUTANEOUS | Status: DC
  Administered 2020-04-09 – 2020-04-13 (×9): 1 via TOPICAL
  Filled 2020-04-09: qty 113

## 2020-04-09 MED ORDER — ADULT MULTIVITAMIN W/MINERALS CH
1.0000 | ORAL_TABLET | Freq: Every day | ORAL | Status: DC
  Administered 2020-04-09 – 2020-04-13 (×5): 1 via ORAL
  Filled 2020-04-09 (×5): qty 1

## 2020-04-09 MED ORDER — FOLIC ACID 1 MG PO TABS
1.0000 mg | ORAL_TABLET | Freq: Every day | ORAL | Status: DC
  Administered 2020-04-09 – 2020-04-13 (×5): 1 mg via ORAL
  Filled 2020-04-09 (×5): qty 1

## 2020-04-09 MED ORDER — SODIUM CHLORIDE 0.9% FLUSH: 3.0000 mL | INTRAVENOUS | Status: DC | PRN

## 2020-04-09 MED ORDER — ZOLPIDEM TARTRATE 5 MG PO TABS: 5.0000 mg | ORAL_TABLET | Freq: Every evening | ORAL | Status: DC | PRN

## 2020-04-09 MED ORDER — ENSURE MAX PROTEIN PO LIQD
11.0000 [oz_av] | Freq: Two times a day (BID) | ORAL | Status: DC
  Administered 2020-04-11 – 2020-04-12 (×2): 11 [oz_av] via ORAL
  Filled 2020-04-09: qty 330

## 2020-04-09 MED ORDER — SODIUM CHLORIDE 0.9 % IV SOLN: 250.0000 mL | INTRAVENOUS | Status: DC | PRN

## 2020-04-09 MED ORDER — ONDANSETRON HCL 4 MG/2ML IJ SOLN: 4.0000 mg | Freq: Four times a day (QID) | INTRAMUSCULAR | Status: DC | PRN

## 2020-04-09 MED ORDER — ALPRAZOLAM 0.25 MG PO TABS
0.2500 mg | ORAL_TABLET | Freq: Two times a day (BID) | ORAL | Status: DC | PRN
  Administered 2020-04-11: 0.25 mg via ORAL
  Filled 2020-04-09 (×2): qty 1

## 2020-04-09 MED ORDER — ASPIRIN EC 81 MG PO TBEC
81.0000 mg | DELAYED_RELEASE_TABLET | Freq: Every day | ORAL | Status: DC
  Administered 2020-04-09 – 2020-04-10 (×2): 81 mg via ORAL
  Filled 2020-04-09 (×2): qty 1

## 2020-04-09 MED ORDER — MIDODRINE HCL 5 MG PO TABS
2.5000 mg | ORAL_TABLET | Freq: Three times a day (TID) | ORAL | Status: DC
  Administered 2020-04-09 – 2020-04-13 (×14): 2.5 mg via ORAL
  Filled 2020-04-09 (×14): qty 1

## 2020-04-09 MED ORDER — FUROSEMIDE 10 MG/ML IJ SOLN
20.0000 mg | Freq: Once | INTRAMUSCULAR | Status: AC
  Administered 2020-04-09: 20 mg via INTRAVENOUS
  Filled 2020-04-09: qty 4

## 2020-04-09 MED ORDER — HYDROCORTISONE (PERIANAL) 2.5 % EX CREA
1.0000 "application " | TOPICAL_CREAM | Freq: Two times a day (BID) | CUTANEOUS | Status: DC | PRN
  Filled 2020-04-09: qty 28.35

## 2020-04-09 MED ORDER — APIXABAN 5 MG PO TABS
5.0000 mg | ORAL_TABLET | Freq: Two times a day (BID) | ORAL | Status: DC
  Administered 2020-04-09 – 2020-04-13 (×9): 5 mg via ORAL
  Filled 2020-04-09 (×9): qty 1

## 2020-04-09 MED ORDER — SODIUM CHLORIDE 0.9% FLUSH
3.0000 mL | Freq: Two times a day (BID) | INTRAVENOUS | Status: DC
  Administered 2020-04-09 – 2020-04-13 (×10): 3 mL via INTRAVENOUS

## 2020-04-09 MED ORDER — PANTOPRAZOLE SODIUM 40 MG PO TBEC
40.0000 mg | DELAYED_RELEASE_TABLET | Freq: Every day | ORAL | Status: DC
  Administered 2020-04-09 – 2020-04-13 (×5): 40 mg via ORAL
  Filled 2020-04-09 (×5): qty 1

## 2020-04-09 MED ORDER — LEVOTHYROXINE SODIUM 50 MCG PO TABS
75.0000 ug | ORAL_TABLET | Freq: Every day | ORAL | Status: DC
  Administered 2020-04-09 – 2020-04-13 (×5): 75 ug via ORAL
  Filled 2020-04-09 (×5): qty 1

## 2020-04-09 MED ORDER — ACETAMINOPHEN 325 MG PO TABS
650.0000 mg | ORAL_TABLET | ORAL | Status: DC | PRN
  Administered 2020-04-10: 650 mg via ORAL
  Filled 2020-04-09: qty 2

## 2020-04-09 MED ORDER — FUROSEMIDE 10 MG/ML IJ SOLN
40.0000 mg | Freq: Two times a day (BID) | INTRAMUSCULAR | Status: DC
  Administered 2020-04-09 – 2020-04-12 (×7): 40 mg via INTRAVENOUS
  Filled 2020-04-09 (×7): qty 4

## 2020-04-09 MED ORDER — POTASSIUM CHLORIDE CRYS ER 10 MEQ PO TBCR
10.0000 meq | EXTENDED_RELEASE_TABLET | Freq: Every day | ORAL | Status: DC
  Administered 2020-04-09 – 2020-04-13 (×5): 10 meq via ORAL
  Filled 2020-04-09 (×5): qty 1

## 2020-04-09 MED ORDER — DILTIAZEM HCL ER COATED BEADS 120 MG PO CP24
120.0000 mg | ORAL_CAPSULE | Freq: Every day | ORAL | Status: DC
  Administered 2020-04-09 – 2020-04-13 (×5): 120 mg via ORAL
  Filled 2020-04-09 (×5): qty 1

## 2020-04-09 NOTE — Consult Note (Addendum)
WOC Nurse Consult Note: Reason for Consult: Consult requested for abd wound.  Pt had surgery last month and lower post-op wound dehisced at some point.  Refer to surgical team consult note on 10/28. Wound type: Lower abd chronic full thickness wound; 1.5X1.5X2cm, 100% red and moist, mod amt tan drainage, no odor Dressing procedure/placement/frequency: Continue present plan of care as previously ordered by the surgical team: Pack abd wound with Iodoform strip BID, using swab to fill, then cover with foam dressing. (Change foam dressing Q 3 days or PRN soiling.) Supplies left at the bedside for staff nurses use.  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  She is staying at a SNF and has total assistance with pouch application and emptying.  Pt watched the process and discussed the steps for pouch changes. 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  Education provided: N/A: Pt is staying at a SNF and has total assistance with pouch application and emptying.  Current pouch was leaking behind the barrier.  Applied barrier ring and 2 piece (2 3/4 inches) pouching system.  2 extra sets of barrier rings.barriers/pouches left at the bedside for staff nurses use. Enrolled patient in [employer] DC program: Yes; previous admission Please re-consult if further assistance is needed.  Thank-you,  Cammie Mcgee MSN, RN, CWOCN, [place], CNS [phone]

## 2020-04-09 NOTE — ED Notes (Signed)
RN attempted to call report x1 

## 2020-04-09 NOTE — Evaluation (Signed)
Physical Therapy Evaluation Patient Details Name: [name] MRN: [id number] DOB: June 07, 1945 Today's Date: 04/09/2020   History of Present Illness  [name] is a 74yoF who comes to [facility] on 11/7 from [facility] with increased SOB adn CP. PMH: dCHF, HTN, IBS, CVA, seizures, AF on Eliquis- pt uses O2 at night. 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 Augmentin.  She was admitted here severe sepsis on 10/28 and aggressively hydrated.  Clinical Impression  Pt admitted with above diagnosis. Pt currently with functional limitations due to the deficits listed below (see "PT Problem List"). Upon entry, pt in bed, awake and agreeable to participate. The pt is alert and oriented x4, pleasant, conversational, and generally a good historian. Pt is somewhat distractible at times, requires redirection to attend to task, has ST memory difficulty in session. ModA for bed mobility, supervision for transfers, and minGuard A for slow AMB in room, with worsening weakness of legs toward end. Pt has some increased dyspnea with be mobility, but none with AMB. Pt SpO2 and HR WNL throughout session on room air. Functional mobility assessment demonstrates increased effort/time requirements, poor tolerance, and need for physical assistance, whereas the patient performed these at a higher level of independence PTA. Pt will benefit from skilled PT intervention to increase independence and safety with basic mobility in preparation for discharge to the venue listed below.       Follow Up Recommendations SNF;Supervision - Intermittent    Equipment Recommendations  None recommended by PT    Recommendations for Other Services       Precautions / Restrictions Precautions Precautions: Fall Precaution Comments: R quadrant ostomy; laparotomy abdominal wound Restrictions Weight Bearing  Restrictions: No      Mobility  Bed Mobility Overal bed mobility: Needs Assistance Bed Mobility: Supine to Sit;Sit to Supine Rolling: Mod assist   Supine to sit: Mod assist     General bed mobility comments: weak, struggles with trunk/hip flexion    Transfers Overall transfer level: Needs assistance Equipment used: Rolling walker (2 wheeled) Transfers: Sit to/from Stand Sit to Stand: Supervision         General transfer comment: labored effort, good technique no LOB  Ambulation/Gait Ambulation/Gait assistance: Min guard Gait Distance (Feet): 40 Feet Assistive device: Rolling walker (2 wheeled) Gait Pattern/deviations: Step-to pattern     General Gait Details: slow, distracted, requires cues to attend to task to walking  Stairs            Wheelchair Mobility    Modified Rankin (Stroke Patients Only)       Balance                                             Pertinent Vitals/Pain Pain Assessment: No/denies pain    Home Living Family/patient expects to be discharged to:: Skilled nursing facility Living Arrangements: Alone Available Help at Discharge: Family;Available PRN/intermittently (SOn currently admitted) Type of Home: Apartment Home Access: Level entry     Home Layout: One level Home Equipment: Cane - quad Additional Comments: PT at [facility] recommending transition to 4WW    Prior Function Level of Independence: Independent with assistive device(s)         Comments: Uses QC for community ambulation, no falls reported. Does not drive.  Family assists with groceries once a month and clipping pt toenails. Does not require assist for other ADL/IADL. Members of pt's church live in her apartment and come by to check on her daily.     Hand Dominance   Dominant Hand: Right    Extremity/Trunk Assessment   Upper Extremity Assessment Upper Extremity Assessment: Generalized weakness;Overall [facility] for tasks assessed    Lower  Extremity Assessment Lower Extremity Assessment: Generalized weakness;Overall WFL for tasks assessed       Communication      Cognition Arousal/Alertness: Awake/alert Behavior During Therapy: WFL for tasks assessed/performed Overall Cognitive Status: Within Functional Limits for tasks assessed                                 General Comments: Pt reports STM deficit. Pt presents with slowed processing.      General Comments      Exercises     Assessment/Plan    PT Assessment Patient needs continued PT services  PT Problem List Decreased strength;Decreased mobility;Decreased activity tolerance;Cardiopulmonary status limiting activity;Decreased balance;Pain       PT Treatment Interventions Gait training;Functional mobility training;Therapeutic activities;Therapeutic exercise;Balance training;Neuromuscular re-education;Patient/family education;DME instruction    PT Goals (Current goals can be found in the Care Plan section)  Acute Rehab PT Goals Patient Stated Goal: improve tolerance to AMB PT Goal Formulation: With patient Time For Goal Achievement: 04/23/20 Potential to Achieve Goals: Fair    Frequency Min 2X/week   Barriers to discharge Decreased caregiver support      Co-evaluation               AM-PAC PT "6 Clicks" Mobility  Outcome Measure Help needed turning from your back to your side while in a flat bed without using bedrails?: A Lot Help needed moving from lying on your back to sitting on the side of a flat bed without using bedrails?: A Lot Help needed moving to and from a bed to a chair (including a wheelchair)?: A Little Help needed standing up from a chair using your arms (e.g., wheelchair or bedside chair)?: A Little Help needed to walk in hospital room?: A Little Help needed climbing 3-5 steps with a railing? : A Lot 6 Click Score: 15    End of Session Equipment Utilized During Treatment: Gait belt Activity Tolerance: Patient  tolerated treatment well;No increased pain Patient left: in bed;with nursing/sitter in room;with bed alarm set;Other (comment) (feet elevated, new chuck pad, HOB at 40 degrees) Nurse Communication: Mobility status PT Visit Diagnosis: Unsteadiness on feet (R26.81);Muscle weakness (generalized) (M62.81)    Time: 3833-3832 PT Time Calculation (min) (ACUTE ONLY): 25 min   Charges:   PT Evaluation $PT Eval High Complexity: 1 High PT Treatments $Therapeutic Exercise: 8-22 mins       3:31 PM, 04/09/20 Rosamaria Lints, PT, DPT Physical Therapist - [facility]  [phone] (ASCOM)   Nakeia Calvi C 04/09/2020, 3:30 PM

## 2020-04-09 NOTE — Progress Notes (Signed)
Attempted to notify sons about patient admission, unsuccessful.

## 2020-04-09 NOTE — ED Notes (Signed)
RN attempted to call son on pts demographics, at pts request. No answer at this time.

## 2020-04-09 NOTE — H&P (Addendum)
[place]   PATIENT NAME: [name]    MR#:  [id number]  DATE OF BIRTH:  02/25/1946  DATE OF ADMISSION:  04/08/2020  PRIMARY CARE PHYSICIAN: Mickel Fuchs, MD   REQUESTING/REFERRING PHYSICIAN: Nita Sickle, MD  CHIEF COMPLAINT:   Chief Complaint  Patient presents with  . Shortness of Breath  . Chest Pain    HISTORY OF PRESENT ILLNESS:  [name]  is a 74 y.o. Caucasian female coming from [place] [facility] with a known history of diastolic CHF, hypertension, IBS, CVA, seizure disorder and atrial fibrillation on Eliquis, who presented to the emergency room with acute onset of worsening dyspnea with associated orthopnea and paroxysmal nocturnal dyspnea with dyspnea on exertion over the last few days.  She denies any fever or chills.  She is having chest discomfort only with cough.  No wheezing.  She has been expectorating yellow sputum with cough for which she has been taking Mucinex.  She denies any nausea or vomiting or abdominal pain.  No bleeding diathesis.  The patient uses oxygen only nightly.  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 Augmentin.  She was admitted here severe sepsis on 10/28 and aggressively hydrated. Upon presentation to the emergency room, blood pressure was 140/90 with a heart rate of 111 and respiratory to 24 pulse currently was 94% on room air.  Labs revealed hyponatremia and hypochloremia.  BNP came back 1381.6.  CBC showed leukocytosis of 14.1 with neutrophilia and mild anemia better than previous levels.  COVID-19 PCR and influenza antigens came back negative.  Chest x-ray showed cardiomegaly with evidence of congestive heart failure with moderate to large bilateral pleural effusions right greater than left.  The patient was given 20 mg of IV Lasix and will be admitted to a progressive unit bed for further  evaluation and management. PAST MEDICAL HISTORY:   Past Medical History:  Diagnosis Date  . Acute respiratory failure (HCC)    Secondary to aspiration pneumonia   . Altered mental status    Secondary to viral herpes simplex virus encephalitis  . Anemia   . Atrial fibrillation (HCC)   . Bilateral pneumonia   . CHF (congestive heart failure) (HCC)   . Dysphasia   . Encephalitis   . Hyperglycemia   . Hypertension   . Hyponatremia   . IBS (irritable bowel syndrome)   . Seizures (HCC)   . Stroke [facility])     PAST SURGICAL HISTORY:   Past Surgical History:  Procedure Laterality Date  . CHOLECYSTECTOMY    . COLECTOMY WITH COLOSTOMY CREATION/HARTMANN PROCEDURE N/A 03/12/2020   Procedure: COLECTOMY WITH COLOSTOMY CREATION/HARTMANN PROCEDURE;  Surgeon: Campbell Lerner, MD;  Location: [facility] ORS;  Service: General;  Laterality: N/A;  . COLONOSCOPY N/A 01/17/2020   Procedure: COLONOSCOPY;  Surgeon: Regis Bill, MD;  Location: [facility] ENDOSCOPY;  Service: Endoscopy;  Laterality: N/A;  . COLONOSCOPY WITH PROPOFOL N/A 12/31/2016   Procedure: COLONOSCOPY WITH PROPOFOL;  Surgeon: Scot Jun, MD;  Location: [facility] ENDOSCOPY;  Service: Endoscopy;  Laterality: N/A;  . ESOPHAGOGASTRODUODENOSCOPY (EGD) WITH PROPOFOL N/A 12/31/2016   Procedure: ESOPHAGOGASTRODUODENOSCOPY (EGD) WITH PROPOFOL;  Surgeon: Scot Jun, MD;  Location[facility] END[facility]COPY;  Service: Endoscopy;  Laterality: N/A;  . LAPAROTOMY N/A 03/12/2020   Procedure: EXPLORATORY LAPAROTOMY;  Surgeon: Campbell Lerner, MD;  Location: [facility] ORS;  Service: General;  Laterality: N/A;  .  TONSILLECTOMY    . TUBAL LIGATION      SOCIAL HISTORY:   Social History   Tobacco Use  . Smoking status: Never Smoker  . Smokeless tobacco: Never Used  Substance Use Topics  . Alcohol use: No    FAMILY HISTORY:   Family History  Problem Relation Age of Onset  . Prostate cancer Father   . Breast cancer Maternal Aunt   . Breast cancer  Maternal Aunt   . Cerebral palsy Son     DRUG ALLERGIES:   Allergies  Allergen Reactions  . Prednisone Hives  . Predicort [Prednisolone Acetate]     HIVES,    REVIEW OF SYSTEMS:   ROS As per history of present illness. All pertinent systems were reviewed above. Constitutional, HEENT, cardiovascular, respiratory, GI, GU, musculoskeletal, neuro, psychiatric, endocrine, integumentary and hematologic systems were reviewed and are otherwise negative/unremarkable except for positive findings mentioned above in the HPI.   MEDICATIONS AT HOME:   Prior to Admission medications   Medication Sig Start Date End Date Taking? Authorizing Provider  acetaminophen (TYLENOL) 325 MG tablet Take 650 mg by mouth every 6 (six) hours as needed.    [provider]  amiodarone (PACERONE) 200 MG tablet Take 1 tablet (200 mg total) by mouth daily. 03/20/20   Darlin Priestly, MD  apixaban (ELIQUIS) 5 MG TABS tablet Take 1 tablet (5 mg total) by mouth 2 (two) times daily. 03/20/19   Altamese Dilling, MD  diltiazem (CARDIZEM CD) 120 MG 24 hr capsule Take 1 capsule (120 mg total) by mouth daily. 04/03/20   Alford Highland, MD  Ensure Max Protein (ENSURE MAX PROTEIN) LIQD Take 330 mLs (11 oz total) by mouth 2 (two) times daily. 03/20/20   Darlin Priestly, MD  folic acid (FOLVITE) 1 MG tablet Take 1 mg by mouth daily. Per tube once daily      [provider]  furosemide (LASIX) 20 MG tablet Take 0.5 tablets (10 mg total) by mouth daily. 04/03/20   Alford Highland, MD  hydrocerin (EUCERIN) CREA Apply 1 application topically 2 (two) times daily. 04/03/20   Alford Highland, MD  hydrocortisone (ANUSOL-HC) 2.5 % rectal cream Apply 1 application topically 2 (two) times daily as needed. 02/28/20   [provider]  levothyroxine (SYNTHROID) 75 MCG tablet Take 75 mcg by mouth daily before breakfast.     [provider]  metoprolol succinate (TOPROL-XL) 50 MG 24 hr tablet Take 1 tablet (50 mg  total) by mouth 2 (two) times daily. Take with or immediately following a meal. 04/03/20   Alford Highland, MD  midodrine (PROAMATINE) 2.5 MG tablet Take 1 tablet (2.5 mg total) by mouth 3 (three) times daily with meals. 04/03/20   Alford Highland, MD  Multiple Vitamin (MULTIVITAMIN WITH MINERALS) TABS tablet Take 1 tablet by mouth daily. 03/20/20   Darlin Priestly, MD  omeprazole (PRILOSEC) 20 MG capsule Take 20 mg by mouth daily.    [provider]  potassium chloride (KLOR-CON) 10 MEQ tablet Take 10 mEq by mouth daily.    [provider]      VITAL SIGNS:  Blood pressure 111/87, pulse (!) 108, temperature 97.6 F (36.4 C), resp. rate 17, SpO2 97 %.  PHYSICAL EXAMINATION:  Physical Exam  GENERAL:  74 y.o.-year-old Caucasian female patient lying in the bed with mild respiratory distress with conversational dyspnea. EYES: Pupils equal, round, reactive to light and accommodation. No scleral icterus. Extraocular muscles intact.  HEENT: Head atraumatic, normocephalic. Oropharynx and nasopharynx  clear.  NECK:  Supple, no jugular venous distention. No thyroid enlargement, no tenderness.  LUNGS: Diminished bibasal breath sounds with bibasal rales.   CARDIOVASCULAR: Regular rate and rhythm, S1, S2 normal. No murmurs, rubs, or gallops.  ABDOMEN: Soft, nondistended, nontender. Bowel sounds present. No organomegaly or mass.  Colostomy is in place with brown stools. EXTREMITIES: 1+ bilateral lower extremity pitting edema with no cyanosis, or clubbing.  NEUROLOGIC: Cranial nerves II through XII are intact. Muscle strength 5/5 in all extremities. Sensation intact. Gait not checked.  PSYCHIATRIC: The patient is alert and oriented x 3.  Normal affect and good eye contact. SKIN: No obvious rash, lesion, or ulcer.   LABORATORY PANEL:   CBC Recent Labs  Lab 04/08/20 2350  WBC 14.1*  HGB 10.8*  HCT 33.6*  PLT 313    ------------------------------------------------------------------------------------------------------------------  Chemistries  Recent Labs  Lab 04/08/20 2350  NA 129*  K 4.8  CL 97*  CO2 22  GLUCOSE 117*  BUN 11  CREATININE 0.81  CALCIUM 8.3*  AST 26  ALT 34  ALKPHOS 107  BILITOT 0.8   ------------------------------------------------------------------------------------------------------------------  Cardiac Enzymes No results for input(s): TROPONINI in the last 168 hours. ------------------------------------------------------------------------------------------------------------------  RADIOLOGY:  DG Chest Port 1 View  Result Date: 04/09/2020 CLINICAL DATA:  Shortness of breath EXAM: PORTABLE CHEST 1 VIEW COMPARISON:  Chest x-ray dated 03/28/2020 FINDINGS: There is cardiomegaly with evidence for congestive heart failure. There are moderate to large bilateral pleural effusions, right greater than left. There is no pneumothorax. Bibasilar airspace disease is noted and favored to represent atelectasis. IMPRESSION: 1. Cardiomegaly with evidence for congestive heart failure. 2. Moderate to large bilateral pleural effusions, right greater than left. Electronically Signed   By: Katherine Mantlehristopher  Green M.D.   On: 04/09/2020 00:27      IMPRESSION AND PLAN:   1.  Acute on chronic diastolic CHF possibly secondary to recent fluid overload. -The patient will be admitted to a progressive unit bed. -We will diurese with IV Lasix. -We will follow serial troponin I's. -She had a 2D echo on 12/06/2019 that revealed an EF of 55 to 60% with left atrial volume moderate dilatation as well as right atrial mild to moderate dilatation, mild mitral regurgitation and trivial aortic regurgitation.  We will not therefore repeat echo at this time. -Cardiology consult to be obtained. -I notified Dr. Darrold JunkerParaschos about the patient.  2.  Paroxysmal atrial fibrillation. -We will continue amiodarone, Cardizem  CD, Toprol-XL and Eliquis.  3.  Essential hypertension. -We will continue Toprol-XL and Cardizem CD.  4.  Hypothyroidism. -We will continue Synthroid and check TSH level.  5.  GERD. -PPI therapy will be resumed.  6.  DVT prophylaxis. The subtenons Lovenox.   All the records are reviewed and case discussed with ED provider. The plan of care was discussed in details with the patient (and family). I answered all questions. The patient agreed to proceed with the above mentioned plan. Further management will depend upon hospital course.   CODE STATUS: Full code  Status is: Inpatient  Remains inpatient appropriate because:Ongoing diagnostic testing needed not appropriate for outpatient work up, Unsafe d/c plan, IV treatments appropriate due to intensity of illness or inability to take PO and Inpatient level of care appropriate due to severity of illness   Dispo: The patient is from: ALF              Anticipated d/c is to: ALF  Anticipated d/c date is: 2 days              Patient currently is not medically stable to d/c.   TOTAL TIME TAKING CARE OF THIS PATIENT: 55 minutes.    Hannah Beat M.D on 04/09/2020 at 2:04 AM  [facility]   From 7 PM-7 AM, contact night-coverage [email or web address]  CC: Primary care physician; Mickel Fuchs, MD

## 2020-04-09 NOTE — ED Notes (Signed)
RN called [employer] and informed them pt will be admitted. RN talked with Tammy.

## 2020-04-09 NOTE — ED Provider Notes (Signed)
[facility] Emergency Department Provider Note  ____________________________________________  Time seen: Approximately 12:41 AM  I have reviewed the triage vital signs and the nursing notes.   HISTORY  Chief Complaint Shortness of Breath and Chest Pain   HPI [name] is a 74 y.o. female 74 y.o. female with history of A. fib on Eliquis and amiodarone, HTN, diastolic CHF, status post recent subtotal colectomy on 03/12/20 for colonic obstruction from stricture from diverticulitis, with postop complication of wound infectionwho presents form [facility] for SOB and chest tightness. Patient recently admitted for severe sepsis from 10/27 to 11/2. Patient has noticed swelling of bilateral LE and feet. This evening as she laid down to sleep she started to feel SOB and developed chest tightness. No fever, cough, abd pain, N/V/D. SOB is better with her sitting up.    Past Medical History:  Diagnosis Date  . Acute respiratory failure (HCC)    Secondary to aspiration pneumonia   . Altered mental status    Secondary to viral herpes simplex virus encephalitis  . Anemia   . Atrial fibrillation (HCC)   . Bilateral pneumonia   . CHF (congestive heart failure) (HCC)   . Dysphasia   . Encephalitis   . Hyperglycemia   . Hypertension   . Hyponatremia   . IBS (irritable bowel syndrome)   . Seizures (HCC)   . Stroke [facility])     Patient Active Problem List   Diagnosis Date Noted  . Nocturnal hypoxia   . Atrial fibrillation, chronic (HCC)   . Weakness   . Severe sepsis (HCC) 03/29/2020  .  Possible Abscess of postoperative wound of abdominal wall 03/29/2020  . S/P laparotomy 03/12/20  03/29/2020  . History of CVA (cerebrovascular accident) 03/29/2020  . Hypotension   . Malnutrition of moderate degree 03/19/2020  . Hyponatremia 03/12/2020  . Chronic diastolic heart failure (HCC) 03/12/2020  . Large bowel obstruction (HCC) 03/12/2020  . Abdominal  distention 03/11/2020  . Iron deficiency anemia 12/20/2019  . Acute on chronic diastolic CHF (congestive heart failure) (HCC) 12/04/2019  . Atrial fibrillation with RVR (HCC)   . Hypertension   . Stroke (HCC)   . Hypothyroidism   . Acute respiratory failure with hypoxia (HCC)   . Acute congestive heart failure (HCC) 03/17/2019  . Acute gastroenteritis 07/05/2017  . Acute respiratory failure (HCC) 03/27/2011    Past Surgical History:  Procedure Laterality Date  . CHOLECYSTECTOMY    . COLECTOMY WITH COLOSTOMY CREATION/HARTMANN PROCEDURE N/A 03/12/2020   Procedure: COLECTOMY WITH COLOSTOMY CREATION/HARTMANN PROCEDURE;  Surgeon: Campbell Lerner, MD;  Location: [facility] ORS;  Service: General;  Laterality: N/A;  . COLONOSCOPY N/A 01/17/2020   Procedure: COLONOSCOPY;  Surgeon: Regis Bill, MD;  Location: [facility] ENDOSCOPY;  Service: Endoscopy;  Laterality: N/A;  . COLONOSCOPY WITH PROPOFOL N/A 12/31/2016   Procedure: COLONOSCOPY WITH PROPOFOL;  Surgeon: Scot Jun, MD;  Location: [facility] ENDOSCOPY;  Service: Endoscopy;  Laterality: N/A;  . ESOPHAGOGASTRODUODENOSCOPY (EGD) WITH PROPOFOL N/A 12/31/2016   Procedure: ESOPHAGOGASTRODUODENOSCOPY (EGD) WITH PROPOFOL;  Surgeon: Scot Jun, MD;  Location: [facility] ENDOSCOPY;  Service: Endoscopy;  Laterality: N/A;  . LAPAROTOMY N/A 03/12/2020   Procedure: EXPLORATORY LAPAROTOMY;  Surgeon: Campbell Lerner, MD;  Location: [facility] ORS;  Service: General;  Laterality: N/A;  . TONSILLECTOMY    . TUBAL LIGATION      Prior to Admission medications   Medication Sig Start Date End Date Taking? Authorizing Provider  acetaminophen (TYLENOL) 325 MG tablet  Take 650 mg by mouth every 6 (six) hours as needed.    [provider]  amiodarone (PACERONE) 200 MG tablet Take 1 tablet (200 mg total) by mouth daily. 03/20/20   Darlin Priestly, MD  apixaban (ELIQUIS) 5 MG TABS tablet Take 1 tablet (5 mg total) by mouth 2 (two) times daily. 03/20/19   Altamese Dilling, MD  diltiazem (CARDIZEM CD) 120 MG 24 hr capsule Take 1 capsule (120 mg total) by mouth daily. 04/03/20   Alford Highland, MD  Ensure Max Protein (ENSURE MAX PROTEIN) LIQD Take 330 mLs (11 oz total) by mouth 2 (two) times daily. 03/20/20   Darlin Priestly, MD  folic acid (FOLVITE) 1 MG tablet Take 1 mg by mouth daily. Per tube once daily      [provider]  furosemide (LASIX) 20 MG tablet Take 0.5 tablets (10 mg total) by mouth daily. 04/03/20   Alford Highland, MD  hydrocerin (EUCERIN) CREA Apply 1 application topically 2 (two) times daily. 04/03/20   Alford Highland, MD  hydrocortisone (ANUSOL-HC) 2.5 % rectal cream Apply 1 application topically 2 (two) times daily as needed. 02/28/20   [provider]  levothyroxine (SYNTHROID) 75 MCG tablet Take 75 mcg by mouth daily before breakfast.     [provider]  metoprolol succinate (TOPROL-XL) 50 MG 24 hr tablet Take 1 tablet (50 mg total) by mouth 2 (two) times daily. Take with or immediately following a meal. 04/03/20   Alford Highland, MD  midodrine (PROAMATINE) 2.5 MG tablet Take 1 tablet (2.5 mg total) by mouth 3 (three) times daily with meals. 04/03/20   Alford Highland, MD  Multiple Vitamin (MULTIVITAMIN WITH MINERALS) TABS tablet Take 1 tablet by mouth daily. 03/20/20   Darlin Priestly, MD  omeprazole (PRILOSEC) 20 MG capsule Take 20 mg by mouth daily.    [provider]  potassium chloride (KLOR-CON) 10 MEQ tablet Take 10 mEq by mouth daily.    [provider]    Allergies Prednisone and Predicort [prednisolone acetate]  Family History  Problem Relation Age of Onset  . Prostate cancer Father   . Breast cancer Maternal Aunt   . Breast cancer Maternal Aunt   . Cerebral palsy Son     Social History Social History   Tobacco Use  . Smoking status: Never Smoker  . Smokeless tobacco: Never Used  Vaping Use  . Vaping Use: Never used  Substance Use Topics  . Alcohol use: No  . Drug  use: No    Review of Systems Constitutional: Negative for fever. Eyes: Negative for visual changes. ENT: Negative for sore throat. Neck: No neck pain  Cardiovascular: Negative for chest pain. + chest tightness Respiratory: + shortness of breath. Gastrointestinal: Negative for abdominal pain, vomiting or diarrhea. Genitourinary: Negative for dysuria. Musculoskeletal: Negative for back pain. Skin: Negative for rash. Neurological: Negative for headaches, weakness or numbness. Psych: No SI or HI ____________________________________________   PHYSICAL EXAM:  VITAL SIGNS: ED Triage Vitals  Enc Vitals Group     BP 04/08/20 2343 (!) 114/92     Pulse Rate 04/08/20 2343 (!) 111     Resp 04/08/20 2343 (!) 24     Temp 04/09/20 0000 97.6 F (36.4 C)     Temp src --      SpO2 04/08/20 2342 95 %     Weight --      Height --      Head Circumference --      Peak Flow --  Pain Score 04/08/20 2346 3     Pain Loc --      Pain Edu? --      Excl. in GC? --     Constitutional: Alert and oriented. Well appearing and in no apparent distress. HEENT:      Head: Normocephalic and atraumatic.         Eyes: Conjunctivae are normal. Sclera is non-icteric.       Mouth/Throat: Mucous membranes are moist.       Neck: Supple with no signs of meningismus. Cardiovascular: Irregularly irregular rhythm with mildly tachycardic rate Respiratory: Normal respiratory effort. Lungs are clear to auscultation bilaterally. No wheezes, crackles, or rhonchi.  Gastrointestinal: Soft, non tender, and non distended. Surgical wound looks well Musculoskeletal: 2+ pitting edema of b/l LE Neurologic: Normal speech and language. Face is symmetric. Moving all extremities. No gross focal neurologic deficits are appreciated. Skin: Skin is warm, dry and intact. No rash noted. Psychiatric: Mood and affect are normal. Speech and behavior are normal. ____________________________________________   LABS (all labs ordered  are listed, but only abnormal results are displayed)  Labs Reviewed  CBC WITH DIFFERENTIAL/PLATELET - Abnormal; Notable for the following components:      Result Value   WBC 14.1 (*)    Hemoglobin 10.8 (*)    HCT 33.6 (*)    RDW 16.9 (*)    Neutro Abs 11.5 (*)    Abs Immature Granulocytes 0.16 (*)    All other components within normal limits  COMPREHENSIVE METABOLIC PANEL - Abnormal; Notable for the following components:   Sodium 129 (*)    Chloride 97 (*)    Glucose, Bld 117 (*)    Calcium 8.3 (*)    Total Protein 6.3 (*)    Albumin 2.7 (*)    All other components within normal limits  BRAIN NATRIURETIC PEPTIDE - Abnormal; Notable for the following components:   B Natriuretic Peptide 1,381.6 (*)    All other components within normal limits  RESP PANEL BY RT PCR (RSV, FLU A&B, COVID)  URINALYSIS, COMPLETE (UACMP) WITH MICROSCOPIC  TROPONIN I (HIGH SENSITIVITY)   ____________________________________________  EKG  ED ECG REPORT I, Nita Sickle, the attending physician, personally viewed and interpreted this ECG.  A. fib, rate of 103, prolonged QTC, right axis deviation, no ST elevation.  No significant changes when compared to prior. ____________________________________________  RADIOLOGY  I have personally reviewed the images performed during this visit and I agree with the Radiologist's read.   Interpretation by Radiologist:  DG Chest Port 1 View  Result Date: 04/09/2020 CLINICAL DATA:  Shortness of breath EXAM: PORTABLE CHEST 1 VIEW COMPARISON:  Chest x-ray dated 03/28/2020 FINDINGS: There is cardiomegaly with evidence for congestive heart failure. There are moderate to large bilateral pleural effusions, right greater than left. There is no pneumothorax. Bibasilar airspace disease is noted and favored to represent atelectasis. IMPRESSION: 1. Cardiomegaly with evidence for congestive heart failure. 2. Moderate to large bilateral pleural effusions, right greater than  left. Electronically Signed   By: Katherine Mantle M.D.   On: 04/09/2020 00:27     ____________________________________________   PROCEDURES  Procedure(s) performed:yes .1-3 Lead EKG Interpretation Performed by: Nita Sickle, MD Authorized by: Nita Sickle, MD     Interpretation: abnormal     ECG rate assessment: tachycardic     Rhythm: atrial fibrillation     Ectopy: none     Critical Care performed: yes  CRITICAL CARE Performed by: Nita Sickle  ?  Total critical care time: 40 min  Critical care time was exclusive of separately billable procedures and treating other patients.  Critical care was necessary to treat or prevent imminent or life-threatening deterioration.  Critical care was time spent personally by me on the following activities: development of treatment plan with patient and/or surrogate as well as nursing, discussions with consultants, evaluation of patient's response to treatment, examination of patient, obtaining history from patient or surrogate, ordering and performing treatments and interventions, ordering and review of laboratory studies, ordering and review of radiographic studies, pulse oximetry and re-evaluation of patient's condition.  ____________________________________________   INITIAL IMPRESSION / ASSESSMENT AND PLAN / ED COURSE  74 y.o. female with history of A. fib on Eliquis and amiodarone, HTN, diastolic CHF, status post recent subtotal colectomy on 03/12/20 for colonic obstruction from stricture from diverticulitis, with postop complication of wound infectionwho presents form [facility] for SOB and chest tightness.  Patient arrives in A. fib with rate in the low 100s, afebrile with normal blood pressure.  Old medical records reviewed showing recent admission for almost a week in the setting of severe sepsis of unknown source for which she received a large amount of fluids.  She does look volume overloaded with edema  in her bilateral lower extremity.  Differential diagnoses including CHF exacerbation versus viral illness versus pneumonia versus ACS versus PE versus sepsis.  Patient placed on telemetry for close monitoring.  Will get CBC, CMP, troponin, BNP, EKG, chest x-ray, urinalysis, Covid/flu swab.  _________________________ 1:32 AM on 04/09/2020 -----------------------------------------  Work-up consistent with CHF exacerbation.  Chest x-ray visualized by me showing cardiomegaly, effusions, and edema, confirmed by radiology.  BNP is elevated at 1381.  Patient does have a white count of 14.1 but no fever and no other signs of sepsis at this time.  Urinalysis is pending.  Patient is on 10 mg of Lasix daily.  Will give 20 mg of IV Lasix for diuresis.  Patient now requiring 2 L of oxygen for mild hypoxia with sats in the upper 80s.  Will discuss with the hospitalist for admission for diuresis.     _____________________________________________ Please note:  Patient was evaluated in Emergency Department today for the symptoms described in the history of present illness. Patient was evaluated in the context of the global COVID-19 pandemic, which necessitated consideration that the patient might be at risk for infection with the SARS-CoV-2 virus that causes COVID-19. Institutional protocols and algorithms that pertain to the evaluation of patients at risk for COVID-19 are in a state of rapid change based on information released by regulatory bodies including the CDC and federal and state organizations. These policies and algorithms were followed during the patient's care in the ED.  Some ED evaluations and interventions may be delayed as a result of limited staffing during the pandemic.   [place] Controlled Substance Database was reviewed by me. ____________________________________________   FINAL CLINICAL IMPRESSION(S) / ED DIAGNOSES   Final diagnoses:  Acute on chronic congestive heart failure, unspecified  heart failure type (HCC)  Acute respiratory failure with hypoxia (HCC)      NEW MEDICATIONS STARTED DURING THIS VISIT:  ED Discharge Orders    None       Note:  This document was prepared using Dragon voice recognition software and may include unintentional dictation errors.    Don PerkingVeronese, [place], MD 04/09/20 [phone]

## 2020-04-09 NOTE — Plan of Care (Signed)
  Problem: Education: Goal: Knowledge of General Education information will improve Description: Including pain rating scale, medication(s)/side effects and non-pharmacologic comfort measures Outcome: Progressing   Problem: Health Behavior/Discharge Planning: Goal: Ability to manage health-related needs will improve Outcome: Progressing   Problem: Clinical Measurements: Goal: Ability to maintain clinical measurements within normal limits will improve Outcome: Progressing Goal: Will remain free from infection Outcome: Progressing Goal: Diagnostic test results will improve Outcome: Progressing Goal: Respiratory complications will improve Outcome: Progressing Goal: Cardiovascular complication will be avoided Outcome: Progressing   Problem: Activity: Goal: Risk for activity intolerance will decrease Outcome: Progressing   Problem: Education: Goal: Ability to demonstrate management of disease process will improve Outcome: Progressing Goal: Ability to verbalize understanding of medication therapies will improve Outcome: Progressing Goal: Individualized Educational Video(s) Outcome: Progressing   Problem: Activity: Goal: Capacity to carry out activities will improve Outcome: Progressing   Problem: Cardiac: Goal: Ability to achieve and maintain adequate cardiopulmonary perfusion will improve Outcome: Progressing   

## 2020-04-09 NOTE — Plan of Care (Signed)
  Problem: Education: Goal: Knowledge of General Education information will improve Description: Including pain rating scale, medication(s)/side effects and non-pharmacologic comfort measures Outcome: Progressing Note: Patient profile completed. Patient complained of abdominal pain but refused any medication. Patient has an open incision with tunneling. Patient complains of orthopnea.

## 2020-04-09 NOTE — Progress Notes (Signed)
   04/09/20 1000  Clinical Encounter Type  Visited With Patient  Visit Type Initial;Spiritual support;Social support  Referral From Nurse  Consult/Referral To Chaplain  Pt requested bible and daily bread per OR. Ch gave Pt bible, daily bread and prayed with Pt. Ch will follow-up with Pt.

## 2020-04-09 NOTE — Progress Notes (Addendum)
   Heart Failure Nurse Navigator Note  HpEF 55-60 %, right ventricular systolic function is normal.  Mildly elevated PASP.  Mild to moderate right atrial enlargement, moderate left atrial enlargement, moderate mitral valve prolapse, mild MR, mild TR.  She presented to the ED with complaints of worsening SOB. CXR revealed cardiomegaly with moderate to large pleural effusions, right greater than left.  Co morbidities:  Hypertension Atrial fibrillation Anemia    Medications:  Eliquis 5 mg BID ASA 81 mg daily Diltiazem 120 mg daily Lasix 40 mg IV BID Metoprolol succinate 50 mg Bid Midodrine 5 mg with meals Potassium 10 meq daily   Labs:  BMP pending, magnesium 2.0, BNP 1381   Intake not recorded Output 900 ml  Reds clip reading 41 Weight 67.4 BMI 24.7  Monitor sinus rhythm with frequent PVCs  BP 105/76 pulse 101  Assessment:   General- she is awake and alert, lying in bed, c/o being SOB, 02 saturations 99%.  HEENT-pupils equal, no JVD  Cardiac- heart tones regular +murmur  Chest- breath sounds clear.  Abdomen- soft, non tender  Musculoskeletal- no lower extremity edema noted.  Psych- is pleasant and appropriate  Neuro- moves all extremities without difficulty    Discussed heart failure with patient and how she takes care of herself at home.  She states she plans her meals, makes a list and her son, [name] orders from [employer].  She eats a lot of lean cuisine and healthy choice.  States that she reads labels and keeps in sodium intake less than 2000 mg daily.  When asked about fluid intake she states she drinks a lot --could not give me an amount another than saying a lot. She noted when she is drinking a diet MT. Dew she also has a glass of water with it, the same when is drinking ice tea.   Discussed limiting it to 8-8 oz cups daily or 64 ounces.  She has the zone magnet on her frig at home, but did not think she has the teaching booklet.  Given booklet  with restrictions written on the cover.  Will continue to follow.  Tresa Endo RN, CHFN

## 2020-04-09 NOTE — Progress Notes (Signed)
Mobility Specialist - Progress Note   04/09/20 1127  Mobility  Activity Dangled on edge of bed;Sat and stood x 3 (seated exercises and lateral shuffle steps towards HOB)  Level of Assistance Contact guard assist, steadying assist  Assistive Device Front wheel walker  Mobility Response Tolerated well  Mobility performed by Mobility specialist  $Mobility charge 1 Mobility    Pre-mobility: 107 HR, 96% SpO2 Post-mobility: 100 HR, 94% SpO2   Pt laying in bed upon arrival. Pt agreed to session. Pt able to get to EOB SBA. Pt c/o feeling "tense" and "light-headed" sitting EOB. Pt dangled EOB for couple of mins. Pt progressed to seated exercises: straight arm raise x 5, toe touches x 5, kicks x 10, marches x 10. Pt S2S x 3 to RW w/ CGA. Pt c/o slight SOB. Easily managed by PLB. O2 sat >/= 93% t/o session. Pt on RA. Slight LOB noted during S2S 1st attempt. Corrected by VCs for hand placement and safety. At the end of session, pt able to take R lateral shuffling steps towards HOB w CGA. Overall, pt tolerated session well. Pt left laying in bed w/ alarm set. Call bell and phone placed in reach. Nurse was notified.    Lannie Yusuf Mobility Specialist  04/09/20, 11:34 AM

## 2020-04-09 NOTE — Consult Note (Signed)
CARDIOLOGY CONSULT NOTE               Patient ID: [name] MRN: [id number] DOB/AGE: Aug 25, 1945 74 y.o.  Admit date: 04/08/2020 Referring Physician Fabienne Bruns, MD Primary Physician [facility] Primary Cardiologist Paras[place] Reason for Consultation acute on chronic diastolic CHF  HPI: 74 year old female referred for evaluation of acute on chronic diastolic congestive heart failure. The patient has a history of paroxsymal atrial fibrillation on Eliquis, moderate mitral valve prolapse with mild regurgitation, essential hypertension, history of stroke, and pulmonary hypertension. 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 During the admission for sepsis, she was aggressively hydrated. The patient presented to [facility] ER 04/08/2020 from [facility] where she currently resides for rehabilitation, for a few day history of worsening shortness of breath, peripheral edema, orthopnea, and palpitations with tachycardia. She wears supplemental oxygen at night. She was hypoxic with oxygen saturations in the upper 80s, and was placed on 2L O2. ECG revealed atrial fibrillation at a rate of 103 bpm without acute ST wave abnormalities. Chest xray showed cardiomegaly with moderate to large bilateral pleural effusion, right greater than left. BNP elevated to 1381, troponin normal, WBC 14.1, TSH 7.474 (up from 1.828 three weeks ago).  Her dose of levothyroxine was increased. The patient was started on IV Lasix. At this time, she denies palpitations or chest pain.  Review of systems complete and found to be negative unless listed above     Past Medical History:  Diagnosis Date  . Acute respiratory failure (HCC)    Secondary to aspiration pneumonia   . Altered mental status    Secondary to viral herpes simplex virus encephalitis  . Anemia   . Atrial  fibrillation (HCC)   . Bilateral pneumonia   . CHF (congestive heart failure) (HCC)   . Dysphasia   . Encephalitis   . Hyperglycemia   . Hypertension   . Hyponatremia   . IBS (irritable bowel syndrome)   . Seizures (HCC)   . Stroke [facility])     Past Surgical History:  Procedure Laterality Date  . CHOLECYSTECTOMY    . COLECTOMY WITH COLOSTOMY CREATION/HARTMANN PROCEDURE N/A 03/12/2020   Procedure: COLECTOMY WITH COLOSTOMY CREATION/HARTMANN PROCEDURE;  Surgeon: Campbell Lerner, MD;  Location: [facility] ORS;  Service: General;  Laterality: N/A;  . COLONOSCOPY N/A 01/17/2020   Procedure: COLONOSCOPY;  Surgeon: Regis Bill, MD;  Location: [facility] ENDOSCOPY;  Service: Endoscopy;  Laterality: N/A;  . COLONOSCOPY WITH PROPOFOL N/A 12/31/2016   Procedure: COLONOSCOPY WITH PROPOFOL;  Surgeon: Scot Jun, MD;  Location: [facility] ENDOSCOPY;  Service: Endoscopy;  Laterality: N/A;  . ESOPHAGOGASTRODUODENOSCOPY (EGD) WITH PROPOFOL N/A 12/31/2016   Procedure: ESOPHAGOGASTRODUODENOSCOPY (EGD) WITH PROPOFOL;  Surgeon: Scot Jun, MD;  Location: [facility] ENDOSCOPY;  Service: Endoscopy;  Laterality: N/A;  . LAPAROTOMY N/A 03/12/2020   Procedure: EXPLORATORY LAPAROTOMY;  Surgeon: Campbell Lerner, MD;  Location: [facility] ORS;  Service: General;  Laterality: N/A;  . TONSILLECTOMY    . TUBAL LIGATION      Medications Prior to Admission  Medication Sig Dispense Refill Last Dose  . acetaminophen (TYLENOL) 325 MG tablet Take 650 mg by mouth every 6 (six) hours as needed.   prn at prn  . amiodarone (PACERONE) 200 MG tablet Take 1 tablet (200 mg total) by mouth daily.   Unknown at Unknown  . apixaban (ELIQUIS) 5 MG TABS tablet Take 1  tablet (5 mg total) by mouth 2 (two) times daily. 60 tablet 0 Unknown at Unknown  . diltiazem (CARDIZEM CD) 120 MG 24 hr capsule Take 1 capsule (120 mg total) by mouth daily. 30 capsule 0 Unknown at Unknown  . folic acid (FOLVITE) 1 MG tablet Take 1 mg by mouth daily. Per tube once  daily     Unknown at Unknown  . furosemide (LASIX) 20 MG tablet Take 0.5 tablets (10 mg total) by mouth daily. 30 tablet 0 Unknown at Unknown  . hydrocerin (EUCERIN) CREA Apply 1 application topically 2 (two) times daily. 113 g 0 Unknown at Unknown  . levothyroxine (SYNTHROID) 75 MCG tablet Take 75 mcg by mouth daily before breakfast.    Unknown at Unknown  . metoprolol succinate (TOPROL-XL) 50 MG 24 hr tablet Take 1 tablet (50 mg total) by mouth 2 (two) times daily. Take with or immediately following a meal. 60 tablet 0 Unknown at Unknown  . midodrine (PROAMATINE) 2.5 MG tablet Take 1 tablet (2.5 mg total) by mouth 3 (three) times daily with meals. 90 tablet 0 Unknown at Unknown  . Multiple Vitamin (MULTIVITAMIN WITH MINERALS) TABS tablet Take 1 tablet by mouth daily.   Unknown at Unknown  . omeprazole (PRILOSEC) 20 MG capsule Take 20 mg by mouth daily.   Unknown at Unknown  . potassium chloride (KLOR-CON) 10 MEQ tablet Take 10 mEq by mouth daily.   Unknown at Unknown  . Ensure Max Protein (ENSURE MAX PROTEIN) LIQD Take 330 mLs (11 oz total) by mouth 2 (two) times daily.   N/A at N/A  . hydrocortisone (ANUSOL-HC) 2.5 % rectal cream Apply 1 application topically 2 (two) times daily as needed. (Patient not taking: Reported on 04/09/2020)   Completed Course at Unknown time   Social History   Socioeconomic History  . Marital status: Widowed    Spouse name: Not on file  . Number of children: 2  . Years of education: Not on file  . Highest education level: Not on file  Occupational History  . Occupation: medical transcript  Tobacco Use  . Smoking status: Never Smoker  . Smokeless tobacco: Never Used  Vaping Use  . Vaping Use: Never used  Substance and Sexual Activity  . Alcohol use: No  . Drug use: No  . Sexual activity: Never  Other Topics Concern  . Not on file  Social History Narrative   Pt resides at [facility] at this time. Son is Power of [employer].   Social  Determinants of Health   Financial Resource Strain:   . Difficulty of Paying Living Expenses: Not on file  Food Insecurity:   . Worried About Programme researcher, broadcasting/film/video in the Last Year: Not on file  . Ran Out of Food in the Last Year: Not on file  Transportation Needs:   . Lack of Transportation (Medical): Not on file  . Lack of Transportation (Non-Medical): Not on file  Physical Activity:   . Days of Exercise per Week: Not on file  . Minutes of Exercise per Session: Not on file  Stress:   . Feeling of Stress : Not on file  Social Connections:   . Frequency of Communication with Friends and Family: Not on file  . Frequency of Social Gatherings with Friends and Family: Not on file  . Attends Religious Services: Not on file  . Active Member of Clubs or Organizations: Not on file  . Attends Banker Meetings: Not on file  .  Marital Status: Not on file  Intimate Partner Violence:   . Fear of Current or Ex-Partner: Not on file  . Emotionally Abused: Not on file  . Physically Abused: Not on file  . Sexually Abused: Not on file    Family History  Problem Relation Age of Onset  . Prostate cancer Father   . Breast cancer Maternal Aunt   . Breast cancer Maternal Aunt   . Cerebral palsy Son       Review of systems complete and found to be negative unless listed above      PHYSICAL EXAM  General: Well developed, well nourished, in no acute distress HEENT:  Normocephalic and atramatic Neck:  No JVD.  Lungs: slight increased effort of breathing on supplemental oxygen Heart: Irregularly irregular, 2/6 systolic murmur Msk:  No obvious deformities Extremities: No clubbing, cyanosis, with trivial pedal edema.   Neuro: Alert and oriented X 3. Psych:  Good affect, responds appropriately  Labs:   Lab Results  Component Value Date   WBC 13.2 (H) 04/09/2020   HGB 11.1 (L) 04/09/2020   HCT 34.6 (L) 04/09/2020   MCV 82.4 04/09/2020   PLT 311 04/09/2020    Recent Labs   Lab 04/08/20 2350  NA 129*  K 4.8  CL 97*  CO2 22  BUN 11  CREATININE 0.81  CALCIUM 8.3*  PROT 6.3*  BILITOT 0.8  ALKPHOS 107  ALT 34  AST 26  GLUCOSE 117*   Lab Results  Component Value Date   CKTOTAL 70 08/29/2011   CKMB 3.3 08/29/2011   TROPONINI 0.07 (HH) 07/05/2017    Lab Results  Component Value Date   CHOL 159 03/17/2019   CHOL 222 (H) 08/28/2011   Lab Results  Component Value Date   HDL 46 03/17/2019   HDL 55 08/28/2011   Lab Results  Component Value Date   LDLCALC 92 03/17/2019   LDLCALC 142 (H) 08/28/2011   Lab Results  Component Value Date   TRIG 103 03/17/2019   TRIG 127 08/28/2011   Lab Results  Component Value Date   CHOLHDL 3.5 03/17/2019   No results found for: LDLDIRECT    Radiology: DG Chest 1 View  Result Date: 03/13/2020 CLINICAL DATA:  Tachycardia EXAM: CHEST  1 VIEW COMPARISON:  02/22/2020 FINDINGS: There is cardiomegaly with small pleural effusions. No overt pulmonary edema. IMPRESSION: Cardiomegaly with small pleural effusions. Electronically Signed   By: Deatra RobinsonKevin  Herman M.D.   On: 03/13/2020 03:27   DG Chest 2 View  Result Date: 03/19/2020 CLINICAL DATA:  Pneumonia. EXAM: CHEST - 2 VIEW COMPARISON:  March 13, 2020. FINDINGS: Stable cardiomegaly. No pneumothorax is noted. Right-sided PICC line is unchanged in position. Mild bilateral pleural effusions are noted, right greater than left. Probable bibasilar atelectasis is noted as well. Bony thorax is unremarkable. IMPRESSION: Mild bilateral pleural effusions, right greater than left. Probable bibasilar atelectasis. Electronically Signed   By: Lupita RaiderJames  Green Jr M.D.   On: 03/19/2020 13:12   CT Angio Chest PE W/Cm &/Or Wo Cm  Result Date: 03/29/2020 CLINICAL DATA:  Chest pain with shortness of breath. History of bowel obstruction with surgery times 2 weeks. Recent infection. Staples removed yesterday. EXAM: CT ANGIOGRAPHY CHEST CT ABDOMEN AND PELVIS WITH CONTRAST TECHNIQUE:  Multidetector CT imaging of the chest was performed using the standard protocol during bolus administration of intravenous contrast. Multiplanar CT image reconstructions and MIPs were obtained to evaluate the vascular anatomy. Multidetector CT imaging of the abdomen and pelvis  was performed using the standard protocol during bolus administration of intravenous contrast. CONTRAST:  OMNIPAQUE IOHEXOL 350 MG/ML SOLN COMPARISON:  CT dated 03/25/2020 FINDINGS: CTA CHEST FINDINGS Cardiovascular: Contrast injection is sufficient to demonstrate satisfactory opacification of the pulmonary arteries to the segmental level. There is no pulmonary embolus or evidence of right heart strain. The size of the main pulmonary artery is normal. There is significant cardiomegaly with biatrial enlargement. Mediastinum/Nodes: -- No mediastinal lymphadenopathy. -- No hilar lymphadenopathy. -- No axillary lymphadenopathy. -- No supraclavicular lymphadenopathy. -- Normal thyroid gland where visualized. -  Unremarkable esophagus. Lungs/Pleura: There are trace bilateral pleural effusions. There is atelectasis at the lung bases. There is mild interlobular septal thickening. There is no pneumothorax. The trachea is unremarkable. Musculoskeletal: No chest wall abnormality. No bony spinal canal stenosis. CT ABDOMEN and PELVIS FINDINGS Hepatobiliary: Low-attenuation nodules are again noted throughout the patient's left and right hepatic lobes as before. Status post cholecystectomy.There is mild intrahepatic biliary ductal dilatation, unchanged from prior study. Pancreas: Normal contours without ductal dilatation. No peripancreatic fluid collection. Spleen: Unremarkable. Adrenals/Urinary Tract: --Adrenal glands: Unremarkable. --Right kidney/ureter: No hydronephrosis or radiopaque kidney stones. --Left kidney/ureter: No hydronephrosis or radiopaque kidney stones. --Urinary bladder: Unremarkable. Stomach/Bowel: --Stomach/Duodenum: No hiatal  hernia or other gastric abnormality. Normal duodenal course and caliber. --Small bowel: There are mildly dilated hyperenhancing loops of small bowel in the left mid abdomen. There is a right lower quadrant ileostomy without evidence for obstruction. --Colon: Patient is status post subtotal colectomy. --Appendix: Surgically absent. Vascular/Lymphatic: Atherosclerotic calcification is present within the non-aneurysmal abdominal aorta, without hemodynamically significant stenosis. --No retroperitoneal lymphadenopathy. --No mesenteric lymphadenopathy. --No pelvic or inguinal lymphadenopathy. Reproductive: There is a probable fibroid uterus. Other: There is a fat containing lesion abutting the dome of the urinary bladder measuring approximately 2.7 cm. This is somewhat similar cross prior studies and may represent an area of fat necrosis. There is a midline abdominal wall incision that appears open. Inferiorly there is packing material. Superior to the umbilicus, there is a somewhat ill-defined 6.3 by 1.6 by 0.6 cm fluid collection deep to the midline incision. There is no rim enhancement of this collection. There is a trace amount of free fluid in the abdomen. There is no free air. Musculoskeletal. No acute displaced fractures. Review of the MIP images confirms the above findings. IMPRESSION: 1. No acute pulmonary embolism. 2. Trace bilateral pleural effusions with atelectasis. 3. Cardiomegaly with biatrial enlargement. There are findings of mild interstitial edema. 4. Mildly dilated hyperenhancing loops of small bowel in the left mid abdomen may represent ileus or enteritis. 5. Fluid collection deep to the midline incision superior to the umbilicus as detailed above. This is favored to represent a postoperative seroma or hematoma but has increased in size from prior study. A developing abscess is not excluded. 6. Trace amount of free fluid in the abdomen. 7. Fibroid uterus. 8. Fat containing lesion abutting the dome of  the urinary bladder favored to represent an area of fat necrosis. Aortic Atherosclerosis (ICD10-I70.0). Electronically Signed   By: Katherine Mantle M.D.   On: 03/29/2020 00:03   CT Abdomen Pelvis W Contrast  Result Date: 03/29/2020 CLINICAL DATA:  Chest pain with shortness of breath. History of bowel obstruction with surgery times 2 weeks. Recent infection. Staples removed yesterday. EXAM: CT ANGIOGRAPHY CHEST CT ABDOMEN AND PELVIS WITH CONTRAST TECHNIQUE: Multidetector CT imaging of the chest was performed using the standard protocol during bolus administration of intravenous contrast. Multiplanar CT image reconstructions and MIPs  were obtained to evaluate the vascular anatomy. Multidetector CT imaging of the abdomen and pelvis was performed using the standard protocol during bolus administration of intravenous contrast. CONTRAST:  OMNIPAQUE IOHEXOL 350 MG/ML SOLN COMPARISON:  CT dated 03/25/2020 FINDINGS: CTA CHEST FINDINGS Cardiovascular: Contrast injection is sufficient to demonstrate satisfactory opacification of the pulmonary arteries to the segmental level. There is no pulmonary embolus or evidence of right heart strain. The size of the main pulmonary artery is normal. There is significant cardiomegaly with biatrial enlargement. Mediastinum/Nodes: -- No mediastinal lymphadenopathy. -- No hilar lymphadenopathy. -- No axillary lymphadenopathy. -- No supraclavicular lymphadenopathy. -- Normal thyroid gland where visualized. -  Unremarkable esophagus. Lungs/Pleura: There are trace bilateral pleural effusions. There is atelectasis at the lung bases. There is mild interlobular septal thickening. There is no pneumothorax. The trachea is unremarkable. Musculoskeletal: No chest wall abnormality. No bony spinal canal stenosis. CT ABDOMEN and PELVIS FINDINGS Hepatobiliary: Low-attenuation nodules are again noted throughout the patient's left and right hepatic lobes as before. Status post  cholecystectomy.There is mild intrahepatic biliary ductal dilatation, unchanged from prior study. Pancreas: Normal contours without ductal dilatation. No peripancreatic fluid collection. Spleen: Unremarkable. Adrenals/Urinary Tract: --Adrenal glands: Unremarkable. --Right kidney/ureter: No hydronephrosis or radiopaque kidney stones. --Left kidney/ureter: No hydronephrosis or radiopaque kidney stones. --Urinary bladder: Unremarkable. Stomach/Bowel: --Stomach/Duodenum: No hiatal hernia or other gastric abnormality. Normal duodenal course and caliber. --Small bowel: There are mildly dilated hyperenhancing loops of small bowel in the left mid abdomen. There is a right lower quadrant ileostomy without evidence for obstruction. --Colon: Patient is status post subtotal colectomy. --Appendix: Surgically absent. Vascular/Lymphatic: Atherosclerotic calcification is present within the non-aneurysmal abdominal aorta, without hemodynamically significant stenosis. --No retroperitoneal lymphadenopathy. --No mesenteric lymphadenopathy. --No pelvic or inguinal lymphadenopathy. Reproductive: There is a probable fibroid uterus. Other: There is a fat containing lesion abutting the dome of the urinary bladder measuring approximately 2.7 cm. This is somewhat similar cross prior studies and may represent an area of fat necrosis. There is a midline abdominal wall incision that appears open. Inferiorly there is packing material. Superior to the umbilicus, there is a somewhat ill-defined 6.3 by 1.6 by 0.6 cm fluid collection deep to the midline incision. There is no rim enhancement of this collection. There is a trace amount of free fluid in the abdomen. There is no free air. Musculoskeletal. No acute displaced fractures. Review of the MIP images confirms the above findings. IMPRESSION: 1. No acute pulmonary embolism. 2. Trace bilateral pleural effusions with atelectasis. 3. Cardiomegaly with biatrial enlargement. There are findings of mild  interstitial edema. 4. Mildly dilated hyperenhancing loops of small bowel in the left mid abdomen may represent ileus or enteritis. 5. Fluid collection deep to the midline incision superior to the umbilicus as detailed above. This is favored to represent a postoperative seroma or hematoma but has increased in size from prior study. A developing abscess is not excluded. 6. Trace amount of free fluid in the abdomen. 7. Fibroid uterus. 8. Fat containing lesion abutting the dome of the urinary bladder favored to represent an area of fat necrosis. Aortic Atherosclerosis (ICD10-I70.0). Electronically Signed   By: Katherine Mantle M.D.   On: 03/29/2020 00:03   CT ABDOMEN PELVIS W CONTRAST  Result Date: 03/25/2020 CLINICAL DATA:  Postop 1 week subtotal colectomy. Purulence drainage from wound. Evaluate for abscess. colectomy for bowel obstruction. EXAM: CT ABDOMEN AND PELVIS WITH CONTRAST TECHNIQUE: Multidetector CT imaging of the abdomen and pelvis was performed using the standard protocol following bolus  administration of intravenous contrast. CONTRAST:  OMNIPAQUE IOHEXOL 300 MG/ML  SOLN COMPARISON:  CT 03/19/2020 FINDINGS: Lower chest: Improving bilateral pleural effusions. Moderate effusion remains on the RIGHT. Near complete resolution of LEFT effusion. There is passive atelectasis in the RIGHT lower lobe. Hepatobiliary: Several hypodense lesions in liver unchanged consistent with cysts. Postcholecystectomy. Pancreas: Pancreas is normal. No ductal dilatation. No pancreatic inflammation. Spleen: Normal spleen Adrenals/urinary tract: Adrenal glands and kidneys are normal. The ureters and bladder normal. Stomach/Bowel: Stomach is distended with oral contrast. There is mild dilatation of the proximal small bowel 3.2 cm similar comparison CT. Contrast flows in the mid small bowel but does not reach the ileostomy in the course of the study. There is no transition point. RIGHT abdominal wall ileostomy without  complicating features. Vascular/Lymphatic: Abdominal aorta is normal caliber with atherosclerotic calcification. There is no retroperitoneal or periportal lymphadenopathy. No pelvic lymphadenopathy. Reproductive: Uterus normal Other: Interval reduction in intraperitoneal free fluid seen on comparison exam. Small amount fluid remains in the RIGHT abdominal peritoneal space. Near complete resolution of fluid within the pelvis. Small subcutaneous fluid collection along the inferior margin of the surgical wound staples is increased in diameter compared to prior. This triangular collection measures 2.3 x 3.0 cm compared to 1.2 x 1.5 cm on comparison exam. This collection is superficial to the muscle layer does not appear to communicate with the peritoneal space. Musculoskeletal: No aggressive osseous lesion. IMPRESSION: 1. Increased organization of fluid collection along the inferior margin of the midline ventral wound. This collection does not appear to communicate with the peritoneal space. 2. Persistent distended stomach and dilated proximal small bowel with no obstructing lesion or transition point. Findings continue to suggest adynamic ileus. No evidence obstruction of the RIGHT lower quadrant ileostomy. 3. Interval decrease in intraperitoneal free fluid. 4. Interval decrease in pleural effusions. Electronically Signed   By: Genevive Bi M.D.   On: 03/25/2020 18:45   CT ABDOMEN PELVIS W CONTRAST  Result Date: 03/19/2020 CLINICAL DATA:  Worsening leukocytosis 1 week following subtotal colectomy. Abdominal abscess/infection suspected. EXAM: CT ABDOMEN AND PELVIS WITH CONTRAST TECHNIQUE: Multidetector CT imaging of the abdomen and pelvis was performed using the standard protocol following bolus administration of intravenous contrast. CONTRAST:  OMNIPAQUE IOHEXOL 300 MG/ML  SOLN COMPARISON:  Abdominopelvic CT 03/11/2020 and 12/21/2019. FINDINGS: Lower chest: Slight interval enlargement of moderate right  and small left pleural effusions with associated worsening atelectasis dependently in both lung bases. Stable cardiomegaly and coronary artery atherosclerosis. No significant pericardial fluid. Hepatobiliary: Stable appendix cysts. Stable mild extrahepatic biliary dilatation post cholecystectomy. Pancreas: Unremarkable. No pancreatic ductal dilatation or surrounding inflammatory changes. Spleen: Normal in size without focal abnormality. Adrenals/Urinary Tract: Both adrenal glands appear normal. Stable mild bilateral renal cortical thinning. No renal mass, urinary tract calculus or hydronephrosis. The bladder appears normal. Stomach/Bowel: Enteric contrast was administered and extends to the ileostomy in the right mid abdominal wall. There is mild diffuse dilatation of the stomach and small bowel without focal wall thickening or enteric contrast extravasation. Subtotal colectomy has been performed without evidence of suture breakdown at the [facility]. Vascular/Lymphatic: There are no enlarged abdominal or pelvic lymph nodes. Aortic and branch vessel atherosclerosis. The portal, superior mesenteric and splenic veins are patent. Reproductive: Enhancing mass anteriorly in the lower uterine segment measuring 4.4 x 3.1 cm on image 81/2, grossly stable from prior CTs and consistent with an anterior fibroid. No suspicious adnexal findings. Other: Small amount of pelvic and interloop ascites without suspicious  peritoneal enhancement or focal extraluminal fluid collection. No free intraperitoneal air. Musculoskeletal: No acute or significant osseous findings. IMPRESSION: 1. Interval subtotal colectomy and ileostomy. Mild diffuse dilatation of the stomach and small bowel without focal wall thickening or enteric contrast extravasation, suggesting adynamic ileus. 2. Small amount of pelvic and interloop ascites without suspicious peritoneal enhancement or focal extraluminal fluid collection. 3. Slight interval enlargement of  right greater than left pleural effusions with associated worsening atelectasis dependently in both lung bases. 4. Uterine fibroid. 5. Aortic Atherosclerosis (ICD10-I70.0). Electronically Signed   By: Carey Bullocks M.D.   On: 03/19/2020 12:20   CT ABDOMEN PELVIS W CONTRAST  Result Date: 03/11/2020 CLINICAL DATA:  74 year old female with abdominal distension. EXAM: CT ABDOMEN AND PELVIS WITH CONTRAST TECHNIQUE: Multidetector CT imaging of the abdomen and pelvis was performed using the standard protocol following bolus administration of intravenous contrast. CONTRAST:  OMNIPAQUE IOHEXOL 300 MG/ML  SOLN COMPARISON:  CT abdomen pelvis dated 12/21/2019. FINDINGS: Lower chest: Trace left and small right pleural effusions. Patchy right lung base consolidation, likely atelectasis. Pneumonia is not excluded clinical correlation is recommended. There is moderate cardiomegaly with biatrial dilatation. There is retrograde flow of contrast from the right atrium into the IVC suggestive of a degree of right heart dysfunction. There is calcification of the mitral annulus. No intra-abdominal free air. Trace free fluid in the pelvis. Hepatobiliary: There is mild irregularity of the liver contour suggestive of early changes of cirrhosis. Bilobed or 2 adjacent cysts in the left lobe of the liver with combined dimension of 2 cm. No intrahepatic biliary ductal dilatation. Cholecystectomy. No retained calcified stone noted in the central CBD. Pancreas: No acute findings. Spleen: Normal in size without focal abnormality. Adrenals/Urinary Tract: The adrenal glands unremarkable. Mild bilateral parenchyma atrophy. There is no hydronephrosis on either side. There is symmetric enhancement and excretion of contrast by both kidneys. The visualized ureters and urinary bladder appear unremarkable. Stomach/Bowel: There is sigmoid diverticulosis with muscular hypertrophy. There is moderate diffuse colonic distension with air extending to  the level of the sigmoid colon suggestive of a degree of obstruction of the sigmoid colon, likely related to muscular hypertrophy or stricture. Underlying mass is not excluded. Clinical correlation and follow-up recommended. Scattered tiny pockets of air along the wall of the cecum and proximal colon likely mixed with the colonic content and less likely pneumatosis. Correlation with lactic acid recommended to exclude bowel ischemia. There is a small hiatal hernia. No evidence of small-bowel obstruction. Vascular/Lymphatic: Mild aortoiliac atherosclerotic disease. The IVC is unremarkable. No portal venous gas. There is no adenopathy. Reproductive: The uterus is grossly unremarkable. Other: Small fat containing umbilical hernia. Musculoskeletal: No acute or significant osseous findings. IMPRESSION: 1. Moderate diffuse colonic distension likely related to a degree of obstruction of the sigmoid colon, secondary to muscular hypertrophy or stricture. Underlying mass is not excluded. Clinical correlation and follow-up recommended. 2. Sigmoid diverticulosis with muscular hypertrophy. No active inflammatory changes. 3. Moderate cardiomegaly with biatrial dilatation. 4. Trace left and small right pleural effusions. Patchy right lung base consolidation, likely atelectasis. Pneumonia is not excluded. 5. Aortic Atherosclerosis (ICD10-I70.0). Electronically Signed   By: Elgie Collard M.D.   On: 03/11/2020 19:45   DG Chest Port 1 View  Result Date: 04/09/2020 CLINICAL DATA:  Shortness of breath EXAM: PORTABLE CHEST 1 VIEW COMPARISON:  Chest x-ray dated 03/28/2020 FINDINGS: There is cardiomegaly with evidence for congestive heart failure. There are moderate to large bilateral pleural effusions, right greater than left.  There is no pneumothorax. Bibasilar airspace disease is noted and favored to represent atelectasis. IMPRESSION: 1. Cardiomegaly with evidence for congestive heart failure. 2. Moderate to large bilateral  pleural effusions, right greater than left. Electronically Signed   By: Katherine Mantle M.D.   On: 04/09/2020 00:27   DG Chest Port 1 View  Result Date: 03/28/2020 CLINICAL DATA:  Questionable sepsis, sharp 5/10 chest pain, shortness of breath history of bowel obstruction, now post subtotal colectomy EXAM: PORTABLE CHEST 1 VIEW COMPARISON:  Radiograph 03/19/2020 FINDINGS: There is diffuse hazy interstitial opacity in the lungs with a mid to lower lung predominance and some faint septal thickening as well as central vascular congestion. More hazy opacities present in the right infrahilar lung. Small bilateral effusions are present, likely diminished from comparison accounting for differences in technique. No pneumothorax. Cardiomegaly similar to prior counting for differences in technique. The osseous structures appear diffusely demineralized which may limit detection of small or nondisplaced fractures. No acute osseous abnormality or suspicious osseous lesion. Degenerative changes are present in the imaged spine and shoulders. Soft tissues unremarkable. Telemetry leads overlie the chest. IMPRESSION: 1. Appearance could suggest some mild CHF/volume overload with cardiomegaly and features of interstitial with trace bilateral effusions. 2. More hazy opacity in the right infrahilar lung is favored to reflect some developing alveolar edema though early airspace disease is not excluded in the setting of sepsis. Electronically Signed   By: Kreg Shropshire M.D.   On: 03/28/2020 22:16   DG Chest Port 1 View  Result Date: 03/13/2020 CLINICAL DATA:  Dyspnea EXAM: PORTABLE CHEST 1 VIEW COMPARISON:  03/14/2019 FINDINGS: Cardiac shadow remains enlarged. Aortic calcifications are again seen. Small effusions are noted bilaterally right slightly greater than left. Mild left basilar atelectasis is seen. Mild central vascular congestion is noted as well. IMPRESSION: Small effusions right greater than left. Mild left basilar  atelectasis is seen. Mild vascular congestion is noted as well. Electronically Signed   By: Alcide Clever M.D.   On: 03/13/2020 18:05   DG Abd 2 Views  Result Date: 03/11/2020 CLINICAL DATA:  Abdominal pain EXAM: ABDOMEN - 2 VIEW COMPARISON:  December 12, 2019 FINDINGS: There is marked gaseous dilation of loops colon. The cecum measures approximately 13 cm. No definitive free air. There are nonspecific air-fluid levels on upright radiographs. Small amount of air seen within the rectum. Bibasilar heterogeneous opacities, nonspecific. Surgical clips project over the upper abdomen. Degenerative changes of the lower lumbar spine. IMPRESSION: 1. Marked gaseous dilation of loops of colon. Small amount of air seen within the rectum. Findings are concerning for colonic obstruction versus ileus. Recommend further evaluation with dedicated contrast enhanced CT. 2.  Bibasilar heterogeneous opacities, nonspecific. Electronically Signed   By: Meda Klinefelter MD   On: 03/11/2020 15:37   DG BE (COLON)W SINGLE CM (SOL OR THIN BA)  Result Date: 03/12/2020 CLINICAL DATA:  Evaluate for colonic obstruction EXAM: SINGLE CONTRAST ENEMA TECHNIQUE: Initial scout AP supine abdominal image obtained. Contrast was introduced into the colon in a retrograde fashion and spot images were obtained. Dilute Omnipaque 300 contrast media was used. FLUOROSCOPY TIME:  Fluoroscopy Time:  1.2 minutes Radiation Exposure Index (if provided by the fluoroscopic device): 28.4 mGy Number of Acquired Spot Images: 12 COMPARISON:  CT 03/11/2020 FINDINGS: Single contrast enema was performed utilizing dilute Omnipaque 300. Contrast filled the rectum and distal sigmoid colon. There is abrupt luminal obstruction the level of the mid to distal sigmoid colon corresponding to site of high-grade luminal  narrowing seen on CT. No contrast was able to traverse this level. No extraluminal contrast was visualized. Patient tolerated the procedure well. IMPRESSION:  Complete colonic obstruction at the level of the mid to distal sigmoid colon. These results were called by telephone at the conclusion of the exam on 03/12/2020 at 10:53 a.m. to provider Lynden Oxford, PA, who verbally acknowledged these results. Electronically Signed   By: Duanne Guess D.O.   On: 03/12/2020 11:39   [place] EKG SITE RITE  Result Date: 03/13/2020 If Site Rite image not attached, placement could not be confirmed due to current cardiac rhythm.   EKG: atrial fibrillation, rate 103, no evidence of ischemia  ASSESSMENT AND PLAN:   1. 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 Chest xray shows cardiomegaly with evidence of CHF with moderate to large bilateral pleural effusion, right greater than left. Troponin normal. BNP 1,381. 2. Paroxysmal atrial fibrillation, on Eliquis for stroke prevention, and Cardizem CD and metoprolol succinate for rate control. Heart rate in the 110s this morning. On amiodarone at home. 3. Essential hypertension 4. Mitral regurgitation, 2D echocardiogram on 12/06/2019 revealed normal left ventricular function with LVEF 55 to 60% with moderate mitral valve prolapse, mild mitral regurgitation with mildly elevated pulmonary artery systolic pressure.  5. History of stroke 6. Pulmonary hypertension 7. Hypothyroidism, TSH 7.474 (up from 1.828 three weeks ago).  Her dose of levothyroxine was increased.   Recommendations: 1. Continue Eliquis 5 mg BID for stroke prevention 2. Continue IV Lasix 40 mg BID with careful monitoring of renal status, daily weight, I&Os, and blood pressure 3. Continue metoprolol succinate 50 mg BID and Cardizem CD 120 mg for rate control. Defer increasing doses at this time due to hypotension 4. Recommend  discontinuing aspirin to reduce bleeding risk with history of anemia 5. Recommend resuming amiodarone 200 mg daily; adjust levothyroxine as needed   Signed: Leanora Ivanoff PA-C 04/09/2020, 8:26 AM    Discussed with Dr. Darrold Junker who agrees with the above plan.

## 2020-04-09 NOTE — Progress Notes (Signed)
PROGRESS NOTE    [name]  SNK:[id number] DOB: 24-Sep-1945 DOA: 04/08/2020 PCP: Mickel Fuchs, MD   Assessment & Plan:   Active Problems:   Acute CHF (congestive heart failure) (HCC)   Possible acute on chronic diastolic CHF: continue on IV lasix. Strict I/Os and daily weights. Fluid restriction.   PAF: continue on amiodarone, cardizem, metoprolol & eliquis   HTN: continue on metoprolol, cardizem & amiodarone   Hypothyroidism: continue on synthroid   GERD: continue on PPI    DVT prophylaxis: eliquis  Code Status: full Family Communication: discussed pt's care w/ pt's son, [name], and answered his questions Disposition Plan: likely d/c back to SNF  Status is: Inpatient  Remains inpatient appropriate because:Ongoing diagnostic testing needed not appropriate for outpatient work up and IV treatments appropriate due to intensity of illness or inability to take PO   Dispo: The patient is from: SNF              Anticipated d/c is to: SNF              Anticipated d/c date is: 3 days              Patient currently is not medically stable to d/c.         Consultants:   Cardio    Procedures:   Antimicrobials:    Subjective: Pt c/o shortness of breath   Objective: Vitals:   04/09/20 0130 04/09/20 0230 04/09/20 0237 04/09/20 0320  BP: 111/87 106/78  105/76  Pulse: (!) 108 (!) 101  (!) 103  Resp: 17 (!) 24  20  Temp:   (!) 97.4 F (36.3 C) 98.3 F (36.8 C)  TempSrc:   Oral Oral  SpO2: 97% 98%  100%  Weight:    67.4 kg  Height:    5\' 5"  (1.651 m)    Intake/Output Summary (Last 24 hours) at 04/09/2020 0733 Last data filed at 04/09/2020 0636 Gross per 24 hour  Intake --  Output 900 ml  Net -900 ml   Filed Weights   04/09/20 0320  Weight: 67.4 kg    Examination:  General exam: Appears calm and comfortable  Respiratory system: diminished breath sounds b/l  Cardiovascular system: irregularly irregular. No  rubs, gallops or clicks.  . Gastrointestinal system: Abdomen is nondistended, soft and nontender. Ostomy bag noted. Hypoactive bowel sounds heard. Central nervous system: Alert and oriented. Moves all 4 extremities  Psychiatry: Judgement and insight appear abnormal. Mood & affect appropriate.     Data Reviewed: I have personally reviewed following labs and imaging studies  CBC: Recent Labs  Lab 04/08/20 2350 04/09/20 0419  WBC 14.1* 13.2*  NEUTROABS 11.5* 11.0*  HGB 10.8* 11.1*  HCT 33.6* 34.6*  MCV 82.2 82.4  PLT 313 311   Basic Metabolic Panel: Recent Labs  Lab 04/08/20 2350 04/09/20 0419  NA 129*  --   K 4.8  --   CL 97*  --   CO2 22  --   GLUCOSE 117*  --   BUN 11  --   CREATININE 0.81  --   CALCIUM 8.3*  --   MG  --  2.0   GFR: Estimated Creatinine Clearance: 53.2 mL/min (by C-G formula based on SCr of 0.81 mg/dL). Liver Function Tests: Recent Labs  Lab 04/08/20 2350  AST 26  ALT 34  ALKPHOS 107  BILITOT 0.8  PROT 6.3*  ALBUMIN 2.7*   No results for input(s): LIPASE, AMYLASE in the  last 168 hours. No results for input(s): AMMONIA in the last 168 hours. Coagulation Profile: No results for input(s): INR, PROTIME in the last 168 hours. Cardiac Enzymes: No results for input(s): CKTOTAL, CKMB, CKMBINDEX, TROPONINI in the last 168 hours. BNP (last 3 results) No results for input(s): PROBNP in the last 8760 hours. HbA1C: No results for input(s): HGBA1C in the last 72 hours. CBG: No results for input(s): GLUCAP in the last 168 hours. Lipid Profile: No results for input(s): CHOL, HDL, LDLCALC, TRIG, CHOLHDL, LDLDIRECT in the last 72 hours. Thyroid Function Tests: Recent Labs    04/09/20 0419  TSH 7.474*   Anemia Panel: No results for input(s): VITAMINB12, FOLATE, FERRITIN, TIBC, IRON, RETICCTPCT in the last 72 hours. Sepsis Labs: No results for input(s): PROCALCITON, LATICACIDVEN in the last 168 hours.  Recent Results (from the past 240 hour(s))  Respiratory Panel by RT  PCR (Flu A&B, Covid) - Nasopharyngeal Swab     Status: None   Collection Time: 04/02/20  1:29 PM   Specimen: Nasopharyngeal Swab  Result Value Ref Range Status   SARS Coronavirus 2 by RT PCR NEGATIVE NEGATIVE Final    Comment: (NOTE) SARS-CoV-2 target nucleic acids are NOT DETECTED.  The SARS-CoV-2 RNA is generally detectable in upper respiratoy specimens during the acute phase of infection. The lowest concentration of SARS-CoV-2 viral copies this assay can detect is 131 copies/mL. A negative result does not preclude SARS-Cov-2 infection and should not be used as the sole basis for treatment or other patient management decisions. A negative result may occur with  improper specimen collection/handling, submission of specimen other than nasopharyngeal swab, presence of viral mutation(s) within the areas targeted by this assay, and inadequate number of viral copies (<131 copies/mL). A negative result must be combined with clinical observations, patient history, and epidemiological information. The expected result is Negative.  Fact Sheet for Patients:  https://www.moore.com/  Fact Sheet for Healthcare Providers:  https://www.young.biz/  This test is no t yet approved or cleared by the [place] FDA and  has been authorized for detection and/or diagnosis of SARS-CoV-2 by FDA under an Emergency Use Authorization (EUA). This EUA will remain  in effect (meaning this test can be used) for the duration of the COVID-19 declaration under Section 564(b)(1) of the Act, 21 U.S.C. section 360bbb-3(b)(1), unless the authorization is terminated or revoked sooner.     Influenza A by PCR NEGATIVE NEGATIVE Final   Influenza B by PCR NEGATIVE NEGATIVE Final    Comment: (NOTE) The Xpert Xpress SARS-CoV-2/FLU/RSV assay is intended as an aid in  the diagnosis of influenza from Nasopharyngeal swab specimens and  should not be used as a sole basis for  treatment. Nasal washings and  aspirates are unacceptable for Xpert Xpress SARS-CoV-2/FLU/RSV  testing.  Fact Sheet for Patients: https://www.moore.com/  Fact Sheet for Healthcare Providers: https://www.young.biz/  This test is not yet approved or cleared by the [place] FDA and  has been authorized for detection and/or diagnosis of SARS-CoV-2 by  FDA under an Emergency Use Authorization (EUA). This EUA will remain  in effect (meaning this test can be used) for the duration of the  Covid-19 declaration under Section 564(b)(1) of the Act, 21  U.S.C. section 360bbb-3(b)(1), unless the authorization is  terminated or revoked. Performed at [facility], [address] [place], [place] [address]   Resp Panel by RT PCR (RSV, Flu A&B, Covid) - Nasopharyngeal Swab     Status: None   Collection Time: 04/09/20  1:02 AM   Specimen: Nasopharyngeal Swab  Result Value Ref Range Status   SARS Coronavirus 2 by RT PCR NEGATIVE NEGATIVE Final    Comment: (NOTE) SARS-CoV-2 target nucleic acids are NOT DETECTED.  The SARS-CoV-2 RNA is generally detectable in upper respiratoy specimens during the acute phase of infection. The lowest concentration of SARS-CoV-2 viral copies this assay can detect is 131 copies/mL. A negative result does not preclude SARS-Cov-2 infection and should not be used as the sole basis for treatment or other patient management decisions. A negative result may occur with  improper specimen collection/handling, submission of specimen other than nasopharyngeal swab, presence of viral mutation(s) within the areas targeted by this assay, and inadequate number of viral copies (<131 copies/mL). A negative result must be combined with clinical observations, patient history, and epidemiological information. The expected result is Negative.  Fact Sheet for Patients:  https://www.moore.com/  Fact Sheet for  Healthcare Providers:  https://www.young.biz/  This test is no t yet approved or cleared by the [place] FDA and  has been authorized for detection and/or diagnosis of SARS-CoV-2 by FDA under an Emergency Use Authorization (EUA). This EUA will remain  in effect (meaning this test can be used) for the duration of the COVID-19 declaration under Section 564(b)(1) of the Act, 21 U.S.C. section 360bbb-3(b)(1), unless the authorization is terminated or revoked sooner.     Influenza A by PCR NEGATIVE NEGATIVE Final   Influenza B by PCR NEGATIVE NEGATIVE Final    Comment: (NOTE) The Xpert Xpress SARS-CoV-2/FLU/RSV assay is intended as an aid in  the diagnosis of influenza from Nasopharyngeal swab specimens and  should not be used as a sole basis for treatment. Nasal washings and  aspirates are unacceptable for Xpert Xpress SARS-CoV-2/FLU/RSV  testing.  Fact Sheet for Patients: https://www.moore.com/  Fact Sheet for Healthcare Providers: https://www.young.biz/  This test is not yet approved or cleared by the [place] FDA and  has been authorized for detection and/or diagnosis of SARS-CoV-2 by  FDA under an Emergency Use Authorization (EUA). This EUA will remain  in effect (meaning this test can be used) for the duration of the  Covid-19 declaration under Section 564(b)(1) of the Act, 21  U.S.C. section 360bbb-3(b)(1), unless the authorization is  terminated or revoked.    Respiratory Syncytial Virus by PCR NEGATIVE NEGATIVE Final    Comment: (NOTE) Fact Sheet for Patients: https://www.moore.com/  Fact Sheet for Healthcare Providers: https://www.young.biz/  This test is not yet approved or cleared by the [place] FDA and  has been authorized for detection and/or diagnosis of SARS-CoV-2 by  FDA under an Emergency Use Authorization (EUA). This EUA will remain  in effect  (meaning this test can be used) for the duration of the  COVID-19 declaration under Section 564(b)(1) of the Act, 21 U.S.C.  section 360bbb-3(b)(1), unless the authorization is terminated or  revoked. Performed at [facility], [address] [place], [place] [address]          Radiology Studies: DG Chest [place] 1 View  Result Date: 04/09/2020 CLINICAL DATA:  Shortness of breath EXAM: PORTABLE CHEST 1 VIEW COMPARISON:  Chest x-ray dated 03/28/2020 FINDINGS: There is cardiomegaly with evidence for congestive heart failure. There are moderate to large bilateral pleural effusions, right greater than left. There is no pneumothorax. Bibasilar airspace disease is noted and favored to represent atelectasis. IMPRESSION: 1. Cardiomegaly with evidence for congestive heart failure. 2. Moderate to large bilateral pleural effusions, right greater than left. Electronically  Signed   By: Katherine Mantlehristopher  Green M.D.   On: 04/09/2020 00:27        Scheduled Meds: . apixaban  5 mg Oral BID  . aspirin EC  81 mg Oral Daily  . diltiazem  120 mg Oral Daily  . enoxaparin (LOVENOX) injection  40 mg Subcutaneous Q24H  . folic acid  1 mg Oral Daily  . furosemide  40 mg Intravenous BID  . hydrocerin  1 application Topical BID  . levothyroxine  75 mcg Oral Q0600  . metoprolol succinate  50 mg Oral BID  . midodrine  2.5 mg Oral TID WC  . multivitamin with minerals  1 tablet Oral Daily  . pantoprazole  40 mg Oral Daily  . potassium chloride  10 mEq Oral Daily  . Ensure Max Protein  11 oz Oral BID  . sodium chloride flush  3 mL Intravenous Q12H   Continuous Infusions: . sodium chloride       LOS: 0 days    Time spent: 34 mins     Charise KillianJamiese M Zarea Diesing, MD [facility] Pager [phone]  If 7PM-7AM, please contact night-coverage 04/09/2020, 7:33 AM

## 2020-04-10 ENCOUNTER — Encounter: Payer: Self-pay | Admitting: Physician Assistant

## 2020-04-10 DIAGNOSIS — I10 Essential (primary) hypertension: Secondary | ICD-10-CM | POA: Diagnosis not present

## 2020-04-10 DIAGNOSIS — I5031 Acute diastolic (congestive) heart failure: Secondary | ICD-10-CM

## 2020-04-10 DIAGNOSIS — I480 Paroxysmal atrial fibrillation: Secondary | ICD-10-CM | POA: Diagnosis not present

## 2020-04-10 LAB — BASIC METABOLIC PANEL
Anion gap: 8 (ref 5–15)
BUN: 12 mg/dL (ref 8–23)
CO2: 27 mmol/L (ref 22–32)
Calcium: 8.4 mg/dL — ABNORMAL LOW (ref 8.9–10.3)
Chloride: 97 mmol/L — ABNORMAL LOW (ref 98–111)
Creatinine, Ser: 0.86 mg/dL (ref 0.44–1.00)
GFR, Estimated: >60 mL/min (ref >60)
Glucose, Bld: 102 mg/dL — ABNORMAL HIGH (ref 70–99)
Potassium: 3.7 mmol/L (ref 3.5–5.1)
Sodium: 132 mmol/L — ABNORMAL LOW (ref 135–145)

## 2020-04-10 LAB — CBC
HCT: 32.7 % — ABNORMAL LOW (ref 36.0–46.0)
Hemoglobin: 10.2 g/dL — ABNORMAL LOW (ref 12.0–15.0)
MCH: 26 pg (ref 26.0–34.0)
MCHC: 31.2 g/dL (ref 30.0–36.0)
MCV: 83.2 fL (ref 80.0–100.0)
Platelets: 271 10*3/uL (ref 150–400)
RBC: 3.93 MIL/uL (ref 3.87–5.11)
RDW: 16.5 % — ABNORMAL HIGH (ref 11.5–15.5)
WBC: 14 10*3/uL — ABNORMAL HIGH (ref 4.0–10.5)
nRBC: 0 % (ref 0.0–0.2)

## 2020-04-10 MED ORDER — PHENOL 1.4 % MT LIQD
1.0000 | OROMUCOSAL | Status: DC | PRN
  Administered 2020-04-10 (×2): 1 via OROMUCOSAL
  Filled 2020-04-10: qty 177

## 2020-04-10 NOTE — Progress Notes (Signed)
[facility] Cardiology    SUBJECTIVE: Patient reports feeling better this morning, breathing better, no chest pain or palpitations.    Vitals:   04/09/20 1924 04/10/20 0413 04/10/20 0719 04/10/20 1111  BP: 103/68 103/63 99/81 92/70   Pulse: 91 82 90 78  Resp: 19 19 16 16   Temp: 98.3 F (36.8 C) (!) 97.5 F (36.4 C) 98.1 F (36.7 C) 98.2 F (36.8 C)  TempSrc: Oral Oral Oral   SpO2: 99% 98% 99% 98%  Weight:  67.9 kg    Height:         Intake/Output Summary (Last 24 hours) at 04/10/2020 1128 Last data filed at 04/10/2020 0950 Gross per 24 hour  Intake 960 ml  Output 3200 ml  Net -2240 ml      PHYSICAL EXAM  General: Well developed, well nourished, sitting up in bed eating breakfast, in no acute distress HEENT:  Normocephalic and atramatic Neck:  No JVD.  Lungs: normal effort of breathing on supplemental oxygen, diminished breath sounds with crackles Heart: irregularly irregular, with 2/6 systolic murmur  Abdomen: nondistended Msk:  Gait not assessed, no obvious deformities Extremities: No clubbing, cyanosis or edema.   Neuro: Alert and oriented X 3. Psych:  Good affect, responds appropriately   LABS: Basic Metabolic Panel: Recent Labs    04/08/20 2350 04/09/20 0419 04/10/20 0424  NA 129*  --  132*  K 4.8  --  3.7  CL 97*  --  97*  CO2 22  --  27  GLUCOSE 117*  --  102*  BUN 11  --  12  CREATININE 0.81  --  0.86  CALCIUM 8.3*  --  8.4*  MG  --  2.0  --    Liver Function Tests: Recent Labs    04/08/20 2350  AST 26  ALT 34  ALKPHOS 107  BILITOT 0.8  PROT 6.3*  ALBUMIN 2.7*   No results for input(s): LIPASE, AMYLASE in the last 72 hours. CBC: Recent Labs    04/08/20 2350 04/08/20 2350 04/09/20 0419 04/10/20 0424  WBC 14.1*   < > 13.2* 14.0*  NEUTROABS 11.5*  --  11.0*  --   HGB 10.8*   < > 11.1* 10.2*  HCT 33.6*   < > 34.6* 32.7*  MCV 82.2   < > 82.4 83.2  PLT 313   < > 311 271   < > = values in this interval not displayed.   Cardiac  Enzymes: No results for input(s): CKTOTAL, CKMB, CKMBINDEX, TROPONINI in the last 72 hours. BNP: Invalid input(s): POCBNP D-Dimer: No results for input(s): DDIMER in the last 72 hours. Hemoglobin A1C: No results for input(s): HGBA1C in the last 72 hours. Fasting Lipid Panel: No results for input(s): CHOL, HDL, LDLCALC, TRIG, CHOLHDL, LDLDIRECT in the last 72 hours. Thyroid Function Tests: Recent Labs    04/09/20 0419  TSH 7.474*   Anemia Panel: No results for input(s): VITAMINB12, FOLATE, FERRITIN, TIBC, IRON, RETICCTPCT in the last 72 hours.  DG Chest Port 1 View  Result Date: 04/09/2020 CLINICAL DATA:  Shortness of breath EXAM: PORTABLE CHEST 1 VIEW COMPARISON:  Chest x-ray dated 03/28/2020 FINDINGS: There is cardiomegaly with evidence for congestive heart failure. There are moderate to large bilateral pleural effusions, right greater than left. There is no pneumothorax. Bibasilar airspace disease is noted and favored to represent atelectasis. IMPRESSION: 1. Cardiomegaly with evidence for congestive heart failure. 2. Moderate to large bilateral pleural effusions, right greater than left. Electronically Signed  By: Katherine Mantle M.D.   On: 04/09/2020 00:27     Echo LVEF 55-60% with mild MR  TELEMETRY: atrial fibrillation, rates low 100s  ASSESSMENT AND PLAN:  Active Problems:   Acute CHF (congestive heart failure) (HCC)    1. 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 Chest xray shows cardiomegaly with evidence of CHF with moderate to large bilateral pleural effusion, right greater than left. Troponin normal x 2. BNP 1,381. 2. Paroxysmal atrial fibrillation, on Eliquis for stroke prevention, and Cardizem CD and metoprolol succinate for rate  control, and amiodarone 200 mg daily for rhythm control. Heart rate in the 100-110s this morning.  3. Essential hypertension, low this morning 4. Mitral regurgitation, 2D echocardiogram on 12/06/2019 revealed normal left ventricular function with LVEF 55 to 60% with moderate mitral valve prolapse, mild mitral regurgitation with mildly elevated pulmonary artery systolic pressure.  5. History of stroke 6. Pulmonary hypertension 7. Hypothyroidism, TSH 7.474 (up from 1.828 three weeks ago).  Her dose of levothyroxine was increased.   Recommendations: 1. Continue Eliquis for stroke prevention 2. Continue amiodarone 200 mg daily for rhythm control and  3. Continue metoprolol succinate 50 mg and Cardizem CD 120 mg for rate control. Reduce dose of metoprolol if needed for blood pressure. 4. Recommend repeating chest xray tomorrow to monitor pleural effusions 5. Continue IV Lasix 40 mg BID with careful monitoring of daily weight, I&Os and renal status    Leanora Ivanoff, PA-C 04/10/2020 11:28 AM

## 2020-04-10 NOTE — NC FL2 (Signed)
[place] MEDICAID FL2 LEVEL OF CARE SCREENING TOOL     IDENTIFICATION  Patient Name: [name] Birthdate: 1946-03-11 Sex: female Admission Date (Current Location): 04/08/2020  [facility] and [place] Number:  Chiropodist and Address:  [facility], [address], [place], [place] [address]      Provider Number: [id number]  Attending Physician Name and Address:  Charise Killian, MD  Relative Name and Phone Number:       Current Level of Care: Hospital Recommended Level of Care: Skilled Nursing Facility Prior Approval Number:    Date Approved/Denied:   PASRR Number:    Discharge Plan: SNF    Current Diagnoses: Patient Active Problem List   Diagnosis Date Noted  . Acute CHF (congestive heart failure) (HCC) 04/09/2020  . Nocturnal hypoxia   . Atrial fibrillation, chronic (HCC)   . Weakness   . Severe sepsis (HCC) 03/29/2020  .  Possible Abscess of postoperative wound of abdominal wall 03/29/2020  . S/P laparotomy 03/12/20  03/29/2020  . History of CVA (cerebrovascular accident) 03/29/2020  . Hypotension   . Malnutrition of moderate degree 03/19/2020  . Hyponatremia 03/12/2020  . Chronic diastolic heart failure (HCC) 03/12/2020  . Large bowel obstruction (HCC) 03/12/2020  . Abdominal distention 03/11/2020  . Iron deficiency anemia 12/20/2019  . Acute on chronic diastolic CHF (congestive heart failure) (HCC) 12/04/2019  . Atrial fibrillation with RVR (HCC)   . Hypertension   . Stroke (HCC)   . Hypothyroidism   . Acute respiratory failure with hypoxia (HCC)   . Acute congestive heart failure (HCC) 03/17/2019  . Acute gastroenteritis 07/05/2017  . Acute respiratory failure (HCC) 03/27/2011    Orientation RESPIRATION BLADDER Height & Weight     Self, Time, Situation, Place  O2 ([place] 2L) Continent Weight: 149 lb 9.6 oz (67.9 kg) Height:  5\' 5"  (165.1 cm)  BEHAVIORAL SYMPTOMS/MOOD NEUROLOGICAL BOWEL NUTRITION STATUS       Incontinent, Colostomy Diet (heart healthy, thin liquids)  AMBULATORY STATUS COMMUNICATION OF NEEDS Skin   Limited Assist Verbally Surgical wounds (Closed incision on abdomen)                       Personal Care Assistance Level of Assistance  Dressing, Feeding, Bathing Bathing Assistance: Limited assistance Feeding assistance: Independent Dressing Assistance: Limited assistance     Functional Limitations Info  Sight, Hearing, Speech Sight Info: Adequate Hearing Info: Adequate Speech Info: Adequate    SPECIAL CARE FACTORS FREQUENCY  PT (By licensed PT), OT (By licensed OT)     PT Frequency: 5x OT Frequency: 5x            Contractures Contractures Info: Not present    Additional Factors Info  Code Status, Allergies Code Status Info: Full COde Allergies Info: Prednisone, Predicort (Prednisolone Acetate)           Current Medications (04/10/2020):  This is the current hospital active medication list Current Facility-Administered Medications  Medication Dose Route Frequency Provider Last Rate Last Admin  . 0.9 %  sodium chloride infusion  250 mL Intravenous PRN Mansy, Jan A, MD      . acetaminophen (TYLENOL) tablet 650 mg  650 mg Oral Q4H PRN Mansy, Jan A, MD   650 mg at 04/10/20 0514  . ALPRAZolam 13/09/21) tablet 0.25 mg  0.25 mg Oral BID PRN Mansy, Jan A, MD      . amiodarone (PACERONE) tablet 200 mg  200 mg Oral  Daily Leanora Ivanoff, PA-C   200 mg at 04/10/20 7939  . apixaban (ELIQUIS) tablet 5 mg  5 mg Oral BID Mansy, Jan A, MD   5 mg at 04/10/20 0916  . aspirin EC tablet 81 mg  81 mg Oral Daily Mansy, Jan A, MD   81 mg at 04/10/20 0916  . diltiazem (CARDIZEM CD) 24 hr capsule 120 mg  120 mg Oral Daily Mansy, Jan A, MD   120 mg at 04/10/20 0916  . folic acid (FOLVITE) tablet 1 mg  1 mg Oral Daily Mansy, Jan A, MD   1 mg at 04/10/20 0916  . furosemide (LASIX) injection 40 mg  40 mg Intravenous BID Mansy, Jan A, MD   40 mg at 04/10/20 0917  . hydrocerin (EUCERIN)  cream 1 application  1 application Topical BID Mansy, Jan A, MD   1 application at 04/10/20 0915  . hydrocortisone (ANUSOL-HC) 2.5 % rectal cream 1 application  1 application Topical BID PRN Mansy, Jan A, MD      . levothyroxine (SYNTHROID) tablet 75 mcg  75 mcg Oral Q0600 Mansy, Jan A, MD   75 mcg at 04/10/20 0514  . metoprolol succinate (TOPROL-XL) 24 hr tablet 50 mg  50 mg Oral BID Mansy, Jan A, MD   50 mg at 04/10/20 0916  . midodrine (PROAMATINE) tablet 2.5 mg  2.5 mg Oral TID WC Mansy, Jan A, MD   2.5 mg at 04/10/20 0917  . multivitamin with minerals tablet 1 tablet  1 tablet Oral Daily Mansy, Jan A, MD   1 tablet at 04/10/20 0916  . ondansetron (ZOFRAN) injection 4 mg  4 mg Intravenous Q6H PRN Mansy, Jan A, MD      . pantoprazole (PROTONIX) EC tablet 40 mg  40 mg Oral Daily Mansy, Jan A, MD   40 mg at 04/10/20 0915  . phenol (CHLORASEPTIC) mouth spray 1 spray  1 spray Mouth/Throat PRN Manuela Schwartz, NP   1 spray at 04/10/20 0915  . potassium chloride SA (KLOR-CON) CR tablet 10 mEq  10 mEq Oral Daily Mansy, Jan A, MD   10 mEq at 04/10/20 0916  . protein supplement (ENSURE MAX) liquid  11 oz Oral BID Mansy, Jan A, MD      . sodium chloride flush (NS) 0.9 % injection 3 mL  3 mL Intravenous Q12H Mansy, Jan A, MD   3 mL at 04/10/20 0917  . sodium chloride flush (NS) 0.9 % injection 3 mL  3 mL Intravenous PRN Mansy, Jan A, MD      . zolpidem (AMBIEN) tablet 5 mg  5 mg Oral QHS PRN Mansy, Vernetta Honey, MD         Discharge Medications: Please see discharge summary for a list of discharge medications.  Relevant Imaging Results:  Relevant Lab Results:   Additional Information SSN:[id number]  Maree Krabbe, LCSW

## 2020-04-10 NOTE — TOC Initial Note (Signed)
Transition of Care [facility]) - Initial/Assessment Note    Patient Details  Name: [name] MRN: [id number] Date of Birth: 14-Mar-1946  [facility] CM/SW Contact:    Maree Krabbe, LCSW Phone Number: 04/10/2020, 10:43 AM  Clinical Narrative:  Pt is alert and oriented. Pt states she was at [facility] prior to admission and had only been there a week or two. Pt states she has a apartment at [employer] is church affiliated housing. Pt sates her Son grocery shops for her. Pt sates she uses her son and [facility] for transport to her medical appointments. Pt has a established PCP on file--confirmed with pt. Pt states she uses [facility] for medication delivery. Pt states she wants to return to [facility] at d/c. CSW will send referral.   Pt states she has a scale at home and fills out a sheet each morning of her weight and blood pressure. Pt is aware to contact MD if there is weight gain.               Expected Discharge Plan: Skilled Nursing Facility Barriers to Discharge: Continued Medical Work up   Patient Goals and CMS Choice Patient states their goals for this hospitalization and ongoing recovery are:: to get better   Choice offered to / list presented to : Patient  Expected Discharge Plan and Services Expected Discharge Plan: Skilled Nursing Facility In-house Referral: Clinical Social Work   Post Acute Care Choice: Skilled Nursing Facility Living arrangements for the past 2 months: Skilled Holiday representative, Apartment                                      Prior Living Arrangements/Services Living arrangements for the past 2 months: Skilled Nursing Facility, Apartment Lives with:: Self Patient language and need for interpreter reviewed:: Yes Do you feel safe going back to the place where you live?: Yes      Need for Family Participation in Patient Care: Yes (Comment) Care giver support system in place?: Yes (comment)   Criminal Activity/Legal  Involvement Pertinent to Current Situation/Hospitalization: No - Comment as needed  Activities of Daily Living Home Assistive Devices/Equipment: Environmental consultant (specify type) ADL Screening (condition at time of admission) Patient's cognitive ability adequate to safely complete daily activities?: Yes Is the patient deaf or have difficulty hearing?: No Does the patient have difficulty seeing, even when wearing glasses/contacts?: No Does the patient have difficulty concentrating, remembering, or making decisions?: No Patient able to express need for assistance with ADLs?: No Does the patient have difficulty dressing or bathing?: No Independently performs ADLs?: Yes (appropriate for developmental age) Does the patient have difficulty walking or climbing stairs?: No Weakness of Legs: None Weakness of Arms/Hands: None  Permission Sought/Granted Permission sought to share information with : Family Supports Permission granted to share information with : Yes, Verbal Permission Granted  Share Information with NAME: [name]  Permission granted to share info w AGENCY: Licensed conveyancer granted to share info w Relationship: Son, [place]     Emotional Assessment Appearance:: Appears stated age Attitude/Demeanor/Rapport: Engaged Affect (typically observed): Accepting, Appropriate Orientation: : Oriented to Situation, Oriented to  Time, Oriented to Place, Oriented to Self Alcohol / Substance Use: Not Applicable Psych Involvement: No (comment)  Admission diagnosis:  SOB (shortness of breath) [R06.02] Acute CHF (congestive heart failure) (HCC) [I50.9] Acute respiratory failure with hypoxia (HCC) [J96.01] Acute on  chronic congestive heart failure, unspecified heart failure type [facility]) [I50.9] Patient Active Problem List   Diagnosis Date Noted  . Acute CHF (congestive heart failure) (HCC) 04/09/2020  . Nocturnal hypoxia   . Atrial fibrillation, chronic (HCC)   . Weakness   . Severe sepsis (HCC)  03/29/2020  .  Possible Abscess of postoperative wound of abdominal wall 03/29/2020  . S/P laparotomy 03/12/20  03/29/2020  . History of CVA (cerebrovascular accident) 03/29/2020  . Hypotension   . Malnutrition of moderate degree 03/19/2020  . Hyponatremia 03/12/2020  . Chronic diastolic heart failure (HCC) 03/12/2020  . Large bowel obstruction (HCC) 03/12/2020  . Abdominal distention 03/11/2020  . Iron deficiency anemia 12/20/2019  . Acute on chronic diastolic CHF (congestive heart failure) (HCC) 12/04/2019  . Atrial fibrillation with RVR (HCC)   . Hypertension   . Stroke (HCC)   . Hypothyroidism   . Acute respiratory failure with hypoxia (HCC)   . Acute congestive heart failure (HCC) 03/17/2019  . Acute gastroenteritis 07/05/2017  . Acute respiratory failure (HCC) 03/27/2011   PCP:  Mickel Fuchs, MD Pharmacy:   [facility] [place] [place] - [address] [place] [address] Phone: [phone] Fax: [phone]     Social Determinants of Health (SDOH) Interventions    Readmission Risk Interventions Readmission Risk Prevention Plan 04/10/2020 03/30/2020  Transportation Screening Complete Complete  PCP or Specialist Appt within 3-5 Days - Complete  HRI or Home Care Consult - Complete  Social Work Consult for Recovery Care Planning/Counseling - Complete  Palliative Care Screening - Not Applicable  Medication Review Oceanographer) Complete Complete  PCP or Specialist appointment within 3-5 days of discharge Complete -  HRI or Home Care Consult Complete -  SW Recovery Care/Counseling Consult Complete -  Palliative Care Screening Not Applicable -  Skilled Nursing Facility Complete -  Some recent data might be hidden

## 2020-04-10 NOTE — Progress Notes (Signed)
PROGRESS NOTE    [name]  MIW:[id number] DOB: [birth year] DOA: 04/08/2020 PCP: Mickel Fuchs, MD   Assessment & Plan:   Active Problems:   Acute CHF (congestive heart failure) (HCC)   Possible acute on chronic diastolic CHF: continue on IV lasix. Strict I/Os and daily weights. Fluid restriction. Repeat CXR in AM   PAF: continue on amiodarone, cardizem, metoprolol & eliquis   HTN: continue on metoprolol, cardizem & amiodarone   Hypothyroidism: continue on home dose of levothyroxine   GERD: continue on pantoprazole    DVT prophylaxis: eliquis  Code Status: full Family Communication: discussed pt's care w/ pt's son, [name], and answered his questions Disposition Plan: likely d/c back to SNF  Status is: Inpatient  Remains inpatient appropriate because:Ongoing diagnostic testing needed not appropriate for outpatient work up and IV treatments appropriate due to intensity of illness or inability to take PO   Dispo: The patient is from: SNF              Anticipated d/c is to: SNF              Anticipated d/c date is: 3 days              Patient currently is not medically stable to d/c.         Consultants:   Cardio    Procedures:   Antimicrobials:    Subjective: Pt c/o fatigue   Objective: Vitals:   04/09/20 1610 04/09/20 1924 04/10/20 0413 04/10/20 0719  BP: 112/79 103/68 103/63 99/81  Pulse: 93 91 82 90  Resp: 18 19 19 16   Temp: 98.3 F (36.8 C) 98.3 F (36.8 C) (!) 97.5 F (36.4 C) 98.1 F (36.7 C)  TempSrc: Oral Oral Oral   SpO2: 95% 99% 98% 99%  Weight:   67.9 kg   Height:        Intake/Output Summary (Last 24 hours) at 04/10/2020 0758 Last data filed at 04/10/2020 0000 Gross per 24 hour  Intake 960 ml  Output 2550 ml  Net -1590 ml   Filed Weights   04/09/20 0320 04/10/20 0413  Weight: 67.4 kg 67.9 kg    Examination:  General exam: Appears calm & comfortable  Respiratory system: decreased breath sounds b/l  Cardiovascular  system: Irregularly irregular.  No rubs or gallops Gastrointestinal system: Abdomen is nondistended, soft and nontender. Ostomy bag noted. Hypoactive bowel sounds heard. Central nervous system: Alert and oriented. Moves all 4 extremities  Psychiatry: Judgement and insight appear normal. Normal mood and affect     Data Reviewed: I have personally reviewed following labs and imaging studies  CBC: Recent Labs  Lab 04/08/20 2350 04/09/20 0419 04/10/20 0424  WBC 14.1* 13.2* 14.0*  NEUTROABS 11.5* 11.0*  --   HGB 10.8* 11.1* 10.2*  HCT 33.6* 34.6* 32.7*  MCV 82.2 82.4 83.2  PLT 313 311 271   Basic Metabolic Panel: Recent Labs  Lab 04/08/20 2350 04/09/20 0419 04/10/20 0424  NA 129*  --  132*  K 4.8  --  3.7  CL 97*  --  97*  CO2 22  --  27  GLUCOSE 117*  --  102*  BUN 11  --  12  CREATININE 0.81  --  0.86  CALCIUM 8.3*  --  8.4*  MG  --  2.0  --    GFR: Estimated Creatinine Clearance: 50.1 mL/min (by C-G formula based on SCr of 0.86 mg/dL). Liver Function Tests: Recent Labs  Lab  04/08/20 2350  AST 26  ALT 34  ALKPHOS 107  BILITOT 0.8  PROT 6.3*  ALBUMIN 2.7*   No results for input(s): LIPASE, AMYLASE in the last 168 hours. No results for input(s): AMMONIA in the last 168 hours. Coagulation Profile: No results for input(s): INR, PROTIME in the last 168 hours. Cardiac Enzymes: No results for input(s): CKTOTAL, CKMB, CKMBINDEX, TROPONINI in the last 168 hours. BNP (last 3 results) No results for input(s): PROBNP in the last 8760 hours. HbA1C: No results for input(s): HGBA1C in the last 72 hours. CBG: No results for input(s): GLUCAP in the last 168 hours. Lipid Profile: No results for input(s): CHOL, HDL, LDLCALC, TRIG, CHOLHDL, LDLDIRECT in the last 72 hours. Thyroid Function Tests: Recent Labs    04/09/20 0419  TSH 7.474*   Anemia Panel: No results for input(s): VITAMINB12, FOLATE, FERRITIN, TIBC, IRON, RETICCTPCT in the last 72 hours. Sepsis Labs: No  results for input(s): PROCALCITON, LATICACIDVEN in the last 168 hours.  Recent Results (from the past 240 hour(s))  Respiratory Panel by RT PCR (Flu A&B, Covid) - Nasopharyngeal Swab     Status: None   Collection Time: 04/02/20  1:29 PM   Specimen: Nasopharyngeal Swab  Result Value Ref Range Status   SARS Coronavirus 2 by RT PCR NEGATIVE NEGATIVE Final    Comment: (NOTE) SARS-CoV-2 target nucleic acids are NOT DETECTED.  The SARS-CoV-2 RNA is generally detectable in upper respiratoy specimens during the acute phase of infection. The lowest concentration of SARS-CoV-2 viral copies this assay can detect is 131 copies/mL. A negative result does not preclude SARS-Cov-2 infection and should not be used as the sole basis for treatment or other patient management decisions. A negative result may occur with  improper specimen collection/handling, submission of specimen other than nasopharyngeal swab, presence of viral mutation(s) within the areas targeted by this assay, and inadequate number of viral copies (<131 copies/mL). A negative result must be combined with clinical observations, patient history, and epidemiological information. The expected result is Negative.  Fact Sheet for Patients:  https://www.moore.com/  Fact Sheet for Healthcare Providers:  https://www.young.biz/  This test is no t yet approved or cleared by the [place] FDA and  has been authorized for detection and/or diagnosis of SARS-CoV-2 by FDA under an Emergency Use Authorization (EUA). This EUA will remain  in effect (meaning this test can be used) for the duration of the COVID-19 declaration under Section 564(b)(1) of the Act, 21 U.S.C. section 360bbb-3(b)(1), unless the authorization is terminated or revoked sooner.     Influenza A by PCR NEGATIVE NEGATIVE Final   Influenza B by PCR NEGATIVE NEGATIVE Final    Comment: (NOTE) The Xpert Xpress SARS-CoV-2/FLU/RSV assay  is intended as an aid in  the diagnosis of influenza from Nasopharyngeal swab specimens and  should not be used as a sole basis for treatment. Nasal washings and  aspirates are unacceptable for Xpert Xpress SARS-CoV-2/FLU/RSV  testing.  Fact Sheet for Patients: https://www.moore.com/  Fact Sheet for Healthcare Providers: https://www.young.biz/  This test is not yet approved or cleared by the [place] FDA and  has been authorized for detection and/or diagnosis of SARS-CoV-2 by  FDA under an Emergency Use Authorization (EUA). This EUA will remain  in effect (meaning this test can be used) for the duration of the  Covid-19 declaration under Section 564(b)(1) of the Act, 21  U.S.C. section 360bbb-3(b)(1), unless the authorization is  terminated or revoked. Performed at [facility], [address] [place], [place] [address]   Resp Panel by RT PCR (RSV, Flu A&B, Covid) - Nasopharyngeal Swab     Status: None   Collection Time: 04/09/20  1:02 AM   Specimen: Nasopharyngeal Swab  Result Value Ref Range Status   SARS Coronavirus 2 by RT PCR NEGATIVE NEGATIVE Final    Comment: (NOTE) SARS-CoV-2 target nucleic acids are NOT DETECTED.  The SARS-CoV-2 RNA is generally detectable in upper respiratoy specimens during the acute phase of infection. The lowest concentration of SARS-CoV-2 viral copies this assay can detect is 131 copies/mL. A negative result does not preclude SARS-Cov-2 infection and should not be used as the sole basis for treatment or other patient management decisions. A negative result may occur with  improper specimen collection/handling, submission of specimen other than nasopharyngeal swab, presence of viral mutation(s) within the areas targeted by this assay, and inadequate number of viral copies (<131 copies/mL). A negative result must be combined with clinical observations, patient history, and epidemiological  information. The expected result is Negative.  Fact Sheet for Patients:  https://www.moore.com/  Fact Sheet for Healthcare Providers:  https://www.young.biz/  This test is no t yet approved or cleared by the [place] FDA and  has been authorized for detection and/or diagnosis of SARS-CoV-2 by FDA under an Emergency Use Authorization (EUA). This EUA will remain  in effect (meaning this test can be used) for the duration of the COVID-19 declaration under Section 564(b)(1) of the Act, 21 U.S.C. section 360bbb-3(b)(1), unless the authorization is terminated or revoked sooner.     Influenza A by PCR NEGATIVE NEGATIVE Final   Influenza B by PCR NEGATIVE NEGATIVE Final    Comment: (NOTE) The Xpert Xpress SARS-CoV-2/FLU/RSV assay is intended as an aid in  the diagnosis of influenza from Nasopharyngeal swab specimens and  should not be used as a sole basis for treatment. Nasal washings and  aspirates are unacceptable for Xpert Xpress SARS-CoV-2/FLU/RSV  testing.  Fact Sheet for Patients: https://www.moore.com/  Fact Sheet for Healthcare Providers: https://www.young.biz/  This test is not yet approved or cleared by the [place] FDA and  has been authorized for detection and/or diagnosis of SARS-CoV-2 by  FDA under an Emergency Use Authorization (EUA). This EUA will remain  in effect (meaning this test can be used) for the duration of the  Covid-19 declaration under Section 564(b)(1) of the Act, 21  U.S.C. section 360bbb-3(b)(1), unless the authorization is  terminated or revoked.    Respiratory Syncytial Virus by PCR NEGATIVE NEGATIVE Final    Comment: (NOTE) Fact Sheet for Patients: https://www.moore.com/  Fact Sheet for Healthcare Providers: https://www.young.biz/  This test is not yet approved or cleared by the [place] FDA and  has been  authorized for detection and/or diagnosis of SARS-CoV-2 by  FDA under an Emergency Use Authorization (EUA). This EUA will remain  in effect (meaning this test can be used) for the duration of the  COVID-19 declaration under Section 564(b)(1) of the Act, 21 U.S.C.  section 360bbb-3(b)(1), unless the authorization is terminated or  revoked. Performed at [facility], [place], [address] [place], [place] [address]          Radiology Studies: DG Chest [place] 1 View  Result Date: 04/09/2020 CLINICAL DATA:  Shortness of breath EXAM: PORTABLE CHEST 1 VIEW COMPARISON:  Chest x-ray dated 03/28/2020 FINDINGS: There is cardiomegaly with evidence for congestive heart failure. There are moderate to large bilateral pleural effusions, right greater than left. There is no pneumothorax.  Bibasilar airspace disease is noted and favored to represent atelectasis. IMPRESSION: 1. Cardiomegaly with evidence for congestive heart failure. 2. Moderate to large bilateral pleural effusions, right greater than left. Electronically Signed   By: Katherine Mantlehristopher  Green M.D.   On: 04/09/2020 00:27        Scheduled Meds: . amiodarone  200 mg Oral Daily  . apixaban  5 mg Oral BID  . aspirin EC  81 mg Oral Daily  . diltiazem  120 mg Oral Daily  . folic acid  1 mg Oral Daily  . furosemide  40 mg Intravenous BID  . hydrocerin  1 application Topical BID  . levothyroxine  75 mcg Oral Q0600  . metoprolol succinate  50 mg Oral BID  . midodrine  2.5 mg Oral TID WC  . multivitamin with minerals  1 tablet Oral Daily  . pantoprazole  40 mg Oral Daily  . potassium chloride  10 mEq Oral Daily  . Ensure Max Protein  11 oz Oral BID  . sodium chloride flush  3 mL Intravenous Q12H   Continuous Infusions: . sodium chloride       LOS: 1 day    Time spent: 30 mins     Charise KillianJamiese M Dontrail Blackwell, MD [facility] Pager [phone]  If 7PM-7AM, please contact night-coverage 04/10/2020, 7:58 AM

## 2020-04-10 NOTE — Evaluation (Signed)
Occupational Therapy Evaluation Patient Details Name: [name] MRN: [id number] DOB: [birth year] Today's Date: 04/10/2020    History of Present Illness Suprena Travaglini is a 74yoF who comes to [facility] on 11/7 from [facility] with increased SOB adn CP. PMH: dCHF, HTN, IBS, CVA, seizures, AF on Eliquis- pt uses O2 at night. 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 Augmentin.  She was admitted here severe sepsis on 10/28 and aggressively hydrated.   Clinical Impression   Pt was seen for OT evaluation this date. Prior to hospital admission, pt was MOD I with fxl mobiltiy with SPC, able to preform basic self care I'ly/MOD I and had facility assist for IADLs such as meal and med mgt. Pt from [facility], but at baseline, lives in apt alone with level entry. Currently pt demonstrates impairments as described below (See OT problem list) which functionally limit her ability to perform ADL/self-care tasks. Pt currently requires MOD A With bed mobility, declines to attempt transfer with OT at this time, MAX A to manage ostomy and MIN/MOD A for bathing.  Pt would benefit from skilled OT services to address noted impairments and functional limitations (see below for any additional details) in order to maximize safety and independence while minimizing falls risk and caregiver burden. Upon hospital discharge, recommend STR to maximize pt safety and return to PLOF.     Follow Up Recommendations  SNF    Equipment Recommendations  Other (comment) (defer to next level of care)    Recommendations for Other Services       Precautions / Restrictions Precautions Precaution Comments: R quadrant ostomy; laparotomy abdominal wound Restrictions Weight Bearing Restrictions: No      Mobility Bed Mobility Overal bed mobility: Needs Assistance Bed Mobility: Supine to Sit;Sit  to Supine     Supine to sit: Mod assist Sit to supine: Mod assist   General bed mobility comments: Pt requires assist to manage trunk    Transfers                 General transfer comment: pt declines to participate in transfers with OT at this time.    Balance Overall balance assessment: Needs assistance   Sitting balance-Leahy Scale: Good         Standing balance comment: NT                           ADL either performed or assessed with clinical judgement   ADL Overall ADL's : Needs assistance/impaired     Grooming: Set up;Sitting   Upper Body Bathing: Minimal assistance;Sitting   Lower Body Bathing: Moderate assistance;Sitting/lateral leans   Upper Body Dressing : Set up;Sitting   Lower Body Dressing: Moderate assistance;Sitting/lateral leans       Toileting- Clothing Manipulation and Hygiene: Maximal assistance;Bed level Toileting - Clothing Manipulation Details (indicate cue type and reason): to empty ostomy, pt reports she's had some training. OT facilitates light training on how to empty. Pt with moderate recall of previous training sessions.     Functional mobility during ADLs:  (pt declines to get up and walk with OT at this time.)       Vision Baseline Vision/History: Wears glasses Wears Glasses: At all times Patient Visual Report: No change from baseline       Perception     Praxis  Pertinent Vitals/Pain Pain Assessment: Faces Faces Pain Scale: Hurts a little bit Pain Location: sore throat improved with throat numbing spray this date. Pain Descriptors / Indicators: Guarding Pain Intervention(s): Monitored during session     Hand Dominance Right   Extremity/Trunk Assessment Upper Extremity Assessment Upper Extremity Assessment: Overall WFL for tasks assessed;Generalized weakness   Lower Extremity Assessment Lower Extremity Assessment: Overall WFL for tasks assessed;Generalized weakness   Cervical / Trunk  Assessment Cervical / Trunk Assessment: Kyphotic   Communication Communication Communication: No difficulties   Cognition Arousal/Alertness: Awake/alert Behavior During Therapy: WFL for tasks assessed/performed Overall Cognitive Status: Within Functional Limits for tasks assessed                                     General Comments       Exercises Other Exercises Other Exercises: OT facilitates ed re: role of OT, importance of OOB actibvity including prevention of skin breakdown, PNA, and muscle atrophy. Pt with moderate understanding.   Shoulder Instructions      Home Living Family/patient expects to be discharged to:: Skilled nursing facility Living Arrangements: Alone Available Help at Discharge: Family;Available PRN/intermittently (son currently admitted to [facility]) Type of Home: Apartment Home Access: Level entry     Home Layout: One level     Bathroom Shower/Tub: Chief Strategy Officer: Standard     Home Equipment: Cane - quad   Additional Comments: PT at [facility] recommending transition to 4WW      Prior Functioning/Environment Level of Independence: Needs assistance  Gait / Transfers Assistance Needed: Uses [facility] for community ambulation, no falls reported. Does not drive. ADL's / Homemaking Assistance Needed: Family assists with groceries once a month and clipping pt toenails. Does not require assist for other ADL. facility manages med mgt and meals.   Comments: Members of pt's church live in her apartment and come by to check on her daily.        OT Problem List: Decreased strength;Decreased activity tolerance;Impaired balance (sitting and/or standing);Decreased knowledge of use of DME or AE      OT Treatment/Interventions: Self-care/ADL training;DME and/or AE instruction;Therapeutic activities;Balance training;Therapeutic exercise;Patient/family education    OT Goals(Current goals can be found in the care plan section) Acute Rehab OT  Goals Patient Stated Goal: to complete rehab and return home  OT Goal Formulation: With patient Time For Goal Achievement: 04/24/20 Potential to Achieve Goals: Good ADL Goals Pt Will Perform Lower Body Dressing: with min assist Pt Will Transfer to Toilet: with min guard assist Pt Will Perform Toileting - Clothing Manipulation and hygiene: with min assist  OT Frequency: Min 1X/week   Barriers to D/C:            Co-evaluation              AM-PAC OT "6 Clicks" Daily Activity     Outcome Measure Help from another person eating meals?: None Help from another person taking care of personal grooming?: A Little Help from another person toileting, which includes using toliet, bedpan, or urinal?: A Lot Help from another person bathing (including washing, rinsing, drying)?: A Lot Help from another person to put on and taking off regular upper body clothing?: A Little Help from another person to put on and taking off regular lower body clothing?: A Lot 6 Click Score: 16   End of Session Nurse Communication: Mobility status  Activity Tolerance: Patient tolerated  treatment well Patient left: in bed;with call bell/phone within reach;with bed alarm set  OT Visit Diagnosis: Unsteadiness on feet (R26.81);Muscle weakness (generalized) (M62.81)                Time: 3734-2876 OT Time Calculation (min): 38 min Charges:  OT General Charges $OT Visit: 1 Visit OT Evaluation $OT Eval Moderate Complexity: 1 Mod OT Treatments $Self Care/Home Management : 8-22 mins $Therapeutic Activity: 8-22 mins  Rejeana Brock, MS, OTR/L ascom [phone] 04/10/20, 4:02 PM

## 2020-04-10 NOTE — Progress Notes (Signed)
Mobility Specialist - Progress Note   04/10/20 1145  Mobility  Activity  (supine exercises)  Range of Motion/Exercises Right leg;Left leg (ankle pumps, heel slides, slr, hip add/abd)  Level of Assistance Independent  Assistive Device None  Distance Ambulated (ft) 0 ft  Mobility Response Tolerated well  Mobility performed by Mobility specialist  $Mobility charge 1 Mobility    Pre-mobility: 88 HR, 94% SpO2 Post-mobility: 95 HR, 95% SpO2   Pt was lying in bed upon arrival. Pt agreed to session. Pt denied any pain, nausea, or fatigue. Pt stated she had just finished working with PT and preferred to remain in bed for mobility session. Pt was able to perform supine exercises: ankle pumps (15x/leg), heel slides, straight leg raises, and hip add/abd (10x/leg) independently. Pt c/o feeling a little tired post-session. Overall, pt tolerated session well. Pt remains in bed with all needs in reach and alarm set.    Filiberto Pinks Mobility Specialist 04/10/20, 11:49 AM

## 2020-04-10 NOTE — Progress Notes (Signed)
Physical Therapy Treatment Patient Details Name: [name] MRN: [id number] DOB: 08-13-1945 Today's Date: 04/10/2020    History of Present Illness [name] is a 74yoF who comes to [facility] on 11/7 from [facility] with increased SOB adn CP. PMH: dCHF, HTN, IBS, CVA, seizures, AF on Eliquis- pt uses O2 at night. 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 Augmentin.  She was admitted here severe sepsis on 10/28 and aggressively hydrated.    PT Comments    Patient is pleasant and cooperative during PT session. Patient is making progress towards meeting functional PT goals. Patient needs assistance for basic mobility in bed and transfers. Min guard assistance provided with occasional cues for body mechanics and proper rolling walker placement with ambulation. Patient fatigued with activity and has limited overall standing activity tolerance. Recommend to continue PT to address remaining functional limitations. Recommendations for SNF placement remain appropriate at this time.    Follow Up Recommendations  SNF;Supervision - Intermittent     Equipment Recommendations  None recommended by PT    Recommendations for Other Services       Precautions / Restrictions Precautions Precautions: Fall Precaution Comments: R quadrant ostomy; laparotomy abdominal wound Restrictions Weight Bearing Restrictions: No    Mobility  Bed Mobility Overal bed mobility: Needs Assistance Bed Mobility: Supine to Sit;Sit to Supine     Supine to sit: Mod assist Sit to supine: Mod assist   General bed mobility comments: Assistance required for trunk support to achieve upright sitting position. Assistance for BLE support and cues for technique. Extra time required to complete tasks due to fatigue.   Transfers Overall transfer level: Needs assistance Equipment used:  Rolling walker (2 wheeled) Transfers: Sit to/from Stand Sit to Stand: Min assist         General transfer comment: Verbal cues for hand placement for safety with transfers. Lifting assistance is required for standing   Ambulation/Gait Ambulation/Gait assistance: Min guard Gait Distance (Feet): 28 Feet Assistive device: Rolling walker (2 wheeled) Gait Pattern/deviations: Step-to pattern Gait velocity: decreased   General Gait Details: Verbal cues to keep rolling walker closer to base of support for improved body mechanics. Patient is fatigued with ambulation. Heart rate 98bpm and Sp02 99% on room air.    Stairs             Wheelchair Mobility    Modified Rankin (Stroke Patients Only)       Balance   Sitting-balance support: No upper extremity supported Sitting balance-Leahy Scale: Good Sitting balance - Comments: no loss of balance in sitting position    Standing balance support: Bilateral upper extremity supported Standing balance-Leahy Scale: Fair Standing balance comment: Patient relying heavily on rolling walker for UE support in standing position                             Cognition Arousal/Alertness: Awake/alert Behavior During Therapy: WFL for tasks assessed/performed Overall Cognitive Status: Within Functional Limits for tasks assessed                                        Exercises General Exercises - Lower Extremity Ankle Circles/Pumps: AROM;Strengthening;Both;10 reps;Seated Long Arc Quad: AROM;Strengthening;10 reps;Seated;AAROM (occasional AAROM to achieve end range of motion ) Hip  Flexion/Marching: AROM;Strengthening;Both;10 reps;Seated Other Exercises Other Exercises: verbal and visual cues for exercise technique.     General Comments        Pertinent Vitals/Pain Pain Location: complaint about sore throat that has improved with mouth spray,  otherwise no pain reported     Home Living                       Prior Function            PT Goals (current goals can now be found in the care plan section) Acute Rehab PT Goals Patient Stated Goal: to complete rehab and return home  PT Goal Formulation: With patient Time For Goal Achievement: 04/23/20 Potential to Achieve Goals: Fair Progress towards PT goals: Progressing toward goals    Frequency    Min 2X/week      PT Plan Current plan remains appropriate    Co-evaluation              AM-PAC PT "6 Clicks" Mobility   Outcome Measure  Help needed turning from your back to your side while in a flat bed without using bedrails?: A Lot Help needed moving from lying on your back to sitting on the side of a flat bed without using bedrails?: A Lot Help needed moving to and from a bed to a chair (including a wheelchair)?: A Lot Help needed standing up from a chair using your arms (e.g., wheelchair or bedside chair)?: A Little Help needed to walk in hospital room?: A Little Help needed climbing 3-5 steps with a railing? : A Lot 6 Click Score: 14    End of Session Equipment Utilized During Treatment: Gait belt Activity Tolerance: Patient tolerated treatment well Patient left: in bed;with call bell/phone within reach;with bed alarm set (MD in the room at end of session ) Nurse Communication: Mobility status (via white board updated with mobility status ) PT Visit Diagnosis: Unsteadiness on feet (R26.81);Muscle weakness (generalized) (M62.81)     Time: 7989-2119 PT Time Calculation (min) (ACUTE ONLY): 26 min  Charges:  $Gait Training: 8-22 mins $Therapeutic Exercise: 8-22 mins                     Donna Bernard, PT, MPT    [name] 04/10/2020, 10:15 AM

## 2020-04-11 ENCOUNTER — Inpatient Hospital Stay: Payer: Medicare Other

## 2020-04-11 DIAGNOSIS — I5031 Acute diastolic (congestive) heart failure: Secondary | ICD-10-CM | POA: Diagnosis not present

## 2020-04-11 LAB — BASIC METABOLIC PANEL
Anion gap: 7 (ref 5–15)
BUN: 10 mg/dL (ref 8–23)
CO2: 29 mmol/L (ref 22–32)
Calcium: 8.4 mg/dL — ABNORMAL LOW (ref 8.9–10.3)
Chloride: 94 mmol/L — ABNORMAL LOW (ref 98–111)
Creatinine, Ser: 0.64 mg/dL (ref 0.44–1.00)
GFR, Estimated: >60 mL/min (ref >60)
Glucose, Bld: 99 mg/dL (ref 70–99)
Potassium: 3.6 mmol/L (ref 3.5–5.1)
Sodium: 130 mmol/L — ABNORMAL LOW (ref 135–145)

## 2020-04-11 LAB — CBC
HCT: 33.7 % — ABNORMAL LOW (ref 36.0–46.0)
Hemoglobin: 11 g/dL — ABNORMAL LOW (ref 12.0–15.0)
MCH: 26.6 pg (ref 26.0–34.0)
MCHC: 32.6 g/dL (ref 30.0–36.0)
MCV: 81.4 fL (ref 80.0–100.0)
Platelets: 305 10*3/uL (ref 150–400)
RBC: 4.14 MIL/uL (ref 3.87–5.11)
RDW: 16.4 % — ABNORMAL HIGH (ref 11.5–15.5)
WBC: 11.9 10*3/uL — ABNORMAL HIGH (ref 4.0–10.5)
nRBC: 0 % (ref 0.0–0.2)

## 2020-04-11 MED ORDER — POTASSIUM CHLORIDE CRYS ER 20 MEQ PO TBCR
40.0000 meq | EXTENDED_RELEASE_TABLET | Freq: Once | ORAL | Status: AC
  Administered 2020-04-11: 40 meq via ORAL
  Filled 2020-04-11: qty 2

## 2020-04-11 NOTE — Progress Notes (Signed)
Mobility Specialist - Progress Note   04/11/20 1118  Mobility  Activity Ambulated in room  Level of Assistance Standby assist, set-up cues, supervision of patient - no hands on (CGA for safety)  Assistive Device Front wheel walker  Distance Ambulated (ft) 60 ft  Mobility Response Tolerated well  Mobility performed by Mobility specialist  $Mobility charge 1 Mobility    Pre-mobility: 86 HR, 97% SpO2 Post-mobility: 103 HR, 98% SpO2  Pt laying in bed w/ HOB elevated upon arrival. Pt agreed to session. Pt able to get to EOB SBA. Pt S2S CGA. VC required for hand placement and sequencing during S2S. Once fully standing, pt c/o "7/10 pain running in the back of my legs". However, pain doesn't limit mobility. Pt ambulated 60' total in room using a RW w/ SBA. CGA utilized for safety. No LOB noted. No c/o SOB. Pt states she no longer has any pain in the back of her legs when she started ambulating. O2 sat >/= 95%. Pt on RA. HR was 108-118 during ambulation. Overall, pt tolerated session very well. Pt left laying in bed w/ alarm set. All needs placed in reach.     Cristle Jared Mobility Specialist  04/11/20, 11:24 AM

## 2020-04-11 NOTE — Progress Notes (Signed)
[facility]  SUBJECTIVE: Patient laying in bed, reports feeling better, on O2 by nasal cannula   Vitals:   04/10/20 1547 04/10/20 1946 04/11/20 0446 04/11/20 0743  BP: (!) 109/53 104/70 105/78 101/88  Pulse: 99 93 89 95  Resp: 16 16 16 18   Temp: 98 F (36.7 C) 98.4 F (36.9 C) 97.7 F (36.5 C) 98.4 F (36.9 C)  TempSrc:  Oral Oral Oral  SpO2: 97% 97% 98% 100%  Weight:   66.2 kg   Height:         Intake/Output Summary (Last 24 hours) at 04/11/2020 0810 Last data filed at 04/11/2020 0544 Gross per 24 hour  Intake 960 ml  Output 3608 ml  Net -2648 ml      PHYSICAL EXAM  General: Well developed, well nourished, in no acute distress HEENT:  Normocephalic and atramatic Neck:  No JVD.  Lungs: Clear bilaterally to auscultation and percussion. Heart: HRRR . Normal S1 and S2 without gallops or murmurs.  Abdomen: Bowel sounds are positive, abdomen soft and non-tender  Msk:  Back normal, normal gait. Normal strength and tone for age. Extremities: No clubbing, cyanosis or edema.   Neuro: Alert and oriented X 3. Psych:  Good affect, responds appropriately   LABS: Basic Metabolic Panel: Recent Labs    04/08/20 2350 04/09/20 0419 04/10/20 0424 04/11/20 0546  NA   < >  --  132* 130*  K   < >  --  3.7 3.6  CL   < >  --  97* 94*  CO2   < >  --  27 29  GLUCOSE   < >  --  102* 99  BUN   < >  --  12 10  CREATININE   < >  --  0.86 0.64  CALCIUM   < >  --  8.4* 8.4*  MG  --  2.0  --   --    < > = values in this interval not displayed.   Liver Function Tests: Recent Labs    04/08/20 2350  AST 26  ALT 34  ALKPHOS 107  BILITOT 0.8  PROT 6.3*  ALBUMIN 2.7*   No results for input(s): LIPASE, AMYLASE in the last 72 hours. CBC: Recent Labs    04/08/20 2350 04/08/20 2350 04/09/20 0419 04/09/20 0419 04/10/20 0424 04/11/20 0546  WBC 14.1*   < > 13.2*   < > 14.0* 11.9*  NEUTROABS 11.5*  --  11.0*  --   --   --   HGB 10.8*   < > 11.1*   < > 10.2* 11.0*  HCT  33.6*   < > 34.6*   < > 32.7* 33.7*  MCV 82.2   < > 82.4   < > 83.2 81.4  PLT 313   < > 311   < > 271 305   < > = values in this interval not displayed.   Cardiac Enzymes: No results for input(s): CKTOTAL, CKMB, CKMBINDEX, TROPONINI in the last 72 hours. BNP: Invalid input(s): POCBNP D-Dimer: No results for input(s): DDIMER in the last 72 hours. Hemoglobin A1C: No results for input(s): HGBA1C in the last 72 hours. Fasting Lipid Panel: No results for input(s): CHOL, HDL, LDLCALC, TRIG, CHOLHDL, LDLDIRECT in the last 72 hours. Thyroid Function Tests: Recent Labs    04/09/20 0419  TSH 7.474*   Anemia Panel: No results for input(s): VITAMINB12, FOLATE, FERRITIN, TIBC, IRON, RETICCTPCT in the last 72 hours.  No results found.  Echo LVEF 55 to 60%  TELEMETRY: Atrial fibrillation 90 to 100 bpm:  ASSESSMENT AND PLAN:  Active Problems:   Acute CHF (congestive heart failure) (HCC)    1. 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 Chest xray this morning reveals bilateral pleural effusions.  Troponin normal x 2. BNP 1,381. 2. Paroxysmal atrial fibrillation, on Eliquis for stroke prevention, and Cardizem CD and metoprolol succinate for rate control, and amiodarone 200 mg daily for rhythm control. Heart rate in the  90 to 100 bpm this morning. 3. Essential hypertension, well controlled 4. Mitral regurgitation,2D echocardiogram on 12/06/2019 revealed normal left ventricular function with LVEF 55 to 60% with moderate mitral valve prolapse, mild mitral regurgitation with mildly elevated pulmonary artery systolic pressure. 5. History of stroke 6. Pulmonary hypertension 7. Hypothyroidism, TSH 7.474 (up from 1.828 three weeks ago). Her dose of levothyroxine was  increased.   Recommendations  1.  Agree with current therapy 2.  Continue diuresis 3.  Carefully monitor renal status 4.  Continue Eliquis for stroke prevention 5.  Continue Cardizem CD, metoprolol succinate and amiodarone for rate control  Marcina Millard, MD, PhD, [facility] 04/11/2020 8:10 AM

## 2020-04-11 NOTE — Progress Notes (Addendum)
PROGRESS NOTE    [name]  TIR:[id number] DOB: 1945/06/07 DOA: 04/08/2020 PCP: Mickel Fuchs, MD   74 year old female referred for evaluation of acute on chronic diastolic congestive heart failure. The patient has a history of paroxsymal atrial fibrillation on Eliquis, moderate mitral valve prolapse with mild regurgitation, essential hypertension, history of stroke, and pulmonary hypertension. 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 During the admission for sepsis, she was aggressively hydrated. The patient presented to [facility] ER 04/08/2020 from [facility] where she currently resides for rehabilitation, for a few day history of worsening shortness of breath, peripheral edema, orthopnea, and palpitations with tachycardia. She wears supplemental oxygen at night. She was hypoxic with oxygen saturations in the upper 80s, and was placed on 2L O2. ECG revealed atrial fibrillation at a rate of 103 bpm without acute ST wave abnormalities. Chest xray showed cardiomegaly with moderate to large bilateral pleural effusion, right greater than left. BNP elevated to 1381, troponin normal, WBC 14.1, TSH 7.474 (up from 1.828 three weeks ago).  Her dose of levothyroxine was increased. The patient was started on IV Lasix. At this time, she denies palpitations or chest pain.   Assessment & Plan:   Active Problems:   Acute CHF (congestive heart failure) (HCC)   # Aacute on chronic diastolic CHF: improving - continue on IV lasix 40 bid. Strict I/Os and daily weights. Fluid restriction. Out 3600 ml yesterday - k 3.6 this morning, will give 40 meq  # History diverticulitis s/p colectomy. Recent hospitalization for infection at surgical site, now resolved. Ostomy output appears normal - will need outpt f/u w/ Dr. Claudine Mouton  PAF: rate controlled.  -  continue on amiodarone, cardizem, metoprolol & eliquis   HTN: controlled - continue on metoprolol, cardizem & amiodarone   Hypothyroidism:  - continue on home dose of levothyroxine   GERD:  - continue on pantoprazole    DVT prophylaxis: eliquis  Code Status: full Family Communication: previous physician discussed pt's care w/ pt's son, [name], and answered his questions Disposition Plan: back to SNF (bed is available)  Status is: Inpatient  Remains inpatient appropriate because:Ongoing diagnostic testing needed not appropriate for outpatient work up and IV treatments appropriate due to intensity of illness or inability to take PO   Dispo: The patient is from: SNF              Anticipated d/c is to: SNF              Anticipated d/c date is: 2 days              Patient currently is not medically stable to d/c.         Consultants:   Cardio    Procedures:   Antimicrobials:    Subjective: Pt feeling well. No chest pain. Slightly dizzy when up with pt but no shortness of breath or palpitations.   Objective: Vitals:   04/10/20 1547 04/10/20 1946 04/11/20 0446 04/11/20 0743  BP: (!) 109/53 104/70 105/78 101/88  Pulse: 99 93 89 95  Resp: 16 16 16 18   Temp: 98 F (36.7 C) 98.4 F (36.9 C) 97.7 F (36.5 C) 98.4 F (36.9 C)  TempSrc:  Oral Oral Oral  SpO2: 97% 97% 98% 100%  Weight:   66.2 kg   Height:        Intake/Output Summary (Last 24 hours)  at 04/11/2020 1127 Last data filed at 04/11/2020 0950 Gross per 24 hour  Intake 480 ml  Output 3958 ml  Net -3478 ml   Filed Weights   04/09/20 0320 04/10/20 0413 04/11/20 0446  Weight: 67.4 kg 67.9 kg 66.2 kg    Examination:  General exam: Appears calm & comfortable  Respiratory system: decreased breath sounds b/l with faint rales Cardiovascular system: Irregularly irregular.  Harsh holosystolic murmur Gastrointestinal system: Abdomen is nondistended, soft and nontender. Ostomy bag noted. Hypoactive  bowel sounds heard. C/d/i around ostomy bag Central nervous system: Alert and oriented. Moves all 4 extremities  Psychiatry: Judgement and insight appear normal. Normal mood and affect     Data Reviewed: I have personally reviewed following labs and imaging studies  CBC: Recent Labs  Lab 04/08/20 2350 04/09/20 0419 04/10/20 0424 04/11/20 0546  WBC 14.1* 13.2* 14.0* 11.9*  NEUTROABS 11.5* 11.0*  --   --   HGB 10.8* 11.1* 10.2* 11.0*  HCT 33.6* 34.6* 32.7* 33.7*  MCV 82.2 82.4 83.2 81.4  PLT 313 311 271 305   Basic Metabolic Panel: Recent Labs  Lab 04/08/20 2350 04/09/20 0419 04/10/20 0424 04/11/20 0546  NA 129*  --  132* 130*  K 4.8  --  3.7 3.6  CL 97*  --  97* 94*  CO2 22  --  27 29  GLUCOSE 117*  --  102* 99  BUN 11  --  12 10  CREATININE 0.81  --  0.86 0.64  CALCIUM 8.3*  --  8.4* 8.4*  MG  --  2.0  --   --    GFR: Estimated Creatinine Clearance: 53.8 mL/min (by C-G formula based on SCr of 0.64 mg/dL). Liver Function Tests: Recent Labs  Lab 04/08/20 2350  AST 26  ALT 34  ALKPHOS 107  BILITOT 0.8  PROT 6.3*  ALBUMIN 2.7*   No results for input(s): LIPASE, AMYLASE in the last 168 hours. No results for input(s): AMMONIA in the last 168 hours. Coagulation Profile: No results for input(s): INR, PROTIME in the last 168 hours. Cardiac Enzymes: No results for input(s): CKTOTAL, CKMB, CKMBINDEX, TROPONINI in the last 168 hours. BNP (last 3 results) No results for input(s): PROBNP in the last 8760 hours. HbA1C: No results for input(s): HGBA1C in the last 72 hours. CBG: No results for input(s): GLUCAP in the last 168 hours. Lipid Profile: No results for input(s): CHOL, HDL, LDLCALC, TRIG, CHOLHDL, LDLDIRECT in the last 72 hours. Thyroid Function Tests: Recent Labs    04/09/20 0419  TSH 7.474*   Anemia Panel: No results for input(s): VITAMINB12, FOLATE, FERRITIN, TIBC, IRON, RETICCTPCT in the last 72 hours. Sepsis Labs: No results for input(s):  PROCALCITON, LATICACIDVEN in the last 168 hours.  Recent Results (from the past 240 hour(s))  Respiratory Panel by RT PCR (Flu A&B, Covid) - Nasopharyngeal Swab     Status: None   Collection Time: 04/02/20  1:29 PM   Specimen: Nasopharyngeal Swab  Result Value Ref Range Status   SARS Coronavirus 2 by RT PCR NEGATIVE NEGATIVE Final    Comment: (NOTE) SARS-CoV-2 target nucleic acids are NOT DETECTED.  The SARS-CoV-2 RNA is generally detectable in upper respiratoy specimens during the acute phase of infection. The lowest concentration of SARS-CoV-2 viral copies this assay can detect is 131 copies/mL. A negative result does not preclude SARS-Cov-2 infection and should not be used as the sole basis for treatment or other patient management decisions. A negative result may occur with  improper specimen collection/handling, submission of specimen other than nasopharyngeal swab, presence of viral mutation(s) within the areas targeted by this assay, and inadequate number of viral copies (<131 copies/mL). A negative result must be combined with clinical observations, patient history, and epidemiological information. The expected result is Negative.  Fact Sheet for Patients:  https://www.moore.com/  Fact Sheet for Healthcare Providers:  https://www.young.biz/  This test is no t yet approved or cleared by the [place] FDA and  has been authorized for detection and/or diagnosis of SARS-CoV-2 by FDA under an Emergency Use Authorization (EUA). This EUA will remain  in effect (meaning this test can be used) for the duration of the COVID-19 declaration under Section 564(b)(1) of the Act, 21 U.S.C. section 360bbb-3(b)(1), unless the authorization is terminated or revoked sooner.     Influenza A by PCR NEGATIVE NEGATIVE Final   Influenza B by PCR NEGATIVE NEGATIVE Final    Comment: (NOTE) The Xpert Xpress SARS-CoV-2/FLU/RSV assay is intended as an aid  in  the diagnosis of influenza from Nasopharyngeal swab specimens and  should not be used as a sole basis for treatment. Nasal washings and  aspirates are unacceptable for Xpert Xpress SARS-CoV-2/FLU/RSV  testing.  Fact Sheet for Patients: https://www.moore.com/  Fact Sheet for Healthcare Providers: https://www.young.biz/  This test is not yet approved or cleared by the [place] FDA and  has been authorized for detection and/or diagnosis of SARS-CoV-2 by  FDA under an Emergency Use Authorization (EUA). This EUA will remain  in effect (meaning this test can be used) for the duration of the  Covid-19 declaration under Section 564(b)(1) of the Act, 21  U.S.C. section 360bbb-3(b)(1), unless the authorization is  terminated or revoked. Performed at [facility], [address] [place], [place] [address]   Resp Panel by RT PCR (RSV, Flu A&B, Covid) - Nasopharyngeal Swab     Status: None   Collection Time: 04/09/20  1:02 AM   Specimen: Nasopharyngeal Swab  Result Value Ref Range Status   SARS Coronavirus 2 by RT PCR NEGATIVE NEGATIVE Final    Comment: (NOTE) SARS-CoV-2 target nucleic acids are NOT DETECTED.  The SARS-CoV-2 RNA is generally detectable in upper respiratoy specimens during the acute phase of infection. The lowest concentration of SARS-CoV-2 viral copies this assay can detect is 131 copies/mL. A negative result does not preclude SARS-Cov-2 infection and should not be used as the sole basis for treatment or other patient management decisions. A negative result may occur with  improper specimen collection/handling, submission of specimen other than nasopharyngeal swab, presence of viral mutation(s) within the areas targeted by this assay, and inadequate number of viral copies (<131 copies/mL). A negative result must be combined with clinical observations, patient history, and epidemiological information. The expected  result is Negative.  Fact Sheet for Patients:  https://www.moore.com/  Fact Sheet for Healthcare Providers:  https://www.young.biz/  This test is no t yet approved or cleared by the [place] FDA and  has been authorized for detection and/or diagnosis of SARS-CoV-2 by FDA under an Emergency Use Authorization (EUA). This EUA will remain  in effect (meaning this test can be used) for the duration of the COVID-19 declaration under Section 564(b)(1) of the Act, 21 U.S.C. section 360bbb-3(b)(1), unless the authorization is terminated or revoked sooner.     Influenza A by PCR NEGATIVE NEGATIVE Final   Influenza B by PCR NEGATIVE NEGATIVE Final    Comment: (NOTE) The Xpert Xpress SARS-CoV-2/FLU/RSV assay is intended as an aid  in  the diagnosis of influenza from Nasopharyngeal swab specimens and  should not be used as a sole basis for treatment. Nasal washings and  aspirates are unacceptable for Xpert Xpress SARS-CoV-2/FLU/RSV  testing.  Fact Sheet for Patients: https://www.moore.com/https://www.fda.gov/media/142436/download  Fact Sheet for Healthcare Providers: https://www.young.biz/https://www.fda.gov/media/142435/download  This test is not yet approved or cleared by the [place] FDA and  has been authorized for detection and/or diagnosis of SARS-CoV-2 by  FDA under an Emergency Use Authorization (EUA). This EUA will remain  in effect (meaning this test can be used) for the duration of the  Covid-19 declaration under Section 564(b)(1) of the Act, 21  U.S.C. section 360bbb-3(b)(1), unless the authorization is  terminated or revoked.    Respiratory Syncytial Virus by PCR NEGATIVE NEGATIVE Final    Comment: (NOTE) Fact Sheet for Patients: https://www.moore.com/https://www.fda.gov/media/142436/download  Fact Sheet for Healthcare Providers: https://www.young.biz/https://www.fda.gov/media/142435/download  This test is not yet approved or cleared by the [place] FDA and  has been authorized for detection and/or  diagnosis of SARS-CoV-2 by  FDA under an Emergency Use Authorization (EUA). This EUA will remain  in effect (meaning this test can be used) for the duration of the  COVID-19 declaration under Section 564(b)(1) of the Act, 21 U.S.C.  section 360bbb-3(b)(1), unless the authorization is terminated or  revoked. Performed at [facility], [address] [place], [place] [address]          Radiology Studies: DG Chest 2 View  Result Date: 04/11/2020 CLINICAL DATA:  74 year old female with a history congestive heart failure EXAM: CHEST - 2 VIEW COMPARISON:  04/09/2020, CT chest 03/28/2020 FINDINGS: Cardiomediastinal silhouette unchanged with the heart borders partially obscured by overlying lung and pleural disease. Opacity at the bilateral lung bases partially obscuring the hemidiaphragm and heart borders. No pneumothorax. Opacity at the posterior lung base on the lateral view. Mild interlobular septal thickening. IMPRESSION: Bilateral pleural effusions with associated atelectasis/consolidation. Electronically Signed   By: Gilmer MorJaime  Wagner D.O.   On: 04/11/2020 08:10        Scheduled Meds: . amiodarone  200 mg Oral Daily  . apixaban  5 mg Oral BID  . diltiazem  120 mg Oral Daily  . folic acid  1 mg Oral Daily  . furosemide  40 mg Intravenous BID  . hydrocerin  1 application Topical BID  . levothyroxine  75 mcg Oral Q0600  . metoprolol succinate  50 mg Oral BID  . midodrine  2.5 mg Oral TID WC  . multivitamin with minerals  1 tablet Oral Daily  . pantoprazole  40 mg Oral Daily  . potassium chloride  10 mEq Oral Daily  . potassium chloride  40 mEq Oral Once  . Ensure Max Protein  11 oz Oral BID  . sodium chloride flush  3 mL Intravenous Q12H   Continuous Infusions: . sodium chloride       LOS: 2 days    Time spent: 30 mins     Silvano BilisNoah B Colleen Donahoe, MD [facility]  If 7PM-7AM, please contact night-coverage 04/11/2020, 11:27 AM

## 2020-04-11 NOTE — Plan of Care (Signed)
  Problem: Education: Goal: Knowledge of General Education information will improve Description: Including pain rating scale, medication(s)/side effects and non-pharmacologic comfort measures Outcome: Progressing   Problem: Health Behavior/Discharge Planning: Goal: Ability to manage health-related needs will improve Outcome: Progressing   Problem: Clinical Measurements: Goal: Ability to maintain clinical measurements within normal limits will improve Outcome: Progressing Goal: Will remain free from infection Outcome: Progressing Goal: Diagnostic test results will improve Outcome: Progressing Goal: Respiratory complications will improve Outcome: Progressing Goal: Cardiovascular complication will be avoided Outcome: Progressing   Problem: Activity: Goal: Risk for activity intolerance will decrease Outcome: Progressing   Problem: Education: Goal: Ability to demonstrate management of disease process will improve Outcome: Progressing Goal: Ability to verbalize understanding of medication therapies will improve Outcome: Progressing Goal: Individualized Educational Video(s) Outcome: Progressing   Problem: Activity: Goal: Capacity to carry out activities will improve Outcome: Progressing   Problem: Cardiac: Goal: Ability to achieve and maintain adequate cardiopulmonary perfusion will improve Outcome: Progressing   

## 2020-04-12 DIAGNOSIS — I5031 Acute diastolic (congestive) heart failure: Secondary | ICD-10-CM | POA: Diagnosis not present

## 2020-04-12 LAB — BASIC METABOLIC PANEL
Anion gap: 10 (ref 5–15)
BUN: 13 mg/dL (ref 8–23)
CO2: 27 mmol/L (ref 22–32)
Calcium: 8.3 mg/dL — ABNORMAL LOW (ref 8.9–10.3)
Chloride: 93 mmol/L — ABNORMAL LOW (ref 98–111)
Creatinine, Ser: 0.63 mg/dL (ref 0.44–1.00)
GFR, Estimated: >60 mL/min (ref >60)
Glucose, Bld: 95 mg/dL (ref 70–99)
Potassium: 3.9 mmol/L (ref 3.5–5.1)
Sodium: 130 mmol/L — ABNORMAL LOW (ref 135–145)

## 2020-04-12 MED ORDER — FUROSEMIDE 40 MG PO TABS
40.0000 mg | ORAL_TABLET | Freq: Every day | ORAL | Status: DC
  Administered 2020-04-13: 40 mg via ORAL
  Filled 2020-04-12: qty 1

## 2020-04-12 NOTE — Progress Notes (Signed)
Mobility Specialist - Progress Note   04/12/20 1150  Mobility  Activity Ambulated in room  Level of Assistance Minimal assist, patient does 75% or more  Assistive Device Front wheel walker  Distance Ambulated (ft) 40 ft  Mobility Response Tolerated well  Mobility performed by Mobility specialist  $Mobility charge 1 Mobility    Pre-mobility: 79 HR, 98% SpO2 During mobility: 126 HR, 104/78 BP, 98% SpO2 Post-mobility: 97 HR, 96% SpO2   Pt was lying in bed upon arrival on room air. Pt agreed to session. Pt denied any pain, nausea, or fatigue. Pt required modA getting EOB with noticeable posterior lean upon sitting. CGA donned. Pt stood to RW with minA and ambulated 30' before c/o SOB and fatigue. Pt denied weakness in LE and dizziness. An extended seated rest break was taken. VC needed for sequencing in safe sitting. NT entered to get vitals. Pt began c/o dizziness while seated, BP read 104/78. Dizziness resolved on its own, and pt was able to ambulate an additional 10' back to bed. Pt needed VC to keep RW close to body. At EOB, pt needs modA to return to supine position with support on LE. Pt c/o SOB throughout activity, O2 > 95% throughout. Overall, pt tolerated session well. Pt was left in bed with all needs in reach and alarm set.    [name] Mobility Specialist 04/12/20, 12:00 PM

## 2020-04-12 NOTE — Progress Notes (Signed)
Occupational Therapy Treatment Patient Details Name: [name] MRN: [id number] DOB: 11-18-1945 Today's Date: 04/12/2020    History of present illness [name] is a 74yoF who [facility] from [facility] with increased SOB adn CP. PMH: dCHF, HTN, IBS, CVA, seizures, AF on Eliquis- pt uses O2 at night. 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 Augmentin.  She was admitted here severe sepsis on 10/28 and aggressively hydrated.   OT comments  Pt seen for OT tx this date. Pt politely declines OOB after recently ambulating with staff (mobility tech) recently. Pt endorses feeling anxious recently. OT facilitates education re: stress/anxiety mgt, pursed lip breathing, and falls prevention strategies; pt verbalized understanding. Pt continues to benefit from skilled OT services; continue to recommend SNF At this time.    Follow Up Recommendations  SNF    Equipment Recommendations  Other (comment) (defer to next venue of care)    Recommendations for Other Services      Precautions / Restrictions Precautions Precautions: Fall Precaution Comments: R quadrant ostomy; laparotomy abdominal wound Restrictions Weight Bearing Restrictions: No       Mobility Bed Mobility               General bed mobility comments: pt declines 2/2 fatigue after recently walking around unit with staff  Transfers                      Balance                                           ADL either performed or assessed with clinical judgement   ADL                                               Vision Baseline Vision/History: Wears glasses Wears Glasses: At all times Patient Visual Report: No change from baseline     Perception     Praxis      Cognition Arousal/Alertness: Awake/alert Behavior  During Therapy: WFL for tasks assessed/performed Overall Cognitive Status: Within Functional Limits for tasks assessed                                          Exercises Other Exercises Other Exercises: OT facilitates education re: stress/anxiety mgt, pursed lip breathing, and falls prevention strategies; pt verbalized understanding   Shoulder Instructions       General Comments      Pertinent Vitals/ Pain       Pain Assessment: No/denies pain  Home Living                                          Prior Functioning/Environment              Frequency  Min 1X/week        Progress Toward Goals  OT Goals(current goals can now be found in the care plan section)  Progress towards OT goals: Progressing toward goals  Acute Rehab OT Goals Patient Stated Goal: to complete rehab and return home  OT Goal Formulation: With patient Time For Goal Achievement: 04/24/20 Potential to Achieve Goals: Good  Plan Discharge plan remains appropriate;Frequency remains appropriate    Co-evaluation                 AM-PAC OT "6 Clicks" Daily Activity     Outcome Measure   Help from another person eating meals?: None Help from another person taking care of personal grooming?: A Little Help from another person toileting, which includes using toliet, bedpan, or urinal?: A Little Help from another person bathing (including washing, rinsing, drying)?: A Lot Help from another person to put on and taking off regular upper body clothing?: A Little Help from another person to put on and taking off regular lower body clothing?: A Lot 6 Click Score: 17    End of Session    OT Visit Diagnosis: Unsteadiness on feet (R26.81);Muscle weakness (generalized) (M62.81)   Activity Tolerance Patient tolerated treatment well   Patient Left in bed;with call bell/phone within reach;with bed alarm set   Nurse Communication          Time: 1356-1410 OT Time  Calculation (min): 14 min  Charges: OT General Charges $OT Visit: 1 Visit OT Treatments $Self Care/Home Management : 8-22 mins  Richrd Prime, MPH, MS, OTR/L ascom [phone] 04/12/20, 2:16 PM

## 2020-04-12 NOTE — Progress Notes (Signed)
[facility] Cardiology  SUBJECTIVE: Patient laying in bed, reports episode of shortness of breath last evening which may have been anxiety provoked   Vitals:   04/11/20 2019 04/12/20 0122 04/12/20 0528 04/12/20 0755  BP: 105/68 97/79 97/70  100/67  Pulse: 97 84 92 96  Resp: 16  18 16   Temp: 98.6 F (37 C) 97.8 F (36.6 C) 97.6 F (36.4 C) 97.9 F (36.6 C)  TempSrc: Oral Oral Oral   SpO2: 97% 96% 97% 99%  Weight:      Height:         Intake/Output Summary (Last 24 hours) at 04/12/2020 0803 Last data filed at 04/11/2020 1940 Gross per 24 hour  Intake --  Output 2900 ml  Net -2900 ml      PHYSICAL EXAM  General: Well developed, well nourished, in no acute distress HEENT:  Normocephalic and atramatic Neck:  No JVD.  Lungs: Clear bilaterally to auscultation and percussion. Heart: HRRR . Normal S1 and S2 without gallops or murmurs.  Abdomen: Bowel sounds are positive, abdomen soft and non-tender  Msk:  Back normal, normal gait. Normal strength and tone for age. Extremities: No clubbing, cyanosis or edema.   Neuro: Alert and oriented X 3. Psych:  Good affect, responds appropriately   LABS: Basic Metabolic Panel: Recent Labs    04/11/20 0546 04/12/20 0515  NA 130* 130*  K 3.6 3.9  CL 94* 93*  CO2 29 27  GLUCOSE 99 95  BUN 10 13  CREATININE 0.64 0.63  CALCIUM 8.4* 8.3*   Liver Function Tests: No results for input(s): AST, ALT, ALKPHOS, BILITOT, PROT, ALBUMIN in the last 72 hours. No results for input(s): LIPASE, AMYLASE in the last 72 hours. CBC: Recent Labs    04/10/20 0424 04/11/20 0546  WBC 14.0* 11.9*  HGB 10.2* 11.0*  HCT 32.7* 33.7*  MCV 83.2 81.4  PLT 271 305   Cardiac Enzymes: No results for input(s): CKTOTAL, CKMB, CKMBINDEX, TROPONINI in the last 72 hours. BNP: Invalid input(s): POCBNP D-Dimer: No results for input(s): DDIMER in the last 72 hours. Hemoglobin A1C: No results for input(s): HGBA1C in the last 72 hours. Fasting Lipid Panel: No  results for input(s): CHOL, HDL, LDLCALC, TRIG, CHOLHDL, LDLDIRECT in the last 72 hours. Thyroid Function Tests: No results for input(s): TSH, T4TOTAL, T3FREE, THYROIDAB in the last 72 hours.  Invalid input(s): FREET3 Anemia Panel: No results for input(s): VITAMINB12, FOLATE, FERRITIN, TIBC, IRON, RETICCTPCT in the last 72 hours.  DG Chest 2 View  Result Date: 04/11/2020 CLINICAL DATA:  74 year old female with a history congestive heart failure EXAM: CHEST - 2 VIEW COMPARISON:  04/09/2020, CT chest 03/28/2020 FINDINGS: Cardiomediastinal silhouette unchanged with the heart borders partially obscured by overlying lung and pleural disease. Opacity at the bilateral lung bases partially obscuring the hemidiaphragm and heart borders. No pneumothorax. Opacity at the posterior lung base on the lateral view. Mild interlobular septal thickening. IMPRESSION: Bilateral pleural effusions with associated atelectasis/consolidation. Electronically Signed   By: 03/30/2020 D.O.   On: 04/11/2020 08:10     Echo LVEF 55 to 60%  TELEMETRY: Atrial fibrillation at 90 bpm:  ASSESSMENT AND PLAN:  Active Problems:   Acute CHF (congestive heart failure) (HCC)    1. 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 Chest xray  04/11/2020 revealed bilateral pleural effusions.  Troponin  normalx 2. BNP 1,381.  Oxygen saturation 97% on 2 L O2 by [place]. 2. Paroxysmal atrial fibrillation, on Eliquis for stroke prevention, and Cardizem CD and metoprolol succinate for rate control, and amiodarone 200 mg daily for rhythm control. Heart rate 80 bpm. 3. Essential hypertension, well controlled 4. Mitral regurgitation,2D echocardiogram on 12/06/2019 revealed normal left ventricular function with LVEF 55 to 60%  with moderate mitral valve prolapse, mild mitral regurgitation with mildly elevated pulmonary artery systolic pressure. 5. History of stroke 6. Pulmonary hypertension 7. Hypothyroidism, TSH 7.474 (up from 1.828 three weeks ago). Her dose of levothyroxine was increased.  Recommendations  1.  Agree with current therapy 2.  Continue diuresis, transition to furosemide 40 mg daily prior to discharge 3.  Carefully monitor renal status 4.  Continue Eliquis for stroke prevention 5.  Continue Cardizem CD, metoprolol succinate and amiodarone for rate control 6.  Likely discharge in a.m. 7.  Follow-up with me in 1 to 2 weeks   Marcina Millard, MD, PhD, [facility] 04/12/2020 8:03 AM

## 2020-04-12 NOTE — Progress Notes (Addendum)
PROGRESS NOTE    [name]  XAJ:[id number] DOB: April 25, 1946 DOA: 04/08/2020 PCP: Lauren Fuchs, MD   74 year old female referred for evaluation of acute on chronic diastolic congestive heart failure. The patient has a history of paroxsymal atrial fibrillation on Eliquis, moderate mitral valve prolapse with mild regurgitation, essential hypertension, history of stroke, and pulmonary hypertension. 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 During the admission for sepsis, she was aggressively hydrated. The patient presented to [facility] 04/08/2020 from [facility] where she currently resides for rehabilitation, for a few day history of worsening shortness of breath, peripheral edema, orthopnea, and palpitations with tachycardia. She wears supplemental oxygen at night. She was hypoxic with oxygen saturations in the upper 80s, and was placed on 2L O2. ECG revealed atrial fibrillation at a rate of 103 bpm without acute ST wave abnormalities. Chest xray showed cardiomegaly with moderate to large bilateral pleural effusion, right greater than left. BNP elevated to 1381, troponin normal, WBC 14.1, TSH 7.474 (up from 1.828 three weeks ago).  Her dose of levothyroxine was increased. The patient was started on IV Lasix. At this time, she denies palpitations or chest pain.   Assessment & Plan:   Active Problems:   Acute CHF (congestive heart failure) (HCC)   # Aacute on chronic diastolic CHF: improving. Out 2.90 liters yesterday, kidney function and electrolytes stable. Likely 2/2 aggressive hydration during recent hospitalization. - transition to oral lasix 40 qd - Strict I/Os and daily weights. Fluid restriction. - likely d/c tomorrow   # History diverticulitis s/p colectomy. Recent hospitalization for infection at surgical site, now resolved.  Ostomy output appears normal - will need outpt f/u w/ Dr. Claudine Hood  PAF: rate controlled.  - continue on amiodarone, cardizem, metoprolol & eliquis   HTN: controlled - continue on metoprolol, cardizem & amiodarone   Hypothyroidism:  - continue on home dose of levothyroxine   GERD:  - continue on pantoprazole    DVT prophylaxis: eliquis  Code Status: full Family Communication: updated son telephonically 11/11 Disposition Plan: back to SNF (bed is available)  Status is: Inpatient  Remains inpatient appropriate because:Ongoing diagnostic testing needed not appropriate for outpatient work up and IV treatments appropriate due to intensity of illness or inability to take PO   Dispo: The patient is from: SNF              Anticipated d/c is to: SNF              Anticipated d/c date is: 1 days              Patient currently is not medically stable to d/c.         Consultants:   Cardio    Procedures:   Antimicrobials:    Subjective: Pt feeling well. No chest pain. One episode of anxiety last night resolved with anxiolytic. Today no dyspnea or cough, has appetite.  Objective: Vitals:   04/11/20 2019 04/12/20 0122 04/12/20 0528 04/12/20 0755  BP: 105/68 97/79 97/70  100/67  Pulse: 97 84 92 96  Resp: 16  18 16   Temp: 98.6 F (37 C) 97.8 F (36.6 C) 97.6 F (36.4 C) 97.9 F (36.6 C)  TempSrc: Oral Oral Oral   SpO2: 97% 96% 97% 99%  Weight:      Height:        Intake/Output Summary (Last 24 hours)  at 04/12/2020 1031 Last data filed at 04/12/2020 1000 Gross per 24 hour  Intake 240 ml  Output 1850 ml  Net -1610 ml   Filed Weights   04/09/20 0320 04/10/20 0413 04/11/20 0446  Weight: 67.4 kg 67.9 kg 66.2 kg    Examination:  General exam: Appears calm & comfortable  Respiratory system: decreased breath sounds b/l with faint rales Cardiovascular system: Irregularly irregular.  Harsh holosystolic murmur Gastrointestinal system: Abdomen is nondistended,  soft and nontender. Ostomy bag noted. Hypoactive bowel sounds heard. C/d/i around ostomy bag Central nervous system: Alert and oriented. Moves all 4 extremities  Psychiatry: Judgement and insight appear normal. Normal mood and affect     Data Reviewed: I have personally reviewed following labs and imaging studies  CBC: Recent Labs  Lab 04/08/20 2350 04/09/20 0419 04/10/20 0424 04/11/20 0546  WBC 14.1* 13.2* 14.0* 11.9*  NEUTROABS 11.5* 11.0*  --   --   HGB 10.8* 11.1* 10.2* 11.0*  HCT 33.6* 34.6* 32.7* 33.7*  MCV 82.2 82.4 83.2 81.4  PLT 313 311 271 305   Basic Metabolic Panel: Recent Labs  Lab 04/08/20 2350 04/09/20 0419 04/10/20 0424 04/11/20 0546 04/12/20 0515  NA 129*  --  132* 130* 130*  K 4.8  --  3.7 3.6 3.9  CL 97*  --  97* 94* 93*  CO2 22  --  27 29 27   GLUCOSE 117*  --  102* 99 95  BUN 11  --  12 10 13   CREATININE 0.81  --  0.86 0.64 0.63  CALCIUM 8.3*  --  8.4* 8.4* 8.3*  MG  --  2.0  --   --   --    GFR: Estimated Creatinine Clearance: 53.8 mL/min (by C-G formula based on SCr of 0.63 mg/dL). Liver Function Tests: Recent Labs  Lab 04/08/20 2350  AST 26  ALT 34  ALKPHOS 107  BILITOT 0.8  PROT 6.3*  ALBUMIN 2.7*   No results for input(s): LIPASE, AMYLASE in the last 168 hours. No results for input(s): AMMONIA in the last 168 hours. Coagulation Profile: No results for input(s): INR, PROTIME in the last 168 hours. Cardiac Enzymes: No results for input(s): CKTOTAL, CKMB, CKMBINDEX, TROPONINI in the last 168 hours. BNP (last 3 results) No results for input(s): PROBNP in the last 8760 hours. HbA1C: No results for input(s): HGBA1C in the last 72 hours. CBG: No results for input(s): GLUCAP in the last 168 hours. Lipid Profile: No results for input(s): CHOL, HDL, LDLCALC, TRIG, CHOLHDL, LDLDIRECT in the last 72 hours. Thyroid Function Tests: No results for input(s): TSH, T4TOTAL, FREET4, T3FREE, THYROIDAB in the last 72 hours. Anemia Panel: No  results for input(s): VITAMINB12, FOLATE, FERRITIN, TIBC, IRON, RETICCTPCT in the last 72 hours. Sepsis Labs: No results for input(s): PROCALCITON, LATICACIDVEN in the last 168 hours.  Recent Results (from the past 240 hour(s))  Respiratory Panel by RT PCR (Flu A&B, Covid) - Nasopharyngeal Swab     Status: None   Collection Time: 04/02/20  1:29 PM   Specimen: Nasopharyngeal Swab  Result Value Ref Range Status   SARS Coronavirus 2 by RT PCR NEGATIVE NEGATIVE Final    Comment: (NOTE) SARS-CoV-2 target nucleic acids are NOT DETECTED.  The SARS-CoV-2 RNA is generally detectable in upper respiratoy specimens during the acute phase of infection. The lowest concentration of SARS-CoV-2 viral copies this assay can detect is 131 copies/mL. A negative result does not preclude SARS-Cov-2 infection and should not be used as the  sole basis for treatment or other patient management decisions. A negative result may occur with  improper specimen collection/handling, submission of specimen other than nasopharyngeal swab, presence of viral mutation(s) within the areas targeted by this assay, and inadequate number of viral copies (<131 copies/mL). A negative result must be combined with clinical observations, patient history, and epidemiological information. The expected result is Negative.  Fact Sheet for Patients:  https://www.moore.com/  Fact Sheet for Healthcare Providers:  https://www.young.biz/  This test is no t yet approved or cleared by the [place] FDA and  has been authorized for detection and/or diagnosis of SARS-CoV-2 by FDA under an Emergency Use Authorization (EUA). This EUA will remain  in effect (meaning this test can be used) for the duration of the COVID-19 declaration under Section 564(b)(1) of the Act, 21 U.S.C. section 360bbb-3(b)(1), unless the authorization is terminated or revoked sooner.     Influenza A by PCR NEGATIVE NEGATIVE  Final   Influenza B by PCR NEGATIVE NEGATIVE Final    Comment: (NOTE) The Xpert Xpress SARS-CoV-2/FLU/RSV assay is intended as an aid in  the diagnosis of influenza from Nasopharyngeal swab specimens and  should not be used as a sole basis for treatment. Nasal washings and  aspirates are unacceptable for Xpert Xpress SARS-CoV-2/FLU/RSV  testing.  Fact Sheet for Patients: https://www.moore.com/  Fact Sheet for Healthcare Providers: https://www.young.biz/  This test is not yet approved or cleared by the [place] FDA and  has been authorized for detection and/or diagnosis of SARS-CoV-2 by  FDA under an Emergency Use Authorization (EUA). This EUA will remain  in effect (meaning this test can be used) for the duration of the  Covid-19 declaration under Section 564(b)(1) of the Act, 21  U.S.C. section 360bbb-3(b)(1), unless the authorization is  terminated or revoked. Performed at [facility], [address] [place], [place] [address]   Resp Panel by RT PCR (RSV, Flu A&B, Covid) - Nasopharyngeal Swab     Status: None   Collection Time: 04/09/20  1:02 AM   Specimen: Nasopharyngeal Swab  Result Value Ref Range Status   SARS Coronavirus 2 by RT PCR NEGATIVE NEGATIVE Final    Comment: (NOTE) SARS-CoV-2 target nucleic acids are NOT DETECTED.  The SARS-CoV-2 RNA is generally detectable in upper respiratoy specimens during the acute phase of infection. The lowest concentration of SARS-CoV-2 viral copies this assay can detect is 131 copies/mL. A negative result does not preclude SARS-Cov-2 infection and should not be used as the sole basis for treatment or other patient management decisions. A negative result may occur with  improper specimen collection/handling, submission of specimen other than nasopharyngeal swab, presence of viral mutation(s) within the areas targeted by this assay, and inadequate number of viral copies (<131  copies/mL). A negative result must be combined with clinical observations, patient history, and epidemiological information. The expected result is Negative.  Fact Sheet for Patients:  https://www.moore.com/  Fact Sheet for Healthcare Providers:  https://www.young.biz/  This test is no t yet approved or cleared by the [place] FDA and  has been authorized for detection and/or diagnosis of SARS-CoV-2 by FDA under an Emergency Use Authorization (EUA). This EUA will remain  in effect (meaning this test can be used) for the duration of the COVID-19 declaration under Section 564(b)(1) of the Act, 21 U.S.C. section 360bbb-3(b)(1), unless the authorization is terminated or revoked sooner.     Influenza A by PCR NEGATIVE NEGATIVE Final   Influenza B by PCR NEGATIVE NEGATIVE  Final    Comment: (NOTE) The Xpert Xpress SARS-CoV-2/FLU/RSV assay is intended as an aid in  the diagnosis of influenza from Nasopharyngeal swab specimens and  should not be used as a sole basis for treatment. Nasal washings and  aspirates are unacceptable for Xpert Xpress SARS-CoV-2/FLU/RSV  testing.  Fact Sheet for Patients: https://www.moore.com/https://www.fda.gov/media/142436/download  Fact Sheet for Healthcare Providers: https://www.young.biz/https://www.fda.gov/media/142435/download  This test is not yet approved or cleared by the [place] FDA and  has been authorized for detection and/or diagnosis of SARS-CoV-2 by  FDA under an Emergency Use Authorization (EUA). This EUA will remain  in effect (meaning this test can be used) for the duration of the  Covid-19 declaration under Section 564(b)(1) of the Act, 21  U.S.C. section 360bbb-3(b)(1), unless the authorization is  terminated or revoked.    Respiratory Syncytial Virus by PCR NEGATIVE NEGATIVE Final    Comment: (NOTE) Fact Sheet for Patients: https://www.moore.com/https://www.fda.gov/media/142436/download  Fact Sheet for Healthcare  Providers: https://www.young.biz/https://www.fda.gov/media/142435/download  This test is not yet approved or cleared by the [place] FDA and  has been authorized for detection and/or diagnosis of SARS-CoV-2 by  FDA under an Emergency Use Authorization (EUA). This EUA will remain  in effect (meaning this test can be used) for the duration of the  COVID-19 declaration under Section 564(b)(1) of the Act, 21 U.S.C.  section 360bbb-3(b)(1), unless the authorization is terminated or  revoked. Performed at [facility], [address] [place], [place] [address]          Radiology Studies: DG Chest 2 View  Result Date: 04/11/2020 CLINICAL DATA:  74 year old female with a history congestive heart failure EXAM: CHEST - 2 VIEW COMPARISON:  04/09/2020, CT chest 03/28/2020 FINDINGS: Cardiomediastinal silhouette unchanged with the heart borders partially obscured by overlying lung and pleural disease. Opacity at the bilateral lung bases partially obscuring the hemidiaphragm and heart borders. No pneumothorax. Opacity at the posterior lung base on the lateral view. Mild interlobular septal thickening. IMPRESSION: Bilateral pleural effusions with associated atelectasis/consolidation. Electronically Signed   By: Gilmer MorJaime  Wagner D.O.   On: 04/11/2020 08:10        Scheduled Meds: . amiodarone  200 mg Oral Daily  . apixaban  5 mg Oral BID  . diltiazem  120 mg Oral Daily  . folic acid  1 mg Oral Daily  . [START ON 04/13/2020] furosemide  40 mg Oral Daily  . hydrocerin  1 application Topical BID  . levothyroxine  75 mcg Oral Q0600  . metoprolol succinate  50 mg Oral BID  . midodrine  2.5 mg Oral TID WC  . multivitamin with minerals  1 tablet Oral Daily  . pantoprazole  40 mg Oral Daily  . potassium chloride  10 mEq Oral Daily  . Ensure Max Protein  11 oz Oral BID  . sodium chloride flush  3 mL Intravenous Q12H   Continuous Infusions: . sodium chloride       LOS: 3 days    Time spent: 30 mins      Silvano BilisNoah B Savreen Gebhardt, MD [facility]  If 7PM-7AM, please contact night-coverage 04/12/2020, 10:31 AM

## 2020-04-12 NOTE — Progress Notes (Signed)
° °  Heart Failure Nurse  Navigator Note   HFpEF 55-60%   She presented to the ED with complaints of SOB.  CXR revealed cardiomegaly with moderate to large pleural effusions, right greater than left.  Co Morbidities:  Hypertension Atrial Fibrillation Anemia  Medications:  Eliquis 5 mg BID ASA 81 mg daily diltiazem 120 mg daily Metoprolol succinate 50 mg BID Midodrine 5 mg with meals Potassium 10 meq daily  Labs:  Sodium 130, potassium 3.9, BUN 13, creatinine 0.63   BMI 24.28 BP 100/67    Assessment:  General she is awake and alert.  HEENT- pupils equal, No JVD  Cardiac- heart tones regular  Chest- breath sounds clear  Abdomen- soft, non tender  Musculoskeletal- no lower extremity edema  Psych is pleasant and appropriate  Neuro- speech clear, moves all extremities   Again reviewed fluid restriction of 8-8 ounce cups of liquid daily.  She reviewed heart failure videos- what is heart failure, heart failure symptoms, when to call your doctor and sodium restriction.  She discussed the zone magnet that she has on her frig, also talked about her morning routine, weighing herself, recording and taking blood pressure.      Tresa Endo RN, CHFN

## 2020-04-12 NOTE — Care Management Important Message (Signed)
Important Message  Patient Details  Name: [name] MRN: [id number] Date of Birth: 10-14-1945   Medicare Important Message Given:  Yes     [name] 04/12/2020, 12:36 PM

## 2020-04-12 NOTE — Progress Notes (Signed)
Physical Therapy Treatment Patient Details Name: [name] MRN: [id number] DOB: Jul 15, 1945 Today's Date: 04/12/2020    History of Present Illness [name] is a 74yoF who comes to [facility] on 11/7 from [facility] with increased SOB adn CP. PMH: dCHF, HTN, IBS, CVA, seizures, AF on Eliquis- pt uses O2 at night. 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 Augmentin.  She was admitted here severe sepsis on 10/28 and aggressively hydrated.    PT Comments    Pt in bed upon entry, resting, agreeable to participate. ModA to EOB when extent of linen/gown soiling is discovered- RN made aware. Training on technique for STS tranfers with RW. Pt requires modA from standard surface or minGuard assist from 30% elevated surface, high velocity trunk thrust, heavy UE push off RW. Pt tolerates upright standing well, no LOB whilst up. NA enters to assist with linen change and pericare.   Follow Up Recommendations  SNF;Supervision - Intermittent     Equipment Recommendations  None recommended by PT    Recommendations for Other Services OT consult     Precautions / Restrictions Precautions Precautions: Fall Precaution Comments: R quadrant ostomy; laparotomy abdominal wound Restrictions Weight Bearing Restrictions: No    Mobility  Bed Mobility Overal bed mobility: Needs Assistance Bed Mobility: Supine to Sit     Supine to sit: Mod assist     General bed mobility comments: pt declines 2/2 fatigue after recently walking around unit with staff  Transfers Overall transfer level: Needs assistance Equipment used: Rolling walker (2 wheeled) Transfers: Sit to/from Stand Sit to Stand: Min guard;From elevated surface         General transfer comment: continues to require heavy UE on RW and high velocity trunk thrust.  Ambulation/Gait                  Stairs             Wheelchair Mobility    Modified Rankin (Stroke Patients Only)       Balance                                            Cognition Arousal/Alertness: Awake/alert Behavior During Therapy: WFL for tasks assessed/performed Overall Cognitive Status: Within Functional Limits for tasks assessed                                        Exercises Other Exercises Other Exercises: OT facilitates education re: stress/anxiety mgt, pursed lip breathing, and falls prevention strategies; pt verbalized understanding Other Exercises: STS from elevated EOB x10; standing 10-15sec each; cues for technique throughout.    General Comments        Pertinent Vitals/Pain Pain Assessment: No/denies pain    Home Living                      Prior Function            PT Goals (current goals can now be found in the care plan section) Acute Rehab PT Goals Patient Stated Goal: to complete rehab and return home  PT Goal Formulation: With patient Time For Goal Achievement: 04/23/20 Potential to Achieve Goals:  Fair Progress towards PT goals: Not progressing toward goals - comment    Frequency    Min 2X/week      PT Plan Current plan remains appropriate    Co-evaluation              AM-PAC PT "6 Clicks" Mobility   Outcome Measure  Help needed turning from your back to your side while in a flat bed without using bedrails?: A Lot Help needed moving from lying on your back to sitting on the side of a flat bed without using bedrails?: A Lot Help needed moving to and from a bed to a chair (including a wheelchair)?: A Lot Help needed standing up from a chair using your arms (e.g., wheelchair or bedside chair)?: A Lot Help needed to walk in hospital room?: A Little Help needed climbing 3-5 steps with a railing? : A Little 6 Click Score: 14    End of Session Equipment Utilized During Treatment: Gait belt Activity  Tolerance: Patient tolerated treatment well;No increased pain Patient left: in bed;with call bell/phone within reach;with bed alarm set Nurse Communication: Mobility status PT Visit Diagnosis: Unsteadiness on feet (R26.81);Muscle weakness (generalized) (M62.81)     Time: 1093-2355 PT Time Calculation (min) (ACUTE ONLY): 12 min  Charges:  $Therapeutic Exercise: 8-22 mins                     3:04 PM, 04/12/20 Rosamaria Lints, PT, DPT Physical Therapist - [facility]  [phone] (ASCOM)   Saidee Geremia C 04/12/2020, 3:01 PM

## 2020-04-13 DIAGNOSIS — I5031 Acute diastolic (congestive) heart failure: Secondary | ICD-10-CM | POA: Diagnosis not present

## 2020-04-13 LAB — BASIC METABOLIC PANEL
Anion gap: 10 (ref 5–15)
BUN: 14 mg/dL (ref 8–23)
CO2: 25 mmol/L (ref 22–32)
Calcium: 8.6 mg/dL — ABNORMAL LOW (ref 8.9–10.3)
Chloride: 91 mmol/L — ABNORMAL LOW (ref 98–111)
Creatinine, Ser: 0.76 mg/dL (ref 0.44–1.00)
GFR, Estimated: >60 mL/min (ref >60)
Glucose, Bld: 99 mg/dL (ref 70–99)
Potassium: 4.2 mmol/L (ref 3.5–5.1)
Sodium: 126 mmol/L — ABNORMAL LOW (ref 135–145)

## 2020-04-13 LAB — RESPIRATORY PANEL BY RT PCR (FLU A&B, COVID)
Influenza A by PCR: NEGATIVE
Influenza B by PCR: NEGATIVE
SARS Coronavirus 2 by RT PCR: NEGATIVE

## 2020-04-13 MED ORDER — FUROSEMIDE 40 MG PO TABS
40.0000 mg | ORAL_TABLET | Freq: Every day | ORAL | 1 refills | Status: DC
Start: 2020-04-14 — End: 2020-04-24

## 2020-04-13 MED ORDER — LEVOTHYROXINE SODIUM 75 MCG PO TABS: 75.0000 ug | ORAL_TABLET | Freq: Every day | ORAL | Status: DC

## 2020-04-13 NOTE — Progress Notes (Signed)
Occupational Therapy Treatment Patient Details Name: JATAYA WANN MRN: [id number] DOB: 03/30/1946 Today's Date: 04/13/2020    History of present illness Emanie Behan is a 74yoF who comes to [facility] on 11/7 from [facility] with increased SOB adn CP. PMH: dCHF, HTN, IBS, CVA, seizures, AF on Eliquis- pt uses O2 at night. 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 Augmentin.  She was admitted here severe sepsis on 10/28 and aggressively hydrated.   OT comments  Pt seen for OT tx session focused on toileting. Pt required Min A for bed mobility and completed ADL transfers from EOB and BSC with CGA, PRN cues for hand placement/RW mgt. Pt completed toileting hygiene with CGA in standing and set up for wipes. Pt able to stand without UE support to wash hands at sink while standing inside the RW frame. Pt tolerated session well. Denied pain. Pt eager to go to rehab and work hard to return home.    Follow Up Recommendations  SNF    Equipment Recommendations  Other (comment) (defer to next venue of acre)    Recommendations for Other Services      Precautions / Restrictions Precautions Precautions: Fall Precaution Comments: R quadrant ostomy; laparotomy abdominal wound Restrictions Weight Bearing Restrictions: No       Mobility Bed Mobility Overal bed mobility: Needs Assistance Bed Mobility: Supine to Sit;Sit to Supine     Supine to sit: Min assist Sit to supine: Min assist      Transfers Overall transfer level: Needs assistance Equipment used: Rolling walker (2 wheeled) Transfers: Sit to/from Stand Sit to Stand: From elevated surface;Min guard              Balance Overall balance assessment: Needs assistance Sitting-balance support: No upper extremity supported Sitting balance-Leahy Scale: Good     Standing balance support: During  functional activity;No upper extremity supported Standing balance-Leahy Scale: Fair Standing balance comment: pt able to stand at sink with no UE support to wash hands while standing within RW frame                           ADL either performed or assessed with clinical judgement   ADL Overall ADL's : Needs assistance/impaired     Grooming: Standing;Wash/dry hands;Min guard                   Toilet Transfer: Min guard;Minimal assistance;BSC;Ambulation;RW   Toileting- Clothing Manipulation and Hygiene: Set up;Min guard;Sit to/from stand       Functional mobility during ADLs: Health and safety inspector     Praxis      Cognition Arousal/Alertness: Awake/alert Behavior During Therapy: WFL for tasks assessed/performed Overall Cognitive Status: Within Functional Limits for tasks assessed                                          Exercises Other Exercises Other Exercises: BSC toileting and washed hands at sink; CGA and set up   Shoulder Instructions       General Comments      Pertinent Vitals/ Pain       Pain Assessment: No/denies pain  Home Living  Prior Functioning/Environment              Frequency  Min 1X/week        Progress Toward Goals  OT Goals(current goals can now be found in the care plan section)  Progress towards OT goals: Progressing toward goals  Acute Rehab OT Goals Patient Stated Goal: to complete rehab and return home  OT Goal Formulation: With patient Time For Goal Achievement: 04/24/20 Potential to Achieve Goals: Good  Plan Discharge plan remains appropriate;Frequency remains appropriate    Co-evaluation                 AM-PAC OT "6 Clicks" Daily Activity     Outcome Measure   Help from another person eating meals?: None Help from another person taking care of personal grooming?: A  Little Help from another person toileting, which includes using toliet, bedpan, or urinal?: A Little Help from another person bathing (including washing, rinsing, drying)?: A Lot Help from another person to put on and taking off regular upper body clothing?: A Little Help from another person to put on and taking off regular lower body clothing?: A Lot 6 Click Score: 17    End of Session Equipment Utilized During Treatment: Gait belt;Rolling walker  OT Visit Diagnosis: Unsteadiness on feet (R26.81);Muscle weakness (generalized) (M62.81)   Activity Tolerance Patient tolerated treatment well   Patient Left in bed;with call bell/phone within reach;with bed alarm set   Nurse Communication          Time: 6962-9528 OT Time Calculation (min): 25 min  Charges: OT General Charges $OT Visit: 1 Visit OT Treatments $Self Care/Home Management : 23-37 mins  Richrd Prime, MPH, MS, OTR/L ascom [phone] 04/13/20, 12:35 PM

## 2020-04-13 NOTE — Progress Notes (Signed)
[facility] Cardiology  Patient Description: Ms. 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   SUBJECTIVE: The patient reports to be doing well on today and she denies any dyspnea or chest pain at this time. She reports that she is ready for discharge and is looking forward to seeing her son.  OBJECTIVE: The patient appears euvolemic, she appears fairly well, she is sitting comfortably in bed watching television and she is in no apparent distress. The patient has normal effort of breathing and is on room air. Telemetry reveals atrial fibrillation with HR of 88 bpm and she remains asymptomatic. Patient is currently on room air and oxygen saturations are stable.   Vitals:   04/12/20 2002 04/13/20 0318 04/13/20 0719 04/13/20 1111  BP: 109/76 95/69 110/81 104/72  Pulse: (!) 106 86 88 92  Resp: '18 18 17 17  ' Temp: 97.8 F (36.6 C) 97.7 F (36.5 C) 97.9 F (36.6 C) (!) 97.4 F (36.3 C)  TempSrc: Oral Oral  Oral  SpO2: 98% 98% 98% 97%  Weight:  64 kg    Height:         Intake/Output Summary (Last 24 hours) at 04/13/2020 1340 Last data filed at 04/13/2020 1117 Gross per 24 hour  Intake 720 ml  Output 2150 ml  Net -1430 ml      PHYSICAL EXAM  General: Well developed, well nourished, in no acute distress HEENT:  Normocephalic and atramatic. PERRLA Neck:  No JVD. Supple. No rigidity.  Lungs: Clear bilaterally to auscultation. Chest expansion symmetrical. Normal effort of breathing.  Heart: irregular HRR . Normal S1 and S2 with Grade I murmur auscultated.  Abdomen: Bowel sounds are positive, abdomen soft and non-tender  Msk: Normal strength and tone for age. Extremities: No clubbing, cyanosis or edema.   Neuro: Alert and oriented X 3. Psych:   Good affect, responds appropriately   LABS: Basic Metabolic Panel: Recent Labs    04/12/20 0515 04/13/20 0501  NA 130* 126*  K 3.9 4.2  CL 93* 91*  CO2 27 25  GLUCOSE 95 99  BUN 13 14  CREATININE 0.63 0.76  CALCIUM 8.3* 8.6*   Liver Function Tests: No results for input(s): AST, ALT, ALKPHOS, BILITOT, PROT, ALBUMIN in the last 72 hours. No results for input(s): LIPASE, AMYLASE in the last 72 hours. CBC: Recent Labs    04/11/20 0546  WBC 11.9*  HGB 11.0*  HCT 33.7*  MCV 81.4  PLT 305   Cardiac Enzymes: No results for input(s): CKTOTAL, CKMB, CKMBINDEX, TROPONINI in the last 72 hours. BNP: Invalid input(s): POCBNP D-Dimer: No results for input(s): DDIMER in the last 72 hours. Hemoglobin A1C: No results for input(s): HGBA1C in the last 72 hours. Fasting Lipid Panel: No results for input(s): CHOL, HDL, LDLCALC, TRIG, CHOLHDL, LDLDIRECT in the last 72 hours. Thyroid Function Tests: No results for input(s): TSH, T4TOTAL, T3FREE, THYROIDAB in the last 72 hours.  Invalid input(s): FREET3 Anemia Panel: No results for input(s): VITAMINB12, FOLATE, FERRITIN, TIBC, IRON, RETICCTPCT in the last 72 hours.  No results found.   Echo: IMPRESSIONS 1. Left ventricular ejection fraction, by estimation, is 55 to 60%. The  left ventricle has normal function. The left ventricle has no regional  wall motion abnormalities. Left ventricular diastolic parameters were  normal.  2. Right ventricular systolic function is normal. The right ventricular  size is normal. There is mildly elevated pulmonary artery systolic  pressure.  3. Left atrial size was moderately dilated.  4. Right atrial size was mild to moderately dilated.  5. The mitral valve is degenerative. Mild mitral valve regurgitation.  6. The aortic valve was not well visualized. Aortic valve regurgitation  is trivial.   TELEMETRY: Atrial fibrillation with HR of 88 bpm   ASSESSMENT AND PLAN:  Active Problems:    Acute CHF (congestive heart failure) ([place])    1. Acute on chronic diastolic CHF likely secondary to recent aggressive hydration resulting in fluid volume overload, reasonably stable, patient appears euvolemic, BNP on admission was 1,381, CXR on 11/10 remarkable for bilateral pleural effusions  -Echo reveals LVEF estimated at 55-60%, normal LV diastolic parameters, mildly elevated pulmonary pressures and mild MR.  -Continue oral diuresis with lasix and continue supplemental potassium.   -Monitor CMP for kidney functioning and replace electrolytes per protocol.   - monitor Strict I&O's, daily weights, telemetry monitoring until discharged.   -Recommend following a low sodium diet.  -Recommend follow up with [facility] HF clinic as outpatient.   2. Paroxysmal atrial fibrillation, reasonably controlled, rate controlled  -Continue Cardizem CD and metoprolol succinate for rate control, amiodarone for rhythm management and anticoagulation with eliquis for risk reduction of CVA.   -Follow up with Dr. Sheppard Coil Paraschos/Anna Drane-PA-C within 10 days of discharge.   -The patient is cleared for discharge from a Cardiology standpoint.   3. MR likely due to mild MVP, confirmed by Echo, reasonably stable  -Recommend conservative therapy at this time.   4. HTN, stable  -Continue current medications, follwing DASH diet and monitoring bp q4h.   5. H/o stroke, stable  -Continue eliquis therapy and tight blood pressure control.   6. Pulmonary HTN, mild, stable  -continue conservative therapy at this time. Refer to Pulmonary if patient becomes symptomatic.   7. Hypothyroidism, stable  -continue current medications.      Aryahi Denzler, ACNPC-AG  04/13/2020 1:40 PM

## 2020-04-13 NOTE — Discharge Summary (Signed)
[name]:[id number] DOB: Nov 21, 1945 DOA: 04/08/2020  PCP: Mickel Fuchs, MD  Admit date: 04/08/2020 Discharge date: 04/13/2020  Time spent: 35 minutes  Recommendations for Outpatient Follow-up:  1. F/u with Dr. Darrold Junker (cardiology) in 1-2 weeks, need to call to schedule that appt 2. F/u with Dr. Claudine Mouton (general surgery) in 1-2 weeks, need to call to schedule that appt 3. Has heart failure clinic visit scheduled 11/17 @ 9:00 AM   Discharge Diagnoses:  Active Problems:   Acute CHF (congestive heart failure) (HCC)   Discharge Condition: good  Diet recommendation: heart healthy  Filed Weights   04/10/20 0413 04/11/20 0446 04/13/20 0318  Weight: 67.9 kg 66.2 kg 64 kg    History of present illness:  74 year old female referred for evaluation of acute on chronic diastolic congestive heart failure. The patient has a history of paroxsymal atrial fibrillation on Eliquis, moderate mitral valve prolapse with mild regurgitation, essential hypertension, history of stroke, and pulmonary hypertension. 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 During the admission for sepsis, she was aggressively hydrated. The patient presented to [facility] ER 04/08/2020 from [facility] where she currently resides for rehabilitation, for a few day history of worsening shortness of breath, peripheral edema, orthopnea, and palpitations with tachycardia. She wears supplemental oxygen at night. She was hypoxic with oxygen saturations in the upper 80s, and was placed on 2L O2. ECG revealed atrial fibrillation at a rate of 103 bpm without acute ST wave abnormalities. Chest xray showed cardiomegaly with moderate to large bilateral pleural effusion, right greater than left. BNP elevated to 1381, troponin normal, WBC 14.1, TSH 7.474 (up from 1.828 three weeks ago).  Her dose of levothyroxine was increased. The patient was started on IV Lasix. At this time, she denies palpitations or chest pain.  Hospital Course:   Aacute on chronic diastolic CHF: much improved. Net out 10 L. Kidney function normal - lasix 40 po qd @ d/c - outpt cardiology f/u 1-2 wks  History diverticulitis s/p colectomy with colostomy. Recent hospitalization for infection at surgical site, now resolved. Ostomy output appears normal - will need outpt f/u w/ Dr. Claudine Mouton, son aware  PAF: rate controlled.  - continued on amiodarone, cardizem, metoprolol & eliquis   Hypotension. - continued home midodrine  Hypothyroidism:  - continued on home dose of levothyroxine   GERD:  - continued on pantoprazole   Procedures:  TTE   Consultations:  cardiology  Discharge Exam: Vitals:   04/13/20 0719 04/13/20 1111  BP: 110/81 104/72  Pulse: 88 92  Resp: 17 17  Temp: 97.9 F (36.6 C) (!) 97.4 F (36.3 C)  SpO2: 98% 97%    General exam: Appears calm & comfortable  Respiratory system: decreased breath sounds b/l with faint rales Cardiovascular system: Irregularly irregular.  Harsh holosystolic murmur Gastrointestinal system: Abdomen is nondistended, soft and nontender. Ostomy bag noted. Hypoactive bowel sounds heard. C/d/i around ostomy bag Central nervous system: Alert and oriented. Moves all 4 extremities  Psychiatry: Judgement and insight appear normal. Normal mood and affect   Discharge Instructions   Discharge Instructions    (HEART FAILURE PATIENTS) Call MD:  Anytime you have any of the following symptoms: 1) 3 pound weight gain in 24 hours or 5 pounds in 1 week 2) shortness of breath, with or without a dry hacking cough 3) swelling in the hands, feet or stomach 4) if  you have to sleep on extra pillows at night in order to breathe.   Complete by: As directed    Call MD for:  difficulty breathing, headache or visual disturbances   Complete by: As directed     Call MD for:  extreme fatigue   Complete by: As directed    Call MD for:  persistant dizziness or light-headedness   Complete by: As directed    Call MD for:  persistant nausea and vomiting   Complete by: As directed    Call MD for:  redness, tenderness, or signs of infection (pain, swelling, redness, odor or green/yellow discharge around incision site)   Complete by: As directed    Call MD for:  severe uncontrolled pain   Complete by: As directed    Call MD for:  temperature >100.4   Complete by: As directed    Diet - low sodium heart healthy   Complete by: As directed    Discharge wound care:   Complete by: As directed    Ostomy care   Increase activity slowly   Complete by: As directed      Allergies as of 04/13/2020      Reactions   Prednisone Hives   Predicort [prednisolone Acetate]    HIVES,      Medication List    STOP taking these medications   amoxicillin-clavulanate 875-125 MG tablet Commonly known as: AUGMENTIN     TAKE these medications   acetaminophen 325 MG tablet Commonly known as: TYLENOL Take 650 mg by mouth every 6 (six) hours as needed.   amiodarone 200 MG tablet Commonly known as: PACERONE Take 1 tablet (200 mg total) by mouth daily.   apixaban 5 MG Tabs tablet Commonly known as: ELIQUIS Take 1 tablet (5 mg total) by mouth 2 (two) times daily.   diltiazem 120 MG 24 hr capsule Commonly known as: CARDIZEM CD Take 1 capsule (120 mg total) by mouth daily.   Ensure Max Protein Liqd Take 330 mLs (11 oz total) by mouth 2 (two) times daily.   folic acid 1 MG tablet Commonly known as: FOLVITE Take 1 mg by mouth daily. Per tube once daily   furosemide 40 MG tablet Commonly known as: LASIX Take 1 tablet (40 mg total) by mouth daily. Start taking on: April 14, 2020 What changed:   medication strength  how much to take   hydrocerin Crea Apply 1 application topically 2 (two) times daily.   hydrocortisone 2.5 % rectal cream Commonly  known as: ANUSOL-HC Apply 1 application topically 2 (two) times daily as needed.   levothyroxine 75 MCG tablet Commonly known as: SYNTHROID Take 75 mcg by mouth daily before breakfast.   metoprolol succinate 50 MG 24 hr tablet Commonly known as: TOPROL-XL Take 1 tablet (50 mg total) by mouth 2 (two) times daily. Take with or immediately following a meal.   midodrine 2.5 MG tablet Commonly known as: PROAMATINE Take 1 tablet (2.5 mg total) by mouth 3 (three) times daily with meals.   multivitamin with minerals Tabs tablet Take 1 tablet by mouth daily.   omeprazole 20 MG capsule Commonly known as: PRILOSEC Take 20 mg by mouth daily.   potassium chloride 10 MEQ tablet Commonly known as: KLOR-CON Take 10 mEq by mouth daily.            Discharge Care Instructions  (From admission, onward)         Start     Ordered   04/13/20 0000  Discharge wound care:       Comments: Ostomy care   04/13/20 1142         Allergies  Allergen Reactions  . Prednisone Hives  . Predicort [Prednisolone Acetate]     HIVES,    Contact information for follow-up providers    [facility] Follow up on 04/18/2020.   Specialty: Cardiology Why: at 9:00am. Enter through the [facility] Mall entrance Contact information: [address] [place] [address] [phone]       Campbell Lerner, MD. Schedule an appointment as soon as possible for a visit.   Specialty: General Surgery Contact information: [address] [place] [address] [phone]            Contact information for after-discharge care    Destination    [facility] .   Service: Skilled Nursing Contact information: [address] [place] [address] [phone]                   The results of significant diagnostics from this hospitalization (including imaging, microbiology,  ancillary and laboratory) are listed below for reference.    Significant Diagnostic Studies: DG Chest 2 View  Result Date: 04/11/2020 CLINICAL DATA:  74 year old female with a history congestive heart failure EXAM: CHEST - 2 VIEW COMPARISON:  04/09/2020, CT chest 03/28/2020 FINDINGS: Cardiomediastinal silhouette unchanged with the heart borders partially obscured by overlying lung and pleural disease. Opacity at the bilateral lung bases partially obscuring the hemidiaphragm and heart borders. No pneumothorax. Opacity at the posterior lung base on the lateral view. Mild interlobular septal thickening. IMPRESSION: Bilateral pleural effusions with associated atelectasis/consolidation. Electronically Signed   By: Gilmer Mor D.O.   On: 04/11/2020 08:10   DG Chest 2 View  Result Date: 03/19/2020 CLINICAL DATA:  Pneumonia. EXAM: CHEST - 2 VIEW COMPARISON:  March 13, 2020. FINDINGS: Stable cardiomegaly. No pneumothorax is noted. Right-sided PICC line is unchanged in position. Mild bilateral pleural effusions are noted, right greater than left. Probable bibasilar atelectasis is noted as well. Bony thorax is unremarkable. IMPRESSION: Mild bilateral pleural effusions, right greater than left. Probable bibasilar atelectasis. Electronically Signed   By: Lupita Raider M.D.   On: 03/19/2020 13:12   CT Angio Chest PE W/Cm &/Or Wo Cm  Result Date: 03/29/2020 CLINICAL DATA:  Chest pain with shortness of breath. History of bowel obstruction with surgery times 2 weeks. Recent infection. Staples removed yesterday. EXAM: CT ANGIOGRAPHY CHEST CT ABDOMEN AND PELVIS WITH CONTRAST TECHNIQUE: Multidetector CT imaging of the chest was performed using the standard protocol during bolus administration of intravenous contrast. Multiplanar CT image reconstructions and MIPs were obtained to evaluate the vascular anatomy. Multidetector CT imaging of the abdomen and pelvis was performed using the standard protocol during bolus  administration of intravenous contrast. CONTRAST:  OMNIPAQUE IOHEXOL 350 MG/ML SOLN COMPARISON:  CT dated 03/25/2020 FINDINGS: CTA CHEST FINDINGS Cardiovascular: Contrast injection is sufficient to demonstrate satisfactory opacification of the pulmonary arteries to the segmental level. There is no pulmonary embolus or evidence of right heart strain. The size of the main pulmonary artery is normal. There is significant cardiomegaly with biatrial enlargement. Mediastinum/Nodes: -- No mediastinal lymphadenopathy. -- No hilar lymphadenopathy. -- No axillary lymphadenopathy. -- No supraclavicular lymphadenopathy. -- Normal thyroid gland where visualized. -  Unremarkable esophagus. Lungs/Pleura: There are trace bilateral pleural effusions. There is atelectasis at the lung bases. There is mild  interlobular septal thickening. There is no pneumothorax. The trachea is unremarkable. Musculoskeletal: No chest wall abnormality. No bony spinal canal stenosis. CT ABDOMEN and PELVIS FINDINGS Hepatobiliary: Low-attenuation nodules are again noted throughout the patient's left and right hepatic lobes as before. Status post cholecystectomy.There is mild intrahepatic biliary ductal dilatation, unchanged from prior study. Pancreas: Normal contours without ductal dilatation. No peripancreatic fluid collection. Spleen: Unremarkable. Adrenals/Urinary Tract: --Adrenal glands: Unremarkable. --Right kidney/ureter: No hydronephrosis or radiopaque kidney stones. --Left kidney/ureter: No hydronephrosis or radiopaque kidney stones. --Urinary bladder: Unremarkable. Stomach/Bowel: --Stomach/Duodenum: No hiatal hernia or other gastric abnormality. Normal duodenal course and caliber. --Small bowel: There are mildly dilated hyperenhancing loops of small bowel in the left mid abdomen. There is a right lower quadrant ileostomy without evidence for obstruction. --Colon: Patient is status post subtotal colectomy. --Appendix: Surgically absent.  Vascular/Lymphatic: Atherosclerotic calcification is present within the non-aneurysmal abdominal aorta, without hemodynamically significant stenosis. --No retroperitoneal lymphadenopathy. --No mesenteric lymphadenopathy. --No pelvic or inguinal lymphadenopathy. Reproductive: There is a probable fibroid uterus. Other: There is a fat containing lesion abutting the dome of the urinary bladder measuring approximately 2.7 cm. This is somewhat similar cross prior studies and may represent an area of fat necrosis. There is a midline abdominal wall incision that appears open. Inferiorly there is packing material. Superior to the umbilicus, there is a somewhat ill-defined 6.3 by 1.6 by 0.6 cm fluid collection deep to the midline incision. There is no rim enhancement of this collection. There is a trace amount of free fluid in the abdomen. There is no free air. Musculoskeletal. No acute displaced fractures. Review of the MIP images confirms the above findings. IMPRESSION: 1. No acute pulmonary embolism. 2. Trace bilateral pleural effusions with atelectasis. 3. Cardiomegaly with biatrial enlargement. There are findings of mild interstitial edema. 4. Mildly dilated hyperenhancing loops of small bowel in the left mid abdomen may represent ileus or enteritis. 5. Fluid collection deep to the midline incision superior to the umbilicus as detailed above. This is favored to represent a postoperative seroma or hematoma but has increased in size from prior study. A developing abscess is not excluded. 6. Trace amount of free fluid in the abdomen. 7. Fibroid uterus. 8. Fat containing lesion abutting the dome of the urinary bladder favored to represent an area of fat necrosis. Aortic Atherosclerosis (ICD10-I70.0). Electronically Signed   By: Katherine Mantle M.D.   On: 03/29/2020 00:03   CT Abdomen Pelvis W Contrast  Result Date: 03/29/2020 CLINICAL DATA:  Chest pain with shortness of breath. History of bowel obstruction with  surgery times 2 weeks. Recent infection. Staples removed yesterday. EXAM: CT ANGIOGRAPHY CHEST CT ABDOMEN AND PELVIS WITH CONTRAST TECHNIQUE: Multidetector CT imaging of the chest was performed using the standard protocol during bolus administration of intravenous contrast. Multiplanar CT image reconstructions and MIPs were obtained to evaluate the vascular anatomy. Multidetector CT imaging of the abdomen and pelvis was performed using the standard protocol during bolus administration of intravenous contrast. CONTRAST:  OMNIPAQUE IOHEXOL 350 MG/ML SOLN COMPARISON:  CT dated 03/25/2020 FINDINGS: CTA CHEST FINDINGS Cardiovascular: Contrast injection is sufficient to demonstrate satisfactory opacification of the pulmonary arteries to the segmental level. There is no pulmonary embolus or evidence of right heart strain. The size of the main pulmonary artery is normal. There is significant cardiomegaly with biatrial enlargement. Mediastinum/Nodes: -- No mediastinal lymphadenopathy. -- No hilar lymphadenopathy. -- No axillary lymphadenopathy. -- No supraclavicular lymphadenopathy. -- Normal thyroid gland where visualized. -  Unremarkable esophagus. Lungs/Pleura: There  are trace bilateral pleural effusions. There is atelectasis at the lung bases. There is mild interlobular septal thickening. There is no pneumothorax. The trachea is unremarkable. Musculoskeletal: No chest wall abnormality. No bony spinal canal stenosis. CT ABDOMEN and PELVIS FINDINGS Hepatobiliary: Low-attenuation nodules are again noted throughout the patient's left and right hepatic lobes as before. Status post cholecystectomy.There is mild intrahepatic biliary ductal dilatation, unchanged from prior study. Pancreas: Normal contours without ductal dilatation. No peripancreatic fluid collection. Spleen: Unremarkable. Adrenals/Urinary Tract: --Adrenal glands: Unremarkable. --Right kidney/ureter: No hydronephrosis or radiopaque kidney stones. --Left  kidney/ureter: No hydronephrosis or radiopaque kidney stones. --Urinary bladder: Unremarkable. Stomach/Bowel: --Stomach/Duodenum: No hiatal hernia or other gastric abnormality. Normal duodenal course and caliber. --Small bowel: There are mildly dilated hyperenhancing loops of small bowel in the left mid abdomen. There is a right lower quadrant ileostomy without evidence for obstruction. --Colon: Patient is status post subtotal colectomy. --Appendix: Surgically absent. Vascular/Lymphatic: Atherosclerotic calcification is present within the non-aneurysmal abdominal aorta, without hemodynamically significant stenosis. --No retroperitoneal lymphadenopathy. --No mesenteric lymphadenopathy. --No pelvic or inguinal lymphadenopathy. Reproductive: There is a probable fibroid uterus. Other: There is a fat containing lesion abutting the dome of the urinary bladder measuring approximately 2.7 cm. This is somewhat similar cross prior studies and may represent an area of fat necrosis. There is a midline abdominal wall incision that appears open. Inferiorly there is packing material. Superior to the umbilicus, there is a somewhat ill-defined 6.3 by 1.6 by 0.6 cm fluid collection deep to the midline incision. There is no rim enhancement of this collection. There is a trace amount of free fluid in the abdomen. There is no free air. Musculoskeletal. No acute displaced fractures. Review of the MIP images confirms the above findings. IMPRESSION: 1. No acute pulmonary embolism. 2. Trace bilateral pleural effusions with atelectasis. 3. Cardiomegaly with biatrial enlargement. There are findings of mild interstitial edema. 4. Mildly dilated hyperenhancing loops of small bowel in the left mid abdomen may represent ileus or enteritis. 5. Fluid collection deep to the midline incision superior to the umbilicus as detailed above. This is favored to represent a postoperative seroma or hematoma but has increased in size from prior study. A  developing abscess is not excluded. 6. Trace amount of free fluid in the abdomen. 7. Fibroid uterus. 8. Fat containing lesion abutting the dome of the urinary bladder favored to represent an area of fat necrosis. Aortic Atherosclerosis (ICD10-I70.0). Electronically Signed   By: Katherine Mantle M.D.   On: 03/29/2020 00:03   CT ABDOMEN PELVIS W CONTRAST  Result Date: 03/25/2020 CLINICAL DATA:  Postop 1 week subtotal colectomy. Purulence drainage from wound. Evaluate for abscess. colectomy for bowel obstruction. EXAM: CT ABDOMEN AND PELVIS WITH CONTRAST TECHNIQUE: Multidetector CT imaging of the abdomen and pelvis was performed using the standard protocol following bolus administration of intravenous contrast. CONTRAST:  OMNIPAQUE IOHEXOL 300 MG/ML  SOLN COMPARISON:  CT 03/19/2020 FINDINGS: Lower chest: Improving bilateral pleural effusions. Moderate effusion remains on the RIGHT. Near complete resolution of LEFT effusion. There is passive atelectasis in the RIGHT lower lobe. Hepatobiliary: Several hypodense lesions in liver unchanged consistent with cysts. Postcholecystectomy. Pancreas: Pancreas is normal. No ductal dilatation. No pancreatic inflammation. Spleen: Normal spleen Adrenals/urinary tract: Adrenal glands and kidneys are normal. The ureters and bladder normal. Stomach/Bowel: Stomach is distended with oral contrast. There is mild dilatation of the proximal small bowel 3.2 cm similar comparison CT. Contrast flows in the mid small bowel but does not reach the ileostomy in  the course of the study. There is no transition point. RIGHT abdominal wall ileostomy without complicating features. Vascular/Lymphatic: Abdominal aorta is normal caliber with atherosclerotic calcification. There is no retroperitoneal or periportal lymphadenopathy. No pelvic lymphadenopathy. Reproductive: Uterus normal Other: Interval reduction in intraperitoneal free fluid seen on comparison exam. Small amount fluid remains in  the RIGHT abdominal peritoneal space. Near complete resolution of fluid within the pelvis. Small subcutaneous fluid collection along the inferior margin of the surgical wound staples is increased in diameter compared to prior. This triangular collection measures 2.3 x 3.0 cm compared to 1.2 x 1.5 cm on comparison exam. This collection is superficial to the muscle layer does not appear to communicate with the peritoneal space. Musculoskeletal: No aggressive osseous lesion. IMPRESSION: 1. Increased organization of fluid collection along the inferior margin of the midline ventral wound. This collection does not appear to communicate with the peritoneal space. 2. Persistent distended stomach and dilated proximal small bowel with no obstructing lesion or transition point. Findings continue to suggest adynamic ileus. No evidence obstruction of the RIGHT lower quadrant ileostomy. 3. Interval decrease in intraperitoneal free fluid. 4. Interval decrease in pleural effusions. Electronically Signed   By: Genevive Bi M.D.   On: 03/25/2020 18:45   CT ABDOMEN PELVIS W CONTRAST  Result Date: 03/19/2020 CLINICAL DATA:  Worsening leukocytosis 1 week following subtotal colectomy. Abdominal abscess/infection suspected. EXAM: CT ABDOMEN AND PELVIS WITH CONTRAST TECHNIQUE: Multidetector CT imaging of the abdomen and pelvis was performed using the standard protocol following bolus administration of intravenous contrast. CONTRAST:  OMNIPAQUE IOHEXOL 300 MG/ML  SOLN COMPARISON:  Abdominopelvic CT 03/11/2020 and 12/21/2019. FINDINGS: Lower chest: Slight interval enlargement of moderate right and small left pleural effusions with associated worsening atelectasis dependently in both lung bases. Stable cardiomegaly and coronary artery atherosclerosis. No significant pericardial fluid. Hepatobiliary: Stable appendix cysts. Stable mild extrahepatic biliary dilatation post cholecystectomy. Pancreas: Unremarkable. No pancreatic  ductal dilatation or surrounding inflammatory changes. Spleen: Normal in size without focal abnormality. Adrenals/Urinary Tract: Both adrenal glands appear normal. Stable mild bilateral renal cortical thinning. No renal mass, urinary tract calculus or hydronephrosis. The bladder appears normal. Stomach/Bowel: Enteric contrast was administered and extends to the ileostomy in the right mid abdominal wall. There is mild diffuse dilatation of the stomach and small bowel without focal wall thickening or enteric contrast extravasation. Subtotal colectomy has been performed without evidence of suture breakdown at the [facility]. Vascular/Lymphatic: There are no enlarged abdominal or pelvic lymph nodes. Aortic and branch vessel atherosclerosis. The portal, superior mesenteric and splenic veins are patent. Reproductive: Enhancing mass anteriorly in the lower uterine segment measuring 4.4 x 3.1 cm on image 81/2, grossly stable from prior CTs and consistent with an anterior fibroid. No suspicious adnexal findings. Other: Small amount of pelvic and interloop ascites without suspicious peritoneal enhancement or focal extraluminal fluid collection. No free intraperitoneal air. Musculoskeletal: No acute or significant osseous findings. IMPRESSION: 1. Interval subtotal colectomy and ileostomy. Mild diffuse dilatation of the stomach and small bowel without focal wall thickening or enteric contrast extravasation, suggesting adynamic ileus. 2. Small amount of pelvic and interloop ascites without suspicious peritoneal enhancement or focal extraluminal fluid collection. 3. Slight interval enlargement of right greater than left pleural effusions with associated worsening atelectasis dependently in both lung bases. 4. Uterine fibroid. 5. Aortic Atherosclerosis (ICD10-I70.0). Electronically Signed   By: Carey Bullocks M.D.   On: 03/19/2020 12:20   DG Chest Port 1 View  Result Date: 04/09/2020 CLINICAL DATA:  Shortness of breath  EXAM: PORTABLE CHEST 1 VIEW COMPARISON:  Chest x-ray dated 03/28/2020 FINDINGS: There is cardiomegaly with evidence for congestive heart failure. There are moderate to large bilateral pleural effusions, right greater than left. There is no pneumothorax. Bibasilar airspace disease is noted and favored to represent atelectasis. IMPRESSION: 1. Cardiomegaly with evidence for congestive heart failure. 2. Moderate to large bilateral pleural effusions, right greater than left. Electronically Signed   By: Katherine Mantle M.D.   On: 04/09/2020 00:27   DG Chest Port 1 View  Result Date: 03/28/2020 CLINICAL DATA:  Questionable sepsis, sharp 5/10 chest pain, shortness of breath history of bowel obstruction, now post subtotal colectomy EXAM: PORTABLE CHEST 1 VIEW COMPARISON:  Radiograph 03/19/2020 FINDINGS: There is diffuse hazy interstitial opacity in the lungs with a mid to lower lung predominance and some faint septal thickening as well as central vascular congestion. More hazy opacities present in the right infrahilar lung. Small bilateral effusions are present, likely diminished from comparison accounting for differences in technique. No pneumothorax. Cardiomegaly similar to prior counting for differences in technique. The osseous structures appear diffusely demineralized which may limit detection of small or nondisplaced fractures. No acute osseous abnormality or suspicious osseous lesion. Degenerative changes are present in the imaged spine and shoulders. Soft tissues unremarkable. Telemetry leads overlie the chest. IMPRESSION: 1. Appearance could suggest some mild CHF/volume overload with cardiomegaly and features of interstitial with trace bilateral effusions. 2. More hazy opacity in the right infrahilar lung is favored to reflect some developing alveolar edema though early airspace disease is not excluded in the setting of sepsis. Electronically Signed   By: Kreg Shropshire M.D.   On: 03/28/2020 22:16     Microbiology: Recent Results (from the past 240 hour(s))  Resp Panel by RT PCR (RSV, Flu A&B, Covid) - Nasopharyngeal Swab     Status: None   Collection Time: 04/09/20  1:02 AM   Specimen: Nasopharyngeal Swab  Result Value Ref Range Status   SARS Coronavirus 2 by RT PCR NEGATIVE NEGATIVE Final    Comment: (NOTE) SARS-CoV-2 target nucleic acids are NOT DETECTED.  The SARS-CoV-2 RNA is generally detectable in upper respiratoy specimens during the acute phase of infection. The lowest concentration of SARS-CoV-2 viral copies this assay can detect is 131 copies/mL. A negative result does not preclude SARS-Cov-2 infection and should not be used as the sole basis for treatment or other patient management decisions. A negative result may occur with  improper specimen collection/handling, submission of specimen other than nasopharyngeal swab, presence of viral mutation(s) within the areas targeted by this assay, and inadequate number of viral copies (<131 copies/mL). A negative result must be combined with clinical observations, patient history, and epidemiological information. The expected result is Negative.  Fact Sheet for Patients:  https://www.moore.com/  Fact Sheet for Healthcare Providers:  https://www.young.biz/  This test is no t yet approved or cleared by the [place] FDA and  has been authorized for detection and/or diagnosis of SARS-CoV-2 by FDA under an Emergency Use Authorization (EUA). This EUA will remain  in effect (meaning this test can be used) for the duration of the COVID-19 declaration under Section 564(b)(1) of the Act, 21 U.S.C. section 360bbb-3(b)(1), unless the authorization is terminated or revoked sooner.     Influenza A by PCR NEGATIVE NEGATIVE Final   Influenza B by PCR NEGATIVE NEGATIVE Final    Comment: (NOTE) The Xpert Xpress SARS-CoV-2/FLU/RSV assay is intended as an aid in  the diagnosis of  influenza  from Nasopharyngeal swab specimens and  should not be used as a sole basis for treatment. Nasal washings and  aspirates are unacceptable for Xpert Xpress SARS-CoV-2/FLU/RSV  testing.  Fact Sheet for Patients: https://www.moore.com/  Fact Sheet for Healthcare Providers: https://www.young.biz/  This test is not yet approved or cleared by the [place] FDA and  has been authorized for detection and/or diagnosis of SARS-CoV-2 by  FDA under an Emergency Use Authorization (EUA). This EUA will remain  in effect (meaning this test can be used) for the duration of the  Covid-19 declaration under Section 564(b)(1) of the Act, 21  U.S.C. section 360bbb-3(b)(1), unless the authorization is  terminated or revoked.    Respiratory Syncytial Virus by PCR NEGATIVE NEGATIVE Final    Comment: (NOTE) Fact Sheet for Patients: https://www.moore.com/  Fact Sheet for Healthcare Providers: https://www.young.biz/  This test is not yet approved or cleared by the [place] FDA and  has been authorized for detection and/or diagnosis of SARS-CoV-2 by  FDA under an Emergency Use Authorization (EUA). This EUA will remain  in effect (meaning this test can be used) for the duration of the  COVID-19 declaration under Section 564(b)(1) of the Act, 21 U.S.C.  section 360bbb-3(b)(1), unless the authorization is terminated or  revoked. Performed at [facility], [address] [place], [place] [address]      Labs: Basic Metabolic Panel: Recent Labs  Lab 04/08/20 2350 04/09/20 0419 04/10/20 0424 04/11/20 0546 04/12/20 0515 04/13/20 0501  NA 129*  --  132* 130* 130* 126*  K 4.8  --  3.7 3.6 3.9 4.2  CL 97*  --  97* 94* 93* 91*  CO2 22  --  27 29 27 25   GLUCOSE 117*  --  102* 99 95 99  BUN 11  --  12 10 13 14   CREATININE 0.81  --  0.86 0.64 0.63 0.76  CALCIUM 8.3*  --  8.4* 8.4* 8.3* 8.6*  MG  --  2.0  --    --   --   --    Liver Function Tests: Recent Labs  Lab 04/08/20 2350  AST 26  ALT 34  ALKPHOS 107  BILITOT 0.8  PROT 6.3*  ALBUMIN 2.7*   No results for input(s): LIPASE, AMYLASE in the last 168 hours. No results for input(s): AMMONIA in the last 168 hours. CBC: Recent Labs  Lab 04/08/20 2350 04/09/20 0419 04/10/20 0424 04/11/20 0546  WBC 14.1* 13.2* 14.0* 11.9*  NEUTROABS 11.5* 11.0*  --   --   HGB 10.8* 11.1* 10.2* 11.0*  HCT 33.6* 34.6* 32.7* 33.7*  MCV 82.2 82.4 83.2 81.4  PLT 313 311 271 305   Cardiac Enzymes: No results for input(s): CKTOTAL, CKMB, CKMBINDEX, TROPONINI in the last 168 hours. BNP: BNP (last 3 results) Recent Labs    03/13/20 0303 03/13/20 1718 04/08/20 2350  BNP 981.4* 419.8* 1,381.6*    ProBNP (last 3 results) No results for input(s): PROBNP in the last 8760 hours.  CBG: No results for input(s): GLUCAP in the last 168 hours.     Signed:  05/13/20 MD.  [facility] 04/13/2020, 11:43 AM

## 2020-04-13 NOTE — TOC Transition Note (Signed)
Transition of Care [facility]) - CM/SW Discharge Note   Patient Details  Name: [name] MRN: [id number] Date of Birth: [birth year]  Transition of Care [facility]) CM/SW Contact:  Maree Krabbe, LCSW Phone Number: 04/13/2020, 2:36 PM   Clinical Narrative:  Clinical Social Worker facilitated patient discharge including contacting patient family and facility to confirm patient discharge plans.  Clinical information faxed to facility and family agreeable with plan.  CSW arranged ambulance transport via [facility] to [facility] .  RN to call [phone] for report prior to discharge.     Final next level of care: Skilled Nursing Facility Barriers to Discharge: No Barriers Identified   Patient Goals and CMS Choice Patient states their goals for this hospitalization and ongoing recovery are:: to get better   Choice offered to / list presented to : Patient  Discharge Placement              Patient chooses bed at: [facility] Patient to be transferred to facility by: [facility]   Patient and family notified of of transfer: 04/13/20  Discharge Plan and Services In-house Referral: Clinical Social Work   Post Acute Care Choice: Skilled Nursing Facility                               Social Determinants of Health (SDOH) Interventions     Readmission Risk Interventions Readmission Risk Prevention Plan 04/10/2020 03/30/2020  Transportation Screening Complete Complete  PCP or Specialist Appt within 3-5 Days - Complete  HRI or Home Care Consult - Complete  Social Work Consult for Recovery Care Planning/Counseling - Complete  Palliative Care Screening - Not Applicable  Medication Review [employer]) Complete Complete  PCP or Specialist appointment within 3-5 days of discharge Complete -  HRI or Home Care Consult Complete -  SW Recovery Care/Counseling Consult Complete -  Palliative Care Screening Not Applicable -  Skilled Nursing Facility Complete -  Some  recent data might be hidden

## 2020-04-18 ENCOUNTER — Emergency Department: Payer: Medicare Other

## 2020-04-18 ENCOUNTER — Other Ambulatory Visit: Payer: Self-pay

## 2020-04-18 ENCOUNTER — Inpatient Hospital Stay
Admission: EM | Admit: 2020-04-18 | Discharge: 2020-04-24 | DRG: 291 | Disposition: A | Discharge status: Skilled Nursing Facility | Payer: Medicare Other | Source: Skilled Nursing Facility | Attending: Internal Medicine | Admitting: Internal Medicine

## 2020-04-18 ENCOUNTER — Ambulatory Visit: Payer: Medicare Other | Admitting: Family

## 2020-04-18 ENCOUNTER — Encounter: Payer: Self-pay | Admitting: Internal Medicine

## 2020-04-18 DIAGNOSIS — Z803 Family history of malignant neoplasm of breast: Secondary | ICD-10-CM | POA: Diagnosis not present

## 2020-04-18 DIAGNOSIS — Z79899 Other long term (current) drug therapy: Secondary | ICD-10-CM

## 2020-04-18 DIAGNOSIS — E871 Hypo-osmolality and hyponatremia: Principal | ICD-10-CM | POA: Diagnosis present

## 2020-04-18 DIAGNOSIS — Z9981 Dependence on supplemental oxygen: Secondary | ICD-10-CM

## 2020-04-18 DIAGNOSIS — Z9049 Acquired absence of other specified parts of digestive tract: Secondary | ICD-10-CM

## 2020-04-18 DIAGNOSIS — R350 Frequency of micturition: Secondary | ICD-10-CM | POA: Diagnosis present

## 2020-04-18 DIAGNOSIS — I482 Chronic atrial fibrillation, unspecified: Secondary | ICD-10-CM | POA: Diagnosis present

## 2020-04-18 DIAGNOSIS — J9811 Atelectasis: Secondary | ICD-10-CM | POA: Diagnosis present

## 2020-04-18 DIAGNOSIS — Z20822 Contact with and (suspected) exposure to covid-19: Secondary | ICD-10-CM | POA: Diagnosis present

## 2020-04-18 DIAGNOSIS — G40909 Epilepsy, unspecified, not intractable, without status epilepticus: Secondary | ICD-10-CM | POA: Diagnosis present

## 2020-04-18 DIAGNOSIS — K589 Irritable bowel syndrome without diarrhea: Secondary | ICD-10-CM | POA: Diagnosis present

## 2020-04-18 DIAGNOSIS — I9589 Other hypotension: Secondary | ICD-10-CM | POA: Diagnosis present

## 2020-04-18 DIAGNOSIS — K219 Gastro-esophageal reflux disease without esophagitis: Secondary | ICD-10-CM | POA: Diagnosis present

## 2020-04-18 DIAGNOSIS — Z8042 Family history of malignant neoplasm of prostate: Secondary | ICD-10-CM | POA: Diagnosis not present

## 2020-04-18 DIAGNOSIS — I10 Essential (primary) hypertension: Secondary | ICD-10-CM | POA: Diagnosis not present

## 2020-04-18 DIAGNOSIS — I639 Cerebral infarction, unspecified: Secondary | ICD-10-CM | POA: Diagnosis not present

## 2020-04-18 DIAGNOSIS — J9601 Acute respiratory failure with hypoxia: Secondary | ICD-10-CM | POA: Diagnosis not present

## 2020-04-18 DIAGNOSIS — J918 Pleural effusion in other conditions classified elsewhere: Secondary | ICD-10-CM | POA: Diagnosis present

## 2020-04-18 DIAGNOSIS — Z7901 Long term (current) use of anticoagulants: Secondary | ICD-10-CM | POA: Diagnosis not present

## 2020-04-18 DIAGNOSIS — E039 Hypothyroidism, unspecified: Secondary | ICD-10-CM | POA: Diagnosis present

## 2020-04-18 DIAGNOSIS — I4892 Unspecified atrial flutter: Secondary | ICD-10-CM | POA: Diagnosis present

## 2020-04-18 DIAGNOSIS — Z888 Allergy status to other drugs, medicaments and biological substances status: Secondary | ICD-10-CM

## 2020-04-18 DIAGNOSIS — Z9889 Other specified postprocedural states: Secondary | ICD-10-CM

## 2020-04-18 DIAGNOSIS — Z79890 Hormone replacement therapy (postmenopausal): Secondary | ICD-10-CM

## 2020-04-18 DIAGNOSIS — Z932 Ileostomy status: Secondary | ICD-10-CM | POA: Diagnosis not present

## 2020-04-18 DIAGNOSIS — J9621 Acute and chronic respiratory failure with hypoxia: Secondary | ICD-10-CM

## 2020-04-18 DIAGNOSIS — E8889 Other specified metabolic disorders: Secondary | ICD-10-CM | POA: Diagnosis present

## 2020-04-18 DIAGNOSIS — D509 Iron deficiency anemia, unspecified: Secondary | ICD-10-CM | POA: Diagnosis present

## 2020-04-18 DIAGNOSIS — I5033 Acute on chronic diastolic (congestive) heart failure: Secondary | ICD-10-CM | POA: Diagnosis present

## 2020-04-18 DIAGNOSIS — I110 Hypertensive heart disease with heart failure: Principal | ICD-10-CM | POA: Diagnosis present

## 2020-04-18 DIAGNOSIS — I959 Hypotension, unspecified: Secondary | ICD-10-CM | POA: Diagnosis not present

## 2020-04-18 DIAGNOSIS — J90 Pleural effusion, not elsewhere classified: Secondary | ICD-10-CM | POA: Diagnosis not present

## 2020-04-18 DIAGNOSIS — Z8673 Personal history of transient ischemic attack (TIA), and cerebral infarction without residual deficits: Secondary | ICD-10-CM | POA: Diagnosis not present

## 2020-04-18 DIAGNOSIS — J9611 Chronic respiratory failure with hypoxia: Secondary | ICD-10-CM | POA: Diagnosis present

## 2020-04-18 DIAGNOSIS — D72829 Elevated white blood cell count, unspecified: Secondary | ICD-10-CM | POA: Diagnosis not present

## 2020-04-18 DIAGNOSIS — R0602 Shortness of breath: Secondary | ICD-10-CM

## 2020-04-18 DIAGNOSIS — D638 Anemia in other chronic diseases classified elsewhere: Secondary | ICD-10-CM | POA: Diagnosis present

## 2020-04-18 DIAGNOSIS — Z8661 Personal history of infections of the central nervous system: Secondary | ICD-10-CM

## 2020-04-18 LAB — BASIC METABOLIC PANEL
Anion gap: 12 (ref 5–15)
Anion gap: 13 (ref 5–15)
Anion gap: 12 (ref 5–15)
BUN: 12 mg/dL (ref 8–23)
BUN: 13 mg/dL (ref 8–23)
BUN: 13 mg/dL (ref 8–23)
CO2: 20 mmol/L — ABNORMAL LOW (ref 22–32)
CO2: 19 mmol/L — ABNORMAL LOW (ref 22–32)
CO2: 21 mmol/L — ABNORMAL LOW (ref 22–32)
Calcium: 8.5 mg/dL — ABNORMAL LOW (ref 8.9–10.3)
Calcium: 7.9 mg/dL — ABNORMAL LOW (ref 8.9–10.3)
Calcium: 8 mg/dL — ABNORMAL LOW (ref 8.9–10.3)
Chloride: 84 mmol/L — ABNORMAL LOW (ref 98–111)
Chloride: 88 mmol/L — ABNORMAL LOW (ref 98–111)
Chloride: 86 mmol/L — ABNORMAL LOW (ref 98–111)
Creatinine, Ser: 0.75 mg/dL (ref 0.44–1.00)
Creatinine, Ser: 0.75 mg/dL (ref 0.44–1.00)
Creatinine, Ser: 0.72 mg/dL (ref 0.44–1.00)
GFR, Estimated: >60 mL/min (ref >60)
GFR, Estimated: >60 mL/min (ref >60)
GFR, Estimated: >60 mL/min (ref >60)
Glucose, Bld: 83 mg/dL (ref 70–99)
Glucose, Bld: 139 mg/dL — ABNORMAL HIGH (ref 70–99)
Glucose, Bld: 101 mg/dL — ABNORMAL HIGH (ref 70–99)
Potassium: 4.3 mmol/L (ref 3.5–5.1)
Potassium: 3.6 mmol/L (ref 3.5–5.1)
Potassium: 3.4 mmol/L — ABNORMAL LOW (ref 3.5–5.1)
Sodium: 116 mmol/L — CL (ref 135–145)
Sodium: 120 mmol/L — ABNORMAL LOW (ref 135–145)
Sodium: 119 mmol/L — CL (ref 135–145)

## 2020-04-18 LAB — CBC WITH DIFFERENTIAL/PLATELET
Abs Immature Granulocytes: 0.72 10*3/uL — ABNORMAL HIGH (ref 0.00–0.07)
Basophils Absolute: 0.1 10*3/uL (ref 0.0–0.1)
Basophils Relative: 1 %
Eosinophils Absolute: 0.1 10*3/uL (ref 0.0–0.5)
Eosinophils Relative: 1 %
HCT: 35.6 % — ABNORMAL LOW (ref 36.0–46.0)
Hemoglobin: 11.5 g/dL — ABNORMAL LOW (ref 12.0–15.0)
Immature Granulocytes: 5 %
Lymphocytes Relative: 14 %
Lymphs Abs: 2.3 10*3/uL (ref 0.7–4.0)
MCH: 25.5 pg — ABNORMAL LOW (ref 26.0–34.0)
MCHC: 32.3 g/dL (ref 30.0–36.0)
MCV: 78.9 fL — ABNORMAL LOW (ref 80.0–100.0)
Monocytes Absolute: 1 10*3/uL (ref 0.1–1.0)
Monocytes Relative: 7 %
Neutro Abs: 11.8 10*3/uL — ABNORMAL HIGH (ref 1.7–7.7)
Neutrophils Relative %: 72 %
Platelets: 406 10*3/uL — ABNORMAL HIGH (ref 150–400)
RBC: 4.51 MIL/uL (ref 3.87–5.11)
RDW: 15.9 % — ABNORMAL HIGH (ref 11.5–15.5)
WBC: 16 10*3/uL — ABNORMAL HIGH (ref 4.0–10.5)
nRBC: 0 % (ref 0.0–0.2)

## 2020-04-18 LAB — BRAIN NATRIURETIC PEPTIDE: B Natriuretic Peptide: 884.9 pg/mL — ABNORMAL HIGH (ref 0.0–100.0)

## 2020-04-18 LAB — URINALYSIS, COMPLETE (UACMP) WITH MICROSCOPIC
Bilirubin Urine: NEGATIVE
Glucose, UA: NEGATIVE mg/dL
Hgb urine dipstick: NEGATIVE
Ketones, ur: NEGATIVE mg/dL
Leukocytes,Ua: NEGATIVE
Nitrite: NEGATIVE
Protein, ur: NEGATIVE mg/dL
Specific Gravity, Urine: 1.019 (ref 1.005–1.030)
pH: 5 (ref 5.0–8.0)

## 2020-04-18 LAB — COMPREHENSIVE METABOLIC PANEL
ALT: 65 U/L — ABNORMAL HIGH (ref 0–44)
AST: 67 U/L — ABNORMAL HIGH (ref 15–41)
Albumin: 3.2 g/dL — ABNORMAL LOW (ref 3.5–5.0)
Alkaline Phosphatase: 104 U/L (ref 38–126)
Anion gap: 14 (ref 5–15)
BUN: 12 mg/dL (ref 8–23)
CO2: 18 mmol/L — ABNORMAL LOW (ref 22–32)
Calcium: 8.4 mg/dL — ABNORMAL LOW (ref 8.9–10.3)
Chloride: 85 mmol/L — ABNORMAL LOW (ref 98–111)
Creatinine, Ser: 0.69 mg/dL (ref 0.44–1.00)
GFR, Estimated: >60 mL/min (ref >60)
Glucose, Bld: 112 mg/dL — ABNORMAL HIGH (ref 70–99)
Potassium: 4.6 mmol/L (ref 3.5–5.1)
Sodium: 117 mmol/L — CL (ref 135–145)
Total Bilirubin: 1.1 mg/dL (ref 0.3–1.2)
Total Protein: 6.8 g/dL (ref 6.5–8.1)

## 2020-04-18 LAB — RESP PANEL BY RT PCR (RSV, FLU A&B, COVID)
Influenza A by PCR: NEGATIVE
Influenza B by PCR: NEGATIVE
Respiratory Syncytial Virus by PCR: NEGATIVE
SARS Coronavirus 2 by RT PCR: NEGATIVE

## 2020-04-18 LAB — TROPONIN I (HIGH SENSITIVITY)
Troponin I (High Sensitivity): 7 ng/L (ref <18)
Troponin I (High Sensitivity): 5 ng/L (ref <18)

## 2020-04-18 LAB — OSMOLALITY: Osmolality: 244 mOsm/kg — CL (ref 275–295)

## 2020-04-18 LAB — OSMOLALITY, URINE: Osmolality, Ur: 537 mOsm/kg (ref 300–900)

## 2020-04-18 LAB — TSH: TSH: 7.152 u[IU]/mL — ABNORMAL HIGH (ref 0.350–4.500)

## 2020-04-18 LAB — SODIUM, URINE, RANDOM: Sodium, Ur: <10 mmol/L

## 2020-04-18 MED ORDER — HYDRALAZINE HCL 20 MG/ML IJ SOLN: 5.0000 mg | INTRAMUSCULAR | Status: DC | PRN

## 2020-04-18 MED ORDER — ALBUTEROL SULFATE (2.5 MG/3ML) 0.083% IN NEBU: 2.5000 mg | INHALATION_SOLUTION | RESPIRATORY_TRACT | Status: DC | PRN

## 2020-04-18 MED ORDER — METOPROLOL SUCCINATE ER 50 MG PO TB24
50.0000 mg | ORAL_TABLET | Freq: Two times a day (BID) | ORAL | Status: DC
  Administered 2020-04-18 – 2020-04-24 (×13): 50 mg via ORAL
  Filled 2020-04-18 (×13): qty 1

## 2020-04-18 MED ORDER — FUROSEMIDE 10 MG/ML IJ SOLN
40.0000 mg | Freq: Once | INTRAMUSCULAR | Status: AC
  Administered 2020-04-18: 40 mg via INTRAVENOUS
  Filled 2020-04-18: qty 4

## 2020-04-18 MED ORDER — FOLIC ACID 1 MG PO TABS
1.0000 mg | ORAL_TABLET | Freq: Every day | ORAL | Status: DC
  Administered 2020-04-18 – 2020-04-24 (×7): 1 mg via ORAL
  Filled 2020-04-18 (×7): qty 1

## 2020-04-18 MED ORDER — ADULT MULTIVITAMIN W/MINERALS CH
1.0000 | ORAL_TABLET | Freq: Every day | ORAL | Status: DC
  Administered 2020-04-18 – 2020-04-24 (×7): 1 via ORAL
  Filled 2020-04-18 (×7): qty 1

## 2020-04-18 MED ORDER — LORAZEPAM 2 MG/ML IJ SOLN: 1.0000 mg | INTRAMUSCULAR | Status: DC | PRN

## 2020-04-18 MED ORDER — MIDODRINE HCL 5 MG PO TABS
2.5000 mg | ORAL_TABLET | Freq: Three times a day (TID) | ORAL | Status: DC
  Administered 2020-04-18 – 2020-04-24 (×18): 2.5 mg via ORAL
  Filled 2020-04-18 (×19): qty 1

## 2020-04-18 MED ORDER — HYDROCERIN EX CREA
1.0000 "application " | TOPICAL_CREAM | Freq: Two times a day (BID) | CUTANEOUS | Status: DC
  Administered 2020-04-18 – 2020-04-24 (×13): 1 via TOPICAL
  Filled 2020-04-18: qty 113

## 2020-04-18 MED ORDER — LEVOTHYROXINE SODIUM 50 MCG PO TABS
75.0000 ug | ORAL_TABLET | Freq: Every day | ORAL | Status: DC
  Administered 2020-04-19 – 2020-04-24 (×6): 75 ug via ORAL
  Filled 2020-04-18 (×6): qty 1

## 2020-04-18 MED ORDER — SODIUM CHLORIDE 0.9% FLUSH
3.0000 mL | Freq: Two times a day (BID) | INTRAVENOUS | Status: DC
  Administered 2020-04-18 – 2020-04-24 (×9): 3 mL via INTRAVENOUS

## 2020-04-18 MED ORDER — PANTOPRAZOLE SODIUM 40 MG PO TBEC
40.0000 mg | DELAYED_RELEASE_TABLET | Freq: Every day | ORAL | Status: DC
  Administered 2020-04-18 – 2020-04-24 (×7): 40 mg via ORAL
  Filled 2020-04-18 (×7): qty 1

## 2020-04-18 MED ORDER — AMIODARONE HCL 200 MG PO TABS
200.0000 mg | ORAL_TABLET | Freq: Every day | ORAL | Status: DC
  Administered 2020-04-18 – 2020-04-24 (×7): 200 mg via ORAL
  Filled 2020-04-18 (×7): qty 1

## 2020-04-18 MED ORDER — SODIUM CHLORIDE 0.9% FLUSH
3.0000 mL | INTRAVENOUS | Status: DC | PRN
  Administered 2020-04-21 – 2020-04-22 (×2): 3 mL via INTRAVENOUS

## 2020-04-18 MED ORDER — SODIUM CHLORIDE 0.9 % IV SOLN: INTRAVENOUS | Status: DC

## 2020-04-18 MED ORDER — ENSURE MAX PROTEIN PO LIQD
11.0000 [oz_av] | Freq: Two times a day (BID) | ORAL | Status: DC
  Filled 2020-04-18: qty 330

## 2020-04-18 MED ORDER — SODIUM CHLORIDE 0.9 % IV SOLN: 250.0000 mL | INTRAVENOUS | Status: DC | PRN

## 2020-04-18 MED ORDER — SODIUM CHLORIDE 1 G PO TABS
1.0000 g | ORAL_TABLET | Freq: Two times a day (BID) | ORAL | Status: DC
  Administered 2020-04-18 – 2020-04-19 (×3): 1 g via ORAL
  Filled 2020-04-18 (×4): qty 1

## 2020-04-18 MED ORDER — DM-GUAIFENESIN ER 30-600 MG PO TB12: 1.0000 | ORAL_TABLET | Freq: Two times a day (BID) | ORAL | Status: DC | PRN

## 2020-04-18 MED ORDER — APIXABAN 5 MG PO TABS
5.0000 mg | ORAL_TABLET | Freq: Two times a day (BID) | ORAL | Status: DC
  Administered 2020-04-18 – 2020-04-24 (×13): 5 mg via ORAL
  Filled 2020-04-18 (×13): qty 1

## 2020-04-18 MED ORDER — PROSOURCE PLUS PO LIQD
30.0000 mL | Freq: Two times a day (BID) | ORAL | Status: DC
  Administered 2020-04-18 – 2020-04-23 (×5): 30 mL via ORAL
  Filled 2020-04-18: qty 30

## 2020-04-18 NOTE — Progress Notes (Signed)
Writer contacted MD r/t DNR papers noted in chart upon admission. MD was asked by writer to change code status order from full code to DNR noted in chart. MD stated that the POA wishes was to have patient as a full code so the order stays. Writer removed DNR paper from chart and placed in trash bin for appropriate docs and purple band removed from wrist.

## 2020-04-18 NOTE — Consult Note (Addendum)
[facility] CONSULT NOTE    Date: 04/18/2020                  Patient Name:  [name]  MRN: [id number]  DOB: 26-Sep-1945  Age / Sex: 74 y.o., female         PCP: Mickel Fuchs, MD                 Service Requesting Consult: Hospitalist                 Reason for Consult: Hyponatremia            History of Present Illness: Patient is a 74 y.o. female with a PMHx of diverticulitis status post colectomy and ileostomy, CHF, hypertension, stroke, A. fib, GERD, hypothyroidism, seizure, IBS, hyponatremia, anemia, chronic respiratory failure with hypoxia on 2 L of oxygen by nasal cannula at night, who was admitted to [facility] on 04/18/2020 for evaluation of shortness of breath and chest tightness, started when her nasal cannula was off of her nose during the night.   Nephrology was consulted for the management of hyponatremia. Her Sodium on presentation to the ED was 117. She received IV Furosemide for hypervolemia while in ED. Her Sodium down to 116 this morning.  Medications: Outpatient medications: Medications Prior to Admission  Medication Sig Dispense Refill Last Dose  . acetaminophen (TYLENOL) 325 MG tablet Take 650 mg by mouth every 6 (six) hours as needed.   Past Week at Unknown time  . amiodarone (PACERONE) 200 MG tablet Take 1 tablet (200 mg total) by mouth daily.   04/17/2020 at Unknown time  . apixaban (ELIQUIS) 5 MG TABS tablet Take 1 tablet (5 mg total) by mouth 2 (two) times daily. 60 tablet 0 04/17/2020 at Unknown time  . diltiazem (CARDIZEM CD) 120 MG 24 hr capsule Take 1 capsule (120 mg total) by mouth daily. 30 capsule 0 04/17/2020 at Unknown time  . Ensure Max Protein (ENSURE MAX PROTEIN) LIQD Take 330 mLs (11 oz total) by mouth 2 (two) times daily.   04/17/2020 at Unknown time  . folic acid (FOLVITE) 1 MG tablet Take 1 mg by mouth daily. Per tube once daily     04/17/2020 at Unknown time  . furosemide (LASIX) 40 MG tablet Take 1 tablet (40 mg  total) by mouth daily. 30 tablet 1 04/17/2020 at Unknown time  . hydrocerin (EUCERIN) CREA Apply 1 application topically 2 (two) times daily. 113 g 0 04/17/2020 at Unknown time  . levothyroxine (SYNTHROID) 75 MCG tablet Take 75 mcg by mouth daily before breakfast.    04/17/2020 at Unknown time  . metoprolol succinate (TOPROL-XL) 50 MG 24 hr tablet Take 1 tablet (50 mg total) by mouth 2 (two) times daily. Take with or immediately following a meal. 60 tablet 0 04/17/2020 at 1700  . midodrine (PROAMATINE) 2.5 MG tablet Take 1 tablet (2.5 mg total) by mouth 3 (three) times daily with meals. 90 tablet 0 04/17/2020 at Unknown time  . Multiple Vitamin (MULTIVITAMIN WITH MINERALS) TABS tablet Take 1 tablet by mouth daily.   04/17/2020 at Unknown time  . omeprazole (PRILOSEC) 20 MG capsule Take 20 mg by mouth daily.   04/17/2020 at Unknown time  . potassium chloride (KLOR-CON) 10 MEQ tablet Take 10 mEq by mouth daily.   04/17/2020 at Unknown time  . hydrocortisone (ANUSOL-HC) 2.5 % rectal cream Apply 1 application topically 2 (two) times daily as needed. (Patient not taking: Reported  on 04/09/2020)       Current medications: Current Facility-Administered Medications  Medication Dose Route Frequency Provider Last Rate Last Admin  . (feeding supplement) PROSource Plus liquid 30 mL  30 mL Oral BID BM Lorretta Harp, MD   30 mL at 04/18/20 1319  . 0.9 %  sodium chloride infusion  250 mL Intravenous PRN Lorretta Harp, MD      . albuterol (PROVENTIL) (2.5 MG/3ML) 0.083% nebulizer solution 2.5 mg  2.5 mg Inhalation Q4H PRN Lorretta Harp, MD      . amiodarone (PACERONE) tablet 200 mg  200 mg Oral Daily Lorretta Harp, MD   200 mg at 04/18/20 1317  . apixaban (ELIQUIS) tablet 5 mg  5 mg Oral BID Lorretta Harp, MD   5 mg at 04/18/20 1317  . dextromethorphan-guaiFENesin (MUCINEX DM) 30-600 MG per 12 hr tablet 1 tablet  1 tablet Oral BID PRN Lorretta Harp, MD      . folic acid (FOLVITE) tablet 1 mg  1 mg Oral Daily Lorretta Harp, MD   1 mg  at 04/18/20 1317  . hydrALAZINE (APRESOLINE) injection 5 mg  5 mg Intravenous Q2H PRN Lorretta Harp, MD      . hydrocerin (EUCERIN) cream 1 application  1 application Topical BID Lorretta Harp, MD   1 application at 04/18/20 1319  . levothyroxine (SYNTHROID) tablet 75 mcg  75 mcg Oral Q0600 Lorretta Harp, MD      . LORazepam (ATIVAN) injection 1 mg  1 mg Intravenous Q2H PRN Lorretta Harp, MD      . metoprolol succinate (TOPROL-XL) 24 hr tablet 50 mg  50 mg Oral BID AC Lorretta Harp, MD      . midodrine (PROAMATINE) tablet 2.5 mg  2.5 mg Oral TID WC Lorretta Harp, MD      . multivitamin with minerals tablet 1 tablet  1 tablet Oral Daily Lorretta Harp, MD   1 tablet at 04/18/20 1317  . pantoprazole (PROTONIX) EC tablet 40 mg  40 mg Oral Daily Lorretta Harp, MD   40 mg at 04/18/20 1317  . sodium chloride flush (NS) 0.9 % injection 3 mL  3 mL Intravenous Q12H Lorretta Harp, MD   3 mL at 04/18/20 1038  . sodium chloride flush (NS) 0.9 % injection 3 mL  3 mL Intravenous PRN Lorretta Harp, MD      . sodium chloride tablet 1 g  1 g Oral BID WC Lorretta Harp, MD   1 g at 04/18/20 1129      Allergies: Allergies  Allergen Reactions  . Prednisone Hives  . Predicort [Prednisolone Acetate]     HIVES,      Past Medical History: Past Medical History:  Diagnosis Date  . Acute respiratory failure (HCC)    Secondary to aspiration pneumonia   . Altered mental status    Secondary to viral herpes simplex virus encephalitis  . Anemia   . Atrial fibrillation (HCC)   . Bilateral pneumonia   . CHF (congestive heart failure) (HCC)   . Dysphasia   . Encephalitis   . Hyperglycemia   . Hypertension   . Hyponatremia   . IBS (irritable bowel syndrome)   . Seizures (HCC)   . Stroke [facility])      Past Surgical History: Past Surgical History:  Procedure Laterality Date  . CHOLECYSTECTOMY    . COLECTOMY WITH COLOSTOMY CREATION/HARTMANN PROCEDURE N/A 03/12/2020   Procedure: COLECTOMY WITH COLOSTOMY CREATION/HARTMANN PROCEDURE;  Surgeon:  Campbell Lerner, MD;  Location: [facility]  ORS;  Service: General;  Laterality: N/A;  . COLONOSCOPY N/A 01/17/2020   Procedure: COLONOSCOPY;  Surgeon: Regis Bill, MD;  Location: [facility] ENDOSCOPY;  Service: Endoscopy;  Laterality: N/A;  . COLONOSCOPY WITH PROPOFOL N/A 12/31/2016   Procedure: COLONOSCOPY WITH PROPOFOL;  Surgeon: Scot Jun, MD;  Location: [facility] ENDOSCOPY;  Service: Endoscopy;  Laterality: N/A;  . ESOPHAGOGASTRODUODENOSCOPY (EGD) WITH PROPOFOL N/A 12/31/2016   Procedure: ESOPHAGOGASTRODUODENOSCOPY (EGD) WITH PROPOFOL;  Surgeon: Scot Jun, MD;  Location: [facility] ENDOSCOPY;  Service: Endoscopy;  Laterality: N/A;  . LAPAROTOMY N/A 03/12/2020   Procedure: EXPLORATORY LAPAROTOMY;  Surgeon: Campbell Lerner, MD;  Location: [facility] ORS;  Service: General;  Laterality: N/A;  . TONSILLECTOMY    . TUBAL LIGATION       Family History: Family History  Problem Relation Age of Onset  . Prostate cancer Father   . Breast cancer Maternal Aunt   . Breast cancer Maternal Aunt   . Cerebral palsy Son      Social History: Social History   Socioeconomic History  . Marital status: Widowed    Spouse name: Not on file  . Number of children: 2  . Years of education: Not on file  . Highest education level: Not on file  Occupational History  . Occupation: medical transcript  Tobacco Use  . Smoking status: Never Smoker  . Smokeless tobacco: Never Used  Vaping Use  . Vaping Use: Never used  Substance and Sexual Activity  . Alcohol use: No  . Drug use: No  . Sexual activity: Never  Other Topics Concern  . Not on file  Social History Narrative   Pt resides at [employer] [facility] at this time. Son is Power of [employer].   Social Determinants of Health   Financial Resource Strain:   . Difficulty of Paying Living Expenses: Not on file  Food Insecurity:   . Worried About Programme researcher, broadcasting/film/video in the Last Year: Not on file  . Ran Out of Food in the Last Year: Not on file   Transportation Needs:   . Lack of Transportation (Medical): Not on file  . Lack of Transportation (Non-Medical): Not on file  Physical Activity:   . Days of Exercise per Week: Not on file  . Minutes of Exercise per Session: Not on file  Stress:   . Feeling of Stress : Not on file  Social Connections:   . Frequency of Communication with Friends and Family: Not on file  . Frequency of Social Gatherings with Friends and Family: Not on file  . Attends Religious Services: Not on file  . Active Member of Clubs or Organizations: Not on file  . Attends Banker Meetings: Not on file  . Marital Status: Not on file  Intimate Partner Violence:   . Fear of Current or Ex-Partner: Not on file  . Emotionally Abused: Not on file  . Physically Abused: Not on file  . Sexually Abused: Not on file     Review of Systems: Review of Systems  Constitutional: Positive for malaise/fatigue.  Respiratory: Positive for cough and shortness of breath.   Cardiovascular: Positive for chest pain and orthopnea.  Psychiatric/Behavioral: The patient is nervous/anxious.     Vital Signs: Blood pressure 102/70, pulse 73, temperature 97.6 F (36.4 C), temperature source Oral, resp. rate 19, height 5\' 5"  (1.651 m), weight 66.4 kg, SpO2 100 %.  Weight trends: Filed Weights   04/18/20 0933  Weight: 66.4 kg    Physical Exam:  Physical Exam: General:  Resting in bed, In no acute distress  Head:  Normocephalic, atraumatic. Moist oral mucosal membranes  Eyes:  Anicteric  Lungs:   Clear to auscultation, normal effort  Heart:  S1S2 no rubs or gallops  Abdomen:   Soft, nontender, bowel sounds present  Extremities:  Trace peripheral edema.  Neurologic:  Awake, alert, following commands  Skin:  No acute lesions or rashes    Lab results: Basic Metabolic Panel: Recent Labs  Lab 04/18/20 0540 04/18/20 0844 04/18/20 1258  NA 117* 116* 120*  K 4.6 4.3 3.6  CL 85* 84* 88*  CO2 18* 20* 19*  GLUCOSE  112* 83 139*  BUN 12 12 13   CREATININE 0.69 0.75 0.75  CALCIUM 8.4* 8.5* 7.9*    Liver Function Tests: Recent Labs  Lab 04/18/20 0540  AST 67*  ALT 65*  ALKPHOS 104  BILITOT 1.1  PROT 6.8  ALBUMIN 3.2*   No results for input(s): LIPASE, AMYLASE in the last 168 hours. No results for input(s): AMMONIA in the last 168 hours.  CBC: Recent Labs  Lab 04/18/20 0540  WBC 16.0*  NEUTROABS 11.8*  HGB 11.5*  HCT 35.6*  MCV 78.9*  PLT 406*    Cardiac Enzymes: No results for input(s): CKTOTAL, CKMB, CKMBINDEX, TROPONINI in the last 168 hours.  BNP: Invalid input(s): POCBNP  CBG: No results for input(s): GLUCAP in the last 168 hours.  Microbiology: Results for orders placed or performed during the hospital encounter of 04/18/20  Resp Panel by RT PCR (RSV, Flu A&B, Covid) - Nasopharyngeal Swab     Status: None   Collection Time: 04/18/20  8:44 AM   Specimen: Nasopharyngeal Swab  Result Value Ref Range Status   SARS Coronavirus 2 by RT PCR NEGATIVE NEGATIVE Final    Comment: (NOTE) SARS-CoV-2 target nucleic acids are NOT DETECTED.  The SARS-CoV-2 RNA is generally detectable in upper respiratoy specimens during the acute phase of infection. The lowest concentration of SARS-CoV-2 viral copies this assay can detect is 131 copies/mL. A negative result does not preclude SARS-Cov-2 infection and should not be used as the sole basis for treatment or other patient management decisions. A negative result may occur with  improper specimen collection/handling, submission of specimen other than nasopharyngeal swab, presence of viral mutation(s) within the areas targeted by this assay, and inadequate number of viral copies (<131 copies/mL). A negative result must be combined with clinical observations, patient history, and epidemiological information. The expected result is Negative.  Fact Sheet for Patients:  https://www.moore.com/https://www.fda.gov/media/142436/download  Fact Sheet for Healthcare  Providers:  https://www.young.biz/https://www.fda.gov/media/142435/download  This test is no t yet approved or cleared by the [place] FDA and  has been authorized for detection and/or diagnosis of SARS-CoV-2 by FDA under an Emergency Use Authorization (EUA). This EUA will remain  in effect (meaning this test can be used) for the duration of the COVID-19 declaration under Section 564(b)(1) of the Act, 21 U.S.C. section 360bbb-3(b)(1), unless the authorization is terminated or revoked sooner.     Influenza A by PCR NEGATIVE NEGATIVE Final   Influenza B by PCR NEGATIVE NEGATIVE Final    Comment: (NOTE) The Xpert Xpress SARS-CoV-2/FLU/RSV assay is intended as an aid in  the diagnosis of influenza from Nasopharyngeal swab specimens and  should not be used as a sole basis for treatment. Nasal washings and  aspirates are unacceptable for Xpert Xpress SARS-CoV-2/FLU/RSV  testing.  Fact Sheet for Patients: https://www.moore.com/https://www.fda.gov/media/142436/download  Fact Sheet for Healthcare Providers: https://www.young.biz/https://www.fda.gov/media/142435/download  This test is not yet approved or cleared by the [place] and  has been authorized for detection and/or diagnosis of SARS-CoV-2 by  FDA under an Emergency Use Authorization (EUA). This EUA will remain  in effect (meaning this test can be used) for the duration of the  Covid-19 declaration under Section 564(b)(1) of the Act, 21  U.S.C. section 360bbb-3(b)(1), unless the authorization is  terminated or revoked.    Respiratory Syncytial Virus by PCR NEGATIVE NEGATIVE Final    Comment: (NOTE) Fact Sheet for Patients: https://www.moore.com/  Fact Sheet for Healthcare Providers: https://www.young.biz/  This test is not yet approved or cleared by the [place] FDA and  has been authorized for detection and/or diagnosis of SARS-CoV-2 by  FDA under an Emergency Use Authorization (EUA). This EUA will remain  in effect (meaning this  test can be used) for the duration of the  COVID-19 declaration under Section 564(b)(1) of the Act, 21 U.S.C.  section 360bbb-3(b)(1), unless the authorization is terminated or  revoked. Performed at [facility], [address] [place], [place] [address]     Coagulation Studies: No results for input(s): LABPROT, INR in the last 72 hours.  Urinalysis: Recent Labs    04/18/20 0540  COLORURINE YELLOW*  LABSPEC 1.019  PHURINE 5.0  GLUCOSEU NEGATIVE  HGBUR NEGATIVE  BILIRUBINUR NEGATIVE  KETONESUR NEGATIVE  PROTEINUR NEGATIVE  NITRITE NEGATIVE  LEUKOCYTESUR NEGATIVE      Imaging: DG Chest Portable 1 View  Result Date: 04/18/2020 CLINICAL DATA:  Chest pain and shortness of breath EXAM: PORTABLE CHEST 1 VIEW COMPARISON:  Seven days ago FINDINGS: Small to moderate bilateral pleural effusion. The lower lobes are partially obscured. Possible cephalized blood flow. No Kerley lines. Cardiomegaly. IMPRESSION: Cardiomegaly with small to moderate pleural effusions and mild vascular congestion. Electronically Signed   By: Marnee Spring M.D.   On: 04/18/2020 06:16      Assessment & Plan: Pt is a 74 y.o. female with a PMHX ofdiverticulitis status post colectomy and ileostomy, CHF, hypertension, stroke, A. fib, GERD, hypothyroidism, seizure, IBS, hyponatremia, anemia, chronic respiratory failure with hypoxia on 2 L of oxygen by nasal cannula at night,  , was admitted to [facility] on 04/18/2020 with c/o shortness of breath and chest tightness, started when her nasal cannula was off of her nose during the night. Nephrology was consulted for the management of hyponatremia.  #Hyponatremia Potentially hypovolemic hyponatremia Her Sodium on presentation to the ED was 117.  She received IV Furosemide for hypervolemia while in ED.  Her Sodium level was down to 116 this morning,improved to 120 now Patient stays asymptomatic,no reported nausea,vomiting,headache, drowsiness,  irritability,muscle spasms or cramps.  No acute mental status changes, has generalized fatigue.  TSH 7.152,elevated, h/o hypothyroidism,potential cause of hyponatremia Patient is on Levothyroxine 75 mcg daily S.Osmolarity 244 Urine Osmolarity 537 Urine Sodium <10  Patient appears euvolemic at the time of our assessment She is on Sodium Chloride tablets 1gm BID  We will continue monitoring S.Sodium levels  closely  #Acute on chronic diastolic congestive heart failure Patient is on 2 L of oxygen via nasal cannula Respirations unlabored,reports improvement in SOB and chest tightness BNP 884.9 Chest Xray on presentation IMPRESSION: Cardiomegaly with small to moderate pleural effusions and mild vascular congestion.  #Hypertension Blood Pressure readings within acceptable range Patient is on Toprol XL Hydralazine as needed

## 2020-04-18 NOTE — H&P (Addendum)
History and Physical    [name] DOB: 1946/05/18 DOA: 04/18/2020  Referring MD/NP/PA:   PCP: Mickel Fuchs, MD   Patient coming from:  The patient is coming from SNF.  At baseline, pt is dependent for most of ADL.        Chief Complaint: SOB  HPI: [name] is a 74 y.o. 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  Pt was recently hospitalized from 11/7-11/12 due to CHF exacerbation. She states that her SOB had improved significantly, but she continues to have swelling in lower extremities. Pt states that she normally is on 2 L oxygen at night.  She woke up this morning and her oxygen tubing had fallen off her face and she could not find it. She developed SOB and chest tightness. She states that she feels better after O2 is placed back.  She still has some shortness of breath and mild cough currently, denies chest pain, fever or chills.  No nausea, vomiting, diarrhea or abdominal pain.  Patient states that she has urinary frequency which she attributes to Lasix use, but no dysuria or burning on urination.  ED Course: pt was found to have BNP 884, pending COVID-19 PCR, troponin level 7, sodium 117, negative urinalysis, renal function okay, temperature normal, blood pressure 102/71, heart rate 80, RR 20, oxygen saturation 96% on 2 L oxygen.  Chest x-ray showed cardiomegaly, vascular congestion and small-moderate pleural effusion.  Patient is admitted to progressive bed as inpatient.   Review of Systems:   General: no fevers, chills, has poor appetite, has fatigue HEENT: no blurry vision, hearing changes or sore throat Respiratory: has dyspnea, coughing, no wheezing CV: no chest pain, no  palpitations GI: no nausea, vomiting, abdominal pain, diarrhea, constipation GU: no dysuria, burning on urination, has increased urinary frequency, no hematuria  Ext: has leg edema Neuro: no unilateral weakness, numbness, or tingling, no vision change or hearing loss Skin: no rash, no skin tear. MSK: No muscle spasm, no deformity, no limitation of range of movement in spin Heme: No easy bruising.  Travel history: No recent long distant travel.  Allergy:  Allergies  Allergen Reactions  . Prednisone Hives  . Predicort [Prednisolone Acetate]     HIVES,    Past Medical History:  Diagnosis Date  . Acute respiratory failure (HCC)    Secondary to aspiration pneumonia   . Altered mental status    Secondary to viral herpes simplex virus encephalitis  . Anemia   . Atrial fibrillation (HCC)   . Bilateral pneumonia   . CHF (congestive heart failure) (HCC)   . Dysphasia   . Encephalitis   . Hyperglycemia   . Hypertension   . Hyponatremia   . IBS (irritable bowel syndrome)   . Seizures (HCC)   . Stroke [facility])     Past Surgical History:  Procedure Laterality Date  . CHOLECYSTECTOMY    . COLECTOMY WITH COLOSTOMY CREATION/HARTMANN PROCEDURE N/A 03/12/2020   Procedure: COLECTOMY WITH COLOSTOMY CREATION/HARTMANN PROCEDURE;  Surgeon: Campbell Lerner, MD;  Location: [facility] ORS;  Service: General;  Laterality: N/A;  . COLONOSCOPY N/A 01/17/2020   Procedure: COLONOSCOPY;  Surgeon: Regis Bill, MD;  Location: [facility] ENDOSCOPY;  Service: Endoscopy;  Laterality: N/A;  . COLONOSCOPY WITH PROPOFOL N/A 12/31/2016  ProcedScot JunPY WITH PROPOFOL;  Surgeon: Elliott, Robert T, MD;  Location: [facility] ENDOSCOPY;  Service: Endoscopy;  Laterality: N/A;  . ESOPHAGOGASTRODUODENOSCOPY (EGD) WITH PROPOFOL N/A 12/31/2016   Procedure: ESOPHAGOGASTRODUODENOSCOPY (EGD) WITH PROPOFOL;  Surgeon: Scot Jun, MD;  Location: [facility] ENDOSCOPY;  Service: Endoscopy;  Laterality: N/A;  . LAPAROTOMY N/A  03/12/2020   Procedure: EXPLORATORY LAPAROTOMY;  Surgeon: Campbell Lerner, MD;  Location: [facility] ORS;  Service: General;  Laterality: N/A;  . TONSILLECTOMY    . TUBAL LIGATION      Social History:  reports that she has never smoked. She has never used smokeless tobacco. She reports that she does not drink alcohol and does not use drugs.  Family History:  Family History  Problem Relation Age of Onset  . Prostate cancer Father   . Breast cancer Maternal Aunt   . Breast cancer Maternal Aunt   . Cerebral palsy Son      Prior to Admission medications   Medication Sig Start Date End Date Taking? Authorizing Provider  acetaminophen (TYLENOL) 325 MG tablet Take 650 mg by mouth every 6 (six) hours as needed.    [provider]  amiodarone (PACERONE) 200 MG tablet Take 1 tablet (200 mg total) by mouth daily. 03/20/20   Darlin Priestly, MD  apixaban (ELIQUIS) 5 MG TABS tablet Take 1 tablet (5 mg total) by mouth 2 (two) times daily. 03/20/19   Altamese Dilling, MD  diltiazem (CARDIZEM CD) 120 MG 24 hr capsule Take 1 capsule (120 mg total) by mouth daily. 04/03/20   Alford Highland, MD  Ensure Max Protein (ENSURE MAX PROTEIN) LIQD Take 330 mLs (11 oz total) by mouth 2 (two) times daily. 03/20/20   Darlin Priestly, MD  folic acid (FOLVITE) 1 MG tablet Take 1 mg by mouth daily. Per tube once daily      [provider]  furosemide (LASIX) 40 MG tablet Take 1 tablet (40 mg total) by mouth daily. 04/14/20   Wouk, Wilfred Curtis, MD  hydrocerin (EUCERIN) CREA Apply 1 application topically 2 (two) times daily. 04/03/20   Alford Highland, MD  hydrocortisone (ANUSOL-HC) 2.5 % rectal cream Apply 1 application topically 2 (two) times daily as needed. Patient not taking: Reported on 04/09/2020 02/28/20   [provider]  levothyroxine (SYNTHROID) 75 MCG tablet Take 75 mcg by mouth daily before breakfast.     [provider]  metoprolol succinate (TOPROL-XL) 50 MG 24 hr tablet Take 1  tablet (50 mg total) by mouth 2 (two) times daily. Take with or immediately following a meal. 04/03/20   Alford Highland, MD  midodrine (PROAMATINE) 2.5 MG tablet Take 1 tablet (2.5 mg total) by mouth 3 (three) times daily with meals. 04/03/20   Alford Highland, MD  Multiple Vitamin (MULTIVITAMIN WITH MINERALS) TABS tablet Take 1 tablet by mouth daily. 03/20/20   Darlin Priestly, MD  omeprazole (PRILOSEC) 20 MG capsule Take 20 mg by mouth daily.    [provider]  potassium chloride (KLOR-CON) 10 MEQ tablet Take 10 mEq by mouth daily.    [provider]    Physical Exam: Vitals:   04/18/20 0537 04/18/20 0700 04/18/20 0730 04/18/20 0815  BP: 114/86 102/71 107/80 100/76  Pulse: 74 80 81 84  Resp: (!) 21  Temp: (!) 97.5 F (36.4 C)     SpO2: 99% 96% 97% 97%   General: Not in acute distress HEENT:       Eyes: PERRL, EOMI, no scleral icterus.  ENT: No discharge from the ears and nose, no pharynx injection, no tonsillar enlargement.        Neck: positive JVD, no bruit, no mass felt. Heme: No neck lymph node enlargement. Cardiac: S1/S2, RRR, No murmurs, No gallops or rubs. Respiratory: Chest fine crackles bilaterally GI: Soft, nondistended, nontender, no rebound pain, no organomegaly, BS present. S/p of colectomy. GU: No hematuria Ext: has 1+ pitting leg edema bilaterally. 1+DP/PT pulse bilaterally. Musculoskeletal: No joint deformities, No joint redness or warmth, no limitation of ROM in spin. Skin: No rashes.  Neuro: Alert, oriented X3, cranial nerves II-XII grossly intact, moves all extremities normally.  Psych: Patient is not psychotic, no suicidal or hemocidal ideation.  Labs on Admission: I have personally reviewed following labs and imaging studies  CBC: Recent Labs  Lab 04/18/20 0540  WBC 16.0*  NEUTROABS 11.8*  HGB 11.5*  HCT 35.6*  MCV 78.9*  PLT 406*   Basic Metabolic Panel: Recent Labs  Lab 04/12/20 0515 04/13/20 0501 04/18/20 0540   NA 130* 126* 117*  K 3.9 4.2 4.6  CL 93* 91* 85*  CO2 27 25 18*  GLUCOSE 95 99 112*  BUN 13 14 12   CREATININE 0.63 0.76 0.69  CALCIUM 8.3* 8.6* 8.4*   GFR: Estimated Creatinine Clearance: 53.8 mL/min (by C-G formula based on SCr of 0.69 mg/dL). Liver Function Tests: Recent Labs  Lab 04/18/20 0540  AST 67*  ALT 65*  ALKPHOS 104  BILITOT 1.1  PROT 6.8  ALBUMIN 3.2*   No results for input(s): LIPASE, AMYLASE in the last 168 hours. No results for input(s): AMMONIA in the last 168 hours. Coagulation Profile: No results for input(s): INR, PROTIME in the last 168 hours. Cardiac Enzymes: No results for input(s): CKTOTAL, CKMB, CKMBINDEX, TROPONINI in the last 168 hours. BNP (last 3 results) No results for input(s): PROBNP in the last 8760 hours. HbA1C: No results for input(s): HGBA1C in the last 72 hours. CBG: No results for input(s): GLUCAP in the last 168 hours. Lipid Profile: No results for input(s): CHOL, HDL, LDLCALC, TRIG, CHOLHDL, LDLDIRECT in the last 72 hours. Thyroid Function Tests: No results for input(s): TSH, T4TOTAL, FREET4, T3FREE, THYROIDAB in the last 72 hours. Anemia Panel: No results for input(s): VITAMINB12, FOLATE, FERRITIN, TIBC, IRON, RETICCTPCT in the last 72 hours. Urine analysis:    Component Value Date/Time   COLORURINE YELLOW (A) 04/18/2020 0540   APPEARANCEUR TURBID (A) 04/18/2020 0540   APPEARANCEUR Clear 08/29/2011 0037   LABSPEC 1.019 04/18/2020 0540   LABSPEC 1.009 08/29/2011 0037   PHURINE 5.0 04/18/2020 0540   GLUCOSEU NEGATIVE 04/18/2020 0540   GLUCOSEU Negative 08/29/2011 0037   HGBUR NEGATIVE 04/18/2020 0540   BILIRUBINUR NEGATIVE 04/18/2020 0540   BILIRUBINUR Negative 08/29/2011 0037   KETONESUR NEGATIVE 04/18/2020 0540   PROTEINUR NEGATIVE 04/18/2020 0540   UROBILINOGEN 0.2 01/17/2011 0037   NITRITE NEGATIVE 04/18/2020 0540   LEUKOCYTESUR NEGATIVE 04/18/2020 0540   LEUKOCYTESUR 1+ 08/29/2011 0037   Sepsis  Labs: @LABRCNTIP (procalcitonin:4,lacticidven:4) ) Recent Results (from the past 240 hour(s))  Resp Panel by RT PCR (RSV, Flu A&B, Covid) - Nasopharyngeal Swab     Status: None   Collection Time: 04/09/20  1:02 AM   Specimen: Nasopharyngeal Swab  Result Value Ref Range Status   SARS Coronavirus 2 by RT PCR NEGATIVE NEGATIVE Final    Comment: (NOTE) SARS-CoV-2 target nucleic acids are NOT DETECTED.  The SARS-CoV-2 RNA is generally detectable in upper respiratoy specimens during the acute phase of infection.  The lowest concentration of SARS-CoV-2 viral copies this assay can detect is 131 copies/mL. A negative result does not preclude SARS-Cov-2 infection and should not be used as the sole basis for treatment or other patient management decisions. A negative result may occur with  improper specimen collection/handling, submission of specimen other than nasopharyngeal swab, presence of viral mutation(s) within the areas targeted by this assay, and inadequate number of viral copies (<131 copies/mL). A negative result must be combined with clinical observations, patient history, and epidemiological information. The expected result is Negative.  Fact Sheet for Patients:  https://www.moore.com/https://www.fda.gov/media/142436/download  Fact Sheet for Healthcare Providers:  https://www.young.biz/https://www.fda.gov/media/142435/download  This test is no t yet approved or cleared by the [place] FDA and  has been authorized for detection and/or diagnosis of SARS-CoV-2 by FDA under an Emergency Use Authorization (EUA). This EUA will remain  in effect (meaning this test can be used) for the duration of the COVID-19 declaration under Section 564(b)(1) of the Act, 21 U.S.C. section 360bbb-3(b)(1), unless the authorization is terminated or revoked sooner.     Influenza A by PCR NEGATIVE NEGATIVE Final   Influenza B by PCR NEGATIVE NEGATIVE Final    Comment: (NOTE) The Xpert Xpress SARS-CoV-2/FLU/RSV assay is intended as an aid  in  the diagnosis of influenza from Nasopharyngeal swab specimens and  should not be used as a sole basis for treatment. Nasal washings and  aspirates are unacceptable for Xpert Xpress SARS-CoV-2/FLU/RSV  testing.  Fact Sheet for Patients: https://www.moore.com/https://www.fda.gov/media/142436/download  Fact Sheet for Healthcare Providers: https://www.young.biz/https://www.fda.gov/media/142435/download  This test is not yet approved or cleared by the [place] FDA and  has been authorized for detection and/or diagnosis of SARS-CoV-2 by  FDA under an Emergency Use Authorization (EUA). This EUA will remain  in effect (meaning this test can be used) for the duration of the  Covid-19 declaration under Section 564(b)(1) of the Act, 21  U.S.C. section 360bbb-3(b)(1), unless the authorization is  terminated or revoked.    Respiratory Syncytial Virus by PCR NEGATIVE NEGATIVE Final    Comment: (NOTE) Fact Sheet for Patients: https://www.moore.com/https://www.fda.gov/media/142436/download  Fact Sheet for Healthcare Providers: https://www.young.biz/https://www.fda.gov/media/142435/download  This test is not yet approved or cleared by the [place] FDA and  has been authorized for detection and/or diagnosis of SARS-CoV-2 by  FDA under an Emergency Use Authorization (EUA). This EUA will remain  in effect (meaning this test can be used) for the duration of the  COVID-19 declaration under Section 564(b)(1) of the Act, 21 U.S.C.  section 360bbb-3(b)(1), unless the authorization is terminated or  revoked. Performed at [facility], [address] [place], [place] [address]   Respiratory Panel by RT PCR (Flu A&B, Covid) - Nasopharyngeal Swab     Status: None   Collection Time: 04/13/20 11:46 AM   Specimen: Nasopharyngeal Swab  Result Value Ref Range Status   SARS Coronavirus 2 by RT PCR NEGATIVE NEGATIVE Final    Comment: (NOTE) SARS-CoV-2 target nucleic acids are NOT DETECTED.  The SARS-CoV-2 RNA is generally detectable in upper respiratoy specimens  during the acute phase of infection. The lowest concentration of SARS-CoV-2 viral copies this assay can detect is 131 copies/mL. A negative result does not preclude SARS-Cov-2 infection and should not be used as the sole basis for treatment or other patient management decisions. A negative result may occur with  improper specimen collection/handling, submission of specimen other than nasopharyngeal swab, presence of viral mutation(s) within the areas targeted by this assay, and inadequate number of  viral copies (<131 copies/mL). A negative result must be combined with clinical observations, patient history, and epidemiological information. The expected result is Negative.  Fact Sheet for Patients:  https://www.moore.com/  Fact Sheet for Healthcare Providers:  https://www.young.biz/  This test is no t yet approved or cleared by the [place] FDA and  has been authorized for detection and/or diagnosis of SARS-CoV-2 by FDA under an Emergency Use Authorization (EUA). This EUA will remain  in effect (meaning this test can be used) for the duration of the COVID-19 declaration under Section 564(b)(1) of the Act, 21 U.S.C. section 360bbb-3(b)(1), unless the authorization is terminated or revoked sooner.     Influenza A by PCR NEGATIVE NEGATIVE Final   Influenza B by PCR NEGATIVE NEGATIVE Final    Comment: (NOTE) The Xpert Xpress SARS-CoV-2/FLU/RSV assay is intended as an aid in  the diagnosis of influenza from Nasopharyngeal swab specimens and  should not be used as a sole basis for treatment. Nasal washings and  aspirates are unacceptable for Xpert Xpress SARS-CoV-2/FLU/RSV  testing.  Fact Sheet for Patients: https://www.moore.com/  Fact Sheet for Healthcare Providers: https://www.young.biz/  This test is not yet approved or cleared by the [place] FDA and  has been authorized for detection and/or  diagnosis of SARS-CoV-2 by  FDA under an Emergency Use Authorization (EUA). This EUA will remain  in effect (meaning this test can be used) for the duration of the  Covid-19 declaration under Section 564(b)(1) of the Act, 21  U.S.C. section 360bbb-3(b)(1), unless the authorization is  terminated or revoked. Performed at [facility], [address] [place], [place] [address]      Radiological Exams on Admission: DG Chest Portable 1 View  Result Date: 04/18/2020 CLINICAL DATA:  Chest pain and shortness of breath EXAM: PORTABLE CHEST 1 VIEW COMPARISON:  Seven days ago FINDINGS: Small to moderate bilateral pleural effusion. The lower lobes are partially obscured. Possible cephalized blood flow. No Kerley lines. Cardiomegaly. IMPRESSION: Cardiomegaly with small to moderate pleural effusions and mild vascular congestion. Electronically Signed   By: Marnee Spring M.D.   On: 04/18/2020 06:16     EKG: I have personally reviewed.  Atrial fibrillation, QTc 544, low voltage, nonspecific T wave change    Assessment/Plan Principal Problem:   Acute on chronic diastolic CHF (congestive heart failure) (HCC) Active Problems:   Chronic respiratory failure with hypoxia (HCC)   Hypertension   Stroke (HCC)   Hypothyroidism   Hyponatremia   Atrial fibrillation, chronic (HCC)   Leukocytosis   GERD (gastroesophageal reflux disease)   Acute on chronic diastolic CHF and chronic respiratory failure with hypoxia: Patient has bilateral leg edema, elevated BNP 884, chest x-ray showed cardiomegaly and vascular congestion, clinically consistent with CHF exacerbation.  2D echo on 12/06/2019 showed EF of 55 to 60%.  Patient has hyponatremia with sodium 117.  This is a difficult situation since patient needs IV diuretics.  -Will admit to progressive unit as inpatient -careful use IV Lasix: pt received 40 mg of lasix in ED. Will check BMP to see Na level before giving more lasix -Daily  weights -strict I/O's -will not order Low salt diet due to hyponatremia -Fluid restriction -Obtain REDs Vest reading  Hyponatremia: Presumed edema 117. Patient has hypervolemic hyponatremia.  Ideal treatment would be starting hypertonic saline and IV diuretics, which will need central line placement and is invasive. Since pt's mental status is okay. Will check Na level by BMP first. If Na level is improving, will  not start invasive protocol. Will use IV lasix carefull and f/u BMP q6h.  - Will check urine sodium, urine osmolality, serum osmolality. - start oral sodium chloride 1 g twice daily - check TSH - Fluid restriction - f/u by BMP q6h - avoid over correction too fast due to risk of central pontine myelinolysis - Dr. Cherylann Ratel of renal is consulted.  Atrial fibrillation (HCC) -Continue metoprolol, amiodarone -On Eliquis -hold cardizem due to soft Bp  Hypertension -Hydralazine as needed -Continue metoprolol  Stroke [facility]) -Patient is Eliquis for A. Fib  Hypothyroidism: TSH 7.474 on 04/09/20 -Synthroid -will check TSH level  Leukocytosis: No fever.  No source of infection identified.  Likely reactive. -f/u CBC  -get Blood culture  GERD (gastroesophageal reflux disease) -Protonix         DVT ppx: on Eliquis Code Status: full code (pt has DNR document from facility, but per her son who is the POA, pt should be full code. I ordered full code) Family Communication:  Yes, patient's son by phone Disposition Plan:  Anticipate discharge back to previous SNF environment Consults called: Dr. Cherylann Ratel of renal Admission status:  progressive as inpt      Status is: Inpatient  Remains inpatient appropriate because:Inpatient level of care appropriate due to severity of illness.  Patient has multiple comorbidities, now presents with acute on chronic dCHF exacerbation.  Patient also has hyponatremia sodium 117, leukocytosis.  Her presentation is highly complicated.  Patient is  at high risk of deteriorating.  Will need to be treated in the hospital for at least 2 days.   Dispo: The patient is from: Home              Anticipated d/c is to: Home              Anticipated d/c date is: 2 days              Patient currently is not medically stable to d/c.           Date of Service 04/18/2020    Lorretta Harp [facility]   If 7PM-7AM, please contact night-coverage [email or web address] 04/18/2020, 9:22 AM

## 2020-04-18 NOTE — ED Notes (Signed)
Admitting MD at bedside.

## 2020-04-18 NOTE — Progress Notes (Signed)
Patient admitted to unit to room 238 at this time accompanied by transport aide with some personal belongings noted via strecther. SR on tele verified with tele monitor tech. A/Ox4 upon initial assessment, able to verbalize needs. VSS. On 2L when admitted to unit but mainly requires a little oxygen at night. Saturations WNL. No CP, SOB or distress noted. Purewick in place. Patient oriented to room and call bell/remote accessories. Bed alarm in place. Call bell in reach. Will continue to monitor patient.

## 2020-04-18 NOTE — ED Notes (Signed)
Date and time results received: 04/18/20 7:21 AM  (use smartphrase ".now" to insert current time)  Test: Sodium  Critical Value: 117  Name of Provider Notified: Dr. Marisa Severin   Orders Received? Or Actions Taken?: No new orders at this time

## 2020-04-18 NOTE — ED Provider Notes (Signed)
[facility] Emergency Department Provider Note  ____________________________________________  Time seen: Approximately 5:57 AM  I have reviewed the triage vital signs and the nursing notes.   HISTORY  Chief Complaint Shortness of Breath   HPI [name] is a 74 y.o. female the history of diastolic CHF, A. Fib/ flutter, anemia, hypertension, CVA, seizures who presents for evaluation of shortness of breath.  Patient reports that she uses oxygen to sleep.  She woke up this morning and her oxygen tubing had fallen off her face and she could not find it. She had SOB and chest tightness and called the nurse at the SNF who called 911. After being placed back on O2, patient reports that her symptoms resolved. She has no CP or SOB at this time.    Of note patient was discharged from the hospital 5 days ago after being admitted with a CHF exacerbation.  She reports that she feels markedly improved.  She denies orthopnea, cough, fever.  She does continue to report swelling of her lower extremities.  Past Medical History:  Diagnosis Date  . Acute respiratory failure (HCC)    Secondary to aspiration pneumonia   . Altered mental status    Secondary to viral herpes simplex virus encephalitis  . Anemia   . Atrial fibrillation (HCC)   . Bilateral pneumonia   . CHF (congestive heart failure) (HCC)   . Dysphasia   . Encephalitis   . Hyperglycemia   . Hypertension   . Hyponatremia   . IBS (irritable bowel syndrome)   . Seizures (HCC)   . Stroke [facility](HCC)     Patient Active Problem List   Diagnosis Date Noted  . Acute CHF (congestive heart failure) (HCC) 04/09/2020  . Nocturnal hypoxia   . Atrial fibrillation, chronic (HCC)   . Weakness   . Severe sepsis (HCC) 03/29/2020  .  Possible Abscess of postoperative wound of abdominal wall 03/29/2020  . S/P laparotomy 03/12/20  03/29/2020  . History of CVA (cerebrovascular accident) 03/29/2020  . Hypotension   .  Malnutrition of moderate degree 03/19/2020  . Hyponatremia 03/12/2020  . Chronic diastolic heart failure (HCC) 03/12/2020  . Large bowel obstruction (HCC) 03/12/2020  . Abdominal distention 03/11/2020  . Iron deficiency anemia 12/20/2019  . Acute on chronic diastolic CHF (congestive heart failure) (HCC) 12/04/2019  . Atrial fibrillation with RVR (HCC)   . Hypertension   . Stroke (HCC)   . Hypothyroidism   . Acute respiratory failure with hypoxia (HCC)   . Acute congestive heart failure (HCC) 03/17/2019  . Acute gastroenteritis 07/05/2017  . Acute respiratory failure (HCC) 03/27/2011    Past Surgical History:  Procedure Laterality Date  . CHOLECYSTECTOMY    . COLECTOMY WITH COLOSTOMY CREATION/HARTMANN PROCEDURE N/A 03/12/2020   Procedure: COLECTOMY WITH COLOSTOMY CREATION/HARTMANN PROCEDURE;  Surgeon: Campbell Lernerodenberg, Denny, MD;  Location: [facility] ORS;  Service: General;  Laterality: N/A;  . COLONOSCOPY N/A 01/17/2020   Procedure: COLONOSCOPY;  Surgeon: Regis BillLocklear, Cameron T, MD;  Location: [facility] ENDOSCOPY;  Service: Endoscopy;  Laterality: N/A;  . COLONOSCOPY WITH PROPOFOL N/A 12/31/2016   Procedure: COLONOSCOPY WITH PROPOFOL;  Surgeon: Scot JunElliott, Robert T, MD;  [facility];  Service: Endoscopy;  Laterality: N/A;  . ESOPHAGOGASTRODUODENOSCOPY (EGD) WITH PROPOFOL N/A 12/31/2016   Procedure: ESOPHAGOGASTRODUODENOSCOPY (EGD) WITH PROPOFOL;  Surgeon: Scot JunElliott, Robert T, MD;  [facility];  Service: Endoscopy;  Laterality: N/A;  . LAPAROTOMY N/A 03/12/2020   Procedure: EXPLORATORY LAPAROTOMY;  Surgeon: Campbell Lernerodenberg, Denny, MD;  Location: [facility] ORS;  Service: General;  Laterality: N/A;  . TONSILLECTOMY    . TUBAL LIGATION      Prior to Admission medications   Medication Sig Start Date End Date Taking? Authorizing Provider  acetaminophen (TYLENOL) 325 MG tablet Take 650 mg by mouth every 6 (six) hours as needed.    [provider]  amiodarone (PACERONE) 200 MG tablet Take 1  tablet (200 mg total) by mouth daily. 03/20/20   Darlin Priestly, MD  apixaban (ELIQUIS) 5 MG TABS tablet Take 1 tablet (5 mg total) by mouth 2 (two) times daily. 03/20/19   Altamese Dilling, MD  diltiazem (CARDIZEM CD) 120 MG 24 hr capsule Take 1 capsule (120 mg total) by mouth daily. 04/03/20   Alford Highland, MD  Ensure Max Protein (ENSURE MAX PROTEIN) LIQD Take 330 mLs (11 oz total) by mouth 2 (two) times daily. 03/20/20   Darlin Priestly, MD  folic acid (FOLVITE) 1 MG tablet Take 1 mg by mouth daily. Per tube once daily      [provider]  furosemide (LASIX) 40 MG tablet Take 1 tablet (40 mg total) by mouth daily. 04/14/20   Wouk, Wilfred Curtis, MD  hydrocerin (EUCERIN) CREA Apply 1 application topically 2 (two) times daily. 04/03/20   Alford Highland, MD  hydrocortisone (ANUSOL-HC) 2.5 % rectal cream Apply 1 application topically 2 (two) times daily as needed. Patient not taking: Reported on 04/09/2020 02/28/20   [provider]  levothyroxine (SYNTHROID) 75 MCG tablet Take 75 mcg by mouth daily before breakfast.     [provider]  metoprolol succinate (TOPROL-XL) 50 MG 24 hr tablet Take 1 tablet (50 mg total) by mouth 2 (two) times daily. Take with or immediately following a meal. 04/03/20   Alford Highland, MD  midodrine (PROAMATINE) 2.5 MG tablet Take 1 tablet (2.5 mg total) by mouth 3 (three) times daily with meals. 04/03/20   Alford Highland, MD  Multiple Vitamin (MULTIVITAMIN WITH MINERALS) TABS tablet Take 1 tablet by mouth daily. 03/20/20   Darlin Priestly, MD  omeprazole (PRILOSEC) 20 MG capsule Take 20 mg by mouth daily.    [provider]  potassium chloride (KLOR-CON) 10 MEQ tablet Take 10 mEq by mouth daily.    [provider]    Allergies Prednisone and Predicort [prednisolone acetate]  Family History  Problem Relation Age of Onset  . Prostate cancer Father   . Breast cancer Maternal Aunt   . Breast cancer Maternal Aunt   . Cerebral  palsy Son     Social History Social History   Tobacco Use  . Smoking status: Never Smoker  . Smokeless tobacco: Never Used  Vaping Use  . Vaping Use: Never used  Substance Use Topics  . Alcohol use: No  . Drug use: No    Review of Systems  Constitutional: Negative for fever. Eyes: Negative for visual changes. ENT: Negative for sore throat. Neck: No neck pain  Cardiovascular: Negative for chest pain. + Chest tightness Respiratory: + shortness of breath. Gastrointestinal: Negative for abdominal pain, vomiting or diarrhea. Genitourinary: Negative for dysuria. Musculoskeletal: Negative for back pain. Skin: Negative for rash. Neurological: Negative for headaches, weakness or numbness. Psych: No SI or HI  ____________________________________________   PHYSICAL EXAM:  VITAL SIGNS: ED Triage Vitals  Enc Vitals Group     BP 04/18/20 0537 114/86     Pulse Rate 04/18/20 0537 74     Resp 04/18/20 0537 19     Temp 04/18/20  3762 (!) 97.5 F (36.4 C)     Temp src --      SpO2 04/18/20 0534 94 %     Weight --      Height --      Head Circumference --      Peak Flow --      Pain Score --      Pain Loc --      Pain Edu? --      Excl. in GC? --     Constitutional: Alert and oriented. Well appearing and in no apparent distress. HEENT:      Head: Normocephalic and atraumatic.         Eyes: Conjunctivae are normal. Sclera is non-icteric.       Mouth/Throat: Mucous membranes are moist.       Neck: Supple with no signs of meningismus. Cardiovascular: Irregular rhythm with normal rate. No murmurs, gallops, or rubs. 2+ symmetrical distal pulses are present in all extremities.  Respiratory: Normal respiratory effort. Lungs are clear to auscultation bilaterally. No wheezes, crackles, or rhonchi.  Gastrointestinal: Soft, non tender, and non distended. Musculoskeletal: 2+ pitting edema bilaterally Neurologic: Normal speech and language. Face is symmetric. Moving all extremities.  No gross focal neurologic deficits are appreciated. Skin: Skin is warm, dry and intact. No rash noted. Psychiatric: Mood and affect are normal. Speech and behavior are normal.  ____________________________________________   LABS (all labs ordered are listed, but only abnormal results are displayed)  Labs Reviewed  CBC WITH DIFFERENTIAL/PLATELET - Abnormal; Notable for the following components:      Result Value   WBC 16.0 (*)    Hemoglobin 11.5 (*)    HCT 35.6 (*)    MCV 78.9 (*)    MCH 25.5 (*)    RDW 15.9 (*)    Platelets 406 (*)    Neutro Abs 11.8 (*)    Abs Immature Granulocytes 0.72 (*)    All other components within normal limits  BRAIN NATRIURETIC PEPTIDE - Abnormal; Notable for the following components:   B Natriuretic Peptide 884.9 (*)    All other components within normal limits  URINALYSIS, COMPLETE (UACMP) WITH MICROSCOPIC - Abnormal; Notable for the following components:   Color, Urine YELLOW (*)    APPearance TURBID (*)    Bacteria, UA FEW (*)    All other components within normal limits  COMPREHENSIVE METABOLIC PANEL  TROPONIN I (HIGH SENSITIVITY)  TROPONIN I (HIGH SENSITIVITY)   ____________________________________________  EKG  ED ECG REPORT I, Nita Sickle, the attending physician, personally viewed and interpreted this ECG.  Atrial flutter, rate of 89, prolonged QTC at 544, no ST elevations.  Unchanged from prior. ____________________________________________  RADIOLOGY  I have personally reviewed the images performed during this visit and I agree with the Radiologist's read.   Interpretation by Radiologist:  DG Chest Portable 1 View  Result Date: 04/18/2020 CLINICAL DATA:  Chest pain and shortness of breath EXAM: PORTABLE CHEST 1 VIEW COMPARISON:  Seven days ago FINDINGS: Small to moderate bilateral pleural effusion. The lower lobes are partially obscured. Possible cephalized blood flow. No Kerley lines. Cardiomegaly. IMPRESSION:  Cardiomegaly with small to moderate pleural effusions and mild vascular congestion. Electronically Signed   By: Marnee Spring M.D.   On: 04/18/2020 06:16     ____________________________________________   PROCEDURES  Procedure(s) performed:yes .1-3 Lead EKG Interpretation Performed by: Nita Sickle, MD Authorized by: Nita Sickle, MD     Interpretation: abnormal     ECG rate  assessment: normal     Rhythm: atrial flutter     Critical Care performed:  None ____________________________________________   INITIAL IMPRESSION / ASSESSMENT AND PLAN / ED COURSE  74 y.o. female the history of diastolic CHF, A. Fib/ flutter, anemia, hypertension, CVA, seizures who presents for evaluation of shortness of breath and chest tightness when she woke this morning and her oxygen tubing had dislodged from her face. After being placed on O2, patient reports resolution of her symptoms. No CP or SOB at this time.  She is in no obvious distress, with no hypoxia, no tachycardia, normal work of breathing, lungs are clear.  She is volume overloaded with 2-3+ pitting edema.  Recent admission with discharge 5 days ago for CHF exacerbation where patient was diuresed (10L).  Records from recent admission were reviewed.  She arrives in A. fib with well-controlled rate which is her baseline.  Symptoms most likely from possible hypoxia in the setting of dislodged oxygen tubing but will check for any signs of a CHF exacerbation or pneumonia.  Patient placed on telemetry for close monitoring.  _________________________ 7:14 AM on 04/18/2020 -----------------------------------------  Chest x-ray visualized by me showing signs of CHF.  Clinically patient also looks volume overloaded.  She only uses oxygen at nighttime.  Oxygen was discontinued when her sats have remained stable.  Will give a dose of IV Lasix.  BNP significantly improved from recent admission however still elevated.  Chemistry panel and  troponin is pending.  Plan to monitor and get 2 high-sensitivity troponins and if blood work is stable, she remains with no need for oxygen and asymptomatic patient can be discharged back to her facility.  Care and plan discussed with Dr. Marisa Severin who accepted patient at 7AM.    _____________________________________________ Please note:  Patient was evaluated in Emergency Department today for the symptoms described in the history of present illness. Patient was evaluated in the context of the global COVID-19 pandemic, which necessitated consideration that the patient might be at risk for infection with the SARS-CoV-2 virus that causes COVID-19. Institutional protocols and algorithms that pertain to the evaluation of patients at risk for COVID-19 are in a state of rapid change based on information released by regulatory bodies including the CDC and federal and state organizations. These policies and algorithms were followed during the patient's care in the ED.  Some ED evaluations and interventions may be delayed as a result of limited staffing during the pandemic.   [place] Controlled Substance Database was reviewed by me. ____________________________________________   FINAL CLINICAL IMPRESSION(S) / ED DIAGNOSES  Shortness of Breath  NEW MEDICATIONS STARTED DURING THIS VISIT:  ED Discharge Orders    None       Note:  This document was prepared using Dragon voice recognition software and may include unintentional dictation errors.    Nita Sickle, MD 04/19/20 [phone]

## 2020-04-18 NOTE — Progress Notes (Signed)
Initial Nutrition Assessment  DOCUMENTATION CODES:   Not applicable  INTERVENTION:   ProSource Plus po BID, provides 100kcal and 15g of protein per serving   Magic cup TID with meals, each supplement provides 290 kcal and 9 grams of protein  MVI daily   Liberalize diet   NUTRITION DIAGNOSIS:   Unintentional weight loss related to acute illness as evidenced by 6 percent weight loss in 1 month.  GOAL:   Patient will meet greater than or equal to 90% of their needs  MONITOR:   PO intake, Supplement acceptance, Labs, Weight trends, Skin, I & O's  REASON FOR ASSESSMENT:   Malnutrition Screening Tool    ASSESSMENT:   74 y.o. 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  Spoke with pt via phone. Pt is well known to this RD from a recent admit ~ 1 month ago. Pt reports good appetite and oral intake pta. Pt reports that she has been eating better at home; mainly small meals. Pt reports eating 100% of her breakfast this morning which included pancakes. Pt reports that she has her dentures so she is able to eat solid foods. Per chart, pt is down 9lbs(6%) over the past month; this is significant. Pt does not like any supplements as she reports they make her vomit. RD tried numerous supplements during pt's last admit. RD will add Prosource Plus and Magic Cups to help pt meet her estimated needs. RD will also liberalize the heart healthy portion of pt's diet as this is restrictive of protein. Pt previously met criteria for moderate malnutrition; RD suspects pt is still malnourished but unable to diagnose at this time as NFPE cannot be performed. RD will obtain exam at follow-up.    Medications reviewed and include: NaCl tablet, folic acid, synthroid, MVI, protonix  Labs reviewed: Na 116(L) BNP-  884.9(H)- 11/17 wbc- 16.0(H)  NUTRITION - FOCUSED PHYSICAL EXAM: Unable to perform at this time   Diet Order:   Diet Order            Diet Heart Room service appropriate? Yes; Fluid consistency: Thin; Fluid restriction: 1200 mL Fluid  Diet effective now                EDUCATION NEEDS:   Education needs have been addressed  Skin:  Skin Assessment: Reviewed RN Assessment (non healing surgical abdominal wound 1 cm x 1 cmx 1 cm)  Last BM:  pt with ostomy  Height:   Ht Readings from Last 1 Encounters:  04/18/20 '5\' 5"'  (1.651 m)    Weight:   Wt Readings from Last 1 Encounters:  04/18/20 66.4 kg    Ideal Body Weight:  56.8 kg  BMI:  Body mass index is 24.35 kg/m.  Estimated Nutritional Needs:   Kcal:  1400-1600kcal/day  Protein:  70-80g/day  Fluid:  1.4L/day  Koleen Distance MS, RD, LDN Please refer to [facility] for RD and/or RD on-call/weekend/after hours pager

## 2020-04-18 NOTE — ED Triage Notes (Signed)
Pt presents today with SHOB. Pt woke this morning with o2 off, and endorses SHOB with non radiating chest pain. Pain and SHB resolved with o2 therapy. 2L at baseline. Pt arrived with no complaints. Pt presents with LLQ Colostomy

## 2020-04-18 NOTE — Consult Note (Signed)
WOC Nurse Consult Note: Admitted for SOB, ongoing abdominal wound from surgery 03/29/20.  Reason for Consult:Nonhealig surgical wound to abdomen.  Present on admission.  Wound type:surgical Pressure Injury POA: NA Measurement: 1 cm x 1 cmx 1 cm  Wound bed:red and moist Drainage (amount, consistency, odor) minimal serosanguinous  No odor.  Periwound:intact Dressing procedure/placement/frequency: Cleanse abdominal wound with NS and pat dry. Apply alginate to wound bed. Cover with dry dressing and change daily.  WOC Nurse ostomy consult note Stoma type/location: LLQ colostomy  Patient does not perform this care independently.  Stomal assessment/size: 1 1/4" pink and moist well budded Peristomal assessment: intact Treatment options for stomal/peristomal skin: barrier ring and 2 piece 2 3/4" pouch Output soft brown Ostomy pouching: 2pc. 2 3/4" pouch with barrier ring.   Education provided: staff to perform care as needed.  Enrolled patient in [employer] DC program: Yes previously Will not follow at this time.  Please re-consult if needed.  Maple Hudson MSN, RN, FNP-BC CWON Wound, Ostomy, Continence Nurse Pager [phone]

## 2020-04-18 NOTE — ED Provider Notes (Signed)
-----------------------------------------   8:17 AM on 04/18/2020 -----------------------------------------  I took over care of this patient from Dr. Don Perking.  Although she continues to not require oxygen, her sodium is 117.  Therefore, she will require admission.  I discussed her case with Dr. Clyde Lundborg from the hospitalist service.   Dionne Bucy, MD 04/18/20 [phone]

## 2020-04-18 NOTE — Progress Notes (Signed)
Mobility Specialist - Progress Note   04/18/20 1100  Mobility  Range of Motion/Exercises Right leg;Left leg (ankle pumps, heel slides, straight leg raises, hip iso)  Level of Assistance Minimal assist, patient does 75% or more  Assistive Device None  Distance Ambulated (ft) 0 ft  Mobility Response Tolerated well  Mobility performed by Mobility specialist  $Mobility charge 1 Mobility    Pre-mobility: 76 HR, 98% SpO2 During mobility: 83 HR, 98% SpO2 Post-mobility: 87 HR, 98% SpO2   Pt was long-sitting in bed upon arrival. Pt agreed to session. Pt denied any nausea or fatigue, but did c/o lower back pain which she rated a 3/10. Pt was encouraged to ambulate in room, however pt declined stating, "I don't feel like moving right now." Pt agreed to perform exercises: ankle pumps, hip iso, heel slides, and straight leg raises. Pt shows distress in her face while performing straight leg raises and voiced her fatigue. At the end of session, pt requested assistance repositioning to manage pain. Mobility assisted. Overall, pt tolerated session well. Pt was left in bed with all needs in reach. Nurse notified.

## 2020-04-19 LAB — CBC WITH DIFFERENTIAL/PLATELET
Abs Immature Granulocytes: 0.41 10*3/uL — ABNORMAL HIGH (ref 0.00–0.07)
Basophils Absolute: 0.1 10*3/uL (ref 0.0–0.1)
Basophils Relative: 0 %
Eosinophils Absolute: 0.1 10*3/uL (ref 0.0–0.5)
Eosinophils Relative: 1 %
HCT: 36 % (ref 36.0–46.0)
Hemoglobin: 11.4 g/dL — ABNORMAL LOW (ref 12.0–15.0)
Immature Granulocytes: 3 %
Lymphocytes Relative: 10 %
Lymphs Abs: 1.5 10*3/uL (ref 0.7–4.0)
MCH: 25.3 pg — ABNORMAL LOW (ref 26.0–34.0)
MCHC: 31.7 g/dL (ref 30.0–36.0)
MCV: 80 fL (ref 80.0–100.0)
Monocytes Absolute: 0.9 10*3/uL (ref 0.1–1.0)
Monocytes Relative: 6 %
Neutro Abs: 11.5 10*3/uL — ABNORMAL HIGH (ref 1.7–7.7)
Neutrophils Relative %: 80 %
Platelets: 371 10*3/uL (ref 150–400)
RBC: 4.5 MIL/uL (ref 3.87–5.11)
RDW: 16.2 % — ABNORMAL HIGH (ref 11.5–15.5)
WBC: 14.4 10*3/uL — ABNORMAL HIGH (ref 4.0–10.5)
nRBC: 0 % (ref 0.0–0.2)

## 2020-04-19 LAB — CBC
HCT: 31.6 % — ABNORMAL LOW (ref 36.0–46.0)
Hemoglobin: 10.4 g/dL — ABNORMAL LOW (ref 12.0–15.0)
MCH: 25.3 pg — ABNORMAL LOW (ref 26.0–34.0)
MCHC: 32.9 g/dL (ref 30.0–36.0)
MCV: 76.9 fL — ABNORMAL LOW (ref 80.0–100.0)
Platelets: 357 10*3/uL (ref 150–400)
RBC: 4.11 MIL/uL (ref 3.87–5.11)
RDW: 15.9 % — ABNORMAL HIGH (ref 11.5–15.5)
WBC: 14.1 10*3/uL — ABNORMAL HIGH (ref 4.0–10.5)
nRBC: 0.1 % (ref 0.0–0.2)

## 2020-04-19 LAB — RENAL FUNCTION PANEL
Albumin: 3.1 g/dL — ABNORMAL LOW (ref 3.5–5.0)
Anion gap: 10 (ref 5–15)
BUN: 12 mg/dL (ref 8–23)
CO2: 24 mmol/L (ref 22–32)
Calcium: 8.4 mg/dL — ABNORMAL LOW (ref 8.9–10.3)
Chloride: 89 mmol/L — ABNORMAL LOW (ref 98–111)
Creatinine, Ser: 0.73 mg/dL (ref 0.44–1.00)
GFR, Estimated: >60 mL/min (ref >60)
Glucose, Bld: 110 mg/dL — ABNORMAL HIGH (ref 70–99)
Phosphorus: 2.8 mg/dL (ref 2.5–4.6)
Potassium: 3.6 mmol/L (ref 3.5–5.1)
Sodium: 123 mmol/L — ABNORMAL LOW (ref 135–145)

## 2020-04-19 LAB — BASIC METABOLIC PANEL
Anion gap: 10 (ref 5–15)
BUN: 13 mg/dL (ref 8–23)
CO2: 21 mmol/L — ABNORMAL LOW (ref 22–32)
Calcium: 7.8 mg/dL — ABNORMAL LOW (ref 8.9–10.3)
Chloride: 86 mmol/L — ABNORMAL LOW (ref 98–111)
Creatinine, Ser: 0.72 mg/dL (ref 0.44–1.00)
GFR, Estimated: >60 mL/min (ref >60)
Glucose, Bld: 116 mg/dL — ABNORMAL HIGH (ref 70–99)
Potassium: 3.5 mmol/L (ref 3.5–5.1)
Sodium: 117 mmol/L — CL (ref 135–145)

## 2020-04-19 LAB — MAGNESIUM: Magnesium: 1.9 mg/dL (ref 1.7–2.4)

## 2020-04-19 MED ORDER — SODIUM CHLORIDE 1 G PO TABS
1.0000 g | ORAL_TABLET | Freq: Two times a day (BID) | ORAL | Status: DC
  Administered 2020-04-19 – 2020-04-24 (×11): 1 g via ORAL
  Filled 2020-04-19 (×12): qty 1

## 2020-04-19 MED ORDER — SODIUM CHLORIDE 1 G PO TABS
1.0000 g | ORAL_TABLET | Freq: Once | ORAL | Status: AC
  Administered 2020-04-19: 1 g via ORAL
  Filled 2020-04-19: qty 1

## 2020-04-19 NOTE — Progress Notes (Addendum)
PROGRESS NOTE   [name]  ZDG:[id number] DOB: 1945-07-01 DOA: 04/18/2020 PCP: Mickel Fuchs, MD  Brief Narrative:  33 white female ?  Sigmoid obstruction 01/2020 resulting in eventual SBO subtotal colectomy + end ileostomy10/04/2020 -readmit 1027-11/2 severe sepsis 2/2 E. coli, Enterococcus from seroma-nonoperative management-discharged on Augmentin Readmitted again 11/7-11/12 hypoxic respiratory failure 2/2 AECHF/large bilateral pleural effusions P A. 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 at that time Also chronic respiratory failure on 2 L oxygen at night Admitted 11/7 through 04/13/2020 with acute exacerbation HFpEF-discharge weight at that time 64 kg baseline seems to be around 67 Presented from skilled nursing facility 11/17 SOB-usually uses 2 L at night Emergency room work-up BNP 884 troponin VII Sodium 117 Blood pressure 102/71 O2 sat 96 on 2 L CXR = cardiomegaly vascular congestion small pleural effusion    Assessment & Plan:   Principal Problem:   Acute on chronic diastolic CHF (congestive heart failure) (HCC) Active Problems:   Chronic respiratory failure with hypoxia (HCC)   Hypertension   Stroke (HCC)   Hypothyroidism   Hyponatremia   Atrial fibrillation, chronic (HCC)   Leukocytosis   GERD (gastroesophageal reflux disease)   1. Acute respiratory failure superimposed on chronic respiratory failure using 2 L a. Apparently not consistently using oxygen at home and was reordered the same by home health b. Will probably need 2 L oxygen going forward at all times 2. Chronic HFpEF a. Review of weights from home health visit Dr. Dareen Piano 11/3 revealed probably 15 pound weight gain from that home visit compared to prior 10/26 when she was discharged b. I  am not sure how accurate the weights are but she does seem euvolemic therefore would hold Lasix 40 mg which is her home medication 3. Hypervolemic hyponatremia a. Not overloaded  Currently weights are inaccurate although she is -1.7 L from admission b. Continue saline 35 cc/H, continue 1 g salt tabs twice daily c. Diuresis on hold at this time 4. Atrial fibrillation CHADS2 score >7 on Eliquis a. No reports of bleeding stool seems regular brown continue Eliquis 5 twice daily b. Continue Toprol XL 50, continue amiodarone 200 daily c. On telemetry overnight 5. Hypotension in the setting of hypertension a. Blood pressure is low normal may need to address in the next day or so with Toprol-XL b. Is also on midodrine to help support her blood pressure and have to consider benefits of rate control which are better than the risk of hypotension 6. Recent subtotal colectomy, end ileostomy and E. coli enterococcal infection subsequently a. Will need extensive rehab and follow-up in the outpatient setting and return facility b. Continue ostomy care and also 7. Hypothyroidism a. TSH this admission 7.1 b. Continue levothyroxine 75 daily and needs recheck in 3 weeks as an outpatient 8. CVA 2013 a. On Eliquis 9. Prior herpes simplex 2012 10. Seizure disorder-not currently on meds 11. Iron deficiency anemia  DVT prophylaxis: Eliquis Code Status: Full Family Communication: None present at bedside Disposition:  Status is: Inpatient  Remains inpatient appropriate because:Hemodynamically unstable and Persistent severe electrolyte disturbances   Dispo: The patient is from: SNF              Anticipated d/c is to: SNF  Anticipated d/c date is: 2 days              Patient currently is not medically stable to d/c.    Consultants:   Renal  Procedures: None  Antimicrobials: None   Subjective: Awake alert coherent slightly slow speech but pleasant otherwise no distress eating and  drinking no nausea no vomiting no About to work with therapy get up and out of bed  Objective: Vitals:   04/18/20 1213 04/18/20 1703 04/18/20 2106 04/19/20 0601  BP: 102/70 105/83 118/73 106/81  Pulse: 73 99 77 77  Resp: 19 18 18 18   Temp: 97.6 F (36.4 C) 98 F (36.7 C) (!) 97.4 F (36.3 C) (!) 97.4 F (36.3 C)  TempSrc: Oral Oral Oral Oral  SpO2: 100% 98% 100% 99%  Weight:    68.9 kg  Height:        Intake/Output Summary (Last 24 hours) at 04/19/2020 0724 Last data filed at 04/19/2020 0537 Gross per 24 hour  Intake 249.62 ml  Output 2000 ml  Net -1750.38 ml   Filed Weights   04/18/20 0933 04/19/20 0601  Weight: 66.4 kg 68.9 kg    Examination: EOMI NCAT no focal deficit CTA B no added sound no rales no rhonchi S1-S2 no murmur no rub no gallop seems to be in A. fib rate controlled Abdomen soft no rebound does have colostomy bag right side No lower extremity edema no rales no rhonchi   Data Reviewed: I have personally reviewed following labs and imaging studies Sodium 117-at >123 chloride 86 BUNs/creatinine 12/0.7 WBC down from 16-->14 platelet 371  Radiology Studies: DG Chest Portable 1 View  Result Date: 04/18/2020 CLINICAL DATA:  Chest pain and shortness of breath EXAM: PORTABLE CHEST 1 VIEW COMPARISON:  Seven days ago FINDINGS: Small to moderate bilateral pleural effusion. The lower lobes are partially obscured. Possible cephalized blood flow. No Kerley lines. Cardiomegaly. IMPRESSION: Cardiomegaly with small to moderate pleural effusions and mild vascular congestion. Electronically Signed   By: 04/20/2020 M.D.   On: 04/18/2020 06:16     Scheduled Meds: . (feeding supplement) PROSource Plus  30 mL Oral BID BM  . amiodarone  200 mg Oral Daily  . apixaban  5 mg Oral BID  . folic acid  1 mg Oral Daily  . hydrocerin  1 application Topical BID  . levothyroxine  75 mcg Oral Q0600  . metoprolol succinate  50 mg Oral BID AC  . midodrine  2.5 mg Oral TID WC   . multivitamin with minerals  1 tablet Oral Daily  . pantoprazole  40 mg Oral Daily  . sodium chloride flush  3 mL Intravenous Q12H  . sodium chloride  1 g Oral BID WC   Continuous Infusions: . sodium chloride    . sodium chloride 35 mL/hr at 04/18/20 2049     LOS: 1 day    Time spent: 41  34, MD [facility] To contact the attending provider between 7A-7P or the covering provider during after hours 7P-7A, please log into the web site [email or web address] and access using universal [place] password for that web site. If you do not have the password, please call the hospital operator.  04/19/2020, 7:24 AM

## 2020-04-19 NOTE — Progress Notes (Signed)
[facility]  ROUNDING NOTE   Subjective:   Patient sitting up in bed, consuming breakfast.She reports fair appetite, no nausea or vomiting.Her Sodium level improved to 123 this morning.  Objective:  Vital signs in last 24 hours:  Temp:  [97.4 F (36.3 C)-98.3 F (36.8 C)] 97.6 F (36.4 C) (11/18 1141) Pulse Rate:  [73-110] 100 (11/18 1141) Resp:  [18-19] 18 (11/18 1141) BP: (102-118)/(70-83) 102/77 (11/18 1141) SpO2:  [98 %-100 %] 100 % (11/18 1141) Weight:  [68.9 kg] 68.9 kg (11/18 0601)  Weight change:  Filed Weights   04/18/20 0933 04/19/20 0601  Weight: 66.4 kg 68.9 kg    Intake/Output: I/O last 3 completed shifts: In: 249.6 [I.V.:249.6] Out: 2000 [Urine:900; Stool:1100]   Intake/Output this shift:  Total I/O In: 480 [P.O.:480] Out: 500 [Stool:500]  Physical Exam: General: No acute distress  Head: Moist oral mucosal membranes  Eyes: Sclerae and conjunctivae clear  Lungs:  Clear to auscultation bilaterally  Heart: Irregular  Abdomen:  Soft, nontender, non distended  Extremities:  No  peripheral edema.  Neurologic: Awake, alert,oriented  Skin: No acute lesions or rashes    Basic Metabolic Panel: Recent Labs  Lab 04/18/20 0844 04/18/20 0844 04/18/20 1258 04/18/20 1258 04/18/20 1758 04/18/20 2351 04/19/20 0909  NA 116*  --  120*  --  119* 117* 123*  K 4.3  --  3.6  --  3.4* 3.5 3.6  CL 84*  --  88*  --  86* 86* 89*  CO2 20*  --  19*  --  21* 21* 24  GLUCOSE 83  --  139*  --  101* 116* 110*  BUN 12  --  13  --  13 13 12   CREATININE 0.75  --  0.75  --  0.72 0.72 0.73  CALCIUM 8.5*   < > 7.9*   < > 8.0* 7.8* 8.4*  MG  --   --   --   --   --  1.9  --   PHOS  --   --   --   --   --   --  2.8   < > = values in this interval not displayed.    Liver Function Tests: Recent Labs  Lab 04/18/20 0540 04/19/20 0909  AST 67*  --   ALT 65*  --   ALKPHOS 104  --   BILITOT 1.1  --   PROT 6.8  --   ALBUMIN 3.2* 3.1*   No results for  input(s): LIPASE, AMYLASE in the last 168 hours. No results for input(s): AMMONIA in the last 168 hours.  CBC: Recent Labs  Lab 04/18/20 0540 04/18/20 2351 04/19/20 0918  WBC 16.0* 14.1* 14.4*  NEUTROABS 11.8*  --  11.5*  HGB 11.5* 10.4* 11.4*  HCT 35.6* 31.6* 36.0  MCV 78.9* 76.9* 80.0  PLT 406* 357 371    Cardiac Enzymes: No results for input(s): CKTOTAL, CKMB, CKMBINDEX, TROPONINI in the last 168 hours.  BNP: Invalid input(s): POCBNP  CBG: No results for input(s): GLUCAP in the last 168 hours.  Microbiology: Results for orders placed or performed during the hospital encounter of 04/18/20  Resp Panel by RT PCR (RSV, Flu A&B, Covid) - Nasopharyngeal Swab     Status: None   Collection Time: 04/18/20  8:44 AM   Specimen: Nasopharyngeal Swab  Result Value Ref Range Status   SARS Coronavirus 2 by RT PCR NEGATIVE NEGATIVE Final    Comment: (NOTE) SARS-CoV-2 target nucleic acids  are NOT DETECTED.  The SARS-CoV-2 RNA is generally detectable in upper respiratoy specimens during the acute phase of infection. The lowest concentration of SARS-CoV-2 viral copies this assay can detect is 131 copies/mL. A negative result does not preclude SARS-Cov-2 infection and should not be used as the sole basis for treatment or other patient management decisions. A negative result may occur with  improper specimen collection/handling, submission of specimen other than nasopharyngeal swab, presence of viral mutation(s) within the areas targeted by this assay, and inadequate number of viral copies (<131 copies/mL). A negative result must be combined with clinical observations, patient history, and epidemiological information. The expected result is Negative.  Fact Sheet for Patients:  https://www.moore.com/  Fact Sheet for Healthcare Providers:  https://www.young.biz/  This test is no t yet approved or cleared by the [place] FDA and  has been  authorized for detection and/or diagnosis of SARS-CoV-2 by FDA under an Emergency Use Authorization (EUA). This EUA will remain  in effect (meaning this test can be used) for the duration of the COVID-19 declaration under Section 564(b)(1) of the Act, 21 U.S.C. section 360bbb-3(b)(1), unless the authorization is terminated or revoked sooner.     Influenza A by PCR NEGATIVE NEGATIVE Final   Influenza B by PCR NEGATIVE NEGATIVE Final    Comment: (NOTE) The Xpert Xpress SARS-CoV-2/FLU/RSV assay is intended as an aid in  the diagnosis of influenza from Nasopharyngeal swab specimens and  should not be used as a sole basis for treatment. Nasal washings and  aspirates are unacceptable for Xpert Xpress SARS-CoV-2/FLU/RSV  testing.  Fact Sheet for Patients: https://www.moore.com/  Fact Sheet for Healthcare Providers: https://www.young.biz/  This test is not yet approved or cleared by the [place] FDA and  has been authorized for detection and/or diagnosis of SARS-CoV-2 by  FDA under an Emergency Use Authorization (EUA). This EUA will remain  in effect (meaning this test can be used) for the duration of the  Covid-19 declaration under Section 564(b)(1) of the Act, 21  U.S.C. section 360bbb-3(b)(1), unless the authorization is  terminated or revoked.    Respiratory Syncytial Virus by PCR NEGATIVE NEGATIVE Final    Comment: (NOTE) Fact Sheet for Patients: https://www.moore.com/  Fact Sheet for Healthcare Providers: https://www.young.biz/  This test is not yet approved or cleared by the [place] FDA and  has been authorized for detection and/or diagnosis of SARS-CoV-2 by  FDA under an Emergency Use Authorization (EUA). This EUA will remain  in effect (meaning this test can be used) for the duration of the  COVID-19 declaration under Section 564(b)(1) of the Act, 21 U.S.C.  section 360bbb-3(b)(1),  unless the authorization is terminated or  revoked. Performed at [facility], [address] [place], [place] [address]   Culture, blood (Routine X 2) w Reflex to ID Panel     Status: Abnormal (Preliminary result)   Collection Time: 04/18/20 10:26 AM   Specimen: BLOOD LEFT HAND  Result Value Ref Range Status   Specimen Description BLOOD LEFT HAND  Final   Special Requests (A)  Final    AEROCOCCUS SPECIES Blood Culture results may not be optimal due to an inadequate volume of blood received in culture bottles   Culture   Final    NO GROWTH < 24 HOURS Performed at [facility], [address] [place], [place] [address]    Report Status PENDING  Incomplete  Culture, blood (Routine X 2) w Reflex to ID Panel  Status: None (Preliminary result)   Collection Time: 04/18/20 10:34 AM   Specimen: BLOOD  Result Value Ref Range Status   Specimen Description BLOOD LEFT ANTECUBITAL  Final   Special Requests   Final    BOTTLES DRAWN AEROBIC AND ANAEROBIC Blood Culture adequate volume   Culture   Final    NO GROWTH < 24 HOURS Performed at [facility], [address] [place], [place] [address]    Report Status PENDING  Incomplete    Coagulation Studies: No results for input(s): LABPROT, INR in the last 72 hours.  Urinalysis: Recent Labs    04/18/20 0540  COLORURINE YELLOW*  LABSPEC 1.019  PHURINE 5.0  GLUCOSEU NEGATIVE  HGBUR NEGATIVE  BILIRUBINUR NEGATIVE  KETONESUR NEGATIVE  PROTEINUR NEGATIVE  NITRITE NEGATIVE  LEUKOCYTESUR NEGATIVE      Imaging: DG Chest Portable 1 View  Result Date: 04/18/2020 CLINICAL DATA:  Chest pain and shortness of breath EXAM: PORTABLE CHEST 1 VIEW COMPARISON:  Seven days ago FINDINGS: Small to moderate bilateral pleural effusion. The lower lobes are partially obscured. Possible cephalized blood flow. No Kerley lines. Cardiomegaly. IMPRESSION: Cardiomegaly with small to moderate pleural effusions and mild vascular  congestion. Electronically Signed   By: Marnee Spring M.D.   On: 04/18/2020 06:16     Medications:   . sodium chloride    . sodium chloride 35 mL/hr at 04/18/20 2049   . (feeding supplement) PROSource Plus  30 mL Oral BID BM  . amiodarone  200 mg Oral Daily  . apixaban  5 mg Oral BID  . folic acid  1 mg Oral Daily  . hydrocerin  1 application Topical BID  . levothyroxine  75 mcg Oral Q0600  . metoprolol succinate  50 mg Oral BID AC  . midodrine  2.5 mg Oral TID WC  . multivitamin with minerals  1 tablet Oral Daily  . pantoprazole  40 mg Oral Daily  . sodium chloride flush  3 mL Intravenous Q12H  . sodium chloride  1 g Oral BID WC   sodium chloride, albuterol, dextromethorphan-guaiFENesin, hydrALAZINE, LORazepam, sodium chloride flush  Assessment/ Plan:  Ms. MARLETTE CURVIN is a 74 y.o.  female with a PMHX of diverticulitis status post colectomy and ileostomy, CHF, hypertension, stroke, A. fib, GERD, hypothyroidism, seizure, IBS, hyponatremia, anemia, chronic respiratory failure with hypoxia on 2 L of oxygen by nasal cannula at night,  , was admitted to [facility] on 04/18/2020 with c/o shortness of breath and chest tightness, started when her nasal cannula was off of her nose during the night. Nephrology was consulted for the management of hyponatremia  #Hyponatremia Sodium  Level improved to 123 this morning  IV Normal saline at low rate 35 ml/hr started last night, will continue at the same rate  Continue monitoring respiratory status and discontinue IVF if any symptoms of volume overload  #Acute on chronic diastolic congestive heart failure Patient appears in no acute respiratory distress Keeps O2 2L as needed,reports dyspnea with exertion  #Hypertension #Atrial Fibrillation Patient is on Toprol XL BP readings within normal range   LOS: 1 Lucianna Ostlund 11/18/202112:10 PM

## 2020-04-19 NOTE — Progress Notes (Signed)
   04/19/20 1010  Clinical Encounter Type  Visited With Patient  Visit Type Follow-up;Psychological support;Spiritual support  Referral From Chaplain  Consult/Referral To Chaplain  Pt wanted Ch to bring Our Daily Bread. Went to the room wrong one. Ch will be taking back the right one.   ?Ch took Pt a Our Daily [employer] and had prayer with the Pt. Ch will follow-up later.

## 2020-04-19 NOTE — Progress Notes (Signed)
Mobility Specialist - Progress Note   04/19/20 1500  Mobility  Range of Motion/Exercises Right leg;Left leg (ankle pumps, slr, hip add/abd, hip iso)  Level of Assistance Standby assist, set-up cues, supervision of patient - no hands on  Assistive Device None  Distance Ambulated (ft) 0 ft  Mobility Response Tolerated well  Mobility performed by Mobility specialist  $Mobility charge 1 Mobility    Pre-mobility: 95 HR, 99% SpO2 Post-mobility: 92 HR, 99% SpO2   Pt was lying in bed upon arrival utilizing 2L [place] O2. Pt agreed to session. Pt denied pain, nausea, or fatigue. Pt did c/o SOB upon arrival, O2 sat at 99%. Pt declined EOB activity this date, no reason specified. Pt performed supine exercises: ankle pumps, straight leg raises, hip isometrics, hip add/abd, and heel slides SBA. Pt stated, "my R leg is weaker than my L". Overall, pt tolerated session well. Pt was left in bed with all needs in reach and alarm set.    Filiberto Pinks Mobility Specialist 04/19/20, 3:45 PM

## 2020-04-20 LAB — CBC WITH DIFFERENTIAL/PLATELET
Abs Immature Granulocytes: 0.32 10*3/uL — ABNORMAL HIGH (ref 0.00–0.07)
Basophils Absolute: 0.1 10*3/uL (ref 0.0–0.1)
Basophils Relative: 1 %
Eosinophils Absolute: 0.1 10*3/uL (ref 0.0–0.5)
Eosinophils Relative: 1 %
HCT: 35.6 % — ABNORMAL LOW (ref 36.0–46.0)
Hemoglobin: 11.1 g/dL — ABNORMAL LOW (ref 12.0–15.0)
Immature Granulocytes: 3 %
Lymphocytes Relative: 11 %
Lymphs Abs: 1.3 10*3/uL (ref 0.7–4.0)
MCH: 25.2 pg — ABNORMAL LOW (ref 26.0–34.0)
MCHC: 31.2 g/dL (ref 30.0–36.0)
MCV: 80.7 fL (ref 80.0–100.0)
Monocytes Absolute: 0.8 10*3/uL (ref 0.1–1.0)
Monocytes Relative: 7 %
Neutro Abs: 9.4 10*3/uL — ABNORMAL HIGH (ref 1.7–7.7)
Neutrophils Relative %: 77 %
Platelets: 336 10*3/uL (ref 150–400)
RBC: 4.41 MIL/uL (ref 3.87–5.11)
RDW: 16.1 % — ABNORMAL HIGH (ref 11.5–15.5)
WBC: 12 10*3/uL — ABNORMAL HIGH (ref 4.0–10.5)
nRBC: 0.2 % (ref 0.0–0.2)

## 2020-04-20 LAB — COMPREHENSIVE METABOLIC PANEL
ALT: 48 U/L — ABNORMAL HIGH (ref 0–44)
AST: 32 U/L (ref 15–41)
Albumin: 2.8 g/dL — ABNORMAL LOW (ref 3.5–5.0)
Alkaline Phosphatase: 103 U/L (ref 38–126)
Anion gap: 10 (ref 5–15)
BUN: 12 mg/dL (ref 8–23)
CO2: 20 mmol/L — ABNORMAL LOW (ref 22–32)
Calcium: 8.3 mg/dL — ABNORMAL LOW (ref 8.9–10.3)
Chloride: 97 mmol/L — ABNORMAL LOW (ref 98–111)
Creatinine, Ser: 0.67 mg/dL (ref 0.44–1.00)
GFR, Estimated: >60 mL/min (ref >60)
Glucose, Bld: 96 mg/dL (ref 70–99)
Potassium: 4.3 mmol/L (ref 3.5–5.1)
Sodium: 127 mmol/L — ABNORMAL LOW (ref 135–145)
Total Bilirubin: 0.5 mg/dL (ref 0.3–1.2)
Total Protein: 6.1 g/dL — ABNORMAL LOW (ref 6.5–8.1)

## 2020-04-20 MED ORDER — FUROSEMIDE 10 MG/ML IJ SOLN
20.0000 mg | Freq: Once | INTRAMUSCULAR | Status: AC
  Administered 2020-04-20: 20 mg via INTRAVENOUS
  Filled 2020-04-20: qty 2

## 2020-04-20 NOTE — Progress Notes (Signed)
[facility]  ROUNDING NOTE   Subjective:   Patient  appears in no acute distress.She denies worsening SOB, uses nasal cannula with supplemental O2 as needed. She is waiting for her return back to [employer], states she is missing the activities there.  Sodium improved to 127 today  Objective:  Vital signs in last 24 hours:  Temp:  [97.6 F (36.4 C)-98.2 F (36.8 C)] 97.8 F (36.6 C) (11/19 0757) Pulse Rate:  [94-109] 109 (11/19 0757) Resp:  [18] 18 (11/19 0757) BP: (102-120)/(77-92) 113/82 (11/19 0757) SpO2:  [99 %-100 %] 100 % (11/19 0757) Weight:  [66 kg] 66 kg (11/19 0306)  Weight change: -0.363 kg Filed Weights   04/18/20 0933 04/19/20 0601 04/20/20 0306  Weight: 66.4 kg 68.9 kg 66 kg    Intake/Output: I/O last 3 completed shifts: In: 2055.7 [P.O.:960; I.V.:1095.7] Out: 3300 [Urine:2600; Stool:700]   Intake/Output this shift:  Total I/O In: 3 [I.V.:3] Out: -   Physical Exam: General: Sitting up in bed, consuming breakfast  Head: Normocephalic,atraumatic  Eyes: Anicteric  Lungs:  Respirations symmetrical,unlabored, 3L O2 via nasal cannula in place,lungs clear  Heart: S1S2,no rubs or gallops,  Irregular rhythm  Abdomen:  Soft, nontender, slightly distended,colostomy bag in place  Extremities:  No  peripheral edema.  Neurologic: Oriented x 3  Skin: No acute lesions or rashes    Basic Metabolic Panel: Recent Labs  Lab 04/18/20 1258 04/18/20 1258 04/18/20 1758 04/18/20 1758 04/18/20 2351 04/19/20 0909 04/20/20 0405  NA 120*  --  119*  --  117* 123* 127*  K 3.6  --  3.4*  --  3.5 3.6 4.3  CL 88*  --  86*  --  86* 89* 97*  CO2 19*  --  21*  --  21* 24 20*  GLUCOSE 139*  --  101*  --  116* 110* 96  BUN 13  --  13  --  13 12 12   CREATININE 0.75  --  0.72  --  0.72 0.73 0.67  CALCIUM 7.9*   < > 8.0*   < > 7.8* 8.4* 8.3*  MG  --   --   --   --  1.9  --   --   PHOS  --   --   --   --   --  2.8  --    < > = values in this interval not  displayed.    Liver Function Tests: Recent Labs  Lab 04/18/20 0540 04/19/20 0909 04/20/20 0405  AST 67*  --  32  ALT 65*  --  48*  ALKPHOS 104  --  103  BILITOT 1.1  --  0.5  PROT 6.8  --  6.1*  ALBUMIN 3.2* 3.1* 2.8*   No results for input(s): LIPASE, AMYLASE in the last 168 hours. No results for input(s): AMMONIA in the last 168 hours.  CBC: Recent Labs  Lab 04/18/20 0540 04/18/20 2351 04/19/20 0918 04/20/20 0405  WBC 16.0* 14.1* 14.4* 12.0*  NEUTROABS 11.8*  --  11.5* 9.4*  HGB 11.5* 10.4* 11.4* 11.1*  HCT 35.6* 31.6* 36.0 35.6*  MCV 78.9* 76.9* 80.0 80.7  PLT 406* 357 371 336    Cardiac Enzymes: No results for input(s): CKTOTAL, CKMB, CKMBINDEX, TROPONINI in the last 168 hours.  BNP: Invalid input(s): POCBNP  CBG: No results for input(s): GLUCAP in the last 168 hours.  Microbiology: Results for orders placed or performed during the hospital encounter of 04/18/20  Resp Panel  by RT PCR (RSV, Flu A&B, Covid) - Nasopharyngeal Swab     Status: None   Collection Time: 04/18/20  8:44 AM   Specimen: Nasopharyngeal Swab  Result Value Ref Range Status   SARS Coronavirus 2 by RT PCR NEGATIVE NEGATIVE Final    Comment: (NOTE) SARS-CoV-2 target nucleic acids are NOT DETECTED.  The SARS-CoV-2 RNA is generally detectable in upper respiratoy specimens during the acute phase of infection. The lowest concentration of SARS-CoV-2 viral copies this assay can detect is 131 copies/mL. A negative result does not preclude SARS-Cov-2 infection and should not be used as the sole basis for treatment or other patient management decisions. A negative result may occur with  improper specimen collection/handling, submission of specimen other than nasopharyngeal swab, presence of viral mutation(s) within the areas targeted by this assay, and inadequate number of viral copies (<131 copies/mL). A negative result must be combined with clinical observations, patient history, and  epidemiological information. The expected result is Negative.  Fact Sheet for Patients:  https://www.moore.com/  Fact Sheet for Healthcare Providers:  https://www.young.biz/  This test is no t yet approved or cleared by the [place] FDA and  has been authorized for detection and/or diagnosis of SARS-CoV-2 by FDA under an Emergency Use Authorization (EUA). This EUA will remain  in effect (meaning this test can be used) for the duration of the COVID-19 declaration under Section 564(b)(1) of the Act, 21 U.S.C. section 360bbb-3(b)(1), unless the authorization is terminated or revoked sooner.     Influenza A by PCR NEGATIVE NEGATIVE Final   Influenza B by PCR NEGATIVE NEGATIVE Final    Comment: (NOTE) The Xpert Xpress SARS-CoV-2/FLU/RSV assay is intended as an aid in  the diagnosis of influenza from Nasopharyngeal swab specimens and  should not be used as a sole basis for treatment. Nasal washings and  aspirates are unacceptable for Xpert Xpress SARS-CoV-2/FLU/RSV  testing.  Fact Sheet for Patients: https://www.moore.com/  Fact Sheet for Healthcare Providers: https://www.young.biz/  This test is not yet approved or cleared by the [place] FDA and  has been authorized for detection and/or diagnosis of SARS-CoV-2 by  FDA under an Emergency Use Authorization (EUA). This EUA will remain  in effect (meaning this test can be used) for the duration of the  Covid-19 declaration under Section 564(b)(1) of the Act, 21  U.S.C. section 360bbb-3(b)(1), unless the authorization is  terminated or revoked.    Respiratory Syncytial Virus by PCR NEGATIVE NEGATIVE Final    Comment: (NOTE) Fact Sheet for Patients: https://www.moore.com/  Fact Sheet for Healthcare Providers: https://www.young.biz/  This test is not yet approved or cleared by the [place] FDA and   has been authorized for detection and/or diagnosis of SARS-CoV-2 by  FDA under an Emergency Use Authorization (EUA). This EUA will remain  in effect (meaning this test can be used) for the duration of the  COVID-19 declaration under Section 564(b)(1) of the Act, 21 U.S.C.  section 360bbb-3(b)(1), unless the authorization is terminated or  revoked. Performed at [facility], [address] [place], [place] [address]   Culture, blood (Routine X 2) w Reflex to ID Panel     Status: Abnormal (Preliminary result)   Collection Time: 04/18/20 10:26 AM   Specimen: BLOOD LEFT HAND  Result Value Ref Range Status   Specimen Description BLOOD LEFT HAND  Final   Special Requests (A)  Final    AEROCOCCUS SPECIES Blood Culture results may not be optimal due to an inadequate volume  of blood received in culture bottles   Culture   Final    NO GROWTH 2 DAYS Performed at [facility], [address] [place], [place] [address]    Report Status PENDING  Incomplete  Culture, blood (Routine X 2) w Reflex to ID Panel     Status: None (Preliminary result)   Collection Time: 04/18/20 10:34 AM   Specimen: BLOOD  Result Value Ref Range Status   Specimen Description BLOOD LEFT ANTECUBITAL  Final   Special Requests   Final    BOTTLES DRAWN AEROBIC AND ANAEROBIC Blood Culture adequate volume   Culture   Final    NO GROWTH 2 DAYS Performed at [facility], [address], [place], [place] [address]    Report Status PENDING  Incomplete    Coagulation Studies: No results for input(s): LABPROT, INR in the last 72 hours.  Urinalysis: Recent Labs    04/18/20 0540  COLORURINE YELLOW*  LABSPEC 1.019  PHURINE 5.0  GLUCOSEU NEGATIVE  HGBUR NEGATIVE  BILIRUBINUR NEGATIVE  KETONESUR NEGATIVE  PROTEINUR NEGATIVE  NITRITE NEGATIVE  LEUKOCYTESUR NEGATIVE      Imaging: No results found.   Medications:   . sodium chloride    . sodium chloride 35 mL/hr at 04/20/20 0005    . (feeding supplement) PROSource Plus  30 mL Oral BID BM  . amiodarone  200 mg Oral Daily  . apixaban  5 mg Oral BID  . folic acid  1 mg Oral Daily  . hydrocerin  1 application Topical BID  . levothyroxine  75 mcg Oral Q0600  . metoprolol succinate  50 mg Oral BID AC  . midodrine  2.5 mg Oral TID WC  . multivitamin with minerals  1 tablet Oral Daily  . pantoprazole  40 mg Oral Daily  . sodium chloride flush  3 mL Intravenous Q12H  . sodium chloride  1 g Oral BID WC   sodium chloride, albuterol, dextromethorphan-guaiFENesin, hydrALAZINE, LORazepam, sodium chloride flush  Assessment/ Plan:  [name] is a 74 y.o.  female with a PMHX of diverticulitis status post colectomy and ileostomy, CHF, hypertension, stroke, A. fib, GERD, hypothyroidism, seizure, IBS, hyponatremia, anemia, chronic respiratory failure with hypoxia on 2 L of oxygen by nasal cannula at night,  , was admitted to [facility] on 04/18/2020 with c/o shortness of breath and chest tightness, started when her nasal cannula was off of her nose during the night. Nephrology was consulted for the management of hyponatremia  #Hyponatremia S.Sodium levels gradually improving, 127 today Patient in on Normal Saline 35 ml/hr,increasimg to 40 ml/hr after discussing with Dr.Lateef,attending nephrologist Continue Sodium Chloride tablets 1gm BID   #Acute on chronic diastolic congestive heart failure Echo on 12/06/2019 with EF 55 to 60% Respiratory status stable,continues to use supplemental O2 2-3 L as needed Continue monitoring for s/s of fluid overload  #Hypertension #Atrial Fibrillation Rate controlled with Toprol XL Normotensive today  LOS: 2 Zarian Colpitts 11/19/202110:13 AM

## 2020-04-20 NOTE — TOC Initial Note (Addendum)
Transition of Care [facility]) - Initial/Assessment Note    Patient Details  Name: [name] MRN: [id number] Date of Birth: 1946/02/26  [facility]) CM/SW Contact:    Maree Krabbe, LCSW Phone Number: 04/20/2020, 1:38 PM  Clinical Narrative:   Pt was admitted from [facility]. CSW spoke with pt and the plan is to return to [facility] at d/c. However, pt did not do bed hold in the hopes that bed will be available when pt is medically stable for d/c. Pt states she really likes the activities at [facility].   Pt states her Son or [name] takes her to her medical appointments. Pt uses [facility] for pharmacy. Pt still sees Dr. Butler Hood at [facility]. Pt states she is agreeable to return to [facility] at d/c.      Heart failure screening completed on 11/09,. Pt has a scale at home.           Expected Discharge Plan: Skilled Nursing Facility Barriers to Discharge: Continued Medical Work up   Patient Goals and CMS Choice Patient states their goals for this hospitalization and ongoing recovery are:: to return to [facility] offered to / list presented to : Patient  Expected Discharge Plan and Services Expected Discharge Plan: Skilled Nursing Facility In-house Referral: Clinical Social Work   Post Acute Care Choice: Skilled Nursing Facility Living arrangements for the past 2 months: Skilled Nursing Facility                                      Prior Living Arrangements/Services Living arrangements for the past 2 months: Skilled Nursing Facility Lives with:: Facility Resident Patient language and need for interpreter reviewed:: Yes Do you feel safe going back to the place where you live?: Yes      Need for Family Participation in Patient Care: Yes (Comment) Care giver support system in place?: Yes (comment)   Criminal Activity/Legal Involvement Pertinent to Current Situation/Hospitalization: No - Comment as needed  Activities of  Daily Living Home Assistive Devices/Equipment: Environmental consultant (specify type) ADL Screening (condition at time of admission) Patient's cognitive ability adequate to safely complete daily activities?: Yes Is the patient deaf or have difficulty hearing?: No Does the patient have difficulty seeing, even when wearing glasses/contacts?: No Does the patient have difficulty concentrating, remembering, or making decisions?: No Patient able to express need for assistance with ADLs?: No Does the patient have difficulty dressing or bathing?: No Independently performs ADLs?: Yes (appropriate for developmental age) Does the patient have difficulty walking or climbing stairs?: No Weakness of Legs: None Weakness of Arms/Hands: None  Permission Sought/Granted Permission sought to share information with : Family Supports    Share Information with NAME: [name]  Permission granted to share info w AGENCY: Licensed conveyancer granted to share info w Relationship: son/POA     Emotional Assessment Appearance:: Appears stated age Attitude/Demeanor/Rapport: Engaged Affect (typically observed): Accepting, Appropriate Orientation: : Oriented to Self, Oriented to Place, Oriented to  Time, Oriented to Situation Alcohol / Substance Use: Not Applicable Psych Involvement: No (comment)  Admission diagnosis:  Hyponatremia [E87.1] Acute on chronic diastolic CHF (congestive heart failure) (HCC) [I50.33] Patient Active Problem List   Diagnosis Date Noted  . Leukocytosis 04/18/2020  . GERD (gastroesophageal reflux disease) 04/18/2020  . Acute CHF (congestive heart failure) (HCC) 04/09/2020  . Nocturnal hypoxia   . Atrial  fibrillation, chronic (HCC)   . Weakness   . Severe sepsis (HCC) 03/29/2020  .  Possible Abscess of postoperative wound of abdominal wall 03/29/2020  . S/P laparotomy 03/12/20  03/29/2020  . History of CVA (cerebrovascular accident) 03/29/2020  . Hypotension   . Malnutrition of moderate degree  03/19/2020  . Hyponatremia 03/12/2020  . Chronic diastolic heart failure (HCC) 03/12/2020  . Large bowel obstruction (HCC) 03/12/2020  . Abdominal distention 03/11/2020  . Iron deficiency anemia 12/20/2019  . Acute on chronic diastolic CHF (congestive heart failure) (HCC) 12/04/2019  . Atrial fibrillation with RVR (HCC)   . Hypertension   . Stroke (HCC)   . Hypothyroidism   . Acute respiratory failure with hypoxia (HCC)   . Acute congestive heart failure (HCC) 03/17/2019  . Acute gastroenteritis 07/05/2017  . Chronic respiratory failure with hypoxia (HCC) 03/27/2011   PCP:  Lauren Fuchs, MD Pharmacy:   [facility] [place], [place] - [address] [place] [address] Phone: [phone] Fax: [phone]     Social Determinants of Health (SDOH) Interventions    Readmission Risk Interventions Readmission Risk Prevention Plan 04/20/2020 04/10/2020 03/30/2020  Transportation Screening Complete Complete Complete  PCP or Specialist Appt within 3-5 Days - - Complete  HRI or Home Care Consult - - Complete  Social Work Consult for Recovery Care Planning/Counseling - - Complete  Palliative Care Screening - - Not Applicable  Medication Review Oceanographer) Complete Complete Complete  PCP or Specialist appointment within 3-5 days of discharge Complete Complete -  HRI or Home Care Consult Complete Complete -  SW Recovery Care/Counseling Consult Complete Complete -  Palliative Care Screening Not Applicable Not Applicable -  Skilled Nursing Facility Complete Complete -  Some recent data might be hidden

## 2020-04-20 NOTE — Progress Notes (Signed)
PROGRESS NOTE   [name]  XLK:[id number] DOB: Sep 15, 1945 DOA: 04/18/2020 PCP: Mickel Fuchs, MD  Brief Narrative:  38 white female ?  Sigmoid obstruction 01/2020 resulting in eventual SBO subtotal colectomy + end ileostomy10/04/2020 -readmit 1027-11/2 severe sepsis 2/2 E. coli, Enterococcus from seroma-nonoperative management-discharged on Augmentin Readmitted again 11/7-11/12 hypoxic respiratory failure 2/2 AECHF/large bilateral pleural effusions P A. 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 at that time Also chronic respiratory failure on 2 L oxygen at night Admitted 11/7 through 04/13/2020 with acute exacerbation HFpEF-discharge weight at that time 64 kg baseline seems to be around 67 Presented from skilled nursing facility 11/17 SOB-usually uses 2 L at night Emergency room work-up BNP 884 troponin VII Sodium 117 Blood pressure 102/71 O2 sat 96 on 2 L CXR = cardiomegaly vascular congestion small pleural effusion  Secondary to low sodium nephrology was consulted  Assessment & Plan:   Principal Problem:   Acute on chronic diastolic CHF (congestive heart failure) (HCC) Active Problems:   Chronic respiratory failure with hypoxia (HCC)   Hypertension   Stroke (HCC)   Hypothyroidism   Hyponatremia   Atrial fibrillation, chronic (HCC)   Leukocytosis   GERD (gastroesophageal reflux disease)   1. Acute respiratory failure superimposed on chronic respiratory failure using 2 L a. Apparently not consistently using oxygen at home (uses only at night) b. Will probably need 2 L oxygen going forward at all times 2. Acute superimposed on chronic HFpEF a. Review of weights from home health visit Dr. Dareen Piano 11/3 revealed probably 15 pound weight gain from that home visit  compared to prior 10/26 when she was discharged b. Lasix initially held as felt patient was euvolemic c. Given IV Lasix 20 mg is feeling somewhat more short of breath with acute heart failure on 11/19 d. Get chest x-ray a.m. 3. Hypervolemic hyponatremia a. Appreciate nephrology input and guidance sodium is improving steadily but unfortunately holding fluids 11/19 b. Discontinued saline 40 cc/h c. Continue 1 g salt tabs twice daily d. Resume low-dose diuretics 11/19 given 4. Atrial fibrillation CHADS2 score >7 on Eliquis a. No reports of bleeding stool seems regular brown continue Eliquis 5 twice daily b. Continue Toprol XL 50, continue amiodarone 200 daily 5. Hypotension in the setting of hypertension a. Blood pressure normalizing b. Is also on midodrine to help support her blood pressure  6. Recent subtotal colectomy, end ileostomy and E. coli enterococcal infection subsequently a. Will need extensive rehab and follow-up in the outpatient setting and return facility b. Continue ostomy care and also 7. Hypothyroidism a. TSH this admission 7.1 b. Continue levothyroxine 75 daily and needs recheck in 3 weeks as an outpatient 8. CVA 2013 a. On Eliquis 9. Prior herpes simplex 2012 10. Seizure disorder-not currently on meds 11. Iron deficiency anemia  DVT prophylaxis: Eliquis Code Status: Full Family Communication: None present at bedside Disposition:  Status is: Inpatient  Remains inpatient appropriate because:Hemodynamically unstable and Persistent severe electrolyte disturbances   Dispo: The patient is from: SNF              Anticipated d/c is to: SNF              Anticipated d/c date is: 1 day              Patient currently  is not medically stable to d/c.    Consultants:   Renal  Procedures: None  Antimicrobials: None   Subjective:  Feels potentially a little anxious and some short of breath No chest pain no fever Tells me she cannot take a full breath without  feeling short winded No fever no chills no nausea no vomiting  Objective: Vitals:   04/20/20 0306 04/20/20 0757 04/20/20 1128 04/20/20 1336  BP: (!) 120/92 113/82 125/90   Pulse: 97 (!) 109 83 93  Resp: 18 18 20 20   Temp: 98.2 F (36.8 C) 97.8 F (36.6 C) 97.6 F (36.4 C)   TempSrc: Oral Oral    SpO2: 99% 100% 99% 100%  Weight: 66 kg     Height:        Intake/Output Summary (Last 24 hours) at 04/20/2020 1530 Last data filed at 04/20/2020 1400 Gross per 24 hour  Intake 1569.03 ml  Output 1050 ml  Net 519.03 ml   Filed Weights   04/18/20 0933 04/19/20 0601 04/20/20 0306  Weight: 66.4 kg 68.9 kg 66 kg    Examination:  EOMI NCAT no focal deficit CTA B decreased air entry but no added sound S1-S2 no murmur no rub no gallop seems to be in A. fib rate controlled Abdomen soft no rebound does have colostomy bag right side No lower extremity edema no rales no rhonchi   Data Reviewed: I have personally reviewed following labs and imaging studies Sodium 117-at >127 chloride 97 CO2 20 BUNs/creatinine 12/0.6 WBC down from 16-->14-->12  platelet 336  Radiology Studies: No results found.   Scheduled Meds: . (feeding supplement) PROSource Plus  30 mL Oral BID BM  . amiodarone  200 mg Oral Daily  . apixaban  5 mg Oral BID  . folic acid  1 mg Oral Daily  . hydrocerin  1 application Topical BID  . levothyroxine  75 mcg Oral Q0600  . metoprolol succinate  50 mg Oral BID AC  . midodrine  2.5 mg Oral TID WC  . multivitamin with minerals  1 tablet Oral Daily  . pantoprazole  40 mg Oral Daily  . sodium chloride flush  3 mL Intravenous Q12H  . sodium chloride  1 g Oral BID WC   Continuous Infusions: . sodium chloride    . sodium chloride 40 mL/hr at 04/20/20 1221     LOS: 2 days    Time spent: 20  34, MD [facility] To contact the attending provider between 7A-7P or the covering provider during after hours 7P-7A, please log into the web site  [email or web address] and access using universal Joes password for that web site. If you do not have the password, please call the hospital operator.  04/20/2020, 3:30 PM

## 2020-04-20 NOTE — Care Management Important Message (Signed)
Important Message  Patient Details  Name: [name] MRN: [id number] Date of Birth: 04-09-1946   Medicare Important Message Given:  Yes     [name] 04/20/2020, 12:24 PM

## 2020-04-20 NOTE — Plan of Care (Signed)

## 2020-04-20 NOTE — Evaluation (Signed)
Physical Therapy Evaluation Patient Details Name: [name] MRN: [id number] DOB: 17-Jan-1946 Today's Date: 04/20/2020   History of Present Illness  presented to ER from STR secondary to SOB, LE edema; admitted for management of chronic respiratory failure due to CHF.  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  Clinical Impression  Upon evaluation, patient alert and oriented; follows commands and agreeable to participation with session.  Moderately SOB with conversation; sats >94% on 2L.  Bilat UE/LE generally weak and deconditioned; however, adequate for basic transfers and gait efforts.  No focal weakness appreciated.  Able to complete bed mobility with min assist; sit/stand, basic transfers and gait (5' x3) with RW, min assist.  Demonstrates short, choppy steps; cuing for walker position and management (tends to release grasp when approaching seating surfaces).  Fatigues easily; mod SOB with activity (sats >94% on 2L).  Additional gait/activity deferred as result; will continue to assess/progress as able. Would benefit from skilled PT to address above deficits and promote optimal return to PLOF.; recommend transition to STR upon discharge from acute hospitalization.    Follow Up Recommendations SNF    Equipment Recommendations       Recommendations for Other Services       Precautions / Restrictions Precautions Precautions: Fall Precaution Comments: R quadrant ostomy; laparotomy abdominal wound Restrictions Weight Bearing Restrictions: No      Mobility  Bed Mobility Overal bed mobility: Needs Assistance Bed Mobility: Supine to Sit     Supine to sit: Min assist          Transfers Overall transfer level: Needs assistance Equipment used: Rolling walker (2 wheeled) Transfers: Sit to/from Stand Sit to Stand: Min assist          General transfer comment: cuing for hand placement; uses of rocking/momentum to assist with weight shift and lift off  Ambulation/Gait Ambulation/Gait assistance: Min guard Gait Distance (Feet):  (5' x3) Assistive device: Rolling walker (2 wheeled)       General Gait Details: short, choppy steps; cuing for walker position and management (tends to release grasp when approaching seating surfaces).  Fatigues easily; mod SOB with activity (sats >94% on 2L)  Stairs            Wheelchair Mobility    Modified Rankin (Stroke Patients Only)       Balance Overall balance assessment: Needs assistance Sitting-balance support: No upper extremity supported;Feet supported Sitting balance-Leahy Scale: Good     Standing balance support: Bilateral upper extremity supported Standing balance-Leahy Scale: Fair Standing balance comment: decreased functional reach evident; does require UE support to maintain balance, esp with movement outside immediate BOS                             Pertinent Vitals/Pain Pain Assessment: No/denies pain    Home Living Family/patient expects to be discharged to:: Skilled nursing facility Living Arrangements: Alone Available Help at Discharge: Family;Available PRN/intermittently Type of Home: Apartment Home Access: Level entry     Home Layout: One level Home Equipment: Cane - quad      Prior Function Level of Independence: Needs assistance         Comments: Mod indep with ADLs, household mobilization upon discharge; family assists with groceries and community errands, neighbors/chuch family provides daily check in.  Denies fall history; no home O2. Has most recently been  at [facility] for [employer] after recent hospitalizations (was working towards ambulation wiht 4WRW)     Hand Dominance   Dominant Hand: Right    Extremity/Trunk Assessment   Upper Extremity Assessment Upper Extremity Assessment: Overall WFL for tasks  assessed    Lower Extremity Assessment Lower Extremity Assessment: Generalized weakness (grossly 4-/5 throughout)       Communication   Communication: No difficulties  Cognition Arousal/Alertness: Awake/alert Behavior During Therapy: WFL for tasks assessed/performed Overall Cognitive Status: Within Functional Limits for tasks assessed                                        General Comments      Exercises Other Exercises Other Exercises: toilet transfer, SPT with RW, min assist; sit/stand from [facility] with RW, min assist; standing balance for hygiene, peri-care, cga with RW. Do recommend continued use of RW; encouraged progression to [facility] for toileting needs (instead of purewick) to facilitate optimal bladder control and optimal mobilization efforts.  Patient voiced understanding.   Assessment/Plan    PT Assessment Patient needs continued PT services  PT Problem List Decreased strength;Decreased mobility;Decreased activity tolerance;Cardiopulmonary status limiting activity;Decreased balance;Pain;Decreased skin integrity       PT Treatment Interventions Gait training;Functional mobility training;Therapeutic activities;Therapeutic exercise;Balance training;Neuromuscular re-education;Patient/family education;DME instruction    PT Goals (Current goals can be found in the Care Plan section)  Acute Rehab PT Goals Patient Stated Goal: to go back to [facility] to finish my therapy PT Goal Formulation: With patient Time For Goal Achievement: 05/04/20 Potential to Achieve Goals: Good    Frequency Min 2X/week   Barriers to discharge Decreased caregiver support      Co-evaluation               AM-PAC PT "6 Clicks" Mobility  Outcome Measure Help needed turning from your back to your side while in a flat bed without using bedrails?: None Help needed moving from lying on your back to sitting on the side of a flat bed without using bedrails?: A Little Help  needed moving to and from a bed to a chair (including a wheelchair)?: A Little Help needed standing up from a chair using your arms (e.g., wheelchair or bedside chair)?: A Little Help needed to walk in hospital room?: A Little Help needed climbing 3-5 steps with a railing? : A Lot 6 Click Score: 18    End of Session Equipment Utilized During Treatment: Gait belt Activity Tolerance: Patient tolerated treatment well Patient left: in chair;with call bell/phone within reach;with chair alarm set Nurse Communication: Mobility status PT Visit Diagnosis: Unsteadiness on feet (R26.81);Muscle weakness (generalized) (M62.81)    Time: 7322-0254 PT Time Calculation (min) (ACUTE ONLY): 32 min   Charges:   PT Evaluation $PT Eval Moderate Complexity: 1 Mod PT Treatments $Therapeutic Activity: 8-22 mins       Kenna Kirn H. Manson Passey, PT, DPT, NCS 04/20/20, 2:53 PM [phone]

## 2020-04-20 NOTE — Progress Notes (Signed)
OT Cancellation Note  Patient Details Name: [name] MRN: [id number] DOB: 14-Oct-1945   Cancelled Treatment:    Reason Eval/Treat Not Completed: Fatigue/lethargy limiting ability to participate. OT order received and chart reviewed. Upon arrival pt seated in recliner reporting fatigue following PT. Requesting session later this date. Will follow and initiate services at later time/date as able.   Kathie Dike, M.S. OTR/L  04/20/20, 10:58 AM  ascom [phone]

## 2020-04-20 NOTE — [place] FL2 (Signed)
[place] MEDICAID FL2 LEVEL OF CARE SCREENING TOOL     IDENTIFICATION  Patient Name: [name] Birthdate: 09-10-1945 Sex: female Admission Date (Current Location): 04/18/2020  [facility] and [place] Number:  Chiropodist and Address:  [facility], [address], [place], [place] [address]      Provider Number: [id number]  Attending Physician Name and Address:  Rhetta Mura, MD  Relative Name and Phone Number:       Current Level of Care: Hospital Recommended Level of Care: Skilled Nursing Facility Prior Approval Number:    Date Approved/Denied:   PASRR Number:    Discharge Plan: SNF    Current Diagnoses: Patient Active Problem List   Diagnosis Date Noted  . Leukocytosis 04/18/2020  . GERD (gastroesophageal reflux disease) 04/18/2020  . Acute CHF (congestive heart failure) (HCC) 04/09/2020  . Nocturnal hypoxia   . Atrial fibrillation, chronic (HCC)   . Weakness   . Severe sepsis (HCC) 03/29/2020  .  Possible Abscess of postoperative wound of abdominal wall 03/29/2020  . S/P laparotomy 03/12/20  03/29/2020  . History of CVA (cerebrovascular accident) 03/29/2020  . Hypotension   . Malnutrition of moderate degree 03/19/2020  . Hyponatremia 03/12/2020  . Chronic diastolic heart failure (HCC) 03/12/2020  . Large bowel obstruction (HCC) 03/12/2020  . Abdominal distention 03/11/2020  . Iron deficiency anemia 12/20/2019  . Acute on chronic diastolic CHF (congestive heart failure) (HCC) 12/04/2019  . Atrial fibrillation with RVR (HCC)   . Hypertension   . Stroke (HCC)   . Hypothyroidism   . Acute respiratory failure with hypoxia (HCC)   . Acute congestive heart failure (HCC) 03/17/2019  . Acute gastroenteritis 07/05/2017  . Chronic respiratory failure with hypoxia (HCC) 03/27/2011    Orientation RESPIRATION BLADDER Height & Weight     Self, Time, Situation, Place  O2 ([place] 2L) External catheter, Incontinent (placed  11/08) Weight: 145 lb 8 oz (66 kg) Height:  5\' 5"  (165.1 cm)  BEHAVIORAL SYMPTOMS/MOOD NEUROLOGICAL BOWEL NUTRITION STATUS      Ileostomy Diet (2 gram sodium diet, fluid restriction 1200 mL)  AMBULATORY STATUS COMMUNICATION OF NEEDS Skin   Limited Assist Verbally Surgical wounds (closed incision on abdomen)                       Personal Care Assistance Level of Assistance  Dressing, Feeding, Bathing Bathing Assistance: Limited assistance Feeding assistance: Independent Dressing Assistance: Limited assistance     Functional Limitations Info  Sight, Hearing, Speech Sight Info: Adequate Hearing Info: Adequate Speech Info: Adequate    SPECIAL CARE FACTORS FREQUENCY  PT (By licensed PT), OT (By licensed OT)     PT Frequency: 5x OT Frequency: 5x            Contractures Contractures Info: Not present    Additional Factors Info  Code Status, Allergies Code Status Info: Full Code Allergies Info: Prednisone, Predicort (Prednisolone Acetate)           Current Medications (04/20/2020):  This is the current hospital active medication list Current Facility-Administered Medications  Medication Dose Route Frequency Provider Last Rate Last Admin  . (feeding supplement) PROSource Plus liquid 30 mL  30 mL Oral BID BM 04/22/2020, MD   30 mL at 04/20/20 0921  . 0.9 %  sodium chloride infusion  250 mL Intravenous PRN 04/22/20, MD      . 0.9 %  sodium chloride infusion   Intravenous Continuous Samtani,  Jai-Gurmukh, MD 40 mL/hr at 04/20/20 1221 Rate Change at 04/20/20 1221  . albuterol (PROVENTIL) (2.5 MG/3ML) 0.083% nebulizer solution 2.5 mg  2.5 mg Inhalation Q4H PRN Lorretta Harp, MD      . amiodarone (PACERONE) tablet 200 mg  200 mg Oral Daily Lorretta Harp, MD   200 mg at 04/20/20 0917  . apixaban (ELIQUIS) tablet 5 mg  5 mg Oral BID Lorretta Harp, MD   5 mg at 04/20/20 0916  . dextromethorphan-guaiFENesin (MUCINEX DM) 30-600 MG per 12 hr tablet 1 tablet  1 tablet Oral BID PRN Lorretta Harp, MD      . folic acid (FOLVITE) tablet 1 mg  1 mg Oral Daily Lorretta Harp, MD   1 mg at 04/20/20 0916  . hydrALAZINE (APRESOLINE) injection 5 mg  5 mg Intravenous Q2H PRN Lorretta Harp, MD      . hydrocerin (EUCERIN) cream 1 application  1 application Topical BID Lorretta Harp, MD   1 application at 04/20/20 [phone]  . levothyroxine (SYNTHROID) tablet 75 mcg  75 mcg Oral Q0600 Lorretta Harp, MD   75 mcg at 04/20/20 0529  . LORazepam (ATIVAN) injection 1 mg  1 mg Intravenous Q2H PRN Lorretta Harp, MD      . metoprolol succinate (TOPROL-XL) 24 hr tablet 50 mg  50 mg Oral BID AC Lorretta Harp, MD   50 mg at 04/20/20 0900  . midodrine (PROAMATINE) tablet 2.5 mg  2.5 mg Oral TID WC Lorretta Harp, MD   2.5 mg at 04/20/20 1221  . multivitamin with minerals tablet 1 tablet  1 tablet Oral Daily Lorretta Harp, MD   1 tablet at 04/20/20 0917  . pantoprazole (PROTONIX) EC tablet 40 mg  40 mg Oral Daily Lorretta Harp, MD   40 mg at 04/20/20 0917  . sodium chloride flush (NS) 0.9 % injection 3 mL  3 mL Intravenous Q12H Lorretta Harp, MD   3 mL at 04/20/20 0920  . sodium chloride flush (NS) 0.9 % injection 3 mL  3 mL Intravenous PRN Lorretta Harp, MD      . sodium chloride tablet 1 g  1 g Oral BID WC Rhetta Mura, MD   1 g at 04/20/20 0900     Discharge Medications: Please see discharge summary for a list of discharge medications.  Relevant Imaging Results:  Relevant Lab Results:   Additional Information SSN:[id number]  Maree Krabbe, LCSW

## 2020-04-20 NOTE — Progress Notes (Signed)
Mobility Specialist - Progress Note   04/20/20 1648  Mobility  Range of Motion/Exercises Right leg;Left leg (ankle pumps, quad set, slr, hip abd/add, hip iso)  Level of Assistance Independent  Assistive Device None  Distance Ambulated (ft) 0 ft  Mobility Response Tolerated well  Mobility performed by Mobility specialist  $Mobility charge 1 Mobility    Pre-mobility: 103 HR, 96% SpO2 Post-mobility: 112 HR, 100% SpO2   Pt was lying in bed upon arrival utilizing 2L [place] O2. Pt agreed to session. Pt denied pain, nausea, or fatigue. Noted labored breathing as pt engages in conversation. O2 sat at 96%. Pt performed exercises: straight leg raises, quad sets, hip abd/add, hip isometrics, and ankle pumps independently. Pt took 2 short rest breaks in between exercises d/t fatigue. Overall, pt tolerated session well. Pt was left in bed with all needs in reach and alarm set.    Filiberto Pinks Mobility Specialist 04/20/20, 4:52 PM

## 2020-04-20 NOTE — Evaluation (Signed)
Occupational Therapy Evaluation Patient Details Name: [name] MRN: [id number] DOB: [birth year] Today's Date: 04/20/2020    History of Present Illness presented to ER from STR secondary to SOB, LE edema; admitted for management of chronic respiratory failure due to CHF.  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   Clinical Impression    Pt presents today alert, oriented, agreeable to OT session. Describes mild pain in buttocks and back of legs. Becomes SOB with conversation, although O2 sats remain at 98% on room air. Pt requires Min A to come into standing from sit, ambulation with RW and CGA, toileting in bathroom CGA for clothing mgmt and peri-care, Min A to come to standing, CGA for grooming while standing at sink. Slow, shuffling gait but no LOB. Pt able to get into bed without physical asst and stated she was very pleased, saying this was first time in a long time that she didn't need someone to help lift her 2nd leg onto the bed. Pt comes to this hospitalization from [facility]. Prior to that, she lived alone in a senior living community affiliated with her church, with her family and neighbors checking in with her daily. She attends church 2x/week, performs grooming, toileting, bathing on her own, although reports that she has had increasing trouble getting into/out of the bathtub on her own (hitting knees on side of tub). She has grab bars but no shower chair in the tub. She reports no falls in the previous 12 months. Pt uses a QC for ambulation; however, this therapist believes RW would enhance this pt's safety and reduce falls risk. Pt will benefit from OT to facilitate return to PLOF. Post hospital DC, recommend SNF, to continue improving strength, endurance, functional mobility; and maximizing safety.    Follow Up Recommendations   SNF    Equipment Recommendations  Tub/shower bench    Recommendations for Other Services       Precautions / Restrictions Precautions Precautions: Fall Precaution Comments: R quadrant ostomy; laparotomy abdominal wound Restrictions Weight Bearing Restrictions: No      Mobility Bed Mobility Overal bed mobility: Modified Independent Bed Mobility: Sit to Supine;Rolling Rolling: Modified independent (Device/Increase time)   Supine to sit: Min assist Sit to supine: Modified independent (Device/Increase time)   General bed mobility comments: Pt able to get both legs into bed w/o physical assist    Transfers Overall transfer level: Needs assistance Equipment used: Rolling walker (2 wheeled) Transfers: Sit to/from Stand Sit to Stand: Min assist         General transfer comment: Requires slight assist from therapist to transition sit<stand    Balance Overall balance assessment: Needs assistance Sitting-balance support: No upper extremity supported;Feet supported Sitting balance-Leahy Scale: Good Sitting balance - Comments: no loss of balance in sitting position    Standing balance support: Bilateral upper extremity supported Standing balance-Leahy Scale: Fair Standing balance comment: decreased functional reach evident; does require UE support to maintain balance, esp with movement outside immediate BOS                           ADL either performed or assessed with clinical judgement   ADL Overall ADL's : Needs assistance/impaired Eating/Feeding: Modified independent;Set up   Grooming: Wash/dry face;Wash/dry hands;Standing;Min guard (with RW)  Toilet Transfer: Regular Toilet;RW;Min guard;Minimal assistance;Ambulation;Grab bars   Toileting- Clothing Manipulation and Hygiene: Min guard;Sit to/from stand       Functional mobility during ADLs: Min guard;Rolling walker       Vision Baseline Vision/History: Wears glasses Wears  Glasses: At all times Patient Visual Report: No change from baseline       Perception     Praxis      Pertinent Vitals/Pain Pain Assessment: 0-10 Pain Score: 3  Pain Location: back of legs and buttocks Pain Intervention(s): Monitored during session;Repositioned     Hand Dominance Right   Extremity/Trunk Assessment Upper Extremity Assessment Upper Extremity Assessment: Generalized weakness   Lower Extremity Assessment Lower Extremity Assessment: Generalized weakness   Cervical / Trunk Assessment Cervical / Trunk Assessment: Kyphotic   Communication Communication Communication: No difficulties   Cognition Arousal/Alertness: Awake/alert Behavior During Therapy: WFL for tasks assessed/performed Overall Cognitive Status: Within Functional Limits for tasks assessed                                 General Comments: Slowed processing   General Comments  LE edema    Exercises Other Exercises Other Exercises: toilet transfer, SPT with RW, min assist; sit/stand from [facility] with RW, min assist; standing balance for hygiene, peri-care, cga with RW. Do recommend continued use of RW; encouraged progression to [facility] for toileting needs (instead of purewick) to facilitate optimal bladder control and optimal mobilization efforts.  Patient voiced understanding. Other Exercises: Bed mobility, transfers, ambulation w/in room, toileting, grooming. Educ re: POC, OOB mobility, breathing techniques, anxiety mgmt.   Shoulder Instructions      Home Living Family/patient expects to be discharged to:: Skilled nursing facility Living Arrangements: Alone Available Help at Discharge: Family;Available PRN/intermittently Type of Home: Apartment Home Access: Level entry     Home Layout: One level     Bathroom Shower/Tub: Chief Strategy Officer: Standard     Home Equipment: Cane - quad   Additional Comments: PT at [facility] recommending transition to 4WW      Prior  Functioning/Environment Level of Independence: Needs assistance  Gait / Transfers Assistance Needed: Uses QC for community ambulation, no falls reported. Does not drive. ADL's / Homemaking Assistance Needed: Family assists with groceries once a month and clipping pt toenails. Does not require assist for other ADL. facility manages med mgt and meals.   Comments: Mod indep with ADLs, household mobilization upon discharge; family assists with groceries and community errands, neighbors/chuch family provides daily check in.  Denies fall history; no home O2. Has most recently been at [facility] for [employer] after recent hospitalizations (was working towards ambulation wiht 4WRW)        OT Problem List: Decreased strength;Decreased activity tolerance;Impaired balance (sitting and/or standing);Decreased knowledge of use of DME or AE;Cardiopulmonary status limiting activity      OT Treatment/Interventions: Self-care/ADL training;DME and/or AE instruction;Therapeutic activities;Balance training;Therapeutic exercise;Patient/family education;Energy conservation    OT Goals(Current goals can be found in the care plan section) Acute Rehab OT Goals Patient Stated Goal: to breath better OT Goal Formulation: With patient Time For Goal Achievement: 04/24/20 Potential to Achieve Goals: Good ADL Goals Pt Will Transfer to Toilet: with supervision (using LRAD) Pt Will Perform Toileting - Clothing Manipulation and hygiene: with supervision Pt Will Perform Tub/Shower Transfer: with supervision (using LRAD and displaying good safety awareness)  OT Frequency: Min 1X/week   Barriers to D/C:  Co-evaluation              AM-PAC OT "6 Clicks" Daily Activity     Outcome Measure Help from another person eating meals?: None Help from another person taking care of personal grooming?: A Little Help from another person toileting, which includes using toliet, bedpan, or urinal?: A Little Help from  another person bathing (including washing, rinsing, drying)?: A Lot Help from another person to put on and taking off regular upper body clothing?: A Little Help from another person to put on and taking off regular lower body clothing?: A Lot 6 Click Score: 17   End of Session Equipment Utilized During Treatment: Rolling walker  Activity Tolerance: Patient tolerated treatment well Patient left: in bed;with call bell/phone within reach;with bed alarm set;with nursing/sitter in room  OT Visit Diagnosis: Unsteadiness on feet (R26.81);Muscle weakness (generalized) (M62.81)                Time: 4268-3419 OT Time Calculation (min): 34 min Charges:  OT General Charges $OT Visit: 1 Visit OT Evaluation $OT Eval Low Complexity: 1 Low OT Treatments $Self Care/Home Management : 23-37 mins  Latina Craver, PhD, MS, OTR/L ascom [phone] 04/20/20, 3:15 PM

## 2020-04-21 ENCOUNTER — Inpatient Hospital Stay: Payer: Medicare Other

## 2020-04-21 LAB — COMPREHENSIVE METABOLIC PANEL
ALT: 44 U/L (ref 0–44)
AST: 31 U/L (ref 15–41)
Albumin: 3.1 g/dL — ABNORMAL LOW (ref 3.5–5.0)
Alkaline Phosphatase: 107 U/L (ref 38–126)
Anion gap: 8 (ref 5–15)
BUN: 13 mg/dL (ref 8–23)
CO2: 23 mmol/L (ref 22–32)
Calcium: 8.7 mg/dL — ABNORMAL LOW (ref 8.9–10.3)
Chloride: 97 mmol/L — ABNORMAL LOW (ref 98–111)
Creatinine, Ser: 0.64 mg/dL (ref 0.44–1.00)
GFR, Estimated: >60 mL/min (ref >60)
Glucose, Bld: 108 mg/dL — ABNORMAL HIGH (ref 70–99)
Potassium: 4.1 mmol/L (ref 3.5–5.1)
Sodium: 128 mmol/L — ABNORMAL LOW (ref 135–145)
Total Bilirubin: 0.5 mg/dL (ref 0.3–1.2)
Total Protein: 6.5 g/dL (ref 6.5–8.1)

## 2020-04-21 LAB — CBC WITH DIFFERENTIAL/PLATELET
Abs Immature Granulocytes: 0.33 10*3/uL — ABNORMAL HIGH (ref 0.00–0.07)
Basophils Absolute: 0.1 10*3/uL (ref 0.0–0.1)
Basophils Relative: 1 %
Eosinophils Absolute: 0.1 10*3/uL (ref 0.0–0.5)
Eosinophils Relative: 1 %
HCT: 35.5 % — ABNORMAL LOW (ref 36.0–46.0)
Hemoglobin: 11.1 g/dL — ABNORMAL LOW (ref 12.0–15.0)
Immature Granulocytes: 2 %
Lymphocytes Relative: 11 %
Lymphs Abs: 1.7 10*3/uL (ref 0.7–4.0)
MCH: 25 pg — ABNORMAL LOW (ref 26.0–34.0)
MCHC: 31.3 g/dL (ref 30.0–36.0)
MCV: 80 fL (ref 80.0–100.0)
Monocytes Absolute: 1 10*3/uL (ref 0.1–1.0)
Monocytes Relative: 7 %
Neutro Abs: 11.5 10*3/uL — ABNORMAL HIGH (ref 1.7–7.7)
Neutrophils Relative %: 78 %
Platelets: 306 10*3/uL (ref 150–400)
RBC: 4.44 MIL/uL (ref 3.87–5.11)
RDW: 16.2 % — ABNORMAL HIGH (ref 11.5–15.5)
WBC: 14.8 10*3/uL — ABNORMAL HIGH (ref 4.0–10.5)
nRBC: 0.1 % (ref 0.0–0.2)

## 2020-04-21 LAB — OSMOLALITY, URINE: Osmolality, Ur: 449 mOsm/kg (ref 300–900)

## 2020-04-21 LAB — SODIUM, URINE, RANDOM: Sodium, Ur: 34 mmol/L

## 2020-04-21 MED ORDER — FUROSEMIDE 20 MG PO TABS
20.0000 mg | ORAL_TABLET | ORAL | Status: DC
  Administered 2020-04-21 – 2020-04-23 (×2): 20 mg via ORAL
  Filled 2020-04-21 (×4): qty 1

## 2020-04-21 NOTE — Progress Notes (Signed)
PROGRESS NOTE   [name]  CZY:[id number] DOB: June 04, 1945 DOA: 04/18/2020 PCP: Mickel Fuchs, MD  Brief Narrative:  28 white female ?  Sigmoid obstruction 01/2020 resulting in eventual SBO subtotal colectomy + end ileostomy10/04/2020 -readmit 1027-11/2 severe sepsis 2/2 E. coli, Enterococcus from seroma-nonoperative management-discharged on Augmentin Readmitted again 11/7-11/12 hypoxic respiratory failure 2/2 AECHF/large bilateral pleural effusions P A. 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 at that time Also chronic respiratory failure on 2 L oxygen at night Admitted 11/7 through 04/13/2020 with acute exacerbation HFpEF-discharge weight at that time 64 kg baseline seems to be around 67 Presented from skilled nursing facility 11/17 SOB-usually uses 2 L at night Emergency room work-up BNP 884 troponin VII Sodium 117 Blood pressure 102/71 O2 sat 96 on 2 L CXR = cardiomegaly vascular congestion small pleural effusion  Secondary to low sodium nephrology was consulted  Assessment & Plan:   Principal Problem:   Acute on chronic diastolic CHF (congestive heart failure) (HCC) Active Problems:   Chronic respiratory failure with hypoxia (HCC)   Hypertension   Stroke (HCC)   Hypothyroidism   Hyponatremia   Atrial fibrillation, chronic (HCC)   Leukocytosis   GERD (gastroesophageal reflux disease)   1. Acute respiratory failure superimposed on chronic respiratory failure using 2 L a. Apparently not consistently using oxygen at home (uses only at night) b. need 2 L oxygen going forward at all times 2. Acute superimposed on chronic HFpEF a. Review of weights from home health visit Dr. Dareen Piano 11/3 revealed probably 15 pound weight gain from that home visit compared to prior  10/26 when she was discharged b. Lasix initially held as felt patient was euvolemic c. Given IV Lasix 20 mg is feeling somewhat more short of breath with acute heart failure on 11/19 d. Await 2vw CXR 3. Hypervolemic hyponatremia a.  sodium is improving steadily but unfortunately holding fluids 11/19 b. Discontinued saline 40 cc/h 11/19 c. Continue 1 g salt tabs twice daily d. Resume low-dose diuretics as lasix 20 qod po 11/20 4. Atrial fibrillation CHADS2 score >7 on Eliquis a. No reports of bleeding stool seems regular brown continue Eliquis 5 twice daily b. Continue Toprol XL 50, continue amiodarone 200 daily 5. Hypotension in the setting of hypertension a. Blood pressure normalizing b. Is also on midodrine to help support her blood pressure  6. Recent subtotal colectomy, end ileostomy and E. coli enterococcal infection subsequently a. Will need extensive rehab and follow-up in the outpatient setting and return facility b. Continue ostomy care and also 7. Hypothyroidism a. TSH this admission 7.1 b. Continue levothyroxine 75 daily and needs recheck in 3 weeks as an outpatient 8. CVA 2013 a. On Eliquis 9. Prior herpes simplex 2012 10. Seizure disorder-not currently on meds 11. Iron deficiency anemia  DVT prophylaxis: Eliquis Code Status: Full Family Communication: None present at bedside Disposition:  Status is: Inpatient  Remains inpatient appropriate because:Hemodynamically unstable and Persistent severe electrolyte disturbances   Dispo: The patient is from: SNF              Anticipated d/c is to: SNF              Anticipated d/c date is: 1 day              Patient currently is not  medically stable to d/c.    Consultants:   Renal  Procedures: None  Antimicrobials: None   Subjective:  Coherent pleasant and in nad no focal deficit No cp Asks about anxiolytic Some N but no vomit today  Objective: Vitals:   04/21/20 0500 04/21/20 0756 04/21/20 0846 04/21/20  1121  BP:  114/82 118/79 116/88  Pulse:  (!) 108 100 (!) 110  Resp:  17 18   Temp:  98.1 F (36.7 C)    TempSrc:      SpO2:  98% 100% 97%  Weight: 68.6 kg     Height:        Intake/Output Summary (Last 24 hours) at 04/21/2020 1232 Last data filed at 04/21/2020 0500 Gross per 24 hour  Intake 590 ml  Output 1350 ml  Net -760 ml   Filed Weights   04/19/20 0601 04/20/20 0306 04/21/20 0500  Weight: 68.9 kg 66 kg 68.6 kg    Examination:  EOMI NCAT no focal deficit pleasant slightly slow speech at times CTA B clear without added sound S1-S2 no murmur A. fib rate controlled Abdomen soft no rebound does have colostomy bag right side No lower extremity edema no rales no rhonchi   Data Reviewed: I have personally reviewed following labs and imaging studies Sodium 117-at >128 chloride 97 CO2 23 BUNs/creatinine 12/0.6--13/0.64 WBC  16-->14  platelet 336  Radiology Studies: No results found.   Scheduled Meds: . (feeding supplement) PROSource Plus  30 mL Oral BID BM  . amiodarone  200 mg Oral Daily  . apixaban  5 mg Oral BID  . folic acid  1 mg Oral Daily  . hydrocerin  1 application Topical BID  . levothyroxine  75 mcg Oral Q0600  . metoprolol succinate  50 mg Oral BID AC  . midodrine  2.5 mg Oral TID WC  . multivitamin with minerals  1 tablet Oral Daily  . pantoprazole  40 mg Oral Daily  . sodium chloride flush  3 mL Intravenous Q12H  . sodium chloride  1 g Oral BID WC   Continuous Infusions: . sodium chloride    . sodium chloride 40 mL/hr at 04/20/20 1221     LOS: 3 days    Time spent: 66  Rhetta Mura, MD [facility] To contact the attending provider between 7A-7P or the covering provider during after hours 7P-7A, please log into the web site [email or web address] and access using universal [place] password for that web site. If you do not have the password, please call the hospital operator.  04/21/2020, 12:32 PM

## 2020-04-21 NOTE — Progress Notes (Signed)
Lauren HildingMaxine B Hood  MRN: [id number]  DOB/AGE: 76/10/1945 76 y.o.  Primary Care Physician:Wroth, Sydnee Cabalhomas H, MD  Admit date: 04/18/2020  Chief Complaint:  Chief Complaint  Patient presents with  . Shortness of Breath    S-Pt presented on  04/18/2020 with  Chief Complaint  Patient presents with  . Shortness of Breath  . Patient offers no new physical complaints in today's visit. Patient says she is feeling better. Patient main concern in today visit was if she is going to be discharged?  Patient then added the detail that she wants to go home as they were asking her son $8000 to hold the room. I then discussed patient's electrolyte/kidney related issues to the best of my ability       Medications . (feeding supplement) PROSource Plus  30 mL Oral BID BM  . amiodarone  200 mg Oral Daily  . apixaban  5 mg Oral BID  . folic acid  1 mg Oral Daily  . furosemide  20 mg Oral QODAY  . hydrocerin  1 application Topical BID  . levothyroxine  75 mcg Oral Q0600  . metoprolol succinate  50 mg Oral BID AC  . midodrine  2.5 mg Oral TID WC  . multivitamin with minerals  1 tablet Oral Daily  . pantoprazole  40 mg Oral Daily  . sodium chloride flush  3 mL Intravenous Q12H  . sodium chloride  1 g Oral BID WC         WUJ:WJXBJROS:apart from the symptoms mentioned above,there are no other symptoms referable to all systems reviewed.  Physical Exam: Vital signs in last 24 hours: Temp:  [97.4 F (36.3 C)-98.2 F (36.8 C)] 98.1 F (36.7 C) (11/20 0756) Pulse Rate:  [93-110] 110 (11/20 1121) Resp:  [17-19] 18 (11/20 0846) BP: (109-126)/(79-97) 116/88 (11/20 1121) SpO2:  [97 %-100 %] 97 % (11/20 1121) Weight:  [68.6 kg] 68.6 kg (11/20 0500) Weight change: 2.602 kg Last BM Date: 04/20/20  Intake/Output from previous day: 11/19 0701 - 11/20 0700 In: 1083 [P.O.:1080; I.V.:3] Out: 1950 [Urine:1950] No intake/output data recorded.   Physical Exam: General- pt is awake,alert, oriented to time  place and person  Resp- No acute REsp distress, decreased at basesi  CVS- S1S2 regular in rate and rhythm  GIT- BS+, soft, Non tender , Non distended  EXT- NO LE Edema, Cyanosis    Lab Results:  CBC  Recent Labs    04/20/20 0405 04/21/20 0408  WBC 12.0* 14.8*  HGB 11.1* 11.1*  HCT 35.6* 35.5*  PLT 336 306    BMET  Recent Labs    04/20/20 0405 04/21/20 0408  NA 127* 128*  K 4.3 4.1  CL 97* 97*  CO2 20* 23  GLUCOSE 96 108*  BUN 12 13  CREATININE 0.67 0.64  CALCIUM 8.3* 8.7*      Most recent Creatinine trend  Lab Results  Component Value Date   CREATININE 0.64 04/21/2020   CREATININE 0.67 04/20/2020   CREATININE 0.73 04/19/2020      MICRO   Recent Results (from the past 240 hour(s))  Respiratory Panel by RT PCR (Flu A&B, Covid) - Nasopharyngeal Swab     Status: None   Collection Time: 04/13/20 11:46 AM   Specimen: Nasopharyngeal Swab  Result Value Ref Range Status   SARS Coronavirus 2 by RT PCR NEGATIVE NEGATIVE Final    Comment: (NOTE) SARS-CoV-2 target nucleic acids are NOT DETECTED.  The SARS-CoV-2 RNA is generally detectable in upper  respiratoy specimens during the acute phase of infection. The lowest concentration of SARS-CoV-2 viral copies this assay can detect is 131 copies/mL. A negative result does not preclude SARS-Cov-2 infection and should not be used as the sole basis for treatment or other patient management decisions. A negative result may occur with  improper specimen collection/handling, submission of specimen other than nasopharyngeal swab, presence of viral mutation(s) within the areas targeted by this assay, and inadequate number of viral copies (<131 copies/mL). A negative result must be combined with clinical observations, patient history, and epidemiological information. The expected result is Negative.  Fact Sheet for Patients:  https://www.moore.com/  Fact Sheet for Healthcare Providers:   https://www.young.biz/  This test is no t yet approved or cleared by the [place] FDA and  has been authorized for detection and/or diagnosis of SARS-CoV-2 by FDA under an Emergency Use Authorization (EUA). This EUA will remain  in effect (meaning this test can be used) for the duration of the COVID-19 declaration under Section 564(b)(1) of the Act, 21 U.S.C. section 360bbb-3(b)(1), unless the authorization is terminated or revoked sooner.     Influenza A by PCR NEGATIVE NEGATIVE Final   Influenza B by PCR NEGATIVE NEGATIVE Final    Comment: (NOTE) The Xpert Xpress SARS-CoV-2/FLU/RSV assay is intended as an aid in  the diagnosis of influenza from Nasopharyngeal swab specimens and  should not be used as a sole basis for treatment. Nasal washings and  aspirates are unacceptable for Xpert Xpress SARS-CoV-2/FLU/RSV  testing.  Fact Sheet for Patients: https://www.moore.com/  Fact Sheet for Healthcare Providers: https://www.young.biz/  This test is not yet approved or cleared by the [place] FDA and  has been authorized for detection and/or diagnosis of SARS-CoV-2 by  FDA under an Emergency Use Authorization (EUA). This EUA will remain  in effect (meaning this test can be used) for the duration of the  Covid-19 declaration under Section 564(b)(1) of the Act, 21  U.S.C. section 360bbb-3(b)(1), unless the authorization is  terminated or revoked. Performed at [facility], [address] [place], [place] [address]   Resp Panel by RT PCR (RSV, Flu A&B, Covid) - Nasopharyngeal Swab     Status: None   Collection Time: 04/18/20  8:44 AM   Specimen: Nasopharyngeal Swab  Result Value Ref Range Status   SARS Coronavirus 2 by RT PCR NEGATIVE NEGATIVE Final    Comment: (NOTE) SARS-CoV-2 target nucleic acids are NOT DETECTED.  The SARS-CoV-2 RNA is generally detectable in upper respiratoy specimens during the  acute phase of infection. The lowest concentration of SARS-CoV-2 viral copies this assay can detect is 131 copies/mL. A negative result does not preclude SARS-Cov-2 infection and should not be used as the sole basis for treatment or other patient management decisions. A negative result may occur with  improper specimen collection/handling, submission of specimen other than nasopharyngeal swab, presence of viral mutation(s) within the areas targeted by this assay, and inadequate number of viral copies (<131 copies/mL). A negative result must be combined with clinical observations, patient history, and epidemiological information. The expected result is Negative.  Fact Sheet for Patients:  https://www.moore.com/  Fact Sheet for Healthcare Providers:  https://www.young.biz/  This test is no t yet approved or cleared by the [place] FDA and  has been authorized for detection and/or diagnosis of SARS-CoV-2 by FDA under an Emergency Use Authorization (EUA). This EUA will remain  in effect (meaning this test can be used) for the duration of the COVID-19 declaration  under Section 564(b)(1) of the Act, 21 U.S.C. section 360bbb-3(b)(1), unless the authorization is terminated or revoked sooner.     Influenza A by PCR NEGATIVE NEGATIVE Final   Influenza B by PCR NEGATIVE NEGATIVE Final    Comment: (NOTE) The Xpert Xpress SARS-CoV-2/FLU/RSV assay is intended as an aid in  the diagnosis of influenza from Nasopharyngeal swab specimens and  should not be used as a sole basis for treatment. Nasal washings and  aspirates are unacceptable for Xpert Xpress SARS-CoV-2/FLU/RSV  testing.  Fact Sheet for Patients: https://www.moore.com/  Fact Sheet for Healthcare Providers: https://www.young.biz/  This test is not yet approved or cleared by the [place] FDA and  has been authorized for detection and/or diagnosis  of SARS-CoV-2 by  FDA under an Emergency Use Authorization (EUA). This EUA will remain  in effect (meaning this test can be used) for the duration of the  Covid-19 declaration under Section 564(b)(1) of the Act, 21  U.S.C. section 360bbb-3(b)(1), unless the authorization is  terminated or revoked.    Respiratory Syncytial Virus by PCR NEGATIVE NEGATIVE Final    Comment: (NOTE) Fact Sheet for Patients: https://www.moore.com/  Fact Sheet for Healthcare Providers: https://www.young.biz/  This test is not yet approved or cleared by the [place] FDA and  has been authorized for detection and/or diagnosis of SARS-CoV-2 by  FDA under an Emergency Use Authorization (EUA). This EUA will remain  in effect (meaning this test can be used) for the duration of the  COVID-19 declaration under Section 564(b)(1) of the Act, 21 U.S.C.  section 360bbb-3(b)(1), unless the authorization is terminated or  revoked. Performed at [facility], [address] [place], [place] [address]   Culture, blood (Routine X 2) w Reflex to ID Panel     Status: Abnormal (Preliminary result)   Collection Time: 04/18/20 10:26 AM   Specimen: BLOOD LEFT HAND  Result Value Ref Range Status   Specimen Description BLOOD LEFT HAND  Final   Special Requests (A)  Final    AEROCOCCUS SPECIES Blood Culture results may not be optimal due to an inadequate volume of blood received in culture bottles   Culture   Final    NO GROWTH 3 DAYS Performed at [facility], [address], [place], [place] [address]    Report Status PENDING  Incomplete  Culture, blood (Routine X 2) w Reflex to ID Panel     Status: None (Preliminary result)   Collection Time: 04/18/20 10:34 AM   Specimen: BLOOD  Result Value Ref Range Status   Specimen Description BLOOD LEFT ANTECUBITAL  Final   Special Requests   Final    BOTTLES DRAWN AEROBIC AND ANAEROBIC Blood Culture adequate volume    Culture   Final    NO GROWTH 3 DAYS Performed at [facility], [address], [place], [place] [address]    Report Status PENDING  Incomplete         Impression:   [name] is a 73 year old Caucasian female with a past medical history of  diverticulitis status post colectomy and ileostomy,CHF, hypertension, stroke, A. fib, GERD, hypothyroidism, seizure, IBS, hyponatremia, anemia, chronic respiratory failure with hypoxia on 2 L of oxygen by nasal cannula at night,, was admitted to [facility] on 11/17/2021with c/oshortness of breathand chest tightness, started when hernasal cannula was off of her nose during the night. Nephrology was consulted for the management of hyponatremia  1) hyponatremia Patient most likely had intravascular volume depletion Patient had  hypovolemic hyponatremia Data in favor of hypovolemia as A-patient urine sodium is less than 10 B-patient urine osmolality was high C- no data of IV contrast or mannitol/other risk factors causing high urine osmolality D-patient did respond to IV fluids and sodium is trending up  Data against hypovolemia as the patient does have pleural effusion   I asked for urine sodium and osmolality Results for [name], [name] (MRN [id number]) as of 04/21/2020 15:11  Ref. Range 04/18/2020 05:40 04/21/2020 10:07  Osmolality, Urine Latest Ref Range: 300 - 900 mOsm/kg 537 449  Sodium, Urine Latest Units: mmol/L <10 34  . Patient urine sodium is now higher than before We will continue the current treatment plan    2)HTN    Blood pressure is stable    3)Anemia of chronic disease  CBC Latest Ref Rng & Units 04/21/2020 04/20/2020 04/19/2020  WBC 4.0 - 10.5 K/uL 14.8(H) 12.0(H) 14.4(H)  Hemoglobin 12.0 - 15.0 g/dL 11.1(L) 11.1(L) 11.4(L)  Hematocrit 36 - 46 % 35.5(L) 35.6(L) 36.0  Platelets 150 - 400 K/uL 306 336 371       HGb at goal (9--11)   4) Secondary hyperparathyroidism -CKD Mineral-Bone  Disorder    Lab Results  Component Value Date   CALCIUM 8.7 (L) 04/21/2020   PHOS 2.8 04/19/2020    Secondary Hyperparathyroidism absent.  Phosphorus at goal.   5) diastolic CHF Patient is stable  6) hypothyroidism Patient is on levothyroxine   7)Acid base  Co2 at goal  8) pleural effusion Patient chest x-ray done on November 17 had shown small to moderate pleural effusion Patient may need tap depending upon the size of pleural effusion now   Plan:  No need for IV fluids We will continue the current treatment plan     Shyonna Carlin s Costella Schwarz 04/21/2020, 3:11 PM

## 2020-04-22 DIAGNOSIS — I959 Hypotension, unspecified: Secondary | ICD-10-CM

## 2020-04-22 DIAGNOSIS — J90 Pleural effusion, not elsewhere classified: Secondary | ICD-10-CM

## 2020-04-22 DIAGNOSIS — J9601 Acute respiratory failure with hypoxia: Secondary | ICD-10-CM

## 2020-04-22 LAB — COMPREHENSIVE METABOLIC PANEL
ALT: 43 U/L (ref 0–44)
AST: 39 U/L (ref 15–41)
Albumin: 2.9 g/dL — ABNORMAL LOW (ref 3.5–5.0)
Alkaline Phosphatase: 97 U/L (ref 38–126)
Anion gap: 10 (ref 5–15)
BUN: 19 mg/dL (ref 8–23)
CO2: 21 mmol/L — ABNORMAL LOW (ref 22–32)
Calcium: 8.7 mg/dL — ABNORMAL LOW (ref 8.9–10.3)
Chloride: 96 mmol/L — ABNORMAL LOW (ref 98–111)
Creatinine, Ser: 0.74 mg/dL (ref 0.44–1.00)
GFR, Estimated: >60 mL/min (ref >60)
Glucose, Bld: 105 mg/dL — ABNORMAL HIGH (ref 70–99)
Potassium: 4.8 mmol/L (ref 3.5–5.1)
Sodium: 127 mmol/L — ABNORMAL LOW (ref 135–145)
Total Bilirubin: 0.9 mg/dL (ref 0.3–1.2)
Total Protein: 6 g/dL — ABNORMAL LOW (ref 6.5–8.1)

## 2020-04-22 LAB — CBC WITH DIFFERENTIAL/PLATELET
Abs Immature Granulocytes: 0.31 10*3/uL — ABNORMAL HIGH (ref 0.00–0.07)
Basophils Absolute: 0.1 10*3/uL (ref 0.0–0.1)
Basophils Relative: 0 %
Eosinophils Absolute: 0.1 10*3/uL (ref 0.0–0.5)
Eosinophils Relative: 0 %
HCT: 34.5 % — ABNORMAL LOW (ref 36.0–46.0)
Hemoglobin: 10.8 g/dL — ABNORMAL LOW (ref 12.0–15.0)
Immature Granulocytes: 2 %
Lymphocytes Relative: 10 %
Lymphs Abs: 1.6 10*3/uL (ref 0.7–4.0)
MCH: 25.1 pg — ABNORMAL LOW (ref 26.0–34.0)
MCHC: 31.3 g/dL (ref 30.0–36.0)
MCV: 80.2 fL (ref 80.0–100.0)
Monocytes Absolute: 0.7 10*3/uL (ref 0.1–1.0)
Monocytes Relative: 4 %
Neutro Abs: 13 10*3/uL — ABNORMAL HIGH (ref 1.7–7.7)
Neutrophils Relative %: 84 %
Platelets: 274 10*3/uL (ref 150–400)
RBC: 4.3 MIL/uL (ref 3.87–5.11)
RDW: 16.2 % — ABNORMAL HIGH (ref 11.5–15.5)
WBC: 15.6 10*3/uL — ABNORMAL HIGH (ref 4.0–10.5)
nRBC: 0.2 % (ref 0.0–0.2)

## 2020-04-22 NOTE — Progress Notes (Signed)
[name]  MRN: [id number]  DOB/AGE: December 03, 1945 74 y.o.  Primary Care Physician:Wroth, Sydnee Cabal, MD  Admit date: 04/18/2020  Chief Complaint:  Chief Complaint  Patient presents with  . Shortness of Breath    S-Pt presented on  04/18/2020 with  Chief Complaint  Patient presents with  . Shortness of Breath  . Patient offers no new physical complaints in today's visit. Patient says she is feeling better. Patient main concern continues to be if she is going to be discharged soon.  I then discussed patient sodium/chest x-ray results. I discussed patient for possible need for pleural tap. Patient was understanding    Medications . (feeding supplement) PROSource Plus  30 mL Oral BID BM  . amiodarone  200 mg Oral Daily  . apixaban  5 mg Oral BID  . folic acid  1 mg Oral Daily  . furosemide  20 mg Oral QODAY  . hydrocerin  1 application Topical BID  . levothyroxine  75 mcg Oral Q0600  . metoprolol succinate  50 mg Oral BID AC  . midodrine  2.5 mg Oral TID WC  . multivitamin with minerals  1 tablet Oral Daily  . pantoprazole  40 mg Oral Daily  . sodium chloride flush  3 mL Intravenous Q12H  . sodium chloride  1 g Oral BID WC         XAJ:OINOM from the symptoms mentioned above,there are no other symptoms referable to all systems reviewed.  Physical Exam: Vital signs in last 24 hours: Temp:  [97.6 F (36.4 C)-98.1 F (36.7 C)] 97.6 F (36.4 C) (11/21 0501) Pulse Rate:  [100-119] 100 (11/21 0501) Resp:  [17-20] 20 (11/21 0501) BP: (98-126)/(79-91) 98/86 (11/21 0501) SpO2:  [97 %-100 %] 100 % (11/21 0501) Weight:  [66.2 kg] 66.2 kg (11/21 0501) Weight change: -2.375 kg Last BM Date: 04/21/20  Intake/Output from previous day: 11/20 0701 - 11/21 0700 In: 840 [P.O.:840] Out: 1900 [Urine:1700; Stool:200] No intake/output data recorded.   Physical Exam: General- pt is awake,alert, oriented to time place and person  Resp- No acute REsp distress, decreased at  bases  CVS- S1S2 regular in rate and rhythm  GIT- BS+, soft, Non tender , Non distended  EXT- NO LE Edema, Cyanosis    Lab Results:  CBC  Recent Labs    04/21/20 0408 04/22/20 0407  WBC 14.8* 15.6*  HGB 11.1* 10.8*  HCT 35.5* 34.5*  PLT 306 274    BMET  Recent Labs    04/21/20 0408 04/22/20 0407  NA 128* 127*  K 4.1 4.8  CL 97* 96*  CO2 23 21*  GLUCOSE 108* 105*  BUN 13 19  CREATININE 0.64 0.74  CALCIUM 8.7* 8.7*      Most recent Creatinine trend  Lab Results  Component Value Date   CREATININE 0.74 04/22/2020   CREATININE 0.64 04/21/2020   CREATININE 0.67 04/20/2020      MICRO   Recent Results (from the past 240 hour(s))  Respiratory Panel by RT PCR (Flu A&B, Covid) - Nasopharyngeal Swab     Status: None   Collection Time: 04/13/20 11:46 AM   Specimen: Nasopharyngeal Swab  Result Value Ref Range Status   SARS Coronavirus 2 by RT PCR NEGATIVE NEGATIVE Final    Comment: (NOTE) SARS-CoV-2 target nucleic acids are NOT DETECTED.  The SARS-CoV-2 RNA is generally detectable in upper respiratoy specimens during the acute phase of infection. The lowest concentration of SARS-CoV-2 viral copies this assay can detect  is 131 copies/mL. A negative result does not preclude SARS-Cov-2 infection and should not be used as the sole basis for treatment or other patient management decisions. A negative result may occur with  improper specimen collection/handling, submission of specimen other than nasopharyngeal swab, presence of viral mutation(s) within the areas targeted by this assay, and inadequate number of viral copies (<131 copies/mL). A negative result must be combined with clinical observations, patient history, and epidemiological information. The expected result is Negative.  Fact Sheet for Patients:  https://www.moore.com/  Fact Sheet for Healthcare Providers:  https://www.young.biz/  This test is no t yet  approved or cleared by the [place] FDA and  has been authorized for detection and/or diagnosis of SARS-CoV-2 by FDA under an Emergency Use Authorization (EUA). This EUA will remain  in effect (meaning this test can be used) for the duration of the COVID-19 declaration under Section 564(b)(1) of the Act, 21 U.S.C. section 360bbb-3(b)(1), unless the authorization is terminated or revoked sooner.     Influenza A by PCR NEGATIVE NEGATIVE Final   Influenza B by PCR NEGATIVE NEGATIVE Final    Comment: (NOTE) The Xpert Xpress SARS-CoV-2/FLU/RSV assay is intended as an aid in  the diagnosis of influenza from Nasopharyngeal swab specimens and  should not be used as a sole basis for treatment. Nasal washings and  aspirates are unacceptable for Xpert Xpress SARS-CoV-2/FLU/RSV  testing.  Fact Sheet for Patients: https://www.moore.com/  Fact Sheet for Healthcare Providers: https://www.young.biz/  This test is not yet approved or cleared by the [place] FDA and  has been authorized for detection and/or diagnosis of SARS-CoV-2 by  FDA under an Emergency Use Authorization (EUA). This EUA will remain  in effect (meaning this test can be used) for the duration of the  Covid-19 declaration under Section 564(b)(1) of the Act, 21  U.S.C. section 360bbb-3(b)(1), unless the authorization is  terminated or revoked. Performed at [facility], [address] [place], [place] [address]   Resp Panel by RT PCR (RSV, Flu A&B, Covid) - Nasopharyngeal Swab     Status: None   Collection Time: 04/18/20  8:44 AM   Specimen: Nasopharyngeal Swab  Result Value Ref Range Status   SARS Coronavirus 2 by RT PCR NEGATIVE NEGATIVE Final    Comment: (NOTE) SARS-CoV-2 target nucleic acids are NOT DETECTED.  The SARS-CoV-2 RNA is generally detectable in upper respiratoy specimens during the acute phase of infection. The lowest concentration of SARS-CoV-2 viral  copies this assay can detect is 131 copies/mL. A negative result does not preclude SARS-Cov-2 infection and should not be used as the sole basis for treatment or other patient management decisions. A negative result may occur with  improper specimen collection/handling, submission of specimen other than nasopharyngeal swab, presence of viral mutation(s) within the areas targeted by this assay, and inadequate number of viral copies (<131 copies/mL). A negative result must be combined with clinical observations, patient history, and epidemiological information. The expected result is Negative.  Fact Sheet for Patients:  https://www.moore.com/  Fact Sheet for Healthcare Providers:  https://www.young.biz/  This test is no t yet approved or cleared by the [place] FDA and  has been authorized for detection and/or diagnosis of SARS-CoV-2 by FDA under an Emergency Use Authorization (EUA). This EUA will remain  in effect (meaning this test can be used) for the duration of the COVID-19 declaration under Section 564(b)(1) of the Act, 21 U.S.C. section 360bbb-3(b)(1), unless the authorization is terminated or revoked sooner.  Influenza A by PCR NEGATIVE NEGATIVE Final   Influenza B by PCR NEGATIVE NEGATIVE Final    Comment: (NOTE) The Xpert Xpress SARS-CoV-2/FLU/RSV assay is intended as an aid in  the diagnosis of influenza from Nasopharyngeal swab specimens and  should not be used as a sole basis for treatment. Nasal washings and  aspirates are unacceptable for Xpert Xpress SARS-CoV-2/FLU/RSV  testing.  Fact Sheet for Patients: https://www.moore.com/https://www.fda.gov/media/142436/download  Fact Sheet for Healthcare Providers: https://www.young.biz/https://www.fda.gov/media/142435/download  This test is not yet approved or cleared by the [place] FDA and  has been authorized for detection and/or diagnosis of SARS-CoV-2 by  FDA under an Emergency Use Authorization (EUA). This  EUA will remain  in effect (meaning this test can be used) for the duration of the  Covid-19 declaration under Section 564(b)(1) of the Act, 21  U.S.C. section 360bbb-3(b)(1), unless the authorization is  terminated or revoked.    Respiratory Syncytial Virus by PCR NEGATIVE NEGATIVE Final    Comment: (NOTE) Fact Sheet for Patients: https://www.moore.com/https://www.fda.gov/media/142436/download  Fact Sheet for Healthcare Providers: https://www.young.biz/https://www.fda.gov/media/142435/download  This test is not yet approved or cleared by the [place] FDA and  has been authorized for detection and/or diagnosis of SARS-CoV-2 by  FDA under an Emergency Use Authorization (EUA). This EUA will remain  in effect (meaning this test can be used) for the duration of the  COVID-19 declaration under Section 564(b)(1) of the Act, 21 U.S.C.  section 360bbb-3(b)(1), unless the authorization is terminated or  revoked. Performed at [facility], [address] [place], [place] [address]   Culture, blood (Routine X 2) w Reflex to ID Panel     Status: Abnormal (Preliminary result)   Collection Time: 04/18/20 10:26 AM   Specimen: BLOOD LEFT HAND  Result Value Ref Range Status   Specimen Description BLOOD LEFT HAND  Final   Special Requests (A)  Final    AEROCOCCUS SPECIES Blood Culture results may not be optimal due to an inadequate volume of blood received in culture bottles   Culture   Final    NO GROWTH 4 DAYS Performed at [facility], [address] [place], [place] [address]    Report Status PENDING  Incomplete  Culture, blood (Routine X 2) w Reflex to ID Panel     Status: None (Preliminary result)   Collection Time: 04/18/20 10:34 AM   Specimen: BLOOD  Result Value Ref Range Status   Specimen Description BLOOD LEFT ANTECUBITAL  Final   Special Requests   Final    BOTTLES DRAWN AEROBIC AND ANAEROBIC Blood Culture adequate volume   Culture   Final    NO GROWTH 4 DAYS Performed at [facility],  [address] [place], [place] [address]    Report Status PENDING  Incomplete         Impression:   Ms. Orlin HildingMaxine B Ouch is a 74 year old Caucasian female with a past medical history of  diverticulitis status post colectomy and ileostomy,CHF, hypertension, stroke, A. fib, GERD, hypothyroidism, seizure, IBS, hyponatremia, anemia, chronic respiratory failure with hypoxia on 2 L of oxygen by nasal cannula at night,, [facility]21with c/oshortness of breathand chest tightness, started when hernasal cannula was off of her nose during the night. Nephrology was consulted for the management of hyponatremia  1) hyponatremia Patient most likely had intravascular volume depletion Patient had hypovolemic hyponatremia Data in favor of hypovolemia as A-patient urine sodium is less than 10 B-patient urine osmolality was high C- no  data of IV contrast or mannitol/other risk factors causing high urine osmolality D-patient did respond to IV fluids and sodium is trending up  Data against hypovolemia as the patient does have pleural effusion   I asked for urine sodium and osmolality Results for ABBEE, CREMEENS (MRN [id number]) as of 04/21/2020 15:11  Ref. Range 04/18/2020 05:40 04/21/2020 10:07  Osmolality, Urine Latest Ref Range: 300 - 900 mOsm/kg 537 449  Sodium, Urine Latest Units: mmol/L <10 34  . Patient urine sodium is now higher than before We will continue the current treatment plan    2)HTN    Blood pressure is stable    3)Anemia of chronic disease  CBC Latest Ref Rng & Units 04/22/2020 04/21/2020 04/20/2020  WBC 4.0 - 10.5 K/uL 15.6(H) 14.8(H) 12.0(H)  Hemoglobin 12.0 - 15.0 g/dL 10.8(L) 11.1(L) 11.1(L)  Hematocrit 36 - 46 % 34.5(L) 35.5(L) 35.6(L)  Platelets 150 - 400 K/uL 274 306 336       HGb at goal (9--11)   4) Secondary hyperparathyroidism -CKD Mineral-Bone Disorder    Lab Results  Component Value Date   CALCIUM 8.7 (L)  04/22/2020   PHOS 2.8 04/19/2020    Secondary Hyperparathyroidism absent.  Phosphorus at goal.   5) diastolic CHF Patient is stable  6) hypothyroidism Patient is on levothyroxine   7)Acid base  Co2 at goal  8) pleural effusion Patient has moderated bilateral pleural effusion Patient may benefit from tap   Plan:  Sodium is stable We will continue the current treatment plan     Zakayla Martinec s [facility] 04/22/2020, 7:19 AM

## 2020-04-22 NOTE — Progress Notes (Addendum)
Patient ID: [name], female   DOB: 07/09/1945, 74 y.o.   MRN: [id number] [facility] PROGRESS NOTE  JAYLNN ULLERY NWG:[id number] DOB: March 21, 1977 DOA: 04/18/2020 PCP: Mickel Fuchs, MD  HPI/Subjective: Patient came in with hyponatremia.  Had some abdominal pain and some shortness of breath.  Found to have bilateral pleural effusions.  Objective: Vitals:   04/22/20 1240 04/22/20 1250  BP: (!) 127/99 121/86  Pulse: (!) 105 (!) 103  Resp: 20   Temp: 97.6 F (36.4 C)   SpO2: 100% 100%    Intake/Output Summary (Last 24 hours) at 04/22/2020 1537 Last data filed at 04/22/2020 0502 Gross per 24 hour  Intake 240 ml  Output 500 ml  Net -260 ml   Filed Weights   04/20/20 0306 04/21/20 0500 04/22/20 0501  Weight: 66 kg 68.6 kg 66.2 kg    ROS: Review of Systems  Respiratory: Positive for shortness of breath. Negative for cough.   Cardiovascular: Negative for chest pain.  Gastrointestinal: Positive for abdominal pain. Negative for nausea and vomiting.   Exam: Physical Exam HENT:     Head: Normocephalic.     Mouth/Throat:     Pharynx: No oropharyngeal exudate.  Eyes:     General: Lids are normal.     Conjunctiva/sclera: Conjunctivae normal.     Pupils: Pupils are equal, round, and reactive to light.  Cardiovascular:     Rate and Rhythm: Normal rate. Rhythm irregularly irregular.     Heart sounds: Normal heart sounds, S1 normal and S2 normal.  Pulmonary:     Breath sounds: Examination of the right-lower field reveals decreased breath sounds and rhonchi. Examination of the left-lower field reveals decreased breath sounds and rhonchi. Decreased breath sounds and rhonchi present. No wheezing or rales.  Abdominal:     Palpations: Abdomen is soft.     Tenderness: There is no abdominal tenderness.  Musculoskeletal:     Right knee: No swelling.     Left knee: No swelling.  Skin:    General: Skin is warm.     Findings: No rash.  Neurological:     Mental Status: She  is alert and oriented to person, place, and time.       Data Reviewed: Basic Metabolic Panel: Recent Labs  Lab 04/18/20 2351 04/19/20 0909 04/20/20 0405 04/21/20 0408 04/22/20 0407  NA 117* 123* 127* 128* 127*  K 3.5 3.6 4.3 4.1 4.8  CL 86* 89* 97* 97* 96*  CO2 21* 24 20* 23 21*  GLUCOSE 116* 110* 96 108* 105*  BUN 13 12 12 13 19   CREATININE 0.72 0.73 0.67 0.64 0.74  CALCIUM 7.8* 8.4* 8.3* 8.7* 8.7*  MG 1.9  --   --   --   --   PHOS  --  2.8  --   --   --    Liver Function Tests: Recent Labs  Lab 04/18/20 0540 04/19/20 0909 04/20/20 0405 04/21/20 0408 04/22/20 0407  AST 67*  --  32 31 39  ALT 65*  --  48* 44 43  ALKPHOS 104  --  103 107 97  BILITOT 1.1  --  0.5 0.5 0.9  PROT 6.8  --  6.1* 6.5 6.0*  ALBUMIN 3.2* 3.1* 2.8* 3.1* 2.9*   CBC: Recent Labs  Lab 04/18/20 0540 04/18/20 0540 04/18/20 2351 04/19/20 0918 04/20/20 0405 04/21/20 0408 04/22/20 0407  WBC 16.0*   < > 14.1* 14.4* 12.0* 14.8* 15.6*  NEUTROABS 11.8*  --   --  11.5* 9.4* 11.5* 13.0*  HGB 11.5*   < > 10.4* 11.4* 11.1* 11.1* 10.8*  HCT 35.6*   < > 31.6* 36.0 35.6* 35.5* 34.5*  MCV 78.9*   < > 76.9* 80.0 80.7 80.0 80.2  PLT 406*   < > 357 371 336 306 274   < > = values in this interval not displayed.   BNP (last 3 results) Recent Labs    03/13/20 1718 04/08/20 2350 04/18/20 0540  BNP 419.8* 1,381.6* 884.9*      Recent Results (from the past 240 hour(s))  Respiratory Panel by RT PCR (Flu A&B, Covid) - Nasopharyngeal Swab     Status: None   Collection Time: 04/13/20 11:46 AM   Specimen: Nasopharyngeal Swab  Result Value Ref Range Status   SARS Coronavirus 2 by RT PCR NEGATIVE NEGATIVE Final    Comment: (NOTE) SARS-CoV-2 target nucleic acids are NOT DETECTED.  The SARS-CoV-2 RNA is generally detectable in upper respiratoy specimens during the acute phase of infection. The lowest concentration of SARS-CoV-2 viral copies this assay can detect is 131 copies/mL. A negative result  does not preclude SARS-Cov-2 infection and should not be used as the sole basis for treatment or other patient management decisions. A negative result may occur with  improper specimen collection/handling, submission of specimen other than nasopharyngeal swab, presence of viral mutation(s) within the areas targeted by this assay, and inadequate number of viral copies (<131 copies/mL). A negative result must be combined with clinical observations, patient history, and epidemiological information. The expected result is Negative.  Fact Sheet for Patients:  https://www.moore.com/  Fact Sheet for Healthcare Providers:  https://www.young.biz/  This test is no t yet approved or cleared by the [place] FDA and  has been authorized for detection and/or diagnosis of SARS-CoV-2 by FDA under an Emergency Use Authorization (EUA). This EUA will remain  in effect (meaning this test can be used) for the duration of the COVID-19 declaration under Section 564(b)(1) of the Act, 21 U.S.C. section 360bbb-3(b)(1), unless the authorization is terminated or revoked sooner.     Influenza A by PCR NEGATIVE NEGATIVE Final   Influenza B by PCR NEGATIVE NEGATIVE Final    Comment: (NOTE) The Xpert Xpress SARS-CoV-2/FLU/RSV assay is intended as an aid in  the diagnosis of influenza from Nasopharyngeal swab specimens and  should not be used as a sole basis for treatment. Nasal washings and  aspirates are unacceptable for Xpert Xpress SARS-CoV-2/FLU/RSV  testing.  Fact Sheet for Patients: https://www.moore.com/  Fact Sheet for Healthcare Providers: https://www.young.biz/  This test is not yet approved or cleared by the [place] FDA and  has been authorized for detection and/or diagnosis of SARS-CoV-2 by  FDA under an Emergency Use Authorization (EUA). This EUA will remain  in effect (meaning this test can be used) for  the duration of the  Covid-19 declaration under Section 564(b)(1) of the Act, 21  U.S.C. section 360bbb-3(b)(1), unless the authorization is  terminated or revoked. Performed at [facility], [address] [place], [place] [address]   Resp Panel by RT PCR (RSV, Flu A&B, Covid) - Nasopharyngeal Swab     Status: None   Collection Time: 04/18/20  8:44 AM   Specimen: Nasopharyngeal Swab  Result Value Ref Range Status   SARS Coronavirus 2 by RT PCR NEGATIVE NEGATIVE Final    Comment: (NOTE) SARS-CoV-2 target nucleic acids are NOT DETECTED.  The SARS-CoV-2 RNA is generally detectable in upper respiratoy specimens during the acute  phase of infection. The lowest concentration of SARS-CoV-2 viral copies this assay can detect is 131 copies/mL. A negative result does not preclude SARS-Cov-2 infection and should not be used as the sole basis for treatment or other patient management decisions. A negative result may occur with  improper specimen collection/handling, submission of specimen other than nasopharyngeal swab, presence of viral mutation(s) within the areas targeted by this assay, and inadequate number of viral copies (<131 copies/mL). A negative result must be combined with clinical observations, patient history, and epidemiological information. The expected result is Negative.  Fact Sheet for Patients:  https://www.moore.com/  Fact Sheet for Healthcare Providers:  https://www.young.biz/  This test is no t yet approved or cleared by the [place] FDA and  has been authorized for detection and/or diagnosis of SARS-CoV-2 by FDA under an Emergency Use Authorization (EUA). This EUA will remain  in effect (meaning this test can be used) for the duration of the COVID-19 declaration under Section 564(b)(1) of the Act, 21 U.S.C. section 360bbb-3(b)(1), unless the authorization is terminated or revoked sooner.     Influenza A by PCR  NEGATIVE NEGATIVE Final   Influenza B by PCR NEGATIVE NEGATIVE Final    Comment: (NOTE) The Xpert Xpress SARS-CoV-2/FLU/RSV assay is intended as an aid in  the diagnosis of influenza from Nasopharyngeal swab specimens and  should not be used as a sole basis for treatment. Nasal washings and  aspirates are unacceptable for Xpert Xpress SARS-CoV-2/FLU/RSV  testing.  Fact Sheet for Patients: https://www.moore.com/  Fact Sheet for Healthcare Providers: https://www.young.biz/  This test is not yet approved or cleared by the [place] FDA and  has been authorized for detection and/or diagnosis of SARS-CoV-2 by  FDA under an Emergency Use Authorization (EUA). This EUA will remain  in effect (meaning this test can be used) for the duration of the  Covid-19 declaration under Section 564(b)(1) of the Act, 21  U.S.C. section 360bbb-3(b)(1), unless the authorization is  terminated or revoked.    Respiratory Syncytial Virus by PCR NEGATIVE NEGATIVE Final    Comment: (NOTE) Fact Sheet for Patients: https://www.moore.com/  Fact Sheet for Healthcare Providers: https://www.young.biz/  This test is not yet approved or cleared by the [place] FDA and  has been authorized for detection and/or diagnosis of SARS-CoV-2 by  FDA under an Emergency Use Authorization (EUA). This EUA will remain  in effect (meaning this test can be used) for the duration of the  COVID-19 declaration under Section 564(b)(1) of the Act, 21 U.S.C.  section 360bbb-3(b)(1), unless the authorization is terminated or  revoked. Performed at [facility], [address] [place], [place] [address]   Culture, blood (Routine X 2) w Reflex to ID Panel     Status: Abnormal (Preliminary result)   Collection Time: 04/18/20 10:26 AM   Specimen: BLOOD LEFT HAND  Result Value Ref Range Status   Specimen Description BLOOD LEFT HAND  Final    Special Requests (A)  Final    AEROCOCCUS SPECIES Blood Culture results may not be optimal due to an inadequate volume of blood received in culture bottles   Culture   Final    NO GROWTH 4 DAYS Performed at [facility], [address], [place], [place] [address]    Report Status PENDING  Incomplete  Culture, blood (Routine X 2) w Reflex to ID Panel     Status: None (Preliminary result)   Collection Time: 04/18/20 10:34 AM   Specimen: BLOOD  Result Value  Ref Range Status   Specimen Description BLOOD LEFT ANTECUBITAL  Final   Special Requests   Final    BOTTLES DRAWN AEROBIC AND ANAEROBIC Blood Culture adequate volume   Culture   Final    NO GROWTH 4 DAYS Performed at [facility], [address] [place], [place] [address]    Report Status PENDING  Incomplete     Studies: DG Chest 2 View  Result Date: 04/21/2020 CLINICAL DATA:  Shortness of breath, pleural effusion EXAM: CHEST - 2 VIEW COMPARISON:  04/18/2020 FINDINGS: No significant interval change in chest radiographs with moderate, layering bilateral pleural effusions and diffuse interstitial pulmonary opacity. Cardiomegaly. IMPRESSION: No significant interval change in chest radiographs with moderate, layering bilateral pleural effusions and diffuse interstitial pulmonary opacity, likely edema. Electronically Signed   By: Lauralyn PrimesAlex  Bibbey M.D.   On: 04/21/2020 15:31    Scheduled Meds: . (feeding supplement) PROSource Plus  30 mL Oral BID BM  . amiodarone  200 mg Oral Daily  . apixaban  5 mg Oral BID  . folic acid  1 mg Oral Daily  . furosemide  20 mg Oral QODAY  . hydrocerin  1 application Topical BID  . levothyroxine  75 mcg Oral Q0600  . metoprolol succinate  50 mg Oral BID AC  . midodrine  2.5 mg Oral TID WC  . multivitamin with minerals  1 tablet Oral Daily  . pantoprazole  40 mg Oral Daily  . sodium chloride flush  3 mL Intravenous Q12H  . sodium chloride  1 g Oral BID WC   Continuous Infusions: .  sodium chloride      Assessment/Plan:  1. Hyponatremia.  Patient does have bilateral pleural effusions.  Case discussed with nephrology and we will do a thoracentesis tomorrow.  Patient on salt tablets.  IV fluids discontinued on the 19th.  Low-dose Lasix. 2. Bilateral pleural effusions.  Set up for thoracentesis for tomorrow.  May end up needing to have the other side done prior to disposition. 3. Acute hypoxic respiratory failure.  Usually wears oxygen at night but now likely will need it 24/7. 4. Acute on chronic diastolic congestive heart failure.  Continue low-dose Lasix.  We will get a thoracentesis tomorrow. 5. Chronic atrial fibrillation on Eliquis for anticoagulation amiodarone, metoprolol for rate control. 6. Chronic hypotension on low-dose midodrine 7. Hypothyroidism unspecified on Synthroid. 8. 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 Recent subtotal colectomy and ileostomy.  Previous E. coli, enterococcal infection status post antibiotics 10. Iron deficiency anemia     Code Status:     Code Status Orders  (From admission, onward)         Start     Ordered   04/18/20 1033  Full code  Continuous        04/18/20 1032        Code Status History    Date Active Date Inactive Code Status Order ID Comments User Context   04/18/2020 0920 04/18/2020 1032 Full Code [id number]  Lorretta HarpNiu, Xilin, MD ED   04/18/2020 0915 04/18/2020 0920 DNR [id number]  Lorretta HarpNiu, Xilin, MD ED   04/18/2020 0908 04/18/2020 0915 DNR [id number]  Lorretta HarpNiu, Xilin, MD ED   04/09/2020 0203 04/13/2020 2042 Full Code [id number]  Mansy, Vernetta HoneyJan A, MD ED   03/29/2020 0404 04/03/2020 1844 Full Code [id number]  Andris Baumannuncan, Hazel V, MD ED   03/11/2020 2235 03/21/2020 0011 DNR [id number]  Anselm Junglingu, Ching T, DO ED   12/04/2019 1919 12/07/2019 1936 Full Code [id number]  Clyde LundborgNiu,  Brien Few, MD Inpatient   03/17/2019 2019 03/20/2019 1956 Full Code [id number]  Campbell Stall, MD Inpatient   07/05/2017 0148 07/07/2017 1630 Full Code [id number]  Ihor Austin, MD Inpatient    Advance Care Planning Activity    Advance Directive Documentation     Most Recent Value  Type of Advance Directive Healthcare Power of Attorney  Pre-existing out of facility DNR order (yellow form or pink MOST form) --  "MOST" Form in Place? --     Family Communication: Spoke with son on the phone Disposition Plan: Status is: Inpatient  Dispo: The patient is from: Rehab              Anticipated d/c is to: Rehab              Anticipated d/c date is: Potential 04/24/2020 in the afternoon versus 04/25/2020 in the morning              Patient currently will have a thoracentesis tomorrow  Time spent: 28 minutes  [employer]

## 2020-04-23 ENCOUNTER — Encounter: Payer: Medicare Other | Admitting: Physician Assistant

## 2020-04-23 ENCOUNTER — Inpatient Hospital Stay: Payer: Medicare Other

## 2020-04-23 DIAGNOSIS — J9621 Acute and chronic respiratory failure with hypoxia: Secondary | ICD-10-CM

## 2020-04-23 LAB — BODY FLUID CELL COUNT WITH DIFFERENTIAL
Eos, Fluid: 0 %
Lymphs, Fluid: 45 %
Monocyte-Macrophage-Serous Fluid: 13 %
Neutrophil Count, Fluid: 42 %
Total Nucleated Cell Count, Fluid: 471 cu mm

## 2020-04-23 LAB — PROTEIN, PLEURAL OR PERITONEAL FLUID: Total protein, fluid: <3 g/dL

## 2020-04-23 LAB — CULTURE, BLOOD (ROUTINE X 2)
Culture: NO GROWTH
Culture: NO GROWTH
Special Requests: ADEQUATE

## 2020-04-23 LAB — LACTATE DEHYDROGENASE, PLEURAL OR PERITONEAL FLUID: LD, Fluid: 57 U/L — ABNORMAL HIGH (ref 3–23)

## 2020-04-23 NOTE — Progress Notes (Signed)
Patient ID: [name], female   DOB: [birth year], 74 y.o.   MRN: [id number] [facility] PROGRESS NOTE  [name] [id number] DOB: April 26, 1946 DOA: 04/18/2020 PCP: Mickel Fuchs, MD  HPI/Subjective: Patient seen this morning prior to thoracentesis.  She was feeling okay.  I told her she can eat prior to the procedure and does not need to be n.p.o.  Came in initially with hyponatremia.  Objective: Vitals:   04/23/20 1117 04/23/20 1139  BP: 96/80 104/74  Pulse: 100 (!) 110  Resp:    Temp:    SpO2: 100% 100%    Intake/Output Summary (Last 24 hours) at 04/23/2020 1425 Last data filed at 04/23/2020 0942 Gross per 24 hour  Intake 3 ml  Output 500 ml  Net -497 ml   Filed Weights   04/22/20 0501 04/23/20 0500 04/23/20 0934  Weight: 66.2 kg 70.2 kg 67.7 kg    ROS: Review of Systems  Respiratory: Negative for shortness of breath.   Cardiovascular: Negative for chest pain.  Gastrointestinal: Negative for abdominal pain, nausea and vomiting.   Exam: Physical Exam Eyes:     General: Lids are normal.     Conjunctiva/sclera: Conjunctivae normal.     Pupils: Pupils are equal, round, and reactive to light.  Cardiovascular:     Rate and Rhythm: Normal rate. Rhythm irregularly irregular.     Heart sounds: Normal heart sounds, S1 normal and S2 normal.  Pulmonary:     Breath sounds: Examination of the right-lower field reveals decreased breath sounds and rhonchi. Examination of the left-lower field reveals decreased breath sounds and rhonchi. Decreased breath sounds and rhonchi present. No wheezing or rales.  Abdominal:     Palpations: Abdomen is soft.     Tenderness: There is no abdominal tenderness.  Musculoskeletal:     Right lower leg: No swelling.     Left lower leg: No swelling.  Skin:    General: Skin is warm.     Findings: No rash.  Neurological:     Mental Status: She is alert and oriented to person, place, and time.       Data Reviewed: Basic  Metabolic Panel: Recent Labs  Lab 04/18/20 2351 04/19/20 0909 04/20/20 0405 04/21/20 0408 04/22/20 0407  NA 117* 123* 127* 128* 127*  K 3.5 3.6 4.3 4.1 4.8  CL 86* 89* 97* 97* 96*  CO2 21* 24 20* 23 21*  GLUCOSE 116* 110* 96 108* 105*  BUN 13 12 12 13 19   CREATININE 0.72 0.73 0.67 0.64 0.74  CALCIUM 7.8* 8.4* 8.3* 8.7* 8.7*  MG 1.9  --   --   --   --   PHOS  --  2.8  --   --   --    Liver Function Tests: Recent Labs  Lab 04/18/20 0540 04/19/20 0909 04/20/20 0405 04/21/20 0408 04/22/20 0407  AST 67*  --  32 31 39  ALT 65*  --  48* 44 43  ALKPHOS 104  --  103 107 97  BILITOT 1.1  --  0.5 0.5 0.9  PROT 6.8  --  6.1* 6.5 6.0*  ALBUMIN 3.2* 3.1* 2.8* 3.1* 2.9*   CBC: Recent Labs  Lab 04/18/20 0540 04/18/20 0540 04/18/20 2351 04/19/20 0918 04/20/20 0405 04/21/20 0408 04/22/20 0407  WBC 16.0*   < > 14.1* 14.4* 12.0* 14.8* 15.6*  NEUTROABS 11.8*  --   --  11.5* 9.4* 11.5* 13.0*  HGB 11.5*   < > 10.4* 11.4* 11.1*  11.1* 10.8*  HCT 35.6*   < > 31.6* 36.0 35.6* 35.5* 34.5*  MCV 78.9*   < > 76.9* 80.0 80.7 80.0 80.2  PLT 406*   < > 357 371 336 306 274   < > = values in this interval not displayed.   BNP (last 3 results) Recent Labs    03/13/20 1718 04/08/20 2350 04/18/20 0540  BNP 419.8* 1,381.6* 884.9*    Recent Results (from the past 240 hour(s))  Resp Panel by RT PCR (RSV, Flu A&B, Covid) - Nasopharyngeal Swab     Status: None   Collection Time: 04/18/20  8:44 AM   Specimen: Nasopharyngeal Swab  Result Value Ref Range Status   SARS Coronavirus 2 by RT PCR NEGATIVE NEGATIVE Final    Comment: (NOTE) SARS-CoV-2 target nucleic acids are NOT DETECTED.  The SARS-CoV-2 RNA is generally detectable in upper respiratoy specimens during the acute phase of infection. The lowest concentration of SARS-CoV-2 viral copies this assay can detect is 131 copies/mL. A negative result does not preclude SARS-Cov-2 infection and should not be used as the sole basis for  treatment or other patient management decisions. A negative result may occur with  improper specimen collection/handling, submission of specimen other than nasopharyngeal swab, presence of viral mutation(s) within the areas targeted by this assay, and inadequate number of viral copies (<131 copies/mL). A negative result must be combined with clinical observations, patient history, and epidemiological information. The expected result is Negative.  Fact Sheet for Patients:  https://www.moore.com/https://www.fda.gov/media/142436/download  Fact Sheet for Healthcare Providers:  https://www.young.biz/https://www.fda.gov/media/142435/download  This test is no t yet approved or cleared by the [place] FDA and  has been authorized for detection and/or diagnosis of SARS-CoV-2 by FDA under an Emergency Use Authorization (EUA). This EUA will remain  in effect (meaning this test can be used) for the duration of the COVID-19 declaration under Section 564(b)(1) of the Act, 21 U.S.C. section 360bbb-3(b)(1), unless the authorization is terminated or revoked sooner.     Influenza A by PCR NEGATIVE NEGATIVE Final   Influenza B by PCR NEGATIVE NEGATIVE Final    Comment: (NOTE) The Xpert Xpress SARS-CoV-2/FLU/RSV assay is intended as an aid in  the diagnosis of influenza from Nasopharyngeal swab specimens and  should not be used as a sole basis for treatment. Nasal washings and  aspirates are unacceptable for Xpert Xpress SARS-CoV-2/FLU/RSV  testing.  Fact Sheet for Patients: https://www.moore.com/https://www.fda.gov/media/142436/download  Fact Sheet for Healthcare Providers: https://www.young.biz/https://www.fda.gov/media/142435/download  This test is not yet approved or cleared by the [place] FDA and  has been authorized for detection and/or diagnosis of SARS-CoV-2 by  FDA under an Emergency Use Authorization (EUA). This EUA will remain  in effect (meaning this test can be used) for the duration of the  Covid-19 declaration under Section 564(b)(1) of the Act, 21   U.S.C. section 360bbb-3(b)(1), unless the authorization is  terminated or revoked.    Respiratory Syncytial Virus by PCR NEGATIVE NEGATIVE Final    Comment: (NOTE) Fact Sheet for Patients: https://www.moore.com/https://www.fda.gov/media/142436/download  Fact Sheet for Healthcare Providers: https://www.young.biz/https://www.fda.gov/media/142435/download  This test is not yet approved or cleared by the [place] FDA and  has been authorized for detection and/or diagnosis of SARS-CoV-2 by  FDA under an Emergency Use Authorization (EUA). This EUA will remain  in effect (meaning this test can be used) for the duration of the  COVID-19 declaration under Section 564(b)(1) of the Act, 21 U.S.C.  section 360bbb-3(b)(1), unless the authorization is terminated or  revoked.  Performed at [facility], [address] [place], [place] [address]   Culture, blood (Routine X 2) w Reflex to ID Panel     Status: Abnormal   Collection Time: 04/18/20 10:26 AM   Specimen: BLOOD LEFT HAND  Result Value Ref Range Status   Specimen Description BLOOD LEFT HAND  Final   Special Requests (A)  Final    AEROCOCCUS SPECIES Blood Culture results may not be optimal due to an inadequate volume of blood received in culture bottles   Culture   Final    NO GROWTH 5 DAYS Performed at [facility], [address], [place], [place] [address]    Report Status 04/23/2020 FINAL  Final  Culture, blood (Routine X 2) w Reflex to ID Panel     Status: None   Collection Time: 04/18/20 10:34 AM   Specimen: BLOOD  Result Value Ref Range Status   Specimen Description BLOOD LEFT ANTECUBITAL  Final   Special Requests   Final    BOTTLES DRAWN AEROBIC AND ANAEROBIC Blood Culture adequate volume   Culture   Final    NO GROWTH 5 DAYS Performed at [facility], [address] [place], [place] [address]    Report Status 04/23/2020 FINAL  Final     Studies: DG Chest Port 1 View  Result Date: 04/23/2020 CLINICAL DATA:  Status post  thoracentesis EXAM: PORTABLE CHEST 1 VIEW COMPARISON:  April 21, 2020 FINDINGS: No pneumothorax. There are fairly small pleural effusions bilaterally with atelectasis and consolidation in the left lower lobe. Heart is mildly enlarged with pulmonary vascularity normal. No adenopathy. No bone lesions. IMPRESSION: No pneumothorax. Fairly small pleural effusions bilaterally with atelectasis and questionable superimposed pneumonia left lower lobe. Stable cardiac silhouette. Electronically Signed   By: Bretta Bang III M.D.   On: 04/23/2020 12:05   US THORACENTESIS ASP PLEURAL SPACE W/IMG GUIDE  Result Date: 04/23/2020 INDICATION: Patient with history chronic respiratory failure, AFib, CHF with shortness of breath. Found to have pleural effusion. Request is for therapeutic and diagnostic thoracentesis. EXAM: ULTRASOUND GUIDED THERAPEUTIC AND DIAGNOSTIC THORACENTESIS MEDICATIONS: Lidocaine 1% 10 mL COMPLICATIONS: None immediate. PROCEDURE: An ultrasound guided thoracentesis was thoroughly discussed with the patient and questions answered. The benefits, risks, alternatives and complications were also discussed. The patient understands and wishes to proceed with the procedure. Written consent was obtained. Ultrasound was performed to localize and mark an adequate pocket of fluid in the right chest. The area was then prepped and draped in the normal sterile fashion. 1% Lidocaine was used for local anesthesia. Under ultrasound guidance a 6 Fr Safe-T-Centesis catheter was introduced. Thoracentesis was performed. The catheter was removed and a dressing applied. FINDINGS: A total of approximately 1 L of straw-colored fluid was removed. Samples were sent to the laboratory as requested by the clinical team. IMPRESSION: Successful ultrasound guided therapeutic and diagnostic right sided thoracentesis yielding 1 L of pleural fluid. Read by: Anders Grant, NP Electronically Signed   By: Simonne Come M.D.   On:  04/23/2020 12:12    Scheduled Meds: . (feeding supplement) PROSource Plus  30 mL Oral BID BM  . amiodarone  200 mg Oral Daily  . apixaban  5 mg Oral BID  . folic acid  1 mg Oral Daily  . furosemide  20 mg Oral QODAY  . hydrocerin  1 application Topical BID  . levothyroxine  75 mcg Oral Q0600  . metoprolol succinate  50 mg Oral BID AC  . midodrine  2.5 mg Oral TID WC  . multivitamin with minerals  1 tablet Oral Daily  . pantoprazole  40 mg Oral Daily  . sodium chloride flush  3 mL Intravenous Q12H  . sodium chloride  1 g Oral BID WC   Continuous Infusions: . sodium chloride      Assessment/Plan:  1. Hyponatremia, hypervolemic.  Patient does have bilateral pleural effusions.  Patient on low-dose Lasix and salt tablets. 2. Bilateral pleural effusions.  Thoracentesis today drew off 1 L underneath the right lung.  Will look for fluid underneath the left lung tomorrow morning. 3. Acute on chronic hypoxic respiratory failure.  Usually wears oxygen at night but actually will now need 24/7. 4. Acute on chronic diastolic congestive heart failure continue low-dose Lasix. 5. Chronic atrial fibrillation on Eliquis for anticoagulation.  Amiodarone and metoprolol for rate control. 6. Chronic hypotension on midodrine 7. Hypothyroidism unspecified on Synthroid 8. 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 Iron deficiency anemia 10. Recent subtotal colectomy and ileostomy.  Previous E. coli and enterococcal infection from the wound.  Status post treatment with antibiotics.   Code Status:     Code Status Orders  (From admission, onward)         Start     Ordered   04/18/20 1033  Full code  Continuous        04/18/20 1032        Code Status History    Date Active Date Inactive Code Status Order ID Comments User Context   04/18/2020 0920 04/18/2020 1032 Full Code [id number]  Lorretta Harp, MD ED   04/18/2020 0915 04/18/2020 0920 DNR [id number]  Lorretta Harp, MD ED   04/18/2020 0908 04/18/2020 0915  DNR [id number]  Lorretta Harp, MD ED   04/09/2020 0203 04/13/2020 2042 Full Code [id number]  Mansy, Vernetta Honey, MD ED   03/29/2020 0404 04/03/2020 1844 Full Code [id number]  Andris Baumann, MD ED   03/11/2020 2235 03/21/2020 0011 DNR [id number]  Anselm Jungling, DO ED   12/04/2019 1919 12/07/2019 1936 Full Code [id number]  Lorretta Harp, MD Inpatient   03/17/2019 2019 03/20/2019 1956 Full Code [id number]  Mayo, Allyn Kenner, MD Inpatient   07/05/2017 0148 07/07/2017 1630 Full Code [id number]  Ihor Austin, MD Inpatient   Advance Care Planning Activity    Advance Directive Documentation     Most Recent Value  Type of Advance Directive Healthcare Power of Attorney  Pre-existing out of facility DNR order (yellow form or pink MOST form) --  "MOST" Form in Place? --     Family Communication: Spoke with son on the phone Disposition Plan: Status is: Inpatient  Dispo: The patient is from: Rehab              Anticipated d/c is to: Rehab              Anticipated d/c date is: Depending on the timing of tomorrow's thoracentesis may be able to go out to rehab on 04/24/2020 but more likely 04/25/2020              Patient currently had right thoracentesis today.  We will set up for left thoracentesis tomorrow.  Time spent: 27 minutes  Delainey Winstanley [employer]

## 2020-04-23 NOTE — Progress Notes (Addendum)
[facility]  ROUNDING NOTE   Subjective:   Patient resting in bed, dyspnea with exertion noted.She is on 2-3 L O2 via nasal cannula. Patient is for thoracentesis today. As she has bilateral pleural effusions.  Objective:  Vital signs in last 24 hours:  Temp:  [97.7 F (36.5 C)-97.9 F (36.6 C)] 97.7 F (36.5 C) (11/22 0813) Pulse Rate:  [100-110] 110 (11/22 1139) Resp:  [16-18] 16 (11/22 0813) BP: (96-118)/(74-94) 104/74 (11/22 1139) SpO2:  [99 %-100 %] 100 % (11/22 1139) Weight:  [67.7 kg-70.2 kg] 67.7 kg (11/22 0934)  Weight change: 3.975 kg Filed Weights   04/22/20 0501 04/23/20 0500 04/23/20 0934  Weight: 66.2 kg 70.2 kg 67.7 kg    Intake/Output: I/O last 3 completed shifts: In: 0  Out: 700 [Stool:700]   Intake/Output this shift:  Total I/O In: 3 [I.V.:3] Out: -   Physical Exam: General: Sitting up in bed, in no acute distress  Head: Normocephalic,atraumatic  Eyes: Anicteric  Lungs:  Lungs with rhonchi,diminished at the bases  Heart: Irregular rhythm  Abdomen:  Soft, nontender, slightly distended,colostomy bag in place  Extremities:  No  peripheral edema.  Neurologic: Oriented x 3  Skin: No acute lesions or rashes    Basic Metabolic Panel: Recent Labs  Lab 04/18/20 2351 04/18/20 2351 04/19/20 0909 04/19/20 0909 04/20/20 0405 04/21/20 0408 04/22/20 0407  NA 117*  --  123*  --  127* 128* 127*  K 3.5  --  3.6  --  4.3 4.1 4.8  CL 86*  --  89*  --  97* 97* 96*  CO2 21*  --  24  --  20* 23 21*  GLUCOSE 116*  --  110*  --  96 108* 105*  BUN 13  --  12  --  12 13 19   CREATININE 0.72  --  0.73  --  0.67 0.64 0.74  CALCIUM 7.8*   < > 8.4*   < > 8.3* 8.7* 8.7*  MG 1.9  --   --   --   --   --   --   PHOS  --   --  2.8  --   --   --   --    < > = values in this interval not displayed.    Liver Function Tests: Recent Labs  Lab 04/18/20 0540 04/19/20 0909 04/20/20 0405 04/21/20 0408 04/22/20 0407  AST 67*  --  32 31 39  ALT 65*  --   48* 44 43  ALKPHOS 104  --  103 107 97  BILITOT 1.1  --  0.5 0.5 0.9  PROT 6.8  --  6.1* 6.5 6.0*  ALBUMIN 3.2* 3.1* 2.8* 3.1* 2.9*   No results for input(s): LIPASE, AMYLASE in the last 168 hours. No results for input(s): AMMONIA in the last 168 hours.  CBC: Recent Labs  Lab 04/18/20 0540 04/18/20 0540 04/18/20 2351 04/19/20 0918 04/20/20 0405 04/21/20 0408 04/22/20 0407  WBC 16.0*   < > 14.1* 14.4* 12.0* 14.8* 15.6*  NEUTROABS 11.8*  --   --  11.5* 9.4* 11.5* 13.0*  HGB 11.5*   < > 10.4* 11.4* 11.1* 11.1* 10.8*  HCT 35.6*   < > 31.6* 36.0 35.6* 35.5* 34.5*  MCV 78.9*   < > 76.9* 80.0 80.7 80.0 80.2  PLT 406*   < > 357 371 336 306 274   < > = values in this interval not displayed.    Cardiac Enzymes: No  results for input(s): CKTOTAL, CKMB, CKMBINDEX, TROPONINI in the last 168 hours.  BNP: Invalid input(s): POCBNP  CBG: No results for input(s): GLUCAP in the last 168 hours.  Microbiology: Results for orders placed or performed during the hospital encounter of 04/18/20  Resp Panel by RT PCR (RSV, Flu A&B, Covid) - Nasopharyngeal Swab     Status: None   Collection Time: 04/18/20  8:44 AM   Specimen: Nasopharyngeal Swab  Result Value Ref Range Status   SARS Coronavirus 2 by RT PCR NEGATIVE NEGATIVE Final    Comment: (NOTE) SARS-CoV-2 target nucleic acids are NOT DETECTED.  The SARS-CoV-2 RNA is generally detectable in upper respiratoy specimens during the acute phase of infection. The lowest concentration of SARS-CoV-2 viral copies this assay can detect is 131 copies/mL. A negative result does not preclude SARS-Cov-2 infection and should not be used as the sole basis for treatment or other patient management decisions. A negative result may occur with  improper specimen collection/handling, submission of specimen other than nasopharyngeal swab, presence of viral mutation(s) within the areas targeted by this assay, and inadequate number of viral copies (<131  copies/mL). A negative result must be combined with clinical observations, patient history, and epidemiological information. The expected result is Negative.  Fact Sheet for Patients:  https://www.moore.com/  Fact Sheet for Healthcare Providers:  https://www.young.biz/  This test is no t yet approved or cleared by the [place] FDA and  has been authorized for detection and/or diagnosis of SARS-CoV-2 by FDA under an Emergency Use Authorization (EUA). This EUA will remain  in effect (meaning this test can be used) for the duration of the COVID-19 declaration under Section 564(b)(1) of the Act, 21 U.S.C. section 360bbb-3(b)(1), unless the authorization is terminated or revoked sooner.     Influenza A by PCR NEGATIVE NEGATIVE Final   Influenza B by PCR NEGATIVE NEGATIVE Final    Comment: (NOTE) The Xpert Xpress SARS-CoV-2/FLU/RSV assay is intended as an aid in  the diagnosis of influenza from Nasopharyngeal swab specimens and  should not be used as a sole basis for treatment. Nasal washings and  aspirates are unacceptable for Xpert Xpress SARS-CoV-2/FLU/RSV  testing.  Fact Sheet for Patients: https://www.moore.com/  Fact Sheet for Healthcare Providers: https://www.young.biz/  This test is not yet approved or cleared by the [place] FDA and  has been authorized for detection and/or diagnosis of SARS-CoV-2 by  FDA under an Emergency Use Authorization (EUA). This EUA will remain  in effect (meaning this test can be used) for the duration of the  Covid-19 declaration under Section 564(b)(1) of the Act, 21  U.S.C. section 360bbb-3(b)(1), unless the authorization is  terminated or revoked.    Respiratory Syncytial Virus by PCR NEGATIVE NEGATIVE Final    Comment: (NOTE) Fact Sheet for Patients: https://www.moore.com/  Fact Sheet for Healthcare  Providers: https://www.young.biz/  This test is not yet approved or cleared by the [place] FDA and  has been authorized for detection and/or diagnosis of SARS-CoV-2 by  FDA under an Emergency Use Authorization (EUA). This EUA will remain  in effect (meaning this test can be used) for the duration of the  COVID-19 declaration under Section 564(b)(1) of the Act, 21 U.S.C.  section 360bbb-3(b)(1), unless the authorization is terminated or  revoked. Performed at [facility], [address] [place], [place] [address]   Culture, blood (Routine X 2) w Reflex to ID Panel     Status: Abnormal   Collection Time: 04/18/20 10:26 AM  Specimen: BLOOD LEFT HAND  Result Value Ref Range Status   Specimen Description BLOOD LEFT HAND  Final   Special Requests (A)  Final    AEROCOCCUS SPECIES Blood Culture results may not be optimal due to an inadequate volume of blood received in culture bottles   Culture   Final    NO GROWTH 5 DAYS Performed at [facility], [address] [place], [place] [address]    Report Status 04/23/2020 FINAL  Final  Culture, blood (Routine X 2) w Reflex to ID Panel     Status: None   Collection Time: 04/18/20 10:34 AM   Specimen: BLOOD  Result Value Ref Range Status   Specimen Description BLOOD LEFT ANTECUBITAL  Final   Special Requests   Final    BOTTLES DRAWN AEROBIC AND ANAEROBIC Blood Culture adequate volume   Culture   Final    NO GROWTH 5 DAYS Performed at [facility], [address] [place], [place] [address]    Report Status 04/23/2020 FINAL  Final    Coagulation Studies: No results for input(s): LABPROT, INR in the last 72 hours.  Urinalysis: No results for input(s): COLORURINE, LABSPEC, PHURINE, GLUCOSEU, HGBUR, BILIRUBINUR, KETONESUR, PROTEINUR, UROBILINOGEN, NITRITE, LEUKOCYTESUR in the last 72 hours.  Invalid input(s): APPERANCEUR    Imaging: DG Chest Port 1 View  Result Date:  04/23/2020 CLINICAL DATA:  Status post thoracentesis EXAM: PORTABLE CHEST 1 VIEW COMPARISON:  April 21, 2020 FINDINGS: No pneumothorax. There are fairly small pleural effusions bilaterally with atelectasis and consolidation in the left lower lobe. Heart is mildly enlarged with pulmonary vascularity normal. No adenopathy. No bone lesions. IMPRESSION: No pneumothorax. Fairly small pleural effusions bilaterally with atelectasis and questionable superimposed pneumonia left lower lobe. Stable cardiac silhouette. Electronically Signed   By: Bretta BangWilliam  Woodruff III M.D.   On: 04/23/2020 12:05   US THORACENTESIS ASP PLEURAL SPACE W/IMG GUIDE  Result Date: 04/23/2020 INDICATION: Patient with history chronic respiratory failure, AFib, CHF with shortness of breath. Found to have pleural effusion. Request is for therapeutic and diagnostic thoracentesis. EXAM: ULTRASOUND GUIDED THERAPEUTIC AND DIAGNOSTIC THORACENTESIS MEDICATIONS: Lidocaine 1% 10 mL COMPLICATIONS: None immediate. PROCEDURE: An ultrasound guided thoracentesis was thoroughly discussed with the patient and questions answered. The benefits, risks, alternatives and complications were also discussed. The patient understands and wishes to proceed with the procedure. Written consent was obtained. Ultrasound was performed to localize and mark an adequate pocket of fluid in the right chest. The area was then prepped and draped in the normal sterile fashion. 1% Lidocaine was used for local anesthesia. Under ultrasound guidance a 6 Fr Safe-T-Centesis catheter was introduced. Thoracentesis was performed. The catheter was removed and a dressing applied. FINDINGS: A total of approximately 1 L of straw-colored fluid was removed. Samples were sent to the laboratory as requested by the clinical team. IMPRESSION: Successful ultrasound guided therapeutic and diagnostic right sided thoracentesis yielding 1 L of pleural fluid. Read by: Anders GrantJennifer Omohundro, NP Electronically  Signed   By: Simonne ComeJohn  Watts M.D.   On: 04/23/2020 12:12     Medications:   . sodium chloride     . (feeding supplement) PROSource Plus  30 mL Oral BID BM  . amiodarone  200 mg Oral Daily  . apixaban  5 mg Oral BID  . folic acid  1 mg Oral Daily  . furosemide  20 mg Oral QODAY  . hydrocerin  1 application Topical BID  . levothyroxine  75 mcg Oral Q0600  .  metoprolol succinate  50 mg Oral BID AC  . midodrine  2.5 mg Oral TID WC  . multivitamin with minerals  1 tablet Oral Daily  . pantoprazole  40 mg Oral Daily  . sodium chloride flush  3 mL Intravenous Q12H  . sodium chloride  1 g Oral BID WC   sodium chloride, albuterol, dextromethorphan-guaiFENesin, hydrALAZINE, sodium chloride flush  Assessment/ Plan:  Ms. [name] is a 74 y.o.  female with a PMHX of diverticulitis status post colectomy and ileostomy, CHF, hypertension, stroke, A. fib, GERD, hypothyroidism, seizure, IBS, hyponatremia, anemia, chronic respiratory failure with hypoxia on 2 L of oxygen by nasal cannula at night,  , was admitted to [facility] on 04/18/2020 with c/o shortness of breath and chest tightness, started when her nasal cannula was off of her nose during the night. Nephrology was consulted for the management of hyponatremia  #Hyponatremia Sodium levels low, but stable at 127 today  #Acute on chronic diastolic congestive heart failure Echo on 12/06/2019 with EF 55 to 60% Chest Xray on 04/23/2020 IMPRESSION: No pneumothorax. Fairly small pleural effusions bilaterally with atelectasis and questionable superimposed pneumonia left lower lobe. Stable cardiac silhouette Rt Thoracentesis today yielding 1 litre of fluid   #Hypertension #Atrial Fibrillation Rate controlled with Toprol XL Blood pressure readings at low normal range   LOS: 5 Princy Raju 11/22/20212:50 PM   Patient was seen and examined with Denna Haggard, DNP.  Right sided thoracentesis with 1 liter of pleural fluid removed.  Above plan was  discussed and agreed upon on the signing of this note.   Lamont Dowdy, MD [facility]  11/22/20215:18 PM

## 2020-04-23 NOTE — Progress Notes (Signed)
  Heart Failure Nurse Navigator Note  HFpEF 55-60%.  Normal right ventricular systolic  Function.   Mildly elevated pulmonary artery systolic pressure. Moderate LAE.  Mild to moderate RAE.  Mild MR.  She presented with complaints of SOB and chest pain.  BNP was 884.  CXR Moderate vascular congestion with small to moderate pleural effusions.  After admission she was felt to be hyponatremic and hypovolemic.  Today she underwent right side thoracentesis with removal of 1 liter of straw colored fluid.   Co morbidities:  Hypertension Permament atrial fibrillation Anemia   Labs:  Sodium 127, potassium 4.8, BUN 18 and creatinine 0.74, albumin 2.9  Intake and output not documented. Weight 70.2 kg (66.25) BMI 25.7 BP 113/94, 100 A fib on monitor   Assessment:  General- she is awake and alert, just finished lunch.  HEENT- pupils equal, non icteric.  Cardiac- heart tones irregular  Chest- diminished breath sounds bases  Abdomen- soft, non tender  Musculoskeletal- no lower extremity edema.  Psych is pleasant and appropriate.Makes eye contact.  Neuro-speech is clear.   Spoke with patient after she returned from thoracentesis.  She feels her breathing is somewhat better. Discussed that they removed one liter of fluid from the right lung and may have left side done tomorrow.  Discussed her routine for caring for her heart failure. By teach back method she discussed changes in weight and what to report to doctor, importance of weighing daily.  She feels that she stays below 64 ounces daily and also shares when she goes out she  Wears compression stocking and then removes them in the PM.   She remains on 02 per Nasal cannula at 3 liters.   Tresa Endo RN, CHFN

## 2020-04-23 NOTE — Progress Notes (Signed)
Mobility Specialist - Progress Note   04/23/20 1541  Mobility  Activity Dangled on edge of bed;Stood at bedside (took 5 R lateral steps toward [facility])  Level of Assistance Contact guard assist, steadying assist (min. assist S2S)  Assistive Device Front wheel walker  Distance Ambulated (ft) 5 ft  Mobility Response Tolerated well  Mobility performed by Mobility specialist  $Mobility charge 1 Mobility    Pre-mobility: 85 HR, 100% SpO2 During mobility: 100 HR, 99% SpO2 Post-mobility: 89 HR, 99% SpO2  Pt laying in bed upon arrival. Pt agreed to session. Pt able to get to EOB mod. Independently. Pt dangled EOB for a couple of minutes. Pt S2S w/ min. Assist for boosting. Pt stood at bedside for a while w/ CGA. Pt took 5 R lateral steps towards HOB w/ CGA. No LOB noted. No c/o pain or SOB. O2 sat 99-100% t/o session. Pt on 3L O2 [place]. Overall, pt tolerated session well. Pt left laying in bed w/ alarm set. All needs placed in reach.     Thursa Emme Mobility Specialist  04/23/20, 3:44 PM

## 2020-04-23 NOTE — Care Management Important Message (Signed)
Important Message  Patient Details  Name: [name] MRN: [id number] Date of Birth: Mar 24, 1946   Medicare Important Message Given:  Yes     [name] 04/23/2020, 12:22 PM

## 2020-04-23 NOTE — Procedures (Signed)
Ultrasound-guided diagnostic and therapeutic right sided thoracentesis performed yielding 1 liters of straw colored fluid. No immediate complications.   Diagnostic fluid was sent to the lab for further analysis. Follow-up chest x-ray pending. EBL is < 2 ml.      

## 2020-04-24 ENCOUNTER — Inpatient Hospital Stay: Payer: Medicare Other

## 2020-04-24 LAB — BASIC METABOLIC PANEL
Anion gap: 9 (ref 5–15)
BUN: 26 mg/dL — ABNORMAL HIGH (ref 8–23)
CO2: 22 mmol/L (ref 22–32)
Calcium: 8.6 mg/dL — ABNORMAL LOW (ref 8.9–10.3)
Chloride: 96 mmol/L — ABNORMAL LOW (ref 98–111)
Creatinine, Ser: 0.85 mg/dL (ref 0.44–1.00)
GFR, Estimated: >60 mL/min (ref >60)
Glucose, Bld: 95 mg/dL (ref 70–99)
Potassium: 4.6 mmol/L (ref 3.5–5.1)
Sodium: 127 mmol/L — ABNORMAL LOW (ref 135–145)

## 2020-04-24 LAB — PH, BODY FLUID: pH, Body Fluid: 7.5

## 2020-04-24 LAB — CBC
HCT: 35.6 % — ABNORMAL LOW (ref 36.0–46.0)
Hemoglobin: 11.1 g/dL — ABNORMAL LOW (ref 12.0–15.0)
MCH: 25 pg — ABNORMAL LOW (ref 26.0–34.0)
MCHC: 31.2 g/dL (ref 30.0–36.0)
MCV: 80.2 fL (ref 80.0–100.0)
Platelets: 263 10*3/uL (ref 150–400)
RBC: 4.44 MIL/uL (ref 3.87–5.11)
RDW: 16.2 % — ABNORMAL HIGH (ref 11.5–15.5)
WBC: 13.7 10*3/uL — ABNORMAL HIGH (ref 4.0–10.5)
nRBC: 0.3 % — ABNORMAL HIGH (ref 0.0–0.2)

## 2020-04-24 LAB — PROTEIN, PLEURAL OR PERITONEAL FLUID: Total protein, fluid: <3 g/dL

## 2020-04-24 LAB — BODY FLUID CELL COUNT WITH DIFFERENTIAL
Eos, Fluid: 0 %
Lymphs, Fluid: 77 %
Monocyte-Macrophage-Serous Fluid: 10 %
Neutrophil Count, Fluid: 13 %
Total Nucleated Cell Count, Fluid: 553 cu mm

## 2020-04-24 LAB — PROTEIN, BODY FLUID (OTHER): Total Protein, Body Fluid Other: 2.1 g/dL

## 2020-04-24 LAB — RESP PANEL BY RT-PCR (FLU A&B, COVID) ARPGX2
Influenza A by PCR: NEGATIVE
Influenza B by PCR: NEGATIVE
SARS Coronavirus 2 by RT PCR: NEGATIVE

## 2020-04-24 LAB — CYTOLOGY - NON PAP

## 2020-04-24 LAB — LACTATE DEHYDROGENASE, PLEURAL OR PERITONEAL FLUID: LD, Fluid: 58 U/L — ABNORMAL HIGH (ref 3–23)

## 2020-04-24 MED ORDER — AMIODARONE HCL 200 MG PO TABS: 200.0000 mg | ORAL_TABLET | Freq: Two times a day (BID) | ORAL | Status: DC

## 2020-04-24 MED ORDER — SODIUM CHLORIDE 1 G PO TABS: 1.0000 g | ORAL_TABLET | Freq: Two times a day (BID) | ORAL | 0 refills | Status: AC

## 2020-04-24 MED ORDER — FUROSEMIDE 20 MG PO TABS: 20.0000 mg | ORAL_TABLET | Freq: Every day | ORAL | 0 refills | Status: AC

## 2020-04-24 MED ORDER — SALINE SPRAY 0.65 % NA SOLN
2.0000 | NASAL | Status: DC | PRN
  Filled 2020-04-24: qty 44

## 2020-04-24 MED ORDER — SALINE SPRAY 0.65 % NA SOLN: 2.0000 | NASAL | 0 refills | Status: AC | PRN

## 2020-04-24 NOTE — Progress Notes (Signed)
Mobility Specialist - Progress Note   04/24/20 1600  Mobility  Range of Motion/Exercises Right leg;Left leg (ankle pumps, straight leg raises, hip abd/add)  Level of Assistance Standby assist, set-up cues, supervision of patient - no hands on  Assistive Device None  Distance Ambulated (ft) 0 ft  Mobility Response Tolerated well  Mobility performed by Mobility specialist  $Mobility charge 1 Mobility    Pre-mobility: 94 HR, 99% SpO2   Pt was lying in bed upon arrival. Pt agreed to session. Pt denied any pain, nausea, or fatigue. Pt was SBA performing exercises: ankle pumps, straight leg raises, and hip abd/add. Overall, pt tolerated session well. Pt was left in bed with alarm set and all needs in reach.    Filiberto Pinks Mobility Specialist 04/24/20, 4:32 PM

## 2020-04-24 NOTE — Progress Notes (Signed)
This RN attempted to call report to [employer] x3.  No answer.  Ulis Rias, RN 04/24/2020 4:25 PM

## 2020-04-24 NOTE — Progress Notes (Signed)
Occupational Therapy Treatment Patient Details Name: [name] MRN: [id number] DOB: July 09, 1945 Today's Date: 04/24/2020    History of present illness presented to ER from STR secondary to SOB, LE edema; admitted for management of chronic respiratory failure due to CHF.  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   OT comments  Pt instructed in BUE there-ex to improve BUE strength for ADL and mobility tasks. BUE strengthening through bed mobility with instruction in hand/foot placement and bed positioning to optimize pt's independence with bed positioning for improved comfort and skin integrity. Pt demo'd understanding. Pt politely declined OOB for toileting or other ADL, stating she was waiting for the physician to come for her thoracentesis. Pt with all needs in reach at end of session. Continue to recommend SNF as most appropriate venue at discharge.   Follow Up Recommendations  SNF    Equipment Recommendations  Tub/shower bench    Recommendations for Other Services      Precautions / Restrictions Precautions Precautions: Fall Precaution Comments: recent R quadrant ostomy; laparotomy abdominal wound Restrictions Weight Bearing Restrictions: No       Mobility Bed Mobility Overal bed mobility: Modified Independent             General bed mobility comments: cues for bed positioning to improve pt's ability to reposition herself  Transfers                      Balance                                           ADL either performed or assessed with clinical judgement   ADL Overall ADL's : Needs assistance/impaired                                             Vision Baseline Vision/History: Wears glasses Wears Glasses: At all times Patient Visual Report: No change from  baseline     Perception     Praxis      Cognition Arousal/Alertness: Awake/alert Behavior During Therapy: WFL for tasks assessed/performed Overall Cognitive Status: Within Functional Limits for tasks assessed                                 General Comments: Slowed processing        Exercises Exercises: General Lower Extremity General Exercises - Lower Extremity Ankle Circles/Pumps: Both;AROM;15 reps;Supine Heel Slides: Both;AROM;10 reps Hip ABduction/ADduction: Both;AROM;10 reps Other Exercises Other Exercises: Pt instructed in BUE ther-ex to improve BUE strength for ADL   Shoulder Instructions       General Comments Pt remains on 2-3L O2 sats at 94%, HR 107 and irregular at rest.    Pertinent Vitals/ Pain       Pain Assessment: No/denies pain  Home Living                                          Prior Functioning/Environment  Frequency  Min 1X/week        Progress Toward Goals  OT Goals(current goals can now be found in the care plan section)  Progress towards OT goals: Progressing toward goals  Acute Rehab OT Goals Patient Stated Goal: to breath better OT Goal Formulation: With patient Time For Goal Achievement: 04/24/20 Potential to Achieve Goals: Good  Plan Discharge plan remains appropriate;Frequency remains appropriate    Co-evaluation                 AM-PAC OT "6 Clicks" Daily Activity     Outcome Measure   Help from another person eating meals?: None Help from another person taking care of personal grooming?: A Little Help from another person toileting, which includes using toliet, bedpan, or urinal?: A Little Help from another person bathing (including washing, rinsing, drying)?: A Lot Help from another person to put on and taking off regular upper body clothing?: A Little Help from another person to put on and taking off regular lower body clothing?: A Lot 6 Click Score: 17     End of Session    OT Visit Diagnosis: Unsteadiness on feet (R26.81);Muscle weakness (generalized) (M62.81)   Activity Tolerance Patient tolerated treatment well   Patient Left in bed;with call bell/phone within reach;with bed alarm set   Nurse Communication          Time: 1029-1040 OT Time Calculation (min): 11 min  Charges: OT General Charges $OT Visit: 1 Visit OT Treatments $Therapeutic Exercise: 8-22 mins  Richrd Prime, MPH, MS, OTR/L ascom [phone] 04/24/20, 12:49 PM

## 2020-04-24 NOTE — Progress Notes (Signed)
Nutrition Follow-up  DOCUMENTATION CODES:   Non-severe (moderate) malnutrition in context of chronic illness  INTERVENTION:   ProSource Plus po BID, provides 100kcal and 15g of protein per serving   Magic cup TID with meals, each supplement provides 290 kcal and 9 grams of protein  MVI daily   NUTRITION DIAGNOSIS:   Moderate Malnutrition related to chronic illness (CHF, h/o CVA) as evidenced by moderate fat depletion, severe fat depletion, moderate muscle depletion, severe muscle depletion.  GOAL:   Patient will meet greater than or equal to 90% of their needs -progressing   MONITOR:   Supplement acceptance, PO intake, Labs, Weight trends, Skin, I & O's  ASSESSMENT:   74 y.o. 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 Pt reports that she does not like any supplements as they make her vomit. Pt has been receiving Magic Cups on meal trays and has been being offered Pro-Source Plus of which she mostly declines. Will continue current supplements as pt is eating some of the Magic Cups and is taking some Pro-Source and she refuses all other supplements. Per chart, pt appears fairly weight stable since admit.   Medications reviewed and include: folic acid, lasix, synthroid, MVI, protonix, NaCl tabs  Labs reviewed: Na 127(L), BUN 26(H) Wbc- 13.7(H)  NUTRITION - FOCUSED PHYSICAL EXAM:    Most Recent Value  Orbital Region Severe depletion  Upper Arm Region Moderate depletion  Thoracic and Lumbar Region Moderate depletion  Buccal Region Moderate depletion  Temple Region Moderate depletion  Clavicle Bone Region Severe depletion  Clavicle and  Acromion Bone Region Severe depletion  Scapular Bone Region Moderate depletion  Dorsal Hand Severe depletion  Patellar Region Moderate depletion  Anterior Thigh Region Moderate depletion  Posterior Calf Region Severe depletion  Edema (RD Assessment) Mild  Hair Reviewed  Eyes Reviewed  Mouth Reviewed  Skin Reviewed  Nails Reviewed     Diet Order:   Diet Order            Diet 2 gram sodium Room service appropriate? Yes; Fluid consistency: Thin; Fluid restriction: 1200 mL Fluid  Diet effective now                EDUCATION NEEDS:   Education needs have been addressed  Skin:  Skin Assessment: Reviewed RN Assessment (non healing surgical abdominal wound 1 cm x 1 cmx 1 cm)  Last BM:  11/23- via ostomy  Height:   Ht Readings from Last 1 Encounters:  04/18/20 5\' 5"  (1.651 m)    Weight:   Wt Readings from Last 1 Encounters:  04/24/20 67.5 kg    Ideal Body Weight:  56.8 kg  BMI:  Body mass index is 24.78 kg/m.  Estimated Nutritional Needs:   Kcal:  1400-1600kcal/day  Protein:  70-80g/day  Fluid:  1.2L/day  04/26/20 MS, RD, LDN Please refer to [facility] for RD and/or RD on-call/weekend/after hours pager

## 2020-04-24 NOTE — TOC Transition Note (Signed)
Transition of Care [facility]) - CM/SW Discharge Note   Patient Details  Name: [name] MRN: [id number] Date of Birth: July 14, 1945  Transition of Care [facility]) CM/SW Contact:  Maree Krabbe, LCSW Phone Number: 04/24/2020, 2:55 PM   Clinical Narrative:  Clinical Social Worker facilitated patient discharge including contacting patient family and facility to confirm patient discharge plans.  Clinical information faxed to facility and family agreeable with plan.  CSW arranged ambulance transport via [facility] to [facility] .  RN to call [phone] for report prior to discharge.    Final next level of care: Skilled Nursing Facility Barriers to Discharge: No Barriers Identified   Patient Goals and CMS Choice Patient states their goals for this hospitalization and ongoing recovery are:: to return to [facility]   Choice offered to / list presented to : Patient  Discharge Placement              Patient chooses bed at: [facility] Patient to be transferred to facility by: [facility]   Patient and family notified of of transfer: 04/24/20  Discharge Plan and Services In-house Referral: Clinical Social Work   Post Acute Care Choice: Skilled Nursing Facility                               Social Determinants of Health (SDOH) Interventions     Readmission Risk Interventions Readmission Risk Prevention Plan 04/20/2020 04/10/2020 03/30/2020  Transportation Screening Complete Complete Complete  PCP or Specialist Appt within 3-5 Days - - Complete  HRI or Home Care Consult - - Complete  Social Work Consult for Recovery Care Planning/Counseling - - Complete  Palliative Care Screening - - Not Applicable  Medication Review Oceanographer) Complete Complete Complete  PCP or Specialist appointment within 3-5 days of discharge Complete Complete -  HRI or Home Care Consult Complete Complete -  SW Recovery Care/Counseling Consult Complete Complete -   Palliative Care Screening Not Applicable Not Applicable -  Skilled Nursing Facility Complete Complete -  Some recent data might be hidden

## 2020-04-24 NOTE — Progress Notes (Signed)
[facility]  ROUNDING NOTE   Subjective:   Patient reports feeling better after the Rt.thoracentesis yesterday.She still uses O2 as needed, denies worsening SOB.  Objective:  Vital signs in last 24 hours:  Temp:  [97.8 F (36.6 C)-98.2 F (36.8 C)] 98.2 F (36.8 C) (11/23 0752) Pulse Rate:  [83-100] 100 (11/23 1201) Resp:  [16-18] 16 (11/23 0752) BP: (97-104)/(72-85) 102/74 (11/23 1201) SpO2:  [99 %-100 %] 100 % (11/23 1201) Weight:  [67.5 kg] 67.5 kg (11/23 0528)  Weight change: -2.523 kg Filed Weights   04/23/20 0500 04/23/20 0934 04/24/20 0528  Weight: 70.2 kg 67.7 kg 67.5 kg    Intake/Output: I/O last 3 completed shifts: In: 603 [P.O.:600; I.V.:3] Out: 3050 [Urine:1950; Stool:1100]   Intake/Output this shift:  Total I/O In: 240 [P.O.:240] Out: 100 [Urine:100]  Physical Exam: General: In no acute distress  Head: Normocephalic,atraumatic  Eyes: Sclerae and conjunctivae clear  Lungs:  Respirations even,unlabored,lungs clear  Heart: S1S2, no rubs or gallops  Abdomen:  Soft, nontender  Extremities:  No  peripheral edema.  Neurologic: Awake,alert,oriented  Skin: No acute lesions or rashes    Basic Metabolic Panel: Recent Labs  Lab 04/18/20 2351 04/18/20 2351 04/19/20 0909 04/19/20 0909 04/20/20 0405 04/20/20 0405 04/21/20 0408 04/22/20 0407 04/24/20 0420  NA 117*   < > 123*  --  127*  --  128* 127* 127*  K 3.5   < > 3.6  --  4.3  --  4.1 4.8 4.6  CL 86*   < > 89*  --  97*  --  97* 96* 96*  CO2 21*   < > 24  --  20*  --  23 21* 22  GLUCOSE 116*   < > 110*  --  96  --  108* 105* 95  BUN 13   < > 12  --  12  --  13 19 26*  CREATININE 0.72   < > 0.73  --  0.67  --  0.64 0.74 0.85  CALCIUM 7.8*   < > 8.4*   < > 8.3*   < > 8.7* 8.7* 8.6*  MG 1.9  --   --   --   --   --   --   --   --   PHOS  --   --  2.8  --   --   --   --   --   --    < > = values in this interval not displayed.    Liver Function Tests: Recent Labs  Lab 04/18/20 0540  04/19/20 0909 04/20/20 0405 04/21/20 0408 04/22/20 0407  AST 67*  --  32 31 39  ALT 65*  --  48* 44 43  ALKPHOS 104  --  103 107 97  BILITOT 1.1  --  0.5 0.5 0.9  PROT 6.8  --  6.1* 6.5 6.0*  ALBUMIN 3.2* 3.1* 2.8* 3.1* 2.9*   No results for input(s): LIPASE, AMYLASE in the last 168 hours. No results for input(s): AMMONIA in the last 168 hours.  CBC: Recent Labs  Lab 04/18/20 0540 04/18/20 2351 04/19/20 0918 04/20/20 0405 04/21/20 0408 04/22/20 0407 04/24/20 0420  WBC 16.0*   < > 14.4* 12.0* 14.8* 15.6* 13.7*  NEUTROABS 11.8*  --  11.5* 9.4* 11.5* 13.0*  --   HGB 11.5*   < > 11.4* 11.1* 11.1* 10.8* 11.1*  HCT 35.6*   < > 36.0 35.6* 35.5* 34.5* 35.6*  MCV 78.9*   < >  80.0 80.7 80.0 80.2 80.2  PLT 406*   < > 371 336 306 274 263   < > = values in this interval not displayed.    Cardiac Enzymes: No results for input(s): CKTOTAL, CKMB, CKMBINDEX, TROPONINI in the last 168 hours.  BNP: Invalid input(s): POCBNP  CBG: No results for input(s): GLUCAP in the last 168 hours.  Microbiology: Results for orders placed or performed during the hospital encounter of 04/18/20  Resp Panel by RT PCR (RSV, Flu A&B, Covid) - Nasopharyngeal Swab     Status: None   Collection Time: 04/18/20  8:44 AM   Specimen: Nasopharyngeal Swab  Result Value Ref Range Status   SARS Coronavirus 2 by RT PCR NEGATIVE NEGATIVE Final    Comment: (NOTE) SARS-CoV-2 target nucleic acids are NOT DETECTED.  The SARS-CoV-2 RNA is generally detectable in upper respiratoy specimens during the acute phase of infection. The lowest concentration of SARS-CoV-2 viral copies this assay can detect is 131 copies/mL. A negative result does not preclude SARS-Cov-2 infection and should not be used as the sole basis for treatment or other patient management decisions. A negative result may occur with  improper specimen collection/handling, submission of specimen other than nasopharyngeal swab, presence of viral  mutation(s) within the areas targeted by this assay, and inadequate number of viral copies (<131 copies/mL). A negative result must be combined with clinical observations, patient history, and epidemiological information. The expected result is Negative.  Fact Sheet for Patients:  https://www.moore.com/  Fact Sheet for Healthcare Providers:  https://www.young.biz/  This test is no t yet approved or cleared by the [place] FDA and  has been authorized for detection and/or diagnosis of SARS-CoV-2 by FDA under an Emergency Use Authorization (EUA). This EUA will remain  in effect (meaning this test can be used) for the duration of the COVID-19 declaration under Section 564(b)(1) of the Act, 21 U.S.C. section 360bbb-3(b)(1), unless the authorization is terminated or revoked sooner.     Influenza A by PCR NEGATIVE NEGATIVE Final   Influenza B by PCR NEGATIVE NEGATIVE Final    Comment: (NOTE) The Xpert Xpress SARS-CoV-2/FLU/RSV assay is intended as an aid in  the diagnosis of influenza from Nasopharyngeal swab specimens and  should not be used as a sole basis for treatment. Nasal washings and  aspirates are unacceptable for Xpert Xpress SARS-CoV-2/FLU/RSV  testing.  Fact Sheet for Patients: https://www.moore.com/  Fact Sheet for Healthcare Providers: https://www.young.biz/  This test is not yet approved or cleared by the [place] FDA and  has been authorized for detection and/or diagnosis of SARS-CoV-2 by  FDA under an Emergency Use Authorization (EUA). This EUA will remain  in effect (meaning this test can be used) for the duration of the  Covid-19 declaration under Section 564(b)(1) of the Act, 21  U.S.C. section 360bbb-3(b)(1), unless the authorization is  terminated or revoked.    Respiratory Syncytial Virus by PCR NEGATIVE NEGATIVE Final    Comment: (NOTE) Fact Sheet for  Patients: https://www.moore.com/  Fact Sheet for Healthcare Providers: https://www.young.biz/  This test is not yet approved or cleared by the [place] FDA and  has been authorized for detection and/or diagnosis of SARS-CoV-2 by  FDA under an Emergency Use Authorization (EUA). This EUA will remain  in effect (meaning this test can be used) for the duration of the  COVID-19 declaration under Section 564(b)(1) of the Act, 21 U.S.C.  section 360bbb-3(b)(1), unless the authorization is terminated or  revoked. Performed at [facility]  [facility], [address] [place], [place] [address]   Culture, blood (Routine X 2) w Reflex to ID Panel     Status: Abnormal   Collection Time: 04/18/20 10:26 AM   Specimen: BLOOD LEFT HAND  Result Value Ref Range Status   Specimen Description BLOOD LEFT HAND  Final   Special Requests (A)  Final    AEROCOCCUS SPECIES Blood Culture results may not be optimal due to an inadequate volume of blood received in culture bottles   Culture   Final    NO GROWTH 5 DAYS Performed at [facility], [address] [place], [place] [address]    Report Status 04/23/2020 FINAL  Final  Culture, blood (Routine X 2) w Reflex to ID Panel     Status: None   Collection Time: 04/18/20 10:34 AM   Specimen: BLOOD  Result Value Ref Range Status   Specimen Description BLOOD LEFT ANTECUBITAL  Final   Special Requests   Final    BOTTLES DRAWN AEROBIC AND ANAEROBIC Blood Culture adequate volume   Culture   Final    NO GROWTH 5 DAYS Performed at [facility], [address] [place], [place] [address]    Report Status 04/23/2020 FINAL  Final  Body fluid culture     Status: None (Preliminary result)   Collection Time: 04/23/20 11:37 AM   Specimen: PATH Cytology Pleural fluid  Result Value Ref Range Status   Specimen Description   Final    PLEURAL Performed at [facility], [address]  [place], [place] [address]    Special Requests   Final    NONE Performed at [facility], [address] [place], [place] [address]    Gram Stain   Final    RARE WBC PRESENT, PREDOMINANTLY MONONUCLEAR NO ORGANISMS SEEN    Culture   Final    NO GROWTH < 12 HOURS Performed at [facility], [address] [place], [place] [address]    Report Status PENDING  Incomplete  Resp Panel by RT-PCR (Flu A&B, Covid) Nasopharyngeal Swab     Status: None   Collection Time: 04/24/20  1:19 PM   Specimen: Nasopharyngeal Swab; Nasopharyngeal(NP) swabs in vial transport medium  Result Value Ref Range Status   SARS Coronavirus 2 by RT PCR NEGATIVE NEGATIVE Final    Comment: (NOTE) SARS-CoV-2 target nucleic acids are NOT DETECTED.  The SARS-CoV-2 RNA is generally detectable in upper respiratory specimens during the acute phase of infection. The lowest concentration of SARS-CoV-2 viral copies this assay can detect is 138 copies/mL. A negative result does not preclude SARS-Cov-2 infection and should not be used as the sole basis for treatment or other patient management decisions. A negative result may occur with  improper specimen collection/handling, submission of specimen other than nasopharyngeal swab, presence of viral mutation(s) within the areas targeted by this assay, and inadequate number of viral copies(<138 copies/mL). A negative result must be combined with clinical observations, patient history, and epidemiological information. The expected result is Negative.  Fact Sheet for Patients:  BloggerCourse.comhttps://www.fda.gov/media/152166/download  Fact Sheet for Healthcare Providers:  SeriousBroker.ithttps://www.fda.gov/media/152162/download  This test is no t yet approved or cleared by the [place] FDA and  has been authorized for detection and/or diagnosis of SARS-CoV-2 by FDA under an Emergency Use Authorization (EUA). This EUA will remain  in effect (meaning this test can be used) for the  duration of the COVID-19 declaration under Section 564(b)(1) of the Act, 21 U.S.C.section  360bbb-3(b)(1), unless the authorization is terminated  or revoked sooner.       Influenza A by PCR NEGATIVE NEGATIVE Final   Influenza B by PCR NEGATIVE NEGATIVE Final    Comment: (NOTE) The Xpert Xpress SARS-CoV-2/FLU/RSV plus assay is intended as an aid in the diagnosis of influenza from Nasopharyngeal swab specimens and should not be used as a sole basis for treatment. Nasal washings and aspirates are unacceptable for Xpert Xpress SARS-CoV-2/FLU/RSV testing.  Fact Sheet for Patients: BloggerCourse.com  Fact Sheet for Healthcare Providers: SeriousBroker.it  This test is not yet approved or cleared by the [place] FDA and has been authorized for detection and/or diagnosis of SARS-CoV-2 by FDA under an Emergency Use Authorization (EUA). This EUA will remain in effect (meaning this test can be used) for the duration of the COVID-19 declaration under Section 564(b)(1) of the Act, 21 U.S.C. section 360bbb-3(b)(1), unless the authorization is terminated or revoked.  Performed at [facility], [address] [place], [place] [address]     Coagulation Studies: No results for input(s): LABPROT, INR in the last 72 hours.  Urinalysis: No results for input(s): COLORURINE, LABSPEC, PHURINE, GLUCOSEU, HGBUR, BILIRUBINUR, KETONESUR, PROTEINUR, UROBILINOGEN, NITRITE, LEUKOCYTESUR in the last 72 hours.  Invalid input(s): APPERANCEUR    Imaging: DG Chest Port 1 View  Result Date: 04/24/2020 CLINICAL DATA:  Left thoracentesis. EXAM: PORTABLE CHEST 1 VIEW COMPARISON:  Chest x-ray 04/23/2020. FINDINGS: Cardiomegaly and pulmonary venous congestion again noted. Bilateral interstitial prominence noted. Low lung volumes with bibasilar atelectasis. Small left pleural effusion, improved from prior exam. No evidence of pneumothorax post  thoracentesis. IMPRESSION: 1. No evidence of pneumothorax post thoracentesis. 2. Cardiomegaly with pulmonary venous congestion and bilateral interstitial prominence again noted. Small left pleural effusion, improved from prior exam. Electronically Signed   By: Maisie Fus  Register   On: 04/24/2020 12:29   DG Chest Port 1 View  Result Date: 04/23/2020 CLINICAL DATA:  Status post thoracentesis EXAM: PORTABLE CHEST 1 VIEW COMPARISON:  April 21, 2020 FINDINGS: No pneumothorax. There are fairly small pleural effusions bilaterally with atelectasis and consolidation in the left lower lobe. Heart is mildly enlarged with pulmonary vascularity normal. No adenopathy. No bone lesions. IMPRESSION: No pneumothorax. Fairly small pleural effusions bilaterally with atelectasis and questionable superimposed pneumonia left lower lobe. Stable cardiac silhouette. Electronically Signed   By: Bretta Bang III M.D.   On: 04/23/2020 12:05   US THORACENTESIS ASP PLEURAL SPACE W/IMG GUIDE  Result Date: 04/24/2020 INDICATION: Patient with history of congestive heart failure, chronic atrial fibrillation, prior CVA, recent subtotal colectomy and ileostomy, bilateral pleural effusions, dyspnea ; status post right thoracentesis on 04/23/20 ; request now received for diagnostic and therapeutic left thoracentesis. EXAM: ULTRASOUND GUIDED DIAGNOSTIC AND THERAPEUTIC LEFT THORACENTESIS MEDICATIONS: 1% lidocaine to skin and subcutaneous tissue COMPLICATIONS: None immediate. PROCEDURE: An ultrasound guided thoracentesis was thoroughly discussed with the patient and questions answered. The benefits, risks, alternatives and complications were also discussed. The patient understands and wishes to proceed with the procedure. Written consent was obtained. Ultrasound was performed to localize and mark an adequate pocket of fluid in the left chest. The area was then prepped and draped in the normal sterile fashion. 1% Lidocaine was used for local  anesthesia. Under ultrasound guidance a 6 Fr Safe-T-Centesis catheter was introduced. Thoracentesis was performed. The catheter was removed and a dressing applied. FINDINGS: A total of approximately 750 cc of yellow fluid was removed. Samples were sent to the laboratory as requested by the clinical  team. IMPRESSION: Successful ultrasound guided diagnostic and therapeutic left thoracentesis yielding 750 cc of pleural fluid. Read by: Jeananne Rama, PA-C Electronically Signed   By: Elige Ko   On: 04/24/2020 12:07   US THORACENTESIS ASP PLEURAL SPACE W/IMG GUIDE  Result Date: 04/23/2020 INDICATION: Patient with history chronic respiratory failure, AFib, CHF with shortness of breath. Found to have pleural effusion. Request is for therapeutic and diagnostic thoracentesis. EXAM: ULTRASOUND GUIDED THERAPEUTIC AND DIAGNOSTIC THORACENTESIS MEDICATIONS: Lidocaine 1% 10 mL COMPLICATIONS: None immediate. PROCEDURE: An ultrasound guided thoracentesis was thoroughly discussed with the patient and questions answered. The benefits, risks, alternatives and complications were also discussed. The patient understands and wishes to proceed with the procedure. Written consent was obtained. Ultrasound was performed to localize and mark an adequate pocket of fluid in the right chest. The area was then prepped and draped in the normal sterile fashion. 1% Lidocaine was used for local anesthesia. Under ultrasound guidance a 6 Fr Safe-T-Centesis catheter was introduced. Thoracentesis was performed. The catheter was removed and a dressing applied. FINDINGS: A total of approximately 1 L of straw-colored fluid was removed. Samples were sent to the laboratory as requested by the clinical team. IMPRESSION: Successful ultrasound guided therapeutic and diagnostic right sided thoracentesis yielding 1 L of pleural fluid. Read by: Anders Grant, NP Electronically Signed   By: Simonne Come M.D.   On: 04/23/2020 12:12     Medications:     sodium chloride      (feeding supplement) PROSource Plus  30 mL Oral BID BM   amiodarone  200 mg Oral BID   apixaban  5 mg Oral BID   folic acid  1 mg Oral Daily   furosemide  20 mg Oral QODAY   hydrocerin  1 application Topical BID   levothyroxine  75 mcg Oral Q0600   metoprolol succinate  50 mg Oral BID AC   midodrine  2.5 mg Oral TID WC   multivitamin with minerals  1 tablet Oral Daily   pantoprazole  40 mg Oral Daily   sodium chloride flush  3 mL Intravenous Q12H   sodium chloride  1 g Oral BID WC   sodium chloride, albuterol, dextromethorphan-guaiFENesin, hydrALAZINE, sodium chloride, sodium chloride flush  Assessment/ Plan:  Ms. KINZEE HAPPEL is a 74 y.o.  female with a PMHX of diverticulitis status post colectomy and ileostomy, CHF, hypertension, stroke, A. fib, GERD, hypothyroidism, seizure, IBS, hyponatremia, anemia, chronic respiratory failure with hypoxia on 2 L of oxygen by nasal cannula at night,  , was admitted to [facility] on 04/18/2020 with c/o shortness of breath and chest tightness, started when her nasal cannula was off of her nose during the night. Nephrology was consulted for the management of hyponatremia  #Hyponatremia Stays stable Na+ at 127 Nephrology will follow up as outpatient if getting discharged today  #Acute on chronic diastolic congestive heart failure Requiring 2-3L O2 as needed RT.Thoracentesis yesterday yielded 1L fluid Planning left thoracentesis today   #Hypertension #Atrial Fibrillation Rate controlled with Toprol XL Stays normotensive   LOS: 6 Himani Corona 11/23/20213:09 PM

## 2020-04-24 NOTE — Discharge Summary (Signed)
[facility] [place] at [facility]   PATIENT NAME: [name]    MR#:  [id number]  DATE OF BIRTH:  December 10, 1945  DATE OF ADMISSION:  04/18/2020 ADMITTING PHYSICIAN: Lorretta Harp, MD  DATE OF DISCHARGE: 04/24/2020  PRIMARY CARE PHYSICIAN: Mickel Fuchs, MD    ADMISSION DIAGNOSIS:  Hyponatremia [E87.1] Acute on chronic diastolic CHF (congestive heart failure) (HCC) [I50.33]  DISCHARGE DIAGNOSIS:  Principal Problem:   Acute on chronic diastolic CHF (congestive heart failure) (HCC) Active Problems:   Chronic respiratory failure with hypoxia (HCC)   Hypertension   Stroke (HCC)   Hypothyroidism   Acute on chronic respiratory failure with hypoxia (HCC)   Hyponatremia   Atrial fibrillation, chronic (HCC)   Leukocytosis   GERD (gastroesophageal reflux disease)   Pleural effusion   SECONDARY DIAGNOSIS:   Past Medical History:  Diagnosis Date  . Acute respiratory failure (HCC)    Secondary to aspiration pneumonia   . Altered mental status    Secondary to viral herpes simplex virus encephalitis  . Anemia   . Atrial fibrillation (HCC)   . Bilateral pneumonia   . CHF (congestive heart failure) (HCC)   . Dysphasia   . Encephalitis   . Hyperglycemia   . Hypertension   . Hyponatremia   . IBS (irritable bowel syndrome)   . Seizures (HCC)   . Stroke [facility])     HOSPITAL COURSE:   1. Hypervolemic hyponatremia. Patient had bilateral pleural effusions. Continue low-dose Lasix 20 mg daily and salt tablets. Nephrology okay with discharge with sodium of 127 since this is more chronic in nature. Recommend checking a BMP weekly. 2. Bilateral pleural effusions. Thoracentesis on 04/23/2020 drew off 1 L underneath the right lung. Thoracentesis on 04/24/2020 drew off 750 mL underneath the left lung. Continue low-dose Lasix. So far nothing growing out of the culture underneath the right lung. Follow-up cultures underneath the left lung. If fluid reaccumulates as outpatient  can set up with interventional radiology. The order would have to come from an outpatient physician. 3. Acute on chronic hypoxic respiratory failure. Initially wears oxygen at night but would continue oxygen 24/7 at this point time 2 L nasal cannula. 4. Acute on chronic diastolic congestive heart failure. Continue low-dose Lasix. Continue Toprol XL twice daily 5. Chronic atrial fibrillation. Continue Eliquis for anticoagulation. Amiodarone and metoprolol for rate control. Current heart rate in the 80s. 6. Chronic hypotension on midodrine 7. Hypothyroidism unspecified on Synthroid 8. 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 Iron deficiency anemia. Last hemoglobin 11.1. 10. Recent subtotal colectomy and ileostomy. Previous E. coli enterococcal infection from the wound. Status post treatment with antibiotics. Follow-up with surgery as outpatient. 11. Palliative care to follow-up as outpatient.   DISCHARGE CONDITIONS:   Satisfactory  CONSULTS OBTAINED:  Nephrology Interventional radiology  DRUG ALLERGIES:   Allergies  Allergen Reactions  . Prednisone Hives  . Predicort [Prednisolone Acetate]     HIVES,    DISCHARGE MEDICATIONS:   Allergies as of 04/24/2020      Reactions   Prednisone Hives   Predicort [prednisolone Acetate]    HIVES,      Medication List    STOP taking these medications   diltiazem 120 MG 24 hr capsule Commonly known as: CARDIZEM CD   hydrocortisone 2.5 % rectal cream Commonly known as: ANUSOL-HC     TAKE these medications   acetaminophen 325 MG tablet Commonly known as: TYLENOL Take 650 mg by mouth every 6 (six) hours as needed.  amiodarone 200 MG tablet Commonly known as: PACERONE Take 1 tablet (200 mg total) by mouth daily.   apixaban 5 MG Tabs tablet Commonly known as: ELIQUIS Take 1 tablet (5 mg total) by mouth 2 (two) times daily.   Ensure Max Protein Liqd Take 330 mLs (11 oz total) by mouth 2 (two) times daily.   folic acid 1 MG  tablet Commonly known as: FOLVITE Take 1 mg by mouth daily. Per tube once daily   furosemide 20 MG tablet Commonly known as: LASIX Take 1 tablet (20 mg total) by mouth daily. What changed:  medication strength how much to take   hydrocerin Crea Apply 1 application topically 2 (two) times daily.   levothyroxine 75 MCG tablet Commonly known as: SYNTHROID Take 75 mcg by mouth daily before breakfast.   metoprolol succinate 50 MG 24 hr tablet Commonly known as: TOPROL-XL Take 1 tablet (50 mg total) by mouth 2 (two) times daily. Take with or immediately following a meal.   midodrine 2.5 MG tablet Commonly known as: PROAMATINE Take 1 tablet (2.5 mg total) by mouth 3 (three) times daily with meals.   multivitamin with minerals Tabs tablet Take 1 tablet by mouth daily.   omeprazole 20 MG capsule Commonly known as: PRILOSEC Take 20 mg by mouth daily.   potassium chloride 10 MEQ tablet Commonly known as: KLOR-CON Take 10 mEq by mouth daily.   sodium chloride 0.65 % Soln nasal spray Commonly known as: OCEAN Place 2 sprays into both nostrils every 2 (two) hours as needed for congestion.   sodium chloride 1 g tablet Take 1 tablet (1 g total) by mouth 2 (two) times daily with a meal.        DISCHARGE INSTRUCTIONS:   Follow-up Dr. At facility 1 day Follow-up palliative care Follow-up CHF clinic  If you experience worsening of your admission symptoms, develop shortness of breath, life threatening emergency, suicidal or homicidal thoughts you must seek medical attention immediately by calling 911 or calling your MD immediately  if symptoms less severe.  You Must read complete instructions/literature along with all the possible adverse reactions/side effects for all the Medicines you take and that have been prescribed to you. Take any new Medicines after you have completely understood and accept all the possible adverse reactions/side effects.   Please note  You were cared  for by a hospitalist during your hospital stay. If you have any questions about your discharge medications or the care you received while you were in the hospital after you are discharged, you can call the unit and asked to speak with the hospitalist on call if the hospitalist that took care of you is not available. Once you are discharged, your primary care physician will handle any further medical issues. Please note that NO REFILLS for any discharge medications will be authorized once you are discharged, as it is imperative that you return to your primary care physician (or establish a relationship with a primary care physician if you do not have one) for your aftercare needs so that they can reassess your need for medications and monitor your lab values.    Today   CHIEF COMPLAINT:   Chief Complaint  Patient presents with  . Shortness of Breath    HISTORY OF PRESENT ILLNESS:  [name]  is a 74 y.o. female came in with shortness of breath   VITAL SIGNS:  Blood pressure 102/74, pulse 100, temperature 98.2 F (36.8 C), temperature source Oral, resp. rate 16,  height  (1.651 m), weight 67.5 kg, SpO2 100 %. Current heart rate 87. I/O:    Intake/Output Summary (Last 24 hours) at 04/24/2020 1306 Last data filed at 04/24/2020 0803 Gross per 24 hour  Intake 600 ml  Output 2150 ml  Net -1550 ml    PHYSICAL EXAMINATION:  GENERAL:  74 y.o.-year-old patient lying in the bed with no acute distress.  EYES: Pupils equal, round, reactive to light and accommodation. No scleral icterus. Extraocular muscles intact.  HEENT: Head atraumatic, normocephalic. Oropharynx and nasopharynx clear.  LUNGS: Decreased breath sounds bilateral bases, no wheezing, rales,rhonchi or crepitation. No use of accessory muscles of respiration.  CARDIOVASCULAR: S1, S2 regular regular. No murmurs, rubs, or gallops.  ABDOMEN: Soft, non-tender.  EXTREMITIES: No pedal edema.  NEUROLOGIC: Cranial nerves II  through XII are intact. Muscle strength 5/5 in all extremities. Sensation intact. Gait not checked.  PSYCHIATRIC: The patient is alert and oriented x 3.  SKIN: No obvious rash, lesion, or ulcer.   DATA REVIEW:   CBC Recent Labs  Lab 04/24/20 0420  WBC 13.7*  HGB 11.1*  HCT 35.6*  PLT 263    Chemistries  Recent Labs  Lab 04/18/20 2351 04/19/20 0909 04/22/20 0407 04/22/20 0407 04/24/20 0420  NA 117*   < > 127*   < > 127*  K 3.5   < > 4.8   < > 4.6  CL 86*   < > 96*   < > 96*  CO2 21*   < > 21*   < > 22  GLUCOSE 116*   < > 105*   < > 95  BUN 13   < > 19   < > 26*  CREATININE 0.72   < > 0.74   < > 0.85  CALCIUM 7.8*   < > 8.7*   < > 8.6*  MG 1.9  --   --   --   --   AST  --    < > 39  --   --   ALT  --    < > 43  --   --   ALKPHOS  --    < > 97  --   --   BILITOT  --    < > 0.9  --   --    < > = values in this interval not displayed.    Microbiology Results  Results for orders placed or performed during the hospital encounter of 04/18/20  Resp Panel by RT PCR (RSV, Flu A&B, Covid) - Nasopharyngeal Swab     Status: None   Collection Time: 04/18/20  8:44 AM   Specimen: Nasopharyngeal Swab  Result Value Ref Range Status   SARS Coronavirus 2 by RT PCR NEGATIVE NEGATIVE Final    Comment: (NOTE) SARS-CoV-2 target nucleic acids are NOT DETECTED.  The SARS-CoV-2 RNA is generally detectable in upper respiratoy specimens during the acute phase of infection. The lowest concentration of SARS-CoV-2 viral copies this assay can detect is 131 copies/mL. A negative result does not preclude SARS-Cov-2 infection and should not be used as the sole basis for treatment or other patient management decisions. A negative result may occur with  improper specimen collection/handling, submission of specimen other than nasopharyngeal swab, presence of viral mutation(s) within the areas targeted by this assay, and inadequate number of viral copies (<131 copies/mL). A negative result must be  combined with clinical observations, patient history, and epidemiological information. The expected result is Negative.  Fact Sheet for Patients:  https://www.moore.com/  Fact Sheet for Healthcare Providers:  https://www.young.biz/  This test is no t yet approved or cleared by the [place] FDA and  has been authorized for detection and/or diagnosis of SARS-CoV-2 by FDA under an Emergency Use Authorization (EUA). This EUA will remain  in effect (meaning this test can be used) for the duration of the COVID-19 declaration under Section 564(b)(1) of the Act, 21 U.S.C. section 360bbb-3(b)(1), unless the authorization is terminated or revoked sooner.     Influenza A by PCR NEGATIVE NEGATIVE Final   Influenza B by PCR NEGATIVE NEGATIVE Final    Comment: (NOTE) The Xpert Xpress SARS-CoV-2/FLU/RSV assay is intended as an aid in  the diagnosis of influenza from Nasopharyngeal swab specimens and  should not be used as a sole basis for treatment. Nasal washings and  aspirates are unacceptable for Xpert Xpress SARS-CoV-2/FLU/RSV  testing.  Fact Sheet for Patients: https://www.moore.com/  Fact Sheet for Healthcare Providers: https://www.young.biz/  This test is not yet approved or cleared by the [place] FDA and  has been authorized for detection and/or diagnosis of SARS-CoV-2 by  FDA under an Emergency Use Authorization (EUA). This EUA will remain  in effect (meaning this test can be used) for the duration of the  Covid-19 declaration under Section 564(b)(1) of the Act, 21  U.S.C. section 360bbb-3(b)(1), unless the authorization is  terminated or revoked.    Respiratory Syncytial Virus by PCR NEGATIVE NEGATIVE Final    Comment: (NOTE) Fact Sheet for Patients: https://www.moore.com/  Fact Sheet for Healthcare Providers: https://www.young.biz/  This test is  not yet approved or cleared by the [place] FDA and  has been authorized for detection and/or diagnosis of SARS-CoV-2 by  FDA under an Emergency Use Authorization (EUA). This EUA will remain  in effect (meaning this test can be used) for the duration of the  COVID-19 declaration under Section 564(b)(1) of the Act, 21 U.S.C.  section 360bbb-3(b)(1), unless the authorization is terminated or  revoked. Performed at [facility], [address] [place], [place] [address]   Culture, blood (Routine X 2) w Reflex to ID Panel     Status: Abnormal   Collection Time: 04/18/20 10:26 AM   Specimen: BLOOD LEFT HAND  Result Value Ref Range Status   Specimen Description BLOOD LEFT HAND  Final   Special Requests (A)  Final    AEROCOCCUS SPECIES Blood Culture results may not be optimal due to an inadequate volume of blood received in culture bottles   Culture   Final    NO GROWTH 5 DAYS Performed at [facility] [place], [address] [place], [place] [address]    Report Status 04/23/2020 FINAL  Final  Culture, blood (Routine X 2) w Reflex to ID Panel     Status: None   Collection Time: 04/18/20 10:34 AM   Specimen: BLOOD  Result Value Ref Range Status   Specimen Description BLOOD LEFT ANTECUBITAL  Final   Special Requests   Final    BOTTLES DRAWN AEROBIC AND ANAEROBIC Blood Culture adequate volume   Culture   Final    NO GROWTH 5 DAYS Performed at [facility], [address] [place], [place] [address]    Report Status 04/23/2020 FINAL  Final  Body fluid culture     Status: None (Preliminary result)   Collection Time: 04/23/20 11:37 AM   Specimen: PATH Cytology Pleural fluid  Result Value Ref Range Status   Specimen Description  Final    PLEURAL Performed at [facility], [address] [place], [place] [address]    Special Requests   Final    NONE Performed at [facility], [address] [place], [place] [address]    Gram Stain    Final    RARE WBC PRESENT, PREDOMINANTLY MONONUCLEAR NO ORGANISMS SEEN    Culture   Final    NO GROWTH < 12 HOURS Performed at [facility], [address], [place], [place] [address]    Report Status PENDING  Incomplete    RADIOLOGY:  DG Chest Port 1 View  Result Date: 04/24/2020 CLINICAL DATA:  Left thoracentesis. EXAM: PORTABLE CHEST 1 VIEW COMPARISON:  Chest x-ray 04/23/2020. FINDINGS: Cardiomegaly and pulmonary venous congestion again noted. Bilateral interstitial prominence noted. Low lung volumes with bibasilar atelectasis. Small left pleural effusion, improved from prior exam. No evidence of pneumothorax post thoracentesis. IMPRESSION: 1. No evidence of pneumothorax post thoracentesis. 2. Cardiomegaly with pulmonary venous congestion and bilateral interstitial prominence again noted. Small left pleural effusion, improved from prior exam. Electronically Signed   By: Maisie Fus  Register   On: 04/24/2020 12:29   DG Chest Port 1 View  Result Date: 04/23/2020 CLINICAL DATA:  Status post thoracentesis EXAM: PORTABLE CHEST 1 VIEW COMPARISON:  April 21, 2020 FINDINGS: No pneumothorax. There are fairly small pleural effusions bilaterally with atelectasis and consolidation in the left lower lobe. Heart is mildly enlarged with pulmonary vascularity normal. No adenopathy. No bone lesions. IMPRESSION: No pneumothorax. Fairly small pleural effusions bilaterally with atelectasis and questionable superimposed pneumonia left lower lobe. Stable cardiac silhouette. Electronically Signed   By: Bretta Bang III M.D.   On: 04/23/2020 12:05   US THORACENTESIS ASP PLEURAL SPACE W/IMG GUIDE  Result Date: 04/23/2020 INDICATION: Patient with history chronic respiratory failure, AFib, CHF with shortness of breath. Found to have pleural effusion. Request is for therapeutic and diagnostic thoracentesis. EXAM: ULTRASOUND GUIDED THERAPEUTIC AND DIAGNOSTIC THORACENTESIS MEDICATIONS: Lidocaine 1% 10 mL  COMPLICATIONS: None immediate. PROCEDURE: An ultrasound guided thoracentesis was thoroughly discussed with the patient and questions answered. The benefits, risks, alternatives and complications were also discussed. The patient understands and wishes to proceed with the procedure. Written consent was obtained. Ultrasound was performed to localize and mark an adequate pocket of fluid in the right chest. The area was then prepped and draped in the normal sterile fashion. 1% Lidocaine was used for local anesthesia. Under ultrasound guidance a 6 Fr Safe-T-Centesis catheter was introduced. Thoracentesis was performed. The catheter was removed and a dressing applied. FINDINGS: A total of approximately 1 L of straw-colored fluid was removed. Samples were sent to the laboratory as requested by the clinical team. IMPRESSION: Successful ultrasound guided therapeutic and diagnostic right sided thoracentesis yielding 1 L of pleural fluid. Read by: Anders Grant, NP Electronically Signed   By: Simonne Come M.D.   On: 04/23/2020 12:12     Management plans discussed with the patient, family and they are in agreement.  CODE STATUS:     Code Status Orders  (From admission, onward)         Start     Ordered   04/18/20 1033  Full code  Continuous        04/18/20 1032        Code Status History    Date Active Date Inactive Code Status Order ID Comments User Context   04/18/2020 0920 04/18/2020 1032 Full Code [id number]  Lorretta Harp, MD ED  04/18/2020 0915 04/18/2020 0920 DNR [id number]  Lorretta HarpNiu, Xilin, MD ED   04/18/2020 0908 04/18/2020 0915 DNR [id number]  Lorretta HarpNiu, Xilin, MD ED   04/09/2020 0203 04/13/2020 2042 Full Code [id number]  Mansy, Vernetta HoneyJan A, MD ED   03/29/2020 0404 04/03/2020 1844 Full Code [id number]  Andris Baumannuncan, Hazel V, MD ED   03/11/2020 2235 03/21/2020 0011 DNR [id number]  Anselm Junglingu, Ching T, DO ED   12/04/2019 1919 12/07/2019 1936 Full Code [id number]  Lorretta HarpNiu, Xilin, MD Inpatient   03/17/2019 2019 03/20/2019 1956 Full Code  [id number]  Campbell StallMayo, Katy Dodd, MD Inpatient   07/05/2017 0148 07/07/2017 1630 Full Code [id number]  Ihor AustinPyreddy, Pavan, MD Inpatient   Advance Care Planning Activity    Advance Directive Documentation     Most Recent Value  Type of Advance Directive Healthcare Power of Attorney  Pre-existing out of facility DNR order (yellow form or pink MOST form) --  "MOST" Form in Place? --      TOTAL TIME TAKING CARE OF THIS PATIENT: 35 minutes.    Alford Highlandichard Khamari Yousuf M.D on 04/24/2020 at 1:06 PM  Between 7am to 6pm - Pager - [phone]  After 6pm go to [email or web address] - password [email or web address]  [facility]  CC: Primary care physician; Mickel FuchsWroth, Thomas H, MD

## 2020-04-24 NOTE — Discharge Instructions (Signed)
Pleural Effusion °Pleural effusion is an abnormal buildup of fluid in the layers of tissue between the lungs and the inside of the chest (pleural space) The two layers of tissue that line the lungs and the inside of the chest are called pleura. Usually, there is no air in the space between the pleura, only a thin layer of fluid. Some conditions can cause a large amount of fluid to build up, which can cause the lung to collapse if untreated. A pleural effusion is usually caused by another disease that requires treatment. °What are the causes? °Pleural effusion can be caused by: °· Heart failure. °· Certain infections, such as pneumonia or tuberculosis. °· Cancer. °· A blood clot in the lung (pulmonary embolism). °· Complications from surgery, such as from open heart surgery. °· Liver disease (cirrhosis). °· Kidney disease. °What are the signs or symptoms? °In some cases, pleural effusion may cause no symptoms. If symptoms are present, they may include: °· Shortness of breath, especially when lying down. °· Chest pain. This may get worse when taking a deep breath. °· Fever. °· Dry, long-lasting (chronic) cough. °· Hiccups. °· Rapid breathing. °An underlying condition that is causing the pleural effusion (such as heart failure, pneumonia, blood clots, tuberculosis, or cancer) may also cause other symptoms. °How is this diagnosed? °This condition may be diagnosed based on: °· Your symptoms and medical history. °· A physical exam. °· A chest X-ray. °· A procedure to use a needle to remove fluid from the pleural space (thoracentesis). This fluid is tested. °· Other imaging studies of the chest, such as ultrasound or CT scan. °How is this treated? °Depending on the cause of your condition, treatment may include: °· Treating the underlying condition that is causing the effusion. When that condition improves, the effusion will also improve. Examples of treatment for underlying conditions include: °? Antibiotic medicines to  treat an infection. °? Diuretics or other heart medicines to treat heart failure. °· Thoracentesis. °· Placing a thin flexible tube under your skin and into your chest to continuously drain the effusion (indwelling pleural catheter). °· Surgery to remove the outer layer of tissue from the pleural space (decortication). °· A procedure to put medicine into the chest cavity to seal the pleural space and prevent fluid buildup (pleurodesis). °· Chemotherapy and radiation therapy, if you have cancerous (malignant) pleural effusion. These treatments are typically used to treat cancer. They kill certain cells in the body. °Follow these instructions at home: °· Take over-the-counter and prescription medicines only as told by your health care provider. °· Ask your health care provider what activities are safe for you. °· Keep track of how long you are able to do mild exercise (such as walking) before you get short of breath. Write down this information to share with your health care provider. Your ability to exercise should improve over time. °· Do not use any products that contain nicotine or tobacco, such as cigarettes and e-cigarettes. If you need help quitting, ask your health care provider. °· Keep all follow-up visits as told by your health care provider. This is important. °Contact a health care provider if: °· The amount of time that you are able to do mild exercise: °? Decreases. °? Does not improve with time. °· You have a fever. °Get help right away if: °· You are short of breath. °· You develop chest pain. °· You develop a new cough. °Summary °· Pleural effusion is an abnormal buildup of fluid in the layers   of tissue between the lungs and the inside of the chest.  Pleural effusion can have many causes, including heart failure, pulmonary embolism, infections, or cancer.  Symptoms of pleural effusion can include shortness of breath, chest pain, fever, long-lasting (chronic) cough, hiccups, or rapid  breathing.  Diagnosis often involves making images of the chest (such as with ultrasound or X-ray) and removing fluid (thoracentesis) to send for testing.  Treatment for pleural effusion depends on what underlying condition is causing it. This information is not intended to replace advice given to you by your health care provider. Make sure you discuss any questions you have with your health care provider. Document Revised: 05/01/2017 Document Reviewed: 01/22/2017 Elsevier Patient Education  2020 [employer]   Hyponatremia Hyponatremia is when the amount of salt (sodium) in your blood is too low. When salt levels are low, your body may take in extra water. This can cause swelling throughout the body. The swelling often affects the brain. What are the causes? This condition may be caused by:  Certain medical problems or conditions.  Vomiting a lot.  Having watery poop (diarrhea) often.  Certain medicines or illegal drugs.  Not having enough water in the body (dehydration).  Drinking too much water.  Eating a diet that is low in salt.  Large burns on your body.  Too much sweating. What increases the risk? You are more likely to get this condition if you:  Have long-term (chronic) kidney disease.  Have heart failure.  Have a medical condition that causes you to have watery poop often.  Do very hard exercises.  Take medicines that affect the amount of salt is in your blood. What are the signs or symptoms? Symptoms of this condition include:  Headache.  Feeling like you may vomit (nausea).  Vomiting.  Being very tired (lethargic).  Muscle weakness and cramps.  Not wanting to eat as much as normal (loss of appetite).  Feeling weak or light-headed. Severe symptoms of this condition include:  Confusion.  Feeling restless (agitation).  Having a fast heart rate.  Passing out (fainting).  Seizures.  Coma. How is this treated? Treatment for this  condition depends on the cause. Treatment may include:  Getting fluids through an IV tube that is put into one of your veins.  Taking medicines to fix the salt levels in your blood. If medicines are causing the problem, your medicines will need to be changed.  Limiting how much water or fluid you take in.  Monitoring in the hospital to watch your symptoms. Follow these instructions at home:   Take over-the-counter and prescription medicines only as told by your doctor. Many medicines can make this condition worse. Talk with your doctor about any medicines that you are taking.  Eat and drink exactly as you are told by your doctor. ? Eat only the foods you are told to eat. ? Limit how much fluid you take.  Do not drink alcohol.  Keep all follow-up visits as told by your doctor. This is important. Contact a doctor if:  You feel more like you may vomit.  You feel more tired.  Your headache gets worse.  You feel more confused.  You feel weaker.  Your symptoms go away and then they come back.  You have trouble following the diet instructions. Get help right away if:  You have a seizure.  You pass out.  You keep having watery poop.  You keep vomiting. Summary  Hyponatremia is when the amount of  salt in your blood is too low.  When salt levels are low, you can have swelling throughout the body. The swelling mostly affects the brain.  Treatment depends on the cause. Treatment may include getting IV fluids, medicines, or not drinking as much fluid. This information is not intended to replace advice given to you by your health care provider. Make sure you discuss any questions you have with your health care provider. Document Revised: 08/05/2018 Document Reviewed: 04/22/2018 Elsevier Patient Education  2020 [employer].

## 2020-04-24 NOTE — Progress Notes (Signed)
Physical Therapy Treatment Patient Details Name: [name] MRN: [id number] DOB: 08-11-1945 Today's Date: 04/24/2020    History of Present Illness presented to ER from STR secondary to SOB, LE edema; admitted for management of chronic respiratory failure due to CHF.  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    PT Comments    Pt seen bedside today. Presentlly on 2.5L O2 [place], sats at 94%, HR 94-110bpm, no distress noted.  Pt declined out of bed activity due to awaiting another thoracentesis to remove additional fluid. Discussed POC with pt and reviewed strengthening exercises to maintain strength and facilitate circulation.     Follow Up Recommendations  SNF     Equipment Recommendations  None recommended by PT    Recommendations for Other Services OT consult     Precautions / Restrictions Precautions Precautions: Fall Precaution Comments: R quadrant ostomy; laparotomy abdominal wound    Mobility  Bed Mobility                  Transfers                    Ambulation/Gait                 Stairs             Wheelchair Mobility    Modified Rankin (Stroke Patients Only)       Balance                                            Cognition Arousal/Alertness: Awake/alert Behavior During Therapy: WFL for tasks assessed/performed Overall Cognitive Status: Within Functional Limits for tasks assessed                                 General Comments: Slowed processing      Exercises General Exercises - Lower Extremity Ankle Circles/Pumps: Both;AROM;15 reps;Supine Heel Slides: Both;AROM;10 reps Hip ABduction/ADduction: Both;AROM;10 reps    General Comments General comments (skin integrity, edema, etc.): Pt remains on 2-3L O2 sats at 94%, HR 107 and irregular  at rest.      Pertinent Vitals/Pain      Home Living                      Prior Function            PT Goals (current goals can now be found in the care plan section) Acute Rehab PT Goals Patient Stated Goal: to breath better    Frequency    Min 2X/week      PT Plan Current plan remains appropriate    Co-evaluation              AM-PAC PT "6 Clicks" Mobility   Outcome Measure  Help needed turning from your back to your side while in a flat bed without using bedrails?: None Help needed moving from lying on your back to sitting on the side of a flat bed without using bedrails?: A Little Help needed moving to and from a bed to a chair (including a wheelchair)?: A Little Help needed standing up from a chair using your arms (e.g., wheelchair or bedside chair)?: A Little Help  needed to walk in hospital room?: A Little Help needed climbing 3-5 steps with a railing? : A Lot 6 Click Score: 18    End of Session         PT Visit Diagnosis: Unsteadiness on feet (R26.81);Muscle weakness (generalized) (M62.81)     Time: 8832-5498 PT Time Calculation (min) (ACUTE ONLY): 16 min  Charges:  $Therapeutic Exercise: 8-22 mins                     Zadie Cleverly, PTA   Jannet Askew 04/24/2020, 12:38 PM

## 2020-04-24 NOTE — Procedures (Signed)
Ultrasound-guided diagnostic and therapeutic left thoracentesis performed yielding 750 cc of yellow fluid. No immediate complications. Follow-up chest x-ray pending.EBL none. The fluid was sent to the lab for preordered studies.

## 2020-04-25 LAB — PH, BODY FLUID: pH, Body Fluid: 7.6

## 2020-04-25 LAB — CYTOLOGY - NON PAP

## 2020-04-25 LAB — PROTEIN, BODY FLUID (OTHER): Total Protein, Body Fluid Other: 1.7 g/dL

## 2020-04-27 LAB — BODY FLUID CULTURE: Culture: NO GROWTH

## 2020-04-28 LAB — BODY FLUID CULTURE: Culture: NO GROWTH

## 2020-05-01 NOTE — Progress Notes (Signed)
Patient ID: [name], female    DOB: May 25, 1946, 73 y.o.   MRN: [id number]  HPI  [name] is a 74 y/o female with a history of HTN, stroke, anemia, seizures, atrial fibrillation and heart failure.   Echo report from 12/06/19 reviewed and showed an EF of 55-60% along with mildly elevated PA pressure, moderate LAE and mild MR. Echo report from 03/18/2019 reviewed and showed an EF of 60-65% along with moderate/severe MR, trivial TR and moderately elevated PA pressure.   Admitted 04/18/20 due to acute on chronic HF along with hyponatremia. Nephrology and wound consults obtained. Diuretic continued along with salt tablets. Thoracentesis on 04/23/2020 drew off 1 L underneath the right lung. Thoracentesis on 04/24/2020 drew off 750 mL underneath the left lung. Discharged after 6 days. Admitted 04/08/20 due to acute on chronic HF. Cardiology and wound consults obtained. O2 sats were in the 80's so she was placed on supplemental oxygen. Was in AF. Initially given IV lasix with transition to oral diuretics. Discharged after 5 days.   She presents today for a follow-up visit with a chief complaint of minimal shortness of breath upon moderate exertion. She describes this as chronic in nature having been present for several years. She has associated fatigue, pedal edema and easy bruising along with this. She denies any difficulty sleeping, dizziness, abdominal distention, palpitations, chest pain, cough or weight gain.                  Past Medical History:  Diagnosis Date  . Acute respiratory failure (HCC)    Secondary to aspiration pneumonia   . Altered mental status    Secondary to viral herpes simplex virus encephalitis  . Anemia   . Atrial fibrillation (HCC)   . Bilateral pneumonia   . CHF (congestive heart failure) (HCC)   . Dysphasia   . Encephalitis   . Hyperglycemia   . Hypertension   . Hyponatremia   . IBS (irritable bowel syndrome)   . Seizures (HCC)   . Stroke [facility])    Past  Surgical History:  Procedure Laterality Date  . CHOLECYSTECTOMY    . COLECTOMY WITH COLOSTOMY CREATION/HARTMANN PROCEDURE N/A 03/12/2020   Procedure: COLECTOMY WITH COLOSTOMY CREATION/HARTMANN PROCEDURE;  Surgeon: Campbell Lerner, MD;  Location: [facility] ORS;  Service: General;  Laterality: N/A;  . COLONOSCOPY N/A 01/17/2020   Procedure: COLONOSCOPY;  Surgeon: Regis Bill, MD;  Location: [facility] ENDOSCOPY;  Service: Endoscopy;  Laterality: N/A;  . COLONOSCOPY WITH PROPOFOL N/A 12/31/2016   Procedure: COLONOSCOPY WITH PROPOFOL;  Surgeon: Scot Jun, MD;  Location: [facility] ENDOSCOPY;  Service: Endoscopy;  Laterality: N/A;  . ESOPHAGOGASTRODUODENOSCOPY (EGD) WITH PROPOFOL N/A 12/31/2016   Procedure: ESOPHAGOGASTRODUODENOSCOPY (EGD) WITH PROPOFOL;  Surgeon: Scot Jun, MD;  Location: [facility] ENDOSCOPY;  Service: Endoscopy;  Laterality: N/A;  . LAPAROTOMY N/A 03/12/2020   Procedure: EXPLORATORY LAPAROTOMY;  Surgeon: Campbell Lerner, MD;  Location: [facility] ORS;  Service: General;  Laterality: N/A;  . TONSILLECTOMY    . TUBAL LIGATION     Family History  Problem Relation Age of Onset  . Prostate cancer Father   . Breast cancer Maternal Aunt   . Breast cancer Maternal Aunt   . Cerebral palsy Son    Social History   Tobacco Use  . Smoking status: Never Smoker  . Smokeless tobacco: Never Used  Substance Use Topics  . Alcohol use: No   Allergies  Allergen Reactions  . Prednisone Hives  . Predicort [Prednisolone Acetate]  HIVES,   Prior to Admission medications   Medication Sig Start Date End Date Taking? Authorizing Provider  acetaminophen (TYLENOL) 325 MG tablet Take 650 mg by mouth every 6 (six) hours as needed.   Yes [provider]  amiodarone (PACERONE) 200 MG tablet Take 1 tablet (200 mg total) by mouth daily. 03/20/20  Yes Darlin Priestly, MD  apixaban (ELIQUIS) 5 MG TABS tablet Take 1 tablet (5 mg total) by mouth 2 (two) times daily. 03/20/19  Yes Altamese Dilling, MD  Ensure Max Protein (ENSURE MAX PROTEIN) LIQD Take 330 mLs (11 oz total) by mouth 2 (two) times daily. 03/20/20  Yes Darlin Priestly, MD  folic acid (FOLVITE) 1 MG tablet Take 1 mg by mouth daily. Per tube once daily     Yes [provider]  furosemide (LASIX) 20 MG tablet Take 1 tablet (20 mg total) by mouth daily. 04/24/20  Yes Wieting, Richard, MD  hydrocerin (EUCERIN) CREA Apply 1 application topically 2 (two) times daily. 04/03/20  Yes Alford Highland, MD  levothyroxine (SYNTHROID) 75 MCG tablet Take 75 mcg by mouth daily before breakfast.    Yes [provider]  metoprolol succinate (TOPROL-XL) 50 MG 24 hr tablet Take 1 tablet (50 mg total) by mouth 2 (two) times daily. Take with or immediately following a meal. 04/03/20  Yes Wieting, Richard, MD  midodrine (PROAMATINE) 2.5 MG tablet Take 1 tablet (2.5 mg total) by mouth 3 (three) times daily with meals. 04/03/20  Yes Wieting, Richard, MD  Multiple Vitamin (MULTIVITAMIN WITH MINERALS) TABS tablet Take 1 tablet by mouth daily. 03/20/20  Yes Darlin Priestly, MD  omeprazole (PRILOSEC) 20 MG capsule Take 20 mg by mouth daily.   Yes [provider]  potassium chloride (KLOR-CON) 10 MEQ tablet Take 10 mEq by mouth daily.   Yes [provider]  sodium chloride (OCEAN) 0.65 % SOLN nasal spray Place 2 sprays into both nostrils every 2 (two) hours as needed for congestion. 04/24/20  Yes Wieting, Richard, MD  sodium chloride 1 g tablet Take 1 tablet (1 g total) by mouth 2 (two) times daily with a meal. 04/24/20  Yes Alford Highland, MD    Review of Systems  Constitutional: Positive for fatigue. Negative for appetite change.  HENT: Positive for rhinorrhea. Negative for congestion and sore throat.        Dry mouth  Eyes: Negative.   Respiratory: Positive for shortness of breath (with moderate exertion; improving). Negative for cough.   Cardiovascular: Positive for leg swelling. Negative for chest pain and  palpitations.  Gastrointestinal: Negative for abdominal distention and abdominal pain.  Endocrine: Negative.   Genitourinary: Negative.   Musculoskeletal: Negative for back pain and neck pain.  Skin: Negative.   Allergic/Immunologic: Negative.   Neurological: Negative for dizziness and light-headedness.  Hematological: Negative for adenopathy. Bruises/bleeds easily.  Psychiatric/Behavioral: Negative for dysphoric mood and sleep disturbance (sleeping on 2 pillows). The patient is not nervous/anxious.    Vitals:   05/02/20 1223  BP: (!) 145/92  Pulse: (!) 123  Resp: 16  SpO2: 96%  Weight: 151 lb (68.5 kg)  Height: 5\' 5"  (1.651 m)   Wt Readings from Last 3 Encounters:  05/02/20 151 lb (68.5 kg)  04/24/20 148 lb 14.4 oz (67.5 kg)  04/13/20 141 lb 3.2 oz (64 kg)   Lab Results  Component Value Date   CREATININE 0.85 04/24/2020   CREATININE 0.74 04/22/2020   CREATININE 0.64 04/21/2020    Physical Exam Vitals and nursing  note reviewed.  Constitutional:      Appearance: Normal appearance.  HENT:     Head: Normocephalic and atraumatic.     Mouth/Throat:     Mouth: Mucous membranes are dry.  Cardiovascular:     Rate and Rhythm: Normal rate. Rhythm irregular.     Heart sounds: Murmur (2/6) heard.   Pulmonary:     Effort: Pulmonary effort is normal. No respiratory distress.     Breath sounds: No wheezing or rales.  Abdominal:     General: There is no distension.     Palpations: Abdomen is soft.     Tenderness: There is no abdominal tenderness.  Musculoskeletal:        General: No tenderness.     Cervical back: Normal range of motion and neck supple.     Right lower leg: Edema (2+ pitting) present.     Left lower leg: Edema (2+ pitting) present.  Skin:    General: Skin is warm and dry.     Findings: Bruising (on arms) present.  Neurological:     General: No focal deficit present.     Mental Status: She is alert and oriented to person, place, and time.  Psychiatric:         Mood and Affect: Mood normal.        Behavior: Behavior normal.        Thought Content: Thought content normal.    Assessment & Plan:  1: Chronic heart failure with preserved ejection fraction with structural changes (LAE)-  - NYHA class II - euvolemic today - weighing daily at [facility]; reminded to call for an overnight weight gain of >2 pounds or a weekly weight gain of >5 pounds - not adding salt although is currently at [facility] and says that the food doesn't taste salty.  - saw cardiology Mellissa Kohut) 04/25/20 - says that she's receiving both PT & OT - BNP 04/18/20 was 884.9 - reports receiving both her COVID vaccines - order written for her to rinse/ spit after using nebulizer and for facility provider to assess her mouth dryness - sodium level will limit diuretic titration  2: HTN- - BP mildly elevated today - normally follows with PCP (Wroth @ [facility]) but is currently seeing PCP at facility - [facility] 04/24/20 reviewed and showed sodium 127, potassium 4.6, creatinine 0.85 and GFR >60  3: Paroxymal atrial fibrillation- - on apixaban, amiodarone & metoprolol succinate  4: Lymphedema- - stage 2 - currently receiving PT/OT for exercise along with doing some walking - encouraged to elevate her legs when sitting for long periods of time - order written for facility to put TED socks on daily with removal at bedtime - consider lymphapress compression boots if edema persists   Facility did not send medication list with patient and she's not sure of what she's taking.   Return in 3 months or sooner for any questions/problems before then.

## 2020-05-02 ENCOUNTER — Ambulatory Visit: Payer: Medicare Other | Admitting: Family

## 2020-05-02 ENCOUNTER — Other Ambulatory Visit: Payer: Self-pay

## 2020-05-02 ENCOUNTER — Encounter: Payer: Self-pay | Admitting: Family

## 2020-05-02 VITALS — BP 145/92 | HR 123 | Resp 16 | Ht 65.0 in | Wt 151.0 lb

## 2020-05-02 DIAGNOSIS — I5032 Chronic diastolic (congestive) heart failure: Secondary | ICD-10-CM | POA: Insufficient documentation

## 2020-05-02 DIAGNOSIS — Z7901 Long term (current) use of anticoagulants: Secondary | ICD-10-CM | POA: Insufficient documentation

## 2020-05-02 DIAGNOSIS — R0602 Shortness of breath: Secondary | ICD-10-CM | POA: Diagnosis not present

## 2020-05-02 DIAGNOSIS — I480 Paroxysmal atrial fibrillation: Secondary | ICD-10-CM | POA: Insufficient documentation

## 2020-05-02 DIAGNOSIS — I4891 Unspecified atrial fibrillation: Secondary | ICD-10-CM | POA: Insufficient documentation

## 2020-05-02 DIAGNOSIS — Z79899 Other long term (current) drug therapy: Secondary | ICD-10-CM | POA: Insufficient documentation

## 2020-05-02 DIAGNOSIS — I890 Lymphedema, not elsewhere classified: Secondary | ICD-10-CM | POA: Insufficient documentation

## 2020-05-02 DIAGNOSIS — J189 Pneumonia, unspecified organism: Secondary | ICD-10-CM | POA: Diagnosis not present

## 2020-05-02 DIAGNOSIS — Z8673 Personal history of transient ischemic attack (TIA), and cerebral infarction without residual deficits: Secondary | ICD-10-CM | POA: Insufficient documentation

## 2020-05-02 DIAGNOSIS — I110 Hypertensive heart disease with heart failure: Secondary | ICD-10-CM | POA: Insufficient documentation

## 2020-05-02 DIAGNOSIS — J869 Pyothorax without fistula: Secondary | ICD-10-CM | POA: Diagnosis not present

## 2020-05-02 DIAGNOSIS — I10 Essential (primary) hypertension: Secondary | ICD-10-CM

## 2020-05-02 NOTE — Patient Instructions (Signed)
Continue weighing daily and call for an overnight weight gain of > 2 pounds or a weekly weight gain of >5 pounds. 

## 2020-05-03 ENCOUNTER — Encounter: Payer: Self-pay | Admitting: Emergency Medicine

## 2020-05-03 ENCOUNTER — Emergency Department: Payer: Medicare Other

## 2020-05-03 ENCOUNTER — Other Ambulatory Visit: Payer: Self-pay

## 2020-05-03 ENCOUNTER — Inpatient Hospital Stay: Payer: Medicare Other | Attending: Oncology

## 2020-05-03 ENCOUNTER — Inpatient Hospital Stay
Admission: EM | Admit: 2020-05-03 | Discharge: 2020-06-02 | DRG: 193 | Disposition: E | Payer: Medicare Other | Attending: Pulmonary Disease | Admitting: Pulmonary Disease

## 2020-05-03 DIAGNOSIS — E162 Hypoglycemia, unspecified: Secondary | ICD-10-CM | POA: Diagnosis present

## 2020-05-03 DIAGNOSIS — I110 Hypertensive heart disease with heart failure: Secondary | ICD-10-CM | POA: Diagnosis present

## 2020-05-03 DIAGNOSIS — Z7901 Long term (current) use of anticoagulants: Secondary | ICD-10-CM

## 2020-05-03 DIAGNOSIS — I272 Pulmonary hypertension, unspecified: Secondary | ICD-10-CM | POA: Diagnosis present

## 2020-05-03 DIAGNOSIS — Z66 Do not resuscitate: Secondary | ICD-10-CM | POA: Diagnosis not present

## 2020-05-03 DIAGNOSIS — Z8661 Personal history of infections of the central nervous system: Secondary | ICD-10-CM

## 2020-05-03 DIAGNOSIS — I5082 Biventricular heart failure: Secondary | ICD-10-CM | POA: Diagnosis present

## 2020-05-03 DIAGNOSIS — Z9689 Presence of other specified functional implants: Secondary | ICD-10-CM

## 2020-05-03 DIAGNOSIS — I1310 Hypertensive heart and chronic kidney disease without heart failure, with stage 1 through stage 4 chronic kidney disease, or unspecified chronic kidney disease: Secondary | ICD-10-CM | POA: Diagnosis present

## 2020-05-03 DIAGNOSIS — R627 Adult failure to thrive: Secondary | ICD-10-CM | POA: Diagnosis present

## 2020-05-03 DIAGNOSIS — Z7189 Other specified counseling: Secondary | ICD-10-CM | POA: Diagnosis not present

## 2020-05-03 DIAGNOSIS — R570 Cardiogenic shock: Secondary | ICD-10-CM | POA: Diagnosis present

## 2020-05-03 DIAGNOSIS — R0602 Shortness of breath: Secondary | ICD-10-CM | POA: Diagnosis present

## 2020-05-03 DIAGNOSIS — J189 Pneumonia, unspecified organism: Principal | ICD-10-CM | POA: Insufficient documentation

## 2020-05-03 DIAGNOSIS — I509 Heart failure, unspecified: Secondary | ICD-10-CM

## 2020-05-03 DIAGNOSIS — N170 Acute kidney failure with tubular necrosis: Secondary | ICD-10-CM | POA: Diagnosis present

## 2020-05-03 DIAGNOSIS — I081 Rheumatic disorders of both mitral and tricuspid valves: Secondary | ICD-10-CM | POA: Diagnosis present

## 2020-05-03 DIAGNOSIS — Z515 Encounter for palliative care: Secondary | ICD-10-CM

## 2020-05-03 DIAGNOSIS — I482 Chronic atrial fibrillation, unspecified: Secondary | ICD-10-CM | POA: Diagnosis present

## 2020-05-03 DIAGNOSIS — E872 Acidosis, unspecified: Secondary | ICD-10-CM | POA: Diagnosis present

## 2020-05-03 DIAGNOSIS — E871 Hypo-osmolality and hyponatremia: Secondary | ICD-10-CM | POA: Diagnosis present

## 2020-05-03 DIAGNOSIS — Z8042 Family history of malignant neoplasm of prostate: Secondary | ICD-10-CM

## 2020-05-03 DIAGNOSIS — R569 Unspecified convulsions: Secondary | ICD-10-CM | POA: Diagnosis present

## 2020-05-03 DIAGNOSIS — Z79890 Hormone replacement therapy (postmenopausal): Secondary | ICD-10-CM

## 2020-05-03 DIAGNOSIS — Z9981 Dependence on supplemental oxygen: Secondary | ICD-10-CM

## 2020-05-03 DIAGNOSIS — E039 Hypothyroidism, unspecified: Secondary | ICD-10-CM | POA: Diagnosis present

## 2020-05-03 DIAGNOSIS — J90 Pleural effusion, not elsewhere classified: Secondary | ICD-10-CM | POA: Diagnosis present

## 2020-05-03 DIAGNOSIS — J9610 Chronic respiratory failure, unspecified whether with hypoxia or hypercapnia: Secondary | ICD-10-CM | POA: Diagnosis present

## 2020-05-03 DIAGNOSIS — Z20822 Contact with and (suspected) exposure to covid-19: Secondary | ICD-10-CM | POA: Diagnosis present

## 2020-05-03 DIAGNOSIS — G9340 Encephalopathy, unspecified: Secondary | ICD-10-CM | POA: Diagnosis present

## 2020-05-03 DIAGNOSIS — N179 Acute kidney failure, unspecified: Secondary | ICD-10-CM | POA: Diagnosis present

## 2020-05-03 DIAGNOSIS — Z8673 Personal history of transient ischemic attack (TIA), and cerebral infarction without residual deficits: Secondary | ICD-10-CM

## 2020-05-03 DIAGNOSIS — R0600 Dyspnea, unspecified: Secondary | ICD-10-CM

## 2020-05-03 DIAGNOSIS — R188 Other ascites: Secondary | ICD-10-CM | POA: Diagnosis present

## 2020-05-03 DIAGNOSIS — Z79899 Other long term (current) drug therapy: Secondary | ICD-10-CM

## 2020-05-03 DIAGNOSIS — J869 Pyothorax without fistula: Secondary | ICD-10-CM | POA: Diagnosis present

## 2020-05-03 DIAGNOSIS — Z803 Family history of malignant neoplasm of breast: Secondary | ICD-10-CM

## 2020-05-03 DIAGNOSIS — R4182 Altered mental status, unspecified: Secondary | ICD-10-CM

## 2020-05-03 DIAGNOSIS — I5032 Chronic diastolic (congestive) heart failure: Secondary | ICD-10-CM | POA: Diagnosis present

## 2020-05-03 DIAGNOSIS — K219 Gastro-esophageal reflux disease without esophagitis: Secondary | ICD-10-CM | POA: Diagnosis present

## 2020-05-03 DIAGNOSIS — D689 Coagulation defect, unspecified: Secondary | ICD-10-CM | POA: Diagnosis present

## 2020-05-03 DIAGNOSIS — K589 Irritable bowel syndrome without diarrhea: Secondary | ICD-10-CM | POA: Diagnosis present

## 2020-05-03 LAB — CBG MONITORING, ED
Glucose-Capillary: 159 mg/dL — ABNORMAL HIGH (ref 70–99)
Glucose-Capillary: 281 mg/dL — ABNORMAL HIGH (ref 70–99)
Glucose-Capillary: <10 mg/dL — CL (ref 70–99)
Glucose-Capillary: 161 mg/dL — ABNORMAL HIGH (ref 70–99)
Glucose-Capillary: 147 mg/dL — ABNORMAL HIGH (ref 70–99)

## 2020-05-03 LAB — CBC WITH DIFFERENTIAL/PLATELET
Abs Immature Granulocytes: 0.13 10*3/uL — ABNORMAL HIGH (ref 0.00–0.07)
Abs Immature Granulocytes: 0.23 10*3/uL — ABNORMAL HIGH (ref 0.00–0.07)
Basophils Absolute: 0 10*3/uL (ref 0.0–0.1)
Basophils Absolute: 0 10*3/uL (ref 0.0–0.1)
Basophils Relative: 0 %
Basophils Relative: 0 %
Eosinophils Absolute: 0 10*3/uL (ref 0.0–0.5)
Eosinophils Absolute: 0 10*3/uL (ref 0.0–0.5)
Eosinophils Relative: 0 %
Eosinophils Relative: 0 %
HCT: 37.7 % (ref 36.0–46.0)
HCT: 40.6 % (ref 36.0–46.0)
Hemoglobin: 11.6 g/dL — ABNORMAL LOW (ref 12.0–15.0)
Hemoglobin: 11.7 g/dL — ABNORMAL LOW (ref 12.0–15.0)
Immature Granulocytes: 1 %
Immature Granulocytes: 2 %
Lymphocytes Relative: 11 %
Lymphocytes Relative: 8 %
Lymphs Abs: 1.1 10*3/uL (ref 0.7–4.0)
Lymphs Abs: 1.1 10*3/uL (ref 0.7–4.0)
MCH: 24.4 pg — ABNORMAL LOW (ref 26.0–34.0)
MCH: 23.9 pg — ABNORMAL LOW (ref 26.0–34.0)
MCHC: 30.8 g/dL (ref 30.0–36.0)
MCHC: 28.8 g/dL — ABNORMAL LOW (ref 30.0–36.0)
MCV: 79.2 fL — ABNORMAL LOW (ref 80.0–100.0)
MCV: 83 fL (ref 80.0–100.0)
Monocytes Absolute: 0.5 10*3/uL (ref 0.1–1.0)
Monocytes Absolute: 0.7 10*3/uL (ref 0.1–1.0)
Monocytes Relative: 5 %
Monocytes Relative: 5 %
Neutro Abs: 8.2 10*3/uL — ABNORMAL HIGH (ref 1.7–7.7)
Neutro Abs: 10.8 10*3/uL — ABNORMAL HIGH (ref 1.7–7.7)
Neutrophils Relative %: 83 %
Neutrophils Relative %: 85 %
Platelets: 253 10*3/uL (ref 150–400)
Platelets: 276 10*3/uL (ref 150–400)
RBC: 4.76 MIL/uL (ref 3.87–5.11)
RBC: 4.89 MIL/uL (ref 3.87–5.11)
RDW: 16.8 % — ABNORMAL HIGH (ref 11.5–15.5)
RDW: 17.3 % — ABNORMAL HIGH (ref 11.5–15.5)
WBC: 10 10*3/uL (ref 4.0–10.5)
WBC: 12.8 10*3/uL — ABNORMAL HIGH (ref 4.0–10.5)
nRBC: 0.7 % — ABNORMAL HIGH (ref 0.0–0.2)
nRBC: 0.9 % — ABNORMAL HIGH (ref 0.0–0.2)

## 2020-05-03 LAB — BASIC METABOLIC PANEL
Anion gap: 21 — ABNORMAL HIGH (ref 5–15)
BUN: 28 mg/dL — ABNORMAL HIGH (ref 8–23)
CO2: 10 mmol/L — ABNORMAL LOW (ref 22–32)
Calcium: 9 mg/dL (ref 8.9–10.3)
Chloride: 101 mmol/L (ref 98–111)
Creatinine, Ser: 1.09 mg/dL — ABNORMAL HIGH (ref 0.44–1.00)
GFR, Estimated: 53 mL/min — ABNORMAL LOW (ref >60)
Glucose, Bld: <20 mg/dL — CL (ref 70–99)
Potassium: 5.5 mmol/L — ABNORMAL HIGH (ref 3.5–5.1)
Sodium: 132 mmol/L — ABNORMAL LOW (ref 135–145)

## 2020-05-03 LAB — TROPONIN I (HIGH SENSITIVITY)
Troponin I (High Sensitivity): 12 ng/L (ref <18)
Troponin I (High Sensitivity): 12 ng/L (ref <18)
Troponin I (High Sensitivity): 13 ng/L (ref <18)
Troponin I (High Sensitivity): 14 ng/L (ref <18)

## 2020-05-03 LAB — PROTIME-INR
INR: 5.4 (ref 0.8–1.2)
Prothrombin Time: 47.8 seconds — ABNORMAL HIGH (ref 11.4–15.2)

## 2020-05-03 LAB — URINALYSIS, COMPLETE (UACMP) WITH MICROSCOPIC
Bilirubin Urine: NEGATIVE
Glucose, UA: NEGATIVE mg/dL
Hgb urine dipstick: NEGATIVE
Ketones, ur: NEGATIVE mg/dL
Leukocytes,Ua: NEGATIVE
Nitrite: NEGATIVE
Protein, ur: 30 mg/dL — AB
Specific Gravity, Urine: 1.024 (ref 1.005–1.030)
pH: 5 (ref 5.0–8.0)

## 2020-05-03 LAB — COMPREHENSIVE METABOLIC PANEL
ALT: 84 U/L — ABNORMAL HIGH (ref 0–44)
AST: 65 U/L — ABNORMAL HIGH (ref 15–41)
Albumin: 3.1 g/dL — ABNORMAL LOW (ref 3.5–5.0)
Alkaline Phosphatase: 122 U/L (ref 38–126)
Anion gap: 13 (ref 5–15)
BUN: 24 mg/dL — ABNORMAL HIGH (ref 8–23)
CO2: 14 mmol/L — ABNORMAL LOW (ref 22–32)
Calcium: 8.7 mg/dL — ABNORMAL LOW (ref 8.9–10.3)
Chloride: 102 mmol/L (ref 98–111)
Creatinine, Ser: 0.9 mg/dL (ref 0.44–1.00)
GFR, Estimated: >60 mL/min (ref >60)
Glucose, Bld: 118 mg/dL — ABNORMAL HIGH (ref 70–99)
Potassium: 4.9 mmol/L (ref 3.5–5.1)
Sodium: 129 mmol/L — ABNORMAL LOW (ref 135–145)
Total Bilirubin: 1.2 mg/dL (ref 0.3–1.2)
Total Protein: 6.5 g/dL (ref 6.5–8.1)

## 2020-05-03 LAB — APTT: aPTT: 37 seconds — ABNORMAL HIGH (ref 24–36)

## 2020-05-03 LAB — GLUCOSE, CAPILLARY
Glucose-Capillary: 95 mg/dL (ref 70–99)
Glucose-Capillary: 69 mg/dL — ABNORMAL LOW (ref 70–99)
Glucose-Capillary: 131 mg/dL — ABNORMAL HIGH (ref 70–99)

## 2020-05-03 LAB — BLOOD GAS, ARTERIAL
Acid-base deficit: 17.5 mmol/L — ABNORMAL HIGH (ref 0.0–2.0)
Bicarbonate: 6.9 mmol/L — ABNORMAL LOW (ref 20.0–28.0)
O2 Saturation: 99 %
Patient temperature: 37
pCO2 arterial: <19 mmHg — CL (ref 32.0–48.0)
pH, Arterial: 7.27 — ABNORMAL LOW (ref 7.350–7.450)
pO2, Arterial: 149 mmHg — ABNORMAL HIGH (ref 83.0–108.0)

## 2020-05-03 LAB — LACTIC ACID, PLASMA
Lactic Acid, Venous: 9.2 mmol/L (ref 0.5–1.9)
Lactic Acid, Venous: 9.9 mmol/L (ref 0.5–1.9)

## 2020-05-03 LAB — RESP PANEL BY RT-PCR (FLU A&B, COVID) ARPGX2
Influenza A by PCR: NEGATIVE
Influenza B by PCR: NEGATIVE
SARS Coronavirus 2 by RT PCR: NEGATIVE

## 2020-05-03 LAB — PROCALCITONIN
Procalcitonin: <0.1 ng/mL
Procalcitonin: 0.32 ng/mL

## 2020-05-03 LAB — FIBRIN DERIVATIVES D-DIMER (ARMC ONLY): Fibrin derivatives D-dimer (ARMC): 5786.73 ng/mL (FEU) — ABNORMAL HIGH (ref 0.00–499.00)

## 2020-05-03 LAB — ABO/RH: ABO/RH(D): O NEG

## 2020-05-03 LAB — GROUP A STREP BY PCR: Group A Strep by PCR: NOT DETECTED

## 2020-05-03 LAB — BRAIN NATRIURETIC PEPTIDE: B Natriuretic Peptide: 1764.7 pg/mL — ABNORMAL HIGH (ref 0.0–100.0)

## 2020-05-03 MED ORDER — LORAZEPAM 2 MG PO TABS
2.0000 mg | ORAL_TABLET | Freq: Once | ORAL | Status: AC
  Administered 2020-05-03: 2 mg via ORAL
  Filled 2020-05-03: qty 1

## 2020-05-03 MED ORDER — SODIUM CHLORIDE 0.9 % IV SOLN: 250.0000 mL | INTRAVENOUS | Status: DC

## 2020-05-03 MED ORDER — MORPHINE SULFATE (PF) 2 MG/ML IV SOLN
1.0000 mg | Freq: Once | INTRAVENOUS | Status: AC
  Administered 2020-05-03: 1 mg via INTRAVENOUS

## 2020-05-03 MED ORDER — NOREPINEPHRINE 4 MG/250ML-% IV SOLN
2.0000 ug/min | INTRAVENOUS | Status: DC
  Administered 2020-05-04: 6 ug/min via INTRAVENOUS
  Filled 2020-05-03: qty 250

## 2020-05-03 MED ORDER — POLYETHYLENE GLYCOL 3350 17 G PO PACK: 17.0000 g | PACK | Freq: Every day | ORAL | Status: DC | PRN

## 2020-05-03 MED ORDER — DEXTROSE 50 % IV SOLN
INTRAVENOUS | Status: AC
  Filled 2020-05-03: qty 100

## 2020-05-03 MED ORDER — PIPERACILLIN-TAZOBACTAM 3.375 G IVPB
3.3750 g | Freq: Three times a day (TID) | INTRAVENOUS | Status: DC
  Administered 2020-05-03 – 2020-05-04 (×2): 3.375 g via INTRAVENOUS
  Filled 2020-05-03 (×2): qty 50

## 2020-05-03 MED ORDER — VANCOMYCIN HCL 500 MG/100ML IV SOLN
500.0000 mg | Freq: Two times a day (BID) | INTRAVENOUS | Status: DC
  Filled 2020-05-03 (×2): qty 100

## 2020-05-03 MED ORDER — LACTATED RINGERS IV SOLN: INTRAVENOUS | Status: DC

## 2020-05-03 MED ORDER — SODIUM CHLORIDE 0.9 % IV SOLN: 10.0000 mL/h | Freq: Once | INTRAVENOUS | Status: DC

## 2020-05-03 MED ORDER — NOREPINEPHRINE 4 MG/250ML-% IV SOLN
INTRAVENOUS | Status: AC
  Administered 2020-05-03: 4 mg via INTRAVENOUS
  Filled 2020-05-03: qty 250

## 2020-05-03 MED ORDER — IOHEXOL 350 MG/ML SOLN
75.0000 mL | Freq: Once | INTRAVENOUS | Status: AC | PRN
  Administered 2020-05-03: 75 mL via INTRAVENOUS

## 2020-05-03 MED ORDER — DEXTROSE 50 % IV SOLN
12.5000 g | Freq: Once | INTRAVENOUS | Status: AC
  Administered 2020-05-03: 12.5 g via INTRAVENOUS
  Filled 2020-05-03: qty 50

## 2020-05-03 MED ORDER — FUROSEMIDE 10 MG/ML IJ SOLN
20.0000 mg | Freq: Once | INTRAMUSCULAR | Status: AC
  Administered 2020-05-03: 20 mg via INTRAVENOUS
  Filled 2020-05-03: qty 4

## 2020-05-03 MED ORDER — MORPHINE SULFATE (PF) 2 MG/ML IV SOLN
INTRAVENOUS | Status: AC
  Filled 2020-05-03: qty 1

## 2020-05-03 MED ORDER — SODIUM CHLORIDE 0.9 % IV SOLN
2.0000 g | Freq: Once | INTRAVENOUS | Status: AC
  Administered 2020-05-03: 2 g via INTRAVENOUS
  Filled 2020-05-03: qty 2

## 2020-05-03 MED ORDER — VANCOMYCIN HCL IN DEXTROSE 1-5 GM/200ML-% IV SOLN
1000.0000 mg | Freq: Once | INTRAVENOUS | Status: DC
  Filled 2020-05-03: qty 200

## 2020-05-03 MED ORDER — VANCOMYCIN HCL IN DEXTROSE 1-5 GM/200ML-% IV SOLN
1000.0000 mg | Freq: Once | INTRAVENOUS | Status: AC
  Administered 2020-05-03: 1000 mg via INTRAVENOUS

## 2020-05-03 MED ORDER — CHLORHEXIDINE GLUCONATE CLOTH 2 % EX PADS
6.0000 | MEDICATED_PAD | Freq: Every day | CUTANEOUS | Status: DC
  Administered 2020-05-04: 6 via TOPICAL

## 2020-05-03 MED ORDER — DOCUSATE SODIUM 100 MG PO CAPS: 100.0000 mg | ORAL_CAPSULE | Freq: Two times a day (BID) | ORAL | Status: DC | PRN

## 2020-05-03 MED ORDER — SODIUM BICARBONATE 8.4 % IV SOLN
100.0000 meq | Freq: Once | INTRAVENOUS | Status: AC
  Administered 2020-05-03: 100 meq via INTRAVENOUS
  Filled 2020-05-03: qty 100

## 2020-05-03 MED ORDER — VANCOMYCIN HCL 500 MG/100ML IV SOLN
500.0000 mg | Freq: Once | INTRAVENOUS | Status: DC
  Filled 2020-05-03: qty 100

## 2020-05-03 MED ORDER — SODIUM BICARBONATE 8.4 % IV SOLN
INTRAVENOUS | Status: DC
  Filled 2020-05-03 (×6): qty 850
  Filled 2020-05-03: qty 150
  Filled 2020-05-03 (×3): qty 850

## 2020-05-03 MED ORDER — LACTATED RINGERS IV BOLUS (SEPSIS): 10.0000 mL | Freq: Once | INTRAVENOUS | Status: DC

## 2020-05-03 MED ORDER — PANTOPRAZOLE SODIUM 40 MG IV SOLR
40.0000 mg | Freq: Every day | INTRAVENOUS | Status: DC
  Administered 2020-05-03: 40 mg via INTRAVENOUS
  Filled 2020-05-03: qty 40

## 2020-05-03 NOTE — ED Triage Notes (Addendum)
EMS brings pt in from [facility] for [facility] unrelieved by duoneb at facility; pt to triage via w/c with no distress noted; pt reports [facility] tonight with no accomp symptoms; st seen recently for thoracentesis

## 2020-05-03 NOTE — ED Notes (Signed)
Date and time results received: 05/11/2020 1528 (use smartphrase ".now" to insert current time)  Test:Lactic Acid Critical Value:9.2 Name of Provider Notified: Darnelle Catalan Orders Received? Or Actions Taken:fluids ordered

## 2020-05-03 NOTE — ED Notes (Signed)
Date and time results received: 05/19/2020 1540 (use smartphrase ".now" to insert current time)  Test: INR Critical Value: 5.31 Name of Provider Notified: Malinda Orders Received? Or Actions Taken?: none at this time

## 2020-05-03 NOTE — ED Provider Notes (Addendum)
[facility] Emergency Department Provider Note   ____________________________________________   First MD Initiated Contact with Patient May 10, 2020 [id number]     (approximate)  I have reviewed the triage vital signs and the nursing notes.   HISTORY  Chief Complaint Shortness of Breath    HPI [name] is a 74 y.o. female who comes from [place] [facility] with complaints of shortness of breath unrelieved by [facility] at the facility.  She reports she has congestive heart failure and recently had a liter of fluid removed by thoracentesis.  She complains of shortness of breath but no chest pain.  Shortness of breath is exacerbated by exertion.  Patient is on 2 L of oxygen at night but not during the day.  Here she was placed on 2 L of oxygen for comfort measures and when she was moving from the wheelchair to the bed says she was very short of breath increase this to 4 L briefly and she remains at 100%.      Past Medical History:  Diagnosis Date  . Acute respiratory failure (HCC)    Secondary to aspiration pneumonia   . Altered mental status    Secondary to viral herpes simplex virus encephalitis  . Anemia   . Atrial fibrillation (HCC)   . Bilateral pneumonia   . CHF (congestive heart failure) (HCC)   . Dysphasia   . Encephalitis   . Hyperglycemia   . Hypertension   . Hyponatremia   . IBS (irritable bowel syndrome)   . Seizures (HCC)   . Stroke [facility])     Patient Active Problem List   Diagnosis Date Noted  . Pleural effusion   . Leukocytosis 04/18/2020  . GERD (gastroesophageal reflux disease) 04/18/2020  . Acute CHF (congestive heart failure) (HCC) 04/09/2020  . Nocturnal hypoxia   . Atrial fibrillation, chronic (HCC)   . Weakness   . Severe sepsis (HCC) 03/29/2020  .  Possible Abscess of postoperative wound of abdominal wall 03/29/2020  . S/P laparotomy 03/12/20  03/29/2020  . History of CVA (cerebrovascular accident) 03/29/2020  . Hypotension    . Malnutrition of moderate degree 03/19/2020  . Hyponatremia 03/12/2020  . Chronic diastolic heart failure (HCC) 03/12/2020  . Large bowel obstruction (HCC) 03/12/2020  . Abdominal distention 03/11/2020  . Iron deficiency anemia 12/20/2019  . Acute on chronic diastolic CHF (congestive heart failure) (HCC) 12/04/2019  . Atrial fibrillation with RVR (HCC)   . Hypertension   . Stroke (HCC)   . Hypothyroidism   . Acute on chronic respiratory failure with hypoxia (HCC)   . Acute congestive heart failure (HCC) 03/17/2019  . Acute gastroenteritis 07/05/2017  . Chronic respiratory failure with hypoxia (HCC) 03/27/2011    Past Surgical History:  Procedure Laterality Date  . CHOLECYSTECTOMY    . COLECTOMY WITH COLOSTOMY CREATION/HARTMANN PROCEDURE N/A 03/12/2020   Procedure: COLECTOMY WITH COLOSTOMY CREATION/HARTMANN PROCEDURE;  Surgeon: Campbell Lerner, MD;  Location: [facility] ORS;  Service: General;  Laterality: N/A;  . COLONOSCOPY N/A 01/17/2020   Procedure: COLONOSCOPY;  Surgeon: Regis Bill, MD;  Location: [facility] ENDOSCOPY;  Service: Endoscopy;  Laterality: N/A;  . COLONOSCOPY WITH PROPOFOL N/A 12/31/2016   Procedure: COLONOSCOPY WITH PROPOFOL;  Surgeon: Scot Jun, MD;  Location: [facility] ENDOSCOPY;  Service: Endoscopy;  Laterality: N/A;  . ESOPHAGOGASTRODUODENOSCOPY (EGD) WITH PROPOFOL N/A 12/31/2016   Procedure: ESOPHAGOGASTRODUODENOSCOPY (EGD) WITH PROPOFOL;  Surgeon: Scot Jun, MD;  Location: [facility] ENDOSCOPY;  Service: Endoscopy;  Laterality: N/A;  . LAPAROTOMY  N/A 03/12/2020   Procedure: EXPLORATORY LAPAROTOMY;  Surgeon: Campbell Lerner, MD;  Location: [facility] ORS;  Service: General;  Laterality: N/A;  . TONSILLECTOMY    . TUBAL LIGATION      Prior to Admission medications   Medication Sig Start Date End Date Taking? Authorizing Provider  acetaminophen (TYLENOL) 325 MG tablet Take 650 mg by mouth every 6 (six) hours as needed.    [provider]  amiodarone  (PACERONE) 200 MG tablet Take 1 tablet (200 mg total) by mouth daily. 03/20/20   Darlin Priestly, MD  apixaban (ELIQUIS) 5 MG TABS tablet Take 1 tablet (5 mg total) by mouth 2 (two) times daily. 03/20/19   Altamese Dilling, MD  Ensure Max Protein (ENSURE MAX PROTEIN) LIQD Take 330 mLs (11 oz total) by mouth 2 (two) times daily. 03/20/20   Darlin Priestly, MD  folic acid (FOLVITE) 1 MG tablet Take 1 mg by mouth daily. Per tube once daily      [provider]  furosemide (LASIX) 20 MG tablet Take 1 tablet (20 mg total) by mouth daily. 04/24/20   Alford Highland, MD  hydrocerin (EUCERIN) CREA Apply 1 application topically 2 (two) times daily. 04/03/20   Alford Highland, MD  levothyroxine (SYNTHROID) 75 MCG tablet Take 75 mcg by mouth daily before breakfast.     [provider]  metoprolol succinate (TOPROL-XL) 50 MG 24 hr tablet Take 1 tablet (50 mg total) by mouth 2 (two) times daily. Take with or immediately following a meal. 04/03/20   Alford Highland, MD  midodrine (PROAMATINE) 2.5 MG tablet Take 1 tablet (2.5 mg total) by mouth 3 (three) times daily with meals. 04/03/20   Alford Highland, MD  Multiple Vitamin (MULTIVITAMIN WITH MINERALS) TABS tablet Take 1 tablet by mouth daily. 03/20/20   Darlin Priestly, MD  omeprazole (PRILOSEC) 20 MG capsule Take 20 mg by mouth daily.    [provider]  potassium chloride (KLOR-CON) 10 MEQ tablet Take 10 mEq by mouth daily.    [provider]  sodium chloride (OCEAN) 0.65 % SOLN nasal spray Place 2 sprays into both nostrils every 2 (two) hours as needed for congestion. 04/24/20   Alford Highland, MD  sodium chloride 1 g tablet Take 1 tablet (1 g total) by mouth 2 (two) times daily with a meal. 04/24/20   Alford Highland, MD    Allergies Prednisone and Predicort [prednisolone acetate]  Family History  Problem Relation Age of Onset  . Prostate cancer Father   . Breast cancer Maternal Aunt   . Breast cancer Maternal Aunt   .  Cerebral palsy Son     Social History Social History   Tobacco Use  . Smoking status: Never Smoker  . Smokeless tobacco: Never Used  Vaping Use  . Vaping Use: Never used  Substance Use Topics  . Alcohol use: No  . Drug use: No    Review of Systems  Constitutional: No fever/chills Eyes: No visual changes. ENT: No sore throat. Cardiovascular: Denies chest pain. Respiratory: shortness of breath. Gastrointestinal: No abdominal pain.  No nausea, no vomiting.  No diarrhea.  No constipation. Genitourinary: Negative for dysuria. Musculoskeletal: Negative for back pain. Skin: Negative for rash. Neurological: Negative for headaches, focal weakness   ____________________________________________   PHYSICAL EXAM:  VITAL SIGNS: ED Triage Vitals  Enc Vitals Group     BP 05/29/2020 0321 112/79     Pulse Rate 05/27/2020 0321 (!) 114     Resp 05/24/2020 0321 (!) 22  Temp 05/02/2020 0321 97.8 F (36.6 C)     Temp Source 05/19/2020 0321 Oral     SpO2 05/26/2020 0316 100 %     Weight 05/13/2020 0324 151 lb (68.5 kg)     Height 05/29/2020 0324 5\' 5"  (1.651 m)     Head Circumference --      Peak Flow --      Pain Score 05/21/2020 0322 10     Pain Loc --      Pain Edu? --      Excl. in GC? --     Constitutional: Alert and oriented.  Pale somewhat anxious and breathing hard Eyes: Conjunctivae are normal. Head: Atraumatic. Nose: No congestion/rhinnorhea. Mouth/Throat: Mucous membranes are moist.  Oropharynx non-erythematous. Neck: No stridor. Cardiovascular: Rapid and irregularly irregular rhythm grossly normal heart sounds.  Good peripheral circulation. Respiratory: Normal respiratory effort.  No retractions. Lungs crackles and some quietness in the bases Gastrointestinal: Soft and nontender. No distention. No abdominal bruits.  Musculoskeletal: No lower extremity tenderness bilateral 2+ edema edema.   Neurologic:  Normal speech and language. No gross focal neurologic deficits are  appreciated. . Skin:  Skin is warm, dry and intact. No rash noted.   ____________________________________________   LABS (all labs ordered are listed, but only abnormal results are displayed)  Labs Reviewed  CBC WITH DIFFERENTIAL/PLATELET - Abnormal; Notable for the following components:      Result Value   Hemoglobin 11.6 (*)    MCV 79.2 (*)    MCH 24.4 (*)    RDW 16.8 (*)    nRBC 0.7 (*)    Neutro Abs 8.2 (*)    Abs Immature Granulocytes 0.13 (*)    All other components within normal limits  COMPREHENSIVE METABOLIC PANEL - Abnormal; Notable for the following components:   Sodium 129 (*)    CO2 14 (*)    Glucose, Bld 118 (*)    BUN 24 (*)    Calcium 8.7 (*)    Albumin 3.1 (*)    AST 65 (*)    ALT 84 (*)    All other components within normal limits  BRAIN NATRIURETIC PEPTIDE - Abnormal; Notable for the following components:   B Natriuretic Peptide 1,764.7 (*)    All other components within normal limits  FIBRIN DERIVATIVES D-DIMER ([facility] ONLY) - Abnormal; Notable for the following components:   Fibrin derivatives D-dimer [facility]) 4,098.11 (*)    All other components within normal limits  BASIC METABOLIC PANEL - Abnormal; Notable for the following components:   Sodium 132 (*)    Potassium 5.5 (*)    CO2 10 (*)    Glucose, Bld <20 (*)    BUN 28 (*)    Creatinine, Ser 1.09 (*)    GFR, Estimated 53 (*)    Anion gap 21 (*)    All other components within normal limits  CBC WITH DIFFERENTIAL/PLATELET - Abnormal; Notable for the following components:   WBC 12.8 (*)    Hemoglobin 11.7 (*)    MCH 23.9 (*)    MCHC 28.8 (*)    RDW 17.3 (*)    nRBC 0.9 (*)    Neutro Abs 10.8 (*)    Abs Immature Granulocytes 0.23 (*)    All other components within normal limits  LACTIC ACID, PLASMA - Abnormal; Notable for the following components:   Lactic Acid, Venous 9.2 (*)    All other components within normal limits  URINALYSIS, COMPLETE (UACMP) WITH MICROSCOPIC - Abnormal; Notable  for  the following components:   Color, Urine YELLOW (*)    APPearance HAZY (*)    Protein, ur 30 (*)    Bacteria, UA RARE (*)    All other components within normal limits  CBG MONITORING, ED - Abnormal; Notable for the following components:   Glucose-Capillary <10 (*)    All other components within normal limits  CBG MONITORING, ED - Abnormal; Notable for the following components:   Glucose-Capillary 281 (*)    All other components within normal limits  CBG MONITORING, ED - Abnormal; Notable for the following components:   Glucose-Capillary 159 (*)    All other components within normal limits  CBG MONITORING, ED - Abnormal; Notable for the following components:   Glucose-Capillary 161 (*)    All other components within normal limits  GROUP A STREP BY PCR  RESP PANEL BY RT-PCR (FLU A&B, COVID) ARPGX2  CULTURE, BLOOD (ROUTINE X 2)  CULTURE, BLOOD (ROUTINE X 2)  URINE CULTURE  PROCALCITONIN  LACTIC ACID, PLASMA  PROTIME-INR  APTT  CBG MONITORING, ED  TROPONIN I (HIGH SENSITIVITY)  TROPONIN I (HIGH SENSITIVITY)  TROPONIN I (HIGH SENSITIVITY)  TROPONIN I (HIGH SENSITIVITY)   ____________________________________________  EKG  EKG read interpreted by me shows A. fib with RVR at 114 rightward axis flipped T's in V6 these are not new changes ____EKG #2 read interpreted by me shows a flutter at a rate of 99 still with right axis improvement of the ST T changes which were likely rate related on the previous EKG. ________________________________________  RADIOLOGY Jill Poling, personally viewed and evaluated these images (plain radiographs) as part of my medical decision making, as well as reviewing the written report by the radiologist.  ED MD interpretation: Chest x-ray read by radiology reviewed by me shows some bilateral pleural effusions and right basilar opacities.  This could be CHF or pneumonia or atelectasis.  Official radiology report(s): DG Chest 2 View  Result  Date: 05/19/2020 CLINICAL DATA:  Shortness of breath EXAM: CHEST - 2 VIEW COMPARISON:  04/24/2020 FINDINGS: Small pleural effusions. Right basilar opacities. Mild cardiomegaly. No pneumothorax. IMPRESSION: Small bilateral pleural effusions with right basilar opacities, possibly pneumonia or atelectasis. Electronically Signed   By: Deatra Robinson M.D.   On: 05/15/2020 03:51   CT Head Wo Contrast  Result Date: 05/30/2020 CLINICAL DATA:  Altered mental status EXAM: CT HEAD WITHOUT CONTRAST TECHNIQUE: Contiguous axial images were obtained from the base of the skull through the vertex without intravenous contrast. COMPARISON:  January 13, 2018 head CT; brain MRI February 12, 2018 FINDINGS: Brain: There is a degree of stable age related volume loss. Right lateral ventricle is slightly larger than the left lateral ventricle, likely due to encephalomalacia based on prior infarcts on the right. There is no evident intracranial mass, hemorrhage, extra-axial fluid collection, or midline shift. There is evidence of a sizable right inferior to mid frontal and anterior in the mid right temporal lobe infarct, stable. Elsewhere, there is patchy small vessel disease in the centra semiovale bilaterally. No acute infarct is demonstrable. Vascular: No hyperdense vessel. There is mild calcification in the carotid siphon regions. Skull: Bony calvarium appears intact. Sinuses/Orbits: Visualized paranasal sinuses are clear. There is rightward deviation of the nasal septum. Orbits appear symmetric bilaterally. Other: Visualized mastoid air cells are clear. There is debris in the right external auditory canal. IMPRESSION: Prior right frontal and temporal lobe infarcts, stable. Age related volume loss with patchy periventricular small vessel  disease is stable. No acute infarct. No mass or hemorrhage. There is mild arterial vascular calcification. There is rightward deviation of the nasal septum. There is probable cerumen in the right  external auditory canal. Electronically Signed   By: Bretta BangWilliam  Woodruff III M.D.   On: 05/04/2020 13:45   CT Angio Chest PE W and/or Wo Contrast  Result Date: 05/22/2020 CLINICAL DATA:  Shortness of breath, acute generalized abdominal pain. EXAM: CT ANGIOGRAPHY CHEST CT ABDOMEN AND PELVIS WITH CONTRAST TECHNIQUE: Multidetector CT imaging of the chest was performed using the standard protocol during bolus administration of intravenous contrast. Multiplanar CT image reconstructions and MIPs were obtained to evaluate the vascular anatomy. Multidetector CT imaging of the abdomen and pelvis was performed using the standard protocol during bolus administration of intravenous contrast. CONTRAST:  75 mL of Omnipaque 350 intravenously. COMPARISON:  March 28, 2020. FINDINGS: CTA CHEST FINDINGS Cardiovascular: Satisfactory opacification of the pulmonary arteries to the segmental level. No evidence of pulmonary embolism. Moderate cardiomegaly is noted. Moderate left atrial enlargement is noted. Atherosclerosis of thoracic aorta is noted without aneurysm formation. No pericardial effusion. Mediastinum/Nodes: No enlarged mediastinal, hilar, or axillary lymph nodes. Thyroid gland, trachea, and esophagus demonstrate no significant findings. Lungs/Pleura: Large right pleural effusion is noted which appears to be loculated. Moderate left pleural effusion is noted. Atelectasis of both lower lobes is noted. No pneumothorax is noted. Musculoskeletal: No chest wall abnormality. No acute or significant osseous findings. Review of the MIP images confirms the above findings. CT ABDOMEN and PELVIS FINDINGS Hepatobiliary: Status post cholecystectomy. No biliary dilatation is noted. Stable left hepatic cysts are noted. Pancreas: Unremarkable. No pancreatic ductal dilatation or surrounding inflammatory changes. Spleen: Normal in size without focal abnormality. Adrenals/Urinary Tract: Adrenal glands are unremarkable. Kidneys are normal,  without renal calculi, focal lesion, or hydronephrosis. Bladder is unremarkable. Stomach/Bowel: The stomach appears normal. There is no evidence of bowel obstruction or inflammation. Ostomy is seen in the right lower quadrant. Status post subtotal colectomy. Vascular/Lymphatic: Aortic atherosclerosis. No enlarged abdominal or pelvic lymph nodes. Reproductive: Uterus and bilateral adnexa are unremarkable. Other: Mild ascites is noted in the abdomen and pelvis. No hernia is noted. Musculoskeletal: No acute or significant osseous findings. Review of the MIP images confirms the above findings. IMPRESSION: 1. No definite evidence of pulmonary embolus. 2. Large right pleural effusion is noted which appears to be loculated. 3. Moderate left pleural effusion is noted. 4. Atelectasis of both lower lobes is noted. 5. Mild ascites is noted in the abdomen and pelvis. 6. Status post subtotal colectomy. Ostomy is seen in the right lower quadrant. 7. Aortic atherosclerosis. Aortic Atherosclerosis (ICD10-I70.0). Electronically Signed   By: Lupita RaiderJames  Green Jr M.D.   On: 05/29/2020 13:53   CT ABDOMEN PELVIS W CONTRAST  Result Date: 05/22/2020 CLINICAL DATA:  Shortness of breath, acute generalized abdominal pain. EXAM: CT ANGIOGRAPHY CHEST CT ABDOMEN AND PELVIS WITH CONTRAST TECHNIQUE: Multidetector CT imaging of the chest was performed using the standard protocol during bolus administration of intravenous contrast. Multiplanar CT image reconstructions and MIPs were obtained to evaluate the vascular anatomy. Multidetector CT imaging of the abdomen and pelvis was performed using the standard protocol during bolus administration of intravenous contrast. CONTRAST:  75 mL of Omnipaque 350 intravenously. COMPARISON:  March 28, 2020. FINDINGS: CTA CHEST FINDINGS Cardiovascular: Satisfactory opacification of the pulmonary arteries to the segmental level. No evidence of pulmonary embolism. Moderate cardiomegaly is noted. Moderate left  atrial enlargement is noted. Atherosclerosis of thoracic aorta is noted  without aneurysm formation. No pericardial effusion. Mediastinum/Nodes: No enlarged mediastinal, hilar, or axillary lymph nodes. Thyroid gland, trachea, and esophagus demonstrate no significant findings. Lungs/Pleura: Large right pleural effusion is noted which appears to be loculated. Moderate left pleural effusion is noted. Atelectasis of both lower lobes is noted. No pneumothorax is noted. Musculoskeletal: No chest wall abnormality. No acute or significant osseous findings. Review of the MIP images confirms the above findings. CT ABDOMEN and PELVIS FINDINGS Hepatobiliary: Status post cholecystectomy. No biliary dilatation is noted. Stable left hepatic cysts are noted. Pancreas: Unremarkable. No pancreatic ductal dilatation or surrounding inflammatory changes. Spleen: Normal in size without focal abnormality. Adrenals/Urinary Tract: Adrenal glands are unremarkable. Kidneys are normal, without renal calculi, focal lesion, or hydronephrosis. Bladder is unremarkable. Stomach/Bowel: The stomach appears normal. There is no evidence of bowel obstruction or inflammation. Ostomy is seen in the right lower quadrant. Status post subtotal colectomy. Vascular/Lymphatic: Aortic atherosclerosis. No enlarged abdominal or pelvic lymph nodes. Reproductive: Uterus and bilateral adnexa are unremarkable. Other: Mild ascites is noted in the abdomen and pelvis. No hernia is noted. Musculoskeletal: No acute or significant osseous findings. Review of the MIP images confirms the above findings. IMPRESSION: 1. No definite evidence of pulmonary embolus. 2. Large right pleural effusion is noted which appears to be loculated. 3. Moderate left pleural effusion is noted. 4. Atelectasis of both lower lobes is noted. 5. Mild ascites is noted in the abdomen and pelvis. 6. Status post subtotal colectomy. Ostomy is seen in the right lower quadrant. 7. Aortic atherosclerosis.  Aortic Atherosclerosis (ICD10-I70.0). Electronically Signed   By: Lupita Raider M.D.   On: 05/07/2020 13:53   DG Chest Port 1 View  Result Date: 05/09/2020 CLINICAL DATA:  Short of breath.  Reported recent thoracentesis. EXAM: PORTABLE CHEST 1 VIEW COMPARISON:  05/26/2020 at 3:25 a.m., and older studies. FINDINGS: Moderate bilateral pleural effusions obscure the hemidiaphragms and portions of the heart borders, unchanged from the earlier study. There is additional opacity at the lung bases consistent with atelectasis. No convincing pulmonary edema.  No pneumothorax. No mediastinal or hilar masses. IMPRESSION: 1. No interval change from the study obtained earlier today. 2. Moderate bilateral pleural effusions with associated lung base opacity, latter finding consistent with atelectasis. Consider pneumonia if there are consistent clinical findings. No convincing pulmonary edema. Electronically Signed   By: Amie Portland M.D.   On: 05/02/2020 13:18    ____________________________________________   PROCEDURES  Procedure(s) performed (including Critical Care): Critical care time an hour and 15 minutes.  This includes going to CT with the patient and spending a good deal of time at the bedside at the bedside when the patient decompensated.  I also discussed her in detail with the intensivist Dr.Aleskerov  Procedures   ____________________________________________   INITIAL IMPRESSION / ASSESSMENT AND PLAN / ED COURSE Called to see patient at 1 A a.m.  Nurse reports she had recently been talking to the patient was doing well now she is unresponsive tachypneic blood pressure has dropped O2 sats is gone down patient is flushed and sweaty as well.  EKG read by me is unchanged sinus tachycardia at 109 a flutter right axis no acute changes we will get a chest x-ray and then CT her head and chest.  Is possible that she threw a PE since her D-dimer was so high.  We will also give her a small fluid bolus  since her blood pressure is a little bit lower.      ----------------------------------------- 2:44  PM on May 30, 2020 -----------------------------------------  Patient developed severe hypoglycemia.  Sugar went up to 281 after 1 amp of D50 and has already dropped down in 250 range.  We will repeat the blood sugar again.  We are going to catheterize the patient for urine as she has not Produced any urine even after Lasix.  Her white count has gone up from 10 to 12.  SHe has mostly polys with 2% immature granulocytes which is also increased from earlier today.  We will recheck her fingerstick again quickly.  Patient additionally has gotten hypotensive dropping her pressure briefly into the 80s.  It improved when I put her in Trendelenburg we have started very low-dose of Levophed on her as I do not want to give her any more fluids at this point.   Because of the episode of unexplained hypoglycemia and hypotension along with the rising white blood count I will give her antibiotics.  I will otherwise treat this is sepsis except for IV fluids.   ----------------------------------------- 3:30 PM on 05-30-20 ----------------------------------------- Discussed in detail with Dr. Karna Christmas  He agrees that this may be sepsis.  We will get her in the ICU.  There is no one on for cardiothoracic surgery today.  I understand that Dr. Karna Christmas will check on the loculated pleural effusion.  And consult Dr. Inez Catalina if he is in tomorrow.  In the meantime we will give her antibiotics.  She is on 5 mics per minute of Levophed and her pressure has been stable.  She is however still unresponsive and will mumble occasionally if you talk to her loudly.  Blood pressure currently was 116.  Blood pressure immediately after that was 111.  Heart rate has been stable in the 108 range.  Last blood sugar was 161.  ----------------------------------------- 3:35 PM on May 30, 2020 -----------------------------------------  Lactic  acid just came back at 9.2.  I will have to give her fluids.  I will give her LR at 200 for now.  I will talk to Dr. Karna Christmas about the chest tube again.  ----------------------------------------- 3:43 PM on 30-May-2020 -----------------------------------------  Patient's INR comes back markedly elevated.  We will give her some FFP prior to any attempt at draining the effusions     ____________________________________________   FINAL CLINICAL IMPRESSION(S) / ED DIAGNOSES  Final diagnoses:  Altered mental status, unspecified altered mental status type  Dyspnea, unspecified type  Acute on chronic congestive heart failure, unspecified heart failure type [facility])     ED Discharge Orders    None      *Please note:  [name] was evaluated in Emergency Department on May 30, 2020 for the symptoms described in the history of present illness. She was evaluated in the context of the global COVID-19 pandemic, which necessitated consideration that the patient might be at risk for infection with the SARS-CoV-2 virus that causes COVID-19. Institutional protocols and algorithms that pertain to the evaluation of patients at risk for COVID-19 are in a state of rapid change based on information released by regulatory bodies including the CDC and federal and state organizations. These policies and algorithms were followed during the patient's care in the ED.  Some ED evaluations and interventions may be delayed as a result of limited staffing during and the pandemic.*   Note:  This document was prepared using Dragon voice recognition software and may include unintentional dictation errors.    Arnaldo Natal, MD 05-30-2020 1536    Arnaldo Natal, MD 05-30-2020 1544 Dr.Aleskerov comes down from  upstairs and begins evaluating the patient.  She is now awake although not quite as lively as she was when she got here.  We will give her 2 FFP's.  Try to do the chest tube about an hour afterwards at his  suggestion.  After discussing it with my colleague in the ER we will not try to put the chest tube in ourselves we will get interventional radiology to do this.  Part of the loculation on the right side is quite small and higher and separated from the other parts of the effusion.   Arnaldo Natal, MD 21-May-2020 [phone]

## 2020-05-03 NOTE — Plan of Care (Signed)
  Problem: Clinical Measurements: Goal: Ability to maintain clinical measurements within normal limits will improve Outcome: Progressing Goal: Will remain free from infection Outcome: Progressing Goal: Diagnostic test results will improve Outcome: Progressing Goal: Respiratory complications will improve Outcome: Progressing   Problem: Pain Managment: Goal: General experience of comfort will improve Outcome: Progressing   

## 2020-05-03 NOTE — Consult Note (Signed)
Pharmacy Antibiotic Note  [name] is a 74 y.o. female admitted on 05/30/2020 with pneumonia. Pt presenting from nursing home with altered mentation in circulatory shock with bilateral pneumonia and parapneumonic effusions admitted with septic shock due to CAP with severe lactic acidosis, hypoglycemia, requiring IVF and levophed. PMH includes afib, CHF, HTN, seizures, and stroke. Pt recently admitted 11/8 for shortness of breath and 11/17 for shortness of breath and chest pain - pt received no antibiotics during these admissions. Pharmacy has been consulted for vancomycin and Zosyn dosing.  LA 9.9, PCT 0.32, afebrile, HR 100-110, RR 20-25, WBC 12.8  CXR: Moderate bilateral pleural effusions with associated lung base opacity, latter finding consistent with atelectasis. Consider pneumonia if there are consistent clinical findings. No convincing pulmonary edema.  Day 1 abx  Plan: Zosyn 3.375 mg q8h  Vancomycin 500 mg IV every 12 hours.  Goal trough 15-20 mcg/mL.  Monitor Scr with AM labs.  Follow up on TTE results.  Height: 5\' 5"  (165.1 cm) Weight: 68.5 kg (151 lb) IBW/kg (Calculated) : 57  Temp (24hrs), Avg:97.9 F (36.6 C), Min:97.8 F (36.6 C), Max:98 F (36.7 C)  Recent Labs  Lab 05/12/2020 0328 05/18/2020 1340 05/10/2020 1451  WBC 10.0 12.8*  --   CREATININE 0.90 1.09*  --   LATICACIDVEN  --   --  9.2*    Estimated Creatinine Clearance: 42.7 mL/min (A) (by C-G formula based on SCr of 1.09 mg/dL (H)).    Allergies  Allergen Reactions  . Prednisone Hives  . Predicort [Prednisolone Acetate]     HIVES,    Antimicrobials this admission: 12/2 cefepime x 1  12/2 Zosyn >> 12/2 vancomycin >>  Microbiology results: 12/2 BCx: pending 12/2 UCx: pending   Thank you for allowing pharmacy to be a part of this patient's care.  14/2, PharmD Pharmacy Resident  05/17/2020 6:26 PM

## 2020-05-03 NOTE — ED Notes (Signed)
Patient states that she is hot and to turn down the air. Air turned down per request.

## 2020-05-03 NOTE — Sepsis Progress Note (Signed)
[facility] is monitoring this sepsis. 

## 2020-05-03 NOTE — Progress Notes (Signed)
CODE SEPSIS - PHARMACY COMMUNICATION  **Broad Spectrum Antibiotics should be administered within 1 hour of Sepsis diagnosis**  Time Code Sepsis Called/Page Received: 12/2 1412  Antibiotics Ordered: 12/2 1415  Time of 1st antibiotic administration: 1426  Additional action taken by pharmacy: secure chat to ensure med available in pyxis  If necessary, Name of Provider/Nurse Contacted: Jeryl Columbia, RN confirmed and was setting up infusion.   Martyn Malay ,PharmD Clinical Pharmacist  05/07/2020  2:14 PM

## 2020-05-03 NOTE — ED Notes (Signed)
Patient has had 0 urine output since Lasix was given. Malinda MD advised.

## 2020-05-03 NOTE — ED Notes (Signed)
Lab contacted for second Lactic draw

## 2020-05-03 NOTE — ED Notes (Signed)
Went to round on patient and she was diaphoretic, LOC significantly changed since previously. Charge RN advised she noticed decline inpatient as well when attempted to place an IV. Patient was previous alert and oriented. Now not alert and oriented. Dr Darnelle Catalan notified. Malinda at bedside requested STAT Chest X-ray. Technician notified. Order placed per verbal request.

## 2020-05-03 NOTE — H&P (Signed)
CRITICAL CARE PROGRESS NOTE    Name: [name] MRN: [id number] DOB: April 23, 1946     LOS: 0   SUBJECTIVE FINDINGS & SIGNIFICANT EVENTS    Patient description:  22 F from [facility] with PMH as outlined below came in with altered mentation in circulatory shock with bilateral pneumonia and parapneumonic effusions admitted with septic shock due to CAP with severe lactic acidosis, hypoglycemia, requiring IVF and Levophed.  PCCM asked for MICU admit due to shock on vasopressor.   Ive attempted to reach family multiple times (son is POA) no answer on cell phone.  Were able to speak with [facility] and got some information on her as well as confirm code status is full code.   Lines/tubes : Ileostomy Continent (Abdominal pouch) RUQ (Active)  Ostomy Pouch 1 piece 04/24/20 1330  Stoma Assessment Pink 04/24/20 1330  Peristomal Assessment Intact 04/24/20 1330  Treatment Other (Comment) 04/24/20 1330  Output (mL) 425 mL 04/24/20 1736     Urethral Catheter Vernona Rieger RN (Active)  Indication for Insertion or Continuance of Catheter Acute urinary retention (I&O Cath for 24 hrs prior to catheter insertion- Inpatient Only) 05/20/2020 1458  Site Assessment Clean;Intact 05/27/2020 1458  Catheter Maintenance Bag below level of bladder;Catheter secured;Drainage bag/tubing not touching floor 05/20/2020 1458  Collection Container Standard drainage bag 06/01/2020 1458    Microbiology/Sepsis markers: Results for orders placed or performed during the hospital encounter of 05/04/2020  Group A Strep by PCR ([facility] Only)     Status: None   Collection Time: 05/08/2020  3:28 AM   Specimen: Nasopharyngeal Swab; Sterile Swab  Result Value Ref Range Status   Group A Strep by PCR NOT DETECTED NOT DETECTED Final    Comment: Performed at  [facility], [address], [place], [place] [address]  Resp Panel by RT-PCR (Flu A&B, Covid) Nasopharyngeal Swab     Status: None   Collection Time: 06/01/2020  3:28 AM   Specimen: Nasopharyngeal Swab; Nasopharyngeal(NP) swabs in vial transport medium  Result Value Ref Range Status   SARS Coronavirus 2 by RT PCR NEGATIVE NEGATIVE Final    Comment: (NOTE) SARS-CoV-2 target nucleic acids are NOT DETECTED.  The SARS-CoV-2 RNA is generally detectable in upper respiratory specimens during the acute phase of infection. The lowest concentration of SARS-CoV-2 viral copies this assay can detect is 138 copies/mL. A negative result does not preclude SARS-Cov-2 infection and should not be used as the sole basis for treatment or other patient management decisions. A negative result may occur with  improper specimen collection/handling, submission of specimen other than nasopharyngeal swab, presence of viral mutation(s) within the areas targeted by this assay, and inadequate number of viral copies(<138 copies/mL). A negative result must be combined with clinical observations, patient history, and epidemiological information. The expected result is Negative.  Fact Sheet for Patients:  BloggerCourse.com  Fact Sheet for Healthcare Providers:  SeriousBroker.it  This test is no t yet approved or cleared by the [place] FDA and  has been authorized for detection and/or diagnosis of SARS-CoV-2 by FDA under an Emergency Use Authorization (EUA). This EUA will remain  in effect (meaning this test can be used) for the duration of the COVID-19 declaration under Section 564(b)(1) of the Act, 21 U.S.C.section 360bbb-3(b)(1), unless the authorization is terminated  or revoked sooner.       Influenza A by PCR NEGATIVE NEGATIVE Final   Influenza B by PCR NEGATIVE NEGATIVE Final    Comment: (NOTE)  The Xpert Xpress SARS-CoV-2/FLU/RSV plus assay  is intended as an aid in the diagnosis of influenza from Nasopharyngeal swab specimens and should not be used as a sole basis for treatment. Nasal washings and aspirates are unacceptable for Xpert Xpress SARS-CoV-2/FLU/RSV testing.  Fact Sheet for Patients: BloggerCourse.com  Fact Sheet for Healthcare Providers: SeriousBroker.it  This test is not yet approved or cleared by the [place] FDA and has been authorized for detection and/or diagnosis of SARS-CoV-2 by FDA under an Emergency Use Authorization (EUA). This EUA will remain in effect (meaning this test can be used) for the duration of the COVID-19 declaration under Section 564(b)(1) of the Act, 21 U.S.C. section 360bbb-3(b)(1), unless the authorization is terminated or revoked.  Performed at [facility], [address] [place], [place] [address]     Anti-infectives:  Anti-infectives (From admission, onward)   Start     Dose/Rate Route Frequency Ordered Stop   05/07/2020 1515  vancomycin (VANCOREADY) IVPB 500 mg/100 mL       "Followed by" Linked Group Details   500 mg 100 mL/hr over 60 Minutes Intravenous  Once 05/09/2020 1413     05/17/2020 1415  ceFEPIme (MAXIPIME) 2 g in sodium chloride 0.9 % 100 mL IVPB        2 g 200 mL/hr over 30 Minutes Intravenous  Once 05/27/2020 1405 05/17/2020 1451   05/16/2020 1415  vancomycin (VANCOCIN) IVPB 1000 mg/200 mL premix  Status:  Discontinued        1,000 mg 200 mL/hr over 60 Minutes Intravenous  Once 05/27/2020 1405 05/15/2020 1413   05/24/2020 1415  vancomycin (VANCOCIN) IVPB 1000 mg/200 mL premix       "Followed by" Linked Group Details   1,000 mg 200 mL/hr over 60 Minutes Intravenous  Once 05/08/2020 1413 05/10/2020 1536      PAST MEDICAL HISTORY   Past Medical History:  Diagnosis Date  . Acute respiratory failure (HCC)    Secondary to aspiration pneumonia   . Altered mental status    Secondary to viral herpes simplex virus  encephalitis  . Anemia   . Atrial fibrillation (HCC)   . Bilateral pneumonia   . CHF (congestive heart failure) (HCC)   . Dysphasia   . Encephalitis   . Hyperglycemia   . Hypertension   . Hyponatremia   . IBS (irritable bowel syndrome)   . Seizures (HCC)   . Stroke [facility])      SURGICAL HISTORY   Past Surgical History:  Procedure Laterality Date  . CHOLECYSTECTOMY    . COLECTOMY WITH COLOSTOMY CREATION/HARTMANN PROCEDURE N/A 03/12/2020   Procedure: COLECTOMY WITH COLOSTOMY CREATION/HARTMANN PROCEDURE;  Surgeon: Campbell Lerner, MD;  Location: [facility] ORS;  Service: General;  Laterality: N/A;  . COLONOSCOPY N/A 01/17/2020   Procedure: COLONOSCOPY;  Surgeon: Regis Bill, MD;  Location: [facility] ENDOSCOPY;  Service: Endoscopy;  Laterality: N/A;  . COLONOSCOPY WITH PROPOFOL N/A 12/31/2016   Procedure: COLONOSCOPY WITH PROPOFOL;  Surgeon: Scot Jun, MD;  Location: [facility] ENDOSCOPY;  Service: Endoscopy;  Laterality: N/A;  . ESOPHAGOGASTRODUODENOSCOPY (EGD) WITH PROPOFOL N/A 12/31/2016   Procedure: ESOPHAGOGASTRODUODENOSCOPY (EGD) WITH PROPOFOL;  Surgeon: Scot Jun, MD;  Location: [facility] ENDOSCOPY;  Service: Endoscopy;  Laterality: N/A;  . LAPAROTOMY N/A 03/12/2020   Procedure: EXPLORATORY LAPAROTOMY;  Surgeon: Campbell Lerner, MD;  Location: [facility] ORS;  Service: General;  Laterality: N/A;  . TONSILLECTOMY    . TUBAL LIGATION       FAMILY HISTORY   Family History  Problem Relation Age of Onset  . Prostate cancer Father   . Breast cancer Maternal Aunt   . Breast cancer Maternal Aunt   . Cerebral palsy Son      SOCIAL HISTORY   Social History   Tobacco Use  . Smoking status: Never Smoker  . Smokeless tobacco: Never Used  Vaping Use  . Vaping Use: Never used  Substance Use Topics  . Alcohol use: No  . Drug use: No     MEDICATIONS   Current Medication:  Current Facility-Administered Medications:  .  0.9 %  sodium chloride infusion, 10 mL/hr, Intravenous,  Once, Arnaldo Natal, MD .  0.9 %  sodium chloride infusion, 250 mL, Intravenous, Continuous, Cherlyn Syring, MD .  docusate sodium (COLACE) capsule 100 mg, 100 mg, Oral, BID PRN, Karna Christmas, Lyndon Chenoweth, MD .  lactated ringers bolus 10 mL, 10 mL, Intravenous, Once, Arnaldo Natal, MD .  lactated ringers infusion, , Intravenous, Continuous, Arnaldo Natal, MD, Last Rate: 150 mL/hr at 05/24/2020 1544, New Bag at 05/16/2020 1544 .  pantoprazole (PROTONIX) injection 40 mg, 40 mg, Intravenous, QHS, Valli Randol, MD .  polyethylene glycol (MIRALAX / GLYCOLAX) packet 17 g, 17 g, Oral, Daily PRN, Vida Rigger, MD .  [address] vancomycin (VANCOCIN) IVPB 1000 mg/200 mL premix, 1,000 mg, Intravenous, Once, Stopped at 05/11/2020 1536 **FOLLOWED BY** vancomycin (VANCOREADY) IVPB 500 mg/100 mL, 500 mg, Intravenous, Once, Beers, Sherlynn Carbon, RPH  Current Outpatient Medications:  .  acetaminophen (TYLENOL) 325 MG tablet, Take 650 mg by mouth every 6 (six) hours as needed., Disp: , Rfl:  .  amiodarone (PACERONE) 200 MG tablet, Take 1 tablet (200 mg total) by mouth daily., Disp: , Rfl:  .  apixaban (ELIQUIS) 5 MG TABS tablet, Take 1 tablet (5 mg total) by mouth 2 (two) times daily., Disp: 60 tablet, Rfl: 0 .  Ensure Max Protein (ENSURE MAX PROTEIN) LIQD, Take 330 mLs (11 oz total) by mouth 2 (two) times daily., Disp: , Rfl:  .  folic acid (FOLVITE) 1 MG tablet, Take 1 mg by mouth daily. Per tube once daily  , Disp: , Rfl:  .  furosemide (LASIX) 20 MG tablet, Take 1 tablet (20 mg total) by mouth daily., Disp: 30 tablet, Rfl: 0 .  hydrocerin (EUCERIN) CREA, Apply 1 application topically 2 (two) times daily., Disp: 113 g, Rfl: 0 .  levothyroxine (SYNTHROID) 75 MCG tablet, Take 75 mcg by mouth daily before breakfast. , Disp: , Rfl:  .  metoprolol succinate (TOPROL-XL) 50 MG 24 hr tablet, Take 1 tablet (50 mg total) by mouth 2 (two) times daily. Take with or immediately following a meal., Disp: 60 tablet, Rfl: 0 .   midodrine (PROAMATINE) 2.5 MG tablet, Take 1 tablet (2.5 mg total) by mouth 3 (three) times daily with meals., Disp: 90 tablet, Rfl: 0 .  Multiple Vitamin (MULTIVITAMIN WITH MINERALS) TABS tablet, Take 1 tablet by mouth daily., Disp: , Rfl:  .  omeprazole (PRILOSEC) 20 MG capsule, Take 20 mg by mouth daily., Disp: , Rfl:  .  potassium chloride (KLOR-CON) 10 MEQ tablet, Take 10 mEq by mouth daily., Disp: , Rfl:  .  sodium chloride (OCEAN) 0.65 % SOLN nasal spray, Place 2 sprays into both nostrils every 2 (two) hours as needed for congestion., Disp: 60 mL, Rfl: 0 .  sodium chloride 1 g tablet, Take 1 tablet (1 g total) by mouth 2 (two) times daily with a meal., Disp: 60 tablet, Rfl: 0  ALLERGIES   Prednisone and Predicort [prednisolone acetate]    REVIEW OF SYSTEMS    Confused and altered unable to get ROS  PHYSICAL EXAMINATION   Vital Signs: Temp:  [97.8 F (36.6 C)-97.9 F (36.6 C)] 97.9 F (36.6 C) (12/02 0730) Pulse Rate:  [83-125] 105 (12/02 1645) Resp:  [16-58] 33 (12/02 1645) BP: (87-167)/(52-135) 167/135 (12/02 1645) SpO2:  [96 %-100 %] 100 % (12/02 1645) Weight:  [68.5 kg] 68.5 kg (12/02 0324)  GENERAL:age appropriate chronically ill  HEAD: Normocephalic, atraumatic.  EYES: Pupils equal, round, reactive to light.  No scleral icterus.  MOUTH: Moist mucosal membrane. NECK: Supple. No thyromegaly. No nodules. No JVD.  PULMONARY: bilateral ckrackles diminshed bs CARDIOVASCULAR: S1 and S2. Regular rate and rhythm. No murmurs, rubs, or gallops.  GASTROINTESTINAL: Soft, nontender, non-distended. No masses. Positive bowel sounds. No hepatosplenomegaly.  MUSCULOSKELETAL: No swelling, clubbing, or edema.  NEUROLOGIC: GCS 8  SKIN:intact,warm,dry   PERTINENT DATA     Infusions: . sodium chloride    . sodium chloride    . lactated ringers    . lactated ringers 150 mL/hr at 23-May-2020 1544  . vancomycin     Scheduled Medications: . pantoprazole (PROTONIX) IV  40 mg  Intravenous QHS   PRN Medications: docusate sodium, polyethylene glycol Hemodynamic parameters:   Intake/Output: No intake/output data recorded.  Ventilator  Settings:      LAB RESULTS:  Basic Metabolic Panel: Recent Labs  Lab 05-23-20 0328 05-23-20 1340  NA 129* 132*  K 4.9 5.5*  CL 102 101  CO2 14* 10*  GLUCOSE 118* <20*  BUN 24* 28*  CREATININE 0.90 1.09*  CALCIUM 8.7* 9.0   Liver Function Tests: Recent Labs  Lab 05/23/2020 0328  AST 65*  ALT 84*  ALKPHOS 122  BILITOT 1.2  PROT 6.5  ALBUMIN 3.1*   No results for input(s): LIPASE, AMYLASE in the last 168 hours. No results for input(s): AMMONIA in the last 168 hours. CBC: Recent Labs  Lab May 23, 2020 0328 2020/05/23 1340  WBC 10.0 12.8*  NEUTROABS 8.2* 10.8*  HGB 11.6* 11.7*  HCT 37.7 40.6  MCV 79.2* 83.0  PLT 253 276   Cardiac Enzymes: No results for input(s): CKTOTAL, CKMB, CKMBINDEX, TROPONINI in the last 168 hours. BNP: Invalid input(s): POCBNP CBG: Recent Labs  Lab 2020-05-23 1350 2020/05/23 1400 05/23/2020 1422 May 23, 2020 1441 05/23/20 1551  GLUCAP <10* 281* 159* 161* 147*       IMAGING RESULTS:  Imaging: DG Chest 2 View  Result Date: May 23, 2020 CLINICAL DATA:  Shortness of breath EXAM: CHEST - 2 VIEW COMPARISON:  04/24/2020 FINDINGS: Small pleural effusions. Right basilar opacities. Mild cardiomegaly. No pneumothorax. IMPRESSION: Small bilateral pleural effusions with right basilar opacities, possibly pneumonia or atelectasis. Electronically Signed   By: Deatra Robinson M.D.   On: 05/23/20 03:51   CT Head Wo Contrast  Result Date: 2020/05/23 CLINICAL DATA:  Altered mental status EXAM: CT HEAD WITHOUT CONTRAST TECHNIQUE: Contiguous axial images were obtained from the base of the skull through the vertex without intravenous contrast. COMPARISON:  January 13, 2018 head CT; brain MRI February 12, 2018 FINDINGS: Brain: There is a degree of stable age related volume loss. Right lateral ventricle is  slightly larger than the left lateral ventricle, likely due to encephalomalacia based on prior infarcts on the right. There is no evident intracranial mass, hemorrhage, extra-axial fluid collection, or midline shift. There is evidence of a sizable right inferior to mid frontal and anterior in the mid right temporal  lobe infarct, stable. Elsewhere, there is patchy small vessel disease in the centra semiovale bilaterally. No acute infarct is demonstrable. Vascular: No hyperdense vessel. There is mild calcification in the carotid siphon regions. Skull: Bony calvarium appears intact. Sinuses/Orbits: Visualized paranasal sinuses are clear. There is rightward deviation of the nasal septum. Orbits appear symmetric bilaterally. Other: Visualized mastoid air cells are clear. There is debris in the right external auditory canal. IMPRESSION: Prior right frontal and temporal lobe infarcts, stable. Age related volume loss with patchy periventricular small vessel disease is stable. No acute infarct. No mass or hemorrhage. There is mild arterial vascular calcification. There is rightward deviation of the nasal septum. There is probable cerumen in the right external auditory canal. Electronically Signed   By: Bretta Bang III M.D.   On: 05/22/2020 13:45   CT Angio Chest PE W and/or Wo Contrast  Result Date: 05/07/2020 CLINICAL DATA:  Shortness of breath, acute generalized abdominal pain. EXAM: CT ANGIOGRAPHY CHEST CT ABDOMEN AND PELVIS WITH CONTRAST TECHNIQUE: Multidetector CT imaging of the chest was performed using the standard protocol during bolus administration of intravenous contrast. Multiplanar CT image reconstructions and MIPs were obtained to evaluate the vascular anatomy. Multidetector CT imaging of the abdomen and pelvis was performed using the standard protocol during bolus administration of intravenous contrast. CONTRAST:  75 mL of Omnipaque 350 intravenously. COMPARISON:  March 28, 2020. FINDINGS: CTA  CHEST FINDINGS Cardiovascular: Satisfactory opacification of the pulmonary arteries to the segmental level. No evidence of pulmonary embolism. Moderate cardiomegaly is noted. Moderate left atrial enlargement is noted. Atherosclerosis of thoracic aorta is noted without aneurysm formation. No pericardial effusion. Mediastinum/Nodes: No enlarged mediastinal, hilar, or axillary lymph nodes. Thyroid gland, trachea, and esophagus demonstrate no significant findings. Lungs/Pleura: Large right pleural effusion is noted which appears to be loculated. Moderate left pleural effusion is noted. Atelectasis of both lower lobes is noted. No pneumothorax is noted. Musculoskeletal: No chest wall abnormality. No acute or significant osseous findings. Review of the MIP images confirms the above findings. CT ABDOMEN and PELVIS FINDINGS Hepatobiliary: Status post cholecystectomy. No biliary dilatation is noted. Stable left hepatic cysts are noted. Pancreas: Unremarkable. No pancreatic ductal dilatation or surrounding inflammatory changes. Spleen: Normal in size without focal abnormality. Adrenals/Urinary Tract: Adrenal glands are unremarkable. Kidneys are normal, without renal calculi, focal lesion, or hydronephrosis. Bladder is unremarkable. Stomach/Bowel: The stomach appears normal. There is no evidence of bowel obstruction or inflammation. Ostomy is seen in the right lower quadrant. Status post subtotal colectomy. Vascular/Lymphatic: Aortic atherosclerosis. No enlarged abdominal or pelvic lymph nodes. Reproductive: Uterus and bilateral adnexa are unremarkable. Other: Mild ascites is noted in the abdomen and pelvis. No hernia is noted. Musculoskeletal: No acute or significant osseous findings. Review of the MIP images confirms the above findings. IMPRESSION: 1. No definite evidence of pulmonary embolus. 2. Large right pleural effusion is noted which appears to be loculated. 3. Moderate left pleural effusion is noted. 4. Atelectasis of  both lower lobes is noted. 5. Mild ascites is noted in the abdomen and pelvis. 6. Status post subtotal colectomy. Ostomy is seen in the right lower quadrant. 7. Aortic atherosclerosis. Aortic Atherosclerosis (ICD10-I70.0). Electronically Signed   By: Lupita Raider M.D.   On: 05/25/2020 13:53   CT ABDOMEN PELVIS W CONTRAST  Result Date: 05/11/2020 CLINICAL DATA:  Shortness of breath, acute generalized abdominal pain. EXAM: CT ANGIOGRAPHY CHEST CT ABDOMEN AND PELVIS WITH CONTRAST TECHNIQUE: Multidetector CT imaging of the chest was performed using the standard  protocol during bolus administration of intravenous contrast. Multiplanar CT image reconstructions and MIPs were obtained to evaluate the vascular anatomy. Multidetector CT imaging of the abdomen and pelvis was performed using the standard protocol during bolus administration of intravenous contrast. CONTRAST:  75 mL of Omnipaque 350 intravenously. COMPARISON:  March 28, 2020. FINDINGS: CTA CHEST FINDINGS Cardiovascular: Satisfactory opacification of the pulmonary arteries to the segmental level. No evidence of pulmonary embolism. Moderate cardiomegaly is noted. Moderate left atrial enlargement is noted. Atherosclerosis of thoracic aorta is noted without aneurysm formation. No pericardial effusion. Mediastinum/Nodes: No enlarged mediastinal, hilar, or axillary lymph nodes. Thyroid gland, trachea, and esophagus demonstrate no significant findings. Lungs/Pleura: Large right pleural effusion is noted which appears to be loculated. Moderate left pleural effusion is noted. Atelectasis of both lower lobes is noted. No pneumothorax is noted. Musculoskeletal: No chest wall abnormality. No acute or significant osseous findings. Review of the MIP images confirms the above findings. CT ABDOMEN and PELVIS FINDINGS Hepatobiliary: Status post cholecystectomy. No biliary dilatation is noted. Stable left hepatic cysts are noted. Pancreas: Unremarkable. No pancreatic  ductal dilatation or surrounding inflammatory changes. Spleen: Normal in size without focal abnormality. Adrenals/Urinary Tract: Adrenal glands are unremarkable. Kidneys are normal, without renal calculi, focal lesion, or hydronephrosis. Bladder is unremarkable. Stomach/Bowel: The stomach appears normal. There is no evidence of bowel obstruction or inflammation. Ostomy is seen in the right lower quadrant. Status post subtotal colectomy. Vascular/Lymphatic: Aortic atherosclerosis. No enlarged abdominal or pelvic lymph nodes. Reproductive: Uterus and bilateral adnexa are unremarkable. Other: Mild ascites is noted in the abdomen and pelvis. No hernia is noted. Musculoskeletal: No acute or significant osseous findings. Review of the MIP images confirms the above findings. IMPRESSION: 1. No definite evidence of pulmonary embolus. 2. Large right pleural effusion is noted which appears to be loculated. 3. Moderate left pleural effusion is noted. 4. Atelectasis of both lower lobes is noted. 5. Mild ascites is noted in the abdomen and pelvis. 6. Status post subtotal colectomy. Ostomy is seen in the right lower quadrant. 7. Aortic atherosclerosis. Aortic Atherosclerosis (ICD10-I70.0). Electronically Signed   By: Lupita RaiderJames  Green Jr M.D.   On: 05/30/2020 13:53   DG Chest Port 1 View  Result Date: 05/21/2020 CLINICAL DATA:  Short of breath.  Reported recent thoracentesis. EXAM: PORTABLE CHEST 1 VIEW COMPARISON:  05/18/2020 at 3:25 a.m., and older studies. FINDINGS: Moderate bilateral pleural effusions obscure the hemidiaphragms and portions of the heart borders, unchanged from the earlier study. There is additional opacity at the lung bases consistent with atelectasis. No convincing pulmonary edema.  No pneumothorax. No mediastinal or hilar masses. IMPRESSION: 1. No interval change from the study obtained earlier today. 2. Moderate bilateral pleural effusions with associated lung base opacity, latter finding consistent with  atelectasis. Consider pneumonia if there are consistent clinical findings. No convincing pulmonary edema. Electronically Signed   By: Amie Portlandavid  Ormond M.D.   On: 05/25/2020 13:18   @PROBHOSP @ DG Chest 2 View  Result Date: 05/19/2020 CLINICAL DATA:  Shortness of breath EXAM: CHEST - 2 VIEW COMPARISON:  04/24/2020 FINDINGS: Small pleural effusions. Right basilar opacities. Mild cardiomegaly. No pneumothorax. IMPRESSION: Small bilateral pleural effusions with right basilar opacities, possibly pneumonia or atelectasis. Electronically Signed   By: Deatra RobinsonKevin  Herman M.D.   On: 05/31/2020 03:51   CT Head Wo Contrast  Result Date: 05/20/2020 CLINICAL DATA:  Altered mental status EXAM: CT HEAD WITHOUT CONTRAST TECHNIQUE: Contiguous axial images were obtained from the base of the skull through the vertex  without intravenous contrast. COMPARISON:  January 13, 2018 head CT; brain MRI February 12, 2018 FINDINGS: Brain: There is a degree of stable age related volume loss. Right lateral ventricle is slightly larger than the left lateral ventricle, likely due to encephalomalacia based on prior infarcts on the right. There is no evident intracranial mass, hemorrhage, extra-axial fluid collection, or midline shift. There is evidence of a sizable right inferior to mid frontal and anterior in the mid right temporal lobe infarct, stable. Elsewhere, there is patchy small vessel disease in the centra semiovale bilaterally. No acute infarct is demonstrable. Vascular: No hyperdense vessel. There is mild calcification in the carotid siphon regions. Skull: Bony calvarium appears intact. Sinuses/Orbits: Visualized paranasal sinuses are clear. There is rightward deviation of the nasal septum. Orbits appear symmetric bilaterally. Other: Visualized mastoid air cells are clear. There is debris in the right external auditory canal. IMPRESSION: Prior right frontal and temporal lobe infarcts, stable. Age related volume loss with patchy  periventricular small vessel disease is stable. No acute infarct. No mass or hemorrhage. There is mild arterial vascular calcification. There is rightward deviation of the nasal septum. There is probable cerumen in the right external auditory canal. Electronically Signed   By: Bretta Bang III M.D.   On: 05-17-2020 13:45   CT Angio Chest PE W and/or Wo Contrast  Result Date: 05-17-2020 CLINICAL DATA:  Shortness of breath, acute generalized abdominal pain. EXAM: CT ANGIOGRAPHY CHEST CT ABDOMEN AND PELVIS WITH CONTRAST TECHNIQUE: Multidetector CT imaging of the chest was performed using the standard protocol during bolus administration of intravenous contrast. Multiplanar CT image reconstructions and MIPs were obtained to evaluate the vascular anatomy. Multidetector CT imaging of the abdomen and pelvis was performed using the standard protocol during bolus administration of intravenous contrast. CONTRAST:  75 mL of Omnipaque 350 intravenously. COMPARISON:  March 28, 2020. FINDINGS: CTA CHEST FINDINGS Cardiovascular: Satisfactory opacification of the pulmonary arteries to the segmental level. No evidence of pulmonary embolism. Moderate cardiomegaly is noted. Moderate left atrial enlargement is noted. Atherosclerosis of thoracic aorta is noted without aneurysm formation. No pericardial effusion. Mediastinum/Nodes: No enlarged mediastinal, hilar, or axillary lymph nodes. Thyroid gland, trachea, and esophagus demonstrate no significant findings. Lungs/Pleura: Large right pleural effusion is noted which appears to be loculated. Moderate left pleural effusion is noted. Atelectasis of both lower lobes is noted. No pneumothorax is noted. Musculoskeletal: No chest wall abnormality. No acute or significant osseous findings. Review of the MIP images confirms the above findings. CT ABDOMEN and PELVIS FINDINGS Hepatobiliary: Status post cholecystectomy. No biliary dilatation is noted. Stable left hepatic cysts are  noted. Pancreas: Unremarkable. No pancreatic ductal dilatation or surrounding inflammatory changes. Spleen: Normal in size without focal abnormality. Adrenals/Urinary Tract: Adrenal glands are unremarkable. Kidneys are normal, without renal calculi, focal lesion, or hydronephrosis. Bladder is unremarkable. Stomach/Bowel: The stomach appears normal. There is no evidence of bowel obstruction or inflammation. Ostomy is seen in the right lower quadrant. Status post subtotal colectomy. Vascular/Lymphatic: Aortic atherosclerosis. No enlarged abdominal or pelvic lymph nodes. Reproductive: Uterus and bilateral adnexa are unremarkable. Other: Mild ascites is noted in the abdomen and pelvis. No hernia is noted. Musculoskeletal: No acute or significant osseous findings. Review of the MIP images confirms the above findings. IMPRESSION: 1. No definite evidence of pulmonary embolus. 2. Large right pleural effusion is noted which appears to be loculated. 3. Moderate left pleural effusion is noted. 4. Atelectasis of both lower lobes is noted. 5. Mild ascites is noted in the  abdomen and pelvis. 6. Status post subtotal colectomy. Ostomy is seen in the right lower quadrant. 7. Aortic atherosclerosis. Aortic Atherosclerosis (ICD10-I70.0). Electronically Signed   By: Lupita Raider M.D.   On: 05/29/2020 13:53   CT ABDOMEN PELVIS W CONTRAST  Result Date: 05/25/2020 CLINICAL DATA:  Shortness of breath, acute generalized abdominal pain. EXAM: CT ANGIOGRAPHY CHEST CT ABDOMEN AND PELVIS WITH CONTRAST TECHNIQUE: Multidetector CT imaging of the chest was performed using the standard protocol during bolus administration of intravenous contrast. Multiplanar CT image reconstructions and MIPs were obtained to evaluate the vascular anatomy. Multidetector CT imaging of the abdomen and pelvis was performed using the standard protocol during bolus administration of intravenous contrast. CONTRAST:  75 mL of Omnipaque 350 intravenously. COMPARISON:   March 28, 2020. FINDINGS: CTA CHEST FINDINGS Cardiovascular: Satisfactory opacification of the pulmonary arteries to the segmental level. No evidence of pulmonary embolism. Moderate cardiomegaly is noted. Moderate left atrial enlargement is noted. Atherosclerosis of thoracic aorta is noted without aneurysm formation. No pericardial effusion. Mediastinum/Nodes: No enlarged mediastinal, hilar, or axillary lymph nodes. Thyroid gland, trachea, and esophagus demonstrate no significant findings. Lungs/Pleura: Large right pleural effusion is noted which appears to be loculated. Moderate left pleural effusion is noted. Atelectasis of both lower lobes is noted. No pneumothorax is noted. Musculoskeletal: No chest wall abnormality. No acute or significant osseous findings. Review of the MIP images confirms the above findings. CT ABDOMEN and PELVIS FINDINGS Hepatobiliary: Status post cholecystectomy. No biliary dilatation is noted. Stable left hepatic cysts are noted. Pancreas: Unremarkable. No pancreatic ductal dilatation or surrounding inflammatory changes. Spleen: Normal in size without focal abnormality. Adrenals/Urinary Tract: Adrenal glands are unremarkable. Kidneys are normal, without renal calculi, focal lesion, or hydronephrosis. Bladder is unremarkable. Stomach/Bowel: The stomach appears normal. There is no evidence of bowel obstruction or inflammation. Ostomy is seen in the right lower quadrant. Status post subtotal colectomy. Vascular/Lymphatic: Aortic atherosclerosis. No enlarged abdominal or pelvic lymph nodes. Reproductive: Uterus and bilateral adnexa are unremarkable. Other: Mild ascites is noted in the abdomen and pelvis. No hernia is noted. Musculoskeletal: No acute or significant osseous findings. Review of the MIP images confirms the above findings. IMPRESSION: 1. No definite evidence of pulmonary embolus. 2. Large right pleural effusion is noted which appears to be loculated. 3. Moderate left pleural  effusion is noted. 4. Atelectasis of both lower lobes is noted. 5. Mild ascites is noted in the abdomen and pelvis. 6. Status post subtotal colectomy. Ostomy is seen in the right lower quadrant. 7. Aortic atherosclerosis. Aortic Atherosclerosis (ICD10-I70.0). Electronically Signed   By: Lupita Raider M.D.   On: 05/17/2020 13:53   DG Chest Port 1 View  Result Date: 05/14/2020 CLINICAL DATA:  Short of breath.  Reported recent thoracentesis. EXAM: PORTABLE CHEST 1 VIEW COMPARISON:  05/16/2020 at 3:25 a.m., and older studies. FINDINGS: Moderate bilateral pleural effusions obscure the hemidiaphragms and portions of the heart borders, unchanged from the earlier study. There is additional opacity at the lung bases consistent with atelectasis. No convincing pulmonary edema.  No pneumothorax. No mediastinal or hilar masses. IMPRESSION: 1. No interval change from the study obtained earlier today. 2. Moderate bilateral pleural effusions with associated lung base opacity, latter finding consistent with atelectasis. Consider pneumonia if there are consistent clinical findings. No convincing pulmonary edema. Electronically Signed   By: Amie Portland M.D.   On: 05/16/2020 13:18     ASSESSMENT AND PLAN    -Multidisciplinary rounds held today  Circulatory shock  -  present on admission   - due to bilateral multifocal pneumonia vs CHF exacerbation   - for thoracentesis - fluid studies    -empiric abx -Vanc/zosyn - pharm consult   -IVF   -currently on peripheral low dose levophed -wean off if possible   - blood cultures   - resp cultures when able    -RVP, strep ur ag, legionella, histoplasma, MRSA pcr   -random cortisol level   - procal trend- currently at <0.1  - BNP >1760-  Patient had normal EF and diastology TTE on 12/2019, however signficant valvular disease. Will repeat TTE and consider cardio input.    Bilateral pleural effusions   - patient had similar findings 2 wks ago, had thoracentesis 9d ago  with lymphocytic predominant transudate profile suggestive of non-infectious etiology , cytology was negative and non acidic pH.   -suspect this effusion is simila to previous    Severe hypoglycemia    - POC gluc <20    - repleted - mentation improved    Supratherapeutic INR    - discussed with ER doc , already has FFP ordered 2 units    Renal Failure-Acute stage 2 -most likely due to ATN -follow chem 7 -follow UO -continue Foley Catheter-assess need daily   ID -continue IV abx as prescibed -follow up cultures  GI/Nutrition GI PROPHYLAXIS as indicated DIET-->TF's as tolerated Constipation protocol as indicated  ENDO - ICU hypoglycemic\Hyperglycemia protocol -check FSBS per protocol   ELECTROLYTES -follow labs as needed -replace as needed -pharmacy consultation   DVT/GI PRX ordered -SCDs  TRANSFUSIONS AS NEEDED MONITOR FSBS ASSESS the need for LABS as needed   Critical care provider statement:    Critical care time (minutes):  109   Critical care time was exclusive of:  Separately billable procedures and treating other patients   Critical care was necessary to treat or prevent imminent or life-threatening deterioration of the following conditions:  circulatory shock, hypoglycemia, altered mental status, AKI, hyponatremia   Critical care was time spent personally by me on the following activities:  Development of treatment plan with patient or surrogate, discussions with consultants, evaluation of patient's response to treatment, examination of patient, obtaining history from patient or surrogate, ordering and performing treatments and interventions, ordering and review of laboratory studies and re-evaluation of patient's condition.  I assumed direction of critical care for this patient from another provider in my specialty: no    This document was prepared using Dragon voice recognition software and may include unintentional dictation errors.    Vida Rigger, M.D.  Division of Pulmonary & Critical Care Medicine  [facility]

## 2020-05-03 NOTE — ED Notes (Signed)
Ambulated patient, O2 sats remained at 95%. Patients tachycardia remained at 107, did not increase.

## 2020-05-03 NOTE — ED Notes (Signed)
Difficult stick. Successful IV placement. No blood return. Lab called.

## 2020-05-03 NOTE — Progress Notes (Signed)
Patient admitted to the ICU. Patient alert, able to answer where she is at, date and situation but lethargic. Patient on room air with abdominal breathing. NPO. Ileostomy intact. Titrated levophed to maintain map of 65. Rectal temperature taken 93.7. Dr. Mervyn Skeeters into evaluate patient. Warming blanket ordered. 1 unit of FFP pending.  Dr. Mervyn Skeeters attempted to contact patients son, left messages. Report given to Ann/Nialley RN.

## 2020-05-03 NOTE — Consult Note (Addendum)
PHARMACY -  BRIEF ANTIBIOTIC NOTE   Pharmacy has received consult(s) for Vancomycin and Cefepime dosing from an ED provider.  The patient's profile has been reviewed for ht/wt/allergies/indication/available labs.    One time order(s) placed for : Vancomycin 1.5g (1g; followed by 500mg ) loading dose (~22mg /kg) Cefepime 2g x1  Further antibiotics/pharmacy consults should be ordered by admitting physician if indicated.                       Thank you, 2020/05/07  2:13 PM

## 2020-05-03 NOTE — ED Notes (Signed)
Lab to add on blood work to previous labs sent.

## 2020-05-03 NOTE — ED Notes (Signed)
Two nurses signed Blood Consent due to patient being unresponsive

## 2020-05-04 ENCOUNTER — Inpatient Hospital Stay
Admit: 2020-05-04 | Discharge: 2020-05-04 | Disposition: A | Discharge status: Home / Self Care | Payer: Medicare Other | Attending: Pulmonary Disease | Admitting: Pulmonary Disease

## 2020-05-04 ENCOUNTER — Inpatient Hospital Stay: Payer: Medicare Other

## 2020-05-04 DIAGNOSIS — R570 Cardiogenic shock: Secondary | ICD-10-CM | POA: Diagnosis not present

## 2020-05-04 DIAGNOSIS — I272 Pulmonary hypertension, unspecified: Secondary | ICD-10-CM | POA: Diagnosis not present

## 2020-05-04 DIAGNOSIS — Z7189 Other specified counseling: Secondary | ICD-10-CM

## 2020-05-04 DIAGNOSIS — J90 Pleural effusion, not elsewhere classified: Secondary | ICD-10-CM | POA: Diagnosis not present

## 2020-05-04 DIAGNOSIS — Z515 Encounter for palliative care: Secondary | ICD-10-CM

## 2020-05-04 LAB — BLOOD GAS, ARTERIAL
Acid-base deficit: 5.8 mmol/L — ABNORMAL HIGH (ref 0.0–2.0)
Bicarbonate: 17.3 mmol/L — ABNORMAL LOW (ref 20.0–28.0)
FIO2: 0.36
O2 Saturation: 98.6 %
Patient temperature: 37
pCO2 arterial: 26 mmHg — ABNORMAL LOW (ref 32.0–48.0)
pH, Arterial: 7.43 (ref 7.350–7.450)
pO2, Arterial: 115 mmHg — ABNORMAL HIGH (ref 83.0–108.0)

## 2020-05-04 LAB — BASIC METABOLIC PANEL
Anion gap: 25 — ABNORMAL HIGH (ref 5–15)
BUN: 31 mg/dL — ABNORMAL HIGH (ref 8–23)
CO2: 12 mmol/L — ABNORMAL LOW (ref 22–32)
Calcium: 9 mg/dL (ref 8.9–10.3)
Chloride: 99 mmol/L (ref 98–111)
Creatinine, Ser: 1.29 mg/dL — ABNORMAL HIGH (ref 0.44–1.00)
GFR, Estimated: 43 mL/min — ABNORMAL LOW (ref >60)
Glucose, Bld: 91 mg/dL (ref 70–99)
Potassium: 5 mmol/L (ref 3.5–5.1)
Sodium: 136 mmol/L (ref 135–145)

## 2020-05-04 LAB — GLUCOSE, CAPILLARY
Glucose-Capillary: 105 mg/dL — ABNORMAL HIGH (ref 70–99)
Glucose-Capillary: 96 mg/dL (ref 70–99)
Glucose-Capillary: 71 mg/dL (ref 70–99)
Glucose-Capillary: 102 mg/dL — ABNORMAL HIGH (ref 70–99)
Glucose-Capillary: 110 mg/dL — ABNORMAL HIGH (ref 70–99)
Glucose-Capillary: 100 mg/dL — ABNORMAL HIGH (ref 70–99)
Glucose-Capillary: 116 mg/dL — ABNORMAL HIGH (ref 70–99)
Glucose-Capillary: 145 mg/dL — ABNORMAL HIGH (ref 70–99)
Glucose-Capillary: 133 mg/dL — ABNORMAL HIGH (ref 70–99)

## 2020-05-04 LAB — CBC
HCT: 38.8 % (ref 36.0–46.0)
Hemoglobin: 11.5 g/dL — ABNORMAL LOW (ref 12.0–15.0)
MCH: 24.6 pg — ABNORMAL LOW (ref 26.0–34.0)
MCHC: 29.6 g/dL — ABNORMAL LOW (ref 30.0–36.0)
MCV: 83.1 fL (ref 80.0–100.0)
Platelets: 224 10*3/uL (ref 150–400)
RBC: 4.67 MIL/uL (ref 3.87–5.11)
RDW: 17.2 % — ABNORMAL HIGH (ref 11.5–15.5)
WBC: 19.2 10*3/uL — ABNORMAL HIGH (ref 4.0–10.5)
nRBC: 1.8 % — ABNORMAL HIGH (ref 0.0–0.2)

## 2020-05-04 LAB — ECHOCARDIOGRAM COMPLETE
AR max vel: 1.2 cm2
AV Area VTI: 0.86 cm2
AV Area mean vel: 1 cm2
AV Mean grad: 12 mmHg
AV Peak grad: 20.1 mmHg
Ao pk vel: 2.24 m/s
Area-P 1/2: 4.97 cm2
Height: 65 in
S' Lateral: 3.38 cm
Weight: 2546.75 oz

## 2020-05-04 LAB — PREPARE FRESH FROZEN PLASMA
Unit division: 0
Unit division: 0

## 2020-05-04 LAB — PROTEIN, PLEURAL OR PERITONEAL FLUID: Total protein, fluid: <3 g/dL

## 2020-05-04 LAB — MAGNESIUM: Magnesium: 2.1 mg/dL (ref 1.7–2.4)

## 2020-05-04 LAB — BPAM FFP
Blood Product Expiration Date: 202112072359
Blood Product Expiration Date: 202112072359
ISSUE DATE / TIME: 202112021720
ISSUE DATE / TIME: 202112022145
Unit Type and Rh: 5100
Unit Type and Rh: 9500

## 2020-05-04 LAB — ALBUMIN, PLEURAL OR PERITONEAL FLUID: Albumin, Fluid: 1.3 g/dL

## 2020-05-04 LAB — PROCALCITONIN: Procalcitonin: 2.01 ng/mL

## 2020-05-04 LAB — PROTIME-INR
INR: 3.6 — ABNORMAL HIGH (ref 0.8–1.2)
INR: 3.5 — ABNORMAL HIGH (ref 0.8–1.2)
Prothrombin Time: 34.6 seconds — ABNORMAL HIGH (ref 11.4–15.2)
Prothrombin Time: 34.4 seconds — ABNORMAL HIGH (ref 11.4–15.2)

## 2020-05-04 LAB — LACTATE DEHYDROGENASE, PLEURAL OR PERITONEAL FLUID: LD, Fluid: 90 U/L — ABNORMAL HIGH (ref 3–23)

## 2020-05-04 LAB — GLUCOSE, PLEURAL OR PERITONEAL FLUID: Glucose, Fluid: 121 mg/dL

## 2020-05-04 LAB — LACTIC ACID, PLASMA
Lactic Acid, Venous: >11 mmol/L (ref 0.5–1.9)
Lactic Acid, Venous: >11 mmol/L (ref 0.5–1.9)
Lactic Acid, Venous: >11 mmol/L (ref 0.5–1.9)

## 2020-05-04 LAB — MRSA PCR SCREENING: MRSA by PCR: NEGATIVE

## 2020-05-04 LAB — CORTISOL: Cortisol, Plasma: >100 ug/dL

## 2020-05-04 LAB — APTT: aPTT: 35 seconds (ref 24–36)

## 2020-05-04 LAB — FIBRINOGEN: Fibrinogen: 248 mg/dL (ref 210–475)

## 2020-05-04 LAB — FIBRIN DERIVATIVES D-DIMER (ARMC ONLY): Fibrin derivatives D-dimer (ARMC): 6469.22 ng/mL (FEU) — ABNORMAL HIGH (ref 0.00–499.00)

## 2020-05-04 LAB — PHOSPHORUS: Phosphorus: 5.4 mg/dL — ABNORMAL HIGH (ref 2.5–4.6)

## 2020-05-04 LAB — STREP PNEUMONIAE URINARY ANTIGEN: Strep Pneumo Urinary Antigen: NEGATIVE

## 2020-05-04 MED ORDER — GLYCOPYRROLATE 0.2 MG/ML IJ SOLN: 0.2000 mg | INTRAMUSCULAR | Status: DC | PRN

## 2020-05-04 MED ORDER — ACETAMINOPHEN 325 MG PO TABS: 650.0000 mg | ORAL_TABLET | Freq: Four times a day (QID) | ORAL | Status: DC | PRN

## 2020-05-04 MED ORDER — FUROSEMIDE 10 MG/ML IJ SOLN
20.0000 mg | Freq: Once | INTRAMUSCULAR | Status: AC
  Administered 2020-05-04: 20 mg via INTRAVENOUS
  Filled 2020-05-04: qty 2

## 2020-05-04 MED ORDER — MORPHINE SULFATE (PF) 2 MG/ML IV SOLN
1.0000 mg | INTRAVENOUS | Status: DC | PRN
  Administered 2020-05-04: 2 mg via INTRAVENOUS

## 2020-05-04 MED ORDER — DIPHENHYDRAMINE HCL 50 MG/ML IJ SOLN: 25.0000 mg | INTRAMUSCULAR | Status: DC | PRN

## 2020-05-04 MED ORDER — POLYVINYL ALCOHOL 1.4 % OP SOLN
1.0000 [drp] | Freq: Four times a day (QID) | OPHTHALMIC | Status: DC | PRN
  Filled 2020-05-04: qty 15

## 2020-05-04 MED ORDER — MIDAZOLAM HCL 2 MG/2ML IJ SOLN
2.0000 mg | Freq: Once | INTRAMUSCULAR | Status: DC | PRN
  Filled 2020-05-04: qty 2

## 2020-05-04 MED ORDER — MORPHINE SULFATE (PF) 2 MG/ML IV SOLN
INTRAVENOUS | Status: AC
  Filled 2020-05-04: qty 1

## 2020-05-04 MED ORDER — GLYCOPYRROLATE 1 MG PO TABS
1.0000 mg | ORAL_TABLET | ORAL | Status: DC | PRN
  Filled 2020-05-04: qty 1

## 2020-05-04 MED ORDER — LINEZOLID 600 MG/300ML IV SOLN
600.0000 mg | Freq: Two times a day (BID) | INTRAVENOUS | Status: DC
  Administered 2020-05-04: 600 mg via INTRAVENOUS
  Filled 2020-05-04 (×3): qty 300

## 2020-05-04 MED ORDER — ACETAMINOPHEN 650 MG RE SUPP: 650.0000 mg | Freq: Four times a day (QID) | RECTAL | Status: DC | PRN

## 2020-05-04 MED ORDER — VITAMIN K1 10 MG/ML IJ SOLN
10.0000 mg | Freq: Once | INTRAVENOUS | Status: DC
  Filled 2020-05-04: qty 1

## 2020-05-04 MED ORDER — MORPHINE 100MG IN NS 100ML (1MG/ML) PREMIX INFUSION: 1.0000 mg/h | INTRAVENOUS | Status: DC

## 2020-05-04 MED ORDER — FENTANYL CITRATE (PF) 100 MCG/2ML IJ SOLN
100.0000 ug | Freq: Once | INTRAMUSCULAR | Status: DC | PRN
  Filled 2020-05-04 (×2): qty 2

## 2020-05-04 MED ORDER — DEXTROSE 50 % IV SOLN
25.0000 mL | Freq: Once | INTRAVENOUS | Status: AC
  Administered 2020-05-04: 25 mL via INTRAVENOUS
  Filled 2020-05-04: qty 50

## 2020-05-04 MED ORDER — MORPHINE 100MG IN NS 100ML (1MG/ML) PREMIX INFUSION
0.0000 mg/h | INTRAVENOUS | Status: DC
  Administered 2020-05-04: 5 mg/h via INTRAVENOUS
  Administered 2020-05-05: 4 mg/h via INTRAVENOUS
  Filled 2020-05-04 (×2): qty 100

## 2020-05-04 MED ORDER — SODIUM CHLORIDE 0.9% IV SOLUTION: Freq: Once | INTRAVENOUS | Status: AC

## 2020-05-04 MED ORDER — FENTANYL CITRATE (PF) 100 MCG/2ML IJ SOLN
50.0000 ug | Freq: Once | INTRAMUSCULAR | Status: AC
  Administered 2020-05-04: 50 ug via INTRAVENOUS

## 2020-05-04 MED ORDER — MORPHINE BOLUS VIA INFUSION
5.0000 mg | INTRAVENOUS | Status: DC | PRN
  Filled 2020-05-04: qty 5

## 2020-05-04 MED ORDER — DEXTROSE 5 % IV SOLN: INTRAVENOUS | Status: DC

## 2020-05-04 MED ORDER — SODIUM CHLORIDE 0.9 % IV BOLUS
1000.0000 mL | Freq: Once | INTRAVENOUS | Status: AC
  Administered 2020-05-04: 1000 mL via INTRAVENOUS

## 2020-05-04 NOTE — Progress Notes (Signed)
Neuro: intermittently alert, orientation varies throughout the shift, moves frequently in bed by herself Resp: stable on 4L [place], right sided chest tube placed CV: afebrile, vital signs stable, Levophed turned off following chest tube placement-BP has remained stable GIGU: foley/ostomy in place, NPO Skin: petechiae right flank/right hip, healing abdominal surgical wound Social: family/friends visiting today, met with palliative care to discuss options, decided on comfort care transition this evening, all questions and concerns addressed  Events: Right sided chest tube placed at the bedside- 1234m out within the first half hour Family meeting with palliative care, plan to transition to comfort care this evening

## 2020-05-04 NOTE — Progress Notes (Signed)
Follow up - Critical Care Medicine Note  Patient Details:    [name] is an 74 y.o. 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  She has been in general decline for a year and a half with adult failure to thrive.  Presented with circulatory shock, lactic acidosis and presumed bilateral pneumonia with parapneumonic effusions.  She is noted to have severe lactic acidosis, hypoglycemia and hypotension requiring pressors.  Lines, Airways, Drains: Chest Tube 1 Lateral;Right Pleural 14 Fr. (Active)  Status -20 cm H2O 05/04/20 1600  Chest Tube Air Leak Large 05/04/20 1600  Patency Intervention Tip/tilt 05/04/20 1600  Drainage Description Serous;Serosanguineous 05/04/20 1600  Dressing Status Clean;Dry;Intact 05/04/20 1600  Dressing Intervention New dressing 05/04/20 1600  Site Assessment Clean;Intact;Dry 05/04/20 1600  Surrounding Skin Dry;Intact 05/04/20 1600  Output (mL) 1230 mL 05/04/20 1600     Ileostomy Continent (Abdominal pouch) RUQ (Active)  Ostomy Pouch 2 piece;Intact 05/04/20 1600  Stoma Assessment Pink 05/04/20 1600  Peristomal Assessment Intact 05/04/20 1600  Treatment Other (Comment) 04/24/20 1330  Output (mL) 0 mL 05/04/20 1600     Urethral Catheter Vernona Rieger RN (Active)  Indication for Insertion or Continuance of Catheter Unstable critically ill patients first 24-48 hours (See Criteria) 05/04/20 1600  Site Assessment Clean;Intact 05/04/20 1600  Catheter Maintenance Bag below level of bladder;Catheter secured;Drainage bag/tubing not touching floor;Insertion date on drainage bag;No dependent loops;Seal intact 05/04/20 1600  Collection Container Standard drainage bag 05/04/20 1600  Securement Method Securing device (Describe) 05/04/20 1600  Urinary Catheter Interventions (if applicable) Unclamped 05/04/20 1600  Output (mL) 50 mL 05/04/20 1600    Anti-infectives:  Anti-infectives (From admission, onward)    Start     Dose/Rate Route Frequency Ordered Stop   05/04/20 1200  linezolid (ZYVOX) IVPB 600 mg        600 mg 300 mL/hr over 60 Minutes Intravenous Every 12 hours 05/04/20 1053     05/04/20 0800  vancomycin (VANCOREADY) IVPB 500 mg/100 mL  Status:  Discontinued        500 mg 100 mL/hr over 60 Minutes Intravenous Every 12 hours 05/14/2020 1734 05/04/20 0831   05/09/2020 2000  piperacillin-tazobactam (ZOSYN) IVPB 3.375 g        3.375 g 12.5 mL/hr over 240 Minutes Intravenous Every 8 hours 05/20/2020 1745     05/16/2020 1515  vancomycin (VANCOREADY) IVPB 500 mg/100 mL  Status:  Discontinued       "Followed by" Linked Group Details   500 mg 100 mL/hr over 60 Minutes Intravenous  Once 05/13/2020 1413 05/04/20 0829   05/12/2020 1415  ceFEPIme (MAXIPIME) 2 g in sodium chloride 0.9 % 100 mL IVPB        2 g 200 mL/hr over 30 Minutes Intravenous  Once 05/29/2020 1405 05/30/2020 1451   05/24/2020 1415  vancomycin (VANCOCIN) IVPB 1000 mg/200 mL premix  Status:  Discontinued        1,000 mg 200 mL/hr over 60 Minutes Intravenous  Once 05/10/2020 1405 05/23/2020 1413   05/02/2020 1415  vancomycin (VANCOCIN) IVPB 1000 mg/200 mL premix       "Followed by" Linked Group Details   1,000 mg 200 mL/hr over 60 Minutes Intravenous  Once 05/11/2020 1413 05/26/2020 1536     Scheduled Meds: . Chlorhexidine Gluconate Cloth  6 each Topical Daily  . fentaNYL (SUBLIMAZE) injection  50 mcg Intravenous Once  . pantoprazole (PROTONIX) IV  40 mg Intravenous QHS   Continuous Infusions: .  sodium chloride Stopped (05/14/2020 1951)  . sodium chloride Stopped (05/28/2020 1951)  . sodium chloride Stopped (05/24/2020 1949)  . dextrose    . lactated ringers Stopped (05/30/2020 1952)  . linezolid (ZYVOX) IV Stopped (05/04/20 1438)  . morphine    . norepinephrine (LEVOPHED) Adult infusion Stopped (05/04/20 1559)  . phytonadione (VITAMIN K) IV    . piperacillin-tazobactam (ZOSYN)  IV Stopped (05/04/20 0906)  . sodium bicarbonate (isotonic) 150 mEq in D5W  1000 mL infusion 125 mL/hr at 05/04/20 1900   PRN Meds:.acetaminophen **OR** acetaminophen, diphenhydrAMINE, docusate sodium, glycopyrrolate **OR** glycopyrrolate **OR** glycopyrrolate, midazolam, morphine, polyethylene glycol, polyvinyl alcohol  Results for orders placed or performed during the hospital encounter of 05/31/2020 (from the past 24 hour(s))  Strep pneumoniae urinary antigen     Status: None   Collection Time: 05/12/2020  8:00 PM  Result Value Ref Range   Strep Pneumo Urinary Antigen NEGATIVE NEGATIVE  Blood gas, arterial     Status: Abnormal   Collection Time: 05/10/2020  8:30 PM  Result Value Ref Range   pH, Arterial 7.27 (L) 7.35 - 7.45   pCO2 arterial <19.0 (LL) 32 - 48 mmHg   pO2, Arterial 149 (H) 83 - 108 mmHg   Bicarbonate 6.9 (L) 20.0 - 28.0 mmol/L   Acid-base deficit 17.5 (H) 0.0 - 2.0 mmol/L   O2 Saturation 99.0 %   Patient temperature 37.0    Collection site RIGHT RADIAL    Sample type ARTERIAL DRAW    Allens test (pass/fail) PASS PASS  Glucose, capillary     Status: Abnormal   Collection Time: 05/18/2020  9:10 PM  Result Value Ref Range   Glucose-Capillary 69 (L) 70 - 99 mg/dL  Glucose, capillary     Status: Abnormal   Collection Time: 05/21/2020 11:20 PM  Result Value Ref Range   Glucose-Capillary 131 (H) 70 - 99 mg/dL  Lactic acid, plasma     Status: Abnormal   Collection Time: 05/04/20  1:00 AM  Result Value Ref Range   Lactic Acid, Venous >11.0 (HH) 0.5 - 1.9 mmol/L  Glucose, capillary     Status: None   Collection Time: 05/04/20  1:13 AM  Result Value Ref Range   Glucose-Capillary 96 70 - 99 mg/dL  APTT     Status: None   Collection Time: 05/04/20  2:26 AM  Result Value Ref Range   aPTT 35 24 - 36 seconds  Basic metabolic panel     Status: Abnormal   Collection Time: 05/04/20  2:26 AM  Result Value Ref Range   Sodium 136 135 - 145 mmol/L   Potassium 5.0 3.5 - 5.1 mmol/L   Chloride 99 98 - 111 mmol/L   CO2 12 (L) 22 - 32 mmol/L   Glucose, Bld 91 70  - 99 mg/dL   BUN 31 (H) 8 - 23 mg/dL   Creatinine, Ser 3.08 (H) 0.44 - 1.00 mg/dL   Calcium 9.0 8.9 - 65.7 mg/dL   GFR, Estimated 43 (L) >60 mL/min   Anion gap 25 (H) 5 - 15  CBC     Status: Abnormal   Collection Time: 05/04/20  2:26 AM  Result Value Ref Range   WBC 19.2 (H) 4.0 - 10.5 K/uL   RBC 4.67 3.87 - 5.11 MIL/uL   Hemoglobin 11.5 (L) 12.0 - 15.0 g/dL   HCT 84.6 36 - 46 %   MCV 83.1 80.0 - 100.0 fL   MCH 24.6 (L) 26.0 - 34.0 pg  MCHC 29.6 (L) 30.0 - 36.0 g/dL   RDW 45.417.2 (H) 09.811.5 - 11.915.5 %   Platelets 224 150 - 400 K/uL   nRBC 1.8 (H) 0.0 - 0.2 %  Protime-INR     Status: Abnormal   Collection Time: 05/04/20  2:26 AM  Result Value Ref Range   Prothrombin Time 34.6 (H) 11.4 - 15.2 seconds   INR 3.6 (H) 0.8 - 1.2  Fibrinogen     Status: None   Collection Time: 05/04/20  2:26 AM  Result Value Ref Range   Fibrinogen 248 210 - 475 mg/dL  Fibrin derivatives D-Dimer ([facility] only)     Status: Abnormal   Collection Time: 05/04/20  2:26 AM  Result Value Ref Range   Fibrin derivatives D-dimer ([facility]) 1,478.296,469.22 (H) 0.00 - 499.00 ng/mL (FEU)  Glucose, capillary     Status: None   Collection Time: 05/04/20  3:16 AM  Result Value Ref Range   Glucose-Capillary 71 70 - 99 mg/dL  Prepare fresh frozen plasma     Status: None (Preliminary result)   Collection Time: 05/04/20  4:07 AM  Result Value Ref Range   Unit Number [id number]    Blood Component Type THW PLS APHR    Unit division C0    Status of Unit ISSUED    Transfusion Status OK TO TRANSFUSE    Unit Number [id number]    Blood Component Type THW PLS APHR    Unit division A0    Status of Unit ISSUED    Transfusion Status      OK TO TRANSFUSE Performed at [facility], [address] [place], [place] [address]   Glucose, capillary     Status: Abnormal   Collection Time: 05/04/20  5:20 AM  Result Value Ref Range   Glucose-Capillary 102 (H) 70 - 99 mg/dL  Lactic acid, plasma     Status: Abnormal   Collection  Time: 05/04/20  5:32 AM  Result Value Ref Range   Lactic Acid, Venous >11.0 (HH) 0.5 - 1.9 mmol/L  Magnesium     Status: None   Collection Time: 05/04/20  5:55 AM  Result Value Ref Range   Magnesium 2.1 1.7 - 2.4 mg/dL  Phosphorus     Status: Abnormal   Collection Time: 05/04/20  5:55 AM  Result Value Ref Range   Phosphorus 5.4 (H) 2.5 - 4.6 mg/dL  Procalcitonin     Status: None   Collection Time: 05/04/20  5:55 AM  Result Value Ref Range   Procalcitonin 2.01 ng/mL  Glucose, capillary     Status: Abnormal   Collection Time: 05/04/20  7:42 AM  Result Value Ref Range   Glucose-Capillary 110 (H) 70 - 99 mg/dL  Blood gas, arterial     Status: Abnormal   Collection Time: 05/04/20  9:02 AM  Result Value Ref Range   FIO2 0.36    Delivery systems NASAL CANNULA    pH, Arterial 7.43 7.35 - 7.45   pCO2 arterial 26 (L) 32 - 48 mmHg   pO2, Arterial 115 (H) 83 - 108 mmHg   Bicarbonate 17.3 (L) 20.0 - 28.0 mmol/L   Acid-base deficit 5.8 (H) 0.0 - 2.0 mmol/L   O2 Saturation 98.6 %   Patient temperature 37.0    Collection site RIGHT RADIAL    Sample type ARTERIAL DRAW    Allens test (pass/fail) PASS PASS  Glucose, capillary     Status: Abnormal   Collection Time: 05/04/20  9:58 AM  Result  Value Ref Range   Glucose-Capillary 100 (H) 70 - 99 mg/dL  Protime-INR     Status: Abnormal   Collection Time: 05/04/20  9:59 AM  Result Value Ref Range   Prothrombin Time 34.4 (H) 11.4 - 15.2 seconds   INR 3.5 (H) 0.8 - 1.2  Lactic acid, plasma     Status: Abnormal   Collection Time: 05/04/20 10:13 AM  Result Value Ref Range   Lactic Acid, Venous >11.0 (HH) 0.5 - 1.9 mmol/L  Glucose, capillary     Status: Abnormal   Collection Time: 05/04/20 11:51 AM  Result Value Ref Range   Glucose-Capillary 116 (H) 70 - 99 mg/dL  Glucose, capillary     Status: Abnormal   Collection Time: 05/04/20  1:57 PM  Result Value Ref Range   Glucose-Capillary 145 (H) 70 - 99 mg/dL  Glucose, capillary     Status:  Abnormal   Collection Time: 05/04/20  4:00 PM  Result Value Ref Range   Glucose-Capillary 133 (H) 70 - 99 mg/dL  Albumin, pleural or peritoneal fluid     Status: None   Collection Time: 05/04/20  4:05 PM  Result Value Ref Range   Albumin, Fluid 1.3 g/dL   Fluid Type-FALB Pleural R   Glucose, pleural or peritoneal fluid     Status: None   Collection Time: 05/04/20  4:05 PM  Result Value Ref Range   Glucose, Fluid 121 mg/dL   Fluid Type-FGLU Pleural R   Lactate dehydrogenase (pleural or peritoneal fluid)     Status: Abnormal   Collection Time: 05/04/20  4:05 PM  Result Value Ref Range   LD, Fluid 90 (H) 3 - 23 U/L   Fluid Type-FLDH Pleural R   Protein, pleural or peritoneal fluid     Status: None   Collection Time: 05/04/20  4:05 PM  Result Value Ref Range   Total protein, fluid <3.0 g/dL   Fluid Type-FTP Pleural R   MRSA PCR Screening     Status: None   Collection Time: 05/04/20  4:22 PM   Specimen: Nasopharyngeal  Result Value Ref Range   MRSA by PCR NEGATIVE NEGATIVE    Microbiology: Results for orders placed or performed during the hospital encounter of 05/17/2020  Group A Strep by PCR ([facility] Only)     Status: None   Collection Time: 05/13/2020  3:28 AM   Specimen: Nasopharyngeal Swab; Sterile Swab  Result Value Ref Range Status   Group A Strep by PCR NOT DETECTED NOT DETECTED Final    Comment: Performed at [facility], [address] [place], [place] [address]  Resp Panel by RT-PCR (Flu A&B, Covid) Nasopharyngeal Swab     Status: None   Collection Time: 05/17/2020  3:28 AM   Specimen: Nasopharyngeal Swab; Nasopharyngeal(NP) swabs in vial transport medium  Result Value Ref Range Status   SARS Coronavirus 2 by RT PCR NEGATIVE NEGATIVE Final    Comment: (NOTE) SARS-CoV-2 target nucleic acids are NOT DETECTED.  The SARS-CoV-2 RNA is generally detectable in upper respiratory specimens during the acute phase of infection. The lowest concentration of SARS-CoV-2  viral copies this assay can detect is 138 copies/mL. A negative result does not preclude SARS-Cov-2 infection and should not be used as the sole basis for treatment or other patient management decisions. A negative result may occur with  improper specimen collection/handling, submission of specimen other than nasopharyngeal swab, presence of viral mutation(s) within the areas targeted by this assay, and inadequate number of  viral copies(<138 copies/mL). A negative result must be combined with clinical observations, patient history, and epidemiological information. The expected result is Negative.  Fact Sheet for Patients:  BloggerCourse.com  Fact Sheet for Healthcare Providers:  SeriousBroker.it  This test is no t yet approved or cleared by the [place] FDA and  has been authorized for detection and/or diagnosis of SARS-CoV-2 by FDA under an Emergency Use Authorization (EUA). This EUA will remain  in effect (meaning this test can be used) for the duration of the COVID-19 declaration under Section 564(b)(1) of the Act, 21 U.S.C.section 360bbb-3(b)(1), unless the authorization is terminated  or revoked sooner.       Influenza A by PCR NEGATIVE NEGATIVE Final   Influenza B by PCR NEGATIVE NEGATIVE Final    Comment: (NOTE) The Xpert Xpress SARS-CoV-2/FLU/RSV plus assay is intended as an aid in the diagnosis of influenza from Nasopharyngeal swab specimens and should not be used as a sole basis for treatment. Nasal washings and aspirates are unacceptable for Xpert Xpress SARS-CoV-2/FLU/RSV testing.  Fact Sheet for Patients: BloggerCourse.com  Fact Sheet for Healthcare Providers: SeriousBroker.it  This test is not yet approved or cleared by the [place] FDA and has been authorized for detection and/or diagnosis of SARS-CoV-2 by FDA under an Emergency Use Authorization  (EUA). This EUA will remain in effect (meaning this test can be used) for the duration of the COVID-19 declaration under Section 564(b)(1) of the Act, 21 U.S.C. section 360bbb-3(b)(1), unless the authorization is terminated or revoked.  Performed at [facility], [address] [place], [place] [address]   Blood Culture (routine x 2)     Status: None (Preliminary result)   Collection Time: 16-May-2020  2:53 PM   Specimen: BLOOD  Result Value Ref Range Status   Specimen Description BLOOD BLOOD LEFT HAND  Final   Special Requests   Final    BOTTLES DRAWN AEROBIC AND ANAEROBIC Blood Culture adequate volume   Culture   Final    NO GROWTH < 24 HOURS Performed at [facility], [address], [place], [place] [address]    Report Status PENDING  Incomplete  Blood Culture (routine x 2)     Status: None (Preliminary result)   Collection Time: 05/16/2020  3:05 PM   Specimen: BLOOD  Result Value Ref Range Status   Specimen Description BLOOD BLOOD RIGHT HAND  Final   Special Requests   Final    BOTTLES DRAWN AEROBIC AND ANAEROBIC Blood Culture adequate volume   Culture   Final    NO GROWTH < 24 HOURS Performed at [facility], [address] [place], [place] [address]    Report Status PENDING  Incomplete  MRSA PCR Screening     Status: None   Collection Time: 05/04/20  4:22 PM   Specimen: Nasopharyngeal  Result Value Ref Range Status   MRSA by PCR NEGATIVE NEGATIVE Final    Comment:        The GeneXpert MRSA Assay (FDA approved for NASAL specimens only), is one component of a comprehensive MRSA colonization surveillance program. It is not intended to diagnose MRSA infection nor to guide or monitor treatment for MRSA infections. Performed at [facility], [address], [place], [place] [address]     Best Practice/Protocols:  Patient had been on Eliquis currently coagulopathic, no DVT prophylaxis, multiple petechiae, mechanical prophylaxis  contraindicated   Events: 05/16/20: Admitted to shock with bilateral effusions and severe lactic acidosis requiring pressors 05/04/2020:  Chest tube place, RIGHT chest for management of large right effusion.  2D echo shows systolic dysfunction EF 35%, SEVERE pulmonary hypertension AND severe tricuspid insufficiency to it consistent with transudate.  Etiology of shock cardiogenic.  Patient changed to DNR status.  Studies: CT ABDOMEN PELVIS WO CONTRAST  Result Date: 05/04/2020 CLINICAL DATA:  Sepsis pneumonia, worsening lactic acidosis EXAM: CT ABDOMEN AND PELVIS WITHOUT CONTRAST TECHNIQUE: Multidetector CT imaging of the abdomen and pelvis was performed following the standard protocol without IV contrast. Sagittal and coronal MPR images reconstructed from axial data set. No oral contrast administered. Beam hardening artifacts from inclusion of patient's RIGHT arm and imaged field. COMPARISON:  May 22, 2020 FINDINGS: Lower chest: BILATERAL pleural effusions with compressive atelectasis of lower lobes, RIGHT greater than LEFT little changed. Linear scarring RIGHT middle lobe. Hepatobiliary: Gallbladder surgically absent. Low-attenuation foci anteriorly in lateral segment LEFT lobe liver consistent with cysts, stable. No additional hepatic abnormalities. Pancreas: Mildly atrophic without focal mass Spleen: Normal appearance Adrenals/Urinary Tract: Thickening of adrenal glands without mass. Excreted contrast in renal collecting systems. Renal cortical atrophy without mass. No ureteral dilatation. Bladder decompressed by Foley catheter. Stomach/Bowel: Ileostomy RIGHT mid abdomen. Stomach and bowel loops otherwise normal appearance. Vascular/Lymphatic: Enlargement of cardiac chambers. Atherosclerotic calcifications aorta, iliac arteries, femoral arteries. Mitral annular calcification. Aorta normal caliber. No adenopathy. Reproductive: Unremarkable uterus and ovaries Other: Scattered ascites.  No free air.  No  hernia. Musculoskeletal: Osseous demineralization. Scattered subcutaneous edema. IMPRESSION: BILATERAL pleural effusions and compressive atelectasis of lower lobes, RIGHT greater than LEFT, little changed. Scattered ascites. No additional significant intra-abdominal or intrapelvic abnormalities. Aortic Atherosclerosis (ICD10-I70.0). Electronically Signed   By: Ulyses Southward M.D.   On: 05/04/2020 09:59   DG Chest 2 View  Result Date: May 22, 2020 CLINICAL DATA:  Shortness of breath EXAM: CHEST - 2 VIEW COMPARISON:  04/24/2020 FINDINGS: Small pleural effusions. Right basilar opacities. Mild cardiomegaly. No pneumothorax. IMPRESSION: Small bilateral pleural effusions with right basilar opacities, possibly pneumonia or atelectasis. Electronically Signed   By: Deatra Robinson M.D.   On: 05-22-20 03:51   DG Chest 2 View  Result Date: 04/21/2020 CLINICAL DATA:  Shortness of breath, pleural effusion EXAM: CHEST - 2 VIEW COMPARISON:  04/18/2020 FINDINGS: No significant interval change in chest radiographs with moderate, layering bilateral pleural effusions and diffuse interstitial pulmonary opacity. Cardiomegaly. IMPRESSION: No significant interval change in chest radiographs with moderate, layering bilateral pleural effusions and diffuse interstitial pulmonary opacity, likely edema. Electronically Signed   By: Lauralyn Primes M.D.   On: 04/21/2020 15:31   DG Chest 2 View  Result Date: 04/11/2020 CLINICAL DATA:  74 year old female with a history congestive heart failure EXAM: CHEST - 2 VIEW COMPARISON:  04/09/2020, CT chest 03/28/2020 FINDINGS: Cardiomediastinal silhouette unchanged with the heart borders partially obscured by overlying lung and pleural disease. Opacity at the bilateral lung bases partially obscuring the hemidiaphragm and heart borders. No pneumothorax. Opacity at the posterior lung base on the lateral view. Mild interlobular septal thickening. IMPRESSION: Bilateral pleural effusions with associated  atelectasis/consolidation. Electronically Signed   By: Gilmer Mor D.O.   On: 04/11/2020 08:10   CT Head Wo Contrast  Result Date: 2020-05-22 CLINICAL DATA:  Altered mental status EXAM: CT HEAD WITHOUT CONTRAST TECHNIQUE: Contiguous axial images were obtained from the base of the skull through the vertex without intravenous contrast. COMPARISON:  January 13, 2018 head CT; brain MRI February 12, 2018 FINDINGS: Brain: There is a degree of stable age related volume loss. Right lateral ventricle is  slightly larger than the left lateral ventricle, likely due to encephalomalacia based on prior infarcts on the right. There is no evident intracranial mass, hemorrhage, extra-axial fluid collection, or midline shift. There is evidence of a sizable right inferior to mid frontal and anterior in the mid right temporal lobe infarct, stable. Elsewhere, there is patchy small vessel disease in the centra semiovale bilaterally. No acute infarct is demonstrable. Vascular: No hyperdense vessel. There is mild calcification in the carotid siphon regions. Skull: Bony calvarium appears intact. Sinuses/Orbits: Visualized paranasal sinuses are clear. There is rightward deviation of the nasal septum. Orbits appear symmetric bilaterally. Other: Visualized mastoid air cells are clear. There is debris in the right external auditory canal. IMPRESSION: Prior right frontal and temporal lobe infarcts, stable. Age related volume loss with patchy periventricular small vessel disease is stable. No acute infarct. No mass or hemorrhage. There is mild arterial vascular calcification. There is rightward deviation of the nasal septum. There is probable cerumen in the right external auditory canal. Electronically Signed   By: Bretta Bang III M.D.   On: 05/12/2020 13:45   CT Angio Chest PE W and/or Wo Contrast  Result Date: 05/16/2020 CLINICAL DATA:  Shortness of breath, acute generalized abdominal pain. EXAM: CT ANGIOGRAPHY CHEST CT ABDOMEN  AND PELVIS WITH CONTRAST TECHNIQUE: Multidetector CT imaging of the chest was performed using the standard protocol during bolus administration of intravenous contrast. Multiplanar CT image reconstructions and MIPs were obtained to evaluate the vascular anatomy. Multidetector CT imaging of the abdomen and pelvis was performed using the standard protocol during bolus administration of intravenous contrast. CONTRAST:  75 mL of Omnipaque 350 intravenously. COMPARISON:  March 28, 2020. FINDINGS: CTA CHEST FINDINGS Cardiovascular: Satisfactory opacification of the pulmonary arteries to the segmental level. No evidence of pulmonary embolism. Moderate cardiomegaly is noted. Moderate left atrial enlargement is noted. Atherosclerosis of thoracic aorta is noted without aneurysm formation. No pericardial effusion. Mediastinum/Nodes: No enlarged mediastinal, hilar, or axillary lymph nodes. Thyroid gland, trachea, and esophagus demonstrate no significant findings. Lungs/Pleura: Large right pleural effusion is noted which appears to be loculated. Moderate left pleural effusion is noted. Atelectasis of both lower lobes is noted. No pneumothorax is noted. Musculoskeletal: No chest wall abnormality. No acute or significant osseous findings. Review of the MIP images confirms the above findings. CT ABDOMEN and PELVIS FINDINGS Hepatobiliary: Status post cholecystectomy. No biliary dilatation is noted. Stable left hepatic cysts are noted. Pancreas: Unremarkable. No pancreatic ductal dilatation or surrounding inflammatory changes. Spleen: Normal in size without focal abnormality. Adrenals/Urinary Tract: Adrenal glands are unremarkable. Kidneys are normal, without renal calculi, focal lesion, or hydronephrosis. Bladder is unremarkable. Stomach/Bowel: The stomach appears normal. There is no evidence of bowel obstruction or inflammation. Ostomy is seen in the right lower quadrant. Status post subtotal colectomy. Vascular/Lymphatic: Aortic  atherosclerosis. No enlarged abdominal or pelvic lymph nodes. Reproductive: Uterus and bilateral adnexa are unremarkable. Other: Mild ascites is noted in the abdomen and pelvis. No hernia is noted. Musculoskeletal: No acute or significant osseous findings. Review of the MIP images confirms the above findings. IMPRESSION: 1. No definite evidence of pulmonary embolus. 2. Large right pleural effusion is noted which appears to be loculated. 3. Moderate left pleural effusion is noted. 4. Atelectasis of both lower lobes is noted. 5. Mild ascites is noted in the abdomen and pelvis. 6. Status post subtotal colectomy. Ostomy is seen in the right lower quadrant. 7. Aortic atherosclerosis. Aortic Atherosclerosis (ICD10-I70.0). Electronically Signed   By: Lupita Raider  M.D.   On: May 05, 2020 13:53   CT ABDOMEN PELVIS W CONTRAST  Result Date: May 05, 2020 CLINICAL DATA:  Shortness of breath, acute generalized abdominal pain. EXAM: CT ANGIOGRAPHY CHEST CT ABDOMEN AND PELVIS WITH CONTRAST TECHNIQUE: Multidetector CT imaging of the chest was performed using the standard protocol during bolus administration of intravenous contrast. Multiplanar CT image reconstructions and MIPs were obtained to evaluate the vascular anatomy. Multidetector CT imaging of the abdomen and pelvis was performed using the standard protocol during bolus administration of intravenous contrast. CONTRAST:  75 mL of Omnipaque 350 intravenously. COMPARISON:  March 28, 2020. FINDINGS: CTA CHEST FINDINGS Cardiovascular: Satisfactory opacification of the pulmonary arteries to the segmental level. No evidence of pulmonary embolism. Moderate cardiomegaly is noted. Moderate left atrial enlargement is noted. Atherosclerosis of thoracic aorta is noted without aneurysm formation. No pericardial effusion. Mediastinum/Nodes: No enlarged mediastinal, hilar, or axillary lymph nodes. Thyroid gland, trachea, and esophagus demonstrate no significant findings. Lungs/Pleura:  Large right pleural effusion is noted which appears to be loculated. Moderate left pleural effusion is noted. Atelectasis of both lower lobes is noted. No pneumothorax is noted. Musculoskeletal: No chest wall abnormality. No acute or significant osseous findings. Review of the MIP images confirms the above findings. CT ABDOMEN and PELVIS FINDINGS Hepatobiliary: Status post cholecystectomy. No biliary dilatation is noted. Stable left hepatic cysts are noted. Pancreas: Unremarkable. No pancreatic ductal dilatation or surrounding inflammatory changes. Spleen: Normal in size without focal abnormality. Adrenals/Urinary Tract: Adrenal glands are unremarkable. Kidneys are normal, without renal calculi, focal lesion, or hydronephrosis. Bladder is unremarkable. Stomach/Bowel: The stomach appears normal. There is no evidence of bowel obstruction or inflammation. Ostomy is seen in the right lower quadrant. Status post subtotal colectomy. Vascular/Lymphatic: Aortic atherosclerosis. No enlarged abdominal or pelvic lymph nodes. Reproductive: Uterus and bilateral adnexa are unremarkable. Other: Mild ascites is noted in the abdomen and pelvis. No hernia is noted. Musculoskeletal: No acute or significant osseous findings. Review of the MIP images confirms the above findings. IMPRESSION: 1. No definite evidence of pulmonary embolus. 2. Large right pleural effusion is noted which appears to be loculated. 3. Moderate left pleural effusion is noted. 4. Atelectasis of both lower lobes is noted. 5. Mild ascites is noted in the abdomen and pelvis. 6. Status post subtotal colectomy. Ostomy is seen in the right lower quadrant. 7. Aortic atherosclerosis. Aortic Atherosclerosis (ICD10-I70.0). Electronically Signed   By: Lupita Raider M.D.   On: 05/22/2020 13:53   DG Chest Port 1 View  Result Date: 05/04/2020 CLINICAL DATA:  Recurrent right pleural effusion. EXAM: PORTABLE CHEST 1 VIEW COMPARISON:  05/22/2020 FINDINGS: A right  pigtail catheter is been placed on the right in the interval. The right-sided pleural effusions seen on yesterday's chest x-ray is no longer visualized. There is a moderate left-sided pleural effusion, larger in the interval. Possible pulmonary venous congestion/mild edema. No other acute interval changes. IMPRESSION: 1. Placement of a right-sided pigtail catheter. No right-sided effusion seen on today's study. 2. There is a moderate left pleural effusion, larger in the interval. 3. Probable mild edema. Electronically Signed   By: Gerome Sam III M.D   On: 05/04/2020 16:23   DG Chest Port 1 View  Result Date: May 05, 2020 CLINICAL DATA:  Short of breath.  Reported recent thoracentesis. EXAM: PORTABLE CHEST 1 VIEW COMPARISON:  05-May-2020 at 3:25 a.m., and older studies. FINDINGS: Moderate bilateral pleural effusions obscure the hemidiaphragms and portions of the heart borders, unchanged from the earlier study. There is additional  opacity at the lung bases consistent with atelectasis. No convincing pulmonary edema.  No pneumothorax. No mediastinal or hilar masses. IMPRESSION: 1. No interval change from the study obtained earlier today. 2. Moderate bilateral pleural effusions with associated lung base opacity, latter finding consistent with atelectasis. Consider pneumonia if there are consistent clinical findings. No convincing pulmonary edema. Electronically Signed   By: Amie Portland M.D.   On: 05-13-20 13:18   DG Chest Port 1 View  Result Date: 04/24/2020 CLINICAL DATA:  Left thoracentesis. EXAM: PORTABLE CHEST 1 VIEW COMPARISON:  Chest x-ray 04/23/2020. FINDINGS: Cardiomegaly and pulmonary venous congestion again noted. Bilateral interstitial prominence noted. Low lung volumes with bibasilar atelectasis. Small left pleural effusion, improved from prior exam. No evidence of pneumothorax post thoracentesis. IMPRESSION: 1. No evidence of pneumothorax post thoracentesis. 2. Cardiomegaly with pulmonary  venous congestion and bilateral interstitial prominence again noted. Small left pleural effusion, improved from prior exam. Electronically Signed   By: Maisie Fus  Register   On: 04/24/2020 12:29   DG Chest Port 1 View  Result Date: 04/23/2020 CLINICAL DATA:  Status post thoracentesis EXAM: PORTABLE CHEST 1 VIEW COMPARISON:  April 21, 2020 FINDINGS: No pneumothorax. There are fairly small pleural effusions bilaterally with atelectasis and consolidation in the left lower lobe. Heart is mildly enlarged with pulmonary vascularity normal. No adenopathy. No bone lesions. IMPRESSION: No pneumothorax. Fairly small pleural effusions bilaterally with atelectasis and questionable superimposed pneumonia left lower lobe. Stable cardiac silhouette. Electronically Signed   By: Bretta Bang III M.D.   On: 04/23/2020 12:05   DG Chest Portable 1 View  Result Date: 04/18/2020 CLINICAL DATA:  Chest pain and shortness of breath EXAM: PORTABLE CHEST 1 VIEW COMPARISON:  Seven days ago FINDINGS: Small to moderate bilateral pleural effusion. The lower lobes are partially obscured. Possible cephalized blood flow. No Kerley lines. Cardiomegaly. IMPRESSION: Cardiomegaly with small to moderate pleural effusions and mild vascular congestion. Electronically Signed   By: Marnee Spring M.D.   On: 04/18/2020 06:16   DG Chest Port 1 View  Result Date: 04/09/2020 CLINICAL DATA:  Shortness of breath EXAM: PORTABLE CHEST 1 VIEW COMPARISON:  Chest x-ray dated 03/28/2020 FINDINGS: There is cardiomegaly with evidence for congestive heart failure. There are moderate to large bilateral pleural effusions, right greater than left. There is no pneumothorax. Bibasilar airspace disease is noted and favored to represent atelectasis. IMPRESSION: 1. Cardiomegaly with evidence for congestive heart failure. 2. Moderate to large bilateral pleural effusions, right greater than left. Electronically Signed   By: Katherine Mantle M.D.   On:  04/09/2020 00:27   ECHOCARDIOGRAM COMPLETE  Result Date: 05/04/2020    ECHOCARDIOGRAM REPORT   Patient Name:   [name] Date of Exam: 05/04/2020 Medical Rec #:  [id number]       Height:       65.0 in Accession #:    [id number]      Weight:       159.2 lb Date of Birth:  1946/04/26        BSA:          1.795 m Patient Age:    74 years        BP:           110/88 mmHg Patient Gender: F               HR:           112 bpm. Exam Location:  [facility] Procedure: 2D Echo, Color Doppler and Cardiac  Doppler Indications:     Shock  History:         Patient has prior history of Echocardiogram examinations, most                  recent 12/06/2019. CHF, Stroke, Arrythmias:Atrial Fibrillation;                  Risk Factors:Hypertension.  Sonographer:     Humphrey Rolls RDCS (AE) Referring Phys:  [id number] Judithe Modest Diagnosing Phys: Alwyn Pea MD  Sonographer Comments: Suboptimal apical window and no subcostal window. IMPRESSIONS  1. Left ventricular ejection fraction, by estimation, is 35 to 40%. The left ventricle has moderately decreased function. The left ventricle demonstrates regional wall motion abnormalities (see scoring diagram/findings for description). The left ventricular internal cavity size was mildly dilated. There is mild left ventricular hypertrophy. Left ventricular diastolic parameters are consistent with Grade I diastolic dysfunction (impaired relaxation).  2. Right ventricular systolic function is moderately reduced. The right ventricular size is moderately enlarged. There is severely elevated pulmonary artery systolic pressure.  3. Left atrial size was severely dilated.  4. Right atrial size was moderately dilated.  5. Large pericardial effusion.  6. The mitral valve is grossly normal. Mild to moderate mitral valve regurgitation. No evidence of mitral stenosis.  7. Tricuspid valve regurgitation is severe.  8. The aortic valve is grossly normal. Aortic valve regurgitation is trivial. Mild to moderate  aortic valve sclerosis/calcification is present, without any evidence of aortic stenosis. FINDINGS  Left Ventricle: Left ventricular ejection fraction, by estimation, is 35 to 40%. The left ventricle has moderately decreased function. The left ventricle demonstrates regional wall motion abnormalities. The left ventricular internal cavity size was mildly dilated. There is mild left ventricular hypertrophy. Abnormal (paradoxical) septal motion, consistent with left bundle branch block. Left ventricular diastolic parameters are consistent with Grade I diastolic dysfunction (impaired relaxation). Right Ventricle: The right ventricular size is moderately enlarged. No increase in right ventricular wall thickness. Right ventricular systolic function is moderately reduced. There is severely elevated pulmonary artery systolic pressure. Left Atrium: Left atrial size was severely dilated. Right Atrium: Right atrial size was moderately dilated. Pericardium: A large pericardial effusion is present. Mitral Valve: The mitral valve is grossly normal. There is mild thickening of the mitral valve leaflet(s). There is mild calcification of the mitral valve leaflet(s). Normal mobility of the mitral valve leaflets. Mild mitral annular calcification. Mild to moderate mitral valve regurgitation. No evidence of mitral valve stenosis. MV peak gradient, 12.4 mmHg. The mean mitral valve gradient is 6.0 mmHg. Tricuspid Valve: The tricuspid valve is grossly normal. Tricuspid valve regurgitation is severe. Aortic Valve: The aortic valve is grossly normal. Aortic valve regurgitation is trivial. Mild to moderate aortic valve sclerosis/calcification is present, without any evidence of aortic stenosis. Aortic valve mean gradient measures 12.0 mmHg. Aortic valve peak gradient measures 20.1 mmHg. Aortic valve area, by VTI measures 0.86 cm. Pulmonic Valve: The pulmonic valve was grossly normal. Pulmonic valve regurgitation is mild. Aorta: The aortic  arch was not well visualized. IAS/Shunts: No atrial level shunt detected by color flow Doppler.  LEFT VENTRICLE PLAX 2D LVIDd:         5.13 cm  Diastology LVIDs:         3.38 cm  LV e' medial:    11.60 cm/s LV PW:         1.03 cm  LV E/e' medial:  13.7 LV IVS:  0.77 cm  LV e' lateral:   8.16 cm/s LVOT diam:     2.30 cm  LV E/e' lateral: 19.5 LV SV:         34 LV SV Index:   19 LVOT Area:     4.15 cm  RIGHT VENTRICLE RV Basal diam:  3.85 cm LEFT ATRIUM              Index       RIGHT ATRIUM           Index LA diam:        6.50 cm  3.62 cm/m  RA Area:     30.80 cm LA Vol (A2C):   74.2 ml  41.33 ml/m RA Volume:   103.00 ml 57.38 ml/m LA Vol (A4C):   119.0 ml 66.29 ml/m LA Biplane Vol: 104.0 ml 57.93 ml/m  AORTIC VALVE                    PULMONIC VALVE AV Area (Vmax):    1.20 cm     PV Vmax:       1.13 m/s AV Area (Vmean):   1.00 cm     PV Vmean:      70.500 cm/s AV Area (VTI):     0.86 cm     PV VTI:        0.149 m AV Vmax:           224.00 cm/s  PV Peak grad:  5.1 mmHg AV Vmean:          160.000 cm/s PV Mean grad:  2.0 mmHg AV VTI:            0.401 m AV Peak Grad:      20.1 mmHg AV Mean Grad:      12.0 mmHg LVOT Vmax:         64.90 cm/s LVOT Vmean:        38.600 cm/s LVOT VTI:          0.083 m LVOT/AV VTI ratio: 0.21  AORTA Ao Root diam: 2.40 cm MITRAL VALVE                TRICUSPID VALVE MV Area (PHT): 4.97 cm     TR Peak grad:   37.7 mmHg MV Peak grad:  12.4 mmHg    TR Vmax:        307.00 cm/s MV Mean grad:  6.0 mmHg MV Vmax:       1.76 m/s     SHUNTS MV Vmean:      113.0 cm/s   Systemic VTI:  0.08 m MV Decel Time: 153 msec     Systemic Diam: 2.30 cm MV E velocity: 159.33 cm/s Dwayne Salome Arnt MD Electronically signed by Alwyn Pea MD Signature Date/Time: 05/04/2020/3:12:58 PM    Final    US THORACENTESIS ASP PLEURAL SPACE W/IMG GUIDE  Result Date: 04/24/2020 INDICATION: Patient with history of congestive heart failure, chronic atrial fibrillation, prior CVA, recent subtotal colectomy and  ileostomy, bilateral pleural effusions, dyspnea ; status post right thoracentesis on 04/23/20 ; request now received for diagnostic and therapeutic left thoracentesis. EXAM: ULTRASOUND GUIDED DIAGNOSTIC AND THERAPEUTIC LEFT THORACENTESIS MEDICATIONS: 1% lidocaine to skin and subcutaneous tissue COMPLICATIONS: None immediate. PROCEDURE: An ultrasound guided thoracentesis was thoroughly discussed with the patient and questions answered. The benefits, risks, alternatives and complications were also discussed. The patient understands and wishes to proceed with the procedure. Written consent was obtained.  Ultrasound was performed to localize and mark an adequate pocket of fluid in the left chest. The area was then prepped and draped in the normal sterile fashion. 1% Lidocaine was used for local anesthesia. Under ultrasound guidance a 6 Fr Safe-T-Centesis catheter was introduced. Thoracentesis was performed. The catheter was removed and a dressing applied. FINDINGS: A total of approximately 750 cc of yellow fluid was removed. Samples were sent to the laboratory as requested by the clinical team. IMPRESSION: Successful ultrasound guided diagnostic and therapeutic left thoracentesis yielding 750 cc of pleural fluid. Read by: Jeananne Rama, PA-C Electronically Signed   By: Elige Ko   On: 04/24/2020 12:07   US THORACENTESIS ASP PLEURAL SPACE W/IMG GUIDE  Result Date: 04/23/2020 INDICATION: Patient with history chronic respiratory failure, AFib, CHF with shortness of breath. Found to have pleural effusion. Request is for therapeutic and diagnostic thoracentesis. EXAM: ULTRASOUND GUIDED THERAPEUTIC AND DIAGNOSTIC THORACENTESIS MEDICATIONS: Lidocaine 1% 10 mL COMPLICATIONS: None immediate. PROCEDURE: An ultrasound guided thoracentesis was thoroughly discussed with the patient and questions answered. The benefits, risks, alternatives and complications were also discussed. The patient understands and wishes to proceed with  the procedure. Written consent was obtained. Ultrasound was performed to localize and mark an adequate pocket of fluid in the right chest. The area was then prepped and draped in the normal sterile fashion. 1% Lidocaine was used for local anesthesia. Under ultrasound guidance a 6 Fr Safe-T-Centesis catheter was introduced. Thoracentesis was performed. The catheter was removed and a dressing applied. FINDINGS: A total of approximately 1 L of straw-colored fluid was removed. Samples were sent to the laboratory as requested by the clinical team. IMPRESSION: Successful ultrasound guided therapeutic and diagnostic right sided thoracentesis yielding 1 L of pleural fluid. Read by: Anders Grant, NP Electronically Signed   By: Simonne Come M.D.   On: 04/23/2020 12:12    Consults: Palliative Care  Subjective:    Overnight Issues: Persistent elevation of lactic acid.  Patient required peripheral Levophed and bicarb infusion.  Patient with respiratory distress and large pleural effusions.  Proceeded to place RIGHT chest tube to drain effusion.  Mains encephalopathic  Objective:  Vital signs for last 24 hours: Temp:  [91.9 F (33.3 C)-98.8 F (37.1 C)] 98.6 F (37 C) (12/03 1600) Pulse Rate:  [59-127] 117 (12/03 1945) Resp:  [15-51] 19 (12/03 1945) BP: (64-132)/(51-98) 121/82 (12/03 1930) SpO2:  [97 %-100 %] 100 % (12/03 1945) Weight:  [72.2 kg] 72.2 kg (12/03 0341)  Hemodynamic parameters for last 24 hours:    Intake/Output from previous day: 12/02 0701 - 12/03 0700 In: 3110.4 [I.V.:2089.6; Blood:311; IV Piggyback:709.8] Out: 250 [Urine:250]  Intake/Output this shift: No intake/output data recorded.  Vent settings for last 24 hours:    Physical Exam:  GENERAL: Chronically ill-appearing woman, tachypneic, looks uncomfortable.  Encephalopathic. HEAD: Normocephalic, atraumatic.  EYES: Pupils equal, round, reactive to light.  No scleral icterus.  MOUTH: Edentulous, oral mucosa moist.  No  thrush. NECK: Supple. No thyromegaly. Trachea midline. No JVD.  No adenopathy. PULMONARY: Minutes breath sounds at the bases with dullness to percussion.  Rhonchi in the upper lung zones. CARDIOVASCULAR: S1 and S2.  Tachycardic rate with regular rhythm.  Grade 3/6 mitral regurg murmur. ABDOMEN: Ileostomy in place.  Mildly distended, soft, normoactive bowel sounds. MUSCULOSKELETAL: No joint deformity, no clubbing, no edema.  NEUROLOGIC: Encephalopathic, easily redirected.  No overt focal deficit.   SKIN: Notable petechiae or ecchymoses particularly in dependent areas. PSYCH: Encephalopathic, moaning.  Can be  redirected.  Assessment/Plan:   Cardiogenic shock with lactic acidosis Evidence of biventricular failure SEVERE pulmonary hypertension SEVERE tricuspid regurgitation LVEF 35% down from 55% Right ventricular systolic function declined from prior Weaned off of pressor after right chest drainage Bilateral pleural effusions due to right-sided failure Diuresis if tolerates Patient now DNR  Bilateral pleural effusions RIGHT greater than left Concern for right pleural effusion loculation Chest tube placed Effusion free-flowing 1200 mL Parameters appear transudative consistent with above diagnoses Respiratory distress improved after chest tube placement  ID Procalcitonin 2.0 Cannot exclude underlying pneumonia Continue antibiotics  Hypoglycemia Resolved  Acute kidney injury Suspect cardiorenal syndrome  Persistent lactic acidosis Due to cardiogenic shock Poor prognostic sign  Coagulopathy Eliquis discontinued Received FFP Vitamin K x1  Acute encephalopathy Metabolic Has underlying cognitive decline Prior history of encephalitis   LOS: 1 day   Additional comments: Multidisciplinary rounds were performed with the ICU team.  Discussed at length with the patient's son, discussed with Palliative Care.  Patient will be made DNR status.  Family contemplating transition  to comfort care.  Critical Care Total Time*: 45 Minutes  C. Danice Goltz, MD [facility] 05/04/2020  *Care during the described time interval was provided by me and/or other providers on the critical care team.  I have reviewed this patient's available data, including medical history, events of note, physical examination and test results as part of my evaluation.  **This note was dictated using voice recognition software/Dragon.  Despite best efforts to proofread, errors can occur which can change the meaning.  Any change was purely unintentional.

## 2020-05-04 NOTE — Procedures (Addendum)
Chest Tube Insertion: Indication: Recurrent bilateral pleural effusions RIGHT greater than left, patient with respiratory distress due to same Consent: Per patient's son, patient unable to consent for self  Risks and benefits explained in detail including risk of infection, bleeding, respiratory failure and death.  Risks increased for the patient in particular due to coagulopathy.  Risks understood by the patient's son..   Hand washing performed prior to starting the procedure.   Type of Anesthesia: 1 % Lidocaine.  Patient received premedication with Versed 1 mg IV and fentanyl 50 mcg IV x1 for comfort.  Procedure: An active timeout was performed and correct patient, name, & ID confirmed.  After explaining risks and benefits, patient positioned correctly for chest tube placement.  The right chest was examined with ultrasound and the appropriate entry point was marked for optimal placement of the tube.  Images could not be saved.  Patient was prepped in a sterile fashion including chlorohexadine preps, sterile drape, sterile gown and sterile gloves. Introducer needle was placed and guide wire was inserted. Using Seldinger Technique, the introducer needle was removed and a dilator dilators was used to create an opening for insertion of chest tube.  A #14 [place] pneumothorax pigtail catheter was introduced.  The catheter was connected to a drainage system after appropriate pleural fluid samples were obtained.  Total fluid 1200 mL.  Findings: Gush clear yellow liquid noted upon insetion of needle, Chest tube placed bewteen 6-7 th ICS posterior to the midaxillary line.  Chest tube connected to: pleurovac 20 cm.   Number of Attempts:1  Complications: None  Estimated Blood Loss: Minimal less than 2 mL  Portable chest x-ray: Shows no pneumothorax, no pleural fluid remaining.  Operator: Jayme Cloud  Patient tolerated the procedure well.  Gailen Shelter, MD [facility]

## 2020-05-04 NOTE — Progress Notes (Signed)
Goals of Care discussion  Alerted by care RN Martie Lee that family was all present and they expressed their wishes to initiate comfort measures only.  I discussed with the patient's son Sheneika Walstad bedside and discussed initiation of comfort measures and the medications used to keep his mother comfortable during this time.  He agreed with the plan of care and Comfort Measures initiated.    Cheryll Cockayne Rust-Chester, AGACNP-BC Acute Care Nurse Practitioner [facility]   [phone] / [phone] Please see Amion for pager details.

## 2020-05-04 NOTE — Consult Note (Signed)
Consultation Note Date: 05/04/2020   Patient Name: [name]  DOB: [birth year]  MRN: [id number]  Age / Sex: 74 y.o., female  PCP: Elba Barman, MD Referring Physician: Tyler Pita, MD  Reason for Consultation: Establishing goals of care  HPI/Patient Profile: 4 F from [place] with [place] as outlined below came in with altered mentation in circulatory shock with bilateral pneumonia and parapneumonic effusions admitted with septic shock due to CAP with severe lactic acidosis, hypoglycemia, requiring IVF and Levophed.  PCCM asked for MICU admit due to shock on vasopressor.  Clinical Assessment and Goals of Care: Patient is resting in bed with chest tube in place. Her HPOA who is one of her son's and her second son are at present. Met with them in [place]. They discuss all of the pain and suffering she has been through, as well as the cycle of coming to the hospital. They state they do not want her to have pain or suffering and want her to be comfortable for what time she has left. CCM entered to discuss her heart failure and the volume of fluid just pulled from CT insertion.   Family decided to shift to comfort focused care once the family has been able to spend time with her.   I completed a MOST form today with son who is HPOA, and the signed original was placed in the chart. A photocopy was placed in the chart to be scanned into EMR. The patient outlined their wishes for the following treatment decisions:  Cardiopulmonary Resuscitation: Do Not Attempt Resuscitation (DNR/No CPR)  Medical Interventions: Comfort Measures: Keep clean, warm, and dry. Use medication by any route, positioning, wound care, and other measures to relieve pain and suffering. Use oxygen, suction and manual treatment of airway obstruction as needed for comfort. Do not transfer to the hospital unless comfort needs cannot be  met in current location.  Antibiotics: No antibiotics (use other measures to relieve symptoms)  IV Fluids: No IV fluids (provide other measures to ensure comfort)  Feeding Tube: No feeding tube          SUMMARY OF RECOMMENDATIONS   Shift to full comfort care once family has been able to spend time with her.  Recommend hospice facility if she is stable for transport at the time they can accept transfer.    Prognosis:   < 2 weeks      Primary Diagnoses: Present on Admission: . Pneumonia due to organism   I have reviewed the medical record, interviewed the patient and family, and examined the patient. The following aspects are pertinent.  Past Medical History:  Diagnosis Date  . Acute respiratory failure ([place])    Secondary to aspiration pneumonia   . Altered mental status    Secondary to viral herpes simplex virus encephalitis  . Anemia   . Atrial fibrillation ([place])   . Bilateral pneumonia   . CHF (congestive heart failure) ([place])   . Dysphasia   . Encephalitis   . Hyperglycemia   .  Hypertension   . Hyponatremia   . IBS (irritable bowel syndrome)   . Seizures ([place])   . Stroke [facility])    Social History   Socioeconomic History  . Marital status: Widowed    Spouse name: Not on file  . Number of children: 2  . Years of education: Not on file  . Highest education level: Not on file  Occupational History  . Occupation: medical transcript  Tobacco Use  . Smoking status: Never Smoker  . Smokeless tobacco: Never Used  Vaping Use  . Vaping Use: Never used  Substance and Sexual Activity  . Alcohol use: No  . Drug use: No  . Sexual activity: Never  Other Topics Concern  . Not on file  Social History Narrative   Pt resides at [facility] at this time. Son is [employer].   Social Determinants of Health   Financial Resource Strain:   . Difficulty of Paying Living Expenses: Not on file  Food Insecurity:   . Worried About Charity fundraiser in  the Last Year: Not on file  . Ran Out of Food in the Last Year: Not on file  Transportation Needs:   . Lack of Transportation (Medical): Not on file  . Lack of Transportation (Non-Medical): Not on file  Physical Activity:   . Days of Exercise per Week: Not on file  . Minutes of Exercise per Session: Not on file  Stress:   . Feeling of Stress : Not on file  Social Connections:   . Frequency of Communication with Friends and Family: Not on file  . Frequency of Social Gatherings with Friends and Family: Not on file  . Attends Religious Services: Not on file  . Active Member of Clubs or Organizations: Not on file  . Attends Archivist Meetings: Not on file  . Marital Status: Not on file   Family History  Problem Relation Age of Onset  . Prostate cancer Father   . Breast cancer Maternal Aunt   . Breast cancer Maternal Aunt   . Cerebral palsy Son    Scheduled Meds: . Chlorhexidine Gluconate Cloth  6 each Topical Daily  . furosemide  20 mg Intravenous Once  . pantoprazole (PROTONIX) IV  40 mg Intravenous QHS   Continuous Infusions: . sodium chloride Stopped (05/02/2020 1951)  . sodium chloride Stopped (05/10/2020 1951)  . sodium chloride Stopped (05/14/2020 1949)  . lactated ringers Stopped (05/30/2020 1952)  . linezolid (ZYVOX) IV 600 mg (05/04/20 1338)  . norepinephrine (LEVOPHED) Adult infusion Stopped (05/04/20 1600)  . phytonadione (VITAMIN K) IV    . piperacillin-tazobactam (ZOSYN)  IV Stopped (05/04/20 0906)  . sodium bicarbonate (isotonic) 150 mEq in D5W 1000 mL infusion 125 mL/hr at 05/04/20 1200   PRN Meds:.docusate sodium, fentaNYL (SUBLIMAZE) injection, midazolam, morphine injection, polyethylene glycol Medications Prior to Admission:  Prior to Admission medications   Medication Sig Start Date End Date Taking? Authorizing Provider  acetaminophen (TYLENOL) 325 MG tablet Take 650 mg by mouth every 6 (six) hours as needed for mild pain, moderate pain or fever.    Yes  [provider]  amiodarone (PACERONE) 200 MG tablet Take 1 tablet (200 mg total) by mouth daily. Patient taking differently: Take 200 mg by mouth 2 (two) times daily.  03/20/20  Yes Enzo Bi, MD  apixaban (ELIQUIS) 5 MG TABS tablet Take 1 tablet (5 mg total) by mouth 2 (two) times daily. 03/20/19  Yes Vaughan Basta, MD  Ensure Max Protein (ENSURE MAX PROTEIN) LIQD Take 330 mLs (11 oz total) by mouth 2 (two) times daily. 03/20/20  Yes Enzo Bi, MD  folic acid (FOLVITE) 1 MG tablet Take 1 mg by mouth daily.    Yes [provider]  furosemide (LASIX) 20 MG tablet Take 1 tablet (20 mg total) by mouth daily. 04/24/20  Yes Wieting, Richard, MD  hydrocerin (EUCERIN) CREA Apply 1 application topically 2 (two) times daily. 04/03/20  Yes Wieting, Richard, MD  ipratropium-albuterol (DUONEB) 0.5-2.5 (3) MG/3ML SOLN Take 3 mLs by nebulization every 4 (four) hours as needed (shortness of breath).   Yes [provider]  ipratropium-albuterol (DUONEB) 0.5-2.5 (3) MG/3ML SOLN Take 3 mLs by nebulization 3 (three) times daily.   Yes [provider]  levothyroxine (SYNTHROID) 75 MCG tablet Take 75 mcg by mouth daily before breakfast.    Yes [provider]  metoprolol succinate (TOPROL-XL) 50 MG 24 hr tablet Take 1 tablet (50 mg total) by mouth 2 (two) times daily. Take with or immediately following a meal. 04/03/20  Yes Wieting, Richard, MD  midodrine (PROAMATINE) 2.5 MG tablet Take 1 tablet (2.5 mg total) by mouth 3 (three) times daily with meals. 04/03/20  Yes Wieting, Richard, MD  Multiple Vitamin (MULTIVITAMIN WITH MINERALS) TABS tablet Take 1 tablet by mouth daily. 03/20/20  Yes Enzo Bi, MD  Omeprazole 20 MG TBEC Take 20 mg by mouth daily.    Yes [provider]  sodium chloride (OCEAN) 0.65 % SOLN nasal spray Place 2 sprays into both nostrils every 2 (two) hours as needed for congestion. 04/24/20  Yes Wieting, Richard, MD  sodium chloride 1 g tablet  Take 1 tablet (1 g total) by mouth 2 (two) times daily with a meal. 04/24/20  Yes Loletha Grayer, MD   Allergies  Allergen Reactions  . Prednisone Hives  . Predicort [Prednisolone Acetate]     HIVES,   Review of Systems  Unable to perform ROS   Physical Exam Constitutional:      Comments: Eyes closed.   Pulmonary:     Effort: Pulmonary effort is normal.     Vital Signs: BP (!) 132/58 (BP Location: Right Leg)   Pulse (!) 123   Temp 98.6 F (37 C) (Rectal)   Resp (!) 23   Ht '5\' 5"'  (1.651 m)   Wt 72.2 kg   SpO2 100%   BMI 26.49 kg/m  Pain Scale: CPOT   Pain Score: 5    SpO2: SpO2: 100 % O2 Device:SpO2: 100 % O2 Flow Rate: .O2 Flow Rate (L/min): 4 L/min  IO: Intake/output summary:   Intake/Output Summary (Last 24 hours) at 05/04/2020 1630 Last data filed at 05/04/2020 1200 Gross per 24 hour  Intake 4139.51 ml  Output 250 ml  Net 3889.51 ml    LBM:   Baseline Weight: Weight: 68.5 kg Most recent weight: Weight: 72.2 kg     Palliative Assessment/Data:     Time In: 5:40 Time Out: 4:30 Time Total: 50 min Greater than 50%  of this time was spent counseling and coordinating care related to the above assessment and plan.  Signed by: Asencion Gowda, NP   Please contact Palliative Medicine Team phone at [phone] for questions and concerns.  For individual provider: See Shea Evans

## 2020-05-04 NOTE — Progress Notes (Signed)
Pt has become more alert throughout the shift after receiving 100 mEq of sodium Bicarb, she is able to follow commands and localize pain, her speech is slurred/dysarthria. Blood glucose has dropped twice at 65 and 70, requiring 1.5 amps total of D50. A Sodium Bicarb 150 mEq in dextrose 5% of continuous infusion is running at 150 ml, Levophed is running at 6 mcg to maintain a BP MAP >65, 3rd unit of FFP is currently running with 1 unit remaining.  INR from 0262 was 3.6  Prothrombin time from 0262 was 34.6 Lactic Acid from 0100 was >11.0, a new lactic acid was redrawn this morning at 0600, awaiting results. Will continue to monitor pt.

## 2020-05-04 NOTE — Progress Notes (Signed)
BRIEF CRITICAL CARE NOTE  Pt's repeat lactic acid has trended up to >11.0.  Pt is currently on 6 mcg Levophed peripherally and maintaining MAP >65.  Pt is more awake now since receiving bicarb pushes and being placed on Bicarb gtt.  She currenlty complains of back pain.  Pt was previously underwent CT Abdomen which showed only mild ascites. Pt denies abdominal pain, and abdominal exam without distention, guarding or rebound tenderness.  UA was negative for UTI.  Source of Sepsis is Pneumonia and questionable Empyema/right loculated effusion.  Called and discussed with Dr. Dellie Catholic of [facility] regarding concern for worsening lactic acidosis.  Dr. Dellie Catholic recommends giving additional  1L of IVF, and rechecking labs.  Recommends placing central line for evaluation of CVP and Co-ox panel.  Order also placed for Echocardiogram.  Pt previously noted to have INR 5.4 and subsequently received 2 units FFP. Will check repeat INR and DIC panel to ensure safety for placing central line.     Of note, I personally updated pt's son [name] at the beginning of the shift at bedside.  We discussed the critical nature of his mother, with guarded prognosis.  He was very adamant that she be Full Code and wishes to pursue all aggressive medical interventions at this time.   Harlon Ditty, AGACNP-BC [facility] Pager: [phone]

## 2020-05-04 NOTE — Progress Notes (Signed)
*  PRELIMINARY RESULTS* Echocardiogram 2D Echocardiogram has been performed.  [name] [name] 05/04/2020, 9:42 AM

## 2020-05-05 DIAGNOSIS — R570 Cardiogenic shock: Secondary | ICD-10-CM | POA: Diagnosis not present

## 2020-05-05 LAB — PREPARE FRESH FROZEN PLASMA

## 2020-05-05 LAB — BPAM FFP
Blood Product Expiration Date: 202112082359
Blood Product Expiration Date: 202112082359
ISSUE DATE / TIME: 202112030506
ISSUE DATE / TIME: 202112030634
Unit Type and Rh: 2800
Unit Type and Rh: 8400

## 2020-05-05 LAB — URINE CULTURE: Culture: 60000 — AB

## 2020-05-05 NOTE — Significant Event (Addendum)
Death Pronouncement Note  I was notified by RN at 09-21-2050 of patient's expiration at 1906 confirmed by 2 RNs.  The patient was seen bedside.  She has no spontaneous movements.  There was no response to verbal or tactile stimuli.  Pupils were mid-dilated and fixed.  No breath sounds were appreciated over either lung field. No carotid pulses were palpable.  No heart sounds were auscultated over the precordium.  Patient pronounced dead at 09/20/08 on 05-15-20.  Next-of-kin was notified (Son [name]) by me, though was already made aware earlier.  The family declines autopsy.  The patient was DNR/DNI, comfort care only.  The son says funeral home to contact is [facility] on [address], [place].  He expressed appreciation and gratefulness for the care and communication by Dr. Jayme Cloud.   ___________________ Drue Stager. Coralie Common, MD [facility]

## 2020-05-05 NOTE — Progress Notes (Signed)
Follow up - Critical Care Medicine Note  Patient Details:    [name] is an 74 y.o. 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  She has been in general decline for a year and a half with adult failure to thrive.  Presented with circulatory shock, lactic acidosis and presumed bilateral pneumonia with parapneumonic effusions.  She is noted to have severe lactic acidosis, hypoglycemia and hypotension requiring pressors.  Lines, Airways, Drains: Chest Tube 1 Lateral;Right Pleural 14 Fr. (Active)  Status -20 cm H2O 05/04/20 1600  Chest Tube Air Leak Large 05/04/20 1600  Patency Intervention Tip/tilt 05/04/20 1600  Drainage Description Serous;Serosanguineous 05/04/20 1600  Dressing Status Clean;Dry;Intact 05/04/20 1600  Dressing Intervention New dressing 05/04/20 1600  Site Assessment Clean;Intact;Dry 05/04/20 1600  Surrounding Skin Dry;Intact 05/04/20 1600  Output (mL) 1230 mL 05/04/20 1600     Ileostomy Continent (Abdominal pouch) RUQ (Active)  Ostomy Pouch 2 piece;Intact 05/04/20 1600  Stoma Assessment Pink 05/04/20 1600  Peristomal Assessment Intact 05/04/20 1600  Treatment Other (Comment) 04/24/20 1330  Output (mL) 0 mL 05/04/20 1600     Urethral Catheter Vernona Rieger RN (Active)  Indication for Insertion or Continuance of Catheter Unstable critically ill patients first 24-48 hours (See Criteria) 05/04/20 1600  Site Assessment Clean;Intact 05/04/20 1600  Catheter Maintenance Bag below level of bladder;Catheter secured;Drainage bag/tubing not touching floor;Insertion date on drainage bag;No dependent loops;Seal intact 05/04/20 1600  Collection Container Standard drainage bag 05/04/20 1600  Securement Method Securing device (Describe) 05/04/20 1600  Urinary Catheter Interventions (if applicable) Unclamped 05/04/20 1600  Output (mL) 50 mL 05/04/20 1600    Anti-infectives:  Anti-infectives (From admission, onward)    Start     Dose/Rate Route Frequency Ordered Stop   05/04/20 1200  linezolid (ZYVOX) IVPB 600 mg  Status:  Discontinued        600 mg 300 mL/hr over 60 Minutes Intravenous Every 12 hours 05/04/20 1053 05/04/20 1956   05/04/20 0800  vancomycin (VANCOREADY) IVPB 500 mg/100 mL  Status:  Discontinued        500 mg 100 mL/hr over 60 Minutes Intravenous Every 12 hours 05/22/20 1734 05/04/20 0831   05-22-20 2000  piperacillin-tazobactam (ZOSYN) IVPB 3.375 g  Status:  Discontinued        3.375 g 12.5 mL/hr over 240 Minutes Intravenous Every 8 hours 05/22/2020 1745 05/04/20 1956   22-May-2020 1515  vancomycin (VANCOREADY) IVPB 500 mg/100 mL  Status:  Discontinued       "Followed by" Linked Group Details   500 mg 100 mL/hr over 60 Minutes Intravenous  Once 05/22/2020 1413 05/04/20 0829   May 22, 2020 1415  ceFEPIme (MAXIPIME) 2 g in sodium chloride 0.9 % 100 mL IVPB        2 g 200 mL/hr over 30 Minutes Intravenous  Once May 22, 2020 1405 May 22, 2020 1451   05-22-2020 1415  vancomycin (VANCOCIN) IVPB 1000 mg/200 mL premix  Status:  Discontinued        1,000 mg 200 mL/hr over 60 Minutes Intravenous  Once 2020/05/22 1405 05-22-2020 1413   2020-05-22 1415  vancomycin (VANCOCIN) IVPB 1000 mg/200 mL premix       "Followed by" Linked Group Details   1,000 mg 200 mL/hr over 60 Minutes Intravenous  Once 2020/05/22 1413 05-22-20 1536     Scheduled Meds:  Continuous Infusions: . sodium chloride Stopped (05/22/2020 1951)  . sodium chloride Stopped (05/22/20 1951)  . sodium chloride Stopped (05-22-20 1949)  .  dextrose Stopped (05/04/20 2001)  . lactated ringers Stopped (27-May-2020 1952)  . morphine 5 mg/hr (05/04/2020 1000)  . phytonadione (VITAMIN K) IV     PRN Meds:.acetaminophen **OR** acetaminophen, diphenhydrAMINE, docusate sodium, glycopyrrolate **OR** glycopyrrolate **OR** glycopyrrolate, midazolam, morphine, polyethylene glycol, polyvinyl alcohol  Results for orders placed or performed during the hospital encounter of 2020/05/27  (from the past 24 hour(s))  Glucose, capillary     Status: Abnormal   Collection Time: 05/04/20  1:57 PM  Result Value Ref Range   Glucose-Capillary 145 (H) 70 - 99 mg/dL  Glucose, capillary     Status: Abnormal   Collection Time: 05/04/20  4:00 PM  Result Value Ref Range   Glucose-Capillary 133 (H) 70 - 99 mg/dL  Albumin, pleural or peritoneal fluid     Status: None   Collection Time: 05/04/20  4:05 PM  Result Value Ref Range   Albumin, Fluid 1.3 g/dL   Fluid Type-FALB Pleural R   Glucose, pleural or peritoneal fluid     Status: None   Collection Time: 05/04/20  4:05 PM  Result Value Ref Range   Glucose, Fluid 121 mg/dL   Fluid Type-FGLU Pleural R   Lactate dehydrogenase (pleural or peritoneal fluid)     Status: Abnormal   Collection Time: 05/04/20  4:05 PM  Result Value Ref Range   LD, Fluid 90 (H) 3 - 23 U/L   Fluid Type-FLDH Pleural R   Protein, pleural or peritoneal fluid     Status: None   Collection Time: 05/04/20  4:05 PM  Result Value Ref Range   Total protein, fluid <3.0 g/dL   Fluid Type-FTP Pleural R   Body fluid culture     Status: None (Preliminary result)   Collection Time: 05/04/20  4:05 PM   Specimen: Pleura; Body Fluid  Result Value Ref Range   Specimen Description      PLEURAL Performed at [facility], [address] [place], [place] [address]    Special Requests      NONE Performed at [facility], [address] [place], [place] [address]    Gram Stain      RARE WBC PRESENT,BOTH PMN AND MONONUCLEAR NO ORGANISMS SEEN Performed at [facility], [address] [place], [place] [address]    Culture PENDING    Report Status PENDING   MRSA PCR Screening     Status: None   Collection Time: 05/04/20  4:22 PM   Specimen: Nasopharyngeal  Result Value Ref Range   MRSA by PCR NEGATIVE NEGATIVE    Microbiology: Results for orders placed or performed during the hospital encounter of 27-May-2020  Group A Strep by PCR ([facility] Only)      Status: None   Collection Time: 05-27-2020  3:28 AM   Specimen: Nasopharyngeal Swab; Sterile Swab  Result Value Ref Range Status   Group A Strep by PCR NOT DETECTED NOT DETECTED Final    Comment: Performed at [facility], [address] [place], [place] [address]  Resp Panel by RT-PCR (Flu A&B, Covid) Nasopharyngeal Swab     Status: None   Collection Time: May 27, 2020  3:28 AM   Specimen: Nasopharyngeal Swab; Nasopharyngeal(NP) swabs in vial transport medium  Result Value Ref Range Status   SARS Coronavirus 2 by RT PCR NEGATIVE NEGATIVE Final    Comment: (NOTE) SARS-CoV-2 target nucleic acids are NOT DETECTED.  The SARS-CoV-2 RNA is generally detectable in upper respiratory specimens during the acute phase of  infection. The lowest concentration of SARS-CoV-2 viral copies this assay can detect is 138 copies/mL. A negative result does not preclude SARS-Cov-2 infection and should not be used as the sole basis for treatment or other patient management decisions. A negative result may occur with  improper specimen collection/handling, submission of specimen other than nasopharyngeal swab, presence of viral mutation(s) within the areas targeted by this assay, and inadequate number of viral copies(<138 copies/mL). A negative result must be combined with clinical observations, patient history, and epidemiological information. The expected result is Negative.  Fact Sheet for Patients:  BloggerCourse.comhttps://www.fda.gov/media/152166/download  Fact Sheet for Healthcare Providers:  SeriousBroker.ithttps://www.fda.gov/media/152162/download  This test is no t yet approved or cleared by the [place] FDA and  has been authorized for detection and/or diagnosis of SARS-CoV-2 by FDA under an Emergency Use Authorization (EUA). This EUA will remain  in effect (meaning this test can be used) for the duration of the COVID-19 declaration under Section 564(b)(1) of the Act, 21 U.S.C.section 360bbb-3(b)(1), unless the  authorization is terminated  or revoked sooner.       Influenza A by PCR NEGATIVE NEGATIVE Final   Influenza B by PCR NEGATIVE NEGATIVE Final    Comment: (NOTE) The Xpert Xpress SARS-CoV-2/FLU/RSV plus assay is intended as an aid in the diagnosis of influenza from Nasopharyngeal swab specimens and should not be used as a sole basis for treatment. Nasal washings and aspirates are unacceptable for Xpert Xpress SARS-CoV-2/FLU/RSV testing.  Fact Sheet for Patients: BloggerCourse.comhttps://www.fda.gov/media/152166/download  Fact Sheet for Healthcare Providers: SeriousBroker.ithttps://www.fda.gov/media/152162/download  This test is not yet approved or cleared by the [place] FDA and has been authorized for detection and/or diagnosis of SARS-CoV-2 by FDA under an Emergency Use Authorization (EUA). This EUA will remain in effect (meaning this test can be used) for the duration of the COVID-19 declaration under Section 564(b)(1) of the Act, 21 U.S.C. section 360bbb-3(b)(1), unless the authorization is terminated or revoked.  Performed at [facility], [address] [place], [place] [address]   Urine culture     Status: None (Preliminary result)   Collection Time: September 17, 2019  2:52 PM   Specimen: Urine, Random  Result Value Ref Range Status   Specimen Description   Final    URINE, RANDOM Performed at [facility], [address] [place], [place] [address]    Special Requests   Final    NONE Performed at [facility], [address] [place], [place] [address]    Culture   Final    CULTURE REINCUBATED FOR BETTER GROWTH Performed at [facility], [address] [place], [place] [address]    Report Status PENDING  Incomplete  Blood Culture (routine x 2)     Status: None (Preliminary result)   Collection Time: September 17, 2019  2:53 PM   Specimen: BLOOD  Result Value Ref Range Status   Specimen Description BLOOD BLOOD LEFT HAND  Final   Special Requests   Final    BOTTLES  DRAWN AEROBIC AND ANAEROBIC Blood Culture adequate volume   Culture   Final    NO GROWTH 2 DAYS Performed at [facility], [address] [place], [place] [address]    Report Status PENDING  Incomplete  Blood Culture (routine x 2)     Status: None (Preliminary result)   Collection Time: September 17, 2019  3:05 PM   Specimen: BLOOD  Result Value Ref Range Status   Specimen Description BLOOD BLOOD RIGHT HAND  Final   Special Requests  Final    BOTTLES DRAWN AEROBIC AND ANAEROBIC Blood Culture adequate volume   Culture   Final    NO GROWTH 2 DAYS Performed at [facility], [address] [place], [place] [address]    Report Status PENDING  Incomplete  Body fluid culture     Status: None (Preliminary result)   Collection Time: 05/04/20  4:05 PM   Specimen: Pleura; Body Fluid  Result Value Ref Range Status   Specimen Description   Final    PLEURAL Performed at [facility], [address] [place], [place] [address]    Special Requests   Final    NONE Performed at [facility], [address] [place], [place] [address]    Gram Stain   Final    RARE WBC PRESENT,BOTH PMN AND MONONUCLEAR NO ORGANISMS SEEN Performed at [facility], [address] [place], [place] [address]    Culture PENDING  Incomplete   Report Status PENDING  Incomplete  MRSA PCR Screening     Status: None   Collection Time: 05/04/20  4:22 PM   Specimen: Nasopharyngeal  Result Value Ref Range Status   MRSA by PCR NEGATIVE NEGATIVE Final    Comment:        The GeneXpert MRSA Assay (FDA approved for NASAL specimens only), is one component of a comprehensive MRSA colonization surveillance program. It is not intended to diagnose MRSA infection nor to guide or monitor treatment for MRSA infections. Performed at [facility], [address] [place], [place] [address]     Best Practice/Protocols:  Patient had been on Eliquis currently coagulopathic, no DVT  prophylaxis, multiple petechiae, mechanical prophylaxis contraindicated   Events: 05/30/2020: Admitted to shock with bilateral effusions and severe lactic acidosis requiring pressors 05/04/2020: Chest tube place, RIGHT chest for management of large right effusion.  2D echo shows systolic dysfunction EF 35%, SEVERE pulmonary hypertension AND severe tricuspid insufficiency to it consistent with transudate. Etiology of shock cardiogenic.  Patient changed to DNR status. May 10, 2020: Patient transition to comfort care, she is comfortable on morphine infusion.  Studies: CT ABDOMEN PELVIS WO CONTRAST  Result Date: 05/04/2020 CLINICAL DATA:  Sepsis pneumonia, worsening lactic acidosis EXAM: CT ABDOMEN AND PELVIS WITHOUT CONTRAST TECHNIQUE: Multidetector CT imaging of the abdomen and pelvis was performed following the standard protocol without IV contrast. Sagittal and coronal MPR images reconstructed from axial data set. No oral contrast administered. Beam hardening artifacts from inclusion of patient's RIGHT arm and imaged field. COMPARISON:  05/14/2020 FINDINGS: Lower chest: BILATERAL pleural effusions with compressive atelectasis of lower lobes, RIGHT greater than LEFT little changed. Linear scarring RIGHT middle lobe. Hepatobiliary: Gallbladder surgically absent. Low-attenuation foci anteriorly in lateral segment LEFT lobe liver consistent with cysts, stable. No additional hepatic abnormalities. Pancreas: Mildly atrophic without focal mass Spleen: Normal appearance Adrenals/Urinary Tract: Thickening of adrenal glands without mass. Excreted contrast in renal collecting systems. Renal cortical atrophy without mass. No ureteral dilatation. Bladder decompressed by Foley catheter. Stomach/Bowel: Ileostomy RIGHT mid abdomen. Stomach and bowel loops otherwise normal appearance. Vascular/Lymphatic: Enlargement of cardiac chambers. Atherosclerotic calcifications aorta, iliac arteries, femoral arteries. Mitral annular  calcification. Aorta normal caliber. No adenopathy. Reproductive: Unremarkable uterus and ovaries Other: Scattered ascites.  No free air.  No hernia. Musculoskeletal: Osseous demineralization. Scattered subcutaneous edema. IMPRESSION: BILATERAL pleural effusions and compressive atelectasis of lower lobes, RIGHT greater than LEFT, little changed. Scattered ascites. No additional significant intra-abdominal or intrapelvic abnormalities. Aortic Atherosclerosis (ICD10-I70.0). Electronically Signed  By: Ulyses Southward M.D.   On: 05/04/2020 09:59   DG Chest 2 View  Result Date: 05/21/2020 CLINICAL DATA:  Shortness of breath EXAM: CHEST - 2 VIEW COMPARISON:  04/24/2020 FINDINGS: Small pleural effusions. Right basilar opacities. Mild cardiomegaly. No pneumothorax. IMPRESSION: Small bilateral pleural effusions with right basilar opacities, possibly pneumonia or atelectasis. Electronically Signed   By: Deatra Robinson M.D.   On: 05/19/2020 03:51   DG Chest 2 View  Result Date: 04/21/2020 CLINICAL DATA:  Shortness of breath, pleural effusion EXAM: CHEST - 2 VIEW COMPARISON:  04/18/2020 FINDINGS: No significant interval change in chest radiographs with moderate, layering bilateral pleural effusions and diffuse interstitial pulmonary opacity. Cardiomegaly. IMPRESSION: No significant interval change in chest radiographs with moderate, layering bilateral pleural effusions and diffuse interstitial pulmonary opacity, likely edema. Electronically Signed   By: Lauralyn Primes M.D.   On: 04/21/2020 15:31   DG Chest 2 View  Result Date: 04/11/2020 CLINICAL DATA:  74 year old female with a history congestive heart failure EXAM: CHEST - 2 VIEW COMPARISON:  04/09/2020, CT chest 03/28/2020 FINDINGS: Cardiomediastinal silhouette unchanged with the heart borders partially obscured by overlying lung and pleural disease. Opacity at the bilateral lung bases partially obscuring the hemidiaphragm and heart borders. No pneumothorax. Opacity  at the posterior lung base on the lateral view. Mild interlobular septal thickening. IMPRESSION: Bilateral pleural effusions with associated atelectasis/consolidation. Electronically Signed   By: Gilmer Mor D.O.   On: 04/11/2020 08:10   CT Head Wo Contrast  Result Date: May 05, 2020 CLINICAL DATA:  Altered mental status EXAM: CT HEAD WITHOUT CONTRAST TECHNIQUE: Contiguous axial images were obtained from the base of the skull through the vertex without intravenous contrast. COMPARISON:  January 13, 2018 head CT; brain MRI February 12, 2018 FINDINGS: Brain: There is a degree of stable age related volume loss. Right lateral ventricle is slightly larger than the left lateral ventricle, likely due to encephalomalacia based on prior infarcts on the right. There is no evident intracranial mass, hemorrhage, extra-axial fluid collection, or midline shift. There is evidence of a sizable right inferior to mid frontal and anterior in the mid right temporal lobe infarct, stable. Elsewhere, there is patchy small vessel disease in the centra semiovale bilaterally. No acute infarct is demonstrable. Vascular: No hyperdense vessel. There is mild calcification in the carotid siphon regions. Skull: Bony calvarium appears intact. Sinuses/Orbits: Visualized paranasal sinuses are clear. There is rightward deviation of the nasal septum. Orbits appear symmetric bilaterally. Other: Visualized mastoid air cells are clear. There is debris in the right external auditory canal. IMPRESSION: Prior right frontal and temporal lobe infarcts, stable. Age related volume loss with patchy periventricular small vessel disease is stable. No acute infarct. No mass or hemorrhage. There is mild arterial vascular calcification. There is rightward deviation of the nasal septum. There is probable cerumen in the right external auditory canal. Electronically Signed   By: Bretta Bang III M.D.   On: 05/16/2020 13:45   CT Angio Chest PE W and/or Wo  Contrast  Result Date: 05/26/2020 CLINICAL DATA:  Shortness of breath, acute generalized abdominal pain. EXAM: CT ANGIOGRAPHY CHEST CT ABDOMEN AND PELVIS WITH CONTRAST TECHNIQUE: Multidetector CT imaging of the chest was performed using the standard protocol during bolus administration of intravenous contrast. Multiplanar CT image reconstructions and MIPs were obtained to evaluate the vascular anatomy. Multidetector CT imaging of the abdomen and pelvis was performed using the standard protocol during bolus administration of intravenous contrast. CONTRAST:  75 mL of Omnipaque  350 intravenously. COMPARISON:  March 28, 2020. FINDINGS: CTA CHEST FINDINGS Cardiovascular: Satisfactory opacification of the pulmonary arteries to the segmental level. No evidence of pulmonary embolism. Moderate cardiomegaly is noted. Moderate left atrial enlargement is noted. Atherosclerosis of thoracic aorta is noted without aneurysm formation. No pericardial effusion. Mediastinum/Nodes: No enlarged mediastinal, hilar, or axillary lymph nodes. Thyroid gland, trachea, and esophagus demonstrate no significant findings. Lungs/Pleura: Large right pleural effusion is noted which appears to be loculated. Moderate left pleural effusion is noted. Atelectasis of both lower lobes is noted. No pneumothorax is noted. Musculoskeletal: No chest wall abnormality. No acute or significant osseous findings. Review of the MIP images confirms the above findings. CT ABDOMEN and PELVIS FINDINGS Hepatobiliary: Status post cholecystectomy. No biliary dilatation is noted. Stable left hepatic cysts are noted. Pancreas: Unremarkable. No pancreatic ductal dilatation or surrounding inflammatory changes. Spleen: Normal in size without focal abnormality. Adrenals/Urinary Tract: Adrenal glands are unremarkable. Kidneys are normal, without renal calculi, focal lesion, or hydronephrosis. Bladder is unremarkable. Stomach/Bowel: The stomach appears normal. There is no  evidence of bowel obstruction or inflammation. Ostomy is seen in the right lower quadrant. Status post subtotal colectomy. Vascular/Lymphatic: Aortic atherosclerosis. No enlarged abdominal or pelvic lymph nodes. Reproductive: Uterus and bilateral adnexa are unremarkable. Other: Mild ascites is noted in the abdomen and pelvis. No hernia is noted. Musculoskeletal: No acute or significant osseous findings. Review of the MIP images confirms the above findings. IMPRESSION: 1. No definite evidence of pulmonary embolus. 2. Large right pleural effusion is noted which appears to be loculated. 3. Moderate left pleural effusion is noted. 4. Atelectasis of both lower lobes is noted. 5. Mild ascites is noted in the abdomen and pelvis. 6. Status post subtotal colectomy. Ostomy is seen in the right lower quadrant. 7. Aortic atherosclerosis. Aortic Atherosclerosis (ICD10-I70.0). Electronically Signed   By: Lupita Raider M.D.   On: 17-May-2020 13:53   CT ABDOMEN PELVIS W CONTRAST  Result Date: 2020/05/17 CLINICAL DATA:  Shortness of breath, acute generalized abdominal pain. EXAM: CT ANGIOGRAPHY CHEST CT ABDOMEN AND PELVIS WITH CONTRAST TECHNIQUE: Multidetector CT imaging of the chest was performed using the standard protocol during bolus administration of intravenous contrast. Multiplanar CT image reconstructions and MIPs were obtained to evaluate the vascular anatomy. Multidetector CT imaging of the abdomen and pelvis was performed using the standard protocol during bolus administration of intravenous contrast. CONTRAST:  75 mL of Omnipaque 350 intravenously. COMPARISON:  March 28, 2020. FINDINGS: CTA CHEST FINDINGS Cardiovascular: Satisfactory opacification of the pulmonary arteries to the segmental level. No evidence of pulmonary embolism. Moderate cardiomegaly is noted. Moderate left atrial enlargement is noted. Atherosclerosis of thoracic aorta is noted without aneurysm formation. No pericardial effusion.  Mediastinum/Nodes: No enlarged mediastinal, hilar, or axillary lymph nodes. Thyroid gland, trachea, and esophagus demonstrate no significant findings. Lungs/Pleura: Large right pleural effusion is noted which appears to be loculated. Moderate left pleural effusion is noted. Atelectasis of both lower lobes is noted. No pneumothorax is noted. Musculoskeletal: No chest wall abnormality. No acute or significant osseous findings. Review of the MIP images confirms the above findings. CT ABDOMEN and PELVIS FINDINGS Hepatobiliary: Status post cholecystectomy. No biliary dilatation is noted. Stable left hepatic cysts are noted. Pancreas: Unremarkable. No pancreatic ductal dilatation or surrounding inflammatory changes. Spleen: Normal in size without focal abnormality. Adrenals/Urinary Tract: Adrenal glands are unremarkable. Kidneys are normal, without renal calculi, focal lesion, or hydronephrosis. Bladder is unremarkable. Stomach/Bowel: The stomach appears normal. There is no evidence of bowel obstruction or  inflammation. Ostomy is seen in the right lower quadrant. Status post subtotal colectomy. Vascular/Lymphatic: Aortic atherosclerosis. No enlarged abdominal or pelvic lymph nodes. Reproductive: Uterus and bilateral adnexa are unremarkable. Other: Mild ascites is noted in the abdomen and pelvis. No hernia is noted. Musculoskeletal: No acute or significant osseous findings. Review of the MIP images confirms the above findings. IMPRESSION: 1. No definite evidence of pulmonary embolus. 2. Large right pleural effusion is noted which appears to be loculated. 3. Moderate left pleural effusion is noted. 4. Atelectasis of both lower lobes is noted. 5. Mild ascites is noted in the abdomen and pelvis. 6. Status post subtotal colectomy. Ostomy is seen in the right lower quadrant. 7. Aortic atherosclerosis. Aortic Atherosclerosis (ICD10-I70.0). Electronically Signed   By: Lupita Raider M.D.   On: 05/29/2020 13:53   DG Chest Port  1 View  Result Date: 05/04/2020 CLINICAL DATA:  Recurrent right pleural effusion. EXAM: PORTABLE CHEST 1 VIEW COMPARISON:  May 03, 2020 FINDINGS: A right pigtail catheter is been placed on the right in the interval. The right-sided pleural effusions seen on yesterday's chest x-ray is no longer visualized. There is a moderate left-sided pleural effusion, larger in the interval. Possible pulmonary venous congestion/mild edema. No other acute interval changes. IMPRESSION: 1. Placement of a right-sided pigtail catheter. No right-sided effusion seen on today's study. 2. There is a moderate left pleural effusion, larger in the interval. 3. Probable mild edema. Electronically Signed   By: Gerome Sam III M.D   On: 05/04/2020 16:23   DG Chest Port 1 View  Result Date: 05/13/2020 CLINICAL DATA:  Short of breath.  Reported recent thoracentesis. EXAM: PORTABLE CHEST 1 VIEW COMPARISON:  05/12/2020 at 3:25 a.m., and older studies. FINDINGS: Moderate bilateral pleural effusions obscure the hemidiaphragms and portions of the heart borders, unchanged from the earlier study. There is additional opacity at the lung bases consistent with atelectasis. No convincing pulmonary edema.  No pneumothorax. No mediastinal or hilar masses. IMPRESSION: 1. No interval change from the study obtained earlier today. 2. Moderate bilateral pleural effusions with associated lung base opacity, latter finding consistent with atelectasis. Consider pneumonia if there are consistent clinical findings. No convincing pulmonary edema. Electronically Signed   By: Amie Portland M.D.   On: 06/01/2020 13:18   DG Chest Port 1 View  Result Date: 04/24/2020 CLINICAL DATA:  Left thoracentesis. EXAM: PORTABLE CHEST 1 VIEW COMPARISON:  Chest x-ray 04/23/2020. FINDINGS: Cardiomegaly and pulmonary venous congestion again noted. Bilateral interstitial prominence noted. Low lung volumes with bibasilar atelectasis. Small left pleural effusion, improved  from prior exam. No evidence of pneumothorax post thoracentesis. IMPRESSION: 1. No evidence of pneumothorax post thoracentesis. 2. Cardiomegaly with pulmonary venous congestion and bilateral interstitial prominence again noted. Small left pleural effusion, improved from prior exam. Electronically Signed   By: Maisie Fus  Register   On: 04/24/2020 12:29   DG Chest Port 1 View  Result Date: 04/23/2020 CLINICAL DATA:  Status post thoracentesis EXAM: PORTABLE CHEST 1 VIEW COMPARISON:  April 21, 2020 FINDINGS: No pneumothorax. There are fairly small pleural effusions bilaterally with atelectasis and consolidation in the left lower lobe. Heart is mildly enlarged with pulmonary vascularity normal. No adenopathy. No bone lesions. IMPRESSION: No pneumothorax. Fairly small pleural effusions bilaterally with atelectasis and questionable superimposed pneumonia left lower lobe. Stable cardiac silhouette. Electronically Signed   By: Bretta Bang III M.D.   On: 04/23/2020 12:05   DG Chest Portable 1 View  Result Date: 04/18/2020 CLINICAL DATA:  Chest  pain and shortness of breath EXAM: PORTABLE CHEST 1 VIEW COMPARISON:  Seven days ago FINDINGS: Small to moderate bilateral pleural effusion. The lower lobes are partially obscured. Possible cephalized blood flow. No Kerley lines. Cardiomegaly. IMPRESSION: Cardiomegaly with small to moderate pleural effusions and mild vascular congestion. Electronically Signed   By: Marnee Spring M.D.   On: 04/18/2020 06:16   DG Chest Port 1 View  Result Date: 04/09/2020 CLINICAL DATA:  Shortness of breath EXAM: PORTABLE CHEST 1 VIEW COMPARISON:  Chest x-ray dated 03/28/2020 FINDINGS: There is cardiomegaly with evidence for congestive heart failure. There are moderate to large bilateral pleural effusions, right greater than left. There is no pneumothorax. Bibasilar airspace disease is noted and favored to represent atelectasis. IMPRESSION: 1. Cardiomegaly with evidence for  congestive heart failure. 2. Moderate to large bilateral pleural effusions, right greater than left. Electronically Signed   By: Katherine Mantle M.D.   On: 04/09/2020 00:27   ECHOCARDIOGRAM COMPLETE  Result Date: 05/04/2020    ECHOCARDIOGRAM REPORT   Patient Name:   [name] Date of Exam: 05/04/2020 Medical Rec #:  [id number]       Height:       65.0 in Accession #:    [id number]      Weight:       159.2 lb Date of Birth:  30-Apr-1946        BSA:          1.795 m Patient Age:    74 years        BP:           110/88 mmHg Patient Gender: F               HR:           112 bpm. Exam Location:  [facility] Procedure: 2D Echo, Color Doppler and Cardiac Doppler Indications:     Shock  History:         Patient has prior history of Echocardiogram examinations, most                  recent 12/06/2019. CHF, Stroke, Arrythmias:Atrial Fibrillation;                  Risk Factors:Hypertension.  Sonographer:     Humphrey Rolls RDCS (AE) Referring Phys:  [id number] Judithe Modest Diagnosing Phys: Alwyn Pea MD  Sonographer Comments: Suboptimal apical window and no subcostal window. IMPRESSIONS  1. Left ventricular ejection fraction, by estimation, is 35 to 40%. The left ventricle has moderately decreased function. The left ventricle demonstrates regional wall motion abnormalities (see scoring diagram/findings for description). The left ventricular internal cavity size was mildly dilated. There is mild left ventricular hypertrophy. Left ventricular diastolic parameters are consistent with Grade I diastolic dysfunction (impaired relaxation).  2. Right ventricular systolic function is moderately reduced. The right ventricular size is moderately enlarged. There is severely elevated pulmonary artery systolic pressure.  3. Left atrial size was severely dilated.  4. Right atrial size was moderately dilated.  5. Large pericardial effusion.  6. The mitral valve is grossly normal. Mild to moderate mitral valve regurgitation. No evidence of  mitral stenosis.  7. Tricuspid valve regurgitation is severe.  8. The aortic valve is grossly normal. Aortic valve regurgitation is trivial. Mild to moderate aortic valve sclerosis/calcification is present, without any evidence of aortic stenosis. FINDINGS  Left Ventricle: Left ventricular ejection fraction, by estimation, is 35 to 40%. The left ventricle has moderately decreased function.  The left ventricle demonstrates regional wall motion abnormalities. The left ventricular internal cavity size was mildly dilated. There is mild left ventricular hypertrophy. Abnormal (paradoxical) septal motion, consistent with left bundle branch block. Left ventricular diastolic parameters are consistent with Grade I diastolic dysfunction (impaired relaxation). Right Ventricle: The right ventricular size is moderately enlarged. No increase in right ventricular wall thickness. Right ventricular systolic function is moderately reduced. There is severely elevated pulmonary artery systolic pressure. Left Atrium: Left atrial size was severely dilated. Right Atrium: Right atrial size was moderately dilated. Pericardium: A large pericardial effusion is present. Mitral Valve: The mitral valve is grossly normal. There is mild thickening of the mitral valve leaflet(s). There is mild calcification of the mitral valve leaflet(s). Normal mobility of the mitral valve leaflets. Mild mitral annular calcification. Mild to moderate mitral valve regurgitation. No evidence of mitral valve stenosis. MV peak gradient, 12.4 mmHg. The mean mitral valve gradient is 6.0 mmHg. Tricuspid Valve: The tricuspid valve is grossly normal. Tricuspid valve regurgitation is severe. Aortic Valve: The aortic valve is grossly normal. Aortic valve regurgitation is trivial. Mild to moderate aortic valve sclerosis/calcification is present, without any evidence of aortic stenosis. Aortic valve mean gradient measures 12.0 mmHg. Aortic valve peak gradient measures 20.1 mmHg.  Aortic valve area, by VTI measures 0.86 cm. Pulmonic Valve: The pulmonic valve was grossly normal. Pulmonic valve regurgitation is mild. Aorta: The aortic arch was not well visualized. IAS/Shunts: No atrial level shunt detected by color flow Doppler.  LEFT VENTRICLE PLAX 2D LVIDd:         5.13 cm  Diastology LVIDs:         3.38 cm  LV e' medial:    11.60 cm/s LV PW:         1.03 cm  LV E/e' medial:  13.7 LV IVS:        0.77 cm  LV e' lateral:   8.16 cm/s LVOT diam:     2.30 cm  LV E/e' lateral: 19.5 LV SV:         34 LV SV Index:   19 LVOT Area:     4.15 cm  RIGHT VENTRICLE RV Basal diam:  3.85 cm LEFT ATRIUM              Index       RIGHT ATRIUM           Index LA diam:        6.50 cm  3.62 cm/m  RA Area:     30.80 cm LA Vol (A2C):   74.2 ml  41.33 ml/m RA Volume:   103.00 ml 57.38 ml/m LA Vol (A4C):   119.0 ml 66.29 ml/m LA Biplane Vol: 104.0 ml 57.93 ml/m  AORTIC VALVE                    PULMONIC VALVE AV Area (Vmax):    1.20 cm     PV Vmax:       1.13 m/s AV Area (Vmean):   1.00 cm     PV Vmean:      70.500 cm/s AV Area (VTI):     0.86 cm     PV VTI:        0.149 m AV Vmax:           224.00 cm/s  PV Peak grad:  5.1 mmHg AV Vmean:          160.000 cm/s PV Mean grad:  2.0 mmHg  AV VTI:            0.401 m AV Peak Grad:      20.1 mmHg AV Mean Grad:      12.0 mmHg LVOT Vmax:         64.90 cm/s LVOT Vmean:        38.600 cm/s LVOT VTI:          0.083 m LVOT/AV VTI ratio: 0.21  AORTA Ao Root diam: 2.40 cm MITRAL VALVE                TRICUSPID VALVE MV Area (PHT): 4.97 cm     TR Peak grad:   37.7 mmHg MV Peak grad:  12.4 mmHg    TR Vmax:        307.00 cm/s MV Mean grad:  6.0 mmHg MV Vmax:       1.76 m/s     SHUNTS MV Vmean:      113.0 cm/s   Systemic VTI:  0.08 m MV Decel Time: 153 msec     Systemic Diam: 2.30 cm MV E velocity: 159.33 cm/s Alwyn Pea MD Electronically signed by Alwyn Pea MD Signature Date/Time: 05/04/2020/3:12:58 PM    Final    US THORACENTESIS ASP PLEURAL SPACE W/IMG  GUIDE  Result Date: 04/24/2020 INDICATION: Patient with history of congestive heart failure, chronic atrial fibrillation, prior CVA, recent subtotal colectomy and ileostomy, bilateral pleural effusions, dyspnea ; status post right thoracentesis on 04/23/20 ; request now received for diagnostic and therapeutic left thoracentesis. EXAM: ULTRASOUND GUIDED DIAGNOSTIC AND THERAPEUTIC LEFT THORACENTESIS MEDICATIONS: 1% lidocaine to skin and subcutaneous tissue COMPLICATIONS: None immediate. PROCEDURE: An ultrasound guided thoracentesis was thoroughly discussed with the patient and questions answered. The benefits, risks, alternatives and complications were also discussed. The patient understands and wishes to proceed with the procedure. Written consent was obtained. Ultrasound was performed to localize and mark an adequate pocket of fluid in the left chest. The area was then prepped and draped in the normal sterile fashion. 1% Lidocaine was used for local anesthesia. Under ultrasound guidance a 6 Fr Safe-T-Centesis catheter was introduced. Thoracentesis was performed. The catheter was removed and a dressing applied. FINDINGS: A total of approximately 750 cc of yellow fluid was removed. Samples were sent to the laboratory as requested by the clinical team. IMPRESSION: Successful ultrasound guided diagnostic and therapeutic left thoracentesis yielding 750 cc of pleural fluid. Read by: Jeananne Rama, PA-C Electronically Signed   By: Elige Ko   On: 04/24/2020 12:07   US THORACENTESIS ASP PLEURAL SPACE W/IMG GUIDE  Result Date: 04/23/2020 INDICATION: Patient with history chronic respiratory failure, AFib, CHF with shortness of breath. Found to have pleural effusion. Request is for therapeutic and diagnostic thoracentesis. EXAM: ULTRASOUND GUIDED THERAPEUTIC AND DIAGNOSTIC THORACENTESIS MEDICATIONS: Lidocaine 1% 10 mL COMPLICATIONS: None immediate. PROCEDURE: An ultrasound guided thoracentesis was thoroughly  discussed with the patient and questions answered. The benefits, risks, alternatives and complications were also discussed. The patient understands and wishes to proceed with the procedure. Written consent was obtained. Ultrasound was performed to localize and mark an adequate pocket of fluid in the right chest. The area was then prepped and draped in the normal sterile fashion. 1% Lidocaine was used for local anesthesia. Under ultrasound guidance a 6 Fr Safe-T-Centesis catheter was introduced. Thoracentesis was performed. The catheter was removed and a dressing applied. FINDINGS: A total of approximately 1 L of straw-colored fluid was removed. Samples were sent to the laboratory as  requested by the clinical team. IMPRESSION: Successful ultrasound guided therapeutic and diagnostic right sided thoracentesis yielding 1 L of pleural fluid. Read by: Anders Grant, NP Electronically Signed   By: Simonne Come M.D.   On: 04/23/2020 12:12    Consults: Palliative Care  Subjective:    Overnight Issues: Patient is now transition to comfort care.  Appears comfortable.  Respirations becoming more irregular.  Objective:  Vital signs for last 24 hours: Temp:  [98.5 F (36.9 C)-98.8 F (37.1 C)] 98.8 F (37.1 C) (12/04 0800) Pulse Rate:  [92-127] 111 (12/04 1100) Resp:  [2-30] 9 (12/04 1100) BP: (95-132)/(58-98) 96/68 (12/03 2100) SpO2:  [96 %-100 %] 99 % (12/04 1100)  Hemodynamic parameters for last 24 hours:    Intake/Output from previous day: 12/03 0701 - 12/04 0700 In: 2315.5 [I.V.:1791.2; Blood:198; IV Piggyback:326.2] Out: 1910 [Urine:340; Chest Tube:1570]  Intake/Output this shift: Total I/O In: 69.5 [I.V.:69.5] Out: 80 [Urine:20; Chest Tube:60]  Vent settings for last 24 hours:    Physical Exam:  GENERAL: Chronically ill-appearing woman, comfortable, on morphine infusion 5 mg/hour HEAD: Normocephalic, atraumatic.  MOUTH: Edentulous, oral mucosa moist.  No thrush. PULMONARY: Coarse  breath sounds throughout.  Clear on right.  Diminished breath sounds on the left base.  Chest tube with a total of 1600 mL drainage as yesterday CARDIOVASCULAR: S1 and S2.  Tachycardic rate with regular rhythm.  Grade 3/6 mitral regurg murmur. ABDOMEN: Ileostomy in place.  Mildly distended, soft, normoactive bowel sounds. MUSCULOSKELETAL: No joint deformity, no clubbing, no edema.  NEUROLOGIC: Unresponsive. SKIN: Notable petechiae or ecchymoses particularly in dependent areas.   Assessment/Plan:   Cardiogenic shock with lactic acidosis Evidence of biventricular failure SEVERE pulmonary hypertension SEVERE tricuspid regurgitation LVEF 35% down from 55% Right ventricular systolic function declined from prior Weaned off of pressor after right chest drainage Bilateral pleural effusions due to right-sided failure Patient now DNR, transition to comfort measures  Bilateral pleural effusions RIGHT greater than left Concern for right pleural effusion loculation Chest tube placed Effusion free-flowing 1600 mL total since placement yesterday Parameters appear transudative consistent with above diagnoses Patient has been transitioned to comfort measures  ID Procalcitonin 2.0 Cannot exclude underlying pneumonia Discontinued antibiotics as patient transition to comfort measures  Hypoglycemia Resolved  Acute kidney injury Suspect cardiorenal syndrome  Persistent lactic acidosis Due to cardiogenic shock Poor prognostic sign  Coagulopathy Eliquis discontinued Received FFP Vitamin K x1  Acute encephalopathy Metabolic Has underlying cognitive decline Prior history of encephalitis   LOS: 2 days   Patient has been transitioned to comfort care.  Will transfer to general medical floor.  Critical Care Total Time*: Level 2 follow-up  C. Danice Goltz, MD  PCCM 05/15/2020  *This note was dictated using voice recognition software/Dragon.  Despite best efforts to proofread,  errors can occur which can change the meaning.  Any change was purely unintentional.

## 2020-05-05 NOTE — Progress Notes (Signed)
Pt moved to 1C for comfort care measures, Son [name] updated, pt moved on ICU bed, report called to [facility]

## 2020-05-06 DIAGNOSIS — I5082 Biventricular heart failure: Secondary | ICD-10-CM | POA: Diagnosis present

## 2020-05-06 DIAGNOSIS — I272 Pulmonary hypertension, unspecified: Secondary | ICD-10-CM | POA: Diagnosis present

## 2020-05-06 DIAGNOSIS — D689 Coagulation defect, unspecified: Secondary | ICD-10-CM | POA: Diagnosis present

## 2020-05-06 DIAGNOSIS — N179 Acute kidney failure, unspecified: Secondary | ICD-10-CM | POA: Diagnosis present

## 2020-05-06 DIAGNOSIS — G9340 Encephalopathy, unspecified: Secondary | ICD-10-CM | POA: Diagnosis present

## 2020-05-06 DIAGNOSIS — I1310 Hypertensive heart and chronic kidney disease without heart failure, with stage 1 through stage 4 chronic kidney disease, or unspecified chronic kidney disease: Secondary | ICD-10-CM | POA: Diagnosis present

## 2020-05-06 DIAGNOSIS — E162 Hypoglycemia, unspecified: Secondary | ICD-10-CM | POA: Diagnosis present

## 2020-05-06 DIAGNOSIS — R570 Cardiogenic shock: Secondary | ICD-10-CM | POA: Diagnosis present

## 2020-05-06 DIAGNOSIS — E872 Acidosis, unspecified: Secondary | ICD-10-CM | POA: Diagnosis present

## 2020-05-07 ENCOUNTER — Encounter: Payer: Medicare Other | Admitting: Physician Assistant

## 2020-05-08 LAB — CULTURE, BLOOD (ROUTINE X 2)
Culture: NO GROWTH
Culture: NO GROWTH
Special Requests: ADEQUATE
Special Requests: ADEQUATE

## 2020-05-08 LAB — PROTEIN, BODY FLUID (OTHER): Total Protein, Body Fluid Other: 1.8 g/dL

## 2020-05-08 LAB — HISTOPLASMA ANTIGEN, URINE: Histoplasma Antigen, urine: <0.5 (ref <0.5)

## 2020-05-08 LAB — BODY FLUID CULTURE: Culture: NO GROWTH

## 2020-05-08 LAB — LEGIONELLA PNEUMOPHILA SEROGP 1 UR AG: L. pneumophila Serogp 1 Ur Ag: NEGATIVE

## 2020-05-18 LAB — BLOOD GAS, ARTERIAL
Acid-base deficit: 13.3 mmol/L — ABNORMAL HIGH (ref 0.0–2.0)
Bicarbonate: 14.2 mmol/L — ABNORMAL LOW (ref 20.0–28.0)
O2 Saturation: 17.3 %
Patient temperature: 37
pCO2 arterial: 39 mmHg (ref 32.0–48.0)
pH, Arterial: 7.17 — CL (ref 7.350–7.450)

## 2020-06-02 NOTE — Death Summary Note (Signed)
DEATH SUMMARY   Patient Details  Name: [name] MRN: [id number] DOB: 1946/04/16  Admission/Discharge Information   Admit Date:  05-22-2020  Date of Death: Date of Death: 05-24-2020  Time of Death: Time of Death: 1904/10/19  Length of Stay: 3  Referring Physician: Mickel Fuchs, MD   Reason(s) for Hospitalization  Altered mental status  Diagnoses  Preliminary cause of death:  Secondary Diagnoses (including complications and co-morbidities):  Active Problems:   Bilateral pleural effusion   Cardiogenic shock (HCC)   Biventricular CHF (congestive heart failure) (HCC)   Pulmonary hypertension (HCC)   Lactic acidosis   Acute kidney injury (HCC)   Cardiorenal syndrome with renal failure   Coagulopathy (HCC)   Acute encephalopathy   Hypoglycemia   Brief Hospital Course (including significant findings, care, treatment, and services provided and events leading to death)  [name] is a 75 y.o. year old female resident of [facility] who presented on 22-May-2020 for altered mental status.  She was noted to be in circulatory shock and presented with bilateral pneumonia.  Initial thought was that the patient had bilateral pneumonia with parapneumonic effusions and septic shock.  SEPSIS however was RULED OUT.  The patient presented with acute encephalopathy, severe lactic acidosis and hypoglycemia.  He required fluids and pressors.  She was placed initially on broad-spectrum antibiotics.  She was admitted to the critical care service due to need for vasopressors.  The patient remained with altered mentation.  Hypoglycemia was corrected by supplemental glucose.  She was noted to be in respiratory distress due to bilateral pleural effusions.  Initially there was thought that the right pleural effusion was loculated and could be an empyema.  A pigtail catheter was placed and revealed that the fluid was transudative consistent with congestive heart failure.  Subsequently 2D echo  revealed that the patient had an EF of 35% with moderate decrease left ventricular function grade 1 diastolic dysfunction, reduced right ventricular systolic function severely elevated pulmonary artery systolic pressure with moderate mitral valve regurgitation and severe tricuspid regurgitation.  Natruretic peptide was 1764.  The patient was also noted to be coagulopathic she had been on outpatient Eliquis for paroxysmal atrial fibrillation.  Compared to prior echocardiogram performed in July 2021 this was a significant change with a significant decrease in both left and right ventricular function.  She was were negative for pathogens.  The etiology of the patient's decompensation was likely due to acute biventricular failure with cardiogenic shock which explained physiologic abnormalities.  Discussions with family were had.  Palliative Care was consulted it was noted that the patient had been in a significant state of decline for approximately a year and a half.  The family decided to transition the patient to comfort care.  The patient was transitioned to comfort care on December 2021 and expired on May 24, 2020 at 1906 hrs.  Pertinent Labs and Studies  Significant Diagnostic Studies CT ABDOMEN PELVIS WO CONTRAST  Result Date: 05/04/2020 CLINICAL DATA:  Sepsis pneumonia, worsening lactic acidosis EXAM: CT ABDOMEN AND PELVIS WITHOUT CONTRAST TECHNIQUE: Multidetector CT imaging of the abdomen and pelvis was performed following the standard protocol without IV contrast. Sagittal and coronal MPR images reconstructed from axial data set. No oral contrast administered. Beam hardening artifacts from inclusion of patient's RIGHT arm and imaged field. COMPARISON:  May 22, 2020 FINDINGS: Lower chest: BILATERAL pleural effusions with compressive atelectasis of lower lobes, RIGHT greater than LEFT little changed. Linear scarring RIGHT middle lobe. Hepatobiliary: Gallbladder  surgically absent. Low-attenuation foci  anteriorly in lateral segment LEFT lobe liver consistent with cysts, stable. No additional hepatic abnormalities. Pancreas: Mildly atrophic without focal mass Spleen: Normal appearance Adrenals/Urinary Tract: Thickening of adrenal glands without mass. Excreted contrast in renal collecting systems. Renal cortical atrophy without mass. No ureteral dilatation. Bladder decompressed by Foley catheter. Stomach/Bowel: Ileostomy RIGHT mid abdomen. Stomach and bowel loops otherwise normal appearance. Vascular/Lymphatic: Enlargement of cardiac chambers. Atherosclerotic calcifications aorta, iliac arteries, femoral arteries. Mitral annular calcification. Aorta normal caliber. No adenopathy. Reproductive: Unremarkable uterus and ovaries Other: Scattered ascites.  No free air.  No hernia. Musculoskeletal: Osseous demineralization. Scattered subcutaneous edema. IMPRESSION: BILATERAL pleural effusions and compressive atelectasis of lower lobes, RIGHT greater than LEFT, little changed. Scattered ascites. No additional significant intra-abdominal or intrapelvic abnormalities. Aortic Atherosclerosis (ICD10-I70.0). Electronically Signed   By: Ulyses Southward M.D.   On: 05/04/2020 09:59   DG Chest 2 View  Result Date: 05/04/2020 CLINICAL DATA:  Shortness of breath EXAM: CHEST - 2 VIEW COMPARISON:  04/24/2020 FINDINGS: Small pleural effusions. Right basilar opacities. Mild cardiomegaly. No pneumothorax. IMPRESSION: Small bilateral pleural effusions with right basilar opacities, possibly pneumonia or atelectasis. Electronically Signed   By: Deatra Robinson M.D.   On: 05/22/2020 03:51   DG Chest 2 View  Result Date: 04/21/2020 CLINICAL DATA:  Shortness of breath, pleural effusion EXAM: CHEST - 2 VIEW COMPARISON:  04/18/2020 FINDINGS: No significant interval change in chest radiographs with moderate, layering bilateral pleural effusions and diffuse interstitial pulmonary opacity. Cardiomegaly. IMPRESSION: No significant interval change  in chest radiographs with moderate, layering bilateral pleural effusions and diffuse interstitial pulmonary opacity, likely edema. Electronically Signed   By: Lauralyn Primes M.D.   On: 04/21/2020 15:31   DG Chest 2 View  Result Date: 04/11/2020 CLINICAL DATA:  75 year old female with a history congestive heart failure EXAM: CHEST - 2 VIEW COMPARISON:  04/09/2020, CT chest 03/28/2020 FINDINGS: Cardiomediastinal silhouette unchanged with the heart borders partially obscured by overlying lung and pleural disease. Opacity at the bilateral lung bases partially obscuring the hemidiaphragm and heart borders. No pneumothorax. Opacity at the posterior lung base on the lateral view. Mild interlobular septal thickening. IMPRESSION: Bilateral pleural effusions with associated atelectasis/consolidation. Electronically Signed   By: Gilmer Mor D.O.   On: 04/11/2020 08:10   CT Head Wo Contrast  Result Date: 05/28/2020 CLINICAL DATA:  Altered mental status EXAM: CT HEAD WITHOUT CONTRAST TECHNIQUE: Contiguous axial images were obtained from the base of the skull through the vertex without intravenous contrast. COMPARISON:  January 13, 2018 head CT; brain MRI February 12, 2018 FINDINGS: Brain: There is a degree of stable age related volume loss. Right lateral ventricle is slightly larger than the left lateral ventricle, likely due to encephalomalacia based on prior infarcts on the right. There is no evident intracranial mass, hemorrhage, extra-axial fluid collection, or midline shift. There is evidence of a sizable right inferior to mid frontal and anterior in the mid right temporal lobe infarct, stable. Elsewhere, there is patchy small vessel disease in the centra semiovale bilaterally. No acute infarct is demonstrable. Vascular: No hyperdense vessel. There is mild calcification in the carotid siphon regions. Skull: Bony calvarium appears intact. Sinuses/Orbits: Visualized paranasal sinuses are clear. There is rightward  deviation of the nasal septum. Orbits appear symmetric bilaterally. Other: Visualized mastoid air cells are clear. There is debris in the right external auditory canal. IMPRESSION: Prior right frontal and temporal lobe infarcts, stable. Age related volume loss with patchy periventricular small vessel  disease is stable. No acute infarct. No mass or hemorrhage. There is mild arterial vascular calcification. There is rightward deviation of the nasal septum. There is probable cerumen in the right external auditory canal. Electronically Signed   By: Bretta Bang III M.D.   On: May 23, 2020 13:45   CT Angio Chest PE W and/or Wo Contrast  Result Date: 05-23-20 CLINICAL DATA:  Shortness of breath, acute generalized abdominal pain. EXAM: CT ANGIOGRAPHY CHEST CT ABDOMEN AND PELVIS WITH CONTRAST TECHNIQUE: Multidetector CT imaging of the chest was performed using the standard protocol during bolus administration of intravenous contrast. Multiplanar CT image reconstructions and MIPs were obtained to evaluate the vascular anatomy. Multidetector CT imaging of the abdomen and pelvis was performed using the standard protocol during bolus administration of intravenous contrast. CONTRAST:  75 mL of Omnipaque 350 intravenously. COMPARISON:  March 28, 2020. FINDINGS: CTA CHEST FINDINGS Cardiovascular: Satisfactory opacification of the pulmonary arteries to the segmental level. No evidence of pulmonary embolism. Moderate cardiomegaly is noted. Moderate left atrial enlargement is noted. Atherosclerosis of thoracic aorta is noted without aneurysm formation. No pericardial effusion. Mediastinum/Nodes: No enlarged mediastinal, hilar, or axillary lymph nodes. Thyroid gland, trachea, and esophagus demonstrate no significant findings. Lungs/Pleura: Large right pleural effusion is noted which appears to be loculated. Moderate left pleural effusion is noted. Atelectasis of both lower lobes is noted. No pneumothorax is noted.  Musculoskeletal: No chest wall abnormality. No acute or significant osseous findings. Review of the MIP images confirms the above findings. CT ABDOMEN and PELVIS FINDINGS Hepatobiliary: Status post cholecystectomy. No biliary dilatation is noted. Stable left hepatic cysts are noted. Pancreas: Unremarkable. No pancreatic ductal dilatation or surrounding inflammatory changes. Spleen: Normal in size without focal abnormality. Adrenals/Urinary Tract: Adrenal glands are unremarkable. Kidneys are normal, without renal calculi, focal lesion, or hydronephrosis. Bladder is unremarkable. Stomach/Bowel: The stomach appears normal. There is no evidence of bowel obstruction or inflammation. Ostomy is seen in the right lower quadrant. Status post subtotal colectomy. Vascular/Lymphatic: Aortic atherosclerosis. No enlarged abdominal or pelvic lymph nodes. Reproductive: Uterus and bilateral adnexa are unremarkable. Other: Mild ascites is noted in the abdomen and pelvis. No hernia is noted. Musculoskeletal: No acute or significant osseous findings. Review of the MIP images confirms the above findings. IMPRESSION: 1. No definite evidence of pulmonary embolus. 2. Large right pleural effusion is noted which appears to be loculated. 3. Moderate left pleural effusion is noted. 4. Atelectasis of both lower lobes is noted. 5. Mild ascites is noted in the abdomen and pelvis. 6. Status post subtotal colectomy. Ostomy is seen in the right lower quadrant. 7. Aortic atherosclerosis. Aortic Atherosclerosis (ICD10-I70.0). Electronically Signed   By: Lupita Raider M.D.   On: 05-23-2020 13:53   CT ABDOMEN PELVIS W CONTRAST  Result Date: 2020/05/23 CLINICAL DATA:  Shortness of breath, acute generalized abdominal pain. EXAM: CT ANGIOGRAPHY CHEST CT ABDOMEN AND PELVIS WITH CONTRAST TECHNIQUE: Multidetector CT imaging of the chest was performed using the standard protocol during bolus administration of intravenous contrast. Multiplanar CT image  reconstructions and MIPs were obtained to evaluate the vascular anatomy. Multidetector CT imaging of the abdomen and pelvis was performed using the standard protocol during bolus administration of intravenous contrast. CONTRAST:  75 mL of Omnipaque 350 intravenously. COMPARISON:  March 28, 2020. FINDINGS: CTA CHEST FINDINGS Cardiovascular: Satisfactory opacification of the pulmonary arteries to the segmental level. No evidence of pulmonary embolism. Moderate cardiomegaly is noted. Moderate left atrial enlargement is noted. Atherosclerosis of thoracic aorta is noted without  aneurysm formation. No pericardial effusion. Mediastinum/Nodes: No enlarged mediastinal, hilar, or axillary lymph nodes. Thyroid gland, trachea, and esophagus demonstrate no significant findings. Lungs/Pleura: Large right pleural effusion is noted which appears to be loculated. Moderate left pleural effusion is noted. Atelectasis of both lower lobes is noted. No pneumothorax is noted. Musculoskeletal: No chest wall abnormality. No acute or significant osseous findings. Review of the MIP images confirms the above findings. CT ABDOMEN and PELVIS FINDINGS Hepatobiliary: Status post cholecystectomy. No biliary dilatation is noted. Stable left hepatic cysts are noted. Pancreas: Unremarkable. No pancreatic ductal dilatation or surrounding inflammatory changes. Spleen: Normal in size without focal abnormality. Adrenals/Urinary Tract: Adrenal glands are unremarkable. Kidneys are normal, without renal calculi, focal lesion, or hydronephrosis. Bladder is unremarkable. Stomach/Bowel: The stomach appears normal. There is no evidence of bowel obstruction or inflammation. Ostomy is seen in the right lower quadrant. Status post subtotal colectomy. Vascular/Lymphatic: Aortic atherosclerosis. No enlarged abdominal or pelvic lymph nodes. Reproductive: Uterus and bilateral adnexa are unremarkable. Other: Mild ascites is noted in the abdomen and pelvis. No hernia  is noted. Musculoskeletal: No acute or significant osseous findings. Review of the MIP images confirms the above findings. IMPRESSION: 1. No definite evidence of pulmonary embolus. 2. Large right pleural effusion is noted which appears to be loculated. 3. Moderate left pleural effusion is noted. 4. Atelectasis of both lower lobes is noted. 5. Mild ascites is noted in the abdomen and pelvis. 6. Status post subtotal colectomy. Ostomy is seen in the right lower quadrant. 7. Aortic atherosclerosis. Aortic Atherosclerosis (ICD10-I70.0). Electronically Signed   By: Lupita Raider M.D.   On: 2020-05-28 13:53   DG Chest Port 1 View  Result Date: 05/04/2020 CLINICAL DATA:  Recurrent right pleural effusion. EXAM: PORTABLE CHEST 1 VIEW COMPARISON:  2020-05-28 FINDINGS: A right pigtail catheter is been placed on the right in the interval. The right-sided pleural effusions seen on yesterday's chest x-ray is no longer visualized. There is a moderate left-sided pleural effusion, larger in the interval. Possible pulmonary venous congestion/mild edema. No other acute interval changes. IMPRESSION: 1. Placement of a right-sided pigtail catheter. No right-sided effusion seen on today's study. 2. There is a moderate left pleural effusion, larger in the interval. 3. Probable mild edema. Electronically Signed   By: Gerome Sam III M.D   On: 05/04/2020 16:23   DG Chest Port 1 View  Result Date: May 28, 2020 CLINICAL DATA:  Short of breath.  Reported recent thoracentesis. EXAM: PORTABLE CHEST 1 VIEW COMPARISON:  05-28-20 at 3:25 a.m., and older studies. FINDINGS: Moderate bilateral pleural effusions obscure the hemidiaphragms and portions of the heart borders, unchanged from the earlier study. There is additional opacity at the lung bases consistent with atelectasis. No convincing pulmonary edema.  No pneumothorax. No mediastinal or hilar masses. IMPRESSION: 1. No interval change from the study obtained earlier today. 2.  Moderate bilateral pleural effusions with associated lung base opacity, latter finding consistent with atelectasis. Consider pneumonia if there are consistent clinical findings. No convincing pulmonary edema. Electronically Signed   By: Amie Portland M.D.   On: 05-28-20 13:18   DG Chest Port 1 View  Result Date: 04/24/2020 CLINICAL DATA:  Left thoracentesis. EXAM: PORTABLE CHEST 1 VIEW COMPARISON:  Chest x-ray 04/23/2020. FINDINGS: Cardiomegaly and pulmonary venous congestion again noted. Bilateral interstitial prominence noted. Low lung volumes with bibasilar atelectasis. Small left pleural effusion, improved from prior exam. No evidence of pneumothorax post thoracentesis. IMPRESSION: 1. No evidence of pneumothorax post thoracentesis. 2.  Cardiomegaly with pulmonary venous congestion and bilateral interstitial prominence again noted. Small left pleural effusion, improved from prior exam. Electronically Signed   By: Maisie Fus  Register   On: 04/24/2020 12:29   DG Chest Port 1 View  Result Date: 04/23/2020 CLINICAL DATA:  Status post thoracentesis EXAM: PORTABLE CHEST 1 VIEW COMPARISON:  April 21, 2020 FINDINGS: No pneumothorax. There are fairly small pleural effusions bilaterally with atelectasis and consolidation in the left lower lobe. Heart is mildly enlarged with pulmonary vascularity normal. No adenopathy. No bone lesions. IMPRESSION: No pneumothorax. Fairly small pleural effusions bilaterally with atelectasis and questionable superimposed pneumonia left lower lobe. Stable cardiac silhouette. Electronically Signed   By: Bretta Bang III M.D.   On: 04/23/2020 12:05   DG Chest Portable 1 View  Result Date: 04/18/2020 CLINICAL DATA:  Chest pain and shortness of breath EXAM: PORTABLE CHEST 1 VIEW COMPARISON:  Seven days ago FINDINGS: Small to moderate bilateral pleural effusion. The lower lobes are partially obscured. Possible cephalized blood flow. No Kerley lines. Cardiomegaly. IMPRESSION:  Cardiomegaly with small to moderate pleural effusions and mild vascular congestion. Electronically Signed   By: Marnee Spring M.D.   On: 04/18/2020 06:16   DG Chest Port 1 View  Result Date: 04/09/2020 CLINICAL DATA:  Shortness of breath EXAM: PORTABLE CHEST 1 VIEW COMPARISON:  Chest x-ray dated 03/28/2020 FINDINGS: There is cardiomegaly with evidence for congestive heart failure. There are moderate to large bilateral pleural effusions, right greater than left. There is no pneumothorax. Bibasilar airspace disease is noted and favored to represent atelectasis. IMPRESSION: 1. Cardiomegaly with evidence for congestive heart failure. 2. Moderate to large bilateral pleural effusions, right greater than left. Electronically Signed   By: Katherine Mantle M.D.   On: 04/09/2020 00:27   ECHOCARDIOGRAM COMPLETE  Result Date: 05/04/2020    ECHOCARDIOGRAM REPORT   Patient Name:   XIN KLAWITTER Date of Exam: 05/04/2020 Medical Rec #:  [id number]       Height:       65.0 in Accession #:    [id number]      Weight:       159.2 lb Date of Birth:  1945/09/21        BSA:          1.795 m Patient Age:    74 years        BP:           110/88 mmHg Patient Gender: F               HR:           112 bpm. Exam Location:  [facility] Procedure: 2D Echo, Color Doppler and Cardiac Doppler Indications:     Shock  History:         Patient has prior history of Echocardiogram examinations, most                  recent 12/06/2019. CHF, Stroke, Arrythmias:Atrial Fibrillation;                  Risk Factors:Hypertension.  Sonographer:     Humphrey Rolls RDCS (AE) Referring Phys:  [id number] Judithe Modest Diagnosing Phys: Alwyn Pea MD  Sonographer Comments: Suboptimal apical window and no subcostal window. IMPRESSIONS  1. Left ventricular ejection fraction, by estimation, is 35 to 40%. The left ventricle has moderately decreased function. The left ventricle demonstrates regional wall motion abnormalities (see scoring diagram/findings for  description). The left ventricular internal cavity size  was mildly dilated. There is mild left ventricular hypertrophy. Left ventricular diastolic parameters are consistent with Grade I diastolic dysfunction (impaired relaxation).  2. Right ventricular systolic function is moderately reduced. The right ventricular size is moderately enlarged. There is severely elevated pulmonary artery systolic pressure.  3. Left atrial size was severely dilated.  4. Right atrial size was moderately dilated.  5. Large pericardial effusion.  6. The mitral valve is grossly normal. Mild to moderate mitral valve regurgitation. No evidence of mitral stenosis.  7. Tricuspid valve regurgitation is severe.  8. The aortic valve is grossly normal. Aortic valve regurgitation is trivial. Mild to moderate aortic valve sclerosis/calcification is present, without any evidence of aortic stenosis. FINDINGS  Left Ventricle: Left ventricular ejection fraction, by estimation, is 35 to 40%. The left ventricle has moderately decreased function. The left ventricle demonstrates regional wall motion abnormalities. The left ventricular internal cavity size was mildly dilated. There is mild left ventricular hypertrophy. Abnormal (paradoxical) septal motion, consistent with left bundle branch block. Left ventricular diastolic parameters are consistent with Grade I diastolic dysfunction (impaired relaxation). Right Ventricle: The right ventricular size is moderately enlarged. No increase in right ventricular wall thickness. Right ventricular systolic function is moderately reduced. There is severely elevated pulmonary artery systolic pressure. Left Atrium: Left atrial size was severely dilated. Right Atrium: Right atrial size was moderately dilated. Pericardium: A large pericardial effusion is present. Mitral Valve: The mitral valve is grossly normal. There is mild thickening of the mitral valve leaflet(s). There is mild calcification of the mitral valve  leaflet(s). Normal mobility of the mitral valve leaflets. Mild mitral annular calcification. Mild to moderate mitral valve regurgitation. No evidence of mitral valve stenosis. MV peak gradient, 12.4 mmHg. The mean mitral valve gradient is 6.0 mmHg. Tricuspid Valve: The tricuspid valve is grossly normal. Tricuspid valve regurgitation is severe. Aortic Valve: The aortic valve is grossly normal. Aortic valve regurgitation is trivial. Mild to moderate aortic valve sclerosis/calcification is present, without any evidence of aortic stenosis. Aortic valve mean gradient measures 12.0 mmHg. Aortic valve peak gradient measures 20.1 mmHg. Aortic valve area, by VTI measures 0.86 cm. Pulmonic Valve: The pulmonic valve was grossly normal. Pulmonic valve regurgitation is mild. Aorta: The aortic arch was not well visualized. IAS/Shunts: No atrial level shunt detected by color flow Doppler.  LEFT VENTRICLE PLAX 2D LVIDd:         5.13 cm  Diastology LVIDs:         3.38 cm  LV e' medial:    11.60 cm/s LV PW:         1.03 cm  LV E/e' medial:  13.7 LV IVS:        0.77 cm  LV e' lateral:   8.16 cm/s LVOT diam:     2.30 cm  LV E/e' lateral: 19.5 LV SV:         34 LV SV Index:   19 LVOT Area:     4.15 cm  RIGHT VENTRICLE RV Basal diam:  3.85 cm LEFT ATRIUM              Index       RIGHT ATRIUM           Index LA diam:        6.50 cm  3.62 cm/m  RA Area:     30.80 cm LA Vol (A2C):   74.2 ml  41.33 ml/m RA Volume:   103.00 ml 57.38 ml/m LA Vol (A4C):  119.0 ml 66.29 ml/m LA Biplane Vol: 104.0 ml 57.93 ml/m  AORTIC VALVE                    PULMONIC VALVE AV Area (Vmax):    1.20 cm     PV Vmax:       1.13 m/s AV Area (Vmean):   1.00 cm     PV Vmean:      70.500 cm/s AV Area (VTI):     0.86 cm     PV VTI:        0.149 m AV Vmax:           224.00 cm/s  PV Peak grad:  5.1 mmHg AV Vmean:          160.000 cm/s PV Mean grad:  2.0 mmHg AV VTI:            0.401 m AV Peak Grad:      20.1 mmHg AV Mean Grad:      12.0 mmHg LVOT Vmax:          64.90 cm/s LVOT Vmean:        38.600 cm/s LVOT VTI:          0.083 m LVOT/AV VTI ratio: 0.21  AORTA Ao Root diam: 2.40 cm MITRAL VALVE                TRICUSPID VALVE MV Area (PHT): 4.97 cm     TR Peak grad:   37.7 mmHg MV Peak grad:  12.4 mmHg    TR Vmax:        307.00 cm/s MV Mean grad:  6.0 mmHg MV Vmax:       1.76 m/s     SHUNTS MV Vmean:      113.0 cm/s   Systemic VTI:  0.08 m MV Decel Time: 153 msec     Systemic Diam: 2.30 cm MV E velocity: 159.33 cm/s Dwayne Salome Arnt Callwood MD Electronically signed by Alwyn Peawayne D Callwood MD Signature Date/Time: 05/04/2020/3:12:58 PM    Final    US THORACENTESIS ASP PLEURAL SPACE W/IMG GUIDE  Result Date: 04/24/2020 INDICATION: Patient with history of congestive heart failure, chronic atrial fibrillation, prior CVA, recent subtotal colectomy and ileostomy, bilateral pleural effusions, dyspnea ; status post right thoracentesis on 04/23/20 ; request now received for diagnostic and therapeutic left thoracentesis. EXAM: ULTRASOUND GUIDED DIAGNOSTIC AND THERAPEUTIC LEFT THORACENTESIS MEDICATIONS: 1% lidocaine to skin and subcutaneous tissue COMPLICATIONS: None immediate. PROCEDURE: An ultrasound guided thoracentesis was thoroughly discussed with the patient and questions answered. The benefits, risks, alternatives and complications were also discussed. The patient understands and wishes to proceed with the procedure. Written consent was obtained. Ultrasound was performed to localize and mark an adequate pocket of fluid in the left chest. The area was then prepped and draped in the normal sterile fashion. 1% Lidocaine was used for local anesthesia. Under ultrasound guidance a 6 Fr Safe-T-Centesis catheter was introduced. Thoracentesis was performed. The catheter was removed and a dressing applied. FINDINGS: A total of approximately 750 cc of yellow fluid was removed. Samples were sent to the laboratory as requested by the clinical team. IMPRESSION: Successful ultrasound guided  diagnostic and therapeutic left thoracentesis yielding 750 cc of pleural fluid. Read by: Jeananne RamaKevin Allred, PA-C Electronically Signed   By: Elige KoHetal  Patel   On: 04/24/2020 12:07   US THORACENTESIS ASP PLEURAL SPACE W/IMG GUIDE  Result Date: 04/23/2020 INDICATION: Patient with history chronic respiratory failure, AFib, CHF with shortness  of breath. Found to have pleural effusion. Request is for therapeutic and diagnostic thoracentesis. EXAM: ULTRASOUND GUIDED THERAPEUTIC AND DIAGNOSTIC THORACENTESIS MEDICATIONS: Lidocaine 1% 10 mL COMPLICATIONS: None immediate. PROCEDURE: An ultrasound guided thoracentesis was thoroughly discussed with the patient and questions answered. The benefits, risks, alternatives and complications were also discussed. The patient understands and wishes to proceed with the procedure. Written consent was obtained. Ultrasound was performed to localize and mark an adequate pocket of fluid in the right chest. The area was then prepped and draped in the normal sterile fashion. 1% Lidocaine was used for local anesthesia. Under ultrasound guidance a 6 Fr Safe-T-Centesis catheter was introduced. Thoracentesis was performed. The catheter was removed and a dressing applied. FINDINGS: A total of approximately 1 L of straw-colored fluid was removed. Samples were sent to the laboratory as requested by the clinical team. IMPRESSION: Successful ultrasound guided therapeutic and diagnostic right sided thoracentesis yielding 1 L of pleural fluid. Read by: Anders Grant, NP Electronically Signed   By: Simonne Come M.D.   On: 04/23/2020 12:12    Microbiology Recent Results (from the past 240 hour(s))  Group A Strep by PCR ([facility] Only)     Status: None   Collection Time: 05/25/2020  3:28 AM   Specimen: Nasopharyngeal Swab; Sterile Swab  Result Value Ref Range Status   Group A Strep by PCR NOT DETECTED NOT DETECTED Final    Comment: Performed at [facility], [address] [place],  [place] [address]  Resp Panel by RT-PCR (Flu A&B, Covid) Nasopharyngeal Swab     Status: None   Collection Time: 05/28/2020  3:28 AM   Specimen: Nasopharyngeal Swab; Nasopharyngeal(NP) swabs in vial transport medium  Result Value Ref Range Status   SARS Coronavirus 2 by RT PCR NEGATIVE NEGATIVE Final    Comment: (NOTE) SARS-CoV-2 target nucleic acids are NOT DETECTED.  The SARS-CoV-2 RNA is generally detectable in upper respiratory specimens during the acute phase of infection. The lowest concentration of SARS-CoV-2 viral copies this assay can detect is 138 copies/mL. A negative result does not preclude SARS-Cov-2 infection and should not be used as the sole basis for treatment or other patient management decisions. A negative result may occur with  improper specimen collection/handling, submission of specimen other than nasopharyngeal swab, presence of viral mutation(s) within the areas targeted by this assay, and inadequate number of viral copies(<138 copies/mL). A negative result must be combined with clinical observations, patient history, and epidemiological information. The expected result is Negative.  Fact Sheet for Patients:  BloggerCourse.com  Fact Sheet for Healthcare Providers:  SeriousBroker.it  This test is no t yet approved or cleared by the [place] FDA and  has been authorized for detection and/or diagnosis of SARS-CoV-2 by FDA under an Emergency Use Authorization (EUA). This EUA will remain  in effect (meaning this test can be used) for the duration of the COVID-19 declaration under Section 564(b)(1) of the Act, 21 U.S.C.section 360bbb-3(b)(1), unless the authorization is terminated  or revoked sooner.       Influenza A by PCR NEGATIVE NEGATIVE Final   Influenza B by PCR NEGATIVE NEGATIVE Final    Comment: (NOTE) The Xpert Xpress SARS-CoV-2/FLU/RSV plus assay is intended as an aid in the diagnosis of influenza from  Nasopharyngeal swab specimens and should not be used as a sole basis for treatment. Nasal washings and aspirates are unacceptable for Xpert Xpress SARS-CoV-2/FLU/RSV testing.  Fact Sheet for Patients: BloggerCourse.com  Fact Sheet for Healthcare Providers: SeriousBroker.it  This test is not yet approved or cleared by the [place] and has been authorized for detection and/or diagnosis of SARS-CoV-2 by FDA under an Emergency Use Authorization (EUA). This EUA will remain in effect (meaning this test can be used) for the duration of the COVID-19 declaration under Section 564(b)(1) of the Act, 21 U.S.C. section 360bbb-3(b)(1), unless the authorization is terminated or revoked.  Performed at [facility], [address], [place], [place] [address]   Urine culture     Status: Abnormal   Collection Time: 05/31/2020  2:52 PM   Specimen: Urine, Random  Result Value Ref Range Status   Specimen Description   Final    URINE, RANDOM Performed at [facility], [address] [place], [place] [address]    Special Requests   Final    NONE Performed at [facility], [address] [place], [place] [address]    Culture (A)  Final    60,000 COLONIES/mL LACTOBACILLUS SPECIES Standardized susceptibility testing for this organism is not available. Performed at [facility], [address], [place], [place] [address]    Report Status 05/20/2020 FINAL  Final  Blood Culture (routine x 2)     Status: None (Preliminary result)   Collection Time: 05/12/2020  2:53 PM   Specimen: BLOOD  Result Value Ref Range Status   Specimen Description BLOOD BLOOD LEFT HAND  Final   Special Requests   Final    BOTTLES DRAWN AEROBIC AND ANAEROBIC Blood Culture adequate volume   Culture   Final    NO GROWTH 3 DAYS Performed at [facility], [address], [place], [place] [address]    Report Status PENDING   Incomplete  Blood Culture (routine x 2)     Status: None (Preliminary result)   Collection Time: 05/09/2020  3:05 PM   Specimen: BLOOD  Result Value Ref Range Status   Specimen Description BLOOD BLOOD RIGHT HAND  Final   Special Requests   Final    BOTTLES DRAWN AEROBIC AND ANAEROBIC Blood Culture adequate volume   Culture   Final    NO GROWTH 3 DAYS Performed at [facility], [address] [place], [place] [address]    Report Status PENDING  Incomplete  Body fluid culture     Status: None (Preliminary result)   Collection Time: 05/04/20  4:05 PM   Specimen: Pleura; Body Fluid  Result Value Ref Range Status   Specimen Description   Final    PLEURAL Performed at [facility], [address] [place], [place] [address]    Special Requests   Final    NONE Performed at [facility], [address] [place], [place] [address]    Gram Stain   Final    RARE WBC PRESENT,BOTH PMN AND MONONUCLEAR NO ORGANISMS SEEN Performed at [facility], [address] [place], [place] [address]    Culture PENDING  Incomplete   Report Status PENDING  Incomplete  MRSA PCR Screening     Status: None   Collection Time: 05/04/20  4:22 PM   Specimen: Nasopharyngeal  Result Value Ref Range Status   MRSA by PCR NEGATIVE NEGATIVE Final    Comment:        The GeneXpert MRSA Assay (FDA approved for NASAL specimens only), is one component of a comprehensive MRSA colonization surveillance program. It is not intended to diagnose MRSA infection nor to guide or monitor treatment for  MRSA infections. Performed at [facility], [address] [place], [place] [address]     Lab Basic Metabolic Panel: Recent Labs  Lab 05/25/2020 0328 05/18/2020 1340 05/04/20 0226 05/04/20 0555  NA 129* 132* 136  --   K 4.9 5.5* 5.0  --   CL 102 101 99  --   CO2 14* 10* 12*  --   GLUCOSE 118* <20* 91  --   BUN 24* 28* 31*  --   CREATININE 0.90 1.09* 1.29*  --   CALCIUM  8.7* 9.0 9.0  --   MG  --   --   --  2.1  PHOS  --   --   --  5.4*   Liver Function Tests: Recent Labs  Lab 05/13/2020 0328  AST 65*  ALT 84*  ALKPHOS 122  BILITOT 1.2  PROT 6.5  ALBUMIN 3.1*   No results for input(s): LIPASE, AMYLASE in the last 168 hours. No results for input(s): AMMONIA in the last 168 hours. CBC: Recent Labs  Lab 05/31/2020 0328 05/12/2020 1340 05/04/20 0226  WBC 10.0 12.8* 19.2*  NEUTROABS 8.2* 10.8*  --   HGB 11.6* 11.7* 11.5*  HCT 37.7 40.6 38.8  MCV 79.2* 83.0 83.1  PLT 253 276 224   Cardiac Enzymes: No results for input(s): CKTOTAL, CKMB, CKMBINDEX, TROPONINI in the last 168 hours. Sepsis Labs: Recent Labs  Lab 05/07/2020 0328 05/21/2020 0545 05/07/2020 1340 05/23/2020 1451 05/12/2020 1738 05/04/20 0100 05/04/20 0226 05/04/20 0532 05/04/20 0555 05/04/20 1013  PROCALCITON  --  <0.10  --   --  0.32  --   --   --  2.01  --   WBC 10.0  --  12.8*  --   --   --  19.2*  --   --   --   LATICACIDVEN  --   --   --    < > 9.9* >11.0*  --  >11.0*  --  >11.0*   < > = values in this interval not displayed.    Procedures/Operations  Placement of right chest tube 05/04/2020   C. Danice Goltz, MD [facility] 05/20/2020, 8:13 AM   *This note was dictated using voice recognition software/Dragon.  Despite best efforts to proofread, errors can occur which can change the meaning.  Any change was purely unintentional.

## 2020-06-02 DEATH — deceased

## 2020-07-31 ENCOUNTER — Ambulatory Visit: Payer: Medicare Other | Admitting: Family

## 2020-08-02 ENCOUNTER — Ambulatory Visit: Payer: Medicare Other | Admitting: Oncology

## 2020-08-02 ENCOUNTER — Other Ambulatory Visit: Payer: Medicare Other
